# Patient Record
Sex: Female | Born: 1960
Health system: Southern US, Community
[De-identification: ages and names within clinical notes are randomized; demographics above are authoritative.]

## PROBLEM LIST (undated history)

## (undated) DIAGNOSIS — I509 Heart failure, unspecified: Secondary | ICD-10-CM

## (undated) DIAGNOSIS — N281 Cyst of kidney, acquired: Secondary | ICD-10-CM

## (undated) DIAGNOSIS — F319 Bipolar disorder, unspecified: Secondary | ICD-10-CM

## (undated) DIAGNOSIS — M549 Dorsalgia, unspecified: Secondary | ICD-10-CM

## (undated) DIAGNOSIS — G459 Transient cerebral ischemic attack, unspecified: Secondary | ICD-10-CM

## (undated) DIAGNOSIS — I252 Old myocardial infarction: Secondary | ICD-10-CM

## (undated) DIAGNOSIS — F4310 Post-traumatic stress disorder, unspecified: Secondary | ICD-10-CM

## (undated) DIAGNOSIS — I10 Essential (primary) hypertension: Secondary | ICD-10-CM

## (undated) DIAGNOSIS — F419 Anxiety disorder, unspecified: Secondary | ICD-10-CM

## (undated) DIAGNOSIS — C801 Malignant (primary) neoplasm, unspecified: Secondary | ICD-10-CM

## (undated) DIAGNOSIS — M5134 Other intervertebral disc degeneration, thoracic region: Secondary | ICD-10-CM

## (undated) DIAGNOSIS — M5124 Other intervertebral disc displacement, thoracic region: Secondary | ICD-10-CM

## (undated) DIAGNOSIS — D649 Anemia, unspecified: Secondary | ICD-10-CM

## (undated) DIAGNOSIS — I2510 Atherosclerotic heart disease of native coronary artery without angina pectoris: Secondary | ICD-10-CM

## (undated) DIAGNOSIS — F329 Major depressive disorder, single episode, unspecified: Secondary | ICD-10-CM

## (undated) DIAGNOSIS — E785 Hyperlipidemia, unspecified: Secondary | ICD-10-CM

## (undated) DIAGNOSIS — Z87448 Personal history of other diseases of urinary system: Secondary | ICD-10-CM

## (undated) DIAGNOSIS — F1910 Other psychoactive substance abuse, uncomplicated: Secondary | ICD-10-CM

## (undated) DIAGNOSIS — F32A Depression, unspecified: Secondary | ICD-10-CM

## (undated) DIAGNOSIS — D259 Leiomyoma of uterus, unspecified: Secondary | ICD-10-CM

## (undated) DIAGNOSIS — K589 Irritable bowel syndrome without diarrhea: Secondary | ICD-10-CM

## (undated) HISTORY — DX: Cyst of kidney, acquired: N28.1

## (undated) HISTORY — DX: Anxiety disorder, unspecified: F41.9

## (undated) HISTORY — PX: COLONOSCOPY: SHX174

## (undated) HISTORY — DX: Leiomyoma of uterus, unspecified: D25.9

## (undated) HISTORY — DX: Other psychoactive substance abuse, uncomplicated: F19.10

## (undated) HISTORY — PX: OPEN REDUCTION INTERNAL FIXATION (ORIF) TIBIA/FIBULA FRACTURE: SHX5992

## (undated) HISTORY — PX: VIDEO ASSISTED THORACOSCOPY: SHX5073

## (undated) HISTORY — DX: Hyperlipidemia, unspecified: E78.5

## (undated) HISTORY — DX: Major depressive disorder, single episode, unspecified: F32.9

## (undated) HISTORY — DX: Atherosclerotic heart disease of native coronary artery without angina pectoris: I25.10

## (undated) HISTORY — PX: CARDIAC CATHETERIZATION: SHX172

## (undated) HISTORY — DX: Old myocardial infarction: I25.2

## (undated) HISTORY — DX: Dorsalgia, unspecified: M54.9

## (undated) HISTORY — DX: Transient cerebral ischemic attack, unspecified: G45.9

## (undated) HISTORY — DX: Personal history of other diseases of urinary system: Z87.448

## (undated) HISTORY — PX: LOBECTOMY: SHX5089

## (undated) HISTORY — DX: Depression, unspecified: F32.A

## (undated) HISTORY — PX: INCISION AND DRAINAGE OF WOUND: SHX1803

## (undated) HISTORY — PX: DILATION AND CURETTAGE OF UTERUS: SHX78

## (undated) HISTORY — PX: ORIF TIBIA FRACTURE: SHX5416

## (undated) HISTORY — DX: Essential (primary) hypertension: I10

---

## 2002-04-14 ENCOUNTER — Emergency Department (HOSPITAL_COMMUNITY): Admission: EM | Admit: 2002-04-14 | Discharge: 2002-04-14 | Payer: Self-pay | Admitting: Emergency Medicine

## 2002-04-14 ENCOUNTER — Encounter: Payer: Self-pay | Admitting: Emergency Medicine

## 2002-06-30 ENCOUNTER — Emergency Department (HOSPITAL_COMMUNITY): Admission: EM | Admit: 2002-06-30 | Discharge: 2002-07-01 | Payer: Self-pay | Admitting: Emergency Medicine

## 2002-08-08 ENCOUNTER — Emergency Department (HOSPITAL_COMMUNITY): Admission: EM | Admit: 2002-08-08 | Discharge: 2002-08-08 | Payer: Self-pay | Admitting: Emergency Medicine

## 2002-11-20 ENCOUNTER — Emergency Department (HOSPITAL_COMMUNITY): Admission: EM | Admit: 2002-11-20 | Discharge: 2002-11-20 | Payer: Self-pay | Admitting: Emergency Medicine

## 2002-12-14 ENCOUNTER — Emergency Department (HOSPITAL_COMMUNITY): Admission: EM | Admit: 2002-12-14 | Discharge: 2002-12-14 | Payer: Self-pay | Admitting: Emergency Medicine

## 2003-02-08 ENCOUNTER — Emergency Department (HOSPITAL_COMMUNITY): Admission: EM | Admit: 2003-02-08 | Discharge: 2003-02-08 | Payer: Self-pay | Admitting: Emergency Medicine

## 2003-03-23 ENCOUNTER — Emergency Department (HOSPITAL_COMMUNITY): Admission: EM | Admit: 2003-03-23 | Discharge: 2003-03-23 | Payer: Self-pay | Admitting: Emergency Medicine

## 2003-04-13 ENCOUNTER — Emergency Department (HOSPITAL_COMMUNITY): Admission: EM | Admit: 2003-04-13 | Discharge: 2003-04-14 | Payer: Self-pay | Admitting: Emergency Medicine

## 2003-05-22 ENCOUNTER — Encounter: Admission: RE | Admit: 2003-05-22 | Discharge: 2003-05-22 | Payer: Self-pay | Admitting: Internal Medicine

## 2003-06-05 ENCOUNTER — Encounter: Admission: RE | Admit: 2003-06-05 | Discharge: 2003-06-05 | Payer: Self-pay | Admitting: Internal Medicine

## 2003-06-20 ENCOUNTER — Encounter: Admission: RE | Admit: 2003-06-20 | Discharge: 2003-06-20 | Payer: Self-pay | Admitting: Internal Medicine

## 2003-06-26 ENCOUNTER — Ambulatory Visit (HOSPITAL_COMMUNITY): Admission: RE | Admit: 2003-06-26 | Discharge: 2003-06-26 | Payer: Self-pay | Admitting: Internal Medicine

## 2003-06-26 ENCOUNTER — Encounter: Admission: RE | Admit: 2003-06-26 | Discharge: 2003-06-26 | Payer: Self-pay | Admitting: Internal Medicine

## 2003-07-12 ENCOUNTER — Inpatient Hospital Stay (HOSPITAL_COMMUNITY): Admission: EM | Admit: 2003-07-12 | Discharge: 2003-07-15 | Payer: Self-pay | Admitting: Emergency Medicine

## 2003-07-23 ENCOUNTER — Encounter: Admission: RE | Admit: 2003-07-23 | Discharge: 2003-07-23 | Payer: Self-pay | Admitting: Internal Medicine

## 2003-10-08 ENCOUNTER — Emergency Department (HOSPITAL_COMMUNITY): Admission: EM | Admit: 2003-10-08 | Discharge: 2003-10-08 | Payer: Self-pay | Admitting: Emergency Medicine

## 2003-12-28 ENCOUNTER — Emergency Department (HOSPITAL_COMMUNITY): Admission: EM | Admit: 2003-12-28 | Discharge: 2003-12-29 | Payer: Self-pay | Admitting: *Deleted

## 2004-03-26 ENCOUNTER — Emergency Department (HOSPITAL_COMMUNITY): Admission: EM | Admit: 2004-03-26 | Discharge: 2004-03-27 | Payer: Self-pay | Admitting: Emergency Medicine

## 2004-10-30 ENCOUNTER — Emergency Department (HOSPITAL_COMMUNITY): Admission: EM | Admit: 2004-10-30 | Discharge: 2004-10-30 | Payer: Self-pay | Admitting: Emergency Medicine

## 2005-01-19 ENCOUNTER — Emergency Department (HOSPITAL_COMMUNITY): Admission: EM | Admit: 2005-01-19 | Discharge: 2005-01-19 | Payer: Self-pay | Admitting: *Deleted

## 2005-03-07 ENCOUNTER — Emergency Department (HOSPITAL_COMMUNITY): Admission: EM | Admit: 2005-03-07 | Discharge: 2005-03-08 | Payer: Self-pay | Admitting: Emergency Medicine
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
## 2005-10-22 ENCOUNTER — Emergency Department (HOSPITAL_COMMUNITY): Admission: EM | Admit: 2005-10-22 | Discharge: 2005-10-22 | Payer: Self-pay | Admitting: Emergency Medicine

## 2005-11-11 HISTORY — PX: INCISION AND DRAINAGE OF WOUND: SHX1803
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
## 2005-12-07 ENCOUNTER — Emergency Department (HOSPITAL_COMMUNITY): Admission: EM | Admit: 2005-12-07 | Discharge: 2005-12-07 | Payer: Self-pay | Admitting: Emergency Medicine
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
## 2005-12-09 ENCOUNTER — Inpatient Hospital Stay (HOSPITAL_COMMUNITY): Admission: EM | Admit: 2005-12-09 | Discharge: 2005-12-11 | Payer: Self-pay | Admitting: Emergency Medicine

## 2005-12-17 ENCOUNTER — Ambulatory Visit: Payer: Self-pay | Admitting: Hospitalist

## 2005-12-31 ENCOUNTER — Ambulatory Visit: Payer: Self-pay | Admitting: Internal Medicine

## 2006-03-05 IMAGING — CT CT PELVIS W/O CM
1 series · 15 of 32 positions shown, 19 images · non-contrast
Comparison: 07/12/03.

CLINICAL DATA: Right lower quadrant pain.
 ABDOMEN AND PELVIC CT WITHOUT CONTRAST 10/08/03
TECHNIQUE: The study was done with kidney stone protocol consisting of thinly collimated helical images through the urinary tract without oral or IV contrast.

[Series 2: renal stone · axial · 0.57mm/px · z∈[-412,-112]mm · 15 of 68 slices shown, 19 images]
[im 5/68  soft-tissue]
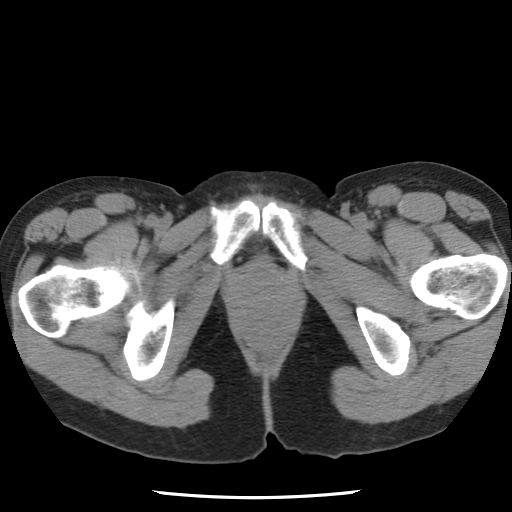
[im 5/68  bone]
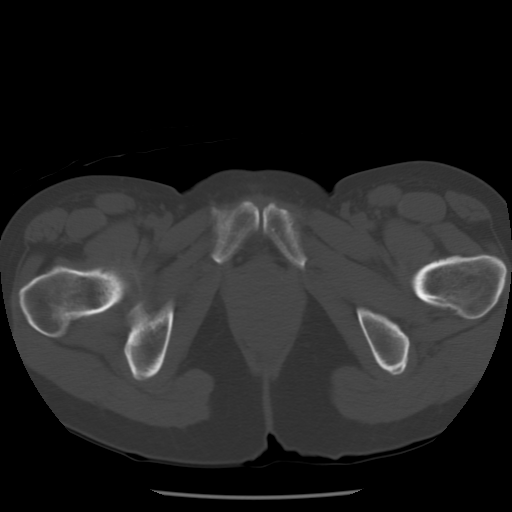
[im 9/68  soft-tissue]
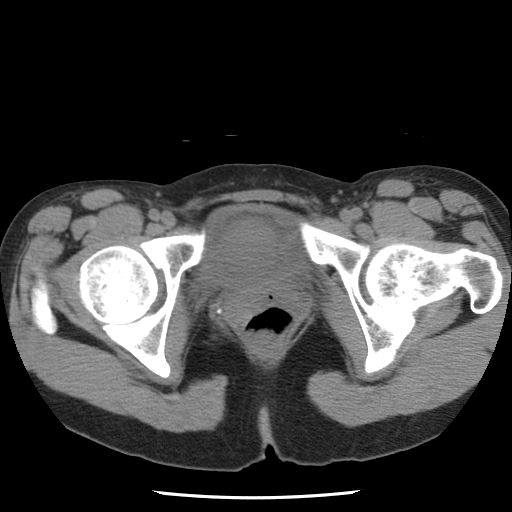
[im 13/68  soft-tissue]
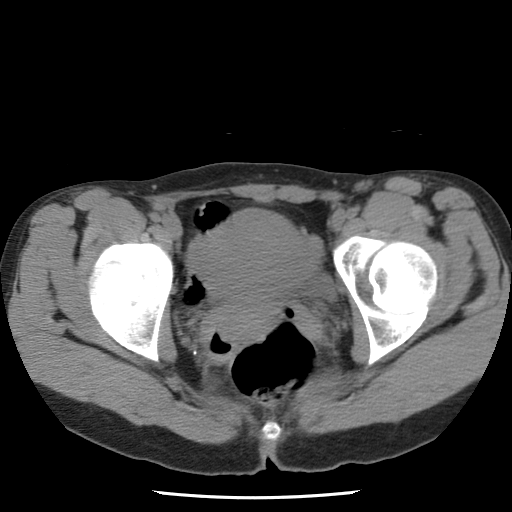
[im 20/68  soft-tissue]
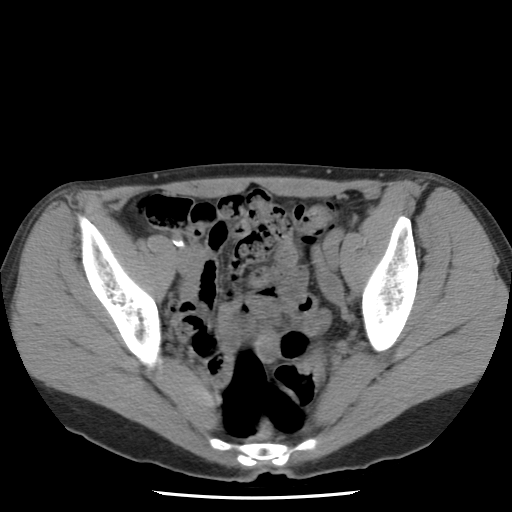
[im 24/68  soft-tissue]
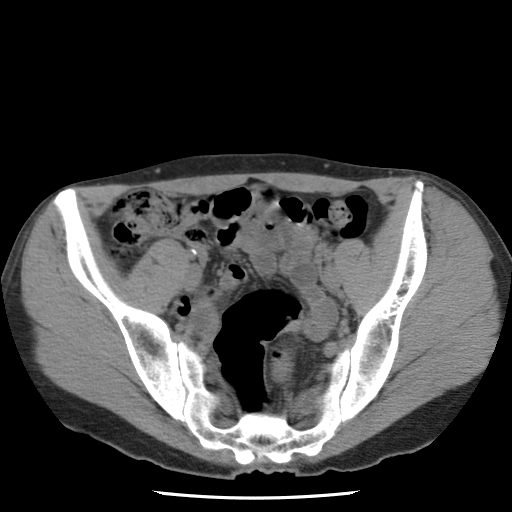
[im 29/68  soft-tissue]
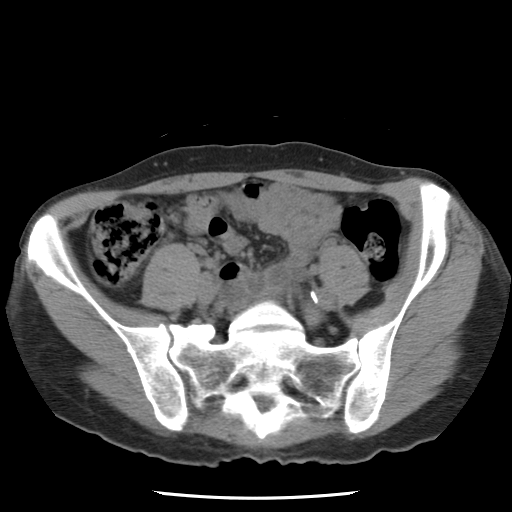
[im 35/68  soft-tissue]
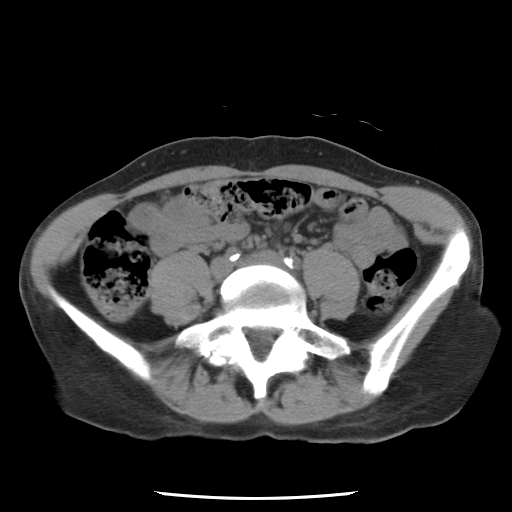
[im 39/68  soft-tissue]
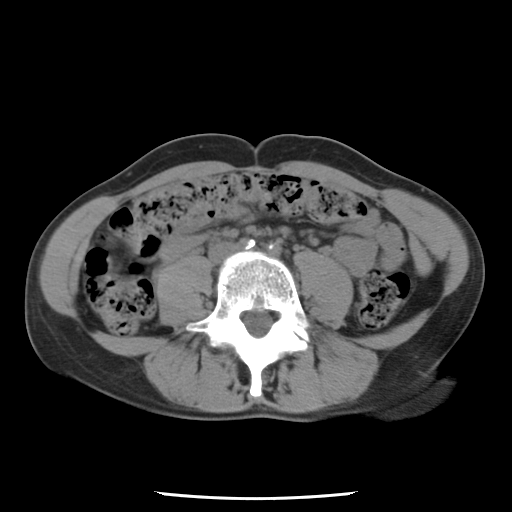
[im 44/68  soft-tissue]
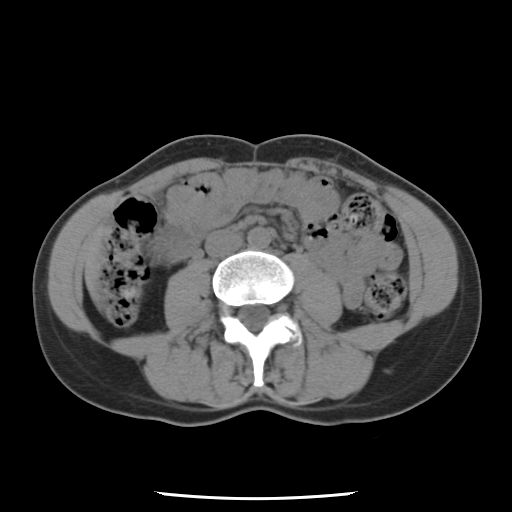
[im 44/68  bone]
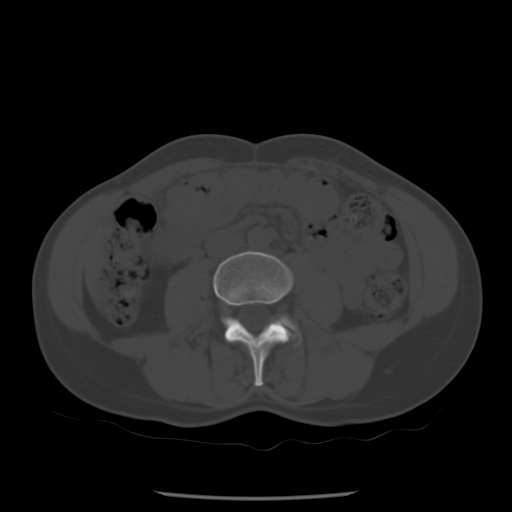
[im 48/68  soft-tissue]
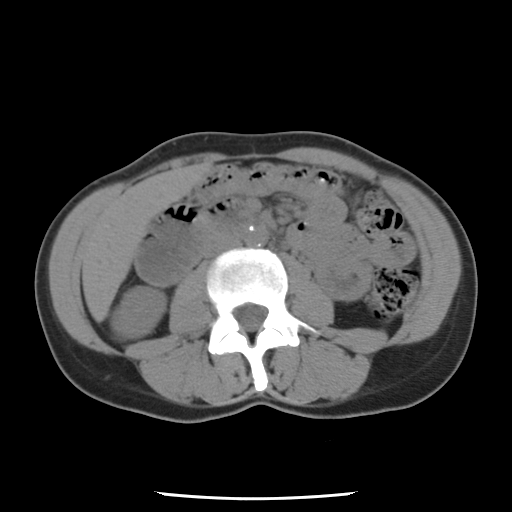
[im 55/68  soft-tissue]
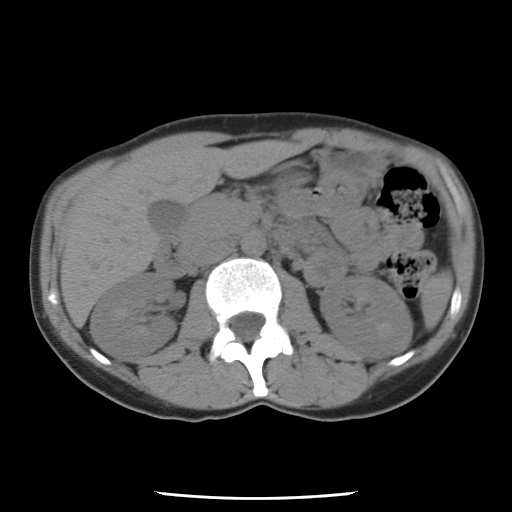
[im 59/68  soft-tissue]
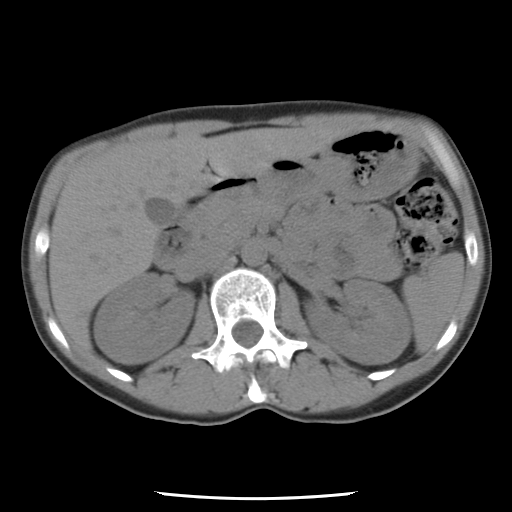
[im 59/68  lung]
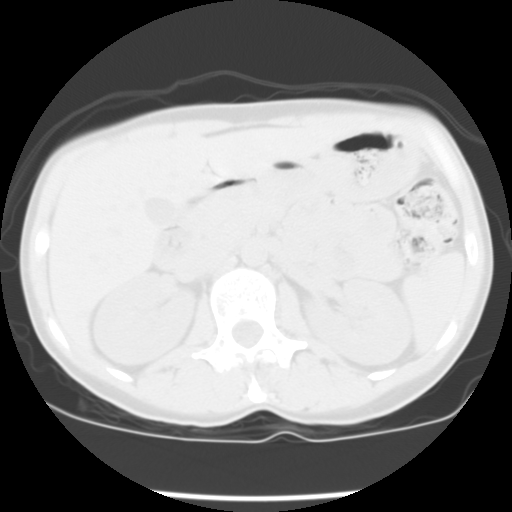
[im 61/68  lung]
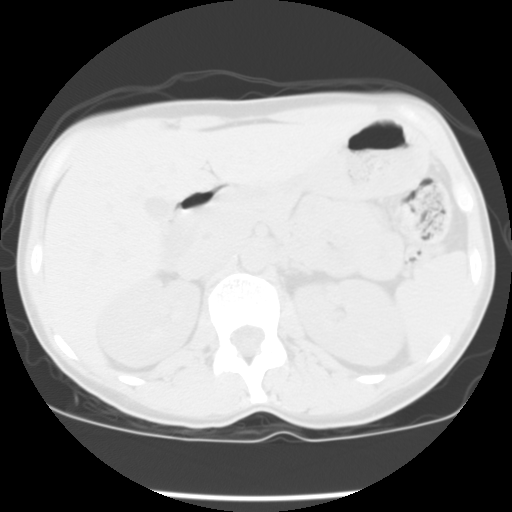
[im 63/68  soft-tissue]
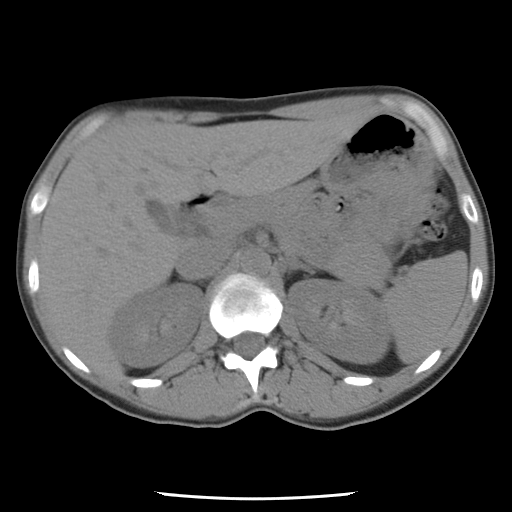
[im 63/68  lung]
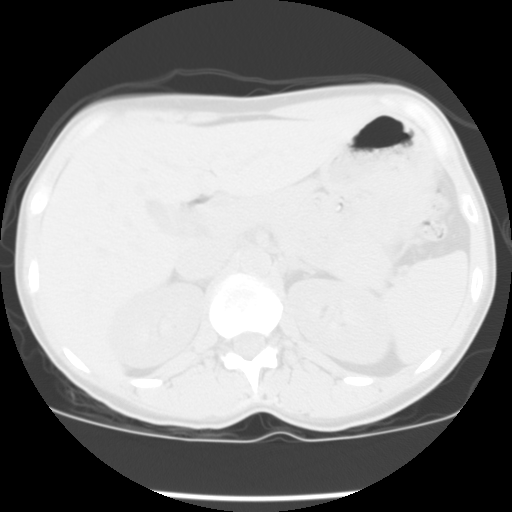
[im 65/68  lung]
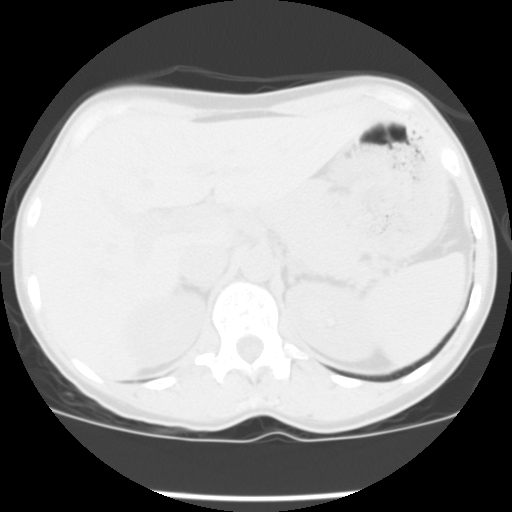

[15 of 32 positions shown; findings below may reference images not displayed]

ABDOMEN WITHOUT CONTRAST 
 No renal stones.  No hydronephrosis or hydroureter.  Inflammatory changes around the lower pole of the right kidney have resolved since the prior study.  No calcified gallstones.  Other abdominal viscera unremarkable, given the limitations of scanning without oral or IV contrast.  
 There is aortoiliac calcification. 
 IMPRESSION
 1.  No acute or specific findings.  Specifically, no evidence for renal calculi. 
 2.  Inflammatory changes of the right kidney have resolved since the prior study.
 PELVIS WITHOUT CONTRAST 
 No renal calculi or bladder stones.  No ureteral dilatation.  No bladder calculi.  No pelvic masses or fluid collections.  I do not visualize the patient?s appendix, but there are no inflammatory changes in the region of the appendix.  This can be a difficult diagnosis to make without oral or IV contrast, but I would have a low index of suspicion for appendicitis. 
 IMPRESSION
 No acute or specific findings.

## 2006-03-21 ENCOUNTER — Emergency Department (HOSPITAL_COMMUNITY): Admission: EM | Admit: 2006-03-21 | Discharge: 2006-03-21 | Payer: Self-pay | Admitting: Emergency Medicine

## 2006-04-13 DIAGNOSIS — M549 Dorsalgia, unspecified: Secondary | ICD-10-CM

## 2006-04-13 HISTORY — DX: Dorsalgia, unspecified: M54.9

## 2006-08-04 ENCOUNTER — Emergency Department (HOSPITAL_COMMUNITY): Admission: EM | Admit: 2006-08-04 | Discharge: 2006-08-04 | Payer: Self-pay | Admitting: Emergency Medicine

## 2006-09-13 ENCOUNTER — Ambulatory Visit: Payer: Self-pay | Admitting: Internal Medicine

## 2006-09-13 ENCOUNTER — Encounter (INDEPENDENT_AMBULATORY_CARE_PROVIDER_SITE_OTHER): Payer: Self-pay | Admitting: Dermatology

## 2006-09-13 DIAGNOSIS — F419 Anxiety disorder, unspecified: Secondary | ICD-10-CM | POA: Insufficient documentation

## 2006-09-13 DIAGNOSIS — F17200 Nicotine dependence, unspecified, uncomplicated: Secondary | ICD-10-CM

## 2006-09-13 DIAGNOSIS — L0390 Cellulitis, unspecified: Secondary | ICD-10-CM

## 2006-09-13 DIAGNOSIS — F329 Major depressive disorder, single episode, unspecified: Secondary | ICD-10-CM

## 2006-09-13 DIAGNOSIS — M5430 Sciatica, unspecified side: Secondary | ICD-10-CM

## 2006-09-13 DIAGNOSIS — F411 Generalized anxiety disorder: Secondary | ICD-10-CM | POA: Insufficient documentation

## 2006-09-13 DIAGNOSIS — Z87448 Personal history of other diseases of urinary system: Secondary | ICD-10-CM | POA: Insufficient documentation

## 2006-09-13 DIAGNOSIS — L0291 Cutaneous abscess, unspecified: Secondary | ICD-10-CM | POA: Insufficient documentation

## 2006-09-13 DIAGNOSIS — I10 Essential (primary) hypertension: Secondary | ICD-10-CM

## 2006-09-13 LAB — CONVERTED CEMR LAB
CO2: 23 meq/L (ref 19–32)
Chloride: 107 meq/L (ref 96–112)
Sodium: 138 meq/L (ref 135–145)

## 2006-09-20 ENCOUNTER — Telehealth (INDEPENDENT_AMBULATORY_CARE_PROVIDER_SITE_OTHER): Payer: Self-pay | Admitting: *Deleted

## 2006-10-26 ENCOUNTER — Telehealth: Payer: Self-pay | Admitting: *Deleted

## 2006-11-08 ENCOUNTER — Ambulatory Visit: Payer: Self-pay | Admitting: Internal Medicine

## 2006-11-08 ENCOUNTER — Encounter (INDEPENDENT_AMBULATORY_CARE_PROVIDER_SITE_OTHER): Payer: Self-pay | Admitting: Internal Medicine

## 2006-11-08 DIAGNOSIS — G8929 Other chronic pain: Secondary | ICD-10-CM

## 2006-11-08 DIAGNOSIS — M549 Dorsalgia, unspecified: Secondary | ICD-10-CM

## 2006-11-08 LAB — CONVERTED CEMR LAB
Basophils Absolute: 0 10*3/uL (ref 0.0–0.1)
CO2: 20 meq/L (ref 19–32)
Calcium: 9.4 mg/dL (ref 8.4–10.5)
Creatinine, Ser: 0.69 mg/dL (ref 0.40–1.20)
Eosinophils Relative: 3 % (ref 0–5)
FSH: 4.6 milliintl units/mL
HCT: 41.2 % (ref 36.0–46.0)
Hemoglobin: 13.7 g/dL (ref 12.0–15.0)
Lymphocytes Relative: 27 % (ref 12–46)
MCHC: 33.3 g/dL (ref 30.0–36.0)
Monocytes Absolute: 0.5 10*3/uL (ref 0.2–0.7)
RDW: 14.2 % — ABNORMAL HIGH (ref 11.5–14.0)

## 2006-11-10 ENCOUNTER — Ambulatory Visit (HOSPITAL_COMMUNITY): Admission: RE | Admit: 2006-11-10 | Discharge: 2006-11-10 | Payer: Self-pay | Admitting: Internal Medicine

## 2006-11-10 ENCOUNTER — Encounter (INDEPENDENT_AMBULATORY_CARE_PROVIDER_SITE_OTHER): Payer: Self-pay | Admitting: Internal Medicine

## 2006-11-13 ENCOUNTER — Emergency Department (HOSPITAL_COMMUNITY): Admission: EM | Admit: 2006-11-13 | Discharge: 2006-11-13 | Payer: Self-pay | Admitting: Emergency Medicine

## 2006-11-22 ENCOUNTER — Ambulatory Visit: Payer: Self-pay | Admitting: Infectious Disease

## 2006-11-22 ENCOUNTER — Encounter (INDEPENDENT_AMBULATORY_CARE_PROVIDER_SITE_OTHER): Payer: Self-pay | Admitting: Internal Medicine

## 2006-12-02 ENCOUNTER — Emergency Department (HOSPITAL_COMMUNITY): Admission: EM | Admit: 2006-12-02 | Discharge: 2006-12-02 | Payer: Self-pay | Admitting: Emergency Medicine

## 2006-12-07 ENCOUNTER — Telehealth: Payer: Self-pay | Admitting: *Deleted

## 2006-12-18 ENCOUNTER — Emergency Department (HOSPITAL_COMMUNITY): Admission: EM | Admit: 2006-12-18 | Discharge: 2006-12-18 | Payer: Self-pay | Admitting: Emergency Medicine

## 2006-12-20 ENCOUNTER — Encounter (INDEPENDENT_AMBULATORY_CARE_PROVIDER_SITE_OTHER): Payer: Self-pay | Admitting: Internal Medicine

## 2006-12-27 ENCOUNTER — Encounter (INDEPENDENT_AMBULATORY_CARE_PROVIDER_SITE_OTHER): Payer: Self-pay | Admitting: Internal Medicine

## 2006-12-27 ENCOUNTER — Ambulatory Visit: Payer: Self-pay | Admitting: Internal Medicine

## 2006-12-29 ENCOUNTER — Encounter: Payer: Self-pay | Admitting: Obstetrics and Gynecology

## 2006-12-29 ENCOUNTER — Ambulatory Visit: Payer: Self-pay | Admitting: Obstetrics and Gynecology

## 2007-01-03 ENCOUNTER — Telehealth: Payer: Self-pay | Admitting: *Deleted

## 2007-01-12 ENCOUNTER — Telehealth: Payer: Self-pay | Admitting: *Deleted

## 2007-01-17 ENCOUNTER — Ambulatory Visit (HOSPITAL_COMMUNITY): Admission: RE | Admit: 2007-01-17 | Discharge: 2007-01-17 | Payer: Self-pay | Admitting: Obstetrics and Gynecology

## 2007-02-01 ENCOUNTER — Telehealth: Payer: Self-pay | Admitting: *Deleted

## 2007-02-15 ENCOUNTER — Encounter (INDEPENDENT_AMBULATORY_CARE_PROVIDER_SITE_OTHER): Payer: Self-pay | Admitting: *Deleted

## 2007-02-15 ENCOUNTER — Ambulatory Visit: Payer: Self-pay | Admitting: Internal Medicine

## 2007-02-15 LAB — CONVERTED CEMR LAB: Beta hcg, urine, semiquantitative: NEGATIVE

## 2007-03-17 ENCOUNTER — Telehealth: Payer: Self-pay | Admitting: *Deleted

## 2007-03-22 ENCOUNTER — Telehealth (INDEPENDENT_AMBULATORY_CARE_PROVIDER_SITE_OTHER): Payer: Self-pay | Admitting: Internal Medicine

## 2007-03-23 ENCOUNTER — Ambulatory Visit: Payer: Self-pay | Admitting: Internal Medicine

## 2007-03-23 ENCOUNTER — Ambulatory Visit: Payer: Self-pay | Admitting: Obstetrics and Gynecology

## 2007-03-23 ENCOUNTER — Encounter (INDEPENDENT_AMBULATORY_CARE_PROVIDER_SITE_OTHER): Payer: Self-pay | Admitting: Internal Medicine

## 2007-03-23 LAB — CONVERTED CEMR LAB
CO2: 21 meq/L (ref 19–32)
Chloride: 103 meq/L (ref 96–112)
Creatinine, Ser: 0.79 mg/dL (ref 0.40–1.20)
Potassium: 4.2 meq/L (ref 3.5–5.3)
Sodium: 138 meq/L (ref 135–145)

## 2007-03-30 ENCOUNTER — Telehealth: Payer: Self-pay | Admitting: *Deleted

## 2007-04-22 ENCOUNTER — Telehealth (INDEPENDENT_AMBULATORY_CARE_PROVIDER_SITE_OTHER): Payer: Self-pay | Admitting: Internal Medicine

## 2007-06-01 ENCOUNTER — Ambulatory Visit: Payer: Self-pay | Admitting: Internal Medicine

## 2007-06-01 ENCOUNTER — Encounter (INDEPENDENT_AMBULATORY_CARE_PROVIDER_SITE_OTHER): Payer: Self-pay | Admitting: *Deleted
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
## 2007-06-19 ENCOUNTER — Emergency Department (HOSPITAL_COMMUNITY): Admission: EM | Admit: 2007-06-19 | Discharge: 2007-06-19 | Payer: Self-pay | Admitting: Emergency Medicine

## 2007-07-26 ENCOUNTER — Ambulatory Visit: Payer: Self-pay | Admitting: Infectious Disease

## 2007-07-26 ENCOUNTER — Inpatient Hospital Stay (HOSPITAL_COMMUNITY): Admission: AD | Admit: 2007-07-26 | Discharge: 2007-07-28 | Payer: Self-pay | Admitting: Infectious Diseases

## 2007-07-26 ENCOUNTER — Encounter: Admission: RE | Admit: 2007-07-26 | Discharge: 2007-07-26 | Payer: Self-pay | Admitting: Infectious Disease

## 2007-07-26 ENCOUNTER — Ambulatory Visit: Payer: Self-pay | Admitting: Infectious Diseases

## 2007-08-01 ENCOUNTER — Encounter: Admission: RE | Admit: 2007-08-01 | Discharge: 2007-08-01 | Payer: Self-pay | Admitting: Dentistry

## 2007-08-01 ENCOUNTER — Ambulatory Visit: Payer: Self-pay | Admitting: Dentistry

## 2007-08-10 ENCOUNTER — Telehealth: Payer: Self-pay | Admitting: *Deleted

## 2007-09-06 ENCOUNTER — Ambulatory Visit: Payer: Self-pay | Admitting: *Deleted

## 2007-09-06 ENCOUNTER — Encounter: Payer: Self-pay | Admitting: *Deleted

## 2007-09-06 ENCOUNTER — Encounter: Payer: Self-pay | Admitting: Licensed Clinical Social Worker

## 2007-09-29 ENCOUNTER — Ambulatory Visit: Payer: Self-pay | Admitting: Internal Medicine

## 2007-09-29 ENCOUNTER — Encounter (INDEPENDENT_AMBULATORY_CARE_PROVIDER_SITE_OTHER): Payer: Self-pay | Admitting: Internal Medicine

## 2007-10-05 ENCOUNTER — Encounter (INDEPENDENT_AMBULATORY_CARE_PROVIDER_SITE_OTHER): Payer: Self-pay | Admitting: Internal Medicine

## 2007-10-31 ENCOUNTER — Ambulatory Visit: Payer: Self-pay | Admitting: Infectious Diseases

## 2007-12-01 ENCOUNTER — Ambulatory Visit: Payer: Self-pay | Admitting: Internal Medicine

## 2007-12-01 ENCOUNTER — Encounter (INDEPENDENT_AMBULATORY_CARE_PROVIDER_SITE_OTHER): Payer: Self-pay | Admitting: Internal Medicine

## 2007-12-01 ENCOUNTER — Observation Stay (HOSPITAL_COMMUNITY): Admission: AD | Admit: 2007-12-01 | Discharge: 2007-12-02 | Payer: Self-pay | Admitting: Internal Medicine

## 2007-12-01 LAB — CONVERTED CEMR LAB
AST: 18 units/L (ref 0–37)
Alkaline Phosphatase: 75 units/L (ref 39–117)
BUN: 7 mg/dL (ref 6–23)
Basophils Relative: 1 % (ref 0–1)
Creatinine, Ser: 0.75 mg/dL (ref 0.40–1.20)
Eosinophils Absolute: 0.4 10*3/uL (ref 0.0–0.7)
Eosinophils Relative: 4 % (ref 0–5)
Glucose, Bld: 131 mg/dL — ABNORMAL HIGH (ref 70–99)
HCT: 39.7 % (ref 36.0–46.0)
Hemoglobin: 13.5 g/dL (ref 12.0–15.0)
MCHC: 34.1 g/dL (ref 30.0–36.0)
MCV: 103.1 fL — ABNORMAL HIGH (ref 78.0–100.0)
Monocytes Absolute: 0.8 10*3/uL (ref 0.1–1.0)
Monocytes Relative: 9 % (ref 3–12)
Nitrite: POSITIVE — AB
RBC: 3.85 M/uL — ABNORMAL LOW (ref 3.87–5.11)
Specific Gravity, Urine: 1.042 — ABNORMAL HIGH (ref 1.005–1.03)
pH: 5 (ref 5.0–8.0)

## 2007-12-06 ENCOUNTER — Telehealth: Payer: Self-pay | Admitting: *Deleted

## 2007-12-07 ENCOUNTER — Telehealth (INDEPENDENT_AMBULATORY_CARE_PROVIDER_SITE_OTHER): Payer: Self-pay | Admitting: Internal Medicine

## 2008-01-04 ENCOUNTER — Ambulatory Visit: Payer: Self-pay | Admitting: Internal Medicine

## 2008-01-04 ENCOUNTER — Ambulatory Visit (HOSPITAL_COMMUNITY): Admission: RE | Admit: 2008-01-04 | Discharge: 2008-01-04 | Payer: Self-pay | Admitting: Internal Medicine

## 2008-01-04 ENCOUNTER — Encounter (INDEPENDENT_AMBULATORY_CARE_PROVIDER_SITE_OTHER): Payer: Self-pay | Admitting: Internal Medicine

## 2008-01-04 DIAGNOSIS — F4310 Post-traumatic stress disorder, unspecified: Secondary | ICD-10-CM

## 2008-02-03 ENCOUNTER — Encounter: Payer: Self-pay | Admitting: Licensed Clinical Social Worker

## 2008-02-03 ENCOUNTER — Ambulatory Visit: Payer: Self-pay | Admitting: Internal Medicine

## 2008-02-20 ENCOUNTER — Emergency Department (HOSPITAL_COMMUNITY): Admission: EM | Admit: 2008-02-20 | Discharge: 2008-02-20 | Payer: Self-pay | Admitting: Emergency Medicine

## 2008-02-21 ENCOUNTER — Ambulatory Visit (HOSPITAL_COMMUNITY): Admission: RE | Admit: 2008-02-21 | Discharge: 2008-02-21 | Payer: Self-pay | Admitting: Obstetrics & Gynecology

## 2008-03-02 ENCOUNTER — Telehealth: Payer: Self-pay | Admitting: *Deleted

## 2008-03-04 ENCOUNTER — Emergency Department (HOSPITAL_COMMUNITY): Admission: EM | Admit: 2008-03-04 | Discharge: 2008-03-04 | Payer: Self-pay | Admitting: *Deleted

## 2008-03-14 ENCOUNTER — Encounter (INDEPENDENT_AMBULATORY_CARE_PROVIDER_SITE_OTHER): Payer: Self-pay | Admitting: Internal Medicine

## 2008-03-14 ENCOUNTER — Ambulatory Visit: Payer: Self-pay | Admitting: Internal Medicine

## 2008-03-15 ENCOUNTER — Ambulatory Visit: Payer: Self-pay | Admitting: Obstetrics and Gynecology

## 2008-03-15 ENCOUNTER — Ambulatory Visit: Payer: Self-pay | Admitting: Internal Medicine

## 2008-03-15 ENCOUNTER — Encounter: Payer: Self-pay | Admitting: Obstetrics and Gynecology

## 2008-03-15 ENCOUNTER — Encounter (INDEPENDENT_AMBULATORY_CARE_PROVIDER_SITE_OTHER): Payer: Self-pay | Admitting: Internal Medicine

## 2008-03-15 LAB — CONVERTED CEMR LAB
CO2: 16 meq/L — ABNORMAL LOW (ref 19–32)
Calcium: 8.9 mg/dL (ref 8.4–10.5)
Creatinine, Ser: 0.8 mg/dL (ref 0.40–1.20)
Glucose, Bld: 71 mg/dL (ref 70–99)
Sodium: 138 meq/L (ref 135–145)

## 2008-03-16 LAB — CONVERTED CEMR LAB
CO2: 24 meq/L (ref 19–32)
Calcium: 9.2 mg/dL (ref 8.4–10.5)
Creatinine, Ser: 0.66 mg/dL (ref 0.40–1.20)
Sodium: 138 meq/L (ref 135–145)
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
## 2008-03-21 LAB — CONVERTED CEMR LAB: Pap Smear: NEGATIVE

## 2008-03-23 ENCOUNTER — Inpatient Hospital Stay (HOSPITAL_COMMUNITY): Admission: AD | Admit: 2008-03-23 | Discharge: 2008-03-23 | Payer: Self-pay | Admitting: Obstetrics & Gynecology

## 2008-05-01 ENCOUNTER — Telehealth: Payer: Self-pay | Admitting: *Deleted

## 2008-05-30 ENCOUNTER — Encounter (INDEPENDENT_AMBULATORY_CARE_PROVIDER_SITE_OTHER): Payer: Self-pay | Admitting: Internal Medicine

## 2008-05-30 ENCOUNTER — Ambulatory Visit (HOSPITAL_COMMUNITY): Admission: RE | Admit: 2008-05-30 | Discharge: 2008-05-30 | Payer: Self-pay | Admitting: Internal Medicine

## 2008-05-30 ENCOUNTER — Ambulatory Visit: Payer: Self-pay | Admitting: Internal Medicine

## 2008-05-30 ENCOUNTER — Encounter: Payer: Self-pay | Admitting: Licensed Clinical Social Worker

## 2008-05-30 LAB — CONVERTED CEMR LAB
CO2: 20 meq/L (ref 19–32)
Chloride: 102 meq/L (ref 96–112)
Creatinine, Ser: 0.73 mg/dL (ref 0.40–1.20)
Potassium: 3.9 meq/L (ref 3.5–5.3)
Sodium: 140 meq/L (ref 135–145)

## 2008-05-31 ENCOUNTER — Ambulatory Visit: Payer: Self-pay | Admitting: Sports Medicine

## 2008-06-14 ENCOUNTER — Ambulatory Visit: Payer: Self-pay | Admitting: Sports Medicine

## 2008-07-02 ENCOUNTER — Ambulatory Visit: Payer: Self-pay | Admitting: Internal Medicine

## 2008-08-02 ENCOUNTER — Ambulatory Visit: Payer: Self-pay | Admitting: Internal Medicine

## 2008-08-02 ENCOUNTER — Encounter: Payer: Self-pay | Admitting: *Deleted

## 2008-08-02 ENCOUNTER — Telehealth (INDEPENDENT_AMBULATORY_CARE_PROVIDER_SITE_OTHER): Payer: Self-pay | Admitting: Internal Medicine

## 2008-08-02 ENCOUNTER — Encounter (INDEPENDENT_AMBULATORY_CARE_PROVIDER_SITE_OTHER): Payer: Self-pay | Admitting: Internal Medicine

## 2008-08-02 LAB — CONVERTED CEMR LAB
Amphetamine Screen, Ur: NEGATIVE
Benzodiazepines.: NEGATIVE
Cocaine Metabolites: NEGATIVE
Marijuana Metabolite: NEGATIVE
Opiates: POSITIVE — AB
Phencyclidine (PCP): NEGATIVE
Propoxyphene: NEGATIVE

## 2008-08-08 ENCOUNTER — Ambulatory Visit: Payer: Self-pay | Admitting: Sports Medicine

## 2008-08-13 ENCOUNTER — Encounter (INDEPENDENT_AMBULATORY_CARE_PROVIDER_SITE_OTHER): Payer: Self-pay | Admitting: Internal Medicine
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
## 2008-08-23 ENCOUNTER — Emergency Department (HOSPITAL_COMMUNITY): Admission: EM | Admit: 2008-08-23 | Discharge: 2008-08-23 | Payer: Self-pay | Admitting: Emergency Medicine

## 2008-09-11 ENCOUNTER — Telehealth: Payer: Self-pay | Admitting: *Deleted

## 2008-09-13 ENCOUNTER — Encounter (INDEPENDENT_AMBULATORY_CARE_PROVIDER_SITE_OTHER): Payer: Self-pay | Admitting: Internal Medicine

## 2008-09-13 ENCOUNTER — Ambulatory Visit: Payer: Self-pay | Admitting: Internal Medicine

## 2008-09-13 ENCOUNTER — Ambulatory Visit: Payer: Self-pay | Admitting: Sports Medicine

## 2008-09-13 ENCOUNTER — Encounter (INDEPENDENT_AMBULATORY_CARE_PROVIDER_SITE_OTHER): Payer: Self-pay | Admitting: *Deleted

## 2008-09-13 LAB — CONVERTED CEMR LAB
Bilirubin Urine: NEGATIVE
Blood in Urine, dipstick: NEGATIVE
Ketones, urine, test strip: NEGATIVE
Nitrite: NEGATIVE
Protein, U semiquant: NEGATIVE
Specific Gravity, Urine: 1.01
Urobilinogen, UA: 0.2

## 2008-09-14 LAB — CONVERTED CEMR LAB
BUN: 17 mg/dL (ref 6–23)
Chloride: 106 meq/L (ref 96–112)
GFR calc Af Amer: >60 mL/min (ref >60)
Glucose, Bld: 109 mg/dL — ABNORMAL HIGH (ref 70–99)
Potassium: 4.5 meq/L (ref 3.5–5.3)
Sodium: 136 meq/L (ref 135–145)

## 2008-09-17 ENCOUNTER — Encounter (INDEPENDENT_AMBULATORY_CARE_PROVIDER_SITE_OTHER): Payer: Self-pay | Admitting: Internal Medicine

## 2008-09-17 ENCOUNTER — Ambulatory Visit: Payer: Self-pay | Admitting: Internal Medicine

## 2008-09-19 LAB — CONVERTED CEMR LAB
BUN: 18 mg/dL (ref 6–23)
CO2: 20 meq/L (ref 19–32)
Chloride: 102 meq/L (ref 96–112)
GFR calc non Af Amer: >60 mL/min (ref >60)
Potassium: 3.9 meq/L (ref 3.5–5.3)

## 2008-10-16 ENCOUNTER — Telehealth: Payer: Self-pay | Admitting: *Deleted

## 2008-10-30 ENCOUNTER — Encounter: Payer: Self-pay | Admitting: Sports Medicine

## 2008-11-06 ENCOUNTER — Observation Stay (HOSPITAL_COMMUNITY): Admission: EM | Admit: 2008-11-06 | Discharge: 2008-11-07 | Payer: Self-pay | Admitting: Emergency Medicine

## 2008-11-06 ENCOUNTER — Ambulatory Visit: Payer: Self-pay | Admitting: Internal Medicine

## 2008-11-07 ENCOUNTER — Encounter (INDEPENDENT_AMBULATORY_CARE_PROVIDER_SITE_OTHER): Payer: Self-pay | Admitting: Internal Medicine

## 2008-11-07 ENCOUNTER — Telehealth (INDEPENDENT_AMBULATORY_CARE_PROVIDER_SITE_OTHER): Payer: Self-pay | Admitting: Internal Medicine

## 2008-11-16 ENCOUNTER — Telehealth (INDEPENDENT_AMBULATORY_CARE_PROVIDER_SITE_OTHER): Payer: Self-pay | Admitting: Internal Medicine

## 2008-12-13 ENCOUNTER — Telehealth (INDEPENDENT_AMBULATORY_CARE_PROVIDER_SITE_OTHER): Payer: Self-pay | Admitting: Internal Medicine

## 2008-12-26 ENCOUNTER — Telehealth (INDEPENDENT_AMBULATORY_CARE_PROVIDER_SITE_OTHER): Payer: Self-pay | Admitting: Internal Medicine

## 2008-12-28 ENCOUNTER — Emergency Department (HOSPITAL_COMMUNITY): Admission: EM | Admit: 2008-12-28 | Discharge: 2008-12-28 | Payer: Self-pay | Admitting: Emergency Medicine

## 2008-12-28 ENCOUNTER — Telehealth (INDEPENDENT_AMBULATORY_CARE_PROVIDER_SITE_OTHER): Payer: Self-pay | Admitting: Internal Medicine

## 2009-01-02 ENCOUNTER — Telehealth (INDEPENDENT_AMBULATORY_CARE_PROVIDER_SITE_OTHER): Payer: Self-pay | Admitting: Internal Medicine

## 2009-01-08 ENCOUNTER — Ambulatory Visit: Payer: Self-pay | Admitting: Internal Medicine

## 2009-01-09 ENCOUNTER — Encounter (INDEPENDENT_AMBULATORY_CARE_PROVIDER_SITE_OTHER): Payer: Self-pay | Admitting: Internal Medicine

## 2009-01-09 LAB — CONVERTED CEMR LAB
ALT: 10 units/L (ref 0–35)
AST: 20 units/L (ref 0–37)
Albumin: 4.7 g/dL (ref 3.5–5.2)
BUN: 16 mg/dL (ref 6–23)
CO2: 20 meq/L (ref 19–32)
Calcium: 9.4 mg/dL (ref 8.4–10.5)
Chloride: 104 meq/L (ref 96–112)
Potassium: 4.7 meq/L (ref 3.5–5.3)
Sed Rate: 11 mm/hr (ref 0–22)
TSH: 1.169 microintl units/mL (ref 0.350–4.5)

## 2009-01-21 ENCOUNTER — Ambulatory Visit: Payer: Self-pay | Admitting: Internal Medicine

## 2009-02-14 ENCOUNTER — Telehealth: Payer: Self-pay | Admitting: *Deleted

## 2009-02-25 ENCOUNTER — Ambulatory Visit: Payer: Self-pay | Admitting: Internal Medicine

## 2009-03-04 ENCOUNTER — Telehealth (INDEPENDENT_AMBULATORY_CARE_PROVIDER_SITE_OTHER): Payer: Self-pay | Admitting: Internal Medicine

## 2009-03-14 ENCOUNTER — Telehealth: Payer: Self-pay | Admitting: *Deleted

## 2009-03-15 ENCOUNTER — Telehealth: Payer: Self-pay | Admitting: *Deleted

## 2009-03-19 ENCOUNTER — Telehealth: Payer: Self-pay | Admitting: *Deleted

## 2009-03-25 ENCOUNTER — Emergency Department (HOSPITAL_COMMUNITY): Admission: EM | Admit: 2009-03-25 | Discharge: 2009-03-25 | Payer: Self-pay | Admitting: Emergency Medicine

## 2009-03-26 ENCOUNTER — Telehealth (INDEPENDENT_AMBULATORY_CARE_PROVIDER_SITE_OTHER): Payer: Self-pay | Admitting: Internal Medicine

## 2009-04-03 ENCOUNTER — Emergency Department (HOSPITAL_COMMUNITY): Admission: EM | Admit: 2009-04-03 | Discharge: 2009-04-03 | Payer: Self-pay | Admitting: Emergency Medicine

## 2009-04-07 IMAGING — MR MR LUMBAR SPINE WO/W CM
4 of 9 series · 19 of 48 positions shown · IV contrast (Yes   MAGNEVIST)
Comparison: none

CLINICAL DATA: 46 year-old female with chronic back pain and numbness in right leg for several months.  History of MRSA infection one year ago.
MRI LUMBAR SPINE WITHOUT AND WITH CONTRAST:
TECHNIQUE: Multiplanar and multiecho pulse sequences of the lumbar spine, to include the lower thoracic region and upper sacral regions, were obtained according to standard protocol before and after administration of intravenous contrast.
Contrast:  10mL Magnevist.

[Series 2: T1 · sagittal · 4.0mm · 0.55mm/px · 4 of 12 slices shown (1 of 2)]
[im 1/12]
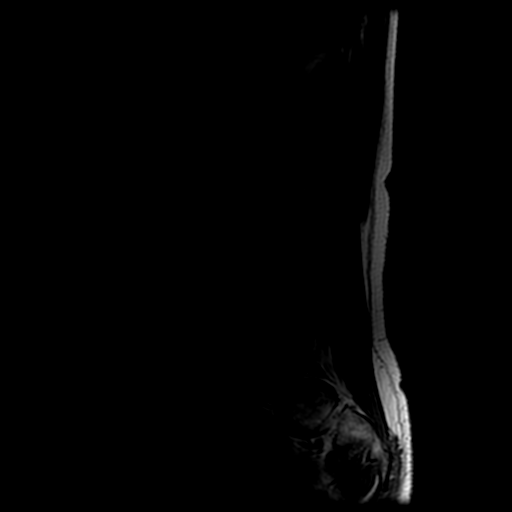
[im 4/12]
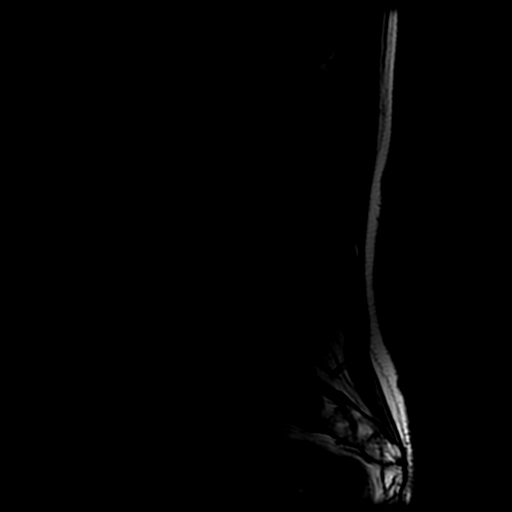
[im 8/12]
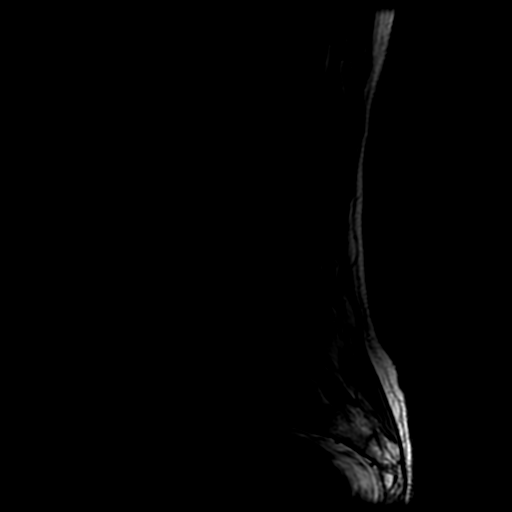
[im 12/12]
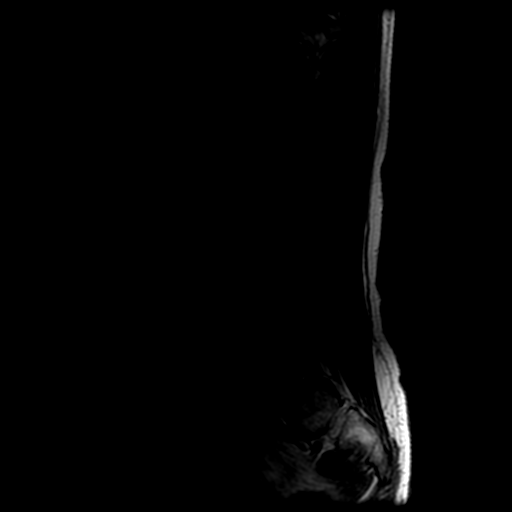

[Series 4: T2 · sagittal · 4.0mm · 0.55mm/px · 3 of 12 slices shown (1 of 2)]
[im 1/12]
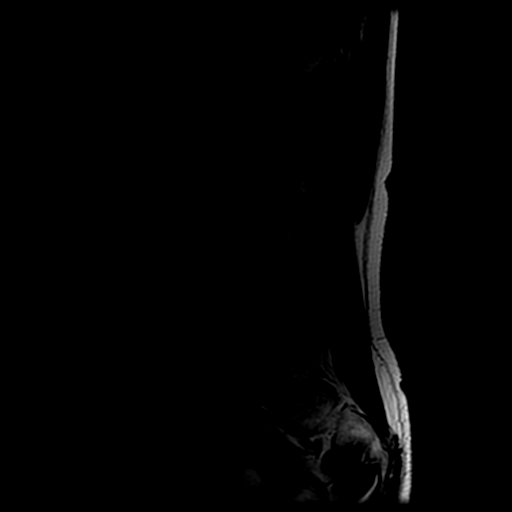
[im 6/12]
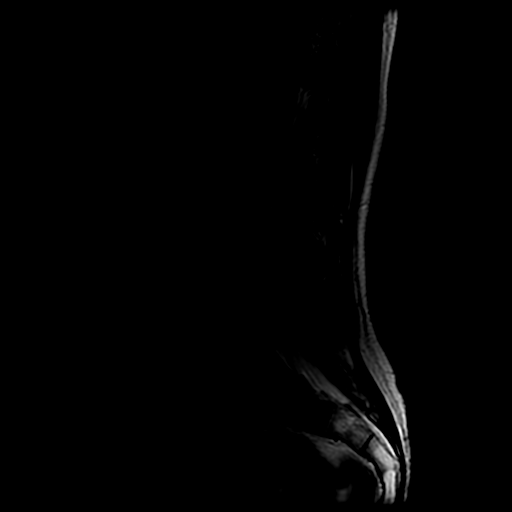
[im 12/12]
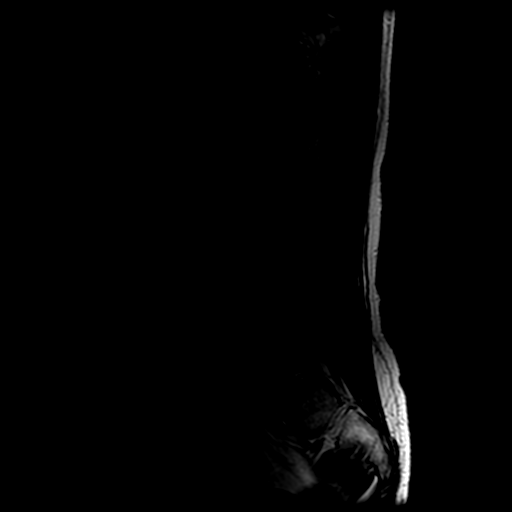

[Series 6: T2 · axial · 4.0mm · 0.35mm/px · z∈[-101,+151]mm · 7 of 26 slices shown (2 of 2)]
[im 1/26]
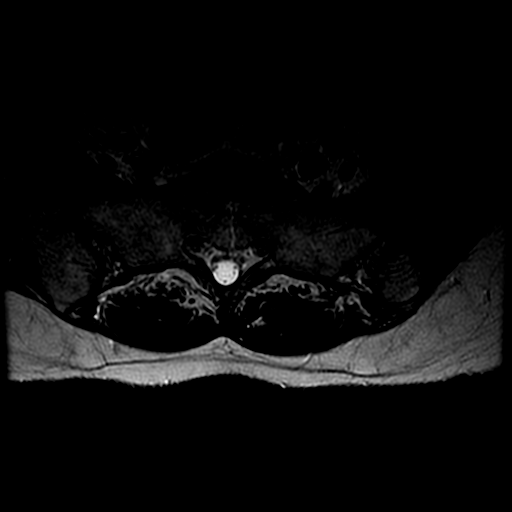
[im 5/26]
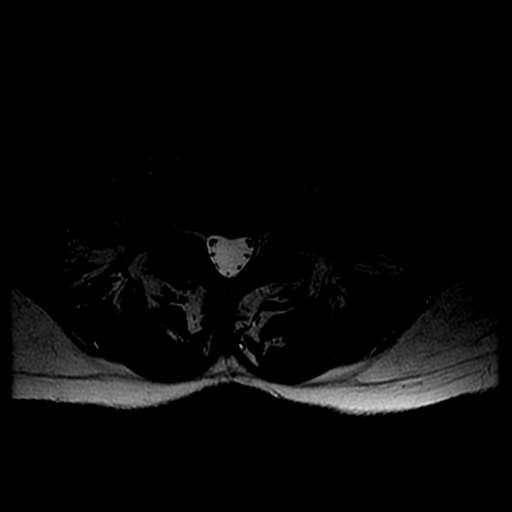
[im 9/26]
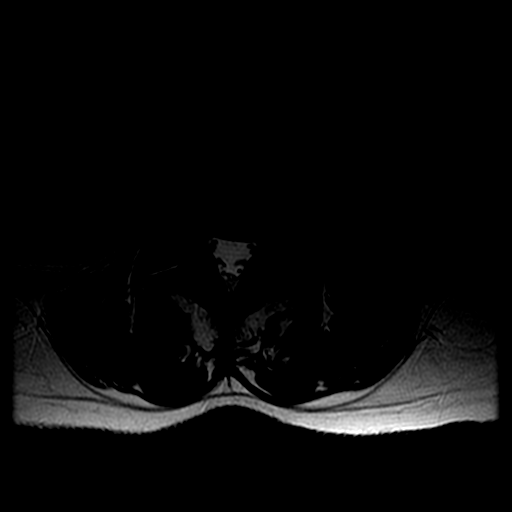
[im 13/26]
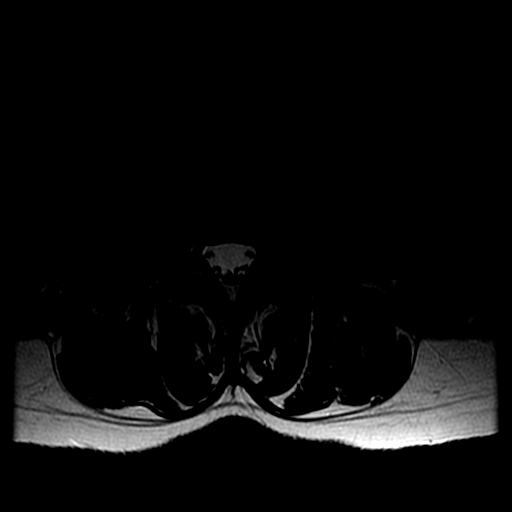
[im 17/26]
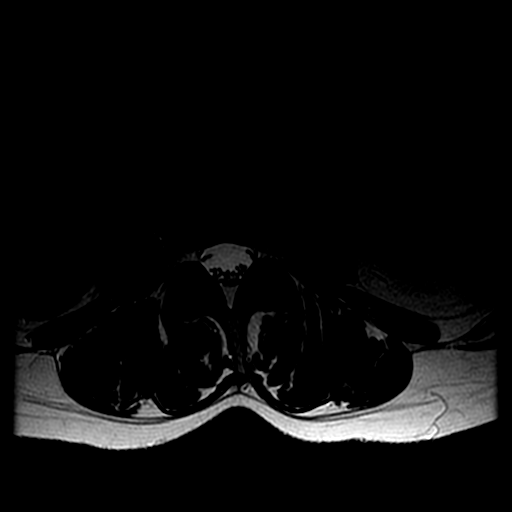
[im 21/26]
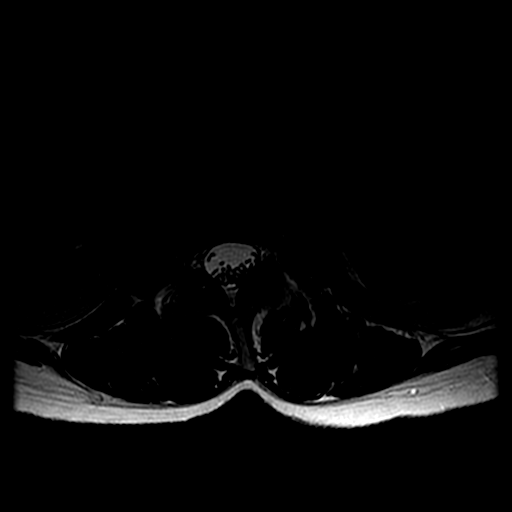
[im 26/26]
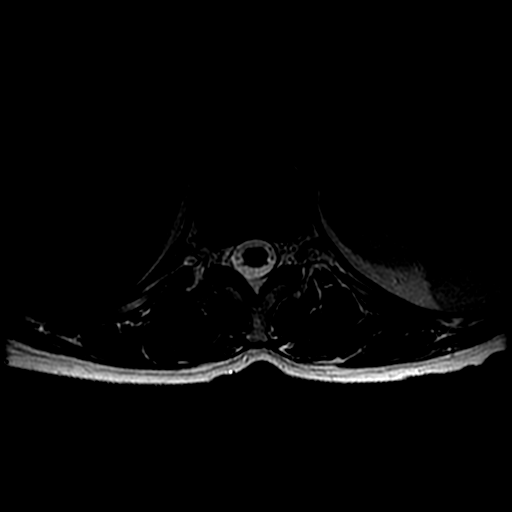

[Series 7: T1 · axial · 4.0mm · 0.35mm/px · z∈[-101,+90]mm · 5 of 26 slices shown (2 of 2)]
[im 1/26]
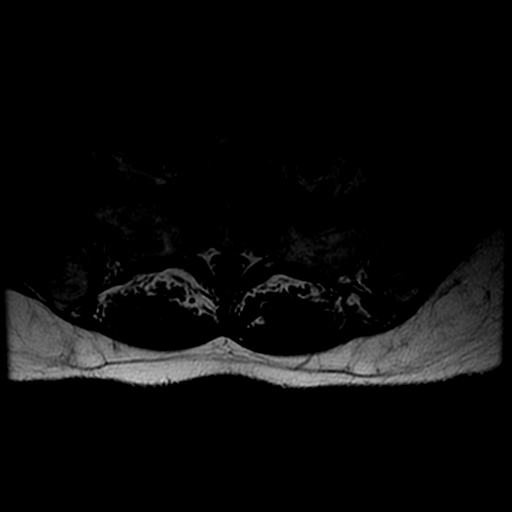
[im 5/26]
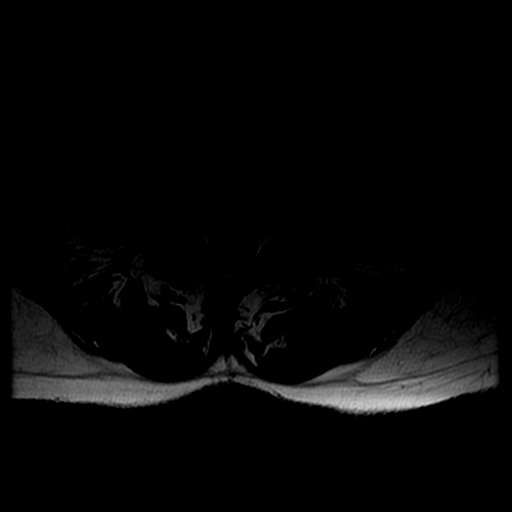
[im 9/26]
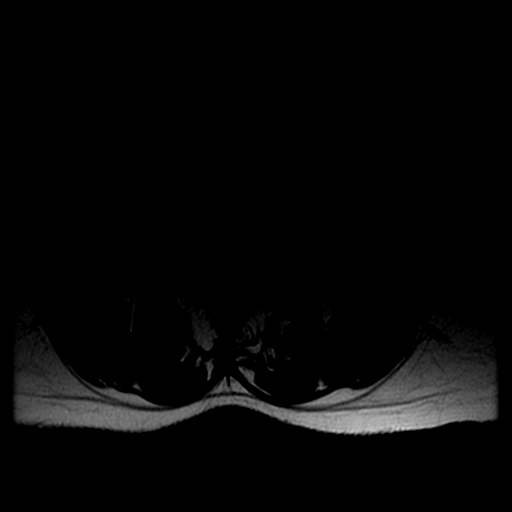
[im 13/26]
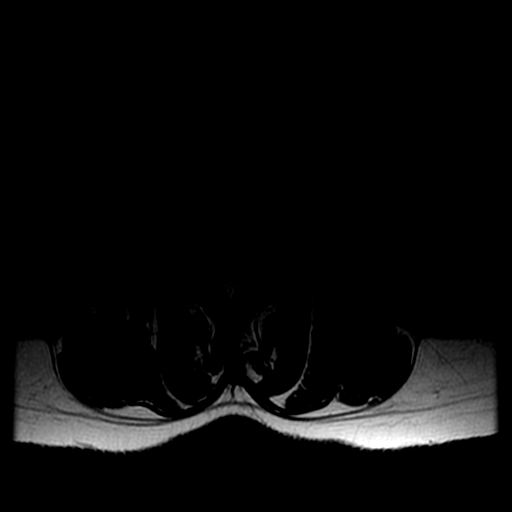
[im 21/26]
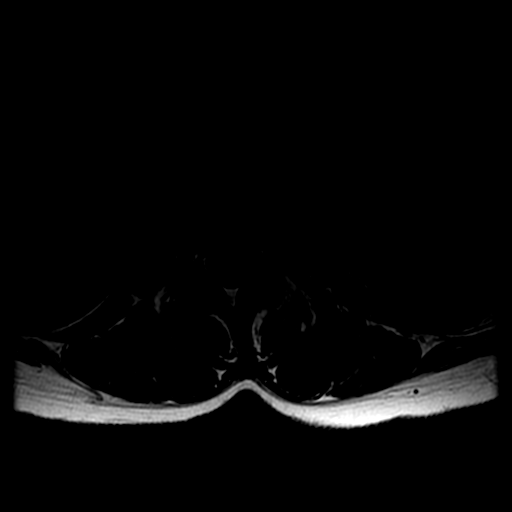

[19 of 48 positions shown; findings below may reference images not displayed]

FINDINGS: Small high T2 signal lesion in the left renal cortex laterally may represent a simple cyst but is suboptimally characterized.  Visualized paraspinal soft tissues are otherwise within normal limits.  There is normal height and alignment of lumbar vertebrae.  There is no marrow edema or suspicious marrow lesion seen.  Incidental T12 level benign hemangioma.  Normal lower thoracic spinal cord, conus medullaris located at the superior L1 level.  No abnormal intradural enhancement.  
The disc levels T11-T12 through L3-L4 are normal.  
L4-L5:  Small posterior annular tear associated with a small central protrusion without direct mass effect on the lateral recesses.  No stenosis.  There is moderate to severe right greater than left facet hypertrophy.  
L5-S1:  Normal disc, no stenosis.
IMPRESSION: 1.  Mild disc degeneration at L4 with posterior annular tear.  No significant disc protrusion.
2.  Moderate to severe L4-L5 facet and ligamentous hypertrophy.  
3.  No central spinal or neural foraminal stenosis.

## 2009-04-13 ENCOUNTER — Emergency Department (HOSPITAL_COMMUNITY): Admission: EM | Admit: 2009-04-13 | Discharge: 2009-04-13 | Payer: Self-pay | Admitting: Emergency Medicine

## 2009-04-13 HISTORY — PX: CARDIAC CATHETERIZATION: SHX172

## 2009-04-15 ENCOUNTER — Emergency Department (HOSPITAL_COMMUNITY): Admission: EM | Admit: 2009-04-15 | Discharge: 2009-04-15 | Payer: Self-pay | Admitting: Emergency Medicine

## 2009-04-18 ENCOUNTER — Ambulatory Visit: Payer: Self-pay | Admitting: Internal Medicine

## 2009-04-18 ENCOUNTER — Encounter: Payer: Self-pay | Admitting: Licensed Clinical Social Worker

## 2009-04-18 DIAGNOSIS — S2249XA Multiple fractures of ribs, unspecified side, initial encounter for closed fracture: Secondary | ICD-10-CM | POA: Insufficient documentation

## 2009-04-29 IMAGING — CR DG LUMBAR SPINE COMPLETE 4+V
5 series · 5 of 5 positions shown · non-contrast
Comparison: none

CLINICAL DATA: Fell down steps

Lumbar spine five-view:
Comparison MR 11/10/2006. Bilateral facet degenerative hypertrophy L4-L5. L5
appears transitional. Normal alignment. Negative for fracture, dislocation, or
other acute bone injury. Right pelvic phlebolith.

[t l-spine a.p.]
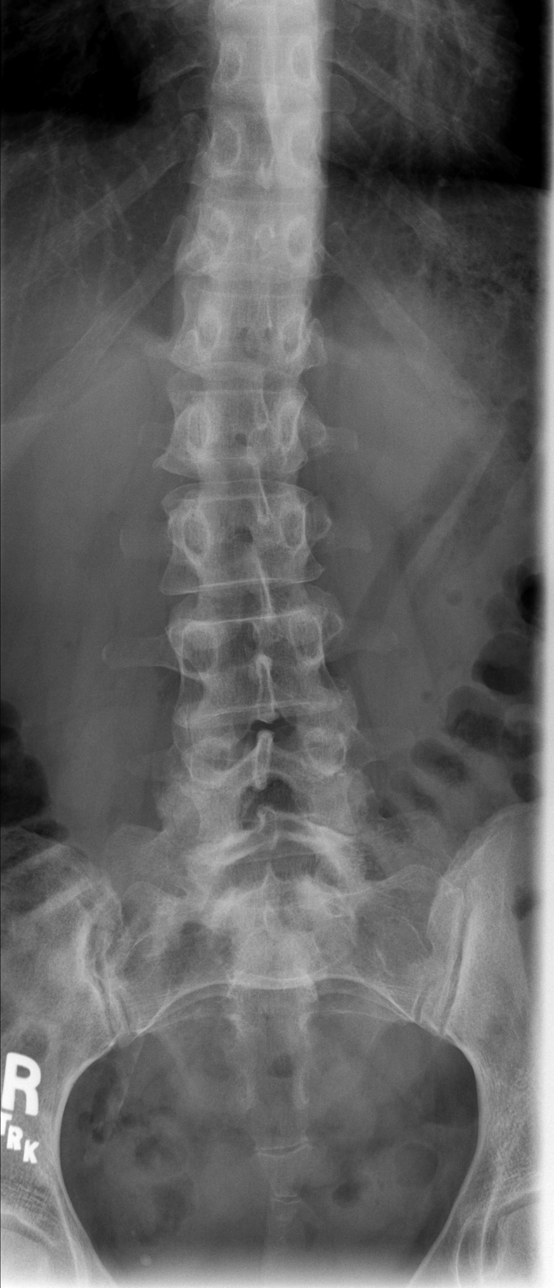

[t l-spine oblique exposure (1 of 2)]
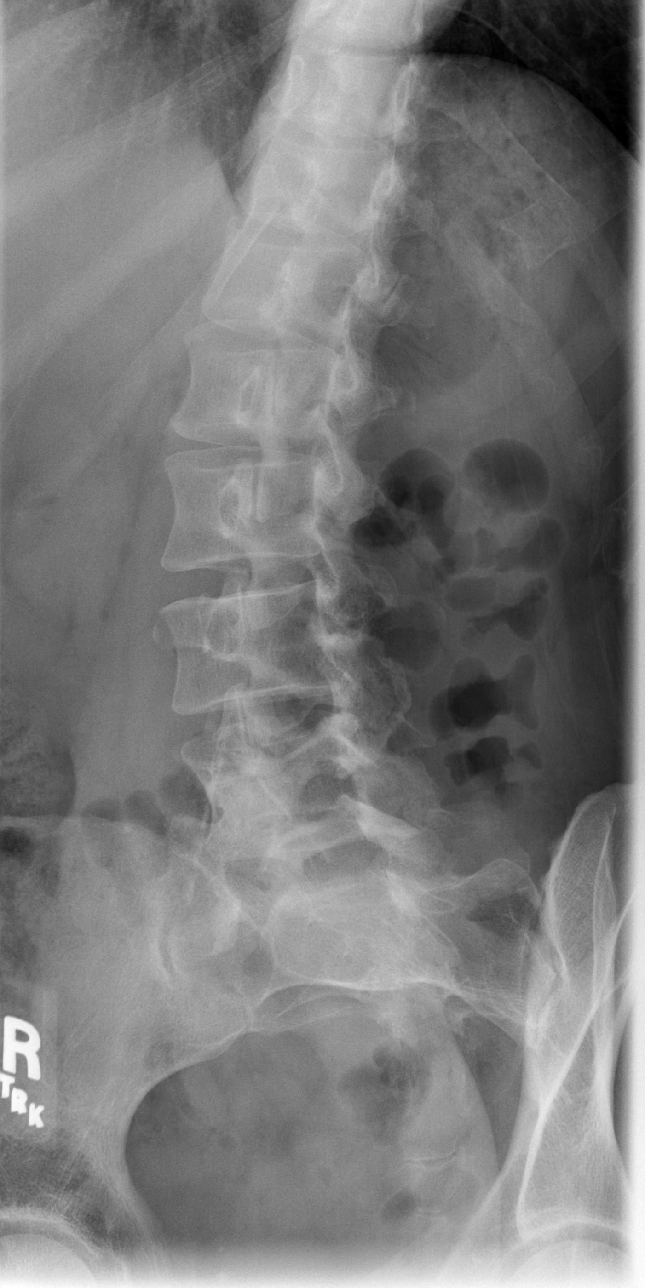

[t l-spine oblique exposure (2 of 2)]
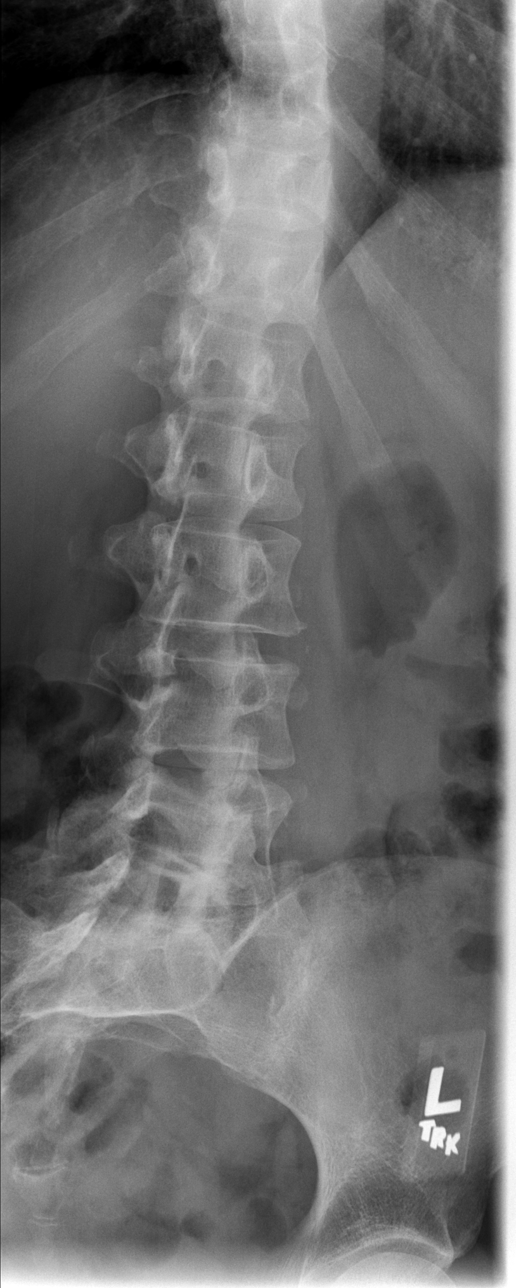

[t l-spine lat]
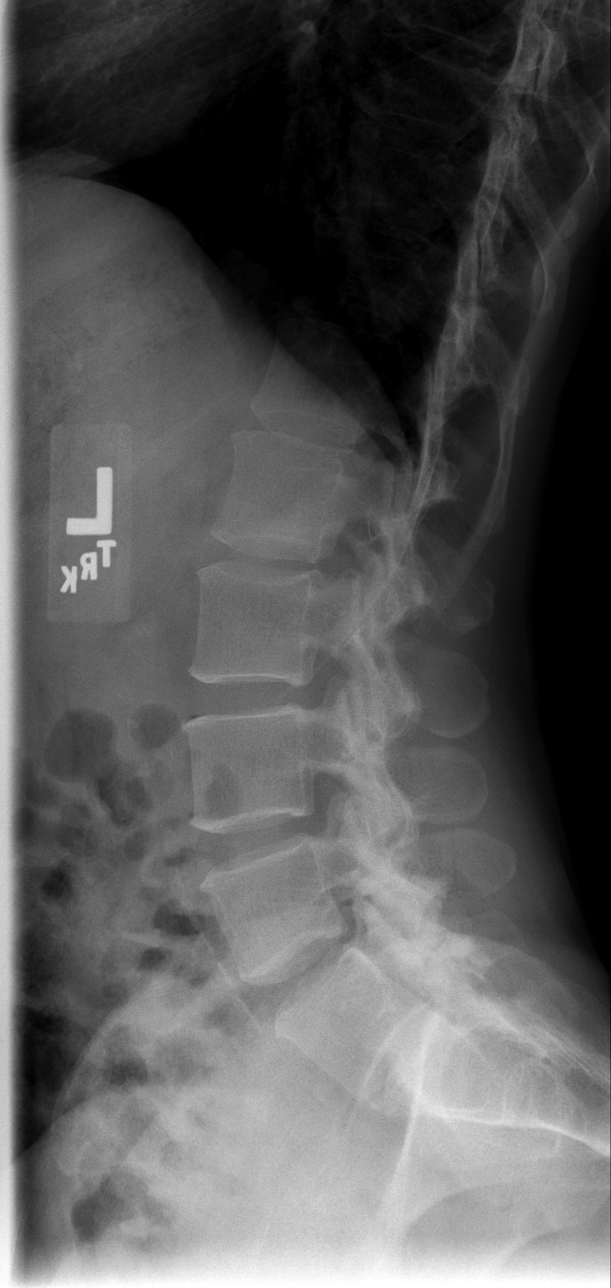

[t l-spine l5-s1 spot]
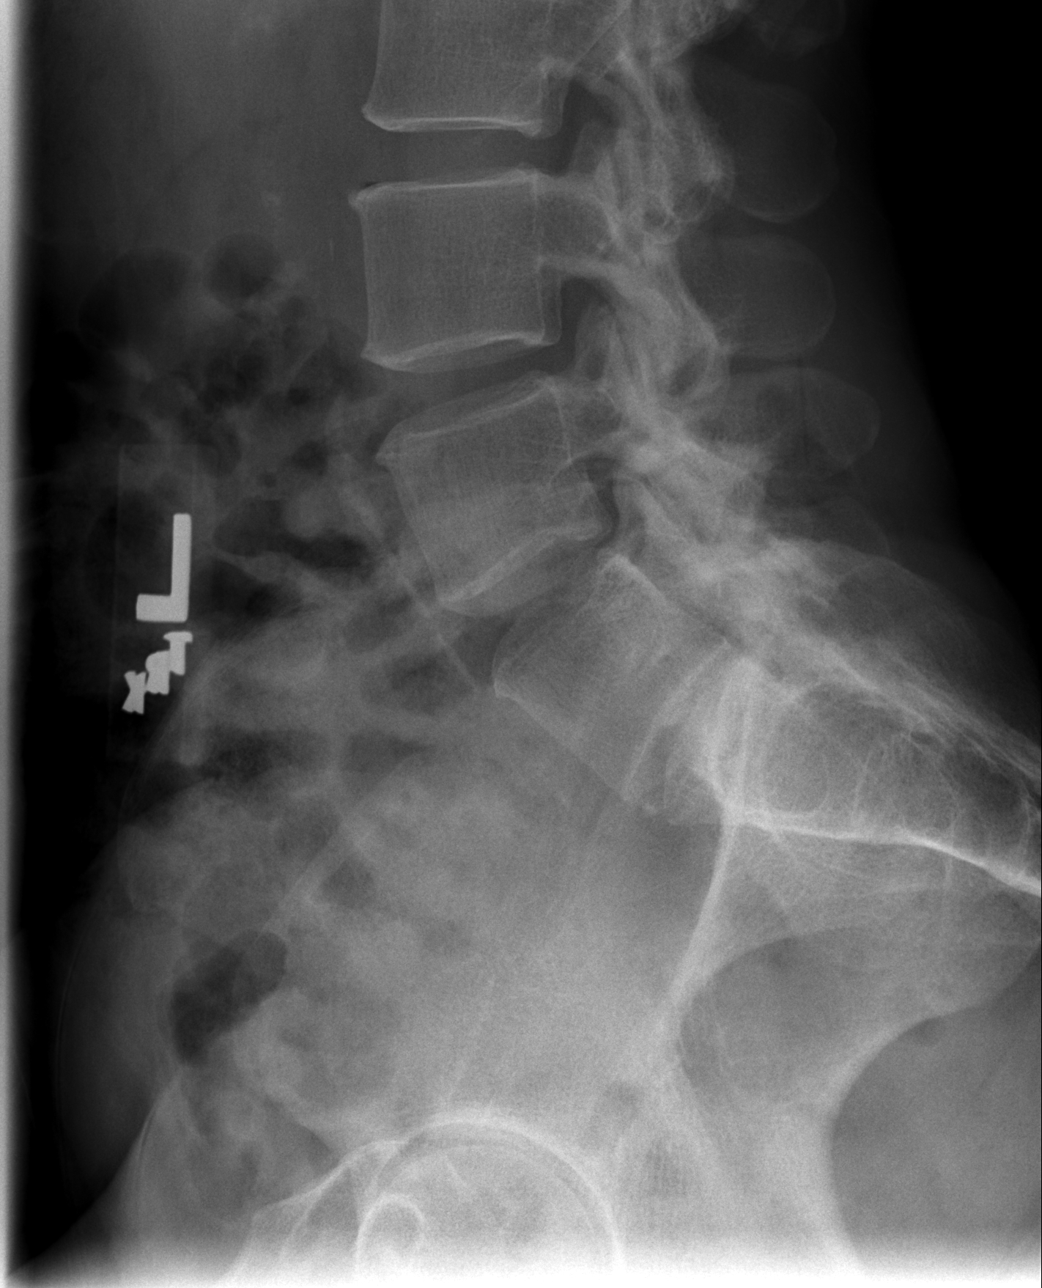

[5 of 5 positions shown; findings below may reference images not displayed]

IMPRESSION: 1. Negative for acute abnormality.

## 2009-05-03 ENCOUNTER — Emergency Department (HOSPITAL_COMMUNITY): Admission: EM | Admit: 2009-05-03 | Discharge: 2009-05-03 | Payer: Self-pay | Admitting: Emergency Medicine

## 2009-05-07 ENCOUNTER — Telehealth: Payer: Self-pay | Admitting: *Deleted
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
## 2009-05-20 ENCOUNTER — Emergency Department (HOSPITAL_COMMUNITY): Admission: EM | Admit: 2009-05-20 | Discharge: 2009-05-20 | Payer: Self-pay | Admitting: Emergency Medicine

## 2009-05-24 ENCOUNTER — Ambulatory Visit: Payer: Self-pay | Admitting: Internal Medicine

## 2009-05-24 LAB — CONVERTED CEMR LAB
BUN: 19 mg/dL (ref 6–23)
Calcium: 9.6 mg/dL (ref 8.4–10.5)
Glucose, Bld: 82 mg/dL (ref 70–99)

## 2009-06-06 ENCOUNTER — Emergency Department (HOSPITAL_COMMUNITY): Admission: EM | Admit: 2009-06-06 | Discharge: 2009-06-06 | Payer: Self-pay | Admitting: Emergency Medicine

## 2009-06-14 IMAGING — US US TRANSVAGINAL NON-OB
1 series · 14 of 25 positions shown · non-contrast
Comparison: none

CLINICAL DATA: Pelvic and vaginal pain with cycle for two years. 
 TRANSABDOMINAL AND TRANSVAGINAL PELVIC ULTRASOUND:
TECHNIQUE: Both transabdominal and transvaginal ultrasound examinations of the pelvis were performed including evaluation of the uterus, ovaries, adnexal regions, and pelvic cul-de-sac.

[Series 1: us transvaginal non-ob · 0.28mm/px · 14 of 50 slices shown]
[im 1/50]
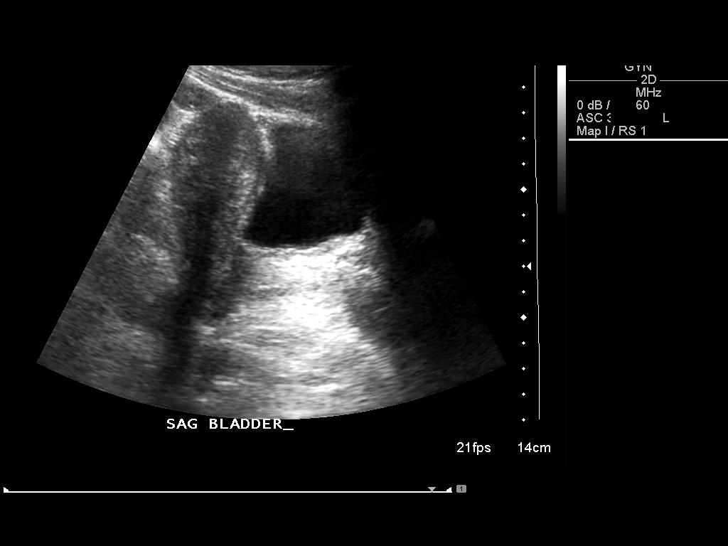
[im 5/50]
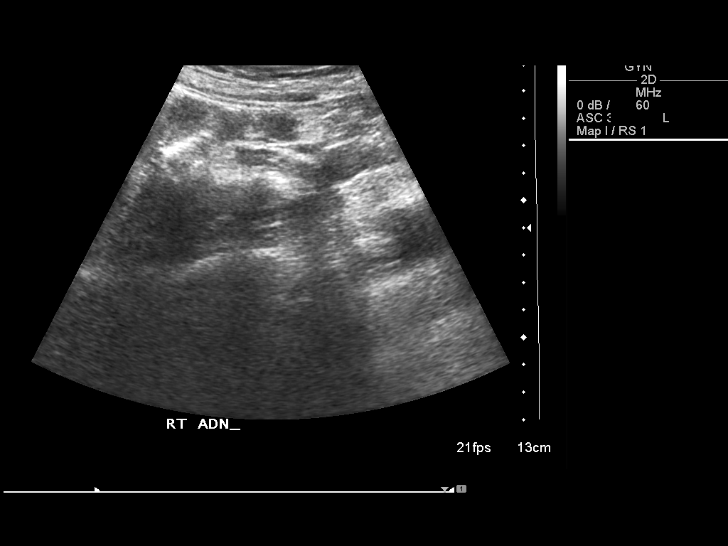
[im 9/50]
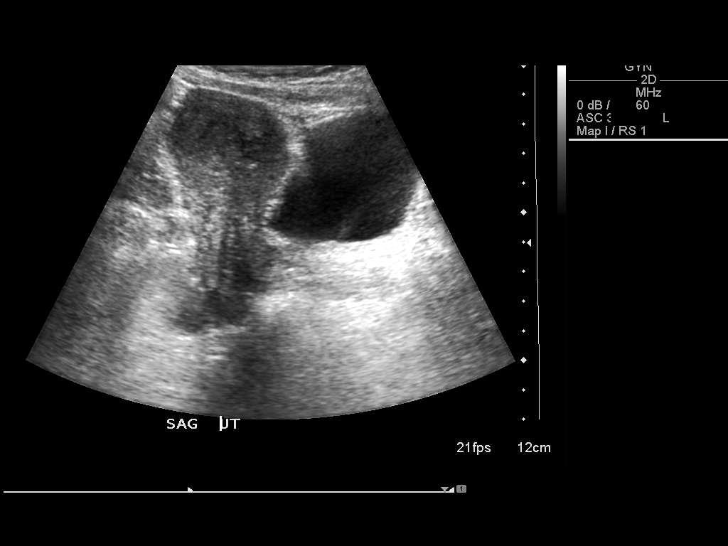
[im 13/50]
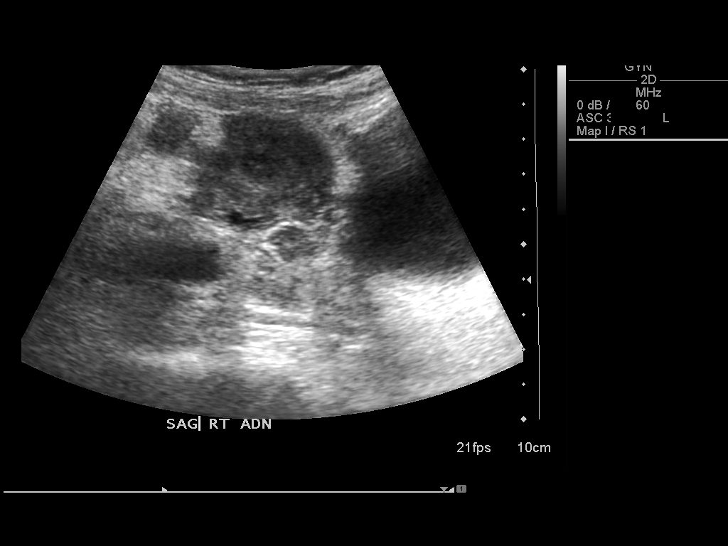
[im 17/50]
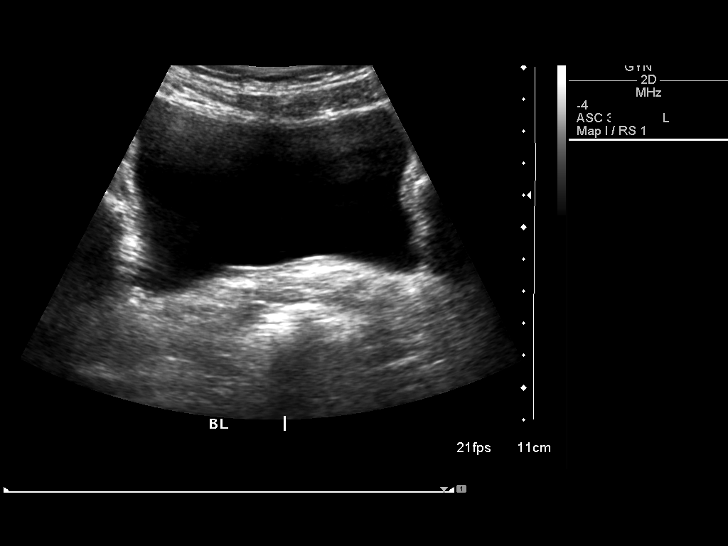
[im 19/50]
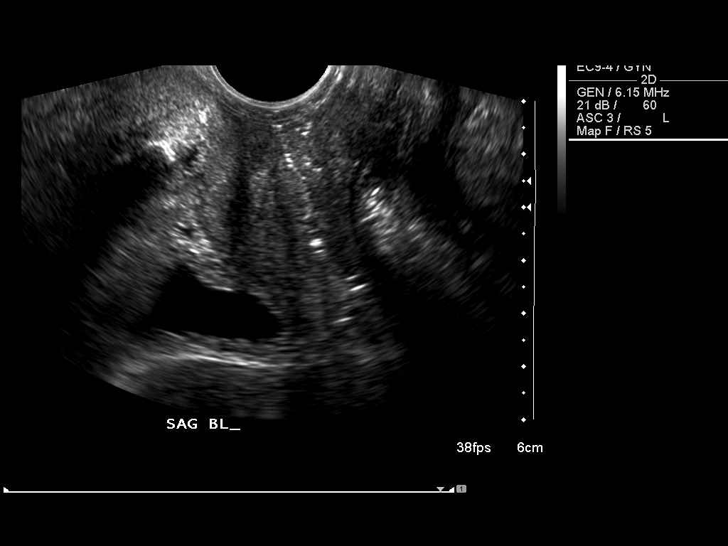
[im 23/50]
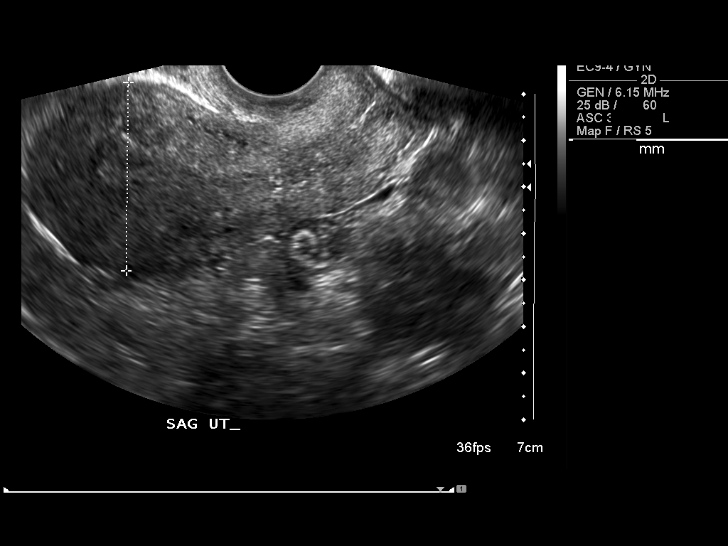
[im 27/50]
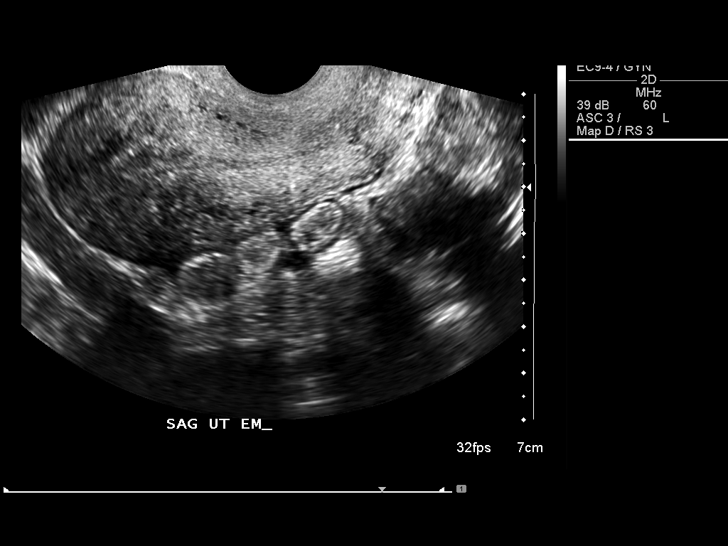
[im 31/50]
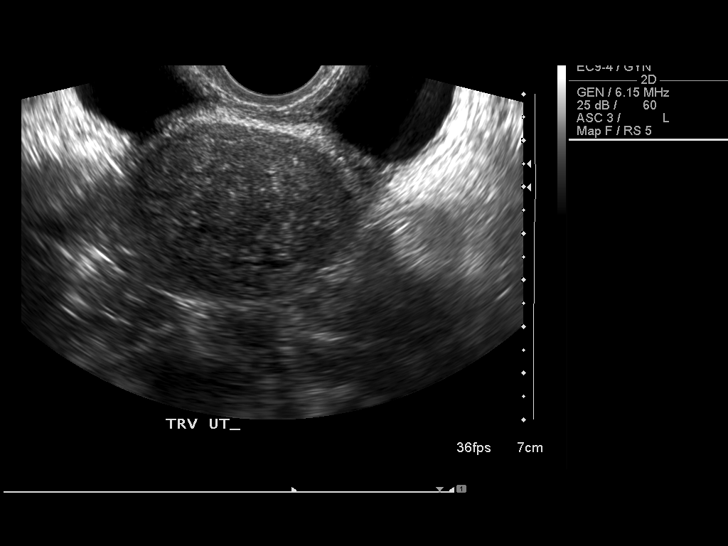
[im 33/50]
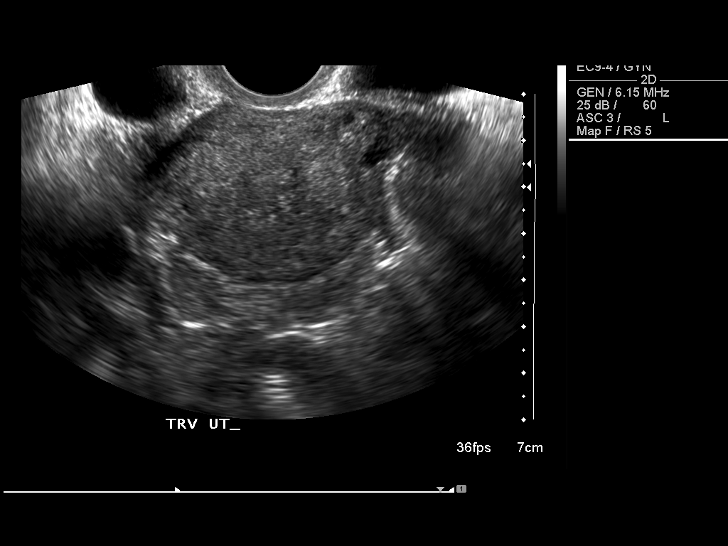
[im 37/50]
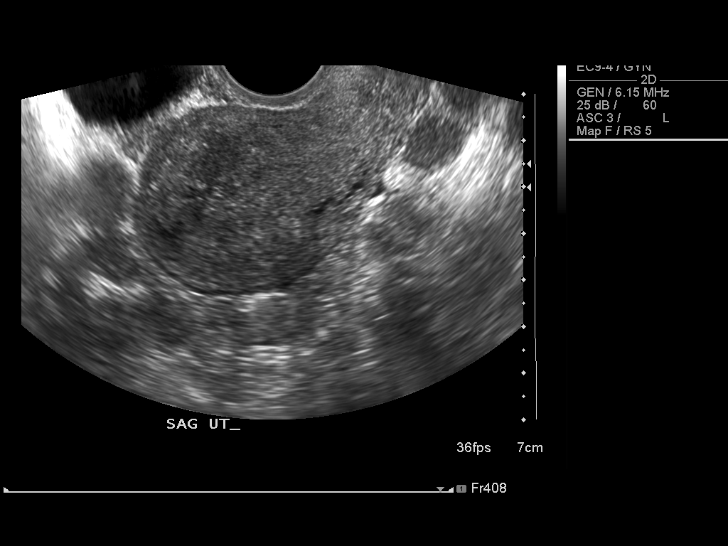
[im 41/50]
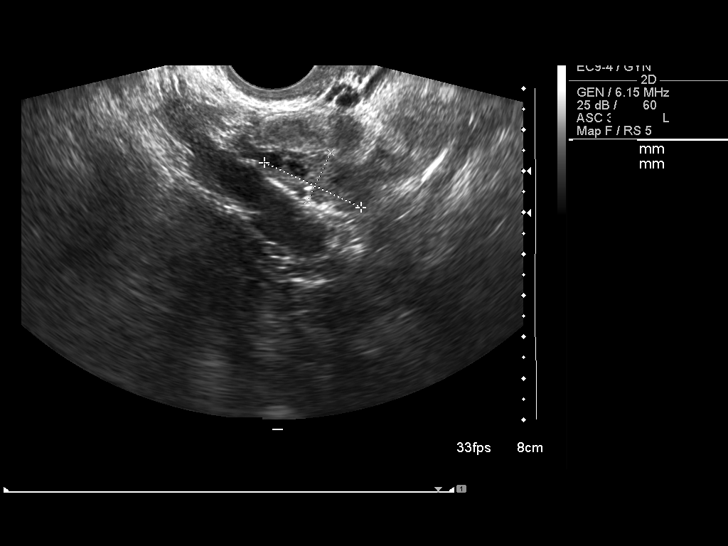
[im 45/50]
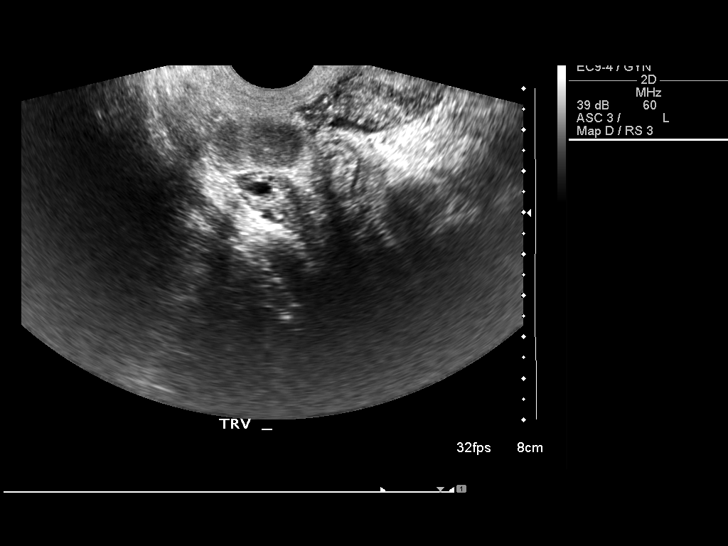
[im 50/50]
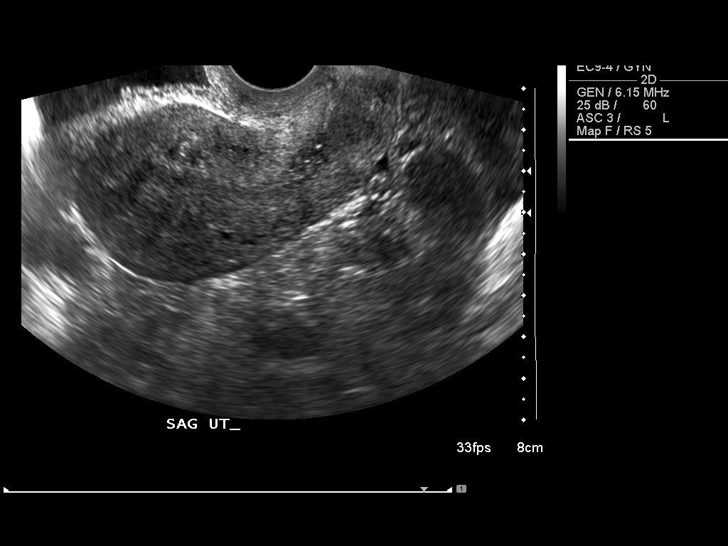

[14 of 25 positions shown; findings below may reference images not displayed]

FINDINGS: Multiple images of the uterus and adnexa were obtained using transabdominal and endovaginal approaches.  The uterus has a sagittal length of 8.3 cm and AP width of 4.0 cm and a transverse width of 5.2 cm.  A homogenous uterine myometrium is seen.  The endometrial stripe is echogenic with an AP width of 3.7 mm.  This would correlate with the post-secretory phase of the cycle.  The patient gave an LMP of 01/15/07.
 Both ovaries have a normal appearance with the right ovary measuring 2.6 x 1.3 x 1.1 cm and the left ovary measuring 2.1 x 1.1 x 1.7 cm.  No cul-de-sac or periovarian fluid is seen, and no separate adnexal masses are noted.
IMPRESSION: Normal post-secretory pelvic ultrasound.

## 2009-06-18 ENCOUNTER — Ambulatory Visit: Payer: Self-pay | Admitting: Internal Medicine

## 2009-06-24 ENCOUNTER — Encounter (INDEPENDENT_AMBULATORY_CARE_PROVIDER_SITE_OTHER): Payer: Self-pay | Admitting: Internal Medicine

## 2009-06-24 ENCOUNTER — Encounter: Payer: Self-pay | Admitting: Internal Medicine

## 2009-06-24 ENCOUNTER — Ambulatory Visit: Payer: Self-pay | Admitting: Infectious Diseases

## 2009-06-24 ENCOUNTER — Ambulatory Visit: Payer: Self-pay | Admitting: Internal Medicine

## 2009-06-24 ENCOUNTER — Ambulatory Visit: Payer: Self-pay | Admitting: Cardiology

## 2009-06-24 DIAGNOSIS — R599 Enlarged lymph nodes, unspecified: Secondary | ICD-10-CM | POA: Insufficient documentation

## 2009-06-24 LAB — CONVERTED CEMR LAB
HIV: NEGATIVE
Vitamin B-12: 513 pg/mL

## 2009-06-25 ENCOUNTER — Encounter: Payer: Self-pay | Admitting: Cardiology

## 2009-06-26 ENCOUNTER — Inpatient Hospital Stay (HOSPITAL_COMMUNITY): Admission: RE | Admit: 2009-06-26 | Discharge: 2009-06-27 | Payer: Self-pay | Admitting: Infectious Diseases

## 2009-06-26 ENCOUNTER — Encounter: Payer: Self-pay | Admitting: Internal Medicine

## 2009-07-12 ENCOUNTER — Telehealth: Payer: Self-pay | Admitting: *Deleted

## 2009-07-22 ENCOUNTER — Ambulatory Visit: Payer: Self-pay | Admitting: Internal Medicine

## 2009-07-22 DIAGNOSIS — F1910 Other psychoactive substance abuse, uncomplicated: Secondary | ICD-10-CM

## 2009-09-02 ENCOUNTER — Emergency Department (HOSPITAL_COMMUNITY): Admission: EM | Admit: 2009-09-02 | Discharge: 2009-09-03 | Payer: Self-pay | Admitting: Emergency Medicine

## 2009-09-04 ENCOUNTER — Telehealth (INDEPENDENT_AMBULATORY_CARE_PROVIDER_SITE_OTHER): Payer: Self-pay | Admitting: Internal Medicine

## 2009-09-04 ENCOUNTER — Encounter: Payer: Self-pay | Admitting: Licensed Clinical Social Worker

## 2009-09-20 ENCOUNTER — Encounter: Payer: Self-pay | Admitting: Internal Medicine

## 2009-09-20 ENCOUNTER — Ambulatory Visit: Payer: Self-pay | Admitting: Infectious Diseases

## 2009-09-20 ENCOUNTER — Inpatient Hospital Stay (HOSPITAL_COMMUNITY): Admission: EM | Admit: 2009-09-20 | Discharge: 2009-09-21 | Payer: Self-pay | Admitting: Emergency Medicine

## 2009-09-20 LAB — CONVERTED CEMR LAB: Hgb A1c MFr Bld: 5.5 %

## 2009-09-21 ENCOUNTER — Encounter: Payer: Self-pay | Admitting: Internal Medicine

## 2009-09-30 ENCOUNTER — Emergency Department (HOSPITAL_COMMUNITY): Admission: EM | Admit: 2009-09-30 | Discharge: 2009-10-01 | Payer: Self-pay | Admitting: Emergency Medicine

## 2009-10-30 ENCOUNTER — Telehealth: Payer: Self-pay | Admitting: Internal Medicine

## 2009-10-31 ENCOUNTER — Telehealth (INDEPENDENT_AMBULATORY_CARE_PROVIDER_SITE_OTHER): Payer: Self-pay | Admitting: *Deleted

## 2009-11-08 ENCOUNTER — Ambulatory Visit: Payer: Self-pay | Admitting: Internal Medicine

## 2009-12-12 ENCOUNTER — Telehealth (INDEPENDENT_AMBULATORY_CARE_PROVIDER_SITE_OTHER): Payer: Self-pay | Admitting: *Deleted

## 2009-12-12 ENCOUNTER — Emergency Department (HOSPITAL_COMMUNITY): Admission: EM | Admit: 2009-12-12 | Discharge: 2009-12-12 | Payer: Self-pay | Admitting: Emergency Medicine

## 2009-12-21 IMAGING — CT CT NECK W/ CM
2 series · 16 of 30 positions shown, 18 images · IV contrast (100 ML OMNI 300)
Comparison: 03/21/2006

CLINICAL DATA: Swelling of the left face and neck.  Possible dental
abscess.

CT NECK WITH CONTRAST
TECHNIQUE: Multidetector CT imaging of the neck was performed with
intravenous contrast.
Contrast: 80 ml [id number]

[Series 400: reformatted · coronal · 0.52mm/px · 11 of 98 slices shown, 12 images (1 of 2)]
[im 12/98  bone]
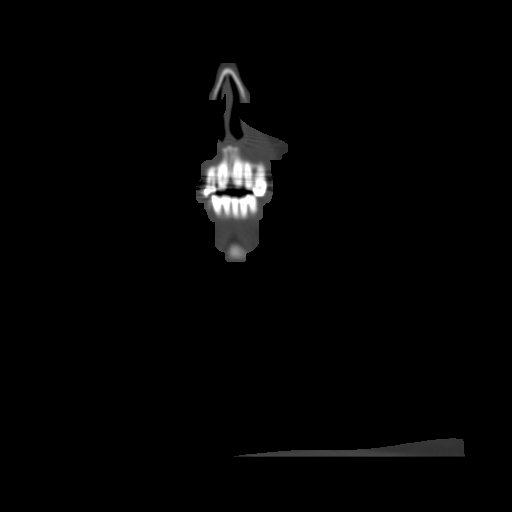
[im 14/98  bone]
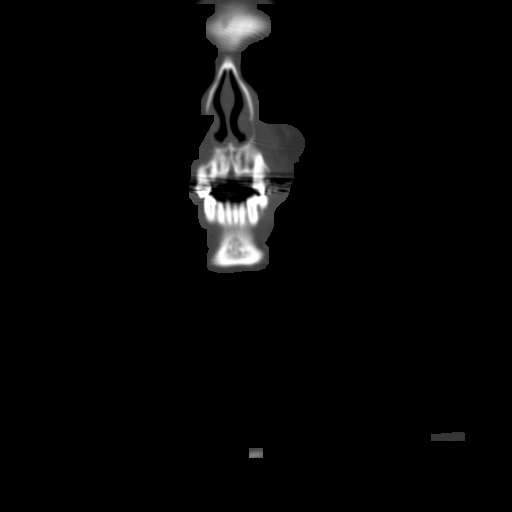
[im 27/98  bone]
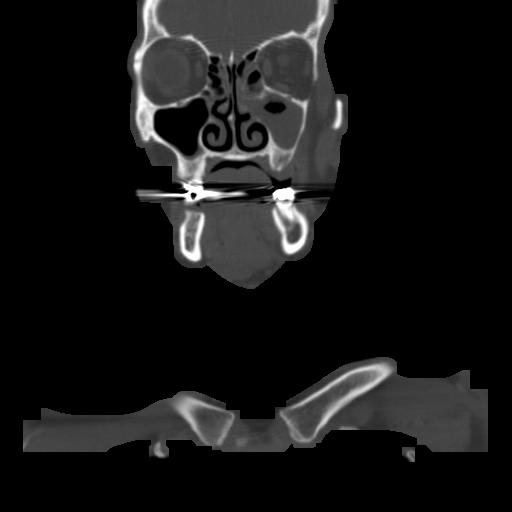
[im 28/98  bone]
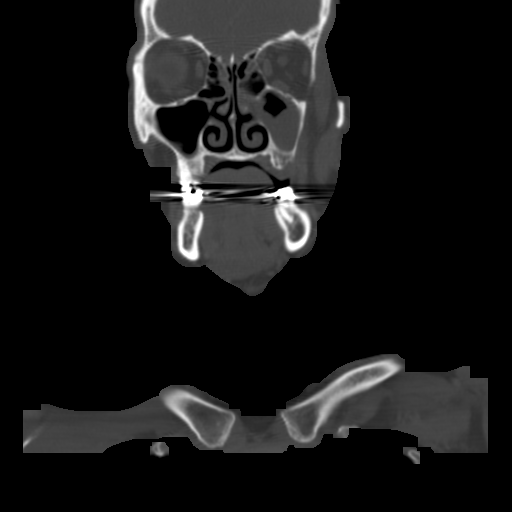
[im 42/98  soft-tissue]
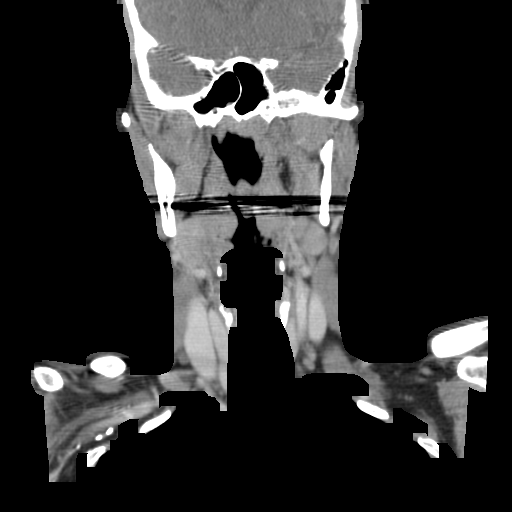
[im 42/98  bone]
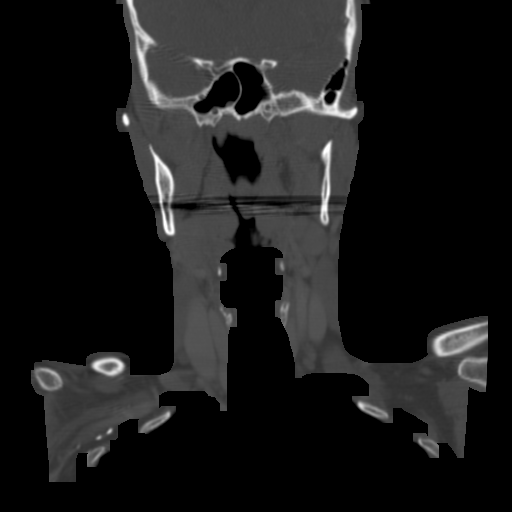
[im 56/98  bone]
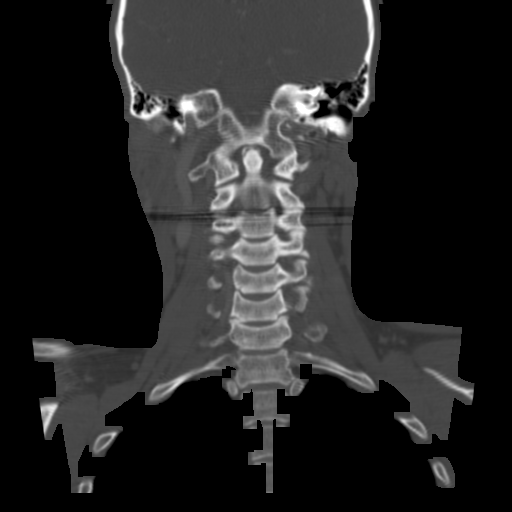
[im 57/98  bone]
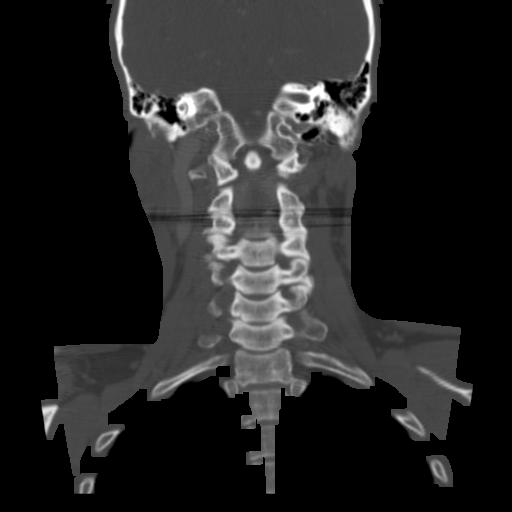
[im 70/98  bone]
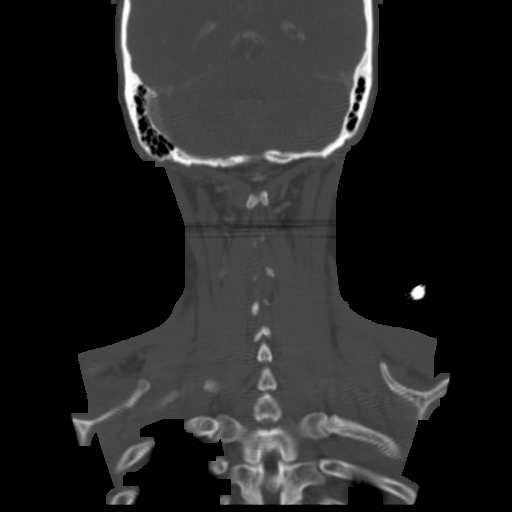
[im 72/98  bone]
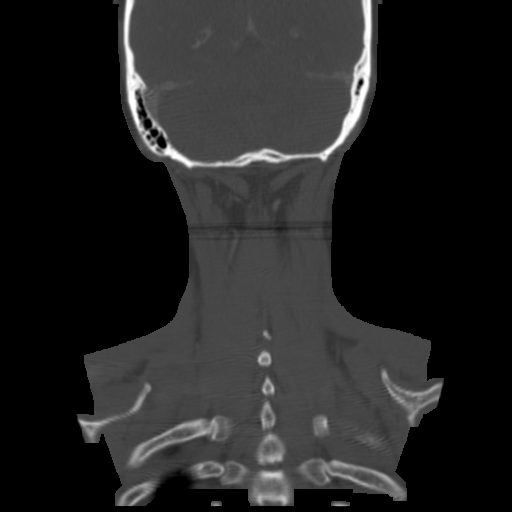
[im 84/98  bone]
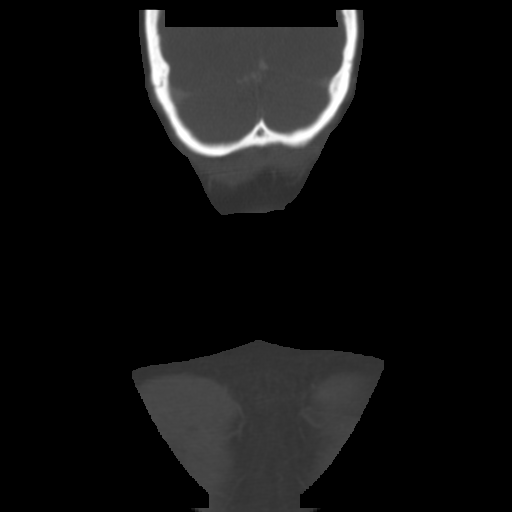
[im 87/98  bone]
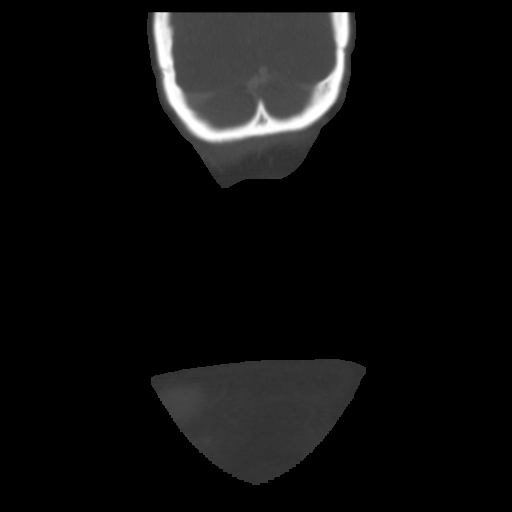

[Series 401: reformatted · sagittal · 0.52mm/px · 5 of 80 slices shown, 6 images (2 of 2)]
[im 32/80  bone]
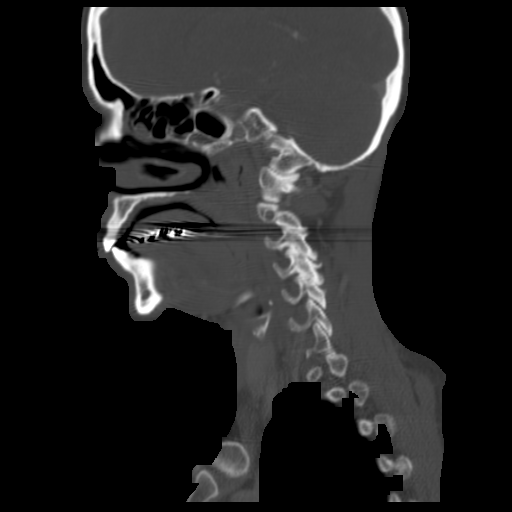
[im 33/80  bone]
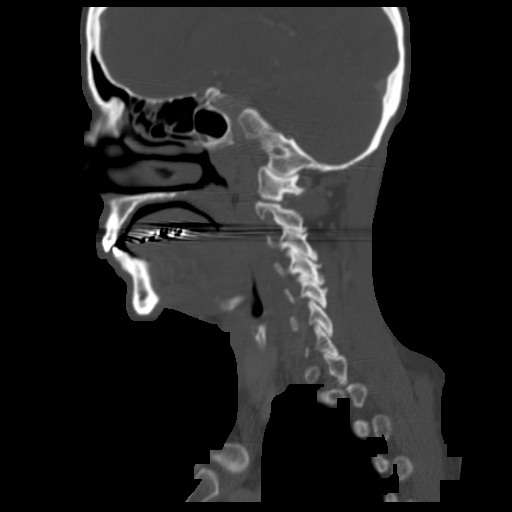
[im 40/80  soft-tissue]
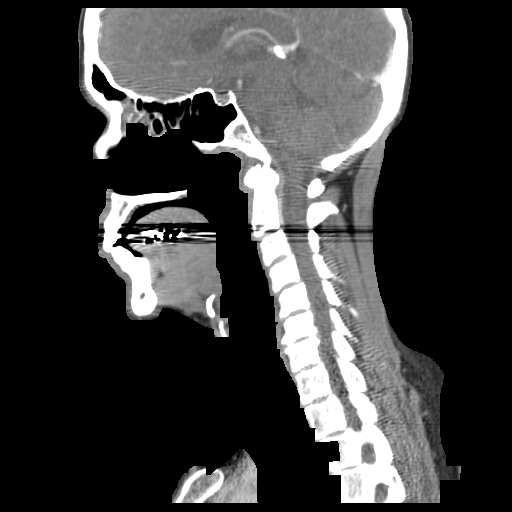
[im 40/80  bone]
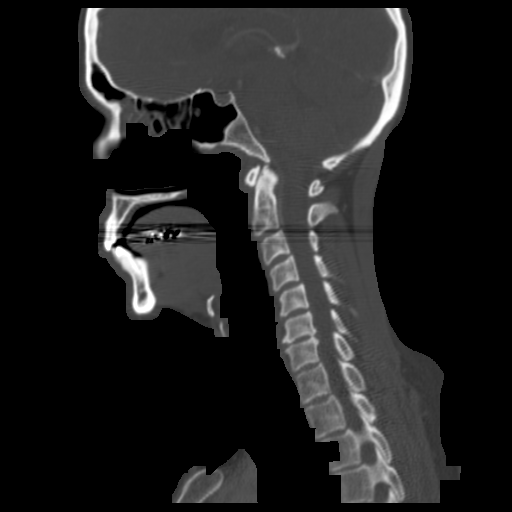
[im 47/80  bone]
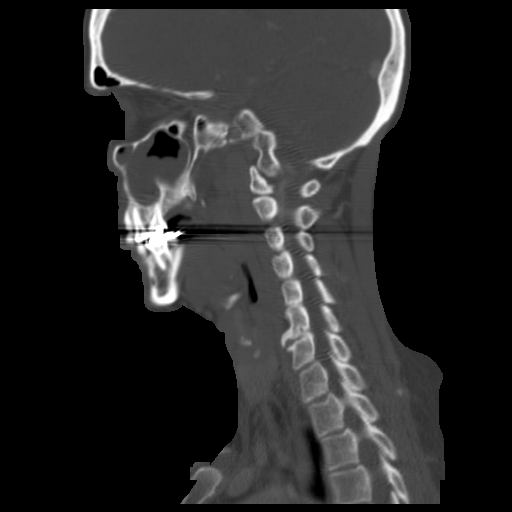
[im 48/80  bone]
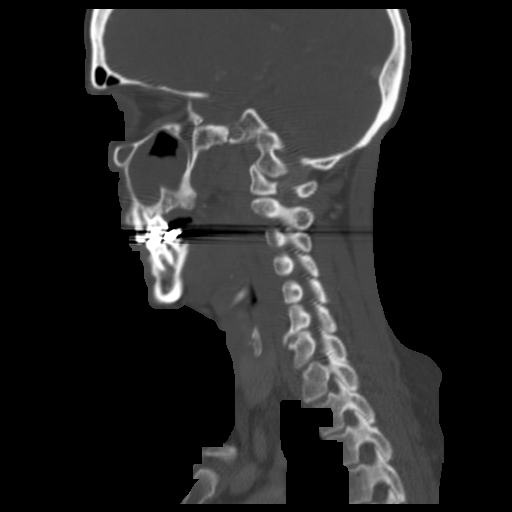

[16 of 30 positions shown; findings below may reference images not displayed]

FINDINGS: Visualized intracranial contents appear normal.  There
are early emphysematous changes at the lung apices.

Parotid, submandibular and thyroid glands appear normal.

There appears to be mild soft tissue density within the fat of the
left side of the face consistent with cellulitis.  I do not see any
abscess.  The patient has inflammatory disease affecting the left
maxillary sinus and the left ethmoid sinuses.  There is a small
amount of fluid dependent within the right maxillary sinus.  I do
not see any advanced dental or periodontal disease.  Panorex or
dental radiographs might be considered if concern persists.

No adenopathy or other masses in the neck.  Ordinary cervical
spondylosis incidentally noted.
IMPRESSION: Findings consistent with left facial cellulitis.  No facial abscess
is evident

Inflammatory change of the left maxillary sinus could predispose to
this.  There  is also some sinus disease affecting the ethmoid
region on the left and a small amount of fluid dependent within the
right maxillary sinus.

No advanced dental or periodontal disease is evident.  There may be
minor changes of decay and inflammation.  Panorex or dental
radiographs could offer better detail.

## 2010-01-01 ENCOUNTER — Ambulatory Visit: Payer: Self-pay | Admitting: Internal Medicine

## 2010-01-01 DIAGNOSIS — I219 Acute myocardial infarction, unspecified: Secondary | ICD-10-CM | POA: Insufficient documentation

## 2010-01-02 ENCOUNTER — Encounter: Payer: Self-pay | Admitting: Internal Medicine

## 2010-01-09 ENCOUNTER — Ambulatory Visit (HOSPITAL_COMMUNITY): Admission: RE | Admit: 2010-01-09 | Discharge: 2010-01-09 | Payer: Self-pay | Admitting: Internal Medicine

## 2010-01-11 DIAGNOSIS — N281 Cyst of kidney, acquired: Secondary | ICD-10-CM

## 2010-01-11 HISTORY — DX: Cyst of kidney, acquired: N28.1

## 2010-01-14 LAB — CONVERTED CEMR LAB
AST: 15 units/L (ref 0–37)
Albumin: 4.7 g/dL (ref 3.5–5.2)
Alkaline Phosphatase: 88 units/L (ref 39–117)
Amphetamine Screen, Ur: NEGATIVE
Basophils Relative: 0 % (ref 0–1)
Eosinophils Absolute: 0.2 10*3/uL (ref 0.0–0.7)
MCHC: 32.7 g/dL (ref 30.0–36.0)
MCV: 100.2 fL — ABNORMAL HIGH (ref >=78.0)
Marijuana Metabolite: NEGATIVE
Methadone: NEGATIVE
Neutrophils Relative %: 67 % (ref 43–77)
Opiates: NEGATIVE
Phencyclidine (PCP): NEGATIVE
Platelets: 450 10*3/uL — ABNORMAL HIGH (ref 150–400)
Potassium: 4.4 meq/L (ref 3.5–5.3)
Propoxyphene: NEGATIVE
RDW: 13.9 % (ref 11.5–15.5)
Sodium: 138 meq/L (ref 135–145)
Total Protein: 8.7 g/dL — ABNORMAL HIGH (ref 6.0–8.3)

## 2010-01-15 ENCOUNTER — Observation Stay (HOSPITAL_COMMUNITY): Admission: EM | Admit: 2010-01-15 | Discharge: 2010-01-16 | Payer: Self-pay | Admitting: Emergency Medicine

## 2010-01-15 ENCOUNTER — Telehealth: Payer: Self-pay | Admitting: *Deleted

## 2010-01-19 ENCOUNTER — Emergency Department (HOSPITAL_COMMUNITY): Admission: EM | Admit: 2010-01-19 | Discharge: 2010-01-19 | Payer: Self-pay | Admitting: Emergency Medicine

## 2010-01-24 ENCOUNTER — Ambulatory Visit: Payer: Self-pay | Admitting: Cardiology

## 2010-01-24 DIAGNOSIS — E785 Hyperlipidemia, unspecified: Secondary | ICD-10-CM | POA: Insufficient documentation

## 2010-01-27 ENCOUNTER — Emergency Department (HOSPITAL_COMMUNITY): Admission: EM | Admit: 2010-01-27 | Discharge: 2010-01-28 | Payer: Self-pay | Admitting: Emergency Medicine

## 2010-02-03 ENCOUNTER — Telehealth (INDEPENDENT_AMBULATORY_CARE_PROVIDER_SITE_OTHER): Payer: Self-pay | Admitting: *Deleted

## 2010-02-04 ENCOUNTER — Encounter: Payer: Self-pay | Admitting: Internal Medicine

## 2010-02-04 ENCOUNTER — Ambulatory Visit: Payer: Self-pay | Admitting: Internal Medicine

## 2010-02-04 ENCOUNTER — Observation Stay (HOSPITAL_COMMUNITY)
Admission: AD | Admit: 2010-02-04 | Discharge: 2010-02-07 | Payer: Self-pay | Source: Home / Self Care | Admitting: Internal Medicine

## 2010-02-04 ENCOUNTER — Ambulatory Visit: Payer: Self-pay | Admitting: Cardiovascular Disease

## 2010-02-05 ENCOUNTER — Encounter (INDEPENDENT_AMBULATORY_CARE_PROVIDER_SITE_OTHER): Payer: Self-pay | Admitting: Family Medicine

## 2010-02-05 ENCOUNTER — Encounter: Payer: Self-pay | Admitting: Internal Medicine

## 2010-02-05 LAB — CONVERTED CEMR LAB: TSH: 2.016 microintl units/mL

## 2010-02-07 ENCOUNTER — Encounter: Payer: Self-pay | Admitting: Internal Medicine

## 2010-02-07 ENCOUNTER — Ambulatory Visit: Payer: Self-pay | Admitting: Cardiology

## 2010-02-14 ENCOUNTER — Encounter: Payer: Self-pay | Admitting: Physician Assistant

## 2010-02-17 ENCOUNTER — Encounter: Payer: Self-pay | Admitting: Physician Assistant

## 2010-02-17 ENCOUNTER — Ambulatory Visit: Payer: Self-pay | Admitting: Internal Medicine

## 2010-03-20 ENCOUNTER — Emergency Department (HOSPITAL_COMMUNITY)
Admission: EM | Admit: 2010-03-20 | Discharge: 2010-03-21 | Payer: Self-pay | Source: Home / Self Care | Admitting: Emergency Medicine

## 2010-03-20 ENCOUNTER — Encounter: Payer: Self-pay | Admitting: Internal Medicine

## 2010-03-20 DIAGNOSIS — N281 Cyst of kidney, acquired: Secondary | ICD-10-CM

## 2010-03-27 ENCOUNTER — Ambulatory Visit: Payer: Self-pay | Admitting: Internal Medicine

## 2010-03-27 ENCOUNTER — Encounter: Payer: Self-pay | Admitting: Internal Medicine

## 2010-03-27 ENCOUNTER — Ambulatory Visit: Payer: Self-pay

## 2010-03-27 LAB — CONVERTED CEMR LAB
Barbiturate Quant, Ur: NEGATIVE
CO2: 23 meq/L (ref 19–32)
Calcium: 9.3 mg/dL (ref 8.4–10.5)
Cocaine Metabolites: NEGATIVE
Creatinine, Ser: 0.6 mg/dL (ref 0.40–1.20)
Creatinine,U: 99.4 mg/dL
Methadone: NEGATIVE
Propoxyphene: NEGATIVE

## 2010-04-03 ENCOUNTER — Ambulatory Visit (HOSPITAL_COMMUNITY)
Admission: RE | Admit: 2010-04-03 | Discharge: 2010-04-03 | Payer: Self-pay | Source: Home / Self Care | Attending: Internal Medicine | Admitting: Internal Medicine

## 2010-04-15 ENCOUNTER — Ambulatory Visit: Admit: 2010-04-15 | Payer: Self-pay | Admitting: Cardiology

## 2010-04-22 ENCOUNTER — Encounter: Payer: Self-pay | Admitting: Licensed Clinical Social Worker

## 2010-04-22 ENCOUNTER — Ambulatory Visit: Admission: RE | Admit: 2010-04-22 | Discharge: 2010-04-22 | Payer: Self-pay | Source: Home / Self Care

## 2010-04-22 DIAGNOSIS — T7491XA Unspecified adult maltreatment, confirmed, initial encounter: Secondary | ICD-10-CM | POA: Insufficient documentation

## 2010-04-22 DIAGNOSIS — M542 Cervicalgia: Secondary | ICD-10-CM | POA: Insufficient documentation

## 2010-04-23 ENCOUNTER — Ambulatory Visit
Admission: RE | Admit: 2010-04-23 | Discharge: 2010-04-23 | Payer: Self-pay | Source: Home / Self Care | Attending: Cardiology | Admitting: Cardiology

## 2010-04-23 ENCOUNTER — Telehealth: Payer: Self-pay | Admitting: *Deleted

## 2010-04-23 ENCOUNTER — Encounter: Payer: Self-pay | Admitting: Cardiology

## 2010-04-23 DIAGNOSIS — R079 Chest pain, unspecified: Secondary | ICD-10-CM | POA: Insufficient documentation

## 2010-04-24 ENCOUNTER — Ambulatory Visit: Admit: 2010-04-24 | Payer: Self-pay

## 2010-04-25 ENCOUNTER — Telehealth: Payer: Self-pay | Admitting: Cardiology

## 2010-04-28 ENCOUNTER — Telehealth: Payer: Self-pay | Admitting: Cardiology

## 2010-05-07 ENCOUNTER — Ambulatory Visit: Admit: 2010-05-07 | Payer: Self-pay | Admitting: Cardiology

## 2010-05-11 LAB — CONVERTED CEMR LAB
ALT: 20 units/L (ref 0–35)
Albumin: 3.9 g/dL (ref 3.5–5.2)
Alkaline Phosphatase: 89 units/L (ref 39–117)
Total CHOL/HDL Ratio: 5
Total Protein: 8.1 g/dL (ref 6.0–8.3)
Triglycerides: 168 mg/dL — ABNORMAL HIGH (ref 0.0–149.0)

## 2010-05-13 NOTE — Assessment & Plan Note (Signed)
Summary: eph/gd   Visit Type:  EPH Primary Provider:  Johnette Abraham DO  CC:  pt states she has had some fatigue since she has been out of the hospital...sob w/walking...chest tightness that radiates to the middle of her back but states she has not had any of this in the past 5 days....denies any edema.  History of Present Illness: Primary Cardiologist:  Dr. 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  In 3/11, patient presented with NSTEMI in the setting of cocaine use.  Left heart cath showed moderate nonobstructive disease (nothing more than 50%).  EF was also mildly reduced with an apical wall motion abnormality.   She was recently admitted to [facility] 10/25 to 10/28 with right flank pain and possible UTI vs. pyelonephritis.  She was noted to have an abnormal EKG during admission.  The attending service apparently spoke with Dr. Shirlee Latch and a stress test was recommended.  She had a [place] that demonstrated normal wall motion, no ischemia and an EF of 52%.  An echo was done 02/05/2010 that demonstrated  an EF of 55% to 60%, mild AI and mild MR.  In the hospital, she was noted to have diffuse T-wave inversions and QT prolongation.  In reviewing her electrocardiograms, she has had similar findings in the past.  There is essentially no change on her electrocardiogram today when compared to her last visit with Dr. Shirlee Latch.  She notes occasional chest pain.  This comes on at any time.  She points to her substernal chest.  She describes it as sharp.  She denies any exertional symptoms.  She denies any symptoms associated with eating certain meals or coughing or wheezing.  She denies significant exertional shortness of breath.  She denies orthopnea or PND.  She denies significant pedal edema.  She denies syncope or near-syncope or palpitations.     Current Medications (verified): 1)  Celexa 20 Mg Tabs  (Citalopram Hydrobromide) .... Take 1 Tablet By Mouth Once A Day 2)  Hydrochlorothiazide 25 Mg  Tabs (Hydrochlorothiazide) .... Take 1 Tablet By Mouth Once A Day 3)  Klonopin 0.5 Mg  Tabs (Clonazepam) .... Take 1 Tablet By Mouth Two Times A Day 4)  Mirena 20 Mcg/24hr Iud (Levonorgestrel) .... Per [facility] 5)  Pravastatin Sodium 80 Mg Tabs (Pravastatin Sodium) .... Take One Tablet By Mouth Daily At Bedtime 6)  Aspirin 325 Mg Tabs (Aspirin) .... Take 1 Tablet By Mouth Once A Day 7)  Lisinopril 20 Mg Tabs (Lisinopril) .... Take 1 Tablet By Mouth Once A Day 8)  Ultram 50 Mg Tabs (Tramadol Hcl) .... Take 2 Tablets By Mouth Four Times A Day, As Needed For Back Pain 9)  Multivitamins  Tabs (Multiple Vitamin) .... Take 1 Tablet By Mouth Once A Day 10)  Fish Oil 1000 Mg Caps (Omega-3 Fatty Acids) .... 6 Tabs By Mouth Daily 11)  Coq-10 100 Mg Caps (Coenzyme Q10) .... Take 1 Tablet Daily 12)  Flexeril 5 Mg Tabs (Cyclobenzaprine Hcl) .... Take 1 Tablet By Mouth Three Times A Day  Allergies (verified): No Known Drug Allergies  Past History:  Past Medical History: 1. CAD: NSTEMI in setting of cocaine use in 3/11.  LHC with 30% mLAD, 30% mCFX, 50% mOM2, 50% RV marginal, EF 50% with inferoapical hypokinesis.  Echo (3/11): mild LVH, EF 50% with inferoapical, apical septal, and true apex severe hypokinesis to akinesis, moderate MR, PA systolic pressure 37  mmHg.    a.  Myoview 01/2010:  no ischemia, EF 52%   b.  Echo 02/05/2010:  EF 55-60%; mild AI and mild MR 2. History of pyelonephritis 3. HTN 4. Anemia 5. Fibroids 6. Anxiety 7. Depression 8. Chronic back pian      MRI:  disc degeneration L4 and mod-severe facet dz L4-5      Neurosurg. recommends epidural steroids 9. Left index finger laceration Nov 09, with tendon involvment 10. TIA 11. Cocaine abuse 12. Smoker  Past Surgical History: Reviewed history from 06/24/2009 and no changes required. MRSA abscess I and D  Review of Systems        She complains about a lot of low back pain.  No dysuria.  Vital Signs:  Patient profile:   50 year old female Height:      63 inches Weight:      128.8 pounds BMI:     22.90 Pulse rate:   78 / minute Pulse rhythm:   irregular BP sitting:   130 / 80  (right arm) Cuff size:   regular  Vitals Entered By: Danielle Rankin, CMA (February 17, 2010 9:43 AM)  Physical Exam  General:  Well nourished, well developed, in no acute distress HEENT: normal Neck: no JVD Cardiac:  normal S1, S2; RRR; no murmur Lungs:  clear to auscultation bilaterally, no wheezing, rhonchi or rales Abd: soft, nontender, no hepatomegaly Ext: no  edema Vascular: no carotid  bruits Skin: warm and dry Neuro:  CNs 2-12 intact, no focal abnormalities noted    EKG  Procedure date:  02/17/2010  Findings:      Normal sinus rhythm with rate of:  78. normal axis TW inversions in 2, 3, aVF, V3-6 no change when compared to 01/24/2010 QTc 442 ms   Impression & Recommendations:  Problem # 1:  CHEST PAIN (ICD-786.50)  Discussed with her primary cardiologist.  She had a recent nonischemic Myoview.  Her last heart catheterization in March of 2011 demonstrated nonobstructive disease with nothing more than 50% stenosis.  She had a recent followup echocardiogram that demonstrated normal LV function.  At this point, we will add a calcium channel blocker to cover for the possibility of coronary vasospasm.    She will also follow up with her primary care physician.  She seems to think some of her symptoms stem from anxiety.  She denies any significant symptoms of acid reflux.  She does continue to smoke and does have symptoms of coughing and wheezing at times.  She certainly could have some chest wall pain related to COPD.  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  Problem # 5:  HYPERLIPIDEMIA-MIXED (ICD-272.4) She will need followup lipids and LFTs in January 2012.  Her updated medication list for this problem includes:    Pravastatin Sodium 80 Mg Tabs (Pravastatin sodium) .Marland Kitchen... Take one tablet by mouth daily at bedtime  Problem # 6:  TOBACCO ABUSE (ICD-305.1) We discussed cessation.  She is using an electronic cigarette.  Patient Instructions: 1)  Your physician recommends that you schedule a follow-up appointment in: February 2012 with Dr. Shirlee Latch 2)  Your physician recommends that you return for a FASTING lipid  Fasting Lipid and LFT in January 2012. 272.0 3)  Your physician has recommended you make the  following change in your medication: Amlodipine 5 mg.Take one tablet by mouth daily. Prescriptions: NORVASC 5 MG TABS (AMLODIPINE BESYLATE) take one tablet by mouth daily  #30 x 6   Entered by:   Ollen Gross, RN, BSN   Authorized by:   Tereso Newcomer PA-C   Signed by:   Ollen Gross, RN, BSN on 02/17/2010   Method used:   Print then Give to Patient   RxID:   [id number]  I have personally reviewed the prescriptions today for accuracy. Tereso Newcomer PA-C  February 17, 2010 10:29 AM
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
## 2010-05-13 NOTE — Progress Notes (Signed)
Summary: pt ED visit/ hla  Phone Note Call from Patient   Summary of Call: pt called from an ED phone requesting her med list be sent there, i spoke w/ ED nurse and tubed pt's chart summary Initial call taken by: Marin Roberts RN,  January 15, 2010 9:06 AM
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
## 2010-05-13 NOTE — Letter (Signed)
Summary: GYNECOLOGIC CYTOLOGY REPORT  GYNECOLOGIC CYTOLOGY REPORT   Imported By: Margie Billet 01/07/2010 13:57:08  _____________________________________________________________________  External Attachment:    Type:   Image     Comment:   External Document

## 2010-05-13 NOTE — Miscellaneous (Signed)
Hospital Discharge  Date of admission: 06/23/2009  Date of discharge: 06/26/2009  Brief reason for admission/active problems: Angina  Followup needed: Chest pain- patient is started on aspirin 325 and plavix per cardiology. Reccomendation is to continue for a month, She is shcedule for cardiology followup as outpatient in a month.    The medication and problem lists have been updated.  Please see the dictated discharge summary for details.   Note- patient was cocaine and cannabis positive. As a consequence of [facility] pain contract violation, she is not prescribed any pain medication from the clinic  Medications: Added new medication of SIMVASTATIN 20 MG TABS (SIMVASTATIN) once daily - Signed Changed medication from SIMVASTATIN 20 MG TABS (SIMVASTATIN) once daily to LIPITOR 20 MG TABS (ATORVASTATIN CALCIUM) once daily - Signed Added new medication of ASPIRIN 325 MG TABS (ASPIRIN) Take 1 tablet by mouth once a day - Signed Added new medication of MS CONTIN 15 MG XR12H-TAB (MORPHINE SULFATE) Take 1 tablet by mouth two times a day - Signed Removed medication of ATENOLOL 50 MG  TABS (ATENOLOL) Take 1 tablet by mouth once a day - Signed Added new medication of NORVASC 5 MG TABS (AMLODIPINE BESYLATE) Take 1 tablet by mouth once a day - Signed Added new medication of PLAVIX 75 MG TABS (CLOPIDOGREL BISULFATE) Take 1 tablet by mouth once a day - Signed Changed medication from GABAPENTIN 300 MG CAPS (GABAPENTIN) 1 by mouth at bedtime x 1 wk; then 1 by mouth two times a day x 1 wk; then 1 by mouth tid to GABAPENTIN 300 MG CAPS (GABAPENTIN) Take 1 tablet by mouth three times a day - Signed Changed medication from VICODIN 5-500 MG  TABS (HYDROCODONE-ACETAMINOPHEN) Take 1 tablet by mouth as required for back pain, not more than four times a day. to VICODIN 5-500 MG  TABS (HYDROCODONE-ACETAMINOPHEN) Take 1 tablet by mouth as required for back pain, not more than four times a day. - Signed Added new medication  of LISINOPRIL 5 MG TABS (LISINOPRIL) once daily - Signed Removed medication of MS CONTIN 15 MG XR12H-TAB (MORPHINE SULFATE) Take 1 tablet by mouth two times a day - Signed Changed medication from LIPITOR 20 MG TABS (ATORVASTATIN CALCIUM) once daily to PRAVASTATIN SODIUM 20 MG TABS (PRAVASTATIN SODIUM) Take 1 tablet by mouth once a day - Signed Rx of SIMVASTATIN 20 MG TABS (SIMVASTATIN) once daily;  #30 x 0;  Signed;  Entered by: Bethel Born MD;  Authorized by: Bethel Born MD;  Method used: [facility] [phone]*, [address], [place], [place], [place]  [address], Ph: [phone], Fax: [phone] Rx of LIPITOR 20 MG TABS (ATORVASTATIN CALCIUM) once daily;  #30 x 0;  Signed;  Entered by: Bethel Born MD;  Authorized by: Bethel Born MD;  Method used: [facility]ically [facility] [phone]*, [address], [place], [place], [place]  [address], Ph: [phone], Fax: [phone] Rx of ASPIRIN 325 MG TABS (ASPIRIN) Take 1 tablet by mouth once a day;  #30 x 0;  Signed;  Entered by: Bethel Born MD;  Authorized by: Bethel Born MD;  Method used: [facility]ically [facility] [phone]*, [address], [place], [place], [place]  [address], Ph: [phone], Fax: [phone] Rx of NORVASC 5 MG TABS (AMLODIPINE BESYLATE) Take 1 tablet by mouth once a day;  #30 x 0;  Signed;  Entered by: Bethel Born MD;  Authorized by: Bethel Born MD;  Method used:  [facility] [id number] [address], [place], [place], [place]  [address], Ph: [phone], Fax: [phone] Rx of PLAVIX 75 MG TABS (CLOPIDOGREL BISULFATE) Take 1 tablet by mouth once a day;  #30 x 0;  Signed;  Entered by: Bethel Born MD;  Authorized by: Bethel Born MD;  Method used: [facility] [phone]*, [address] [place], [place], [place]  [address], Ph: [phone], Fax: [phone] Rx of LISINOPRIL 5 MG TABS (LISINOPRIL) once daily;   #30 x 0;  Signed;  Entered by: Bethel Born MD;  Authorized by: Bethel Born MD;  Method used: [facility]ically [facility] [phone]*, [address], [place], [place], [place]  [address], Ph: [phone], Fax: [phone] Rx of PRAVASTATIN SODIUM 20 MG TABS (PRAVASTATIN SODIUM) Take 1 tablet by mouth once a day;  #30 x 0;  Signed;  Entered by: Bethel Born MD;  Authorized by: Bethel Born MD;  Method used: [facility] [phone]*, [address], [place], [place], [place]  [address], Ph: [phone], Fax: [phone] Directives: Added new directive of VIOLATED PAIN CONTRACT- UDS COCAINE AND CANNABIS POSITIVE - Signed Observations: Added new observation of SMOK ADVICE: yes (06/26/2009 11:25) Added new observation of INSTRUCTIONS: Your next appointment with Dr. Polly Cobia is on April 11 at 1:30 pm.  You have an appointment with cardiologist at [facility] center on April 18th at 10:15 am  Tobacco is very bad for your health and your loved ones! You Should stop smoking!. Stop Smoking Tips: Choose a Quit date. Cut down before the Quit date. decide what you will do as a substitute when you feel the urge to smoke(gum,toothpick,exercise). It is important that you exercise regularly at least 20 minutes 5 times a week. If you develop chest pain, have severe difficulty breathing, or feel very tired , stop exercising immediately and seek medical attention. (06/26/2009 11:25)    Prescriptions: PRAVASTATIN SODIUM 20 MG TABS (PRAVASTATIN SODIUM) Take 1 tablet by mouth once a day  #30 x 0   Entered and Authorized by:   Bethel Born MD   Signed by:   Bethel Born MD on 06/27/2009   Method used:   [facility] to        [facility] [phone]* (retail)       [address]       [place], [place]  [address]       Ph: [phone]       Fax: [phone]   RxID:   [id number] LISINOPRIL 5 MG TABS (LISINOPRIL) once daily  #30 x 0    Entered and Authorized by:   Bethel Born MD   Signed by:   Bethel Born MD on 06/26/2009   Method used:   [facility]ronically to        [facility] [phone]* (retail)       [address]       [place], [place]  [address]       Ph: [phone]       Fax: [phone]   RxID:   [id number] PLAVIX 75 MG TABS (CLOPIDOGREL BISULFATE) Take 1 tablet by mouth once a day  #30 x 0   Entered and Authorized by:   Bethel Born MD   Signed by:   Bethel Born MD on 06/26/2009  Method used:   Electronically to        [facility]  [address] [facility] [address] [id number] (retail)       [address]       [place], [place]  [address]       Ph: [phone]       Fax: [phone]   RxID:   [id number] NORVASC 5 MG TABS (AMLODIPINE BESYLATE) Take 1 tablet by mouth once a day  #30 x 0   Entered and Authorized by:   Bethel Born MD   Signed by:   Bethel Born MD on 06/26/2009   Method used:   Electronically to        [facility] [phone]* (retail)       [address]       [place], [place]  [address]       Ph: [phone]       Fax: [phone]   RxID:   [id number] ASPIRIN 325 MG TABS (ASPIRIN) Take 1 tablet by mouth once a day  #30 x 0   Entered and Authorized by:   Bethel Born MD   Signed by:   Bethel Born MD on 06/26/2009   Method used:   Electronically to        [facility]  [address] [phone]* (retail)       [address]       [place], [place]  [address]       Ph: [phone]       Fax: [phone]   RxID:   [id number] LIPITOR 20 MG TABS (ATORVASTATIN CALCIUM) once daily  #30 x 0   Entered and Authorized by:   Bethel Born MD   Signed by:   Bethel Born MD on 06/26/2009   Method used:   [facility]        [facility] [phone]* (retail)       [address]       [place], [place]  [address]       Ph: [phone]       Fax: [phone]   RxID:    [id number] SIMVASTATIN 20 MG TABS (SIMVASTATIN) once daily  #30 x 0   Entered and Authorized by:   Bethel Born MD   Signed by:   Bethel Born MD on 06/26/2009   Method used:   [facility]ron[facility] to        [facility] [phone]* (retail)       [address]       [place], [place]  [address]       Ph: [phone]       Fax: [phone]   RxID:   [id number]    Patient Instructions: 1)  Your next appointment with Dr. Polly Cobia is on April 11 at 1:30 pm.  You have an appointment with cardiologist at [facility] center on April 18th at 10:15 am  2)  Tobacco is very bad for your health and your loved ones! You Should stop smoking!. 3)  Stop Smoking Tips: Choose a Quit date. Cut down before the Quit date. decide what you will do as a substitute when you feel the urge to smoke(gum,toothpick,exercise). 4)  It is important that you exercise regularly  at least 20 minutes 5 times a week. If you develop chest pain, have severe difficulty breathing, or feel very tired , stop exercising immediately and seek medical attention.
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
## 2010-05-13 NOTE — Progress Notes (Signed)
Summary: Refill/gh  Phone Note Refill Request Message from:  Patient on October 30, 2009 11:24 AM  Refills Requested: Medication #1:  KLONOPIN 0.5 MG  TABS Take 1 tablet by mouth two times a day Pt said that the Vicodin does not work.  Said that she wa given Vicodin in the hospital and discharged on the Vicodin.   Method Requested: Electronic Initial call taken by: Angelina Ok RN,  October 30, 2009 11:28 AM  Follow-up for Phone Call        Per the discharge records she was not put on vicodin. The refill request is for Klonopin? She has a history of substance abuse so she will need to be seen for any medication changes. Follow-up by: Julaine Fusi  DO,  October 30, 2009 11:54 AM  Additional Follow-up for Phone Call Additional follow up Details #1::        Pt was informed that she would have to have an apppointment before getting refills on the Vicodin or the Klonopin.  Pt was informed that an appointment would be needed before she would be given any pain meds  due to some previous abuse of the medications.  Pt said that she accidently abused the medications due to her pain.  Pt said that she was given the Vicodin in the hospital along with the Morphine for her pneumonia and pain while in the hospital.  Was given the medication IV.  Pt said that she was also given prescriptions for the Vicodin at discharge.  Pt also stated that she has also gotten prescriptions for the Percocet since her discharge.   Pt was asked to bring in her empty bottle for the Percocet and Vicodin she has received since discharge.  Pt said that she did not have the bottles.  Pt was walked to the front area to schedul an appointment to be seen in the Clinics today for assessment of her pain needs.  Pt sid that she could not be seen today. Pt also later said that she did not want to be seen by hre new doctor-since she did not know her.  Pt said that she would make an appointment for next week.  Pt was agitaed and said that she needed to  get her pain medicine and that by the time she is seen she would be dead.  Pt also said tthat she was going to the ER and did not want an appointment in the Clinics. Angelina Ok RN  October 30, 2009 12:24 PM  Additional Follow-up by: Angelina Ok RN,  October 30, 2009 12:24 PM    Additional Follow-up for Phone Call Additional follow up Details #2::    I reviewed this occurence. Red flag behavior. No Vicodin or percocet is list on hospital discharge. Numerous discussions about inappropriate pain medication requests. Hx of PSA, early refills, lost scripts and most importantly no clear indication for opiates. Contract violations documented. No opiates should be given to this patient from our clinic. Follow-up by: Julaine Fusi  DO,  October 30, 2009 12:32 PM
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
## 2010-05-13 NOTE — Progress Notes (Signed)
Summary: phone/gg  Phone Note Call from Patient   Caller: Patient Summary of Call: Pt c/o 3 days of chest pressure and pain. She was up all night last night.  Denies nausea but has weakness, SOB, diaphoresis and numbness in  left hand. Hx of angina, pneumonia She is out of nitro tabs  Pt advised to go to ED for evaluation Patient/caller verbalizes understanding of these instructions.  Initial call taken by: Merrie Roof RN,  December 12, 2009 9:05 AM  Follow-up for Phone Call        I agree. Follow-up by: Zoila Shutter MD,  December 12, 2009 11:01 AM
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
## 2010-05-13 NOTE — Progress Notes (Signed)
Summary: refill/gg  Phone Note Refill Request  on Sep 04, 2009 5:07 PM  Refills Requested: Medication #1:  PRAVASTATIN SODIUM 20 MG TABS Take 1 tablet by mouth once a day   Last Refilled: 07/12/2009  Medication #2:  NORVASC 5 MG TABS Take 1 tablet by mouth once a day   Last Refilled: 07/12/2009  Medication #3:  PLAVIX 75 MG TABS Take 1 tablet by mouth once a day  Method Requested: Fax to Local Pharmacy Initial call taken by: Merrie Roof RN,  Sep 04, 2009 5:07 PM    Prescriptions: PLAVIX 75 MG TABS (CLOPIDOGREL BISULFATE) Take 1 tablet by mouth once a day  #30 x 0   Entered and Authorized by:   Zara Council MD   Signed by:   Zara Council MD on 09/05/2009   Method used:   Faxed to ...       [facility] Department (retail)       [address] [place], [place]  [address]       Ph: [phone]       Fax: [phone]   RxID:   [id number] NORVASC 5 MG TABS (AMLODIPINE BESYLATE) Take 1 tablet by mouth once a day  #30 x 11   Entered and Authorized by:   Zara Council MD   Signed by:   Zara Council MD on 09/05/2009   Method used:   Faxed to ...       [facility] Department (retail)       [address] [place], [place]  [address]       Ph: [phone]       Fax: [phone]   RxID:   [id number] PRAVASTATIN SODIUM 20 MG TABS (PRAVASTATIN SODIUM) Take 1 tablet by mouth once a day  #30 x 11   Entered and Authorized by:   Zara Council MD   Signed by:   Zara Council MD on 09/05/2009   Method used:   Faxed to ...       [facility] Department (retail)       [address] [place], [place]  [address]       Ph: [phone]       Fax: [phone]   RxID:   [id number]

## 2010-05-13 NOTE — Discharge Summary (Signed)
Summary: Hospital Discharge Update    Hospital Discharge Update:  Date of Admission: 09/20/2009 Date of Discharge: 09/21/2009  Brief Summary:  1) Chest pain- Most likely 2/2 cocaine. Cardiac enzymes negative X 3. Discharge on aspirin. Will not discharge on plavix due to patient nonadherence. Not a candidate for beta blocker.  2) Loss of Consciousness- Likely related to PSA and alcohol abuse. Patient alert and oriented X 3 on day of discharge. No head trauma. No further imaging required.   3) PNA- Wll discharge on Doxycyclilne for 7 days. Pt afebrile, with no elevation of WBC on discharge.   4) HTN- continue hctz and lisinopril.  5) Depression- continue celexa.  6) Lack of resources- Will have patient be seen at the clinic by Dorothe Pea for further discussion of possible available resources.     Other follow-up issues:  Adherence to medication. Monitor BP.  Medication list changes:  Changed medication from CELEXA 40 MG  TABS (CITALOPRAM HYDROBROMIDE) Take 1and a half  tablet by mouth once a day to CELEXA 20 MG TABS (CITALOPRAM HYDROBROMIDE) Take 1 tablet by mouth once a day - Signed Removed medication of PLAVIX 75 MG TABS (CLOPIDOGREL BISULFATE) Take 1 tablet by mouth once a day Added new medication of DOXYCYCLINE HYCLATE 100 MG CAPS (DOXYCYCLINE HYCLATE) Take 1 tablet by mouth once a day X 7 days - Signed Rx of CELEXA 20 MG TABS (CITALOPRAM HYDROBROMIDE) Take 1 tablet by mouth once a day;  #30 x 3;  Signed;  Entered by: Laren Everts MD;  Authorized by: Laren Everts MD;  Method used: [id number] [address] [place], [place], [place]  [address], Ph: [phone], Fax: [phone] Rx of DOXYCYCLINE HYCLATE 100 MG CAPS (DOXYCYCLINE HYCLATE) Take 1 tablet by mouth once a day X 7 days;  #7 x 0;  Signed;  Entered by: Laren Everts MD;  Authorized by: Laren Everts MD;  Method used: [id number]*, [address], [place], [place], [place]  [address], Ph: [phone], Fax: [phone]  The medication, problem, and allergy lists have been updated.  Please see the dictated discharge summary for details.  Discharge medications:  CELEXA 20 MG TABS (CITALOPRAM HYDROBROMIDE) Take 1 tablet by mouth once a day HYDROCHLOROTHIAZIDE 25 MG  TABS (HYDROCHLOROTHIAZIDE) Take 1 tablet by mouth once a day KLONOPIN 0.5 MG  TABS (CLONAZEPAM) Take 1 tablet by mouth two times a day MIRENA 20 MCG/24HR IUD (LEVONORGESTREL) Per [facility] GABAPENTIN 300 MG CAPS (GABAPENTIN) Take 1 tablet by mouth three times a day MOBIC 7.5 MG TABS (MELOXICAM) Please take one to two pill once daily, with food. CLARITIN 10 MG TABS (LORATADINE) Take 1 tablet by mouth once a day PRAVASTATIN SODIUM 20 MG TABS (PRAVASTATIN SODIUM) Take 1 tablet by mouth once a day ASPIRIN 325 MG TABS (ASPIRIN) Take 1 tablet by mouth once a day NORVASC 5 MG TABS (AMLODIPINE BESYLATE) Take 1 tablet by mouth once a day LISINOPRIL 5 MG TABS (LISINOPRIL) once daily ULTRAM 50 MG TABS (TRAMADOL HCL) Take 1 tablet by mouth four times a day, as needed for back pain DOXYCYCLINE HYCLATE 100 MG CAPS (DOXYCYCLINE HYCLATE) Take 1 tablet by mouth once a day X 7 days  Other patient instructions:  Please return to the clinc early next week to discuss available resources with [facility].  Take all medication as directed. Will schedule a hospital follow up at the Clinic in around 2 weeks.  When you go to the clinic early next week ask about the details of the date/time of the appointment.    Note: Hospital Discharge Medications & Other Instructions handout was printed, one copy for patient and a second copy to be placed in hospital chart.

## 2010-05-13 NOTE — Miscellaneous (Signed)
  Clinical Lists Changes  Observations: Added new observation of ECHOINTERP:  - Left ventricle: The cavity size was normal. Wall thickness was     normal. Systolic function was normal. The estimated ejection     fraction was in the range of 55% to 60%.   - Aortic valve: Mild regurgitation.   - Mitral valve: Mild regurgitation.   - Left atrium: The atrium was mildly dilated.   - Atrial septum: No defect or patent foramen ovale was identified. (02/05/2010 9:10)      Echocardiogram  Procedure date:  02/05/2010  Findings:       - Left ventricle: The cavity size was normal. Wall thickness was     normal. Systolic function was normal. The estimated ejection     fraction was in the range of 55% to 60%.   - Aortic valve: Mild regurgitation.   - Mitral valve: Mild regurgitation.   - Left atrium: The atrium was mildly dilated.   - Atrial septum: No defect or patent foramen ovale was identified.

## 2010-05-13 NOTE — Miscellaneous (Signed)
Summary: Consents: Medication Contract   Consents: Medication Contract   Imported By: Florinda Marker 01/11/2007 15:19:50  _____________________________________________________________________  External Attachment:    Type:   Image     Comment:   External Document
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
## 2010-05-13 NOTE — Progress Notes (Signed)
Summary: med refill/gp  Phone Note Refill Request Message from:  Patient on February 03, 2010 9:54 AM  Refills Requested: Medication #1:  ULTRAM 50 MG TABS Take 1 tablet by mouth four times a day Last appt. 9/21 w/labs;  next appt. 12/15.   Method Requested: Telephone to Pharmacy Initial call taken by: Chinita Pester RN,  February 03, 2010 9:54 AM  Follow-up for Phone Call        Reviewed last refills and office visit.  Will provide refill again, but needs to be reassessed at next visit to determine continued need for ultram. Follow-up by: Mariea Stable MD,  February 03, 2010 10:40 AM  Additional Follow-up for Phone Call Additional follow up Details #1::        Rx called to pharmacy - [facility] pharmacy. Additional Follow-up by: Chinita Pester RN,  February 03, 2010 12:00 PM    Prescriptions: ULTRAM 50 MG TABS (TRAMADOL HCL) Take 1 tablet by mouth four times a day, as needed for back pain  #120 x 3   Entered by:   Mariea Stable MD   Authorized by:   Marland Kitchen [facility] ATTENDING DESKTOP   Signed by:   Mariea Stable MD on 02/03/2010   Method used:   Telephoned to ...       [facility] Department (retail)       [address] [place], [place]  [address]       Ph: [phone]       Fax: [phone]   RxID:   [id number]

## 2010-05-13 NOTE — Discharge Summary (Signed)
Summary: Hospital Discharge Update    Hospital Discharge Update:  Date of Admission: 02/04/2010 Date of Discharge: 02/07/2010  Brief Summary:  Pt is a 50 yo female with PMHx of Nonobstructive CAD, PSA, HTN, chronic back pain who was a direct admit from the clinic for evaluation and treatment of Right flank pain and increased urinary frequency that had been ongoing despite treatment in the ED for MDR E.coli with Cipro, then Macrobid. Pt was admitted for further evalation and treatment, with the thought that Macrobid may not have completely resolved the infection as it does not penetrate the upper urinary tract. Repeat UA did show microscopic hematuria, and Dr. Maurice March was curbsided and recommended 2 options for therapy. #1 f/u outpt, #2 go ahead and treat with Ertapenem and monitor symptoms. We treated for 3 days with Ertapenem. Of note, repeat urine culture was negative. Also, pt found to have abn EKG with inverted T waves and QT prolongation. Her outpt cardiologist curbsided and he was aware of changes, and had planned outpt stress test. As pt was hospitalized, stress test performed in house, and was normal.  Pt's chronic back pain, rib pain, and flank pain were investigated with rib XRay, thoracic spine xray, cxr - all of which were normal.  Other labs needed at follow-up: consider UA - recheck for microscopic hematuria  Problem list changes:  Removed problem of PYELONEPHRITIS, ACUTE (ICD-590.10) - Signed  Medication list changes:  Added new medication of FLEXERIL 5 MG TABS (CYCLOBENZAPRINE HCL) Take 1 tablet by mouth three times a day - Signed Rx of FLEXERIL 5 MG TABS (CYCLOBENZAPRINE HCL) Take 1 tablet by mouth three times a day;  #90 x 1;  Signed;  Entered by: Johnette Abraham DO;  Authorized by: Johnette Abraham DO;  Method used: Print then Give to Patient Rx of FLEXERIL 5 MG TABS (CYCLOBENZAPRINE HCL) Take 1 tablet by mouth three times a day;  #90 x 1;  Signed;  Entered by: Johnette Abraham DO;  Authorized by: Johnette Abraham DO;  Method used: Reprint  Discharge medications:  CELEXA 20 MG TABS (CITALOPRAM HYDROBROMIDE) Take 1 tablet by mouth once a day HYDROCHLOROTHIAZIDE 25 MG  TABS (HYDROCHLOROTHIAZIDE) Take 1 tablet by mouth once a day KLONOPIN 0.5 MG  TABS (CLONAZEPAM) Take 1 tablet by mouth two times a day MIRENA 20 MCG/24HR IUD (LEVONORGESTREL) Per [facility] GABAPENTIN 300 MG CAPS (GABAPENTIN) Take 1 tablet by mouth three times a day PRAVASTATIN SODIUM 80 MG TABS (PRAVASTATIN SODIUM) Take one tablet by mouth daily at bedtime ASPIRIN 325 MG TABS (ASPIRIN) Take 1 tablet by mouth once a day LISINOPRIL 20 MG TABS (LISINOPRIL) Take 1 tablet by mouth once a day ULTRAM 50 MG TABS (TRAMADOL HCL) Take 2 tablets by mouth four times a day, as needed for back pain MULTIVITAMINS  TABS (MULTIPLE VITAMIN) Take 1 tablet by mouth once a day FISH OIL 1000 MG CAPS (OMEGA-3 FATTY ACIDS) 6 tabs by mouth daily COQ-10 100 MG CAPS (COENZYME Q10) take 1 tablet daily FLEXERIL 5 MG TABS (CYCLOBENZAPRINE HCL) Take 1 tablet by mouth three times a day  Other patient instructions:  1)  Please follow-up at the clinic on 03/27/10 at 3:00PM, at which time we will reevaluate your pain and other preventative health issues. 2)  Please follow-up with your cardiologist, Dr. Shirlee Latch on 02/17/10 at 09:30AM. 3) You have been started on Flexeril, If you develop rash, difficulty breathing, shortness of breath, please discontinue the medication and call the clinic at [phone]. Do  not take with alcohol, do not drive or operate heavy machinery if it causes sleepiness.  4) If you have worsening of your symptoms, develop chest pain, difficulty breathing, weakness, please call the clinic at [phone] or go to the ER.    Note: Hospital Discharge Medications & Other Instructions handout was printed, one copy for patient and a second copy to be placed in hospital  chart.  Prescriptions: FLEXERIL 5 MG TABS (CYCLOBENZAPRINE HCL) Take 1 tablet by mouth three times a day  #90 x 1   Entered and Authorized by:   Johnette Abraham DO   Signed by:   Johnette Abraham DO on 02/07/2010   Method used:   Reprint   RxID:   [id number] FLEXERIL 5 MG TABS (CYCLOBENZAPRINE HCL) Take 1 tablet by mouth three times a day  #90 x 1   Entered and Authorized by:   Johnette Abraham DO   Signed by:   Johnette Abraham DO on 02/07/2010   Method used:   Print then Give to Patient   RxID:   [id number]

## 2010-05-13 NOTE — Progress Notes (Signed)
Summary: Denied narcotics/behavior discussed  Phone Note Call from Patient   Caller: Patient Details for Reason: Wanted narcotics that are denied Summary of Call: Admin note Re: Behavior Ms. Tenpas was very upset and could be heard using unacceptable language toward the clinic staff and using derogatory comments about the clinic in front of other employees and patients.  It was loud enough and ugly enough that I left my office during a conversation to see if I could help.  I made contact outside the east elevators.  She cussed me, the triage nurse, the doctors etc  Related same position as previously noted in chart.  I was ineffective in calming the patient and she entered the elevator and cussed as the doors closed.    This behavior certainly crossed the line for an unhappt patient of reasonable disposition and I do not expect anyone to tolerate this further abuse.  Due to her medical hx I would suggest I warn her of her bahavior at her next visit and discuss the situation with her pcp and medical dir. as needed.  Failure to comply would result in dismissal from the practice.  Initial call taken by: Raynaldo Opitz,  October 31, 2009 3:36 PM

## 2010-05-13 NOTE — Miscellaneous (Signed)
Summary: Appointment No Show  Appointment status changed to no show by [employer] on 02/07/2010 10:37 AM.  No Show Comments ---------------- echo dx 410.90/[facility] sliding scale/sl  Appointment Information ----------------------- Appt Type:  CARDIOLOGY ANCILLARY VISIT      Date:  Friday, February 07, 2010      Time:  8:30 AM for 60 min   Urgency:  Routine   Made By:  Hoy Finlay Scheduler  To Visit:  [id number]    Reason:  echo dx 410.90/[facility] sliding scale/sl  Appt Comments ------------- -- 02/07/10 10:37: ([facility]) NO SHOW -- echo dx 410.90/[facility] sliding scale/sl -- 01/24/10 9:49: ([facility]) BOOKED -- Routine CARDIOLOGY ANCILLARY VISIT at 02/07/2010 8:30 AM for 60 min echo dx 410.90/[facility] sliding scale/sl -- 01/24/10 9:32: ([facility])

## 2010-05-13 NOTE — Progress Notes (Signed)
Summary: phone/gg  Phone Note Refill Request   Refills Requested: Medication #1:  VICODIN 5-500 MG  TABS Take 1 tablet by mouth as required for back pain   Last Refilled: 06/18/2009 Pt states she has been taking 2 pills 4 times a day.   Not due for another week.   She just got out of hospital on 3/17 Dx with  cocaine-induced coronary vasospasm   Method Requested: Telephone to Pharmacy Initial call taken by: Merrie Roof RN,  July 12, 2009 10:56 AM  Follow-up for Phone Call        Rx denied because patient was "cocaine positive" in the hospital. Please ask patient to keep up the follow up appointment. I will ask attending about our options.  Follow-up by: Zara Council MD,  July 12, 2009 12:48 PM  Additional Follow-up for Phone Call Additional follow up Details #1::        [facility] informed Merrie Roof RN  July 12, 2009 5:02 PM
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
## 2010-05-13 NOTE — Progress Notes (Signed)
Summary: refill/ hla  Phone Note Refill Request Message from:  Patient on May 07, 2009 12:00 PM  Refills Requested: Medication #1:  VICODIN 5-500 MG  TABS Take 1 tablet by mouth as required for back pain   Last Refilled: 04/18/2009 Initial call taken by: Marin Roberts RN,  May 07, 2009 12:02 PM  Follow-up for Phone Call        Rx called to pharmacy Follow-up by: Marin Roberts RN,  May 09, 2009 10:37 AM    New/Updated Medications: VICODIN 5-500 MG  TABS (HYDROCODONE-ACETAMINOPHEN) Take 1 tablet by mouth as required for back pain, not more than four times a day. Prescriptions: VICODIN 5-500 MG  TABS (HYDROCODONE-ACETAMINOPHEN) Take 1 tablet by mouth as required for back pain, not more than four times a day.  #60 x 0   Entered and Authorized by:   Zara Council MD   Signed by:   Zara Council MD on 05/08/2009   Method used:   Telephoned to ...       [facility] (retail)       [address] [place], [place]  [address]       Ph: [phone]       Fax: [phone]   RxID:   [id number]

## 2010-05-15 NOTE — Progress Notes (Signed)
Summary: Pt need samples of all medication  Phone Note Call from Patient Call back at Home Phone [phone] Call back at [phone]   Caller: Patient Summary of Call: Pt need samples of Norvasc,Nitro,and all other medication Initial call taken by: Judie Grieve,  April 25, 2010 8:32 AM     Appended Document: Pt need samples of all medication see if we can get her any of this.   Appended Document: Pt need samples of all medication LMTCB   Appended Document: Pt need samples of all medication pt notified we do not samples of any of her heart medication
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
## 2010-05-15 NOTE — Medication Information (Signed)
Summary: CONTROLLED MEDICATION   CONTROLLED MEDICATION   Imported By: Margie Billet 04/18/2010 13:59:27  _____________________________________________________________________  External Attachment:    Type:   Image     Comment:   External Document
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
## 2010-05-15 NOTE — Progress Notes (Signed)
Summary: rtn anne's call  Phone Note Call from Patient   Caller: Patient Summary of Call: pt calling anne back-pls call [phone] ok to leave message Initial call taken by: Glynda Jaeger,  April 28, 2010 2:26 PM  Follow-up for Phone Call        [facility] Katina Dung, RN, BSN  April 28, 2010 3:09 PM --I talked with pt--pt is aware that we do not have samples of any of her heart medications

## 2010-05-20 ENCOUNTER — Ambulatory Visit: Payer: Self-pay | Admitting: Cardiology

## 2010-05-20 ENCOUNTER — Other Ambulatory Visit: Payer: Self-pay | Admitting: *Deleted

## 2010-05-20 NOTE — Telephone Encounter (Signed)
Last refill 04/22/10  Call pt when ready

## 2010-05-22 ENCOUNTER — Ambulatory Visit: Payer: Self-pay | Admitting: Internal Medicine

## 2010-05-22 ENCOUNTER — Ambulatory Visit (INDEPENDENT_AMBULATORY_CARE_PROVIDER_SITE_OTHER): Payer: PRIVATE HEALTH INSURANCE | Admitting: Cardiology

## 2010-05-22 ENCOUNTER — Encounter: Payer: Self-pay | Admitting: Cardiology

## 2010-05-22 DIAGNOSIS — I2510 Atherosclerotic heart disease of native coronary artery without angina pectoris: Secondary | ICD-10-CM

## 2010-05-23 ENCOUNTER — Other Ambulatory Visit: Payer: Self-pay | Admitting: *Deleted

## 2010-05-23 ENCOUNTER — Other Ambulatory Visit: Payer: PRIVATE HEALTH INSURANCE

## 2010-05-23 MED ORDER — HYDROCODONE-ACETAMINOPHEN 5-500 MG PO TABS: 1.0000 | ORAL_TABLET | Freq: Two times a day (BID) | ORAL | Status: DC | PRN

## 2010-05-23 NOTE — Telephone Encounter (Signed)
Checking for vicodin request

## 2010-05-24 ENCOUNTER — Other Ambulatory Visit: Payer: Self-pay | Admitting: Internal Medicine

## 2010-05-26 ENCOUNTER — Telehealth: Payer: Self-pay | Admitting: *Deleted

## 2010-05-26 ENCOUNTER — Ambulatory Visit (HOSPITAL_COMMUNITY)
Admission: RE | Admit: 2010-05-26 | Discharge: 2010-05-26 | Disposition: A | Discharge status: Home / Self Care | Payer: PRIVATE HEALTH INSURANCE | Source: Ambulatory Visit | Attending: Internal Medicine | Admitting: Internal Medicine

## 2010-05-26 ENCOUNTER — Ambulatory Visit (INDEPENDENT_AMBULATORY_CARE_PROVIDER_SITE_OTHER): Payer: PRIVATE HEALTH INSURANCE | Admitting: Internal Medicine

## 2010-05-26 ENCOUNTER — Encounter: Payer: Self-pay | Admitting: Internal Medicine

## 2010-05-26 VITALS — BP 152/92 | HR 122 | Temp 98.6°F | Ht 63.0 in | Wt 121.1 lb

## 2010-05-26 DIAGNOSIS — R059 Cough, unspecified: Secondary | ICD-10-CM | POA: Insufficient documentation

## 2010-05-26 DIAGNOSIS — J438 Other emphysema: Secondary | ICD-10-CM | POA: Insufficient documentation

## 2010-05-26 DIAGNOSIS — I10 Essential (primary) hypertension: Secondary | ICD-10-CM

## 2010-05-26 DIAGNOSIS — R05 Cough: Secondary | ICD-10-CM

## 2010-05-26 DIAGNOSIS — R509 Fever, unspecified: Secondary | ICD-10-CM | POA: Insufficient documentation

## 2010-05-26 DIAGNOSIS — F329 Major depressive disorder, single episode, unspecified: Secondary | ICD-10-CM

## 2010-05-26 MED ORDER — CLONAZEPAM 0.5 MG PO TABS: 0.5000 mg | ORAL_TABLET | Freq: Two times a day (BID) | ORAL | Status: DC | PRN

## 2010-05-26 MED ORDER — HYDROCODONE-ACETAMINOPHEN 5-500 MG PO TABS: 1.0000 | ORAL_TABLET | Freq: Two times a day (BID) | ORAL | Status: AC | PRN

## 2010-05-26 MED ORDER — ONDANSETRON HCL 4 MG PO TABS: 4.0000 mg | ORAL_TABLET | Freq: Every day | ORAL | Status: DC | PRN

## 2010-05-26 MED ORDER — AZITHROMYCIN 250 MG PO TABS: ORAL_TABLET | ORAL | Status: DC

## 2010-05-26 MED ORDER — CITALOPRAM HYDROBROMIDE 20 MG PO TABS: 20.0000 mg | ORAL_TABLET | Freq: Every day | ORAL | Status: DC

## 2010-05-26 MED ORDER — AZITHROMYCIN 250 MG PO TABS: ORAL_TABLET | ORAL | Status: AC

## 2010-05-26 NOTE — Assessment & Plan Note (Signed)
Slightly above the goal and could be related to her current sickness, will make no changes to her regimen today and will continue to follow up. If she remains persistently HTN-ive will make necessary changes.

## 2010-05-26 NOTE — Telephone Encounter (Signed)
Pt call late this am, c/o that she had been exposed to pneumonia and needing her controlled substances refilled, clonazepam was called to [facility]. She wishes to be seen asap. Spoke w/ dr Phillips Odor, pt was ask to be at clinic by 1315 and stated she had to wait on a ride and couldn't. Called her back after speaking w/ dr Phillips Odor again and got vmail, pt called back and states she is on the [facility] at this time and is "terribly sick", dr Aldine Contes had a cancellation and pt is placed in that appt

## 2010-05-26 NOTE — Patient Instructions (Signed)
Please come back within next week if your symptoms do not get better or get worse. Please start taking antibiotics today and if you experience worsening shortness of breath go to ED immediately.

## 2010-05-26 NOTE — Assessment & Plan Note (Signed)
Well controlled on current medication regimen, will make no changes today.

## 2010-05-26 NOTE — Assessment & Plan Note (Signed)
Based on her symptoms and risk factor of sick contacts at the shelter house, we will go ahead and treat as presumptive community acquired PNA. Will treat with course of zithromax and I have also ordered CXR to rule out other potential pulmonary complications. Her lungs sound rather good with no wheezing or crackles. I have advised patient that if her symptoms do not improve or get worse she needs to come back here or go to ED. Will follow up on CXR.

## 2010-05-26 NOTE — Progress Notes (Signed)
  Subjective:    Patient ID: Brenda [name], female    DOB: Mar 12, 1961, 50 y.o.   MRN: [id number]  HPI  Pt is 50 yo female with PMH outlined below who presents to [facility] with main concern of productive cough of greenish to yellowish sputum 1 week in duration. It is not getting better, she has fevers and chills associated with this, nausea and coughing is so bothersome that occasionaly she vomits. She has had similar episodes in the past and thinks she might have PNA and she does reports sick contacts at the shelter place. She denies any specific abdominal or urinary concerns, no blood in urine or stool, no chest pain other than when coughing, no shortness of breath and no palpitations. Also denies any additional systemic symptoms.   Review of Systems  Constitutional: Positive for fever, chills, diaphoresis and fatigue. Negative for activity change and appetite change.  HENT: Negative.   Eyes: Negative.   Respiratory: Positive for cough. Negative for apnea, choking, chest tightness, shortness of breath, wheezing and stridor.   Cardiovascular: Negative.   Gastrointestinal: Negative.   Genitourinary: Negative.   Psychiatric/Behavioral: Negative.        Objective:   Physical Exam  Constitutional: She appears well-developed and well-nourished. No distress.  HENT:  Head: Normocephalic and atraumatic.  Right Ear: External ear normal.  Left Ear: External ear normal.  Nose: Nose normal.  Mouth/Throat: Oropharynx is clear and moist. No oropharyngeal exudate.  Eyes: Conjunctivae and EOM are normal. Pupils are equal, round, and reactive to light. Right eye exhibits no discharge. Left eye exhibits no discharge. No scleral icterus.  Neck: Normal range of motion. Neck supple. No JVD present. No tracheal deviation present. No thyromegaly present.  Cardiovascular: Normal rate, regular rhythm, normal heart sounds and intact distal pulses.  Exam reveals no gallop and no friction rub.   No murmur  heard. Pulmonary/Chest: Effort normal and breath sounds normal. No stridor. No respiratory distress. She has no wheezes. She has no rales. She exhibits no tenderness.  Abdominal: Soft. Bowel sounds are normal. She exhibits no distension and no mass. There is no tenderness. There is no rebound and no guarding.  Lymphadenopathy:    She has no cervical adenopathy.  Skin: Skin is warm and dry. No rash noted. She is not diaphoretic. No erythema. No pallor.  Psychiatric: She has a normal mood and affect. Her behavior is normal. Judgment and thought content normal.          Assessment & Plan:

## 2010-05-27 NOTE — Telephone Encounter (Signed)
Called to pharm 

## 2010-05-27 NOTE — Telephone Encounter (Signed)
Reviewed

## 2010-05-28 NOTE — Telephone Encounter (Signed)
Will submit refill, as was recently refilled by Dr. Aldine Contes on 05/26/10.  KALIA-REYNOLDS,M SHELLY

## 2010-05-28 NOTE — Telephone Encounter (Signed)
This refill was NOT called in as pt received Rx during office visit. MD aware.
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
## 2010-05-31 ENCOUNTER — Ambulatory Visit (HOSPITAL_COMMUNITY)
Admission: EM | Admit: 2010-05-31 | Discharge: 2010-05-31 | Disposition: A | Discharge status: Home / Self Care | Payer: Self-pay | Attending: Emergency Medicine | Admitting: Emergency Medicine

## 2010-05-31 DIAGNOSIS — R079 Chest pain, unspecified: Secondary | ICD-10-CM | POA: Insufficient documentation

## 2010-05-31 DIAGNOSIS — R030 Elevated blood-pressure reading, without diagnosis of hypertension: Secondary | ICD-10-CM | POA: Insufficient documentation

## 2010-06-01 IMAGING — CR DG CHEST 2V
2 series · 2 of 2 positions shown · non-contrast
Comparison: 10/22/2005

CLINICAL DATA: Chest pain

CHEST - 2 VIEW

[w chest pa]
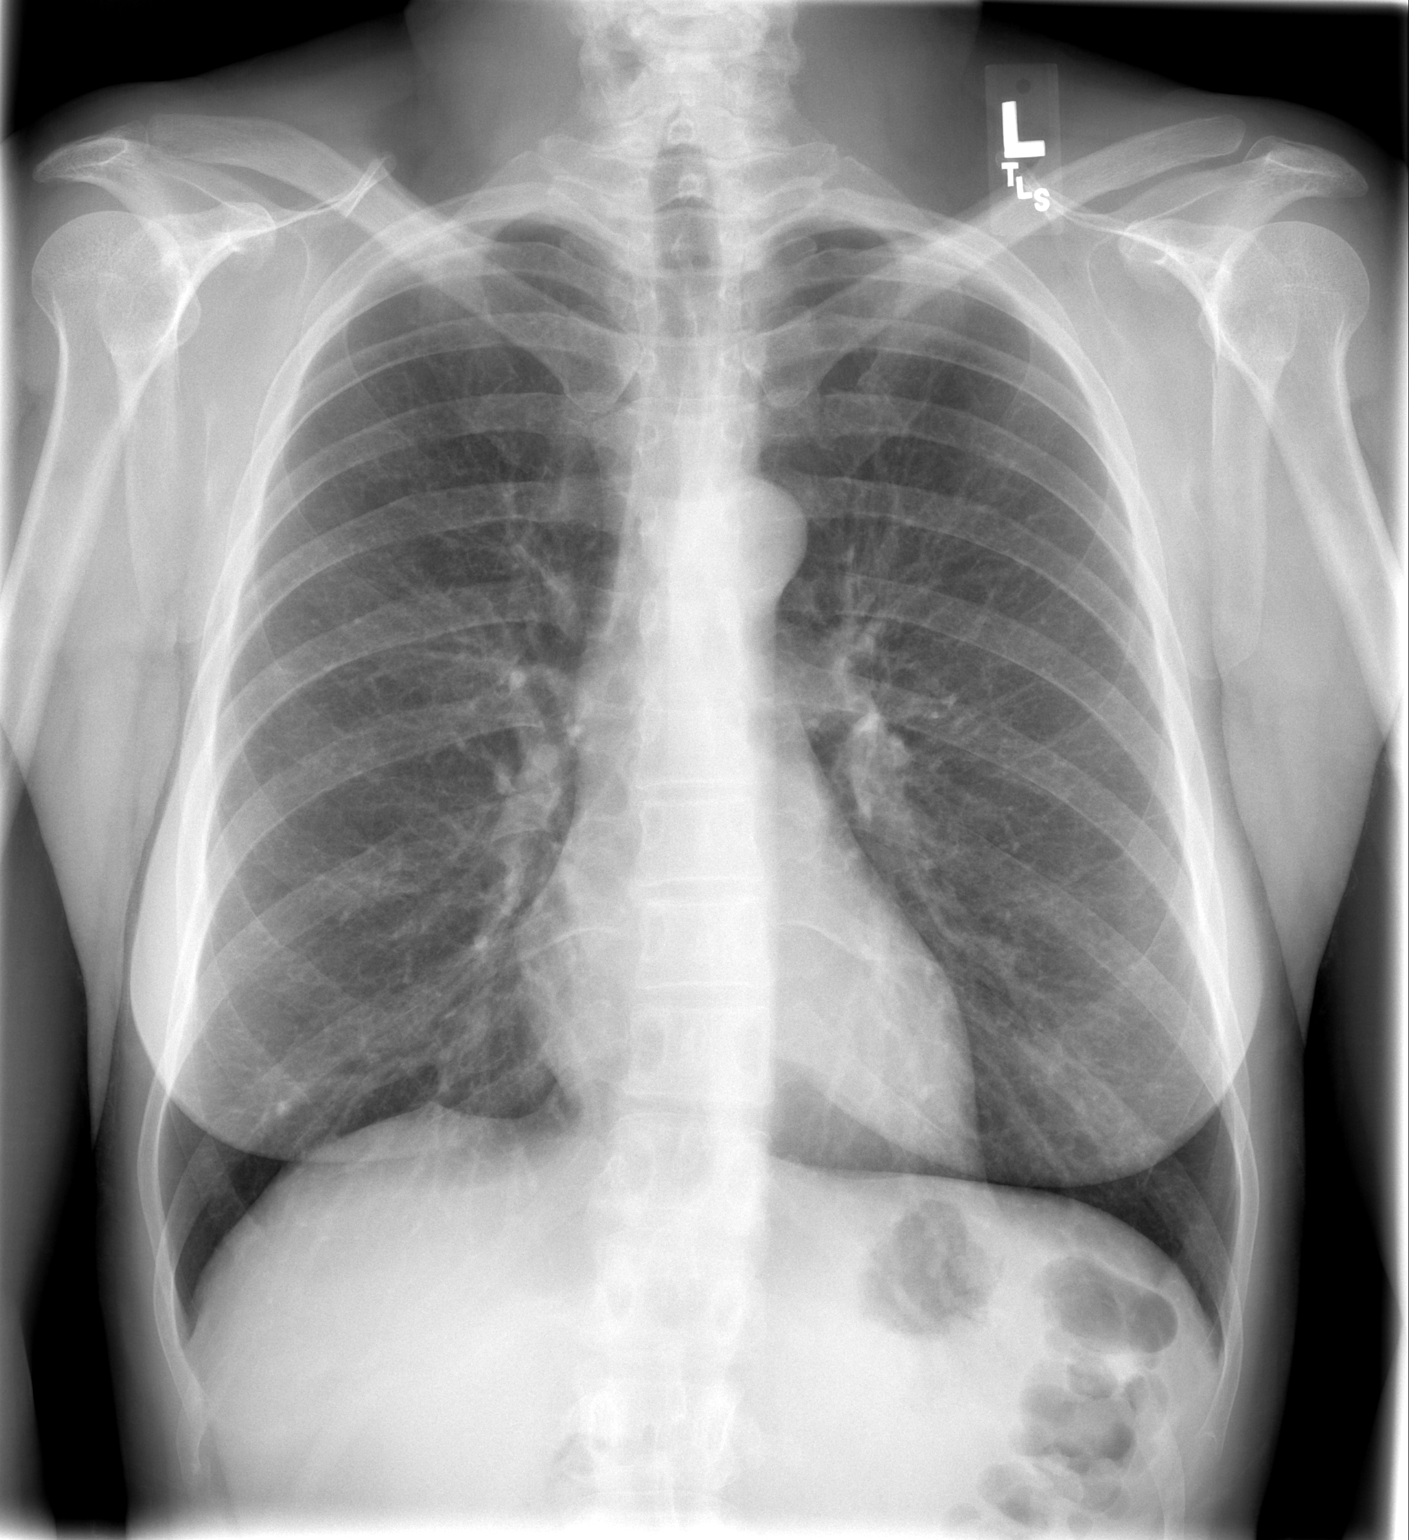

[w chest lat]
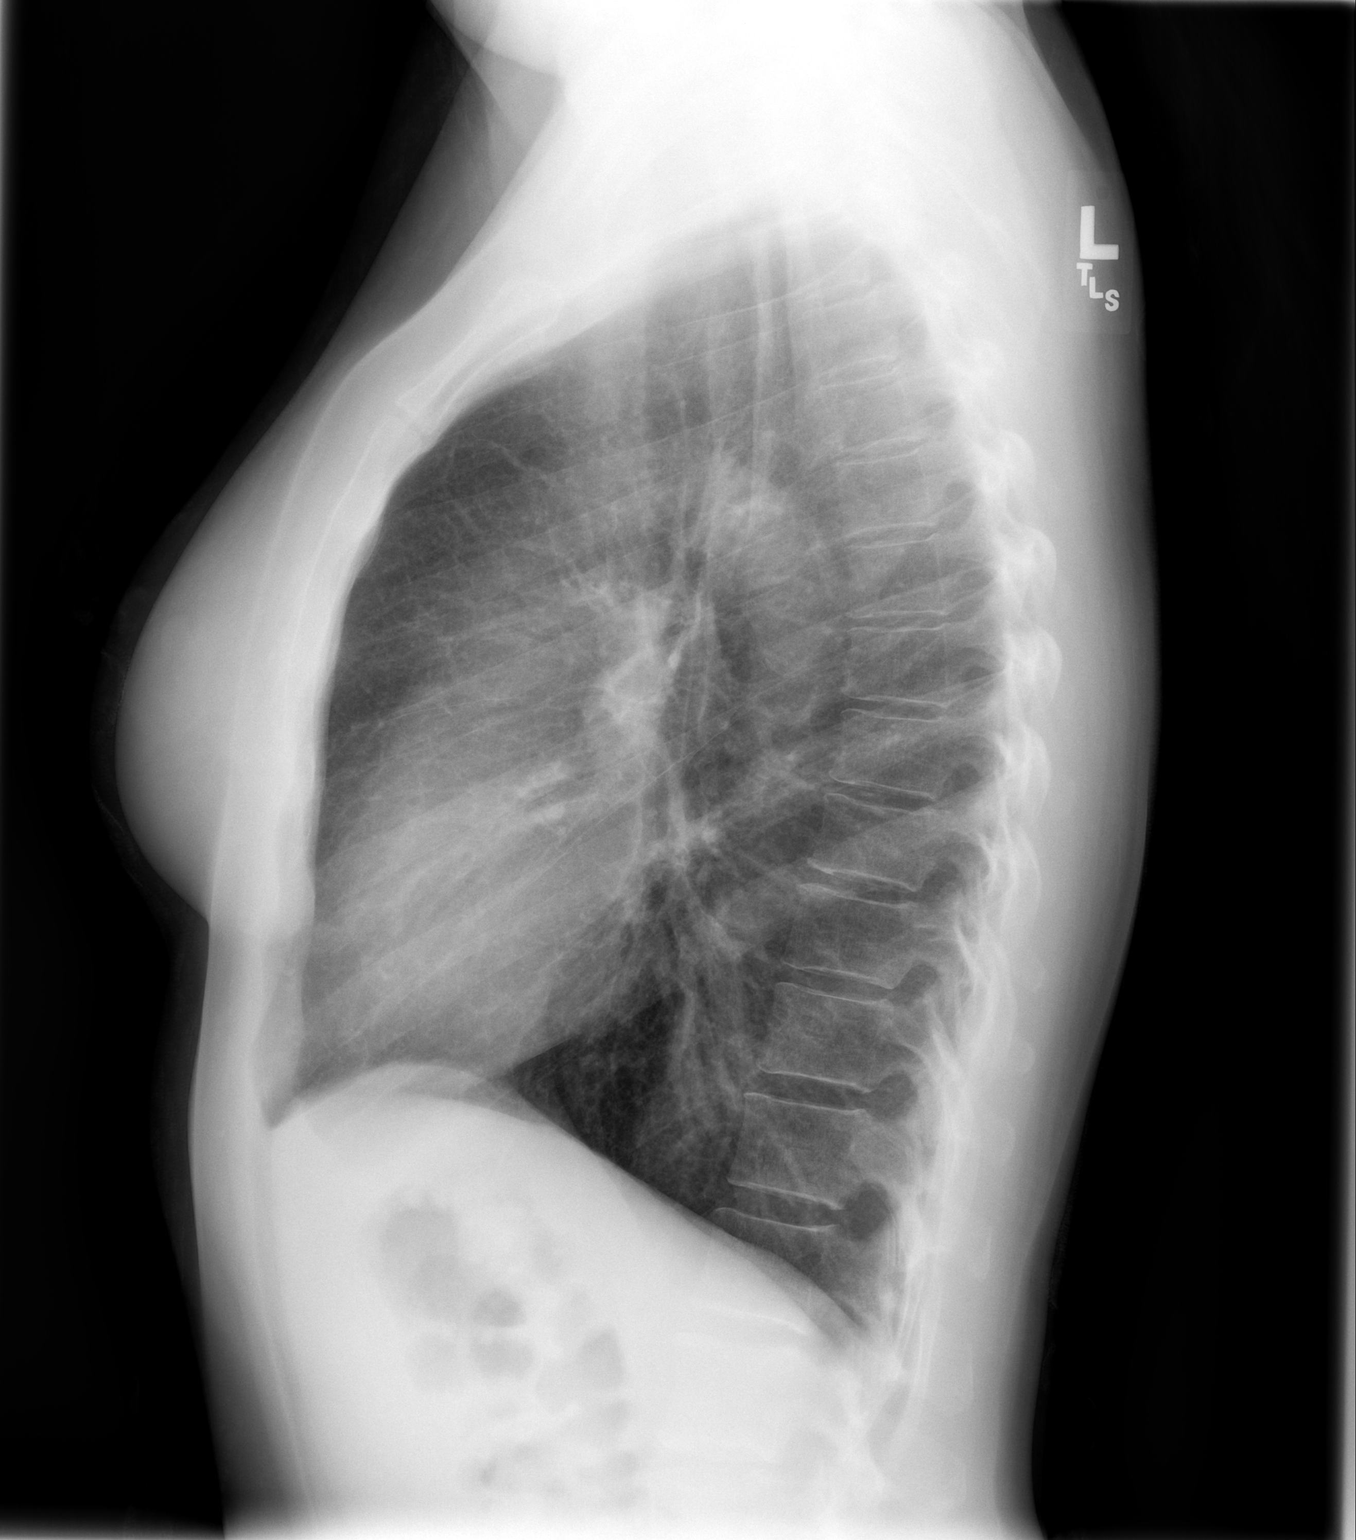

[2 of 2 positions shown; findings below may reference images not displayed]

FINDINGS: Heart size and pulmonary vascularity is normal.  The
lungs show mild hyperinflation.  Bronchial markings are prominent
suggestive of chronic bronchitis/COPD.  Small calcified granuloma
in the right lung base is unchanged.  There is no infiltrate or
mass lesion.  There is no effusion.
IMPRESSION: COPD.  No active cardiopulmonary disease.

## 2010-06-07 ENCOUNTER — Encounter: Payer: Self-pay | Admitting: Internal Medicine

## 2010-06-07 DIAGNOSIS — Z0000 Encounter for general adult medical examination without abnormal findings: Secondary | ICD-10-CM

## 2010-06-07 DIAGNOSIS — Z720 Tobacco use: Secondary | ICD-10-CM

## 2010-06-07 DIAGNOSIS — I2510 Atherosclerotic heart disease of native coronary artery without angina pectoris: Secondary | ICD-10-CM

## 2010-06-07 DIAGNOSIS — F419 Anxiety disorder, unspecified: Secondary | ICD-10-CM | POA: Insufficient documentation

## 2010-06-15 ENCOUNTER — Encounter: Payer: Self-pay | Admitting: Internal Medicine

## 2010-06-15 DIAGNOSIS — Z0000 Encounter for general adult medical examination without abnormal findings: Secondary | ICD-10-CM

## 2010-06-16 ENCOUNTER — Ambulatory Visit (INDEPENDENT_AMBULATORY_CARE_PROVIDER_SITE_OTHER): Payer: Self-pay | Admitting: Internal Medicine

## 2010-06-16 ENCOUNTER — Encounter: Payer: Self-pay | Admitting: Internal Medicine

## 2010-06-16 VITALS — BP 150/85 | HR 75 | Temp 98.7°F | Ht 63.0 in | Wt 125.8 lb

## 2010-06-16 DIAGNOSIS — Z0000 Encounter for general adult medical examination without abnormal findings: Secondary | ICD-10-CM

## 2010-06-16 DIAGNOSIS — I10 Essential (primary) hypertension: Secondary | ICD-10-CM

## 2010-06-16 DIAGNOSIS — Z720 Tobacco use: Secondary | ICD-10-CM

## 2010-06-16 DIAGNOSIS — F17200 Nicotine dependence, unspecified, uncomplicated: Secondary | ICD-10-CM

## 2010-06-16 DIAGNOSIS — J302 Other seasonal allergic rhinitis: Secondary | ICD-10-CM

## 2010-06-16 DIAGNOSIS — F329 Major depressive disorder, single episode, unspecified: Secondary | ICD-10-CM

## 2010-06-16 DIAGNOSIS — M549 Dorsalgia, unspecified: Secondary | ICD-10-CM

## 2010-06-16 DIAGNOSIS — J309 Allergic rhinitis, unspecified: Secondary | ICD-10-CM

## 2010-06-16 MED ORDER — CITALOPRAM HYDROBROMIDE 20 MG PO TABS: 20.0000 mg | ORAL_TABLET | Freq: Every day | ORAL | Status: DC

## 2010-06-16 MED ORDER — HYDROCODONE-ACETAMINOPHEN 5-500 MG PO TABS: 1.0000 | ORAL_TABLET | Freq: Three times a day (TID) | ORAL | Status: DC | PRN

## 2010-06-16 MED ORDER — LORATADINE 10 MG PO TABS: 10.0000 mg | ORAL_TABLET | Freq: Every day | ORAL | Status: DC
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
## 2010-06-16 NOTE — Assessment & Plan Note (Signed)
No evidence of infection. Symptoms are consistent with her seasonal allergies. - Start Loratadine - Rx given.

## 2010-06-16 NOTE — Assessment & Plan Note (Signed)
Patient wants to follow-up with Dr. Okey Dupre for PAP, knows she needs to follow up ASAP.

## 2010-06-16 NOTE — Assessment & Plan Note (Addendum)
Pain uncontrolled at this time on current regimen. It is feasible to escalate to TID dosing.  - Patient not to get early refills. - Patient to only get 1 month supply of medications. - Patient to follow-up with [facility] - will await recommendations. Letter was sent to [facility] to inform them that Ms. Mellott has a pain contract with [place], also, if adjustments need to be made to her medication regimen, we request notification. Additionally, if they want to resume pain medication management, we request to be notified. - Patient given Rx for Vicodin 5-500 TID, #90. - Patient understands that she can only have one prescriber of pain medications, and that we are requesting records from [facility].  - UDS today.

## 2010-06-16 NOTE — Patient Instructions (Addendum)
1) Please follow-up at the clinic in 1 month, at which time we will reevaluate your back pain, seasonal allergies, blood pressure. 2) You have been started on Vicodin three times daily. Do not drink alcohol, do not operate heavy machinery or drive while on this medication as it can cause drowsiness. 3) If you develop rash, throat closing, tongue swelling, please stop the medication and call the clinic at [phone]. 4) Please have [facility] and all specialists send a report to our clinic. 5) Please have your PAP performed, as you are currently overdue.  Please bring all of your medications(including your Vicodin) in a bag to your next visit.

## 2010-06-16 NOTE — Assessment & Plan Note (Addendum)
Patient recommended to quit, and states she will get an electronic cigarette soon, and wants to quit slowly, understanding the detrimental effects to her health. Does not want to try Chantix, has had multiple friends have adverse side effects.

## 2010-06-16 NOTE — Assessment & Plan Note (Addendum)
Well controlled. Continue current regimen. - Printed Rx of Celexa as patient prefers to get at [facility].

## 2010-06-16 NOTE — Assessment & Plan Note (Signed)
Will monitor, as has been better controlled within 01-05/2010 when seen at cardiology office, and repeat BP is lower. -  Continue current regimen.

## 2010-06-17 ENCOUNTER — Ambulatory Visit: Payer: Self-pay | Admitting: Internal Medicine

## 2010-06-17 LAB — DRUGS OF ABUSE SCREEN W/O ALC, ROUTINE URINE
Benzodiazepines.: POSITIVE — AB
Cocaine Metabolites: NEGATIVE
Creatinine,U: 98.7 mg/dL
Opiate Screen, Urine: POSITIVE — AB
Phencyclidine (PCP): NEGATIVE
Propoxyphene: NEGATIVE

## 2010-06-19 LAB — BENZODIAZEPINE, QUANTITATIVE, URINE
Alprazolam (GC/LC/MS), ur confirm: 212 NG/ML — ABNORMAL HIGH
Flurazepam Metabolite: NEGATIVE NG/ML
Lorazepam: NEGATIVE NG/ML
Nordiazepam GC/MS Conf: NEGATIVE NG/ML

## 2010-06-19 LAB — OPIATE, QUANTITATIVE, URINE
Codeine Urine: NEGATIVE NG/ML
Hydrocodone: 1308 NG/ML — ABNORMAL HIGH
Hydromorphone - Total: 1141 NG/ML — ABNORMAL HIGH
Morphine Urine: NEGATIVE NG/ML

## 2010-06-24 ENCOUNTER — Telehealth (INDEPENDENT_AMBULATORY_CARE_PROVIDER_SITE_OTHER): Payer: Self-pay | Admitting: *Deleted

## 2010-06-25 LAB — URINE CULTURE
Colony Count: NO GROWTH
Colony Count: 5000
Colony Count: >=100000
Culture  Setup Time: 201110260223
Culture  Setup Time: 201110050425
Culture: NO GROWTH

## 2010-06-25 LAB — BASIC METABOLIC PANEL
BUN: 13 mg/dL (ref 6–23)
Calcium: 9.1 mg/dL (ref 8.4–10.5)
Calcium: 9.6 mg/dL (ref 8.4–10.5)
Chloride: 108 mEq/L (ref 96–112)
GFR calc Af Amer: >60 mL/min (ref >60)
GFR calc Af Amer: >60 mL/min (ref >60)
GFR calc non Af Amer: >60 mL/min (ref >60)
GFR calc non Af Amer: >60 mL/min (ref >60)
GFR calc non Af Amer: >60 mL/min (ref >60)
Glucose, Bld: 100 mg/dL — ABNORMAL HIGH (ref 70–99)
Glucose, Bld: 133 mg/dL — ABNORMAL HIGH (ref 70–99)
Potassium: 3.8 mEq/L (ref 3.5–5.1)
Potassium: 3.9 mEq/L (ref 3.5–5.1)
Potassium: 4.2 mEq/L (ref 3.5–5.1)
Sodium: 136 mEq/L (ref 135–145)
Sodium: 139 mEq/L (ref 135–145)
Sodium: 139 mEq/L (ref 135–145)

## 2010-06-25 LAB — DIFFERENTIAL
Basophils Absolute: 0 10*3/uL (ref 0.0–0.1)
Basophils Absolute: 0 10*3/uL (ref 0.0–0.1)
Basophils Relative: 0 % (ref 0–1)
Basophils Relative: 0 % (ref 0–1)
Eosinophils Absolute: 0.2 10*3/uL (ref 0.0–0.7)
Eosinophils Absolute: 0.3 10*3/uL (ref 0.0–0.7)
Eosinophils Relative: 4 % (ref 0–5)
Eosinophils Relative: 5 % (ref 0–5)
Eosinophils Relative: 0 % (ref 0–5)
Lymphocytes Relative: 53 % — ABNORMAL HIGH (ref 12–46)
Lymphs Abs: 3.1 10*3/uL (ref 0.7–4.0)
Lymphs Abs: 4.1 10*3/uL — ABNORMAL HIGH (ref 0.7–4.0)
Monocytes Absolute: 0.6 10*3/uL (ref 0.1–1.0)
Monocytes Absolute: 1.6 10*3/uL — ABNORMAL HIGH (ref 0.1–1.0)
Monocytes Relative: 10 % (ref 3–12)
Neutro Abs: 2.1 10*3/uL (ref 1.7–7.7)
Neutro Abs: 12.1 10*3/uL — ABNORMAL HIGH (ref 1.7–7.7)
Neutrophils Relative %: 63 % (ref 43–77)

## 2010-06-25 LAB — MAGNESIUM: Magnesium: 2 mg/dL (ref 1.5–2.5)

## 2010-06-25 LAB — CBC
HCT: 34.3 % — ABNORMAL LOW (ref 36.0–46.0)
HCT: 36.7 % (ref 36.0–46.0)
Hemoglobin: 11.5 g/dL — ABNORMAL LOW (ref 12.0–15.0)
MCH: 32.3 pg (ref 26.0–34.0)
MCH: 33 pg (ref 26.0–34.0)
MCHC: 33.5 g/dL (ref 30.0–36.0)
MCHC: 33.6 g/dL (ref 30.0–36.0)
MCHC: 35.4 g/dL (ref 30.0–36.0)
MCV: 97.4 fL (ref 78.0–100.0)
MCV: 98 fL (ref 78.0–100.0)
MCV: 94.3 fL (ref 78.0–100.0)
Platelets: 270 10*3/uL (ref 150–400)
Platelets: 460 10*3/uL — ABNORMAL HIGH (ref 150–400)
Platelets: 217 10*3/uL (ref 150–400)
RBC: 3.78 MIL/uL — ABNORMAL LOW (ref 3.87–5.11)
RBC: 3.79 MIL/uL — ABNORMAL LOW (ref 3.87–5.11)
RDW: 14.2 % (ref 11.5–15.5)
RDW: 14.2 % (ref 11.5–15.5)
RDW: 12.7 % (ref 11.5–15.5)
WBC: 6.5 10*3/uL (ref 4.0–10.5)
WBC: 15.1 10*3/uL — ABNORMAL HIGH (ref 4.0–10.5)

## 2010-06-25 LAB — URINALYSIS, ROUTINE W REFLEX MICROSCOPIC
Bilirubin Urine: NEGATIVE
Bilirubin Urine: NEGATIVE
Glucose, UA: NEGATIVE mg/dL
Glucose, UA: NEGATIVE mg/dL
Hgb urine dipstick: NEGATIVE
Ketones, ur: NEGATIVE mg/dL
Ketones, ur: NEGATIVE mg/dL
Leukocytes, UA: NEGATIVE
Leukocytes, UA: NEGATIVE
Nitrite: NEGATIVE
Nitrite: POSITIVE — AB
Protein, ur: NEGATIVE mg/dL
Protein, ur: NEGATIVE mg/dL
Protein, ur: NEGATIVE mg/dL
Specific Gravity, Urine: 1.028 (ref 1.005–1.030)
Urobilinogen, UA: 0.2 mg/dL (ref 0.0–1.0)
Urobilinogen, UA: 1 mg/dL (ref 0.0–1.0)
Urobilinogen, UA: 1 mg/dL (ref 0.0–1.0)
pH: 6 (ref 5.0–8.0)

## 2010-06-25 LAB — POCT CARDIAC MARKERS
Myoglobin, poc: 29 ng/mL (ref 12–200)
Myoglobin, poc: 30.7 ng/mL (ref 12–200)

## 2010-06-25 LAB — COMPREHENSIVE METABOLIC PANEL
ALT: 12 U/L (ref 0–35)
AST: 19 U/L (ref 0–37)
Calcium: 9.2 mg/dL (ref 8.4–10.5)
Creatinine, Ser: 0.62 mg/dL (ref 0.4–1.2)
GFR calc Af Amer: >60 mL/min (ref >60)
Sodium: 138 mEq/L (ref 135–145)
Total Protein: 7.1 g/dL (ref 6.0–8.3)

## 2010-06-25 LAB — POCT I-STAT, CHEM 8
HCT: 42 % (ref 36.0–46.0)
Hemoglobin: 14.3 g/dL (ref 12.0–15.0)
Sodium: 135 mEq/L (ref 135–145)
TCO2: 26 mmol/L (ref 0–100)

## 2010-06-25 LAB — RAPID URINE DRUG SCREEN, HOSP PERFORMED
Amphetamines: NOT DETECTED
Amphetamines: NOT DETECTED
Barbiturates: NOT DETECTED
Benzodiazepines: POSITIVE — AB

## 2010-06-25 LAB — POCT PREGNANCY, URINE: Preg Test, Ur: NEGATIVE

## 2010-06-25 LAB — LIPASE, BLOOD: Lipase: 23 U/L (ref 11–59)

## 2010-06-25 LAB — URINE MICROSCOPIC-ADD ON

## 2010-06-25 LAB — CARDIAC PANEL(CRET KIN+CKTOT+MB+TROPI)
CK, MB: 1 ng/mL (ref 0.3–4.0)
CK, MB: 1.3 ng/mL (ref 0.3–4.0)
Relative Index: INVALID (ref 0.0–2.5)
Total CK: 53 U/L (ref 7–177)
Total CK: 62 U/L (ref 7–177)
Troponin I: <0.01 ng/mL (ref 0.00–0.06)

## 2010-06-25 LAB — HEPATIC FUNCTION PANEL
AST: 64 U/L — ABNORMAL HIGH (ref 0–37)
Bilirubin, Direct: 0.1 mg/dL (ref 0.0–0.3)
Indirect Bilirubin: 0.4 mg/dL (ref 0.3–0.9)

## 2010-06-26 LAB — POCT CARDIAC MARKERS
Myoglobin, poc: 38.4 ng/mL (ref 12–200)
Myoglobin, poc: 42.3 ng/mL (ref 12–200)
Troponin i, poc: <0.05 ng/mL (ref 0.00–0.09)

## 2010-06-29 LAB — CBC
HCT: 43.8 % (ref 36.0–46.0)
HCT: 40 % (ref 36.0–46.0)
Hemoglobin: 15.1 g/dL — ABNORMAL HIGH (ref 12.0–15.0)
Hemoglobin: 13.7 g/dL (ref 12.0–15.0)
MCHC: 34.5 g/dL (ref 30.0–36.0)
MCHC: 34.3 g/dL (ref 30.0–36.0)
MCV: 101.6 fL — ABNORMAL HIGH (ref 78.0–100.0)
MCV: 101.1 fL — ABNORMAL HIGH (ref 78.0–100.0)
Platelets: 343 10*3/uL (ref 150–400)
Platelets: 194 10*3/uL (ref 150–400)
RBC: 4.31 MIL/uL (ref 3.87–5.11)
RBC: 3.96 MIL/uL (ref 3.87–5.11)
RDW: 14.8 % (ref 11.5–15.5)
RDW: 13.3 % (ref 11.5–15.5)
WBC: 10.9 10*3/uL — ABNORMAL HIGH (ref 4.0–10.5)
WBC: 8.7 10*3/uL (ref 4.0–10.5)

## 2010-06-29 LAB — POCT CARDIAC MARKERS
CKMB, poc: <1 ng/mL — ABNORMAL LOW (ref 1.0–8.0)
Myoglobin, poc: 31.3 ng/mL (ref 12–200)
Myoglobin, poc: 45.3 ng/mL (ref 12–200)
Troponin i, poc: <0.05 ng/mL (ref 0.00–0.09)

## 2010-06-29 LAB — URINALYSIS, ROUTINE W REFLEX MICROSCOPIC
Glucose, UA: NEGATIVE mg/dL
Glucose, UA: NEGATIVE mg/dL
Ketones, ur: NEGATIVE mg/dL
Ketones, ur: NEGATIVE mg/dL
Leukocytes, UA: NEGATIVE
Leukocytes, UA: NEGATIVE
Nitrite: NEGATIVE
Protein, ur: NEGATIVE mg/dL
Protein, ur: NEGATIVE mg/dL
pH: 5 (ref 5.0–8.0)
pH: 5.5 (ref 5.0–8.0)

## 2010-06-29 LAB — RAPID URINE DRUG SCREEN, HOSP PERFORMED
Amphetamines: NOT DETECTED
Barbiturates: NOT DETECTED
Barbiturates: NOT DETECTED
Benzodiazepines: POSITIVE — AB
Tetrahydrocannabinol: NOT DETECTED

## 2010-06-29 LAB — URINE MICROSCOPIC-ADD ON

## 2010-06-29 LAB — LIPASE, BLOOD: Lipase: 165 U/L — ABNORMAL HIGH (ref 11–59)

## 2010-06-29 LAB — BASIC METABOLIC PANEL WITH GFR
BUN: 16 mg/dL (ref 6–23)
CO2: 23 meq/L (ref 19–32)
Calcium: 9.4 mg/dL (ref 8.4–10.5)
Chloride: 110 meq/L (ref 96–112)
Creatinine, Ser: 0.61 mg/dL (ref 0.4–1.2)
GFR calc non Af Amer: >60 mL/min
Glucose, Bld: 98 mg/dL (ref 70–99)
Potassium: 4.6 meq/L (ref 3.5–5.1)
Sodium: 138 meq/L (ref 135–145)

## 2010-06-29 LAB — COMPREHENSIVE METABOLIC PANEL WITH GFR
ALT: 13 U/L (ref 0–35)
AST: 32 U/L (ref 0–37)
Albumin: 4.6 g/dL (ref 3.5–5.2)
Alkaline Phosphatase: 77 U/L (ref 39–117)
BUN: 8 mg/dL (ref 6–23)
CO2: 28 meq/L (ref 19–32)
Calcium: 9 mg/dL (ref 8.4–10.5)
Chloride: 109 meq/L (ref 96–112)
Creatinine, Ser: 0.63 mg/dL (ref 0.4–1.2)
GFR calc non Af Amer: >60 mL/min
Glucose, Bld: 112 mg/dL — ABNORMAL HIGH (ref 70–99)
Potassium: 4.3 meq/L (ref 3.5–5.1)
Sodium: 145 meq/L (ref 135–145)
Total Bilirubin: 0.7 mg/dL (ref 0.3–1.2)
Total Protein: 8.8 g/dL — ABNORMAL HIGH (ref 6.0–8.3)

## 2010-06-29 LAB — URINE CULTURE: Colony Count: 2000

## 2010-06-29 LAB — D-DIMER, QUANTITATIVE: D-Dimer, Quant: <0.22 ug/mL-FEU (ref 0.00–0.48)

## 2010-06-29 LAB — DIFFERENTIAL
Basophils Relative: 0 % (ref 0–1)
Lymphocytes Relative: 39 % (ref 12–46)
Lymphs Abs: 3.4 10*3/uL (ref 0.7–4.0)
Monocytes Absolute: 0.6 10*3/uL (ref 0.1–1.0)
Monocytes Relative: 6 % (ref 3–12)
Neutro Abs: 4.4 10*3/uL (ref 1.7–7.7)

## 2010-06-29 LAB — LACTIC ACID, PLASMA: Lactic Acid, Venous: 2.2 mmol/L (ref 0.5–2.2)

## 2010-06-29 LAB — ETHANOL

## 2010-06-29 LAB — POCT PREGNANCY, URINE
Preg Test, Ur: NEGATIVE
Preg Test, Ur: NEGATIVE

## 2010-06-30 ENCOUNTER — Encounter: Payer: Self-pay | Admitting: *Deleted

## 2010-06-30 LAB — BASIC METABOLIC PANEL
Calcium: 9.8 mg/dL (ref 8.4–10.5)
GFR calc non Af Amer: >60 mL/min (ref >60)
Glucose, Bld: 77 mg/dL (ref 70–99)
Sodium: 142 mEq/L (ref 135–145)

## 2010-06-30 LAB — COMPREHENSIVE METABOLIC PANEL
ALT: 13 U/L (ref 0–35)
AST: 23 U/L (ref 0–37)
Albumin: 3.8 g/dL (ref 3.5–5.2)
Alkaline Phosphatase: 85 U/L (ref 39–117)
BUN: 7 mg/dL (ref 6–23)
CO2: 24 mEq/L (ref 19–32)
Calcium: 9.1 mg/dL (ref 8.4–10.5)
Calcium: 8.7 mg/dL (ref 8.4–10.5)
Chloride: 113 mEq/L — ABNORMAL HIGH (ref 96–112)
Creatinine, Ser: 0.67 mg/dL (ref 0.4–1.2)
GFR calc Af Amer: >60 mL/min (ref >60)
GFR calc non Af Amer: >60 mL/min (ref >60)
Glucose, Bld: 159 mg/dL — ABNORMAL HIGH (ref 70–99)
Potassium: 3.3 mEq/L — ABNORMAL LOW (ref 3.5–5.1)
Sodium: 137 mEq/L (ref 135–145)
Total Bilirubin: 0.3 mg/dL (ref 0.3–1.2)
Total Protein: 7.5 g/dL (ref 6.0–8.3)

## 2010-06-30 LAB — DIFFERENTIAL
Basophils Absolute: 0.1 10*3/uL (ref 0.0–0.1)
Lymphocytes Relative: 29 % (ref 12–46)
Monocytes Absolute: 0.3 10*3/uL (ref 0.1–1.0)
Neutro Abs: 6.3 10*3/uL (ref 1.7–7.7)

## 2010-06-30 LAB — LIPID PANEL
HDL: 52 mg/dL (ref >39)
LDL Cholesterol: 76 mg/dL (ref 0–99)
Total CHOL/HDL Ratio: 2.9 RATIO
VLDL: 24 mg/dL (ref 0–40)

## 2010-06-30 LAB — CARDIAC PANEL(CRET KIN+CKTOT+MB+TROPI)
CK, MB: 1.3 ng/mL (ref 0.3–4.0)
Relative Index: INVALID (ref 0.0–2.5)
Total CK: 43 U/L (ref 7–177)
Total CK: 55 U/L (ref 7–177)
Troponin I: <0.01 ng/mL (ref 0.00–0.06)

## 2010-06-30 LAB — CBC
HCT: 34.9 % — ABNORMAL LOW (ref 36.0–46.0)
Hemoglobin: 15.3 g/dL — ABNORMAL HIGH (ref 12.0–15.0)
MCHC: 34.7 g/dL (ref 30.0–36.0)
MCV: 102 fL — ABNORMAL HIGH (ref 78.0–100.0)
Platelets: 243 10*3/uL (ref 150–400)
Platelets: 273 10*3/uL (ref 150–400)
RBC: 3.42 MIL/uL — ABNORMAL LOW (ref 3.87–5.11)
RDW: 13.5 % (ref 11.5–15.5)
RDW: 13.7 % (ref 11.5–15.5)

## 2010-06-30 LAB — RAPID URINE DRUG SCREEN, HOSP PERFORMED
Amphetamines: NOT DETECTED
Opiates: POSITIVE — AB
Tetrahydrocannabinol: NOT DETECTED

## 2010-06-30 LAB — POCT CARDIAC MARKERS
CKMB, poc: 1.1 ng/mL (ref 1.0–8.0)
CKMB, poc: 1 ng/mL (ref 1.0–8.0)
Myoglobin, poc: 47.1 ng/mL (ref 12–200)
Myoglobin, poc: 36.9 ng/mL (ref 12–200)
Troponin i, poc: <0.05 ng/mL (ref 0.00–0.09)

## 2010-06-30 LAB — TSH: TSH: 1.746 u[IU]/mL (ref 0.350–4.500)

## 2010-06-30 LAB — LIPASE, BLOOD: Lipase: 32 U/L (ref 11–59)

## 2010-07-01 NOTE — Progress Notes (Signed)
  Phone Note Other Incoming   Request: Send information Summary of Call: Request for records received from [place]. Request forwarded to [employer].  09-2006.

## 2010-07-02 LAB — COMPREHENSIVE METABOLIC PANEL
ALT: 17 U/L (ref 0–35)
AST: 25 U/L (ref 0–37)
Alkaline Phosphatase: 67 U/L (ref 39–117)
CO2: 25 mEq/L (ref 19–32)
Chloride: 107 mEq/L (ref 96–112)
GFR calc Af Amer: >60 mL/min (ref >60)
GFR calc non Af Amer: >60 mL/min (ref >60)
Potassium: 3.9 mEq/L (ref 3.5–5.1)
Sodium: 141 mEq/L (ref 135–145)
Total Bilirubin: 0.5 mg/dL (ref 0.3–1.2)

## 2010-07-02 LAB — CBC
RBC: 3.72 MIL/uL — ABNORMAL LOW (ref 3.87–5.11)
WBC: 7.5 10*3/uL (ref 4.0–10.5)

## 2010-07-02 LAB — DIFFERENTIAL
Basophils Absolute: 0 10*3/uL (ref 0.0–0.1)
Eosinophils Absolute: 0.1 10*3/uL (ref 0.0–0.7)
Eosinophils Relative: 1 % (ref 0–5)
Lymphs Abs: 2.6 10*3/uL (ref 0.7–4.0)

## 2010-07-02 LAB — URINALYSIS, ROUTINE W REFLEX MICROSCOPIC
Bilirubin Urine: NEGATIVE
Ketones, ur: NEGATIVE mg/dL
Nitrite: NEGATIVE
pH: 6 (ref 5.0–8.0)

## 2010-07-02 LAB — LIPASE, BLOOD: Lipase: 57 U/L (ref 11–59)

## 2010-07-02 LAB — URINE MICROSCOPIC-ADD ON

## 2010-07-04 ENCOUNTER — Other Ambulatory Visit: Payer: Self-pay | Admitting: Internal Medicine

## 2010-07-06 LAB — CBC
HCT: 34.1 % — ABNORMAL LOW (ref 36.0–46.0)
Hemoglobin: 11.4 g/dL — ABNORMAL LOW (ref 12.0–15.0)
Hemoglobin: 11.5 g/dL — ABNORMAL LOW (ref 12.0–15.0)
MCHC: 33.8 g/dL (ref 30.0–36.0)
MCHC: 34.4 g/dL (ref 30.0–36.0)
MCHC: 34.6 g/dL (ref 30.0–36.0)
MCHC: 34.6 g/dL (ref 30.0–36.0)
MCV: 103.4 fL — ABNORMAL HIGH (ref 78.0–100.0)
MCV: 103.6 fL — ABNORMAL HIGH (ref 78.0–100.0)
MCV: 103.6 fL — ABNORMAL HIGH (ref 78.0–100.0)
MCV: 103.8 fL — ABNORMAL HIGH (ref 78.0–100.0)
Platelets: 183 10*3/uL (ref 150–400)
Platelets: 195 10*3/uL (ref 150–400)
RBC: 3.1 MIL/uL — ABNORMAL LOW (ref 3.87–5.11)
RBC: 3.19 MIL/uL — ABNORMAL LOW (ref 3.87–5.11)
RBC: 3.61 MIL/uL — ABNORMAL LOW (ref 3.87–5.11)
RDW: 14.5 % (ref 11.5–15.5)
RDW: 14.6 % (ref 11.5–15.5)
RDW: 14.8 % (ref 11.5–15.5)
WBC: 9.3 10*3/uL (ref 4.0–10.5)

## 2010-07-06 LAB — DIFFERENTIAL
Basophils Absolute: 0 10*3/uL (ref 0.0–0.1)
Basophils Relative: 0 % (ref 0–1)
Lymphocytes Relative: 26 % (ref 12–46)
Monocytes Absolute: 0.4 10*3/uL (ref 0.1–1.0)
Neutro Abs: 6.2 10*3/uL (ref 1.7–7.7)
Neutrophils Relative %: 67 % (ref 43–77)

## 2010-07-06 LAB — URINALYSIS, MICROSCOPIC ONLY
Hgb urine dipstick: NEGATIVE
Nitrite: NEGATIVE
Protein, ur: NEGATIVE mg/dL
Urobilinogen, UA: 0.2 mg/dL (ref 0.0–1.0)

## 2010-07-06 LAB — PROTIME-INR
INR: 0.97 (ref 0.00–1.49)
Prothrombin Time: 12.2 seconds (ref 11.6–15.2)
Prothrombin Time: 12.8 seconds (ref 11.6–15.2)

## 2010-07-06 LAB — LIPID PANEL
Cholesterol: 115 mg/dL (ref 0–200)
Triglycerides: 106 mg/dL (ref <150)
VLDL: 21 mg/dL (ref 0–40)

## 2010-07-06 LAB — CARDIAC PANEL(CRET KIN+CKTOT+MB+TROPI)
CK, MB: 3.2 ng/mL (ref 0.3–4.0)
Relative Index: INVALID (ref 0.0–2.5)
Relative Index: INVALID (ref 0.0–2.5)
Relative Index: INVALID (ref 0.0–2.5)
Total CK: 63 U/L (ref 7–177)
Troponin I: 0.18 ng/mL — ABNORMAL HIGH (ref 0.00–0.06)

## 2010-07-06 LAB — BASIC METABOLIC PANEL
BUN: 16 mg/dL (ref 6–23)
CO2: 23 mEq/L (ref 19–32)
CO2: 24 mEq/L (ref 19–32)
CO2: 27 mEq/L (ref 19–32)
Calcium: 8.5 mg/dL (ref 8.4–10.5)
Calcium: 8.9 mg/dL (ref 8.4–10.5)
Calcium: 9 mg/dL (ref 8.4–10.5)
Chloride: 107 mEq/L (ref 96–112)
Creatinine, Ser: 0.73 mg/dL (ref 0.4–1.2)
Creatinine, Ser: 0.74 mg/dL (ref 0.4–1.2)
Creatinine, Ser: 0.76 mg/dL (ref 0.4–1.2)
GFR calc Af Amer: >60 mL/min (ref >60)
GFR calc Af Amer: >60 mL/min (ref >60)
GFR calc Af Amer: >60 mL/min (ref >60)
GFR calc non Af Amer: >60 mL/min (ref >60)
Glucose, Bld: 89 mg/dL (ref 70–99)
Potassium: 3.6 mEq/L (ref 3.5–5.1)
Potassium: 3.9 mEq/L (ref 3.5–5.1)
Sodium: 136 mEq/L (ref 135–145)
Sodium: 138 mEq/L (ref 135–145)
Sodium: 142 mEq/L (ref 135–145)

## 2010-07-06 LAB — HEPARIN LEVEL (UNFRACTIONATED)
Heparin Unfractionated: 0.11 IU/mL — ABNORMAL LOW (ref 0.30–0.70)
Heparin Unfractionated: 0.27 IU/mL — ABNORMAL LOW (ref 0.30–0.70)
Heparin Unfractionated: 0.32 IU/mL (ref 0.30–0.70)

## 2010-07-06 LAB — HIV ANTIBODY (ROUTINE TESTING W REFLEX): HIV: NONREACTIVE

## 2010-07-06 LAB — D-DIMER, QUANTITATIVE: D-Dimer, Quant: <0.22 ug/mL-FEU (ref 0.00–0.48)

## 2010-07-06 LAB — COMPREHENSIVE METABOLIC PANEL
Alkaline Phosphatase: 74 U/L (ref 39–117)
CO2: 25 mEq/L (ref 19–32)
Creatinine, Ser: 0.62 mg/dL (ref 0.4–1.2)
GFR calc Af Amer: >60 mL/min (ref >60)
GFR calc non Af Amer: >60 mL/min (ref >60)
Sodium: 137 mEq/L (ref 135–145)
Total Bilirubin: 0.4 mg/dL (ref 0.3–1.2)

## 2010-07-06 LAB — CK TOTAL AND CKMB (NOT AT ARMC)
CK, MB: 1.7 ng/mL (ref 0.3–4.0)
Relative Index: INVALID (ref 0.0–2.5)
Total CK: 74 U/L (ref 7–177)

## 2010-07-06 LAB — FOLATE RBC: RBC Folate: 644 ng/mL — ABNORMAL HIGH (ref 180–600)

## 2010-07-06 LAB — TSH: TSH: 0.579 u[IU]/mL (ref 0.350–4.500)

## 2010-07-06 LAB — RAPID URINE DRUG SCREEN, HOSP PERFORMED
Amphetamines: NOT DETECTED
Barbiturates: NOT DETECTED
Benzodiazepines: POSITIVE — AB
Cocaine: POSITIVE — AB

## 2010-07-06 LAB — VITAMIN B12 BINDING CAPACITY, BLOOD: Vitamin B12 Bind Capacity: 1387 pg/mL — ABNORMAL HIGH (ref 650–1340)

## 2010-07-06 LAB — APTT: aPTT: 128 seconds — ABNORMAL HIGH (ref 24–37)

## 2010-07-06 LAB — TROPONIN I: Troponin I: 0.02 ng/mL (ref 0.00–0.06)

## 2010-07-06 LAB — PREGNANCY, URINE: Preg Test, Ur: NEGATIVE

## 2010-07-06 LAB — HEMOGLOBIN A1C
Hgb A1c MFr Bld: 5.8 % (ref 4.6–6.1)
Mean Plasma Glucose: 120 mg/dL

## 2010-07-06 LAB — VITAMIN B12: Vitamin B-12: 513 pg/mL (ref 211–911)

## 2010-07-07 NOTE — Telephone Encounter (Signed)
Called to pharm 

## 2010-07-07 NOTE — Telephone Encounter (Signed)
Med was removed by cards 2/12 but I think it was an error as there was no discussion in the note. Called pharmacy - last picked up 05/26/10 and Rx'd Dr Phillips Odor. Will refill.

## 2010-07-10 ENCOUNTER — Emergency Department (HOSPITAL_COMMUNITY)
Admission: EM | Admit: 2010-07-10 | Discharge: 2010-07-10 | Disposition: A | Discharge status: Home / Self Care | Payer: Self-pay | Attending: Emergency Medicine | Admitting: Emergency Medicine

## 2010-07-10 ENCOUNTER — Emergency Department (HOSPITAL_COMMUNITY): Payer: Self-pay

## 2010-07-10 DIAGNOSIS — S20219A Contusion of unspecified front wall of thorax, initial encounter: Secondary | ICD-10-CM | POA: Insufficient documentation

## 2010-07-10 DIAGNOSIS — Z79899 Other long term (current) drug therapy: Secondary | ICD-10-CM | POA: Insufficient documentation

## 2010-07-10 DIAGNOSIS — R1012 Left upper quadrant pain: Secondary | ICD-10-CM | POA: Insufficient documentation

## 2010-07-10 DIAGNOSIS — I252 Old myocardial infarction: Secondary | ICD-10-CM | POA: Insufficient documentation

## 2010-07-10 DIAGNOSIS — Y929 Unspecified place or not applicable: Secondary | ICD-10-CM | POA: Insufficient documentation

## 2010-07-10 DIAGNOSIS — IMO0002 Reserved for concepts with insufficient information to code with codable children: Secondary | ICD-10-CM | POA: Insufficient documentation

## 2010-07-10 DIAGNOSIS — S5010XA Contusion of unspecified forearm, initial encounter: Secondary | ICD-10-CM | POA: Insufficient documentation

## 2010-07-10 DIAGNOSIS — R079 Chest pain, unspecified: Secondary | ICD-10-CM | POA: Insufficient documentation

## 2010-07-10 DIAGNOSIS — Z7982 Long term (current) use of aspirin: Secondary | ICD-10-CM | POA: Insufficient documentation

## 2010-07-10 DIAGNOSIS — F341 Dysthymic disorder: Secondary | ICD-10-CM | POA: Insufficient documentation

## 2010-07-10 DIAGNOSIS — I10 Essential (primary) hypertension: Secondary | ICD-10-CM | POA: Insufficient documentation

## 2010-07-14 ENCOUNTER — Other Ambulatory Visit: Payer: Self-pay | Admitting: Internal Medicine

## 2010-07-15 ENCOUNTER — Telehealth: Payer: Self-pay | Admitting: *Deleted

## 2010-07-15 LAB — HEPATIC FUNCTION PANEL
Albumin: 3.8 g/dL (ref 3.5–5.2)
Total Protein: 7.3 g/dL (ref 6.0–8.3)

## 2010-07-15 LAB — POCT I-STAT, CHEM 8
BUN: 14 mg/dL (ref 6–23)
Chloride: 108 mEq/L (ref 96–112)
Creatinine, Ser: 0.7 mg/dL (ref 0.4–1.2)
Hemoglobin: 12.9 g/dL (ref 12.0–15.0)
Potassium: 3.8 mEq/L (ref 3.5–5.1)
Sodium: 140 mEq/L (ref 135–145)

## 2010-07-15 LAB — CBC
MCHC: 35.1 g/dL (ref 30.0–36.0)
MCV: 101 fL — ABNORMAL HIGH (ref 78.0–100.0)
Platelets: 208 10*3/uL (ref 150–400)

## 2010-07-15 LAB — DIFFERENTIAL
Basophils Absolute: 0.1 10*3/uL (ref 0.0–0.1)
Basophils Relative: 1 % (ref 0–1)
Eosinophils Absolute: 0.3 10*3/uL (ref 0.0–0.7)
Neutro Abs: 4.4 10*3/uL (ref 1.7–7.7)
Neutrophils Relative %: 54 % (ref 43–77)

## 2010-07-15 NOTE — Telephone Encounter (Signed)
Pt called wanted to know why her pain med was refused, informed her that she must be seen, she states she will wait until 4/12 when she has an appt w/ dr Dorothyann Gibbs.

## 2010-07-15 NOTE — Telephone Encounter (Signed)
Sounds good

## 2010-07-18 IMAGING — CT CT ABDOMEN W/O CM
2 of 4 series · 17 of 46 positions shown, 19 images · non-contrast
Comparison: 10/08/2003

CT ABDOMEN

CLINICAL DATA: Right-sided flank pain.  Renal colic.  Hematuria.

CT ABDOMEN AND PELVIS WITHOUT CONTRAST
TECHNIQUE: Multidetector CT imaging of the abdomen and pelvis was
performed following the standard protocol without intravenous
contrast.

[Series 2: renal stone 5.0 b31f st · axial · 0.69mm/px · z∈[-518,-108]mm · 14 of 90 slices shown, 16 images]
[im 4/90  soft-tissue]
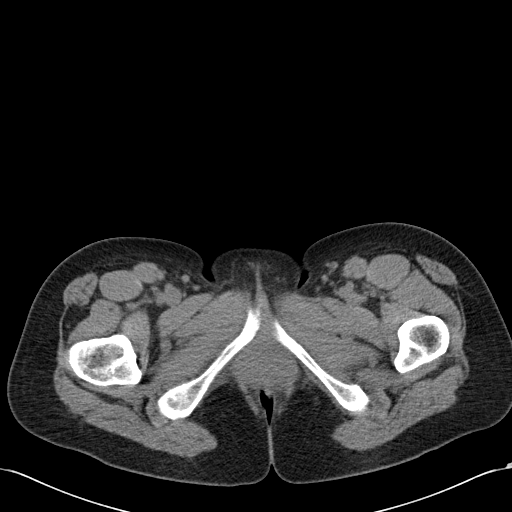
[im 4/90  bone]
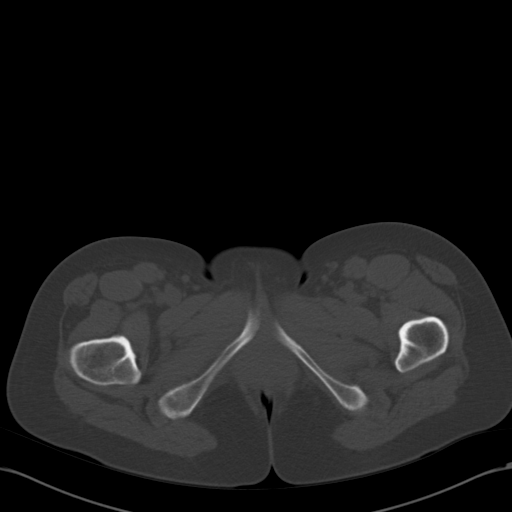
[im 12/90  soft-tissue]
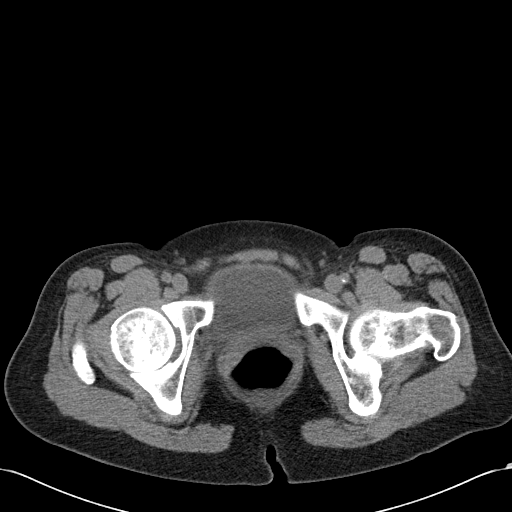
[im 16/90  soft-tissue]
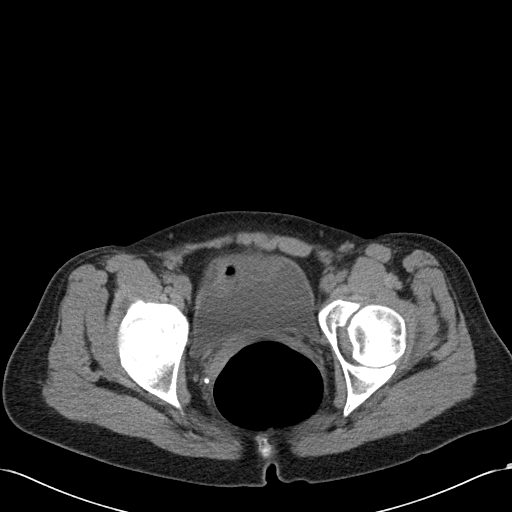
[im 24/90  soft-tissue]
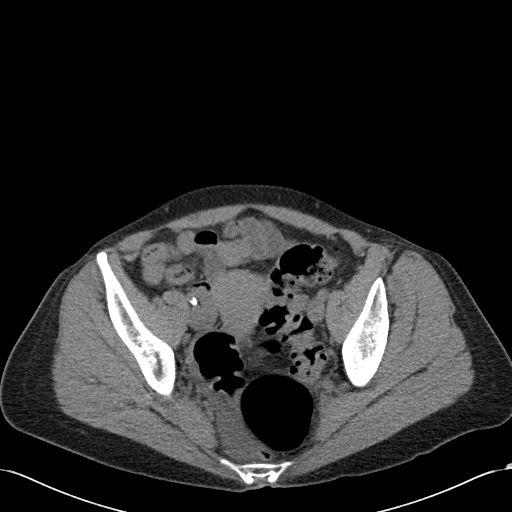
[im 31/90  soft-tissue]
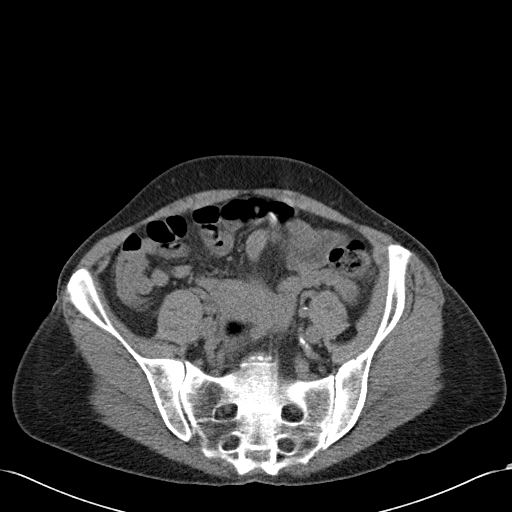
[im 35/90  soft-tissue]
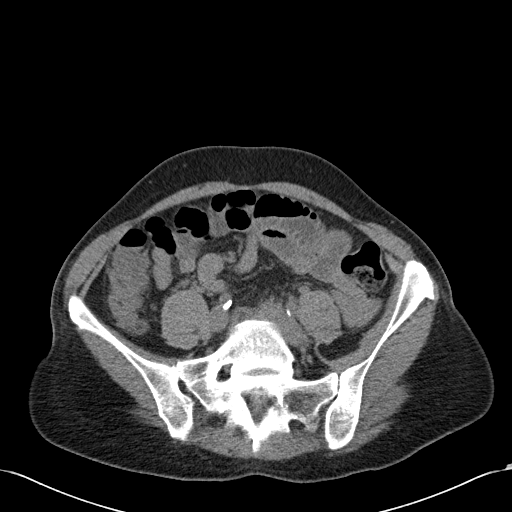
[im 43/90  soft-tissue]
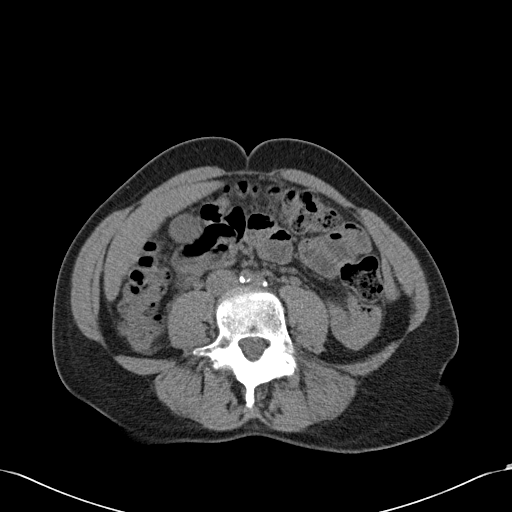
[im 47/90  soft-tissue]
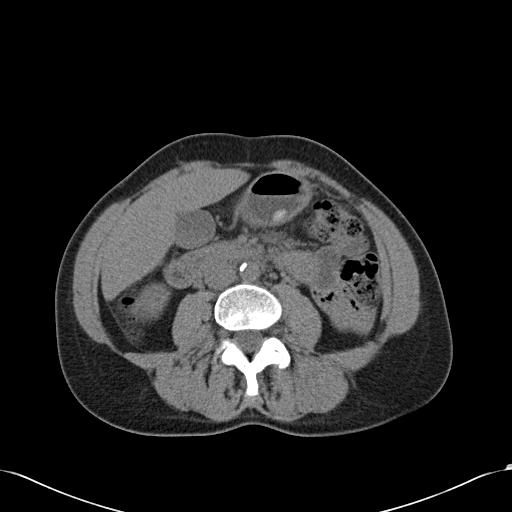
[im 55/90  soft-tissue]
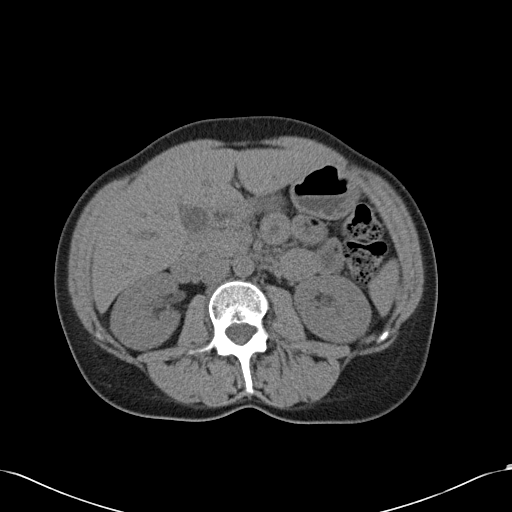
[im 55/90  bone]
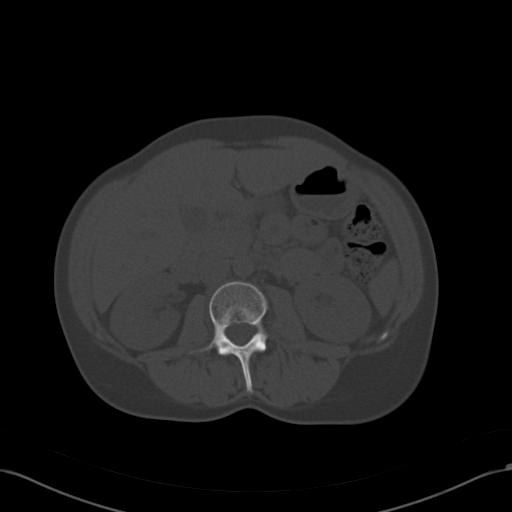
[im 59/90  soft-tissue]
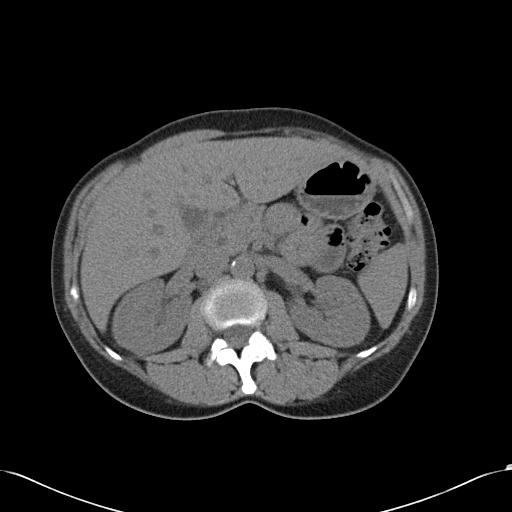
[im 66/90  soft-tissue]
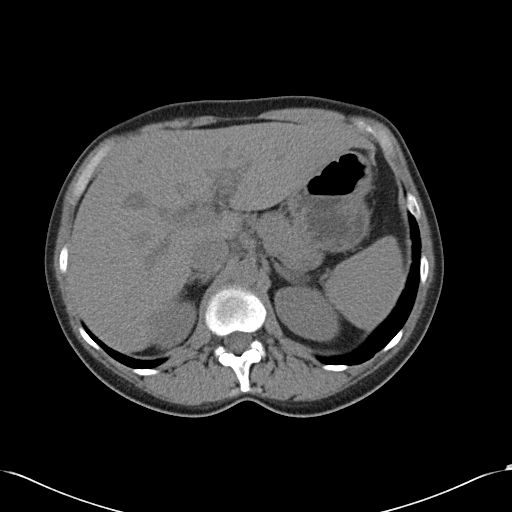
[im 74/90  soft-tissue]
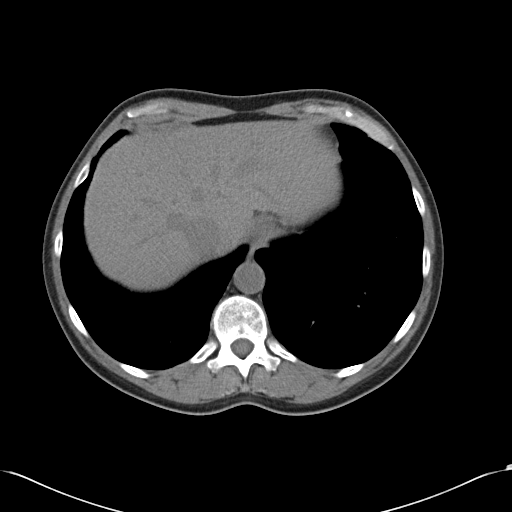
[im 78/90  soft-tissue]
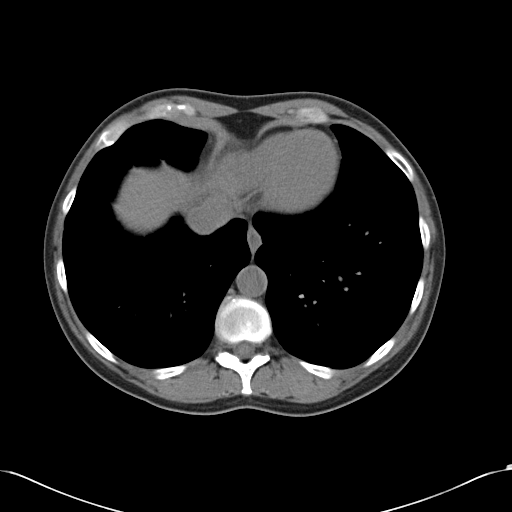
[im 86/90  soft-tissue]
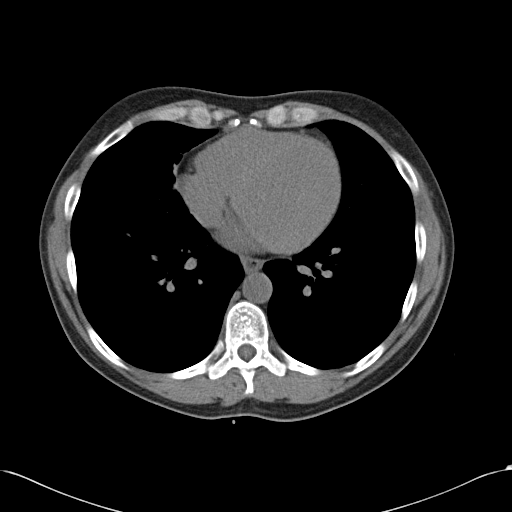

[Series 5: renal stone 3.0 spo cor st · coronal · 1.08mm/px · 3 of 60 slices shown]
[im 20/60  soft-tissue]
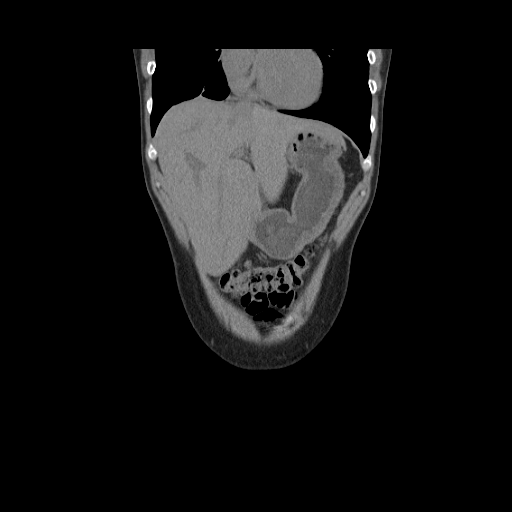
[im 27/60  soft-tissue]
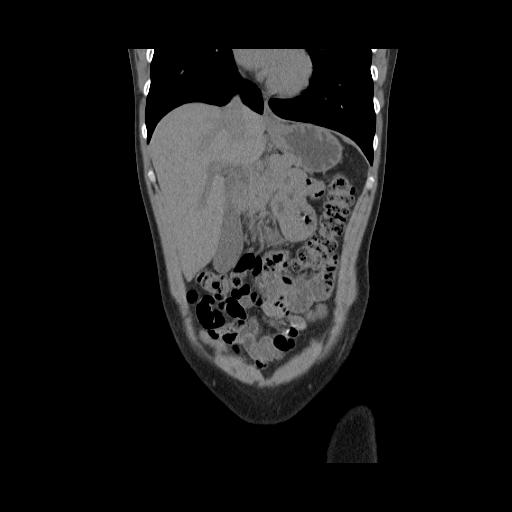
[im 33/60  soft-tissue]
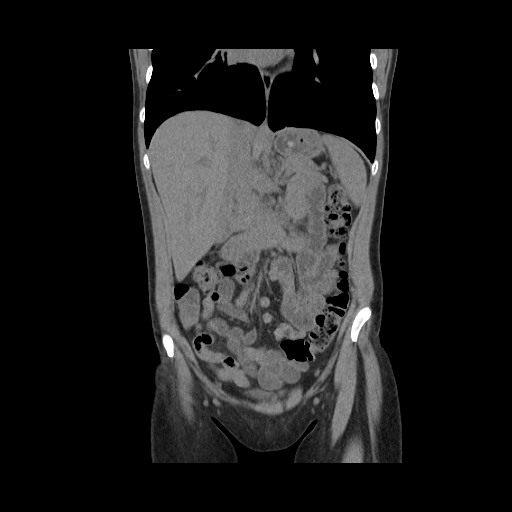

[17 of 46 positions shown; findings below may reference images not displayed]

FINDINGS: There is no evidence of renal calculi or hydronephrosis.
There is no evidence of perinephric fluid or inflammatory changes.
The other abdominal parenchymal organs are also unremarkable
appearance on this noncontrast study.  Gallbladder is unremarkable.

There is no evidence of soft tissue masses or lymphadenopathy.
There is no evidence of inflammatory process or abnormal fluid
collections.  There is no evidence of dilated bowel loops.
IMPRESSION: Negative.  No evidence of renal calculi, hydronephrosis, or other
acute findings.

CT PELVIS
FINDINGS: There is no evidence of ureteral calculi or dilatation.
An IUD is seen in the uterus.  There is no evidence of pelvic mass.
There is no evidence of inflammatory process or dilated bowel
loops.  A small amount of free fluid is seen in the pelvic cul-de-
sac, which is nonspecific and likely physiologic.  Adnexal regions
are unremarkable in appearance.  Normal appendix is visualized.
IMPRESSION: 1.  No evidence of ureteral calculi.
2.  Small amount of free fluid noted in the pelvic cul-de-sac,
which is likely physiologic.

## 2010-07-20 LAB — BASIC METABOLIC PANEL
CO2: 27 mEq/L (ref 19–32)
Chloride: 105 mEq/L (ref 96–112)
GFR calc Af Amer: >60 mL/min (ref >60)
GFR calc non Af Amer: >60 mL/min (ref >60)
GFR calc non Af Amer: >60 mL/min (ref >60)
Glucose, Bld: 98 mg/dL (ref 70–99)
Potassium: 3.6 mEq/L (ref 3.5–5.1)
Potassium: 3.8 mEq/L (ref 3.5–5.1)
Sodium: 137 mEq/L (ref 135–145)
Sodium: 137 mEq/L (ref 135–145)

## 2010-07-20 LAB — DIFFERENTIAL
Basophils Absolute: 0.1 10*3/uL (ref 0.0–0.1)
Eosinophils Relative: 6 % — ABNORMAL HIGH (ref 0–5)
Lymphocytes Relative: 37 % (ref 12–46)
Lymphs Abs: 3.2 10*3/uL (ref 0.7–4.0)
Monocytes Absolute: 0.5 10*3/uL (ref 0.1–1.0)

## 2010-07-20 LAB — LIPASE, BLOOD: Lipase: 28 U/L (ref 11–59)

## 2010-07-20 LAB — CARDIAC PANEL(CRET KIN+CKTOT+MB+TROPI)
CK, MB: 1.5 ng/mL (ref 0.3–4.0)
Relative Index: INVALID (ref 0.0–2.5)
Relative Index: INVALID (ref 0.0–2.5)
Total CK: 60 U/L (ref 7–177)
Troponin I: 0.01 ng/mL (ref 0.00–0.06)
Troponin I: 0.01 ng/mL (ref 0.00–0.06)

## 2010-07-20 LAB — RAPID URINE DRUG SCREEN, HOSP PERFORMED
Amphetamines: NOT DETECTED
Barbiturates: NOT DETECTED
Tetrahydrocannabinol: POSITIVE — AB

## 2010-07-20 LAB — HEPATIC FUNCTION PANEL
Albumin: 4.2 g/dL (ref 3.5–5.2)
Alkaline Phosphatase: 72 U/L (ref 39–117)
Total Protein: 8.1 g/dL (ref 6.0–8.3)

## 2010-07-20 LAB — LIPID PANEL
Cholesterol: 207 mg/dL — ABNORMAL HIGH (ref 0–200)
HDL: 54 mg/dL (ref >39)
Triglycerides: 56 mg/dL (ref <150)

## 2010-07-20 LAB — CBC
HCT: 40.3 % (ref 36.0–46.0)
Hemoglobin: 14.1 g/dL (ref 12.0–15.0)
MCHC: 34.5 g/dL (ref 30.0–36.0)
Platelets: 238 10*3/uL (ref 150–400)
RDW: 13.8 % (ref 11.5–15.5)
RDW: 13.9 % (ref 11.5–15.5)

## 2010-07-20 LAB — PHOSPHORUS: Phosphorus: 3.3 mg/dL (ref 2.3–4.6)

## 2010-07-20 LAB — HEMOGLOBIN A1C
Hgb A1c MFr Bld: 5.7 % (ref 4.6–6.1)
Mean Plasma Glucose: 117 mg/dL

## 2010-07-20 LAB — POCT CARDIAC MARKERS: Troponin i, poc: <0.05 ng/mL (ref 0.00–0.09)

## 2010-07-20 LAB — CK TOTAL AND CKMB (NOT AT ARMC): Relative Index: INVALID (ref 0.0–2.5)

## 2010-07-20 LAB — MAGNESIUM: Magnesium: 2.1 mg/dL (ref 1.5–2.5)

## 2010-07-22 LAB — PROTEIN AND GLUCOSE, CSF
Glucose, CSF: 61 mg/dL (ref 43–76)
Total  Protein, CSF: 41 mg/dL (ref 15–45)

## 2010-07-22 LAB — DIFFERENTIAL
Basophils Relative: 1 % (ref 0–1)
Eosinophils Absolute: 0.1 10*3/uL (ref 0.0–0.7)
Eosinophils Relative: 1 % (ref 0–5)
Monocytes Relative: 4 % (ref 3–12)
Neutrophils Relative %: 74 % (ref 43–77)

## 2010-07-22 LAB — CSF CULTURE W GRAM STAIN: Culture: NO GROWTH

## 2010-07-22 LAB — POCT I-STAT, CHEM 8
Creatinine, Ser: 0.8 mg/dL (ref 0.4–1.2)
Glucose, Bld: 95 mg/dL (ref 70–99)
HCT: 43 % (ref 36.0–46.0)
Hemoglobin: 14.6 g/dL (ref 12.0–15.0)
Potassium: 3.5 mEq/L (ref 3.5–5.1)
Sodium: 139 mEq/L (ref 135–145)
TCO2: 23 mmol/L (ref 0–100)

## 2010-07-22 LAB — CSF CELL COUNT WITH DIFFERENTIAL
RBC Count, CSF: 0 /mm3
Tube #: 4
WBC, CSF: 2 /mm3 (ref 0–5)

## 2010-07-22 LAB — CBC
HCT: 38.9 % (ref 36.0–46.0)
MCHC: 35.3 g/dL (ref 30.0–36.0)
MCV: 102.2 fL — ABNORMAL HIGH (ref 78.0–100.0)
Platelets: 252 10*3/uL (ref 150–400)

## 2010-07-22 LAB — GRAM STAIN

## 2010-07-24 ENCOUNTER — Encounter: Payer: Self-pay | Admitting: Internal Medicine

## 2010-07-24 ENCOUNTER — Other Ambulatory Visit: Payer: Self-pay | Admitting: Internal Medicine

## 2010-07-24 ENCOUNTER — Ambulatory Visit (INDEPENDENT_AMBULATORY_CARE_PROVIDER_SITE_OTHER): Payer: Self-pay | Admitting: Internal Medicine

## 2010-07-24 DIAGNOSIS — G894 Chronic pain syndrome: Secondary | ICD-10-CM

## 2010-07-24 DIAGNOSIS — M549 Dorsalgia, unspecified: Secondary | ICD-10-CM

## 2010-07-24 DIAGNOSIS — G8929 Other chronic pain: Secondary | ICD-10-CM

## 2010-07-24 MED ORDER — AMITRIPTYLINE HCL 50 MG PO TABS: 50.0000 mg | ORAL_TABLET | Freq: Every day | ORAL | Status: DC

## 2010-07-24 MED ORDER — HYDROCODONE-ACETAMINOPHEN 5-500 MG PO TABS: 1.0000 | ORAL_TABLET | Freq: Three times a day (TID) | ORAL | Status: DC | PRN
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
## 2010-07-24 NOTE — Patient Instructions (Signed)
1) Please follow-up at the clinic in 1 month, at which time we will reevaluate your pain, blood pressure. 2) You have been started on new medication(s), if you develop throat closing, tongue swelling, rash, please stop the medication and call the clinic at [phone] and go to the ER. 3) If you have worsening depression, or feelings of hurting yourself or others with the Amitriptyline, stop the medication immediately, and call clinic or go to ER. 4) Please bring all of your medications in a bag to your next visit. 5) If you are diabetic, please bring your meter to your next visit. 6) If symptoms worsen, or new symptoms arise, please call the clinic or go to the ER.

## 2010-07-25 LAB — DRUGS OF ABUSE SCREEN W/O ALC, ROUTINE URINE
Benzodiazepines.: POSITIVE — AB
Creatinine,U: 127.2 mg/dL
Marijuana Metabolite: NEGATIVE
Methadone: NEGATIVE
Phencyclidine (PCP): NEGATIVE
Propoxyphene: NEGATIVE

## 2010-07-26 NOTE — Assessment & Plan Note (Addendum)
Patient has history of chronic back pain, with frequent acute exacerbations just prior to seeing me each time (although has scheduled visits). She is not a surgical candidate per neurosurgery input. Pain described seems to be neuropathic, in addition to known disc disease.  We have tried to send her to the pain management clinic - but states she cannot afford it - was recently referred by the neurosurgeon and states it is costing her $580, which she cannot afford. We discussed regarding her abnormal UDS results, which she cannot explain. [place] [facility] indicates that she is only getting prescriptions from [place]. Last Vicodin use 2 days ago. However, there is some concerning behavior that the patient is exhibiting, specifically, she has been counseled in the past extensively to call me if she is ever prescribed narcotics from anyone aside from our clinic (including all ER visit) - yet she only tells me about the ED visit and prescribed Percocet when probed. Also, she has also been asked to bring all medication bottles to clinic (particularly narcotic meds) on multiple occasions, but again does not have them with her today. Furthermore, when asked about any other substances I may find in her urine - she states "well I took something the other day that my boyfriend gave me, but I don't know what it is, but it felt like a muscle relaxer."   - Will perform UDS today - Provide 1 month supply of Vicodin today - Patient counseled that if UDS abnormal, she will no longer receive controlled substances from our clinic. - Will closely follow-up with UDS results, and will discontinue if abnormal (indicative of additional substances in urine that the patient has not explicitly told me about - only confirms recent Vicodin use 2 days ago) - Start Elavil for neuropathic pain (previously recommended Gabapentin, which she cannot afford).

## 2010-07-28 ENCOUNTER — Encounter: Payer: Self-pay | Admitting: Internal Medicine

## 2010-07-28 NOTE — Progress Notes (Signed)
Addended by: Levy Sjogren on: 07/28/2010 01:44 PM   Modules accepted: Orders

## 2010-07-29 LAB — BENZODIAZEPINE, QUANTITATIVE, URINE
Lorazepam: NEGATIVE NG/ML
Oxazepam GC/MS Conf: NEGATIVE NG/ML
Temazapam: NEGATIVE NG/ML

## 2010-07-31 LAB — OPIATE, QUANTITATIVE, URINE
Hydrocodone: NEGATIVE NG/ML
Hydromorphone - Total: NEGATIVE NG/ML
Oxycodone - Total: 108 NG/ML — ABNORMAL HIGH

## 2010-08-15 ENCOUNTER — Other Ambulatory Visit: Payer: Self-pay | Admitting: *Deleted

## 2010-08-15 NOTE — Telephone Encounter (Signed)
Last refill 3/26   Per pharmacy

## 2010-08-18 MED ORDER — CLONAZEPAM 0.5 MG PO TABS: 0.5000 mg | ORAL_TABLET | Freq: Two times a day (BID) | ORAL | Status: DC | PRN

## 2010-08-19 ENCOUNTER — Ambulatory Visit: Payer: PRIVATE HEALTH INSURANCE | Admitting: Cardiology

## 2010-08-19 NOTE — Telephone Encounter (Signed)
Rx called in 

## 2010-08-25 ENCOUNTER — Ambulatory Visit (INDEPENDENT_AMBULATORY_CARE_PROVIDER_SITE_OTHER): Payer: Self-pay | Admitting: Internal Medicine

## 2010-08-25 ENCOUNTER — Encounter: Payer: Self-pay | Admitting: Internal Medicine

## 2010-08-25 DIAGNOSIS — F419 Anxiety disorder, unspecified: Secondary | ICD-10-CM

## 2010-08-25 DIAGNOSIS — T6391XA Toxic effect of contact with unspecified venomous animal, accidental (unintentional), initial encounter: Secondary | ICD-10-CM

## 2010-08-25 DIAGNOSIS — T63461A Toxic effect of venom of wasps, accidental (unintentional), initial encounter: Secondary | ICD-10-CM

## 2010-08-25 DIAGNOSIS — F329 Major depressive disorder, single episode, unspecified: Secondary | ICD-10-CM

## 2010-08-25 DIAGNOSIS — G8929 Other chronic pain: Secondary | ICD-10-CM

## 2010-08-25 DIAGNOSIS — M549 Dorsalgia, unspecified: Secondary | ICD-10-CM

## 2010-08-25 DIAGNOSIS — F411 Generalized anxiety disorder: Secondary | ICD-10-CM

## 2010-08-25 DIAGNOSIS — T63441A Toxic effect of venom of bees, accidental (unintentional), initial encounter: Secondary | ICD-10-CM

## 2010-08-25 LAB — POCT URINALYSIS DIPSTICK
Glucose, UA: NEGATIVE
Nitrite, UA: NEGATIVE
Protein, UA: NEGATIVE
Spec Grav, UA: 1.025
Urobilinogen, UA: 0.2

## 2010-08-25 MED ORDER — HYDROCODONE-ACETAMINOPHEN 5-500 MG PO TABS: 1.0000 | ORAL_TABLET | Freq: Three times a day (TID) | ORAL | Status: DC | PRN

## 2010-08-25 MED ORDER — EPINEPHRINE 0.3 MG/0.3ML IJ DEVI: 0.3000 mg | Freq: Once | INTRAMUSCULAR | Status: DC

## 2010-08-25 MED ORDER — CLONAZEPAM 0.5 MG PO TABS: 0.5000 mg | ORAL_TABLET | Freq: Two times a day (BID) | ORAL | Status: DC | PRN

## 2010-08-25 MED ORDER — GABAPENTIN 300 MG PO CAPS: 300.0000 mg | ORAL_CAPSULE | Freq: Three times a day (TID) | ORAL | Status: DC

## 2010-08-25 MED ORDER — EPINEPHRINE 0.3 MG/0.3ML IJ DEVI: 0.3000 mg | Freq: Once | INTRAMUSCULAR | Status: AC

## 2010-08-25 NOTE — Progress Notes (Signed)
  Subjective:    Patient ID: Brenda [name], female    DOB: [birth year], 50 y.o.   MRN: [id number]  HPI Ms. Donalee Citrin is a 50 year old woman with past history of chronic low back pain, depression, hypertension who comes in the clinic for regular monthly visit for medication refill. She has back pain for about 20 years now and she says it is gradually getting worse lately. The pain is present all the time about 3-4/10 and has intermittent radiation of pain from low back to the right posterior thigh. The pain gets worse or banding followed or backward, more on bending backward.   She is on Vicodin and needs prescription refills. Denies any bowel, bladder control loss, new sensation changes in her thighs or groin, weakness in her extremities. Denies any fever, chills, chest pain, shortness of breath, palpitations, headache. She was started on amitriptyline during the last visit and she feels that the Neurontin was working better than this.   Review of Systems    as per history of present illness. Objective:   Physical Exam    Constitutional: Vital signs reviewed.  Patient is a well-developed and well-nourished  in no acute distress and cooperative with exam. Alert and oriented x3.  Head: Normocephalic and atraumatic Mouth: no erythema or exudates, MMM Eyes: PERRL, EOMI, conjunctivae normal, No scleral icterus.  Neck: Supple, Trachea midline normal ROM, No JVD, mass, thyromegaly, or carotid bruit present.  Cardiovascular: RRR, S1 normal, S2 normal, no MRG, pulses symmetric and intact bilaterally Pulmonary/Chest: CTAB, no wheezes, rales, or rhonchi Abdominal: Soft. Non-tender, non-distended, bowel sounds are normal, no masses, organomegaly, or guarding present.  GU: no CVA tenderness Musculoskeletal: Decreased range of motion of lumbar region due to pain. Increased pain on bending forward and backward with radiation to right posterior thigh while bending backward.  Neurological: A&O x3, Strenght is  normal and symmetric bilaterally, cranial nerve II-XII are grossly intact, no focal motor deficit, sensory intact to light touch bilaterally.  Skin: Warm, dry and intact. No rash, cyanosis, or clubbing.       Assessment & Plan:

## 2010-08-25 NOTE — Patient Instructions (Signed)
Please make a followup appointment in 4-6 weeks. Please start taking Neurontin 300 mg,  3 times a day and stopped taking amitriptyline from today. Continue doing her stretching exercises and walking as much as you can which will help here back pain along with pain medications.

## 2010-08-25 NOTE — Assessment & Plan Note (Signed)
Having similar low back pain as in the past. Will refill Vicodin for one month. She asked for refill of 120 as it works better for her as she feels that she is missing the final dose but to her to talk about this with her primary care.

## 2010-08-25 NOTE — Assessment & Plan Note (Signed)
We'll refill the Klonopin and make no other changes for now.

## 2010-08-26 NOTE — Discharge Summary (Signed)
NAMEESHANI, MAESTRE NO.:  [id number]   MEDICAL RECORD NO.:  [id number]          PATIENT TYPE:  OBV   LOCATION:  3742                         FACILITY:  [facility]   PHYSICIAN:  Alvester Morin, M.D.  DATE OF BIRTH:  Sep 02, 1960   DATE OF ADMISSION:  11/06/2008  DATE OF DISCHARGE:  11/07/2008                               DISCHARGE SUMMARY   DISCHARGE DIAGNOSES:  1. Chest pain.  2. Chronic back pain.  3. Lower extremity cramping.  4. Hypertension.  5. Anxiety/depression.  6. Uterine fibroids.   DISCHARGE MEDICATIONS:  1. Celexa 40 mg tablets 1-1/2 tablets by mouth once a day.  2. Hydrochlorothiazide 25 mg 1 tablet by mouth once a day.  3. Klonopin 0.5 mg tablets 1 tablet by mouth 2 times a day.  4. Vicodin 5/500 mg tablets, take 1 tablet by mouth every 6 hours as      needed for back pain, 40 pills were dispensed as the patient does      not have any current refills per pharmacy and this was sufficient      amount to get her to her appointment with Dr. Polly Cobia.  5. Atenolol 50 mg tablets 1 tablet by mouth once a day.  6. Diclofenac potassium 50 mg tablets take 1 tablet by mouth 3 times a      day as needed for pain.  7. Gabapentin 300 mg 1 tablet by mouth for 1 week, then 1 tablet by      mouth 2 times a day for 1 week, and then 1 tablet by mouth 3 times      a day.  8. Amitriptyline 25 mg tablets 1 tablet by mouth at bedtime.   DISPOSITION AND FOLLOWUP:  The patient was discharged to home in stable  condition.  She is to follow up with Dr. Polly Cobia in the [facility] on November 16, 2008 at 10:30 a.m.  At this time, please  check the TSH level, as the patient's TSH in the hospital was mildly  elevated at 4.7.  The patient requested Vicodin prescriptions and she  did not have any pills left for her pharmacy, so we ordered enough pills  (40) to get her through until November 16, 2008 followup appointment with  Dr. Polly Cobia.  Given the patient's recent  history of chest pain, we may  want to consider an outpatient Myoview.   PROCEDURES:  No procedures were performed.   CONSULTATIONS:  No consultations.   ADMISSION HISTORY AND PHYSICAL:  Ms. Drewry is a 50 year old female  with a past medical history of hypertension, anxiety disorder,  depression, and chronic back pain who presents with left-sided 10/10  deep chest pain that began 9 days prior to admission and began while she  was doing chores at home.  She describes the pain was like being hit by  a sledgehammer that radiates to her proximal left arm and lasted for  about 5 minutes at 10/10 severity and lingers as an achy pain for an  additional 15-20 minutes.  She has on average 2 episodes  per day that  are associated with diaphoresis and only improved with lying down.  She  describes no aggravating factors.  She denies shortness of breath,  dyspnea on exertion, nausea, vomiting, fevers, headache, and focal  weakness.  She complains of increased fatigue for 9-10 days prior to  admission and a few episodes of diarrhea, however, the diarrhea is  apparently normal for her and is secondary to nerves.  She also  complains of painful leg cramps in her bilateral calves and feet for  about 14 days prior to admission that the patient attributes to possibly  low potassium.   PHYSICAL EXAMINATION:  VITAL SIGNS:  Temperature 97.7, blood pressure  132/75, pulse 70, respiratory rate 16, and oxygen saturation 99% on room  air.  GENERAL:  In general, the patient is well-appearing and in no apparent  distress.  EYES:  Constricted bilaterally secondary to Dilaudid received in the  emergency department, PERRLA.  ENT:  Mucous membranes are moist.  NECK:  Supple, no palpable nodes.  LUNGS:  CTAB, mild diffuse decrease in breath sounds bilaterally.  HEART:  Bradycardic, regular rhythm.  No rubs, murmurs, or gallops.  GI:  Soft, nontender, and nondistended.  Positive bowel sounds.  EXTREMITIES:  No  clubbing, no cyanosis, no edema, and 2+ pulses  bilaterally.  SKIN:  No rashes.  LYMPH:  No palpable lymphadenopathy.  MUSCULOSKELETAL:  5/5 strength bilaterally in upper and lower  extremities.  The chest pain is mildly reproducible with palpation at  the left pectoral muscle.  NEUROLOGIC:  No focal deficits.  PSYCH:  Appropriate.   ADMISSION LABORATORY DATA:  Basic metabolic panel:  Sodium 137,  potassium 3.8, chloride 105, bicarb 27, BUN 16, creatinine 0.86, and  glucose 98.  CBC:  White blood cells 8.6, hemoglobin 14.1, platelets 237, and MCV  102.4.  Hepatic function panel was normal.  Cardiac markers were negative.  Chest x-ray showed no acute pulmonary disease, mild bronchitic changes,  and hyperaeration.  Urine drug screen positive for benzodiazepines, opiates, and THC.  Lipase 28, magnesium 2.1, and phosphorus 3.3.  Lipid panel:  Cholesterol 207, triglycerides 56, HDL 54, and LDL 142.   HOSPITAL COURSE BY PROBLEM:  1. Chest pain.  The patient had negative cardiac markers x3 sets and a      reassuring EKG.  Given the fact that her pain was reproducible on      exam, her age, and the fact that the pain has been going on for 9-      10 days, the pain was thought to be of musculoskeletal origin.      Chest x-ray was negative for pneumonia or any other fluid.  The      patient was discharged on pain medications and a possible      outpatient Myoview may be deemed necessary by Dr. Polly Cobia on her      November 16, 2008 followup.  2. Mildly elevated TSH:  The patient's TSH was mildly elevated at 4.7.      This is to be followed up and rechecked on November 16, 2008 by Dr.      Polly Cobia and he can manage this as an outpatient if necessary.  3. Hypertension.  Her blood pressure remains stable throughout      hospitalization on her home medications.  She was discharged on the      same medications.  4. Anxiety/depression.  We continued her home Klonopin and Celexa and      the  patient did  well during hospitalization.  5. Tobacco abuse.  The patient received tobacco counseling during      hospitalization.  6. Lower extremity cramping:  The patient was asymptomatic for most of      hospitalization and potassium was within normal limits, so this was      thought not to be due to hypokalemia.  CK was normal and needs no      further workup at this time.   DISCHARGE VITAL SIGNS:  Temperature 98.4, blood pressure 105/66, pulse  59, respirations 18, and oxygen saturation 96% on room air.   DISCHARGE LABORATORY DATA:  Cardiac panel was negative.  Vitamin B12  639.  Basic metabolic panel:  Sodium 137, potassium 3.6, chloride 107,  bicarb 25, BUN 14, creatinine 0.64, glucose 90, and calcium 9.0.  Complete blood count:  White blood cells 7.2, hemoglobin 12.9, and MCV  103.      Silvestre Gunner, MD  Electronically Signed      Alvester Morin, M.D.  Electronically Signed    AR/MEDQ  D:  11/07/2008  T:  11/08/2008  Job:  [id number]   cc:   Zara Council, MD  Sibyl Parr Darrick Penna, M.D.
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
## 2010-08-26 NOTE — Discharge Summary (Signed)
Brenda [name], Brenda [name]                ACCOUNT NO.:  [id number]   MEDICAL RECORD NO.:  [id number]          PATIENT TYPE:  INP   LOCATION:  5128                         FACILITY:  [facility]   PHYSICIAN:  C. Ulyess Mort, M.D.DATE OF BIRTH:  [birth year]   DATE OF ADMISSION:  12/01/2007  DATE OF DISCHARGE:  12/02/2007                               DISCHARGE SUMMARY   DISCHARGE DIAGNOSES:  1. Pyelonephritis.  2. Hypertension.  3. Anxiety disorder with depression.  4. Tobacco abuse.  5. Chronic back pain with sciatica secondary to L4 disk herniation in      2008.  6. Trichomoniasis.   DISCHARGE MEDICATIONS:  1. Hydrochlorothiazide 25 mg 1 tablet by mouth daily.  2. Atenolol 50 mg 1 tablet by mouth daily.  3. Klonopin 0.5 mg 1 tablet for by mouth twice a day.  4. Celexa 40 mg 1 tablet by mouth daily.  5. Flexeril 10 mg 1 tablet by mouth at bedtime if needed for muscle      spasm.  6. Ciprofloxacin 500 mg 1 tablet by mouth twice a day for 10 days.  7. Percocet 5/325 mg 1-2 tablet by mouth every 6 hours as needed for      pain.  8. The patient was instructed to stop taking Vicodin since she was      prescribed Vicodin approximately 3 weeks prior to this admission.      Presently, she will be just try Percocet.  She received instruction      not to take both.   DISPOSITION:  The patient was discharged in stable condition with a  hospital followup on December 21, 2007, at the outpatient clinic of  [facility] by Dr. Elby Showers.  At that point, CBC and BMET  are going to be reviewed in order to evaluate electrolytes and renal  function.   PROCEDURES PERFORMED DURING THIS ADMISSION:  There was no procedures  performed.   There was no consultations.   HISTORY OF PRESENT ILLNESS:  Ms. Ostrom is a 50 year old woman with  history of hypertension and history of chronic back pain secondary to L4  disk herniation, who had also history in the past of pyelonephritis and  frequent  UTIs, who developed symptoms of chills, weakness, nausea, and  vomiting associated with lower abdominal pain, flank pain mostly on the  right side.  The patient denies dysuria, dark urine, or bad odor of  urine.  Pregnancy test negative.  The patient mentioned that  approximately 2 days prior to admission, she started developing these  symptoms.  She reports similar episode in the past when she was  diagnosed with pyelonephritis.  This time, the pain is stronger.  She  said that the pain is around 7/10 radiating to the groin area.  She also  reports increase in urine frequency and reports that her symptoms of  anxiety and depression having exacerbated.   PHYSICAL EXAMINATION:  GENERAL:  At the moment of admission, the patient  was in no acute distress.  EYES:  PERRLA.  ENT:  Clear.  NECK:  Supple.  No lymphadenopathy.  RESPIRATORY:  Clear to auscultation bilaterally.  CARDIOVASCULAR:  Regular rate and rhythm.  No murmurs, gallops, or rubs.  GI:  Right lower abdominal pain.  Tender to palpation.  Positive bowel  sounds.  Positive CVA specifically under right flank.  No masses.  No  organomegaly.  EXTREMITIES:  Without edema.  There are no rashes.  No lesions.  SKIN:  Completely intact.  MUSCULOSKELETAL:  The patient was able to move four limbs.  Muscle  strength is 5/5.  NEUROLOGIC:  Cranial nerves intact II through XII.  No sensory loss or  neurologic deficits.  The patient was a little bit anxious with an  appropriate affect overall.   LABORATORY DATA:  Sodium 136, potassium 3.4, chloride 103, bicarb 27,  BUN 7, creatinine 0.75, blood sugar 131.  White blood cells 8.7,  hemoglobin 13.5, platelets 280.  Urine:  Pregnancy negative.  MCV 103.1.  Urinalysis:  Cloudy appearance, specific gravity 1.042, small bilirubin,  trace ketones, moderate blood, positive nitrites, moderate to large  leukocytes, white blood cells 7-10, rbc 3-6, few bacteria, positive for  Trichomonas.  Urine  culture demonstrated more than 100,000 colonies of  E. coli which was sensitive to ciprofloxacin.  The patient had chlamydia  probe in urine, which was negative.  Blood culture which was also  negative during this admission.   HOSPITAL COURSE BY PROBLEM:  1. Pyuria/pyelonephritis.  The patient was admitted.  She was started      on Rocephin and received also Flagyl for her Trichomonas infection      and also received fluids and Zofran to control nausea and vomiting.      Over 24 hours in the hospital, the patient's condition stabilized.      She was afebrile.  She was no longer vomiting without nausea,      tolerating intake and with a culture demonstrating the urinary      infection with Escherichia coli sensitive to ciprofloxacin.  The      decision was to start the patient on ciprofloxacin for 10 days and      to follow at the outpatient clinic as an outpatient.  2. Trichomoniasis treated with Flagyl.  3. Hypokalemia which was mild, most likely secondary to the vomiting      that she was presenting prior to admission.  Potassium was      repleted.  Magnesium was checked at the moment of discharge, but      there was no electrolyte disturbances.  4. Anxiety.  The patient is to continue all her medications using home      regimen which is Klonopin 0.5 mg three times per day and for her      depression we continue using Celexa.  5. Hypertension.  We decided to continue using hydrochlorothiazide 25      mg by mouth daily.  6. For her chronic back pain plus muscle spasms, the patient was      prescribed Percocet which will also help to the pain in her abdomen      for the pyelonephritis and she was already using Flexeril.   The patient is going to be followed at an outpatient clinic on December 21, 2007, and at that moment, electrolytes and renal function are going  to continue to be checked.  The patient received during this admission  help by the hospital pharmacy specifically with  the ciprofloxacin  prescription secondary to lack of finances.  At the moment of  discharge, the  patient's vital signs, temperature 98.5, blood pressure  126/72, heart rate 81, respiratory rate 20, and oxygen saturation 96% on  room air.  White blood cells 8.5, hemoglobin 13.0, platelets 270.  We  have a sodium of 136, potassium 3.8, chloride 105, bicarb 24, BUN 10,  creatinine 0.7, blood sugar 108.      Rosanna Randy, MD  Electronically Signed      C. Ulyess Mort, M.D.  Electronically Signed    CEM/MEDQ  D:  12/26/2007  T:  12/27/2007  Job:  [id number]   cc:   Elby Showers, MD

## 2010-08-26 NOTE — Group Therapy Note (Signed)
NAMEMARICELLA, FILYAW NO.:  [id number]   MEDICAL RECORD NO.:  [id number]          PATIENT TYPE:  WOC   LOCATION:  [facility]                   FACILITY:  [facility]   PHYSICIAN:  Argentina Donovan, MD        DATE OF BIRTH:  [birth year]   DATE OF SERVICE:  03/23/2007                                  CLINIC NOTE   The patient is a 50 year old Caucasian female gravida 5, para 3-0-2-3  who in for insertion of IUD.  We discussed this in detail,  see previous  note and the Mirena IUD was inserted without incident.  The uterus  sounded to 7 cm. The string cut short at 2 cm. The patient also was  complaining of raging hormones has not had a period in 3 months, FSH,  LH, estradiol prolactin and thyroid TSH were drawn. I will have the  patient come back in several months.  Meanwhile she wanted something for  her nerves, I have ordered BuSpar 5 mg one b.i.d. with meals for a week,  then one t.i.d. with meals for a week then two b.i.d. with meals and  hope that will control, although I think that probably as was previously  suggested, [facility] will be a better choice.   IMPRESSION:  1. Satisfactory inserted IUD.  2. Anxiety syndrome.           ______________________________  Argentina Donovan, MD     PR/MEDQ  D:  03/23/2007  T:  03/24/2007  Job:  [id number]
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
## 2010-08-26 NOTE — Op Note (Signed)
Brenda [name], Brenda [name] NO.:  [id number]   MEDICAL RECORD NO.:  [id number]          PATIENT TYPE:  WOC   LOCATION:  WOC                          FACILITY:  [facility]   PHYSICIAN:  Charlynne Pander, D.D.S.DATE OF BIRTH:  [birth year]   DATE OF PROCEDURE:  08/01/2007  DATE OF DISCHARGE:                               OPERATIVE REPORT   PREOPERATIVE DIAGNOSES:  1. History of left facial cellulitis.  2. Chronic apical periodontitis.  3. Fractured distolingual root #14.   POSTOPERATIVE DIAGNOSES:  1. History of left facial cellulitis.  2. Chronic apical periodontitis.  3. Fractured distolingual root #14.   OPERATION:  1. Surgical extraction of tooth #14 with alveoloplasty.   SURGEON:  Charlynne Pander, D.D.S.   ASSISTANT:  Public house manager (Sales executive).   ANESTHESIA:  Local anesthesia only.   MEDICATION:  Local anesthesia with total utilization of three carpules  each containing 36 mg of Xylocaine with 0.018 mg of epinephrine.   SPECIMENS:  There was one tooth #14 that was discarded.   DRAINS:  None.   CULTURES:  None.   COMPLICATIONS:  None.   ESTIMATED BLOOD LOSS:  Less than 10 mls.   FLUIDS:  None.   INDICATIONS:  The patient was recently diagnosed with left facial  cellulitis as an inpatient.  The patient was seen to rule out dental  infection and to provide dental treatment as indicated.  The patient was  examined and treatment planned for surgical extraction of tooth #14 with  alveoloplasty as indicated.  This treatment plan was formulated to  decrease the risk and complications associated with dental infection  from affecting the patient's systemic health.   OPERATIVE FINDINGS:  The patient was examined in the dental medicine  clinic.  Tooth #14 was present with periapical pathology and a fractured  distal lingual root.  The patient also with a history of acute pulpitis  symptoms.  These necessitated removal of tooth #14 with  alveoplasty as  indicated.   DESCRIPTION OF PROCEDURE:  The patient presented to the dental medicine  clinic.  All risks, benefits and complications were explained to the  patient and informed consent was obtained.  The patient was then prepped  and draped in the usual manner for a dental medicine procedure.  A time-  out was performed and the patient was identified and procedures  verified.  At this point in time, local anesthesia was administered with  a total utilization of three carpules each containing 36 mg of Xylocaine  with 0.018 mg of epinephrine.   The maxillary left quadrant was first approached, a 15 blade incision  was made from the distal of the maxillary left tuberosity and extended  to the mesial of #14 and a surgical flap was then carefully reflected.  The fractured aspect of the distal lingual cusp and root was then  removed with a rongeurs without complications.  Appropriate amounts of  buccal and interseptal bone was removed at this time.  Tooth #14 was  then elevated with a series of straight elevators and was removed  without complications with  a 53L forceps.  The socket was curetted and  compressed appropriately.  Alveoplasty had been performed utilizing a  rongeurs and bone file.  The tissues were approximated and trimmed  appropriately.  The maxillary left tuberosity reduction was then  achieved utilizing a 15 blade in a series of incisions to remove excess  tissues.  The surgical site was then irrigated with copious amounts of  sterile saline.  The tissues were then again approximated and trimmed  appropriately.  The surgical site was then irrigated with copious  amounts sterile saline.  The surgical site was then closed from the  maxillary left tuberosity and extended to the mesial of #14 utilizing 3-  0 chromic gut suture in a continuous interrupted suture technique x1.  A  series of 4x4 gauze were placed in the mouth to aid hemostasis.  The  patient was  then given postop instructions written and verbal.  The  patient was then given a prescription for Percocet pain medication using  1-2 tablets every 6 hours as needed for pain.  No further antibiotic  therapy is needed at this time but the patient is to finish taking her  antibiotic therapy as prescribed by Dr. Maurice March.  The patient will be seen  in approximately 1 week for evaluation for suture removal.  The patient  is to contact dental medicine if acute problems arise.  The patient was  instructed to follow up with a general dentist of her choice for an  exam, dental x-rays and treatment planning for her dental needs.  The  patient was dismissed in stable condition to the care of her friend.  All counts were correct for the dental medicine procedure.      Charlynne Pander, D.D.S.  Electronically Signed     RFK/MEDQ  D:  08/01/2007  T:  08/01/2007  Job:  [id number]   cc:   Fransisco Hertz, M.D.  Fax: [phone]

## 2010-08-27 NOTE — Progress Notes (Signed)
Addended by: Theotis Barrio on: 08/27/2010 11:09 AM   Modules accepted: Orders

## 2010-08-29 NOTE — Discharge Summary (Signed)
NAMEMARRISA, Brenda [name]                          ACCOUNT NO.:  [id number]   MEDICAL RECORD NO.:  [id number]                   PATIENT TYPE:  INP   LOCATION:  5504                                 FACILITY:  [facility]   PHYSICIAN:  Ileana Roup, M.D.               DATE OF BIRTH:  [birth year]   DATE OF ADMISSION:  07/11/2003  DATE OF DISCHARGE:  07/15/2003                                 DISCHARGE SUMMARY   DISCHARGE DIAGNOSES:  1. Acute pyelonephritis.  2. Elevated blood pressure.  3. Hypokalemia.  4. Normocytic anemia.   DISCHARGE MEDICATIONS:  1. Cipro 500 mg p.o. b.i.d. x10 days.  2. Percocet 5/325 one to two q.6h p.r.n. for pain one weeks' worth.  3. Tylenol 650 mg p.o. q.8h p.r.n. for pain following Percocet.   HISTORY OF PRESENT ILLNESS:  The patient is a 50 year old woman with history  of hypertension and two-day history of malaise with flank pain who was  admitted from clinic for this.  She stated that she had subjective fever  starting one day prior to admission, positive nausea but no vomiting,  positive headache, and positive polyuria, but denied any dysuria.   LABORATORY DATA:  Pertinent lab work obtained during hospitalization and  upon discharge.  Initial white count was 14.2 and trended down to 7.4 upon  discharge.  Initial hemoglobin was 12.9, got as low as 10.7, but was 11.7  upon discharge.  MCV 96.7, platelet count 300.  Initial sodium was 128, upon  discharge was 136, potassium upon admission was 2.7 trended up to 4.0 upon  discharge.  Creatinine was normal at 0.6, BUN 5 to 7.  All liver enzymes  were normal.  Magnesium was normal at 2.1.  Initial UA showed 30 of protein,  15 ketones, positive nitrites, small leukocyte esterase, many bacteria  including Trichomonas.  Blood cultures x2 were negative.  Urine culture  showed insignificant growth.  Trich and clue cells were seen on wet prep.  GC and Chlamydia were negative.  RPR was nonreactive.   Also obtained  during hospitalization was abdominal CT scan which showed  retroperitoneal inflammation about the right kidney extending inferiorly  adjacent to the right colon, thought to represent pyelonephritis.  No  obstruction or hydronephrosis.  No renal calculi seen.  CT scan of pelvis  showed no acute finding.   HOSPITAL COURSE:  Problem 1.  Acute pyelonephritis.  The patient was  initially treated with Cipro upon admission to the hospital and was  continued on this initially IV and switched to p.o. prior to discharge.  She  is to complete a 14-day course for this.  The pain associated with  pyelonephritis was initially treated with IV pain medicine Dilaudid and  transitioned over to p.o. pain medicine which adequately controlled her pain  upon discharge.  After her initial temperature of 102.2 she did not have any  following fever recorded during  hospitalization.  Her white count trended  down until discharge.   Problem 2.  History of hypertension.  During hospitalization, blood  pressures were well controlled without any medicines.  She was held on any  medicine that she was given previously which was propranolol for headaches.  Upon discharge, her blood pressures were ranging in the 110's to 120's  systolic over 60's to 70's with a heart rate ranging from 60's to 80's.  No  further intervention was made for hypertension felt secondary to pain.   Problem 3.  Hypokalemia.  Initially had a low level of 2.7 felt to be  secondary to polyuria and some mild diarrhea.  This all resolved during  hospitalization only requiring repletion for two days.  Was sent out with  potassium of 4.0.   Problem 4.  Anemia.  Initially came in with a hemoglobin of 12.5 which  trended down to 10.7.  Did not have any signs of bleed and was initially  heme negative.  Upon discharge, her hemoglobin was up to 11.7.  This was  normocytic with MCV in the 94 to 96 range.  If this continues to be slightly  low, it may be  secondary to some mild iron deficiency, but this can be  worked up as an outpatient.   DISPOSITION:  The patient is to call the clinic or the emergency department  if she has any fever greater than 101.3 or if she has any vomiting, is  unable to keep in any fluids, or back pain becomes worse.  She is also to  call outpatient clinic and follow up with Dr. Margo Aye on Monday, April 11 at  2:30.  The patient is understanding of the above suggestion and will follow  up in one week with taking all of her Cipro.      Catalina Pizza, M.D.                           Ileana Roup, M.D.    ZH/MEDQ  D:  08/01/2003  T:  08/03/2003  Job:  [id number]

## 2010-08-29 NOTE — Op Note (Signed)
Brenda [name], Brenda [name]                ACCOUNT NO.:  [id number]   MEDICAL RECORD NO.:  [id number]          PATIENT TYPE:  INP   LOCATION:  6709                         FACILITY:  [facility]   PHYSICIAN:  Gabrielle Dare. Janee Morn, M.D.DATE OF BIRTH:  December 07, 1960   DATE OF PROCEDURE:  12/09/2005  DATE OF DISCHARGE:                                 OPERATIVE REPORT   PREOPERATIVE DIAGNOSIS:  Left upper buttock abscess.   POSTOPERATIVE DIAGNOSIS:  Left upper buttock abscess.   PROCEDURE:  Incision and drainage of abscess left upper buttock.   HISTORY OF PRESENT ILLNESS:  The patient is 50 year old female who was seen  in the emergency department at [facility] 2 days ago for abscess on her left  upper buttock.  This was I&D'd and packing was placed in the emergency  department.  She presented back there today for follow-up and was noted to  have some increased cellulitic changes and we were asked to see her.  Indeed  she has worsening of this abscess and we are proceeding with incision and  drainage at the bedside.   PROCEDURE IN DETAIL:  Informed consent was obtained, the patient received  some intravenous pain medication.  The area on her left upper quadrant was  prepped and draped in sterile fashion.  Some 2% lidocaine with epinephrine  was injected for local pain control.  Transverse incision about 2 cm in size  was made and the wound was explored.  Some small loculations were broken up  and some cloudy fluid was evacuated.  This was sent for culture.  Hemostasis  was ensured and the wound was packed with 1/4 inch iodoform gauze and a  sterile dressing was placed.  The patient tolerated procedure well.      Gabrielle Dare Janee Morn, M.D.  Electronically Signed     BET/MEDQ  D:  12/09/2005  T:  12/10/2005  Job:  [id number]

## 2010-08-29 NOTE — H&P (Signed)
NAMESAHAR, RYBACK NO.:  [id number]   MEDICAL RECORD NO.:  [id number]          PATIENT TYPE:  EMS   LOCATION:  MAJO                         FACILITY:  [facility]   PHYSICIAN:  Gabrielle Dare. Janee Morn, M.D.DATE OF BIRTH:  November 06, 1960   DATE OF ADMISSION:  12/09/2005  DATE OF DISCHARGE:                                HISTORY & PHYSICAL   CHIEF COMPLAINT:  Abscess left buttock   HISTORY OF PRESENT ILLNESS:  The patient is a 50 year old white female who  is seen emergency department 12/07/2005 for an abscess in her left upper  buttock.  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 She thought she may  have been bitten by some insect. The area got more red and had more pain the  following day.  She came emergency department for evaluation.  She had  incision and drainage done by the emergency department staff and she was  placed on doxycycline and Vicodin.  She returned today for packing change  and wound recheck was noted to have increased amount of surrounding  cellulitis.  We were asked to evaluate.   PAST MEDICAL HISTORY:  Pyelonephritis with resulting renal dysfunction and  CHF that resolved. The patient was hospitalized for that a couple years ago.  She denies any other current medical problems.   CURRENT MEDICATIONS:  She is taking some trazodone of unknown dose from a  friend to help her sleep.  She is also taking doxycycline and Vicodin as  prescribed by the emergency department physician.   FAMILY HISTORY:  She denies.   REVIEW OF SYSTEMS:  15 system review was done and was negative except for  localized pain in the area of her upper left buttock.   PHYSICAL EXAM:  Temperature 98.5, pulse 86, respirations 20, blood pressure  171/90.  GENERAL:  She is mildly anxious but otherwise in no acute distress.  HEENT: Pupils are equal.  Sclerae is clear.  Neck is supple.  LUNGS:  Are clear to  auscultation with good respiratory excursion.  HEART:  Is regular.  No murmurs heard.  ABDOMEN: Soft and nontender.  Bowel sounds are normal.  SKIN:  Exam reveals the lesion on the left upper buttock with a 2 cm  indurated, erythematous area with some fluctuance and approximately 5 mm  central opening with iodoform packing.  Packing was removed and there was  minimal discharge.  There is a large pale circle of surrounding erythema and  localized tenderness.  EXTREMITIES:  Are otherwise warm with good distal pulses.   LABORATORY STUDIES:  Are pending.   IMPRESSION:  Left buttock abscess with increasing cellulitic changes.   PLAN:  Will be to admit to the hospital.  We will further incision and  drainage of this area the emergency department.  We will subsequently check  cultures of the wound and admit her for wound care and intravenous  antibiotics.  Procedure and plan was discussed in detail with the patient.  Questions were answered.  Gabrielle Dare Janee Morn, M.D.  Electronically Signed     BET/MEDQ  D:  12/09/2005  T:  12/09/2005  Job:  [id number]

## 2010-09-23 ENCOUNTER — Ambulatory Visit (INDEPENDENT_AMBULATORY_CARE_PROVIDER_SITE_OTHER): Payer: Self-pay | Admitting: Internal Medicine

## 2010-09-23 ENCOUNTER — Encounter: Payer: Self-pay | Admitting: Internal Medicine

## 2010-09-23 DIAGNOSIS — I2510 Atherosclerotic heart disease of native coronary artery without angina pectoris: Secondary | ICD-10-CM

## 2010-09-23 DIAGNOSIS — J302 Other seasonal allergic rhinitis: Secondary | ICD-10-CM

## 2010-09-23 DIAGNOSIS — T7491XA Unspecified adult maltreatment, confirmed, initial encounter: Secondary | ICD-10-CM

## 2010-09-23 DIAGNOSIS — F329 Major depressive disorder, single episode, unspecified: Secondary | ICD-10-CM

## 2010-09-23 DIAGNOSIS — I10 Essential (primary) hypertension: Secondary | ICD-10-CM

## 2010-09-23 DIAGNOSIS — J309 Allergic rhinitis, unspecified: Secondary | ICD-10-CM

## 2010-09-23 DIAGNOSIS — M549 Dorsalgia, unspecified: Secondary | ICD-10-CM

## 2010-09-23 MED ORDER — HYDROCHLOROTHIAZIDE 25 MG PO TABS: 25.0000 mg | ORAL_TABLET | Freq: Every day | ORAL | Status: DC

## 2010-09-23 MED ORDER — LISINOPRIL 20 MG PO TABS: 20.0000 mg | ORAL_TABLET | Freq: Every day | ORAL | Status: DC

## 2010-09-23 MED ORDER — LORATADINE 10 MG PO TABS: 10.0000 mg | ORAL_TABLET | Freq: Every day | ORAL | Status: DC

## 2010-09-23 MED ORDER — NITROGLYCERIN 0.4 MG SL SUBL: 0.4000 mg | SUBLINGUAL_TABLET | SUBLINGUAL | Status: DC | PRN

## 2010-09-23 MED ORDER — CYCLOBENZAPRINE HCL 5 MG PO TABS: 5.0000 mg | ORAL_TABLET | Freq: Three times a day (TID) | ORAL | Status: DC | PRN

## 2010-09-23 MED ORDER — GABAPENTIN 300 MG PO CAPS: 300.0000 mg | ORAL_CAPSULE | Freq: Three times a day (TID) | ORAL | Status: DC

## 2010-09-23 MED ORDER — HYDROCODONE-ACETAMINOPHEN 5-500 MG PO TABS: 1.0000 | ORAL_TABLET | Freq: Three times a day (TID) | ORAL | Status: DC | PRN

## 2010-09-23 MED ORDER — PRAVASTATIN SODIUM 80 MG PO TABS: 80.0000 mg | ORAL_TABLET | Freq: Every day | ORAL | Status: DC

## 2010-09-23 MED ORDER — AMLODIPINE BESYLATE 2.5 MG PO TABS: 2.5000 mg | ORAL_TABLET | Freq: Every day | ORAL | Status: DC

## 2010-09-23 MED ORDER — CITALOPRAM HYDROBROMIDE 20 MG PO TABS: 20.0000 mg | ORAL_TABLET | Freq: Every day | ORAL | Status: DC

## 2010-09-23 MED ORDER — CLONAZEPAM 0.5 MG PO TABS: 0.5000 mg | ORAL_TABLET | Freq: Two times a day (BID) | ORAL | Status: DC | PRN

## 2010-09-23 NOTE — Assessment & Plan Note (Signed)
Has history of cocaine abuse, therefore reluctant to provide vicodin. But at this time, I am inclined to believe her and provide some pain relief. She should be screened on next visit.

## 2010-09-23 NOTE — Patient Instructions (Signed)
Return in one month.  

## 2010-09-23 NOTE — Assessment & Plan Note (Signed)
BP well elevated as she did not take her meds. Restart meds.

## 2010-09-23 NOTE — Assessment & Plan Note (Signed)
Gave tools and helpline. Patient feels safe for now and not suicidal.
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
## 2010-09-24 ENCOUNTER — Encounter: Payer: Self-pay | Admitting: Internal Medicine

## 2010-09-30 ENCOUNTER — Ambulatory Visit: Payer: Self-pay | Admitting: Cardiology

## 2010-09-30 ENCOUNTER — Emergency Department (HOSPITAL_COMMUNITY)
Admission: EM | Admit: 2010-09-30 | Discharge: 2010-10-01 | Disposition: A | Discharge status: Home / Self Care | Payer: Self-pay | Attending: Emergency Medicine | Admitting: Emergency Medicine

## 2010-09-30 DIAGNOSIS — I252 Old myocardial infarction: Secondary | ICD-10-CM | POA: Insufficient documentation

## 2010-09-30 DIAGNOSIS — Y92009 Unspecified place in unspecified non-institutional (private) residence as the place of occurrence of the external cause: Secondary | ICD-10-CM | POA: Insufficient documentation

## 2010-09-30 DIAGNOSIS — S40029A Contusion of unspecified upper arm, initial encounter: Secondary | ICD-10-CM | POA: Insufficient documentation

## 2010-09-30 DIAGNOSIS — IMO0002 Reserved for concepts with insufficient information to code with codable children: Secondary | ICD-10-CM | POA: Insufficient documentation

## 2010-09-30 DIAGNOSIS — E870 Hyperosmolality and hypernatremia: Secondary | ICD-10-CM | POA: Insufficient documentation

## 2010-09-30 DIAGNOSIS — M545 Low back pain, unspecified: Secondary | ICD-10-CM | POA: Insufficient documentation

## 2010-09-30 DIAGNOSIS — R4789 Other speech disturbances: Secondary | ICD-10-CM | POA: Insufficient documentation

## 2010-09-30 DIAGNOSIS — I10 Essential (primary) hypertension: Secondary | ICD-10-CM | POA: Insufficient documentation

## 2010-09-30 DIAGNOSIS — E876 Hypokalemia: Secondary | ICD-10-CM | POA: Insufficient documentation

## 2010-09-30 DIAGNOSIS — Z79899 Other long term (current) drug therapy: Secondary | ICD-10-CM | POA: Insufficient documentation

## 2010-09-30 DIAGNOSIS — Z7982 Long term (current) use of aspirin: Secondary | ICD-10-CM | POA: Insufficient documentation

## 2010-09-30 DIAGNOSIS — F341 Dysthymic disorder: Secondary | ICD-10-CM | POA: Insufficient documentation

## 2010-09-30 DIAGNOSIS — M542 Cervicalgia: Secondary | ICD-10-CM | POA: Insufficient documentation

## 2010-10-01 ENCOUNTER — Encounter: Payer: Self-pay | Admitting: Cardiology

## 2010-10-01 ENCOUNTER — Emergency Department (HOSPITAL_COMMUNITY): Payer: Self-pay

## 2010-10-01 LAB — RAPID URINE DRUG SCREEN, HOSP PERFORMED
Amphetamines: NOT DETECTED
Benzodiazepines: NOT DETECTED
Cocaine: NOT DETECTED
Opiates: NOT DETECTED
Tetrahydrocannabinol: NOT DETECTED

## 2010-10-01 LAB — URINE MICROSCOPIC-ADD ON

## 2010-10-01 LAB — URINALYSIS, ROUTINE W REFLEX MICROSCOPIC
Bilirubin Urine: NEGATIVE
Glucose, UA: NEGATIVE mg/dL
Protein, ur: NEGATIVE mg/dL
Urobilinogen, UA: 0.2 mg/dL (ref 0.0–1.0)

## 2010-10-01 LAB — POCT I-STAT, CHEM 8
BUN: 11 mg/dL (ref 6–23)
Chloride: 115 mEq/L — ABNORMAL HIGH (ref 96–112)
HCT: 41 % (ref 36.0–46.0)
Potassium: 3.4 mEq/L — ABNORMAL LOW (ref 3.5–5.1)
Sodium: 148 mEq/L — ABNORMAL HIGH (ref 135–145)

## 2010-10-07 ENCOUNTER — Encounter: Payer: Self-pay | Admitting: Cardiology

## 2010-10-20 ENCOUNTER — Other Ambulatory Visit: Payer: Self-pay | Admitting: Internal Medicine

## 2010-10-20 ENCOUNTER — Telehealth: Payer: Self-pay | Admitting: Internal Medicine

## 2010-10-20 NOTE — Telephone Encounter (Signed)
Message copied by Edsel Petrin on Mon Oct 20, 2010 10:38 AM ------      Message from: Priscella Mann      Created: Fri Jul 04, 2010 11:54 PM      Regarding: UDS results       Hi Beth and Dr. Rogelia Boga, I wanted to please get your input regarding UDS results that I got, actually for two patients:            1) Noel Gerold - Ms. Lepp is getting Vicodin 5-325mg  TID from our clinic. 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 She has a history of remote cocaine abuse and marijuana use, quit cocaine in 08/2009 (confirmed on UDS). So, as part of her pain contract I did a UDS for this patient. Results showed (+) hydrocodone, hydromorphone, oxymorphone. I am not completely sure how to interpret this - she is only supposed to be getting vicodin, therefore, i am not clear why she has oxymorphone positive results. I would appreciate any input on how to proceed from here - to call her in to discuss results and cancel pain contract or what?            I will send a separate message attached to my other patient's file.            Thank you so much.            Sincerely,      Burnett Harry
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
## 2010-10-20 NOTE — Telephone Encounter (Signed)
Message copied by Edsel Petrin on Mon Oct 20, 2010 10:39 AM ------      Message from: Priscella Mann      Created: Fri Jul 04, 2010 11:54 PM      Regarding: UDS results       Hi Beth and Dr. Rogelia Boga, I wanted to please get your input regarding UDS results that I got, actually for two patients:            1) Brenda [name] - Ms. Vondrak is getting Vicodin 5-325mg  TID from our clinic. 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 She has a history of remote cocaine abuse and marijuana use, quit cocaine in 08/2009 (confirmed on UDS). So, as part of her pain contract I did a UDS for this patient. Results showed (+) hydrocodone, hydromorphone, oxymorphone. I am not completely sure how to interpret this - she is only supposed to be getting vicodin, therefore, i am not clear why she has oxymorphone positive results. I would appreciate any input on how to proceed from here - to call her in to discuss results and cancel pain contract or what?            I will send a separate message attached to my other patient's file.            Thank you so much.            Sincerely,      Burnett Harry

## 2010-10-23 ENCOUNTER — Encounter: Payer: Self-pay | Admitting: Internal Medicine

## 2010-10-25 ENCOUNTER — Emergency Department (HOSPITAL_COMMUNITY): Payer: Self-pay

## 2010-10-25 ENCOUNTER — Emergency Department (HOSPITAL_COMMUNITY)
Admission: EM | Admit: 2010-10-25 | Discharge: 2010-10-25 | Disposition: A | Discharge status: Home / Self Care | Payer: Self-pay | Attending: Emergency Medicine | Admitting: Emergency Medicine

## 2010-10-25 DIAGNOSIS — M79609 Pain in unspecified limb: Secondary | ICD-10-CM | POA: Insufficient documentation

## 2010-10-25 DIAGNOSIS — M25476 Effusion, unspecified foot: Secondary | ICD-10-CM | POA: Insufficient documentation

## 2010-10-25 DIAGNOSIS — S93409A Sprain of unspecified ligament of unspecified ankle, initial encounter: Secondary | ICD-10-CM | POA: Insufficient documentation

## 2010-10-25 DIAGNOSIS — S9030XA Contusion of unspecified foot, initial encounter: Secondary | ICD-10-CM | POA: Insufficient documentation

## 2010-10-25 DIAGNOSIS — I252 Old myocardial infarction: Secondary | ICD-10-CM | POA: Insufficient documentation

## 2010-10-25 DIAGNOSIS — M25579 Pain in unspecified ankle and joints of unspecified foot: Secondary | ICD-10-CM | POA: Insufficient documentation

## 2010-10-25 DIAGNOSIS — M25473 Effusion, unspecified ankle: Secondary | ICD-10-CM | POA: Insufficient documentation

## 2010-10-25 DIAGNOSIS — G8929 Other chronic pain: Secondary | ICD-10-CM | POA: Insufficient documentation

## 2010-10-25 DIAGNOSIS — M7989 Other specified soft tissue disorders: Secondary | ICD-10-CM | POA: Insufficient documentation

## 2010-10-25 DIAGNOSIS — F341 Dysthymic disorder: Secondary | ICD-10-CM | POA: Insufficient documentation

## 2010-10-25 DIAGNOSIS — Z79899 Other long term (current) drug therapy: Secondary | ICD-10-CM | POA: Insufficient documentation

## 2010-10-25 DIAGNOSIS — I10 Essential (primary) hypertension: Secondary | ICD-10-CM | POA: Insufficient documentation

## 2010-10-25 DIAGNOSIS — M549 Dorsalgia, unspecified: Secondary | ICD-10-CM | POA: Insufficient documentation

## 2010-10-25 DIAGNOSIS — S99929A Unspecified injury of unspecified foot, initial encounter: Secondary | ICD-10-CM | POA: Insufficient documentation

## 2010-10-25 DIAGNOSIS — X500XXA Overexertion from strenuous movement or load, initial encounter: Secondary | ICD-10-CM | POA: Insufficient documentation

## 2010-10-25 DIAGNOSIS — S8990XA Unspecified injury of unspecified lower leg, initial encounter: Secondary | ICD-10-CM | POA: Insufficient documentation

## 2010-10-26 IMAGING — CR DG FOOT COMPLETE 3+V*L*
3 series · 3 of 3 positions shown · non-contrast
Comparison: 10/22/2005

CLINICAL DATA: The interior.  Pain and swelling of the great toe.

LEFT FOOT - COMPLETE 3+ VIEW

[t foot ap left]
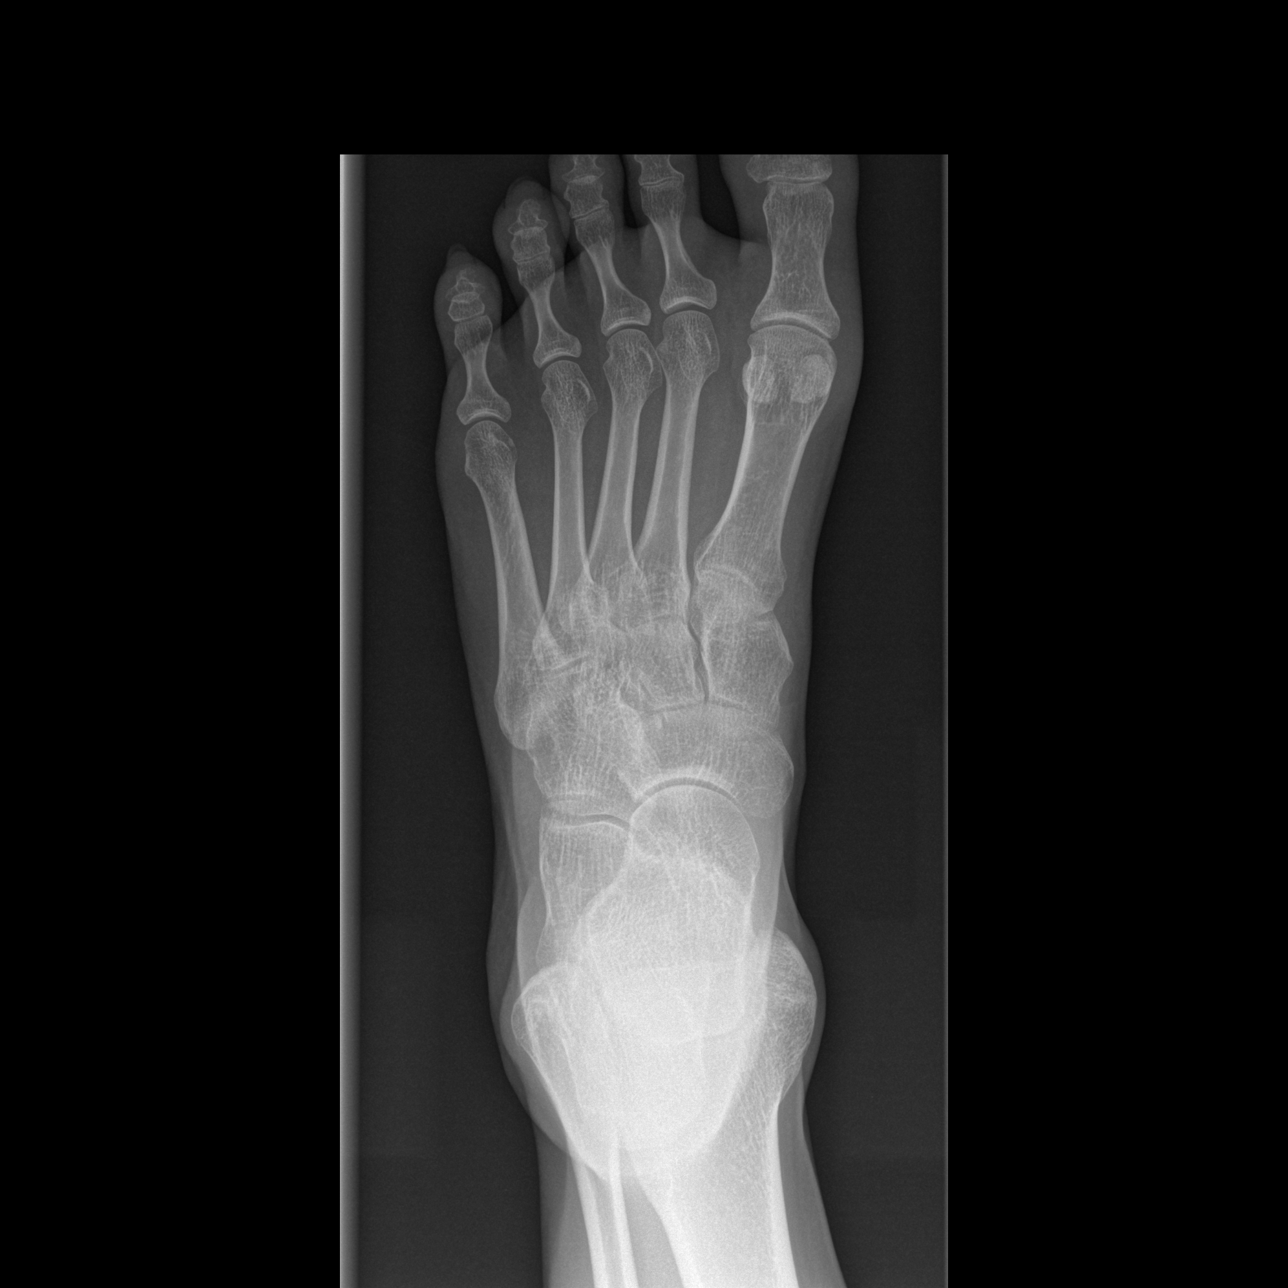

[t foot oblique left]
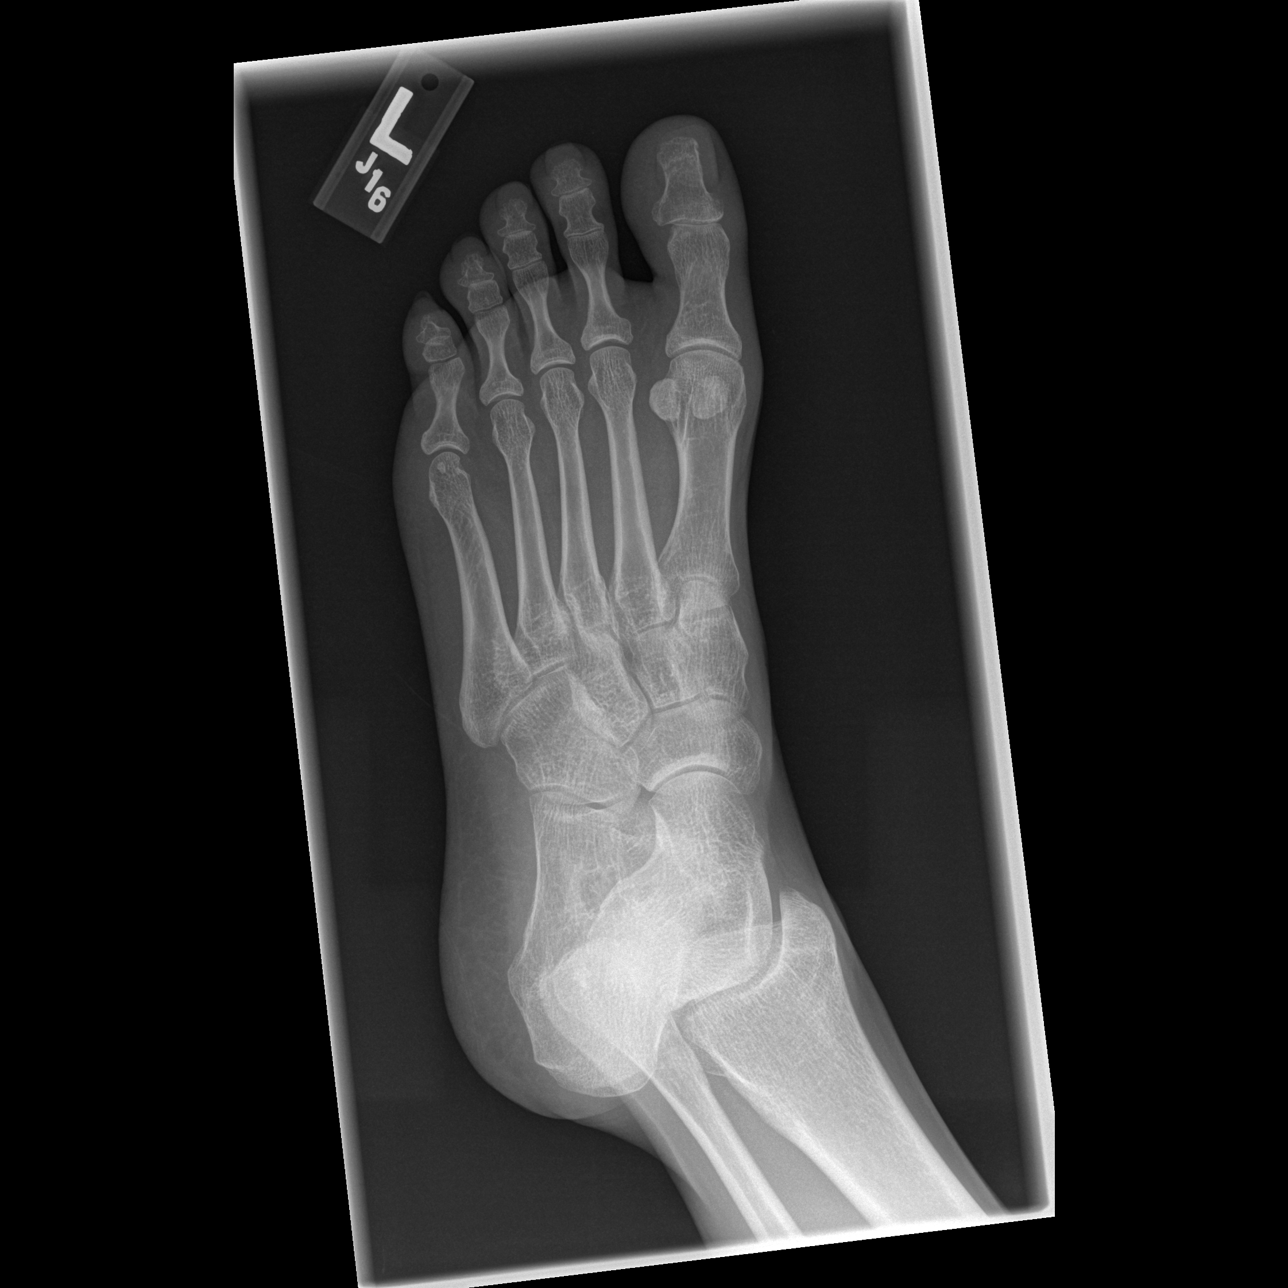

[t foot lat left]
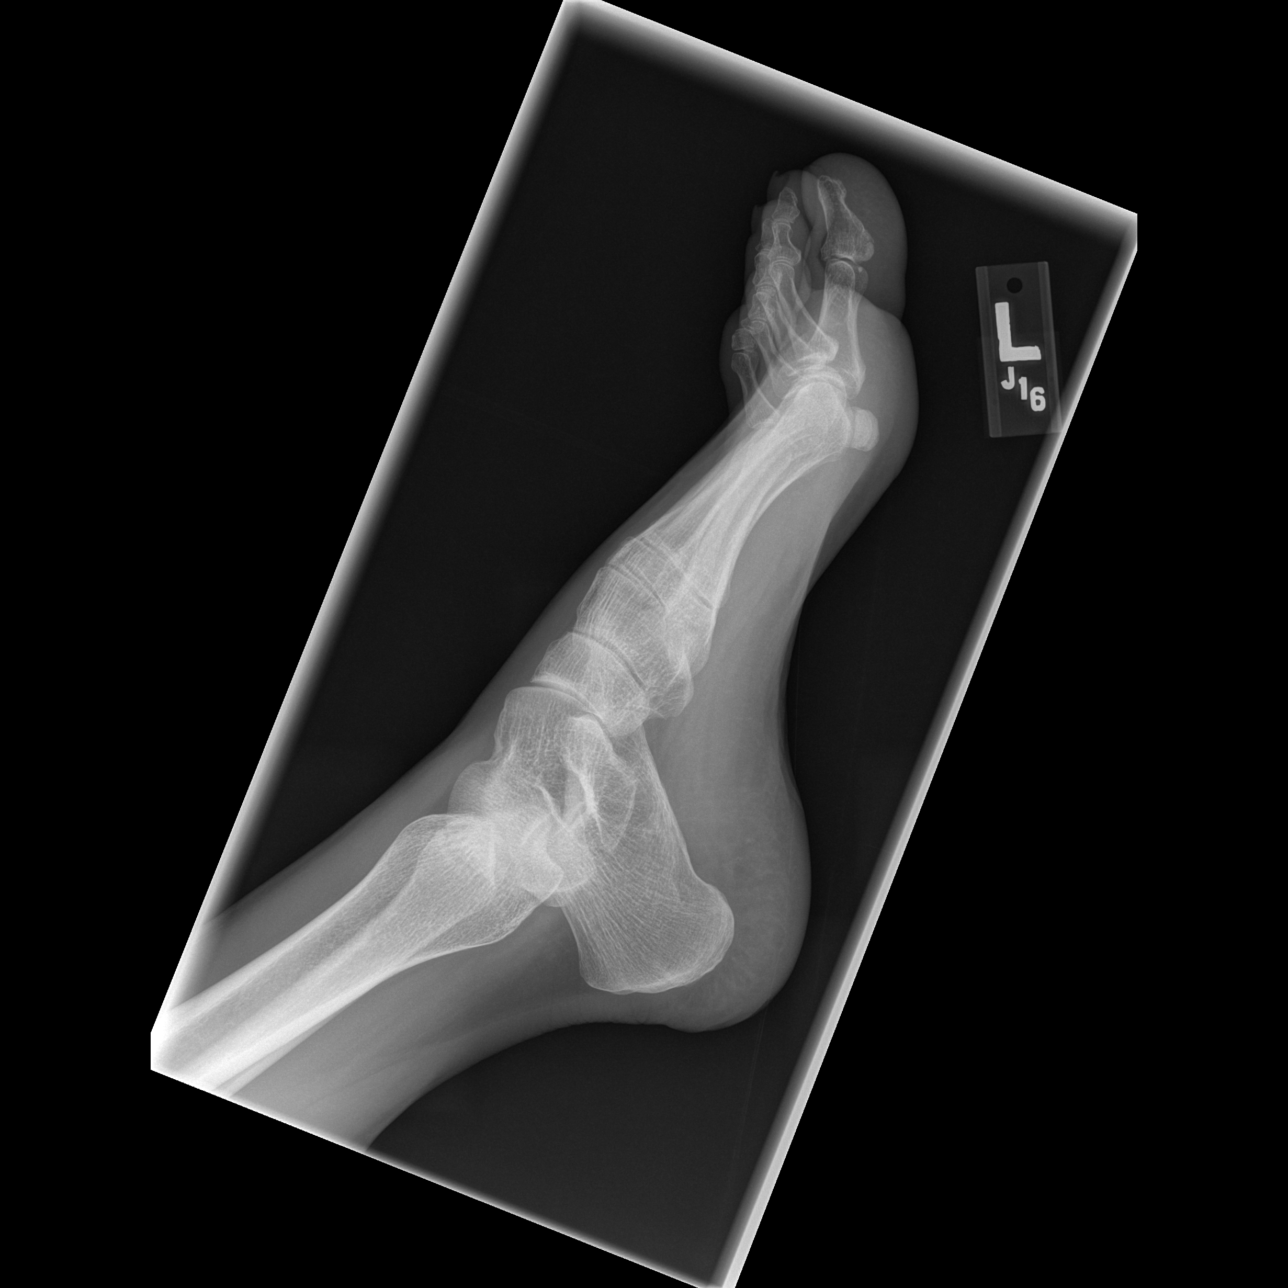

[3 of 3 positions shown; findings below may reference images not displayed]

FINDINGS: There is a questionable minimal cortical fracture of the
proximal plantar corner of the distal phalanx of the great toe.  I
do not think this is definite.  Otherwise, the bones of the great
toe and the remainder of the foot appear unremarkable.
IMPRESSION: Question minimal cortical fracture of the distal phalanx.  See
above discussion.  This is not definite.

## 2010-10-27 ENCOUNTER — Encounter: Payer: Self-pay | Admitting: Internal Medicine

## 2010-10-27 ENCOUNTER — Other Ambulatory Visit: Payer: Self-pay | Admitting: *Deleted

## 2010-10-27 DIAGNOSIS — M549 Dorsalgia, unspecified: Secondary | ICD-10-CM

## 2010-10-27 NOTE — Progress Notes (Signed)
I reviewed patient's case with Dr. Julaine Fusi, and we agreed that Brenda [name] has had multiple pain contract violations recently - Patient has had multiple infractions of her pain contract with abn UDS (06/2010 and 07/2010), multiple red flag behaviors, persistent return to ER for pain meds without informing [facility] (which she has been specifically counseled upon) last in 10/01/2010, and calling for early RX refills - which are all violations of her pain contract.  Therefore, this patient will no longer be prescribed narcotics from [facility]. I attempted to call Brenda [name] to inform her of this, however, only voicemail was reached. I have therefore asked for Brenda [name] to set her up for a clinic appt with me to address this issue.

## 2010-10-28 NOTE — Telephone Encounter (Signed)
I have already responded to this request. It is documented in pts chart that she is not to receive narcotics from our clinic as she has violated our pain contract. i have already refilled her klonopin. Please review the chart that details this. She should be scheduled for an appt with me asap.  Johnette Abraham, D.O.

## 2010-10-30 ENCOUNTER — Encounter: Payer: Self-pay | Admitting: Internal Medicine

## 2010-11-04 ENCOUNTER — Emergency Department (HOSPITAL_COMMUNITY): Payer: Self-pay

## 2010-11-04 ENCOUNTER — Emergency Department (HOSPITAL_COMMUNITY)
Admission: EM | Admit: 2010-11-04 | Discharge: 2010-11-04 | Disposition: A | Discharge status: Home / Self Care | Payer: Self-pay | Attending: Emergency Medicine | Admitting: Emergency Medicine

## 2010-11-04 DIAGNOSIS — Z5900 Homelessness unspecified: Secondary | ICD-10-CM | POA: Insufficient documentation

## 2010-11-04 DIAGNOSIS — Z8614 Personal history of Methicillin resistant Staphylococcus aureus infection: Secondary | ICD-10-CM | POA: Insufficient documentation

## 2010-11-04 DIAGNOSIS — IMO0002 Reserved for concepts with insufficient information to code with codable children: Secondary | ICD-10-CM | POA: Insufficient documentation

## 2010-11-04 DIAGNOSIS — I10 Essential (primary) hypertension: Secondary | ICD-10-CM | POA: Insufficient documentation

## 2010-11-04 DIAGNOSIS — M549 Dorsalgia, unspecified: Secondary | ICD-10-CM | POA: Insufficient documentation

## 2010-11-04 DIAGNOSIS — I252 Old myocardial infarction: Secondary | ICD-10-CM | POA: Insufficient documentation

## 2011-01-05 LAB — RAPID URINE DRUG SCREEN, HOSP PERFORMED
Amphetamines: NOT DETECTED
Barbiturates: NOT DETECTED
Benzodiazepines: POSITIVE — AB
Opiates: NOT DETECTED

## 2011-01-05 LAB — URINALYSIS, ROUTINE W REFLEX MICROSCOPIC
Bilirubin Urine: NEGATIVE
Nitrite: NEGATIVE
Protein, ur: NEGATIVE
Urobilinogen, UA: 1

## 2011-01-05 LAB — URINE MICROSCOPIC-ADD ON

## 2011-01-05 LAB — I-STAT 8, (EC8 V) (CONVERTED LAB)
Acid-Base Excess: 1
BUN: 22
Chloride: 106
Potassium: 3.6
pH, Ven: 7.467 — ABNORMAL HIGH

## 2011-01-05 LAB — POCT I-STAT CREATININE
Creatinine, Ser: 1
Operator id: 284141

## 2011-01-05 LAB — ETHANOL: Alcohol, Ethyl (B): 6

## 2011-01-06 LAB — BASIC METABOLIC PANEL
BUN: 17
CO2: 30
Chloride: 103
Glucose, Bld: 106 — ABNORMAL HIGH
Potassium: 3.7
Sodium: 139

## 2011-01-06 LAB — COMPREHENSIVE METABOLIC PANEL
ALT: 15
AST: 20
Albumin: 4
Calcium: 9.6
GFR calc Af Amer: >60
Sodium: 137
Total Protein: 7.5

## 2011-01-06 LAB — LIPID PANEL
Triglycerides: 52
VLDL: 10

## 2011-01-06 LAB — URINE MICROSCOPIC-ADD ON

## 2011-01-06 LAB — DIFFERENTIAL
Eosinophils Relative: 1
Lymphocytes Relative: 19
Lymphs Abs: 2.4
Monocytes Absolute: 0.9

## 2011-01-06 LAB — URINE CULTURE
Colony Count: NO GROWTH
Special Requests: NEGATIVE

## 2011-01-06 LAB — CBC
HCT: 35.3 — ABNORMAL LOW
Hemoglobin: 12.2
MCHC: 34.5
MCHC: 34.6
MCV: 100.8 — ABNORMAL HIGH
RDW: 14.8
RDW: 14.5

## 2011-01-06 LAB — URINALYSIS, ROUTINE W REFLEX MICROSCOPIC
Bilirubin Urine: NEGATIVE
Specific Gravity, Urine: >1.046 — ABNORMAL HIGH
Urobilinogen, UA: 1

## 2011-01-06 LAB — CULTURE, BLOOD (ROUTINE X 2)
Culture: NO GROWTH
Culture: NO GROWTH

## 2011-01-06 LAB — SEDIMENTATION RATE: Sed Rate: 32 — ABNORMAL HIGH

## 2011-01-06 LAB — PREGNANCY, URINE: Preg Test, Ur: NEGATIVE

## 2011-01-06 LAB — RAPID URINE DRUG SCREEN, HOSP PERFORMED
Barbiturates: NOT DETECTED
Opiates: POSITIVE — AB

## 2011-01-13 LAB — PREGNANCY, URINE: Preg Test, Ur: NEGATIVE

## 2011-01-13 LAB — CBC
HCT: 41
MCV: 103 — ABNORMAL HIGH
RBC: 3.98
WBC: 13.8 — ABNORMAL HIGH

## 2011-01-13 LAB — RAPID URINE DRUG SCREEN, HOSP PERFORMED
Amphetamines: NOT DETECTED
Barbiturates: NOT DETECTED
Benzodiazepines: POSITIVE — AB
Cocaine: POSITIVE — AB
Opiates: NOT DETECTED
Tetrahydrocannabinol: POSITIVE — AB

## 2011-01-13 LAB — URINALYSIS, ROUTINE W REFLEX MICROSCOPIC
Leukocytes, UA: NEGATIVE
Nitrite: NEGATIVE
Specific Gravity, Urine: 1.039 — ABNORMAL HIGH
Urobilinogen, UA: 1

## 2011-01-13 LAB — URINE CULTURE: Colony Count: 9000

## 2011-01-13 LAB — URINE MICROSCOPIC-ADD ON

## 2011-01-13 LAB — DIFFERENTIAL
Eosinophils Absolute: 0.4
Eosinophils Relative: 3
Lymphs Abs: 3.1
Monocytes Relative: 5

## 2011-01-13 LAB — POCT I-STAT, CHEM 8
Creatinine, Ser: 0.8
Glucose, Bld: 84
Hemoglobin: 15
TCO2: 25

## 2011-01-16 LAB — URINALYSIS, ROUTINE W REFLEX MICROSCOPIC
Glucose, UA: NEGATIVE mg/dL
Ketones, ur: 15 mg/dL — AB
Protein, ur: NEGATIVE mg/dL

## 2011-01-16 LAB — CBC
HCT: 38.9 % (ref 36.0–46.0)
MCHC: 34.4 g/dL (ref 30.0–36.0)
Platelets: 241 10*3/uL (ref 150–400)
RDW: 13.7 % (ref 11.5–15.5)

## 2011-01-16 LAB — WET PREP, GENITAL: Clue Cells Wet Prep HPF POC: NONE SEEN

## 2011-01-16 LAB — GC/CHLAMYDIA PROBE AMP, GENITAL
Chlamydia, DNA Probe: NEGATIVE
GC Probe Amp, Genital: NEGATIVE

## 2011-01-16 LAB — URINE MICROSCOPIC-ADD ON
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
## 2011-01-19 IMAGING — RF DG FLUORO GUIDE NDL PLC/BX
1 series · 1 of 1 positions shown · non-contrast
Comparison: CT head scan today.

CLINICAL DATA: Migraine since [REDACTED].

FLUORO GUIDED NEEDLE PLACEMENT for diagnostic LP:

[Series 1: run · 1 of 1 slices shown]
[im 1/1]
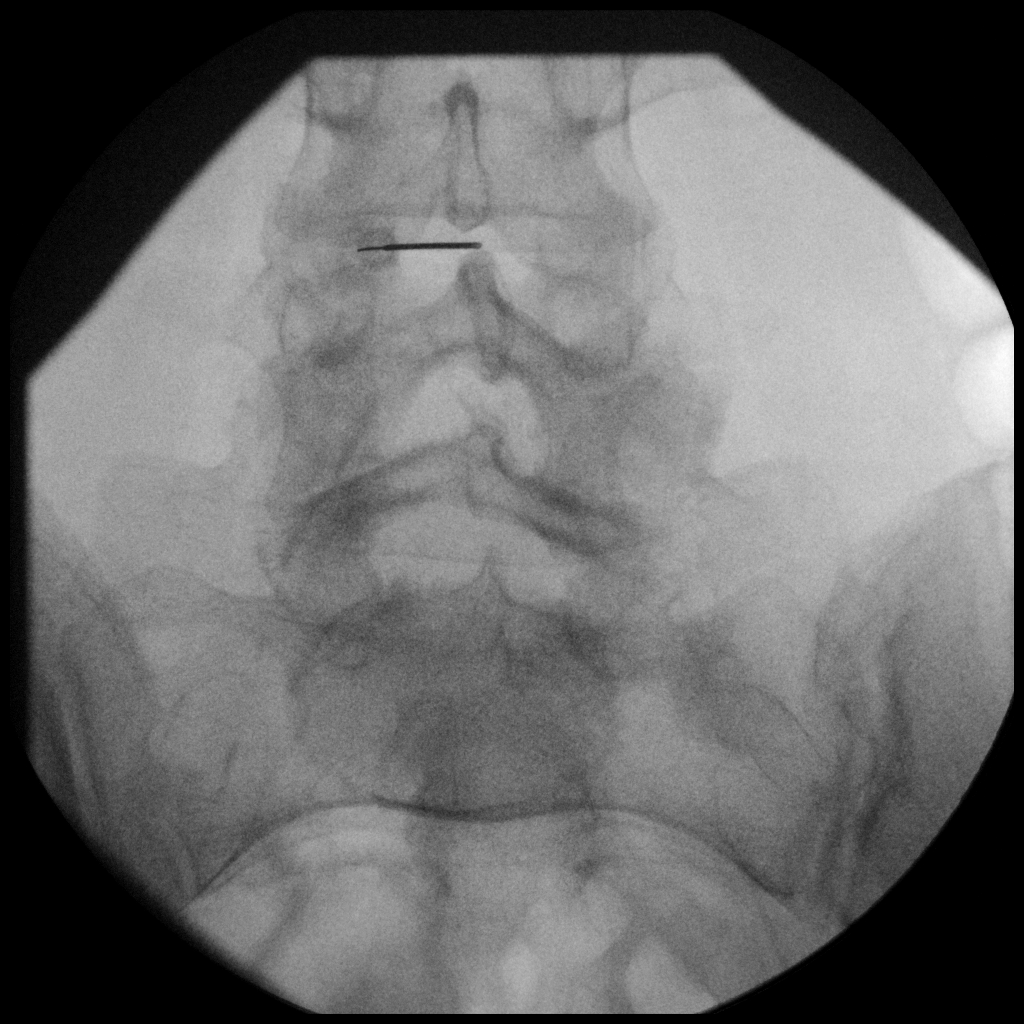

[1 of 1 positions shown; findings below may reference images not displayed]

FINDINGS: Informed consent was obtained.  Time-out procedure was
performed.  I sterilely prepped patients skin with Betadine x3 and
administered local anesthesia with 1% lidocaine.  The 20 gauge
spinal needle was inserted into the subarachnoid space at the L3-4
level.  Unfortunately, the CSF flow reaching the hub of the needle
was very sluggish initially and therefore I did not feel that I
could obtain an adequate opening pressure with the patient in the
neutral prone position.  I was able to obtain approximately 4 ml of
clear colorless CSF with the patient in the steep erect position.
The the patient tolerated the procedure well without immediate
complications.
IMPRESSION: Status post lumbar puncture at the L3-4 level under fluoroscopic
control

## 2011-01-26 LAB — CBC
MCV: 98.5
Platelets: 243
RDW: 14.4 — ABNORMAL HIGH
WBC: 7.8

## 2011-01-26 LAB — WET PREP, GENITAL: Yeast Wet Prep HPF POC: NONE SEEN

## 2011-01-26 LAB — URINALYSIS, ROUTINE W REFLEX MICROSCOPIC
Glucose, UA: NEGATIVE
Ketones, ur: NEGATIVE
Leukocytes, UA: NEGATIVE
Nitrite: NEGATIVE
Protein, ur: NEGATIVE
Urobilinogen, UA: 1

## 2011-01-26 LAB — COMPREHENSIVE METABOLIC PANEL
AST: 18
Albumin: 3.5
Calcium: 8.6
Chloride: 109
Creatinine, Ser: 0.64
GFR calc Af Amer: >60
Total Bilirubin: 0.4
Total Protein: 6.6

## 2011-01-26 LAB — DIFFERENTIAL
Basophils Absolute: 0.1
Basophils Relative: 1
Eosinophils Absolute: 0.4
Eosinophils Relative: 6 — ABNORMAL HIGH
Lymphocytes Relative: 38
Lymphs Abs: 3
Monocytes Absolute: 0.5
Monocytes Relative: 6
Neutro Abs: 3.9

## 2011-01-26 LAB — GC/CHLAMYDIA PROBE AMP, GENITAL: GC Probe Amp, Genital: NEGATIVE

## 2011-01-26 LAB — LIPASE, BLOOD: Lipase: 25

## 2011-02-13 ENCOUNTER — Emergency Department (HOSPITAL_COMMUNITY): Payer: Self-pay

## 2011-02-13 ENCOUNTER — Inpatient Hospital Stay (HOSPITAL_COMMUNITY)
Admission: EM | Admit: 2011-02-13 | Discharge: 2011-02-18 | DRG: 690 | Disposition: A | Discharge status: Home / Self Care | Payer: Self-pay | Attending: Internal Medicine | Admitting: Internal Medicine

## 2011-02-13 DIAGNOSIS — N390 Urinary tract infection, site not specified: Principal | ICD-10-CM | POA: Diagnosis present

## 2011-02-13 DIAGNOSIS — B1920 Unspecified viral hepatitis C without hepatic coma: Secondary | ICD-10-CM | POA: Diagnosis present

## 2011-02-13 DIAGNOSIS — Z720 Tobacco use: Secondary | ICD-10-CM

## 2011-02-13 DIAGNOSIS — F419 Anxiety disorder, unspecified: Secondary | ICD-10-CM | POA: Diagnosis present

## 2011-02-13 DIAGNOSIS — Z8614 Personal history of Methicillin resistant Staphylococcus aureus infection: Secondary | ICD-10-CM

## 2011-02-13 DIAGNOSIS — IMO0002 Reserved for concepts with insufficient information to code with codable children: Secondary | ICD-10-CM | POA: Diagnosis present

## 2011-02-13 DIAGNOSIS — N119 Chronic tubulo-interstitial nephritis, unspecified: Secondary | ICD-10-CM | POA: Diagnosis present

## 2011-02-13 DIAGNOSIS — F1210 Cannabis abuse, uncomplicated: Secondary | ICD-10-CM | POA: Diagnosis present

## 2011-02-13 DIAGNOSIS — E785 Hyperlipidemia, unspecified: Secondary | ICD-10-CM | POA: Diagnosis present

## 2011-02-13 DIAGNOSIS — Z7982 Long term (current) use of aspirin: Secondary | ICD-10-CM

## 2011-02-13 DIAGNOSIS — F1410 Cocaine abuse, uncomplicated: Secondary | ICD-10-CM | POA: Diagnosis present

## 2011-02-13 DIAGNOSIS — F17200 Nicotine dependence, unspecified, uncomplicated: Secondary | ICD-10-CM | POA: Diagnosis present

## 2011-02-13 DIAGNOSIS — M5380 Other specified dorsopathies, site unspecified: Secondary | ICD-10-CM | POA: Diagnosis present

## 2011-02-13 DIAGNOSIS — F1010 Alcohol abuse, uncomplicated: Secondary | ICD-10-CM | POA: Diagnosis present

## 2011-02-13 DIAGNOSIS — A498 Other bacterial infections of unspecified site: Secondary | ICD-10-CM | POA: Diagnosis present

## 2011-02-13 DIAGNOSIS — I10 Essential (primary) hypertension: Secondary | ICD-10-CM | POA: Diagnosis present

## 2011-02-13 DIAGNOSIS — I252 Old myocardial infarction: Secondary | ICD-10-CM

## 2011-02-13 DIAGNOSIS — Z79899 Other long term (current) drug therapy: Secondary | ICD-10-CM

## 2011-02-13 DIAGNOSIS — R0789 Other chest pain: Secondary | ICD-10-CM | POA: Diagnosis present

## 2011-02-13 DIAGNOSIS — I2510 Atherosclerotic heart disease of native coronary artery without angina pectoris: Secondary | ICD-10-CM | POA: Diagnosis present

## 2011-02-13 DIAGNOSIS — F329 Major depressive disorder, single episode, unspecified: Secondary | ICD-10-CM

## 2011-02-13 DIAGNOSIS — R0781 Pleurodynia: Secondary | ICD-10-CM | POA: Diagnosis present

## 2011-02-13 DIAGNOSIS — F411 Generalized anxiety disorder: Secondary | ICD-10-CM | POA: Diagnosis present

## 2011-02-13 DIAGNOSIS — M62830 Muscle spasm of back: Secondary | ICD-10-CM

## 2011-02-13 DIAGNOSIS — F3289 Other specified depressive episodes: Secondary | ICD-10-CM

## 2011-02-13 DIAGNOSIS — G8929 Other chronic pain: Secondary | ICD-10-CM | POA: Diagnosis present

## 2011-02-13 DIAGNOSIS — Z23 Encounter for immunization: Secondary | ICD-10-CM

## 2011-02-13 DIAGNOSIS — F1910 Other psychoactive substance abuse, uncomplicated: Secondary | ICD-10-CM | POA: Diagnosis present

## 2011-02-13 DIAGNOSIS — N281 Cyst of kidney, acquired: Secondary | ICD-10-CM | POA: Diagnosis present

## 2011-02-13 LAB — CBC
HCT: 35.4 % — ABNORMAL LOW (ref 36.0–46.0)
MCHC: 33.9 g/dL (ref 30.0–36.0)
MCV: 98.6 fL (ref 78.0–100.0)
RDW: 13.8 % (ref 11.5–15.5)

## 2011-02-13 LAB — URINALYSIS, ROUTINE W REFLEX MICROSCOPIC
Glucose, UA: NEGATIVE mg/dL
Specific Gravity, Urine: 1.011 (ref 1.005–1.030)
pH: 6 (ref 5.0–8.0)

## 2011-02-13 LAB — URINE MICROSCOPIC-ADD ON

## 2011-02-13 LAB — DIFFERENTIAL
Basophils Absolute: 0 10*3/uL (ref 0.0–0.1)
Eosinophils Relative: 6 % — ABNORMAL HIGH (ref 0–5)
Lymphocytes Relative: 33 % (ref 12–46)
Monocytes Absolute: 0.7 10*3/uL (ref 0.1–1.0)

## 2011-02-13 LAB — POCT I-STAT TROPONIN I: Troponin i, poc: 0.01 ng/mL (ref 0.00–0.08)

## 2011-02-14 ENCOUNTER — Encounter: Payer: Self-pay | Admitting: Internal Medicine

## 2011-02-14 ENCOUNTER — Inpatient Hospital Stay (HOSPITAL_COMMUNITY): Payer: Self-pay

## 2011-02-14 DIAGNOSIS — R079 Chest pain, unspecified: Secondary | ICD-10-CM

## 2011-02-14 LAB — LIPID PANEL
LDL Cholesterol: 95 mg/dL (ref 0–99)
Triglycerides: 85 mg/dL (ref <150)

## 2011-02-14 LAB — BASIC METABOLIC PANEL WITH GFR
BUN: 16 mg/dL (ref 6–23)
CO2: 21 meq/L (ref 19–32)
Calcium: 9.2 mg/dL (ref 8.4–10.5)
Chloride: 108 meq/L (ref 96–112)
Creatinine, Ser: 0.66 mg/dL (ref 0.50–1.10)
GFR calc Af Amer: >90 mL/min
GFR calc non Af Amer: >90 mL/min
Glucose, Bld: 89 mg/dL (ref 70–99)
Potassium: 3.7 meq/L (ref 3.5–5.1)
Sodium: 143 meq/L (ref 135–145)

## 2011-02-14 LAB — CK TOTAL AND CKMB (NOT AT ARMC)
CK, MB: 2.4 ng/mL (ref 0.3–4.0)
Relative Index: INVALID (ref 0.0–2.5)
Total CK: 90 U/L (ref 7–177)

## 2011-02-14 LAB — HEPATIC FUNCTION PANEL
AST: 16 U/L (ref 0–37)
Albumin: 3.4 g/dL — ABNORMAL LOW (ref 3.5–5.2)
Alkaline Phosphatase: 103 U/L (ref 39–117)
Total Bilirubin: 0.1 mg/dL — ABNORMAL LOW (ref 0.3–1.2)

## 2011-02-14 LAB — CARDIAC PANEL(CRET KIN+CKTOT+MB+TROPI)
CK, MB: 2.2 ng/mL (ref 0.3–4.0)
Relative Index: INVALID (ref 0.0–2.5)
Total CK: 67 U/L (ref 7–177)
Troponin I: <0.3 ng/mL (ref <0.30)
Troponin I: <0.3 ng/mL (ref <0.30)

## 2011-02-14 LAB — RAPID URINE DRUG SCREEN, HOSP PERFORMED
Amphetamines: NOT DETECTED
Barbiturates: NOT DETECTED
Benzodiazepines: POSITIVE — AB

## 2011-02-14 LAB — MRSA PCR SCREENING: MRSA by PCR: NEGATIVE

## 2011-02-14 LAB — LIPASE, BLOOD: Lipase: 43 U/L (ref 11–59)

## 2011-02-14 LAB — BASIC METABOLIC PANEL
CO2: 24 mEq/L (ref 19–32)
Chloride: 106 mEq/L (ref 96–112)
Creatinine, Ser: 0.61 mg/dL (ref 0.50–1.10)
GFR calc Af Amer: >90 mL/min (ref >90)
Potassium: 4 mEq/L (ref 3.5–5.1)

## 2011-02-14 LAB — CBC
HCT: 38.1 % (ref 36.0–46.0)
MCH: 33.4 pg (ref 26.0–34.0)
MCHC: 33.9 g/dL (ref 30.0–36.0)
RDW: 14 % (ref 11.5–15.5)

## 2011-02-14 MED ORDER — CYCLOBENZAPRINE HCL 10 MG PO TABS
5.0000 mg | ORAL_TABLET | Freq: Three times a day (TID) | ORAL | Status: DC | PRN
  Filled 2011-02-14 (×3): qty 1

## 2011-02-14 MED ORDER — ASPIRIN 81 MG PO CHEW
81.0000 mg | CHEWABLE_TABLET | Freq: Every day | ORAL | Status: DC
  Administered 2011-02-15 – 2011-02-18 (×4): 81 mg via ORAL
  Filled 2011-02-14: qty 1

## 2011-02-14 MED ORDER — ACETAMINOPHEN 325 MG PO TABS: 650.0000 mg | ORAL_TABLET | Freq: Four times a day (QID) | ORAL | Status: DC | PRN

## 2011-02-14 MED ORDER — SIMVASTATIN 40 MG PO TABS
40.0000 mg | ORAL_TABLET | Freq: Every day | ORAL | Status: DC
  Administered 2011-02-15 – 2011-02-17 (×4): 40 mg via ORAL
  Filled 2011-02-14 (×5): qty 1

## 2011-02-14 MED ORDER — NITROGLYCERIN 0.4 MG SL SUBL: 0.4000 mg | SUBLINGUAL_TABLET | SUBLINGUAL | Status: DC | PRN

## 2011-02-14 MED ORDER — MORPHINE SULFATE 2 MG/ML IJ SOLN: 1.0000 mg | INTRAMUSCULAR | Status: DC | PRN

## 2011-02-14 MED ORDER — LORAZEPAM 2 MG/ML IJ SOLN
1.0000 mg | Freq: Four times a day (QID) | INTRAMUSCULAR | Status: AC | PRN
  Administered 2011-02-16 (×2): 1 mg via INTRAVENOUS
  Filled 2011-02-14: qty 1

## 2011-02-14 MED ORDER — VITAMIN B-1 100 MG PO TABS
100.0000 mg | ORAL_TABLET | Freq: Every day | ORAL | Status: DC
  Administered 2011-02-15 – 2011-02-18 (×4): 100 mg via ORAL
  Filled 2011-02-14 (×5): qty 1

## 2011-02-14 MED ORDER — LISINOPRIL 20 MG PO TABS
20.0000 mg | ORAL_TABLET | Freq: Every day | ORAL | Status: DC
  Administered 2011-02-15 – 2011-02-18 (×4): 20 mg via ORAL
  Filled 2011-02-14 (×5): qty 1

## 2011-02-14 MED ORDER — THERA M PLUS PO TABS
1.0000 | ORAL_TABLET | Freq: Every day | ORAL | Status: DC
  Administered 2011-02-15 – 2011-02-18 (×4): 1 via ORAL
  Filled 2011-02-14 (×5): qty 1

## 2011-02-14 MED ORDER — GABAPENTIN 300 MG PO CAPS
300.0000 mg | ORAL_CAPSULE | Freq: Three times a day (TID) | ORAL | Status: DC
  Administered 2011-02-14 – 2011-02-18 (×12): 300 mg via ORAL
  Filled 2011-02-14 (×15): qty 1

## 2011-02-14 MED ORDER — FOLIC ACID 1 MG PO TABS
1.0000 mg | ORAL_TABLET | Freq: Every day | ORAL | Status: DC
  Administered 2011-02-15 – 2011-02-18 (×4): 1 mg via ORAL
  Filled 2011-02-14 (×5): qty 1

## 2011-02-14 MED ORDER — HEPARIN SODIUM (PORCINE) 5000 UNIT/ML IJ SOLN
5000.0000 [IU] | Freq: Three times a day (TID) | INTRAMUSCULAR | Status: DC
  Administered 2011-02-14 – 2011-02-18 (×10): 5000 [IU] via SUBCUTANEOUS
  Filled 2011-02-14 (×17): qty 1

## 2011-02-14 MED ORDER — DEXTROSE 5 % IV SOLN
1.0000 g | INTRAVENOUS | Status: DC
  Administered 2011-02-15 – 2011-02-16 (×2): 1 g via INTRAVENOUS
  Filled 2011-02-14 (×2): qty 10

## 2011-02-14 MED ORDER — LORAZEPAM 1 MG PO TABS
1.0000 mg | ORAL_TABLET | Freq: Four times a day (QID) | ORAL | Status: AC | PRN
  Administered 2011-02-15: 1 mg via ORAL
  Filled 2011-02-14: qty 1

## 2011-02-14 MED ORDER — SODIUM CHLORIDE 0.9 % IJ SOLN
3.0000 mL | Freq: Two times a day (BID) | INTRAMUSCULAR | Status: DC
  Administered 2011-02-14 – 2011-02-18 (×8): 3 mL via INTRAVENOUS

## 2011-02-14 MED ORDER — LORAZEPAM 1 MG PO TABS
0.0000 mg | ORAL_TABLET | Freq: Four times a day (QID) | ORAL | Status: AC
  Administered 2011-02-15 (×2): 1 mg via ORAL
  Administered 2011-02-15: 2 mg via ORAL

## 2011-02-14 MED ORDER — LORAZEPAM 1 MG PO TABS
0.0000 mg | ORAL_TABLET | Freq: Two times a day (BID) | ORAL | Status: AC
  Administered 2011-02-16 – 2011-02-17 (×2): 4 mg via ORAL
  Filled 2011-02-14: qty 4

## 2011-02-14 MED ORDER — KETOROLAC TROMETHAMINE 30 MG/ML IJ SOLN
30.0000 mg | Freq: Four times a day (QID) | INTRAMUSCULAR | Status: DC | PRN
  Administered 2011-02-15 – 2011-02-18 (×8): 30 mg via INTRAVENOUS
  Filled 2011-02-14 (×8): qty 1

## 2011-02-14 MED ORDER — HYDROCHLOROTHIAZIDE 25 MG PO TABS
25.0000 mg | ORAL_TABLET | Freq: Every day | ORAL | Status: DC
  Administered 2011-02-15 – 2011-02-18 (×4): 25 mg via ORAL
  Filled 2011-02-14 (×5): qty 1

## 2011-02-14 MED ORDER — TRAMADOL HCL 50 MG PO TABS
50.0000 mg | ORAL_TABLET | ORAL | Status: DC | PRN
Start: 2011-02-14 — End: 2011-02-18
  Administered 2011-02-15 – 2011-02-18 (×6): 50 mg via ORAL
  Filled 2011-02-14 (×3): qty 1
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
## 2011-02-15 DIAGNOSIS — F1910 Other psychoactive substance abuse, uncomplicated: Secondary | ICD-10-CM | POA: Diagnosis present

## 2011-02-15 DIAGNOSIS — R079 Chest pain, unspecified: Secondary | ICD-10-CM

## 2011-02-15 DIAGNOSIS — R0781 Pleurodynia: Secondary | ICD-10-CM | POA: Diagnosis present

## 2011-02-15 DIAGNOSIS — M62830 Muscle spasm of back: Secondary | ICD-10-CM | POA: Diagnosis present

## 2011-02-15 DIAGNOSIS — N119 Chronic tubulo-interstitial nephritis, unspecified: Secondary | ICD-10-CM | POA: Diagnosis present

## 2011-02-15 LAB — HEPATITIS PANEL, ACUTE
HCV Ab: REACTIVE — AB
Hep A IgM: NEGATIVE
Hep B C IgM: NEGATIVE
Hepatitis B Surface Ag: NEGATIVE

## 2011-02-15 MED ORDER — CYCLOBENZAPRINE HCL 5 MG PO TABS
5.0000 mg | ORAL_TABLET | Freq: Three times a day (TID) | ORAL | Status: DC
  Administered 2011-02-15 – 2011-02-18 (×11): 5 mg via ORAL
  Filled 2011-02-15: qty 0.5
  Filled 2011-02-15: qty 1
  Filled 2011-02-15: qty 0.5
  Filled 2011-02-15: qty 1
  Filled 2011-02-15: qty 0.5
  Filled 2011-02-15: qty 1
  Filled 2011-02-15: qty 0.5
  Filled 2011-02-15 (×2): qty 1
  Filled 2011-02-15 (×2): qty 0.5
  Filled 2011-02-15: qty 1

## 2011-02-15 MED ORDER — NICOTINE 21 MG/24HR TD PT24
21.0000 mg | MEDICATED_PATCH | Freq: Every day | TRANSDERMAL | Status: DC
  Administered 2011-02-15 – 2011-02-18 (×4): 21 mg via TRANSDERMAL
  Filled 2011-02-15 (×4): qty 1
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
## 2011-02-15 NOTE — Progress Notes (Signed)
02-15-2011 06:37,     Went to empty urine and notice the smell of cigarettes if b.r. Pt. Did adm. She had smoked in the b.r. and would like a nicotine patch. Marland Kitchen

## 2011-02-16 ENCOUNTER — Inpatient Hospital Stay (HOSPITAL_COMMUNITY): Payer: Self-pay

## 2011-02-16 DIAGNOSIS — R079 Chest pain, unspecified: Secondary | ICD-10-CM

## 2011-02-16 LAB — URINALYSIS, ROUTINE W REFLEX MICROSCOPIC
Bilirubin Urine: NEGATIVE
Ketones, ur: NEGATIVE mg/dL
Protein, ur: NEGATIVE mg/dL
Urobilinogen, UA: 0.2 mg/dL (ref 0.0–1.0)

## 2011-02-16 LAB — URINE CULTURE
Colony Count: 15000
Culture  Setup Time: 201211040111

## 2011-02-16 LAB — URINE MICROSCOPIC-ADD ON

## 2011-02-16 MED ORDER — MORPHINE SULFATE 2 MG/ML IJ SOLN
1.0000 mg | Freq: Four times a day (QID) | INTRAMUSCULAR | Status: DC | PRN
  Administered 2011-02-16 – 2011-02-18 (×6): 1 mg via INTRAVENOUS
  Filled 2011-02-16 (×7): qty 1

## 2011-02-16 MED ORDER — CITALOPRAM HYDROBROMIDE 20 MG PO TABS
20.0000 mg | ORAL_TABLET | Freq: Every day | ORAL | Status: DC
  Administered 2011-02-16 – 2011-02-18 (×3): 20 mg via ORAL
  Filled 2011-02-16 (×4): qty 1

## 2011-02-16 MED ORDER — SODIUM CHLORIDE 0.9 % IV SOLN
1.0000 g | INTRAVENOUS | Status: DC
  Administered 2011-02-16 – 2011-02-18 (×3): 1 g via INTRAVENOUS
  Filled 2011-02-16 (×3): qty 1

## 2011-02-16 NOTE — Progress Notes (Signed)
Internal Medicine Attending  Date: 02/16/2011  Patient name: Brenda [name] Medical record number: [id number] Date of birth: 16-Sep-1960 Age: 50 y.o. Gender: female  I saw and evaluated the patient and discussed her care with house staff. I reviewed the resident's note by Dr. Abner Greenspan and I agree with the resident's findings and plan as documented in his note.

## 2011-02-16 NOTE — Plan of Care (Signed)
Problem: Phase II Progression Outcomes Goal: Hemodynamically stable Vitals stable
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
## 2011-02-17 LAB — CBC
HCT: 40.5 % (ref 36.0–46.0)
Hemoglobin: 13.8 g/dL (ref 12.0–15.0)
MCV: 98.1 fL (ref 78.0–100.0)
Platelets: 233 10*3/uL (ref 150–400)
RBC: 4.13 MIL/uL (ref 3.87–5.11)
WBC: 9 10*3/uL (ref 4.0–10.5)

## 2011-02-17 MED ORDER — LORAZEPAM 0.5 MG PO TABS
0.5000 mg | ORAL_TABLET | Freq: Once | ORAL | Status: AC
  Administered 2011-02-17: 0.5 mg via ORAL
  Filled 2011-02-17: qty 1
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
## 2011-02-17 NOTE — Progress Notes (Signed)
Internal Medicine Attending  Date: 02/17/2011  Patient name: Brenda [name] Medical record number: [id number] Date of birth: [birth year] Age: 50 y.o. Gender: female  I saw and evaluated the patient. I reviewed the resident's note by Dr. Abner Greenspan and I agree with the resident's findings and plans as documented in his note.  Patient reports some improvement in her right flank pain, and she is less tender on abdominal exam on the right side.  Plan is to continue IV ertapenem for UTI; anticipate possible discharge home on oral nitrofurantoin after tomorrow's dose of ertapenem if she continues to improve.

## 2011-02-18 MED ORDER — NICOTINE 21 MG/24HR TD PT24: 1.0000 | MEDICATED_PATCH | Freq: Every day | TRANSDERMAL | Status: AC

## 2011-02-18 MED ORDER — NITROGLYCERIN 0.4 MG SL SUBL: 0.4000 mg | SUBLINGUAL_TABLET | SUBLINGUAL | Status: DC | PRN

## 2011-02-18 MED ORDER — CLONAZEPAM 0.5 MG PO TABS: 0.5000 mg | ORAL_TABLET | Freq: Two times a day (BID) | ORAL | Status: DC | PRN

## 2011-02-18 MED ORDER — HYDROCHLOROTHIAZIDE 25 MG PO TABS: 25.0000 mg | ORAL_TABLET | Freq: Every day | ORAL | Status: DC

## 2011-02-18 MED ORDER — FOLIC ACID 1 MG PO TABS: 1.0000 mg | ORAL_TABLET | Freq: Every day | ORAL | Status: DC

## 2011-02-18 MED ORDER — GABAPENTIN 300 MG PO CAPS: 300.0000 mg | ORAL_CAPSULE | Freq: Three times a day (TID) | ORAL | Status: DC

## 2011-02-18 MED ORDER — ASPIRIN 81 MG PO TBEC: 81.0000 mg | DELAYED_RELEASE_TABLET | Freq: Every day | ORAL | Status: DC

## 2011-02-18 MED ORDER — AMLODIPINE BESYLATE 2.5 MG PO TABS: 2.5000 mg | ORAL_TABLET | Freq: Every day | ORAL | Status: DC

## 2011-02-18 MED ORDER — INFLUENZA VIRUS VACC SPLIT PF IM SUSP
0.5000 mL | Freq: Once | INTRAMUSCULAR | Status: AC
  Administered 2011-02-18: 0.5 mL via INTRAMUSCULAR
  Filled 2011-02-18: qty 0.5

## 2011-02-18 MED ORDER — CITALOPRAM HYDROBROMIDE 20 MG PO TABS: 20.0000 mg | ORAL_TABLET | Freq: Every day | ORAL | Status: DC

## 2011-02-18 MED ORDER — LISINOPRIL 20 MG PO TABS: 20.0000 mg | ORAL_TABLET | Freq: Every day | ORAL | Status: DC

## 2011-02-18 MED ORDER — SIMVASTATIN 40 MG PO TABS: 40.0000 mg | ORAL_TABLET | Freq: Every day | ORAL | Status: DC

## 2011-02-18 MED ORDER — CYCLOBENZAPRINE HCL 5 MG PO TABS: 5.0000 mg | ORAL_TABLET | Freq: Two times a day (BID) | ORAL | Status: AC | PRN

## 2011-02-18 MED ORDER — NITROFURANTOIN MONOHYD MACRO 100 MG PO CAPS: 100.0000 mg | ORAL_CAPSULE | Freq: Two times a day (BID) | ORAL | Status: AC
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
## 2011-02-18 NOTE — Discharge Summary (Signed)
Brenda [name], Brenda [name] NO.:  [id number]  MEDICAL RECORD NO.:  [id number]  LOCATION:  2020                         FACILITY:  [facility]  PHYSICIAN:  Ileana Roup, M.D.  DATE OF BIRTH:  December 01, 1960  DATE OF ADMISSION:  02/13/2011 DATE OF DISCHARGE:  02/18/2011                              DISCHARGE SUMMARY   DISCHARGE DIAGNOSES: 1. Escherichia coli bacteriuria,confirmed Extended Spectrum Beta-Lactamase Producer. 2. Right flank pain, possibly due to urinary tract infection. 3. Positive hepatitis C antibody. 4. Hypertension. 5. Chest pain, history of non-ST elevation myocardial infarction. 6. History of substance abuse. 7. Depression.  DISCHARGE MEDICATIONS: 1. Amlodipine 2.5 mg p.o. daily. 2. Aspirin 81 mg p.o. daily. 3. Celexa 20 mg p.o. daily. 4. Clonazepam 0.5 mg p.o. p.r.n. anxiety. 5. Flexeril 5 mg p.o. b.i.d. p.r.n. back spasm. 6. Folic acid 1 mg p.o. daily. 7. Gabapentin 300 mg t.i.d. 8. Hydrochlorothiazide 25 mg p.o. daily. 9. Lisinopril 20 mg p.o. daily. 10.Nicotine patch, starting at 21 mg for 6 weeks. 11.Macrobid 100 mg b.i.d. for 7 days. 12.Nitroglycerin 0.4 mg sublingual p.r.n. chest pain. 13.Simvastatin 40 mg p.o. daily.  DISPOSITION AND FOLLOWUP:  [facility] with Dr. Baltazar Apo. At this point, the results of her hepatitis C RNA test should be reviewed, which should have returned.  We would not recommend narcotic pain medications for pain management,given the previously documented decision not to prescribe further narcotic medications in the outpatient clinic .  We did start her on b.i.d. clonazepam, which had been given to her in the past.  PROCEDURES PERFORMED:  CT scan of the abdomen, Renal U/S  CONSULTATIONS:  None.  BRIEF ADMITTING HISTORY AND PHYSICAL:  The patient is a 50 year old woman with a history of hypertension, hyperlipidemia, and N-STEMI as well as depression and substance abuse, who presented with chest pain and  right flank pain.  The patient had had similar symptoms in August 2011, with T-wave inversions in it but had a negative Myoview at that time.  Per the patient, the chest pain started 3 days ago, was in the middle of her chest, 7/10 in severity, constant, pressure like, exertional radiating to her left arm.  The patient took 3 nitroglycerin without relief.  It remained the same and was aggravated by stress from her roommate and alleviated by Xanax.  It was associated with diaphoresis and nonpleuritic chest pain.  The patient denies cough, shortness of breath,  palpitations.  The patient also complains of right flank pain for 3 days that was constant 9/10 in severity radiating to her right side front of abdominal area.  It was aggravated by movement and alleviated by resting on the bed.  It was associated with nausea and vomiting.  She also had increased urinary frequency but no burning, dysuria or hematuria.  She denies fever, chills, or any change in her bowel movements.  PHYSICAL EXAMINATION:  VITAL SIGNS:  Temperature 97.8, heart rate 91, BP 130/77, respiratory rate 15, O2 saturation 100% on room air. CARDIAC:  Normal S1, S2.  Regular rate and rhythm.  No murmurs, rubs, or gallops.  PULMONARY:  Clear to auscultation bilaterally. ABDOMEN:  Soft.  Mild tenderness over the  right middle quadrant.  No rebound.  Nondistended.  Positive for right CVA tenderness. NEURO:  A and O x3.  LABORATORY DATA:  Her point-of-care troponin was 0.1.  White blood cells 11.7, hemoglobin 12.0, hematocrit 35.4, platelets 236.  Urinalysis was significant for many bacteria but 0-2 white blood cells, and 0-2 red cells per high-powered field.  She was nitrite positive, leukocyte trace.  Chemistry sodium 143, potassium 3.7, chloride 108, CO2 21, BUN 16, creatinine 0.66, glucose 89.  HOSPITAL COURSE BY PROBLEM: 1. Right flank pain.  This patient presented with bacteria in her     urine and a history of  pyelonephritis.  The initial history of mild     elevation in white count, nausea, and vomiting was concerning     again for pyelonephritis.  The patient was initially started on     Ceftriaxone and received a urine culture.  The culture grew out E.     coli that was a confirmed [employer].       It was unclear whether patient's right flank pain was due to urinary tract infection, given the absence of other findings indicative of pyelonephritis.  However, the patient continued to complain of pain and repeatedly asked for narcotic pain medicines.  A CT scan of the abdomen and pelvis on November 5 showed no renal, ureteral, or bladder calculi; no hydronephrosis; and a 1.3 cm left renal cyst.  We decided to treat presumptively for symptomatic urinary tract infection,and switched to ertapenem for 3 days and then discharged patient with a course of nitrofurantoin to complete the course of therapy.  Her pain had improved by discharge. 2. Chest pain.  Given this patient's history of NSTEMI and reports     of chest pain on  admission, she was admitted for chest pain rule     out.  She did rule out with negative troponins and normal EKG and a     resolution of symptoms.  Her prior MI was associated with cocaine     use, and she did not have cocaine positivity on UDS this time     around. 3. History of drug abuse.  This patient repeatedly requested specific narcotics throughout the duration of her hospitalization.  She has had     multiple violations of her pain contract documented in the outpatient clinic.  We prescribed minimal narcotics to her during     her hospitalization.  The patient did have a urine drug screen on     admission that was positive for benzodiazepines and opiates.  Given the previously documented decision not to prescribe further narcotic medications in the outpatient clinic, we were unwilling to discharge patient home on narcotic pain medications.   4.  Hepatitis C.  The patient tested positive for hepatitis C antibody     with RNA test pending.  This RNA test should be followed up at her     next appointment. 5. Depression and anxiety.  This patient seems to have multiple     psychiatric problems including history of drug abuse, depression     for which she is on Celexa and anxiety for which she is on Klonopin     and she may benefit from further psychiatric counseling and/or     psychiatric referral. 6. Chronic back pain.  The patient was given Flexeril and Neurontin     for her chronic back pain.  Given the previously documented decision not to prescribe further narcotic medications in  the outpatient clinic, we were unwilling to discharge patient home on narcotic pain medications.  Referral to a pain clinic should be considered on follow up. 7. Hypertension.  She was continued on lisinopril and     hydrochlorothiazide without incident.  DISCHARGE LABORATORY DATA AND VITALS:  Vital signs at the time of discharge were blood pressure 135/83, pulse 89, temperature 97.8, respiratory rate 19, oxygen saturations 98% on room air.  There were no laboratory evaluations on the day of discharge.    ______________________________ Kathreen Cosier, MD   ______________________________ Ileana Roup, M.D.    BW/MEDQ  D:  02/18/2011  T:  02/18/2011  Job:  [id number]  cc:   Dr. Baltazar Apo

## 2011-02-19 LAB — HCV RNA QUANT RFLX ULTRA OR GENOTYP

## 2011-03-11 ENCOUNTER — Emergency Department (HOSPITAL_COMMUNITY)
Admission: EM | Admit: 2011-03-11 | Discharge: 2011-03-11 | Disposition: A | Discharge status: Home / Self Care | Payer: Self-pay

## 2011-03-12 ENCOUNTER — Encounter: Payer: Self-pay | Admitting: Internal Medicine

## 2011-03-12 ENCOUNTER — Ambulatory Visit (INDEPENDENT_AMBULATORY_CARE_PROVIDER_SITE_OTHER): Payer: Self-pay | Admitting: Internal Medicine

## 2011-03-12 DIAGNOSIS — M549 Dorsalgia, unspecified: Secondary | ICD-10-CM

## 2011-03-12 DIAGNOSIS — G8929 Other chronic pain: Secondary | ICD-10-CM

## 2011-03-12 DIAGNOSIS — F1910 Other psychoactive substance abuse, uncomplicated: Secondary | ICD-10-CM

## 2011-03-12 DIAGNOSIS — I10 Essential (primary) hypertension: Secondary | ICD-10-CM

## 2011-03-12 MED ORDER — CLONAZEPAM 0.5 MG PO TABS: 0.5000 mg | ORAL_TABLET | Freq: Two times a day (BID) | ORAL | Status: DC | PRN

## 2011-03-12 NOTE — Assessment & Plan Note (Signed)
Lab Results  Component Value Date   NA 139 02/14/2011   K 4.0 02/14/2011   CL 106 02/14/2011   CO2 24 02/14/2011   BUN 18 02/14/2011   CREATININE 0.61 02/14/2011    BP Readings from Last 3 Encounters:  03/12/11 150/89  02/18/11 129/72  09/23/10 177/94    Assessment: Hypertension control:  mildly elevated  Progress toward goals:  unchanged Barriers to meeting goals:  low back pain severe today  Plan: Hypertension treatment:  continue current medications

## 2011-03-12 NOTE — Patient Instructions (Signed)
Please take all medications as directed and follow up as needed.

## 2011-03-12 NOTE — Assessment & Plan Note (Signed)
Most notable for alcohol abuse. Patient states that she will be entering a rehabilitation program in January 2013. She admits to continuing her drinking habit until then. She was counseled to abstain or cut down on her alcohol consumption however she states that she does this to help her pain. She did not appear intoxicated nor does she appear to be withdrawing from alcohol. She was encouraged to continue with her vitamins and folic acid supplements.

## 2011-03-12 NOTE — Assessment & Plan Note (Signed)
Chronic back pain secondary to facet arthropathy. Patient has been evaluated by Dr. Darrick Penna as well as [facility] was determined the patient is not a surgical candidate. Patient has been on opiate therapy in the past for pain. Due to multiple dilations a pain contract she is no longer prescribe opioid medications. She now drinks a pint of alcohol to help relieve pain. Once again alternative pain medications were offered to patient including tramadol, NSAIDs as well as muscle relaxant agents. Patient states that she has tried all of these without significant relief. She is quite distressed and tearful on exam today.  I will refer her to pain management clinic. A referral will be made in pain management clinic will determine if she is appropriate candidate.

## 2011-03-12 NOTE — Progress Notes (Signed)
  Subjective:    Patient ID: Brenda [name], female    DOB: 1960-05-13, 50 y.o.   MRN: [id number]  HPI [name] 50 year old woman with past medical history significant for hypertension, atypical chest pain, hyperlipidemia and chronic pain syndrome with extensive use who presents today for hospital followup. Patient was admitted early November for presumed pyelonephritis. She was treated with antibiotics. Patient returns today and states that she has no urinary complaints or concerns. However she does continue to complain of chronic back pain in her lumbar spine as well as bouts of chest pain. She is extremely tearful while obtaining history and she believes that her urine was tempered with and that her subsequent positive drug screens were inaccurate. She claims that she drinks alcohol to help with her pain. She states that she drinks a pint of alcohol daily.   Review of Systems  All other systems reviewed and are negative.       Objective:   Physical Exam  Constitutional: She appears well-developed.  HENT:  Head: Normocephalic.  Neck: Normal range of motion.  Cardiovascular: Normal rate and regular rhythm.   Pulmonary/Chest: Effort normal and breath sounds normal.  Abdominal: Soft. Bowel sounds are normal.  Musculoskeletal: Normal range of motion.          Assessment & Plan:

## 2011-04-04 IMAGING — CR DG CHEST 2V
2 series · 2 of 2 positions shown · non-contrast
Comparison: 01/04/2008

CLINICAL DATA: Chest pain

CHEST - 2 VIEW

[w chest pa]
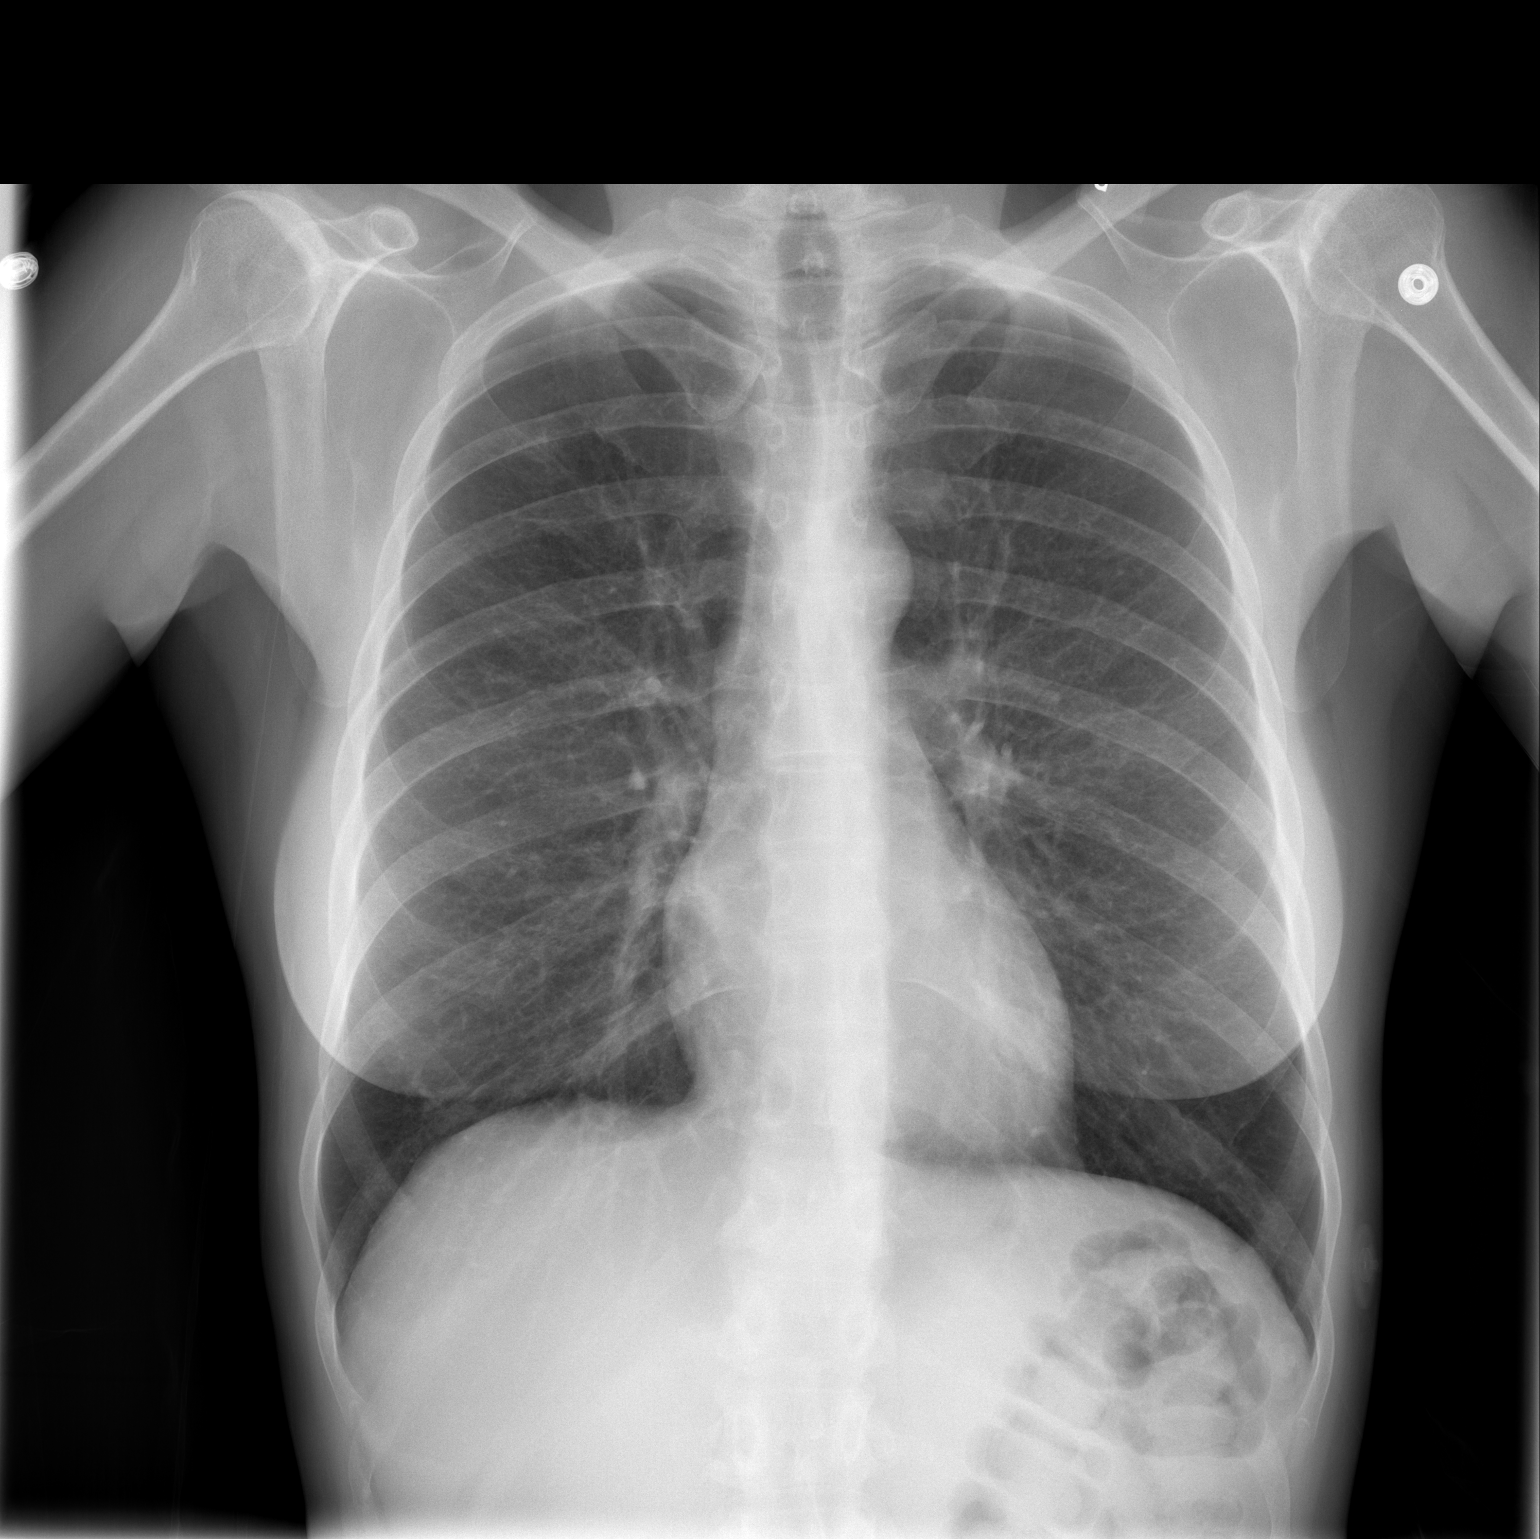

[w chest lat]
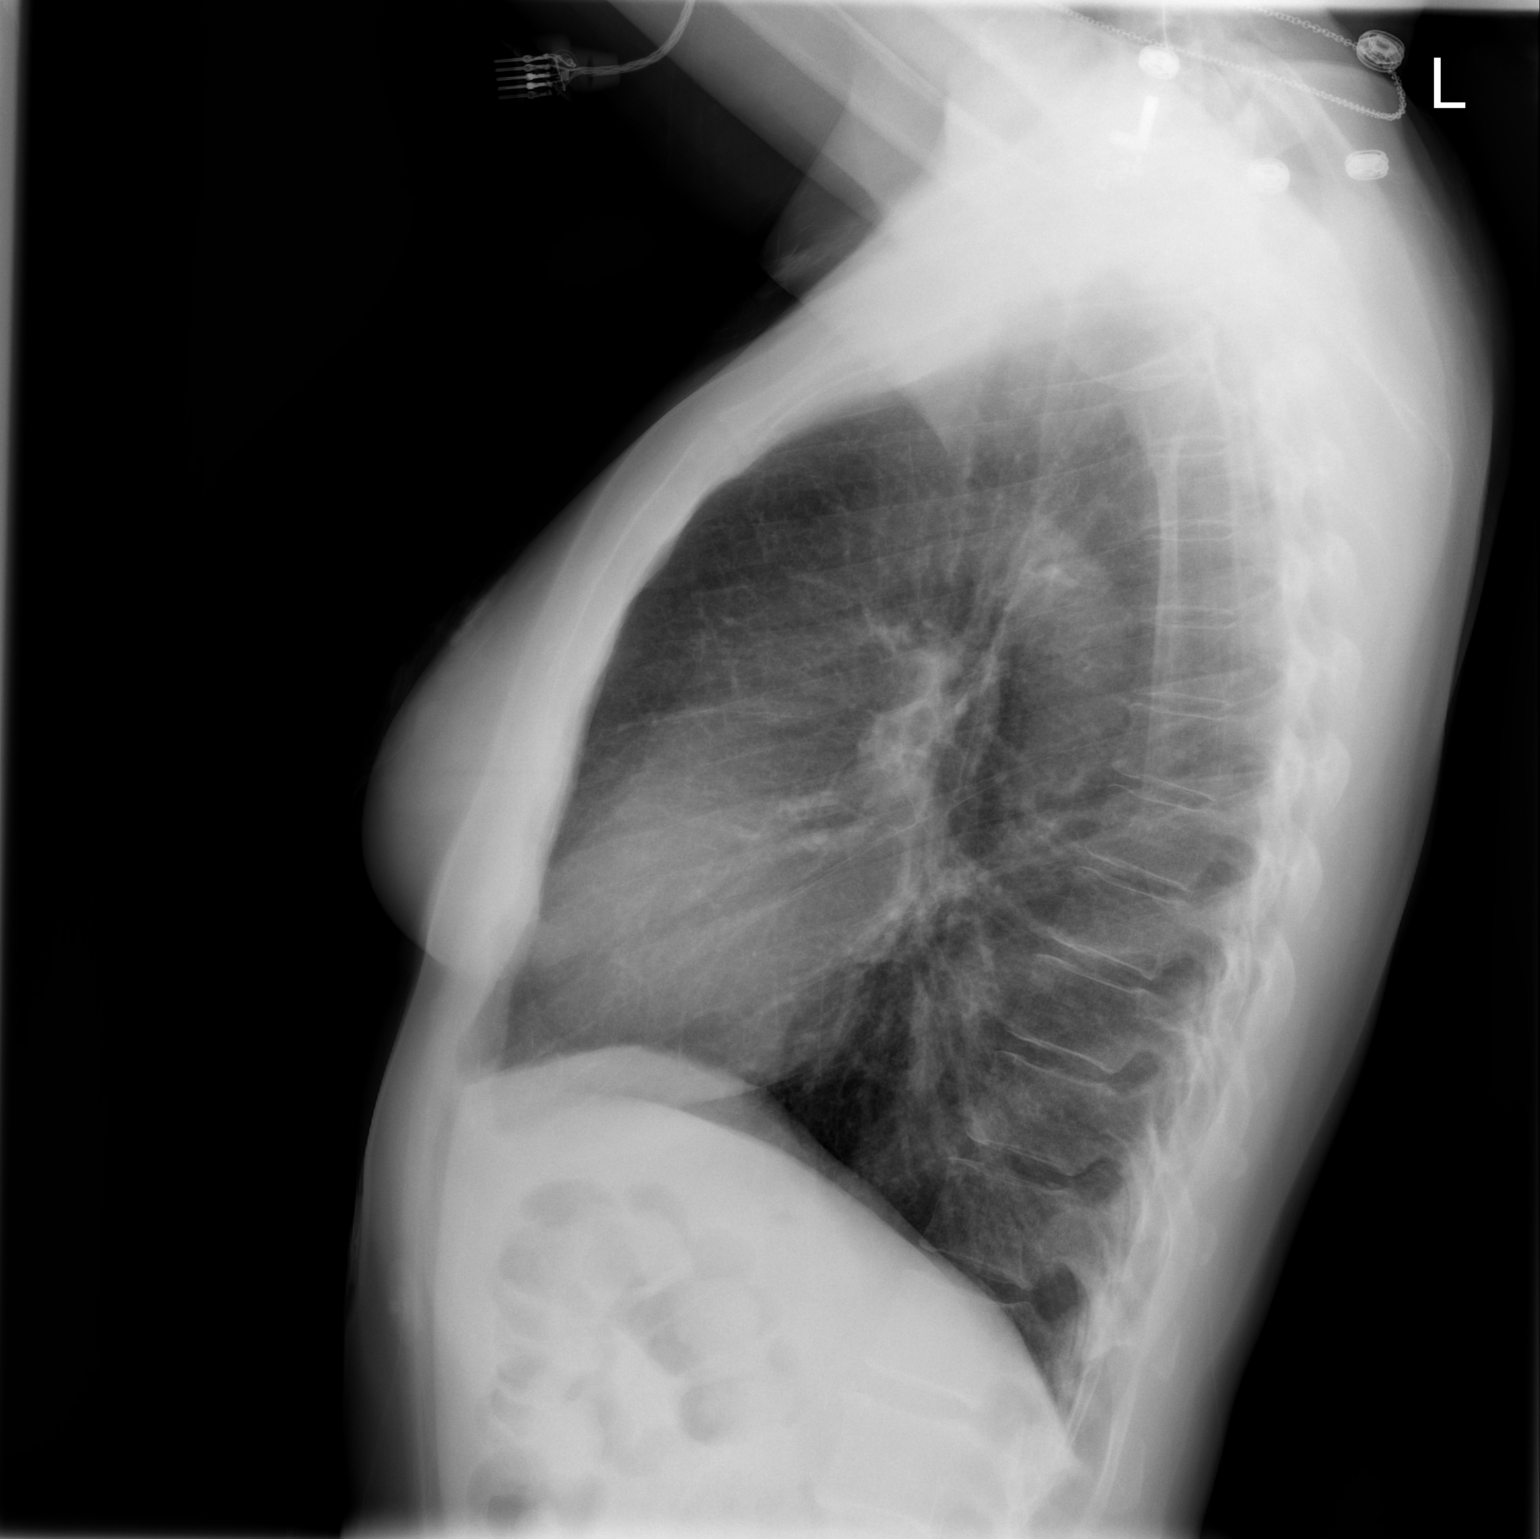

[2 of 2 positions shown; findings below may reference images not displayed]

FINDINGS: Lungs are clear.  Normal heart size.  No pneumothorax.
No pleural effusion.  Mild bronchitic changes and hyperaeration.
IMPRESSION: No active cardiopulmonary disease.  Chronic change.

## 2011-04-20 ENCOUNTER — Telehealth: Payer: Self-pay | Admitting: *Deleted

## 2011-04-20 NOTE — Telephone Encounter (Signed)
Refill request for Clonazepam 0.5 mg tablets # 60.  Take 1 tablet by mouth twice daily as needed for anxiety.  Angelina Ok, RN 04/20/2011 4:07 PM.

## 2011-04-20 NOTE — Telephone Encounter (Signed)
Patient needs an appointment to discuss this

## 2011-04-21 NOTE — Telephone Encounter (Signed)
Decline for medication was faxed to the pharmacy with a note to have pt call the Clinics for an appointment.  Angelina Ok, RN 04/19/2010   3:45PM.

## 2011-04-29 ENCOUNTER — Telehealth: Payer: Self-pay | Admitting: Internal Medicine

## 2011-04-29 NOTE — Telephone Encounter (Signed)
INTERNAL MEDICINE OUTPATIENT CLINIC After-Hours Telephone Call   Reason for call:    I received a call from Ms. KIARAH ECKSTEIN indicating she has been having more low back pain over the last few days, after helping an elderly man carry heavy things. She has had progressive lower back pain that radiates to her right lower extremity as per chronic pain and feels that she has weakness in this leg, but has not had resultant falls. She says her pain is so severe that she has been in tears all day. She does not want to go to the ER for her pain because she does not want to get the flu or other potential infections from hospital exposure. Denies fevers, chills, falls, recent trauma    Last clinic/ hospital encounter:   March 12, 2011 - with Dr. Baltazar Apo in the Clinic.    Summary:    Recommendations: Given severity of pain and weakness described, pt was recommended to go to ER for evaluation if symptoms are severe. However, she states she does not want to go to the ER. I instructed that I cannot prescribe narcotics after hours and over the phone. Therefore, she is recommended to call the clinic in the morning for a same-day appt if available. As always, pt is advised that if symptoms worsen or new symptoms arise, they should go to an urgent care facility or to to ER for further evaluation.   Additional Details for follow-up provider: She has had multiple prior infractions of her pain contract with me (her prior PCP - no longer), admitted to significant alcohol use during her last visit with Dr. Baltazar Apo, and was therefore referred to pain clinic. As the patient was in tears, I did not have detailed discussion about this with her. However, she is no longer prescribed narcotics from our clinic because of prior infractions, and she was previously given multiple chances to comply with clinic opiate policy. Clinical discretion is of course recommended for follow-up provider, but narcotic prescription should be  given with caution as per follow-up provider's decision.      Johnette Abraham, D.O.  04/29/2011, 7:36 PM

## 2011-04-30 ENCOUNTER — Encounter: Payer: Self-pay | Admitting: Internal Medicine

## 2011-04-30 ENCOUNTER — Ambulatory Visit (INDEPENDENT_AMBULATORY_CARE_PROVIDER_SITE_OTHER): Payer: Self-pay | Admitting: Internal Medicine

## 2011-04-30 DIAGNOSIS — F329 Major depressive disorder, single episode, unspecified: Secondary | ICD-10-CM

## 2011-04-30 DIAGNOSIS — M549 Dorsalgia, unspecified: Secondary | ICD-10-CM

## 2011-04-30 DIAGNOSIS — R1012 Left upper quadrant pain: Secondary | ICD-10-CM | POA: Insufficient documentation

## 2011-04-30 DIAGNOSIS — I10 Essential (primary) hypertension: Secondary | ICD-10-CM

## 2011-04-30 DIAGNOSIS — G8929 Other chronic pain: Secondary | ICD-10-CM

## 2011-04-30 LAB — LIPASE: Lipase: 39 U/L (ref 0–75)

## 2011-04-30 MED ORDER — NITROGLYCERIN 0.4 MG SL SUBL: 0.4000 mg | SUBLINGUAL_TABLET | SUBLINGUAL | Status: DC | PRN

## 2011-04-30 MED ORDER — TRAMADOL HCL 50 MG PO TABS: 50.0000 mg | ORAL_TABLET | Freq: Four times a day (QID) | ORAL | Status: DC | PRN

## 2011-04-30 MED ORDER — CITALOPRAM HYDROBROMIDE 20 MG PO TABS: 20.0000 mg | ORAL_TABLET | Freq: Every day | ORAL | Status: DC

## 2011-04-30 MED ORDER — LISINOPRIL 20 MG PO TABS: 20.0000 mg | ORAL_TABLET | Freq: Every day | ORAL | Status: DC

## 2011-04-30 MED ORDER — HYDROCHLOROTHIAZIDE 25 MG PO TABS: 25.0000 mg | ORAL_TABLET | Freq: Every day | ORAL | Status: DC

## 2011-04-30 MED ORDER — CYCLOBENZAPRINE HCL 10 MG PO TABS: 10.0000 mg | ORAL_TABLET | Freq: Three times a day (TID) | ORAL | Status: DC | PRN

## 2011-04-30 MED ORDER — AMLODIPINE BESYLATE 2.5 MG PO TABS: 2.5000 mg | ORAL_TABLET | Freq: Every day | ORAL | Status: DC

## 2011-04-30 MED ORDER — ASPIRIN 81 MG PO TBEC: 81.0000 mg | DELAYED_RELEASE_TABLET | Freq: Every day | ORAL | Status: DC

## 2011-04-30 MED ORDER — SIMVASTATIN 40 MG PO TABS: 40.0000 mg | ORAL_TABLET | Freq: Every day | ORAL | Status: DC

## 2011-04-30 MED ORDER — GABAPENTIN 300 MG PO CAPS: 300.0000 mg | ORAL_CAPSULE | Freq: Three times a day (TID) | ORAL | Status: DC

## 2011-04-30 MED ORDER — FOLIC ACID 1 MG PO TABS: 1.0000 mg | ORAL_TABLET | Freq: Every day | ORAL | Status: DC
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
## 2011-04-30 NOTE — Telephone Encounter (Signed)
appt made for pt today at 1315 dr raisch per chilonb.

## 2011-04-30 NOTE — Assessment & Plan Note (Addendum)
This appears to be an acute exacerbation of her chronic pain. I see no concerning signs, symptoms or findings on history or exam that would make me think this is anything other than her chronic pain. She has had extensive work up, which I reviewed. She has seen a neurosurgeon per Dr. Van Clines last note and deemed not to be a surgical candidate. I explained to her that we cannot give her narcotics and if she is emergent pain to go to the ED. She denies any seizure history or current alcohol abuse, so the worry about tramadol lowering seizure threshold is less, but I did counsel the patient regarding this risk, especially when combined with alcohol.  -- Referral to pain medicine clinic -- tramadol 50  mg PO Q6H PRN disp #20 -- referral to PT -- advised ice and heat packs  I spent over 40 minutes discussing the above findings and opinions with the patient.

## 2011-04-30 NOTE — Patient Instructions (Addendum)
Do not drive or operate machinery while you are taking tramadol.   If any of your lab results are abnormal, we will contact you by phone or send you a letter. If they are normal, we will not contact you, but will be happy to discuss them at your next clinic appointment.  Return to clinic in 1 week to review your lab results.  Please bring all your medications to your next clinic appointment.   Chronic Back Pain When back pain lasts longer than 3 months, it is called chronic back pain.This pain can be frustrating, but the cause of the pain is rarely dangerous.People with chronic back pain often go through certain periods that are more intense (flare-ups). CAUSES Chronic back pain can be caused by wear and tear (degeneration) on different structures in your back. These structures may include bones, ligaments, or discs. This degeneration may result in more pressure being placed on the nerves that travel to your legs and feet. This can lead to pain traveling from the low back down the back of the legs. When pain lasts longer than 3 months, it is not unusual for people to experience anxiety or depression. Anxiety and depression can also contribute to low back pain. TREATMENT  Establish a regular exercise plan. This is critical to improving your functional level.   Have a self-management plan for when you flare-up. Flare-ups rarely require a medical visit. Regular exercise will help reduce the intensity and frequency of your flare-ups.   Manage how you feel about your back pain and the rest of your life. Anxiety, depression, and feeling that you cannot alter your back pain have been shown to make back pain more intense and debilitating.   Medicines should never be your only treatment. They should be used along with other treatments to help you return to a more active lifestyle.   Procedures such as injections or surgery may be helpful but are rarely necessary. You may be able to get the same results  with physical therapy or chiropractic care.  HOME CARE INSTRUCTIONS  Avoid bending, heavy lifting, prolonged sitting, and activities which make the problem worse.   Continue normal activity as much as possible.   Take brief periods of rest throughout the day to reduce your pain during flare-ups.   Follow your back exercise rehabilitation program. This can help reduce symptoms and prevent more pain.   Only take over-the-counter or prescription medicines as directed by your caregiver. Muscle relaxants are sometimes prescribed. Narcotic pain medicine is discouraged for long-term pain, since addiction is a possible outcome.   If you smoke, quit.   Eat healthy foods and maintain a recommended body weight.  SEEK IMMEDIATE MEDICAL CARE IF:   You have weakness or numbness in one of your legs or feet.   You have trouble controlling your bladder or bowels.   You develop nausea, vomiting, abdominal pain, shortness of breath, or fainting.  Document Released: 05/07/2004 Document Revised: 12/10/2010 Document Reviewed: 08/18/2010 [facility] Patient Information 2012 [place], [place].   Abdominal Pain (Nonspecific) Your exam might not show the exact reason you have abdominal pain. Since there are many different causes of abdominal pain, another checkup and more tests may be needed. It is very important to follow up for lasting (persistent) or worsening symptoms. A possible cause of abdominal pain in any person who still has his or her appendix is acute appendicitis. Appendicitis is often hard to diagnose. Normal blood tests, urine tests, ultrasound, and CT scans do not  completely rule out early appendicitis or other causes of abdominal pain. Sometimes, only the changes that happen over time will allow appendicitis and other causes of abdominal pain to be determined. Other potential problems that may require surgery may also take time to become more apparent. Because of this, it is important that you follow  all of the instructions below. HOME CARE INSTRUCTIONS   Rest as much as possible.   Do not eat solid food until your pain is gone.   While adults or children have pain: A diet of water, weak decaffeinated tea, broth or bouillon, gelatin, oral rehydration solutions (ORS), frozen ice pops, or ice chips may be helpful.   When pain is gone in adults or children: Start a light diet (dry toast, crackers, applesauce, or white rice). Increase the diet slowly as long as it does not bother you. Eat no dairy products (including cheese and eggs) and no spicy, fatty, fried, or high-fiber foods.   Use no alcohol, caffeine, or cigarettes.   Take your regular medicines unless your caregiver told you not to.   Take any prescribed medicine as directed.   Only take over-the-counter or prescription medicines for pain, discomfort, or fever as directed by your caregiver. Do not give aspirin to children.  If your caregiver has given you a follow-up appointment, it is very important to keep that appointment. Not keeping the appointment could result in a permanent injury and/or lasting (chronic) pain and/or disability. If there is any problem keeping the appointment, you must call to reschedule.  SEEK IMMEDIATE MEDICAL CARE IF:   Your pain is not gone in 24 hours.   Your pain becomes worse, changes location, or feels different.   You or your child has an oral temperature above 102 F (38.9 C), not controlled by medicine.   Your baby is older than 3 months with a rectal temperature of 102 F (38.9 C) or higher.   Your baby is 25 months old or younger with a rectal temperature of 100.4 F (38 C) or higher.   You have shaking chills.   You keep throwing up (vomiting) or cannot drink liquids.   There is blood in your vomit or you see blood in your bowel movements.   Your bowel movements become dark or black.   You have frequent bowel movements.   Your bowel movements stop (become blocked) or you cannot  pass gas.   You have bloody, frequent, or painful urination.   You have yellow discoloration in the skin or whites of the eyes.   Your stomach becomes bloated or bigger.   You have dizziness or fainting.   You have chest or back pain.  MAKE SURE YOU:   Understand these instructions.   Will watch your condition.   Will get help right away if you are not doing well or get worse.  Document Released: 03/30/2005 Document Revised: 12/10/2010 Document Reviewed: 02/25/2009 [facility] Patient Information 2012 [place], [place].

## 2011-04-30 NOTE — Assessment & Plan Note (Addendum)
Unclear cause at this time. The most likely cause is musculoskeletal given that she describes it as worse with motion and cough. It also is tender over her ribs. She was recently discharged for pyelo, but has no dysuria or flank pain. She describes no nausea, vomiting, cramping or GI symptoms.  She maybe could have pancreatitis or referred pain from her back. -- UA and culture -- CMP -- lipase

## 2011-05-01 LAB — URINALYSIS
Glucose, UA: NEGATIVE mg/dL
Leukocytes, UA: NEGATIVE
Nitrite: NEGATIVE
Protein, ur: NEGATIVE mg/dL
pH: 6 (ref 5.0–8.0)

## 2011-05-01 LAB — COMPLETE METABOLIC PANEL WITH GFR
ALT: 10 U/L (ref 0–35)
AST: 17 U/L (ref 0–37)
Alkaline Phosphatase: 72 U/L (ref 39–117)
Creat: 0.61 mg/dL (ref 0.50–1.10)
Total Bilirubin: 0.3 mg/dL (ref 0.3–1.2)

## 2011-05-01 NOTE — Progress Notes (Signed)
Discussed case and reviewed records with Dr. Candy Sledge during this visit.  I agree with his plans.

## 2011-05-02 LAB — URINE CULTURE: Colony Count: NO GROWTH

## 2011-05-11 ENCOUNTER — Telehealth: Payer: Self-pay | Admitting: *Deleted

## 2011-05-11 ENCOUNTER — Emergency Department (HOSPITAL_COMMUNITY): Payer: Self-pay

## 2011-05-11 ENCOUNTER — Encounter (HOSPITAL_COMMUNITY): Payer: Self-pay

## 2011-05-11 ENCOUNTER — Emergency Department (HOSPITAL_COMMUNITY)
Admission: EM | Admit: 2011-05-11 | Discharge: 2011-05-11 | Disposition: A | Discharge status: Home / Self Care | Payer: Self-pay | Attending: Emergency Medicine | Admitting: Emergency Medicine

## 2011-05-11 DIAGNOSIS — I10 Essential (primary) hypertension: Secondary | ICD-10-CM | POA: Insufficient documentation

## 2011-05-11 DIAGNOSIS — I2510 Atherosclerotic heart disease of native coronary artery without angina pectoris: Secondary | ICD-10-CM | POA: Insufficient documentation

## 2011-05-11 DIAGNOSIS — Z8673 Personal history of transient ischemic attack (TIA), and cerebral infarction without residual deficits: Secondary | ICD-10-CM | POA: Insufficient documentation

## 2011-05-11 DIAGNOSIS — R1012 Left upper quadrant pain: Secondary | ICD-10-CM | POA: Insufficient documentation

## 2011-05-11 DIAGNOSIS — R1013 Epigastric pain: Secondary | ICD-10-CM | POA: Insufficient documentation

## 2011-05-11 DIAGNOSIS — E785 Hyperlipidemia, unspecified: Secondary | ICD-10-CM | POA: Insufficient documentation

## 2011-05-11 DIAGNOSIS — R112 Nausea with vomiting, unspecified: Secondary | ICD-10-CM | POA: Insufficient documentation

## 2011-05-11 LAB — URINALYSIS, ROUTINE W REFLEX MICROSCOPIC
Bilirubin Urine: NEGATIVE
Leukocytes, UA: NEGATIVE
Nitrite: NEGATIVE
Specific Gravity, Urine: 1.01 (ref 1.005–1.030)
Urobilinogen, UA: 0.2 mg/dL (ref 0.0–1.0)
pH: 5.5 (ref 5.0–8.0)

## 2011-05-11 LAB — COMPREHENSIVE METABOLIC PANEL
ALT: 6 U/L (ref 0–35)
Calcium: 9.4 mg/dL (ref 8.4–10.5)
Creatinine, Ser: 0.46 mg/dL — ABNORMAL LOW (ref 0.50–1.10)
GFR calc Af Amer: >90 mL/min (ref >90)
GFR calc non Af Amer: >90 mL/min (ref >90)
Glucose, Bld: 76 mg/dL (ref 70–99)
Sodium: 143 mEq/L (ref 135–145)
Total Protein: 7.6 g/dL (ref 6.0–8.3)

## 2011-05-11 LAB — LIPASE, BLOOD: Lipase: 52 U/L (ref 11–59)

## 2011-05-11 LAB — POCT PREGNANCY, URINE: Preg Test, Ur: NEGATIVE

## 2011-05-11 MED ORDER — HYDROMORPHONE HCL PF 1 MG/ML IJ SOLN
1.0000 mg | Freq: Once | INTRAMUSCULAR | Status: AC
  Administered 2011-05-11: 1 mg via INTRAVENOUS
  Filled 2011-05-11: qty 1

## 2011-05-11 MED ORDER — SODIUM CHLORIDE 0.9 % IV SOLN: INTRAVENOUS | Status: DC

## 2011-05-11 MED ORDER — ONDANSETRON HCL 4 MG/2ML IJ SOLN
4.0000 mg | Freq: Once | INTRAMUSCULAR | Status: AC
  Administered 2011-05-11: 4 mg via INTRAVENOUS
  Filled 2011-05-11: qty 2

## 2011-05-11 MED ORDER — ONDANSETRON HCL 4 MG PO TABS: 4.0000 mg | ORAL_TABLET | Freq: Four times a day (QID) | ORAL | Status: AC | PRN

## 2011-05-11 MED ORDER — LANSOPRAZOLE 30 MG PO CPDR: 30.0000 mg | DELAYED_RELEASE_CAPSULE | Freq: Every day | ORAL | Status: DC

## 2011-05-11 MED ORDER — IOHEXOL 300 MG/ML  SOLN
100.0000 mL | Freq: Once | INTRAMUSCULAR | Status: AC | PRN
  Administered 2011-05-11: 100 mL via INTRAVENOUS

## 2011-05-11 MED ORDER — OXYCODONE-ACETAMINOPHEN 5-325 MG PO TABS: 1.0000 | ORAL_TABLET | ORAL | Status: AC | PRN

## 2011-05-11 NOTE — ED Notes (Signed)
Lt. Side abdominal pain with n/v/ and black stool for 3 weeks.

## 2011-05-11 NOTE — ED Notes (Signed)
Pt completed CT contrast , CT aware.

## 2011-05-11 NOTE — Telephone Encounter (Signed)
Pt feels like she is dehydrated, and her Blood Pressure is elevated.  She feels terrible and will go to ED now for evaluation.

## 2011-05-11 NOTE — ED Notes (Signed)
Patient transported to CT 

## 2011-05-11 NOTE — Telephone Encounter (Signed)
Pt called stating she was seen in clinic on 1/17, she wants test results.  ( all normal) She states her condition is getting worse, loose Bm's, diarrhea, N/Vomiting, pain is spreading to left side of rib cage to back, left side feels bloated and is swelling.  Rates pain 8/10.  Denies fever. 5 BM's today She was told that clinic can only do so many test and if pain is increases she needs to go to ED. We don't have any appointments today in clinic but can see tomorrow.    Please advise  Pt # [id number]

## 2011-05-11 NOTE — ED Notes (Signed)
Report called to [facility] in CDU.  Pt ready to be transported.
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
## 2011-05-11 NOTE — Telephone Encounter (Signed)
Thank you for dealing with this. If she is having vomiting and diarrhea, she is at risk of dehydration and needs to go to the ER like you advised.   In terms of pain, she was Rx tramadol last time and if she has any left over, she can take it. Otherwise, tylenol, IBU. She may be seen tomorrow in the opening that we have but pt needs to understand that we are not able to prescribe opioids to her for her chronic pain syndrome, even during a flare like she was diagnosed with at the last visit.

## 2011-05-11 NOTE — ED Provider Notes (Signed)
Medical screening examination/treatment/procedure(s) were conducted as a shared visit with non-physician practitioner(s) and myself.  I personally evaluated the patient during the encounter  Doug Sou, MD 05/11/11 2337

## 2011-05-11 NOTE — ED Notes (Signed)
PA at bedside.

## 2011-05-11 NOTE — ED Notes (Signed)
MD at bedside. 

## 2011-05-11 NOTE — ED Provider Notes (Signed)
3:27 PM I assumed care of this patient in the CDU as a shared visit with Dr. Ethelda Chick. Pt with several weeks of abd pain, and has been seen for the same by PCP. Heme negative stool here. Awaiting labs and CT abd/pelvis.  7:46 PM Pt's CT and labs unremarkable. Urine sample was apparently discarded by lab; awaiting another sample for dispo. Pt has been requesting to eat.  9:02 PM UA unremarkable for infx. Pt has been able to tolerate crackers and soda here. She remains mildly tender in the epigastrium and LUQ without peritoneal signs. She does state that this seems to get worse about an hour or two after she eats sometimes and is described as a achy sensation "like a toothache." She has been seen by her PCP but has not been started on any meds. Will try her on trial of prevacid. Recommended PCP f/u and considering f/u with GI as well for this. Discussed return precautions. Pt verbalized understanding and agreed to plan.  Grant Fontana, [place] 05/11/11 2148

## 2011-06-08 NOTE — Progress Notes (Signed)
Addended by: Neomia Dear on: 06/08/2011 11:24 AM   Modules accepted: Orders
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
## 2011-10-06 ENCOUNTER — Encounter: Payer: Self-pay | Admitting: Physical Medicine & Rehabilitation

## 2011-10-06 ENCOUNTER — Encounter: Payer: Self-pay | Attending: Physical Medicine & Rehabilitation

## 2011-10-06 ENCOUNTER — Ambulatory Visit (HOSPITAL_BASED_OUTPATIENT_CLINIC_OR_DEPARTMENT_OTHER): Payer: Self-pay | Admitting: Physical Medicine & Rehabilitation

## 2011-10-06 VITALS — BP 213/82 | HR 67 | Resp 16 | Ht 63.0 in | Wt 124.2 lb

## 2011-10-06 DIAGNOSIS — M5410 Radiculopathy, site unspecified: Secondary | ICD-10-CM | POA: Insufficient documentation

## 2011-10-06 DIAGNOSIS — M47819 Spondylosis without myelopathy or radiculopathy, site unspecified: Secondary | ICD-10-CM | POA: Insufficient documentation

## 2011-10-06 DIAGNOSIS — R209 Unspecified disturbances of skin sensation: Secondary | ICD-10-CM | POA: Insufficient documentation

## 2011-10-06 DIAGNOSIS — M549 Dorsalgia, unspecified: Secondary | ICD-10-CM | POA: Insufficient documentation

## 2011-10-06 DIAGNOSIS — M5430 Sciatica, unspecified side: Secondary | ICD-10-CM

## 2011-10-06 DIAGNOSIS — M47817 Spondylosis without myelopathy or radiculopathy, lumbosacral region: Secondary | ICD-10-CM

## 2011-10-06 MED ORDER — METAXALONE 800 MG PO TABS: 800.0000 mg | ORAL_TABLET | Freq: Three times a day (TID) | ORAL | Status: AC | PRN

## 2011-10-06 MED ORDER — MELOXICAM 7.5 MG PO TABS: 15.0000 mg | ORAL_TABLET | Freq: Every day | ORAL | Status: DC
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
## 2011-10-06 NOTE — Patient Instructions (Signed)
We have scheduled an MRI which will be done at [name] and they will call you to give you a time and date. I've started an anti-inflammatory called Mobic once a day take with food I started a muscle relaxer called Skelaxin you'll take this up to 3 times per day I'll see you in about 2 weeks and we will discuss your MRI findings I will send you to physical therapy for a core strengthening exercise program

## 2011-10-07 ENCOUNTER — Telehealth: Payer: Self-pay | Admitting: *Deleted

## 2011-10-07 DIAGNOSIS — Z01818 Encounter for other preprocedural examination: Secondary | ICD-10-CM

## 2011-10-07 NOTE — Telephone Encounter (Signed)
Brenda [name] is scheduled for MRI Monday with contrast, so they need an order for BUN,Creat.  Order placed.

## 2011-10-07 NOTE — Telephone Encounter (Signed)
thanks

## 2011-10-08 ENCOUNTER — Telehealth: Payer: Self-pay | Admitting: Physical Medicine & Rehabilitation

## 2011-10-08 NOTE — Telephone Encounter (Signed)
Patient needs an order for stat lab before her MRI she is having on 10/12/11.  Patient will have lab drawn at the hospital.

## 2011-10-12 ENCOUNTER — Telehealth: Payer: Self-pay | Admitting: *Deleted

## 2011-10-12 ENCOUNTER — Ambulatory Visit (HOSPITAL_COMMUNITY)
Admission: RE | Admit: 2011-10-12 | Discharge: 2011-10-12 | Disposition: A | Discharge status: Home / Self Care | Payer: Self-pay | Source: Ambulatory Visit | Attending: Physical Medicine & Rehabilitation | Admitting: Physical Medicine & Rehabilitation

## 2011-10-12 DIAGNOSIS — M545 Low back pain, unspecified: Secondary | ICD-10-CM | POA: Insufficient documentation

## 2011-10-12 DIAGNOSIS — M5430 Sciatica, unspecified side: Secondary | ICD-10-CM | POA: Insufficient documentation

## 2011-10-12 DIAGNOSIS — M4120 Other idiopathic scoliosis, site unspecified: Secondary | ICD-10-CM | POA: Insufficient documentation

## 2011-10-12 LAB — CREATININE, SERUM
Creatinine, Ser: 0.52 mg/dL (ref 0.50–1.10)
GFR calc non Af Amer: >90 mL/min (ref >90)

## 2011-10-12 MED ORDER — GADOBENATE DIMEGLUMINE 529 MG/ML IV SOLN
12.0000 mL | Freq: Once | INTRAVENOUS | Status: AC
  Administered 2011-10-12: 12 mL via INTRAVENOUS

## 2011-10-12 NOTE — Telephone Encounter (Signed)
Message copied by Caryl Ada on Mon Oct 12, 2011  1:04 PM ------      Message from: Erick Colace      Created: Mon Oct 12, 2011  1:02 PM       Please schedule for R L$ trans LESI no kit, may add on 7/5 am schedule

## 2011-10-12 NOTE — Telephone Encounter (Signed)
LM on personal voicemail letting her know that Dr. Wynn Banker recommends an epidural injection. Advised her to call us back if she would like to move forward with this.

## 2011-10-13 ENCOUNTER — Telehealth: Payer: Self-pay | Admitting: Physical Medicine & Rehabilitation

## 2011-10-13 ENCOUNTER — Encounter: Payer: Self-pay | Admitting: Internal Medicine

## 2011-10-13 ENCOUNTER — Telehealth: Payer: Self-pay

## 2011-10-13 ENCOUNTER — Ambulatory Visit (INDEPENDENT_AMBULATORY_CARE_PROVIDER_SITE_OTHER): Payer: Self-pay | Admitting: Internal Medicine

## 2011-10-13 VITALS — BP 143/85 | HR 77 | Temp 97.3°F | Ht 63.0 in | Wt 123.9 lb

## 2011-10-13 DIAGNOSIS — I2510 Atherosclerotic heart disease of native coronary artery without angina pectoris: Secondary | ICD-10-CM

## 2011-10-13 DIAGNOSIS — F419 Anxiety disorder, unspecified: Secondary | ICD-10-CM

## 2011-10-13 DIAGNOSIS — K529 Noninfective gastroenteritis and colitis, unspecified: Secondary | ICD-10-CM

## 2011-10-13 DIAGNOSIS — M47817 Spondylosis without myelopathy or radiculopathy, lumbosacral region: Secondary | ICD-10-CM

## 2011-10-13 DIAGNOSIS — M549 Dorsalgia, unspecified: Secondary | ICD-10-CM

## 2011-10-13 DIAGNOSIS — I10 Essential (primary) hypertension: Secondary | ICD-10-CM

## 2011-10-13 DIAGNOSIS — F411 Generalized anxiety disorder: Secondary | ICD-10-CM

## 2011-10-13 DIAGNOSIS — R197 Diarrhea, unspecified: Secondary | ICD-10-CM

## 2011-10-13 DIAGNOSIS — R079 Chest pain, unspecified: Secondary | ICD-10-CM

## 2011-10-13 DIAGNOSIS — F329 Major depressive disorder, single episode, unspecified: Secondary | ICD-10-CM

## 2011-10-13 DIAGNOSIS — G8929 Other chronic pain: Secondary | ICD-10-CM

## 2011-10-13 MED ORDER — MELOXICAM 15 MG PO TABS: 15.0000 mg | ORAL_TABLET | Freq: Every day | ORAL | Status: DC

## 2011-10-13 MED ORDER — OMEPRAZOLE 40 MG PO CPDR: 40.0000 mg | DELAYED_RELEASE_CAPSULE | Freq: Every day | ORAL | Status: DC

## 2011-10-13 MED ORDER — CLONAZEPAM 0.5 MG PO TABS: 0.5000 mg | ORAL_TABLET | Freq: Three times a day (TID) | ORAL | Status: DC | PRN

## 2011-10-13 MED ORDER — FOLIC ACID 1 MG PO TABS: 1.0000 mg | ORAL_TABLET | Freq: Every day | ORAL | Status: DC

## 2011-10-13 MED ORDER — TRAMADOL HCL 50 MG PO TABS: 50.0000 mg | ORAL_TABLET | Freq: Four times a day (QID) | ORAL | Status: DC | PRN

## 2011-10-13 MED ORDER — GABAPENTIN 300 MG PO CAPS: 300.0000 mg | ORAL_CAPSULE | Freq: Three times a day (TID) | ORAL | Status: DC

## 2011-10-13 MED ORDER — LISINOPRIL 10 MG PO TABS: 10.0000 mg | ORAL_TABLET | Freq: Every day | ORAL | Status: DC

## 2011-10-13 MED ORDER — SIMVASTATIN 40 MG PO TABS: 40.0000 mg | ORAL_TABLET | Freq: Every evening | ORAL | Status: DC

## 2011-10-13 MED ORDER — CITALOPRAM HYDROBROMIDE 20 MG PO TABS: 20.0000 mg | ORAL_TABLET | Freq: Every day | ORAL | Status: DC

## 2011-10-13 MED ORDER — METHOCARBAMOL 500 MG PO TABS: 500.0000 mg | ORAL_TABLET | Freq: Three times a day (TID) | ORAL | Status: DC

## 2011-10-13 MED ORDER — HYDROCHLOROTHIAZIDE 25 MG PO TABS: 25.0000 mg | ORAL_TABLET | Freq: Every day | ORAL | Status: DC

## 2011-10-13 MED ORDER — NITROGLYCERIN 0.4 MG SL SUBL: 0.4000 mg | SUBLINGUAL_TABLET | SUBLINGUAL | Status: DC | PRN

## 2011-10-13 MED ORDER — AMLODIPINE BESYLATE 2.5 MG PO TABS: 2.5000 mg | ORAL_TABLET | Freq: Every day | ORAL | Status: DC

## 2011-10-13 MED ORDER — ASPIRIN EC 81 MG PO TBEC: 81.0000 mg | DELAYED_RELEASE_TABLET | Freq: Every day | ORAL | Status: DC

## 2011-10-13 NOTE — Patient Instructions (Signed)
Please continue to take your medications especially for your high blood pressure and cholesterol.

## 2011-10-13 NOTE — Telephone Encounter (Signed)
Rx have been called in for the pt, she is aware.

## 2011-10-13 NOTE — Progress Notes (Signed)
I saw patient and discussed her care with resident Dr. Garald Braver.  I agree with the assessment and plans as outlined in her note.

## 2011-10-13 NOTE — Assessment & Plan Note (Addendum)
She is still experiencing anxiety.  --Will continue Klonopin 0.5mg  TID and Celexa. Printed prescriptions given

## 2011-10-13 NOTE — Progress Notes (Signed)
  Subjective:    Patient ID: Brenda [name], female    DOB: 07/31/1960, 51 y.o.   MRN: [id number]  HPI Ms. Blaydes is a 51 yr old woman with PMH significant for HTN, Hyperlipidemia, and chronic back pain, who present today for medication changes and refills, as well as evaluation of chronic diarrhea. She has her prescriptions filled at different pharmacies and would like all her prescriptions printed out. She prefers, however, to have all her pain medications managed by Dr[facility]--she has an appointment with them on Monday.    Her diarrhea started over one year ago. She describes the diarrhea as recurrent, with soft to watery stools, ocurring daily, immediately after eating, with no particular foods identified as triggers. She has been more flatulent with a diffuse abdominal pain and occasional stomach pain and nausea.     Review of Systems  Constitutional: Negative for fever, chills, activity change, appetite change and unexpected weight change.  Cardiovascular: Negative for chest pain.  Gastrointestinal: Positive for abdominal distention.       No blood in the stool. No hemorrhoids or rectal pain  Musculoskeletal: Positive for back pain.       Lower back        Objective:   Physical Exam  Constitutional: She appears well-developed and well-nourished. No distress.  HENT:  Head: Normocephalic and atraumatic.  Eyes: Conjunctivae are normal.  Cardiovascular: Normal rate and normal heart sounds.   Pulmonary/Chest: Effort normal and breath sounds normal. No respiratory distress. She has no wheezes. She has no rales. She exhibits no tenderness.  Abdominal: Soft. She exhibits distension. She exhibits no mass. There is tenderness. There is no rebound and no guarding.       Abdomen mildly distended with hyperactive bowel sounds and diffusely tender to deep palpation.   Musculoskeletal: She exhibits tenderness.       Bilateral paraspinal tenderness over lumbar and sacral  spine. Two 2cm well-healed scars on left next to lumbar and sacral spines consistent with sites of previously drained abscesses. Right straight leg raise test positive   Skin: She is not diaphoretic.          Assessment & Plan:

## 2011-10-13 NOTE — Telephone Encounter (Signed)
Lm for pt to call office regarding results and to set up a LESI appt.

## 2011-10-13 NOTE — Telephone Encounter (Signed)
LM for pt to call back.

## 2011-10-13 NOTE — Telephone Encounter (Signed)
Rx was to be called in, but does not know what it was or where it was called in to.  Please call.

## 2011-10-13 NOTE — Assessment & Plan Note (Addendum)
BP of 143/87 today.  --Refilled amlodipine, HCTZ, lisinopril. Printed prescription given

## 2011-10-13 NOTE — Assessment & Plan Note (Signed)
She has had chronic back pain for a long time. Currently being followed by Dr. Doroteo Bradford. She had a spine MRI yesterday, results still pending. She requests that her pain medications be managed by the pain clinic.

## 2011-10-13 NOTE — Assessment & Plan Note (Signed)
Pt not taking Zocor recently secondary to lack of prescription.  --Printed Zocor prescription given

## 2011-10-13 NOTE — Assessment & Plan Note (Addendum)
--  Referred her to a GI specialist.  --Ordered fecal fat qualitative analysis, O+P, lactoferrin, leukocytes, C. Diff toxin A+B, and stool culture

## 2011-10-13 NOTE — Telephone Encounter (Signed)
Message copied by Judd Gaudier on Tue Oct 13, 2011 11:18 AM ------      Message from: Erick Colace      Created: Mon Oct 12, 2011  1:02 PM       Please schedule for R L$ trans LESI no kit, may add on 7/5 am schedule

## 2011-10-13 NOTE — Assessment & Plan Note (Addendum)
She reports experiencing chest pain two weeks ago which she thought to be related to her CAD or severe back pain. Will consider work-up if it continues/reoccurs.  --Printed prescription for Nitrostat SL tablet given

## 2011-10-13 NOTE — Assessment & Plan Note (Addendum)
Pt with long term depression care, already on celexa.  --Will continue Celexa, printed prescription given

## 2011-10-14 NOTE — Addendum Note (Signed)
Addended by: Sara Chu D on: 10/14/2011 08:41 AM   Modules accepted: Orders

## 2011-10-19 ENCOUNTER — Encounter: Payer: Self-pay | Attending: Physical Medicine & Rehabilitation

## 2011-10-19 ENCOUNTER — Other Ambulatory Visit: Payer: Self-pay

## 2011-10-19 ENCOUNTER — Encounter: Payer: Self-pay | Admitting: Physical Medicine & Rehabilitation

## 2011-10-19 ENCOUNTER — Ambulatory Visit (HOSPITAL_BASED_OUTPATIENT_CLINIC_OR_DEPARTMENT_OTHER): Payer: Self-pay | Admitting: Physical Medicine & Rehabilitation

## 2011-10-19 VITALS — BP 137/75 | HR 89 | Resp 14 | Ht 63.0 in | Wt 125.0 lb

## 2011-10-19 DIAGNOSIS — M5430 Sciatica, unspecified side: Secondary | ICD-10-CM | POA: Insufficient documentation

## 2011-10-19 DIAGNOSIS — R209 Unspecified disturbances of skin sensation: Secondary | ICD-10-CM | POA: Insufficient documentation

## 2011-10-19 DIAGNOSIS — M533 Sacrococcygeal disorders, not elsewhere classified: Secondary | ICD-10-CM

## 2011-10-19 DIAGNOSIS — M47817 Spondylosis without myelopathy or radiculopathy, lumbosacral region: Secondary | ICD-10-CM

## 2011-10-19 DIAGNOSIS — M549 Dorsalgia, unspecified: Secondary | ICD-10-CM | POA: Insufficient documentation

## 2011-10-19 MED ORDER — METHOCARBAMOL 750 MG PO TABS: 750.0000 mg | ORAL_TABLET | Freq: Three times a day (TID) | ORAL | Status: AC

## 2011-10-19 MED ORDER — METHOCARBAMOL 750 MG PO TABS: 750.0000 mg | ORAL_TABLET | Freq: Three times a day (TID) | ORAL | Status: DC
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
## 2011-10-19 NOTE — Patient Instructions (Addendum)
Epidural Steroid Injection An epidural steroid injection is given to relieve pain in the neck, back, or legs. This procedure involves injecting a steroid and numbing medicine (local anesthetic) into the epidural space. The epidural space is the space between the outer covering of the spinal cord and the vertebra. The epidural steroid injection helps in reducing the pain that is caused by the irritation or swelling of the nerve root. However, it does not cure the underlying problem. The injection may be given for the following conditions:  Changes in the soft, gel-like cushion between two vertebrae (disk) due to wear and tear.   A reduction in the space within the spinal canal.   Slipped or herniated disk.   Low back (lumbar) sprain.   Sciatica. This is shooting pain that radiates down the buttocks and the back of the leg due to compression of the nerve.   Traumatic compression fracture of the vertebra.   Pain that develops after a surgery of the spine.   Pain that arises after an attack of viral infection affecting the nerves (shingles).  LET YOUR CAREGIVER KNOW ABOUT:   Allergies to food or medicine.   Medicines taken, including vitamins, herbs, eyedrops, over-the-counter medicines, and creams.   Use of steroids (by mouth or creams).   Previous problems with anesthetics or numbing medicines.   History of bleeding problems or blood clots.   Previous surgery.   Other health problems, including diabetes and kidney problems.   Possibility of pregnancy, if this applies.     RISKS AND COMPLICATIONS The complications due to the needle insertion are:  Headache.   Bleeding.   Infection.   Allergic reaction to the medicines.   Damage to the nerves.  The complications due to the steroid are:  Weight gain.   Hot flashes.   Mood swings.   Lack of sleep.   Increase in blood sugar levels, especially if you are diabetic.   Retention of water.  The response to this  procedure depends on the underlying cause of the pain and its duration. Patients who have long-term (chronic) pain are less likely to benefit from epidural steroids than are patients whose pain comes on strong and suddenly. BEFORE THE PROCEDURE   The caregiver may ask about your symptoms, do a detailed exam, and advise some tests. These tests may include imaging studies. Your caregiver may review the results of your tests and discuss the procedure with you.   Ask your caregiver about changing or stopping your regular medicines. You may be advised to stop taking blood-thinning medicines a few days before the procedure.   You may also be given medicines to reduce your anxiety.  PROCEDURE  You will remain awake during the whole procedure. Although, you may receive medicine to make you sleepy. You will be asked to lie on your stomach. The site of the injection is cleansed. Then, the injection site is numbed using a small amount of medicine that numbs the area (local anesthetic). A hollow needle is directed through your skin into the epidural space with the help of an X-ray. The X-ray helps to ensure that the steroid is delivered closest to the affected nerve. You may have some minimal discomfort at this time. Once the needle is in the right position, the local anesthetic and the steroid are injected into the epidural space. The needle is then removed. The skin is cleaned and a bandage is applied. The entire procedure takes only a few minutes, although repeated injections may   be required (up to 3 to 4 injections over several weeks).  AFTER THE PROCEDURE   You may be monitored for a short time before you go home.   You may feel weakness or numbness in your arm or leg, which disappears within 1 to 2 hours.   Someone must drive you home or accompany you home if you are taking a taxi.   You may be allowed to eat, drink, and take your regular medicine.   Your pain may improve or worsen right after the  procedure.   You may feel the beneficial effect of the steroid a few days later.   You may have soreness at the site of the injection.   If you have only partial relief of the pain, the injection may be repeated once or even twice within 4 to 8 weeks of the initial injection.  Document Released: 07/07/2007 Document Revised: 03/19/2011 Document Reviewed: 08/09/2008 [employer] Patient Information 2012 [employer]. 

## 2011-10-23 ENCOUNTER — Encounter (HOSPITAL_COMMUNITY): Payer: Self-pay

## 2011-10-23 ENCOUNTER — Emergency Department (HOSPITAL_COMMUNITY)
Admission: EM | Admit: 2011-10-23 | Discharge: 2011-10-23 | Disposition: A | Discharge status: Home / Self Care | Payer: Self-pay | Attending: Emergency Medicine | Admitting: Emergency Medicine

## 2011-10-23 DIAGNOSIS — Z8673 Personal history of transient ischemic attack (TIA), and cerebral infarction without residual deficits: Secondary | ICD-10-CM | POA: Insufficient documentation

## 2011-10-23 DIAGNOSIS — I10 Essential (primary) hypertension: Secondary | ICD-10-CM | POA: Insufficient documentation

## 2011-10-23 DIAGNOSIS — Z951 Presence of aortocoronary bypass graft: Secondary | ICD-10-CM | POA: Insufficient documentation

## 2011-10-23 DIAGNOSIS — I2510 Atherosclerotic heart disease of native coronary artery without angina pectoris: Secondary | ICD-10-CM | POA: Insufficient documentation

## 2011-10-23 DIAGNOSIS — E785 Hyperlipidemia, unspecified: Secondary | ICD-10-CM | POA: Insufficient documentation

## 2011-10-23 DIAGNOSIS — K0889 Other specified disorders of teeth and supporting structures: Secondary | ICD-10-CM

## 2011-10-23 DIAGNOSIS — K089 Disorder of teeth and supporting structures, unspecified: Secondary | ICD-10-CM | POA: Insufficient documentation

## 2011-10-23 DIAGNOSIS — F17200 Nicotine dependence, unspecified, uncomplicated: Secondary | ICD-10-CM | POA: Insufficient documentation

## 2011-10-23 HISTORY — DX: Irritable bowel syndrome, unspecified: K58.9

## 2011-10-23 MED ORDER — OXYCODONE-ACETAMINOPHEN 5-325 MG PO TABS
1.0000 | ORAL_TABLET | Freq: Once | ORAL | Status: AC
  Administered 2011-10-23: 1 via ORAL
  Filled 2011-10-23: qty 1

## 2011-10-23 MED ORDER — OXYCODONE-ACETAMINOPHEN 5-325 MG PO TABS: 1.0000 | ORAL_TABLET | ORAL | Status: AC | PRN

## 2011-10-23 MED ORDER — PENICILLIN V POTASSIUM 250 MG PO TABS: 250.0000 mg | ORAL_TABLET | Freq: Four times a day (QID) | ORAL | Status: AC

## 2011-10-23 NOTE — ED Provider Notes (Signed)
Seen by me patient with dental pain or 3 days pain worse with eating. On exam patient alert nontoxic generally poor dentition no trismus no malocclusion  Doug Sou, MD 10/23/11 1108

## 2011-10-23 NOTE — ED Notes (Signed)
Pt states she has been having dental pain for about two days. Pt states pain level is 8, pt states she took  2 tramadol between 6a.m and 9a.m and one 81 mg aspirin at 9 am w/o relieve
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
## 2011-10-23 NOTE — ED Notes (Signed)
Pt is being D/C with her boyfriend

## 2011-10-24 NOTE — ED Provider Notes (Signed)
Medical screening examination/treatment/procedure(s) were conducted as a shared visit with non-physician practitioner(s) and myself.  I personally evaluated the patient during the encounter  Doug Sou, MD 10/24/11 [phone]

## 2011-11-10 ENCOUNTER — Encounter: Payer: Self-pay | Admitting: Physical Medicine & Rehabilitation

## 2011-11-10 ENCOUNTER — Ambulatory Visit (HOSPITAL_BASED_OUTPATIENT_CLINIC_OR_DEPARTMENT_OTHER): Payer: Self-pay | Admitting: Physical Medicine & Rehabilitation

## 2011-11-10 VITALS — BP 158/96 | HR 96 | Resp 16 | Ht 63.0 in | Wt 125.0 lb

## 2011-11-10 DIAGNOSIS — M5430 Sciatica, unspecified side: Secondary | ICD-10-CM

## 2011-11-10 NOTE — Patient Instructions (Signed)
Epidural Steroid Injection Care After  Refer to this sheet in the next few weeks. These instructions provide you with information on caring for yourself after your procedure. Your caregiver may also give you more specific instructions. Your treatment has been planned according to current medical practices, but problems sometimes occur. Call your caregiver if you have any problems or questions after your procedure. HOME CARE INSTRUCTIONS   Avoid the use of heat on the injection site for a day.   Do not have a tub bath or soak in water for the rest of the day.   Remove the bandage on the next day.   Resume your normal activities on the next day.   Use ice packs or mild pain relievers to reduce the soreness around the injection site.   Follow up with your caregiver 7 to 10 days after the procedure.  SEEK MEDICAL CARE IF:   You develop a fever of more than 100.5 F (38.1 C).   You continue to have pain and soreness over the injection site even after taking medicines.   You develop significant nausea or vomiting.  SEEK IMMEDIATE MEDICAL CARE IF:   You have severe back pain, which is not relieved by medicines.   You develop severe headache, stiff neck, or sensitivity to light.   You develop any new numbness or weakness of your legs.   You lose control over your bladder or bowel movements.   You develop a fever of more than 102 F (38.9 C).   You develop difficulty breathing.  Document Released: 07/15/2010 Document Revised: 03/19/2011 Document Reviewed: 07/15/2010 [employer] Patient Information 2012 [employer]. 

## 2011-11-10 NOTE — Progress Notes (Signed)
Lumbar transforaminal Right L4 epidural steroid injection under fluoroscopic guidance  Indication: Lumbosacral radiculitis is not relieved by medication management or other conservative care and interfering with self-care and mobility.   Informed consent was obtained after describing risk and benefits of the procedure with the patient, this includes bleeding, bruising, infection, paralysis and medication side effects.  The patient wishes to proceed and has given written consent.  Patient was placed in prone position.  The lumbar area was marked and prepped with Betadine.  It was entered with a 25-gauge 1-1/2 inch needle and one mL of 1% lidocaine was injected into the skin and subcutaneous tissue.  Then a 22-gauge 3.5  spinal needle was inserted into the R L4-5 intervertebral foramen under AP, lateral, and oblique view.  Then a solution containing one mL of 10 mg per mL dexamethasone and 2 mL of 1% lidocaine was injected.  The patient tolerated procedure well.  Post procedure instructions were given.  Please see post procedure form.

## 2011-11-10 NOTE — Progress Notes (Signed)
  PROCEDURE RECORD [facility]   Name: Brenda [name] DOB:1960-06-03 MRN: [id number]  Date:11/10/2011  Physician: Claudette Laws, MD    Nurse/CMA: Redgie Grayer  Allergies: No Known Allergies  Consent Signed: yes  Is patient diabetic? no   Pregnant: no LMP: Patient's last menstrual period was 10/12/2005. (age 50-55)  Anticoagulants: no Anti-inflammatory: no Antibiotics: yes (PCN)  Procedure: Trans LESI  Position: Prone Start Time: 2:08pm  End Time: 2:15pm  Fluoro Time: 21 seconds  RN/CMA Carroll,CMA Carroll,CMA    Time 1:43pm 2:16pm    BP 158/96 166/79    Pulse 96 83    Respirations 16 16    O2 Sat 100% 95%    S/S 6 6    Pain Level 7-8/10 7/10     D/C home with Ellen Henri, patient A & O X 3, D/C instructions reviewed, and sits independently.

## 2011-11-11 ENCOUNTER — Encounter: Payer: Self-pay | Admitting: Internal Medicine

## 2011-11-11 ENCOUNTER — Ambulatory Visit (INDEPENDENT_AMBULATORY_CARE_PROVIDER_SITE_OTHER): Payer: Self-pay | Admitting: Internal Medicine

## 2011-11-11 VITALS — BP 160/76 | HR 80 | Ht 63.0 in | Wt 125.4 lb

## 2011-11-11 DIAGNOSIS — R197 Diarrhea, unspecified: Secondary | ICD-10-CM

## 2011-11-11 DIAGNOSIS — K529 Noninfective gastroenteritis and colitis, unspecified: Secondary | ICD-10-CM

## 2011-11-11 DIAGNOSIS — R159 Full incontinence of feces: Secondary | ICD-10-CM

## 2011-11-11 MED ORDER — MOVIPREP 100 G PO SOLR: ORAL | Status: DC

## 2011-11-11 NOTE — Progress Notes (Signed)
  Subjective:    Patient ID: Brenda [name], female    DOB: Aug 04, 1960, 51 y.o.   MRN: [id number]  HPI This middle-aged white woman presents complaining of chronic diarrhea and abdominal pain. She says it has been a problem for years but much worse in the past several months to year.Stools are stringy, tan  and float and are often post-prandial but she is now experiencing fecal soiling at night in bed, at least a few times. Significant intense lower abdominal cramping accompanies defecation. She is fearful of sleep due to the incontinence and also leaving the house. She also has stress urinary incontinence. She has had 3 children and 2 episiotomies.   Medications, allergies, past medical history, past surgical history, family history and social history are reviewed and updated in the EMR.  Review of Systems + anxiety, chronic low back pain - seen in pain clinic and s/p epidural injection yesterday, muscle pains, night sweats, urinary frequency All other ros negative or as per HPI    Objective:   Physical Exam General:  Well-developed, well-nourished and in no acute distress Eyes:  anicteric. ENT:   Mouth and posterior pharynx free of mucosal lesions, missing teeth and remaining in poor repair Neck:   supple w/o thyromegaly or mass.  Lungs: Clear to auscultation bilaterally. Heart:  S1S2, no rubs, murmurs, gallops. Abdomen:  soft, non-tender, no hepatosplenomegaly, hernia, or mass and BS+.  Rectal: Female staff present - absent anal wink, small tags in anoderm, no mass, resting tone normal, reduced squeeze, simulated defecation with appropriate descent but      abdominal wall contraction reduced Lymph:  no cervical or supraclavicular adenopathy. Extremities:   no edema Skin   no rash. Neuro:  A&O x 3.  Psych:  appropriate mood and  Affect.   Data Reviewed: Lab Results  Component Value Date   WBC 9.0 02/17/2011   HGB 13.8 02/17/2011   HCT 40.5 02/17/2011   MCV 98.1 02/17/2011   PLT 233  02/17/2011   Lab Results  Component Value Date   TSH 2.018 02/05/2010     Chemistry      Component Value Date/Time   NA 143 05/11/2011 1535   K 3.3* 05/11/2011 1535   CL 109 05/11/2011 1535   CO2 20 05/11/2011 1535   BUN 15 10/12/2011 0930   CREATININE 0.52 10/12/2011 0930   CREATININE 0.61 04/30/2011 1445      Component Value Date/Time   CALCIUM 9.4 05/11/2011 1535   ALKPHOS 78 05/11/2011 1535   AST 15 05/11/2011 1535   ALT 6 05/11/2011 1535   BILITOT 0.2* 05/11/2011 1535          Assessment & Plan:   1. Chronic diarrhea  Ambulatory referral to Gastroenterology  2. Fecal incontinence     1. colonoscopy to evaluate for cause. Suspect IBS but chronicity and severity of symptoms and age indicate need for diagnostic colonoscopy and terminal ileum inspection. Random colon biopsies anticipated. The risks, benefits, and alternatives to endoscopy with possible biopsy and possible dilation were discussed with the patient and they consent to proceed.  2. Some of her problems with incontinence may be related to the weak sphincter tone that I suspect is from prior deliveries and episiotomies as opposed to lumbosacral spine problems though that could contribute.  I appreciate the opportunity to care for this patient.  NW:[id number], Winona Legato, MD

## 2011-11-11 NOTE — Patient Instructions (Addendum)
You have been scheduled for a colonoscopy with propofol. Please follow written instructions given to you at your visit today.  Please use sample Moviprep kit given to you today. If you use inhalers (even only as needed), please bring them with you on the day of your procedure.   Per Dr. Leone Payor you may use Imodium as needed.  Thank you for choosing me and [facility].  Iva Boop, M.D., [facility]

## 2011-11-19 ENCOUNTER — Encounter: Payer: Self-pay | Admitting: Internal Medicine

## 2011-11-19 ENCOUNTER — Ambulatory Visit (AMBULATORY_SURGERY_CENTER): Payer: Self-pay | Admitting: Internal Medicine

## 2011-11-19 VITALS — BP 183/87 | HR 62 | Temp 97.2°F | Resp 16 | Ht 63.0 in | Wt 125.0 lb

## 2011-11-19 DIAGNOSIS — R197 Diarrhea, unspecified: Secondary | ICD-10-CM

## 2011-11-19 DIAGNOSIS — D126 Benign neoplasm of colon, unspecified: Secondary | ICD-10-CM

## 2011-11-19 MED ORDER — SODIUM CHLORIDE 0.9 % IV SOLN: 500.0000 mL | INTRAVENOUS | Status: DC

## 2011-11-19 NOTE — Op Note (Signed)
[facility] [address] [place], [place]  [address]  COLONOSCOPY PROCEDURE REPORT  PATIENT:  Brenda [name], Brenda [name]  MR#:  [id number] BIRTHDATE:  [birth year], 51 yrs. old  GENDER:  female ENDOSCOPIST:  Iva Boop, MD, [facility] REF. BY:          Sara Chu, MD PROCEDURE DATE:  11/19/2011 PROCEDURE:  Colonoscopy with biopsy ASA CLASS:  Class III INDICATIONS:  unexplained diarrhea MEDICATIONS:   These medications were titrated to patient response per physician's verbal order, MAC sedation, administered by CRNA, propofol (Diprivan) 300 mg IV  DESCRIPTION OF PROCEDURE:   After the risks benefits and alternatives of the procedure were thoroughly explained, informed consent was obtained.  Digital rectal exam was performed and revealed no abnormalities.   The LB CF-H180AL [id number] endoscope was introduced through the anus and advanced to the terminal ileum which was intubated for a short distance, without limitations. The quality of the prep was excellent, using MoviPrep.  The instrument was then slowly withdrawn as the colon was fully examined. <<PROCEDUREIMAGES>> FINDINGS:  A diminutive polyp was found in the sigmoid colon. The polyp was removed using cold biopsy forceps.  This was otherwise a normal examination of the colon and terminal ileum. Random colon biopsies were obtained and sent to pathology.   Retrofle xed views in the rectum revealed no abnormalities.    The time to cecum = 2:10 minutes. The scope was then withdrawn in 11:45 minutes from the cecum and the procedure completed. COMPLICATIONS:  None ENDOSCOPIC IMPRESSION: 1) Diminutive polyp in the sigmoid colon - removed 2) Otherwise normal examination of the colon and terminal ileum - random colon biopsies taken RECOMMENDATIONS: 1) Await biopsy results REPEAT EXAM:  In for Colonoscopy, pending biopsy results.  Iva Boop, MD, [facility]  CC:  The Patient and Sara Chu, MD  n. Rosalie Doctor:   Iva Boop at 11/19/2011 03:47 PM  Noel Gerold, [phone]

## 2011-11-19 NOTE — Patient Instructions (Addendum)
There was one tiny polyp removed. It was not causing problems. The lining of the colon looks ok - I took biopsies of the colon to see if there is inflammation causing the diarrhea.  I will let you know the results and plans.  Thank you for choosing me and [facility].  Iva Boop, MD, FACG   YOU HAD AN ENDOSCOPIC PROCEDURE TODAY AT THE [facility]: Refer to the procedure report that was given to you for any specific questions about what was found during the examination.  If the procedure report does not answer your questions, please call your gastroenterologist to clarify.  If you requested that your care partner not be given the details of your procedure findings, then the procedure report has been included in a sealed envelope for you to review at your convenience later.  YOU SHOULD EXPECT: Some feelings of bloating in the abdomen. Passage of more gas than usual.  Walking can help get rid of the air that was put into your GI tract during the procedure and reduce the bloating. If you had a lower endoscopy (such as a colonoscopy or flexible sigmoidoscopy) you may notice spotting of blood in your stool or on the toilet paper. If you underwent a bowel prep for your procedure, then you may not have a normal bowel movement for a few days.  DIET: Your first meal following the procedure should be a light meal and then it is ok to progress to your normal diet.  A half-sandwich or bowl of soup is an example of a good first meal.  Heavy or fried foods are harder to digest and may make you feel nauseous or bloated.  Likewise meals heavy in dairy and vegetables can cause extra gas to form and this can also increase the bloating.  Drink plenty of fluids but you should avoid alcoholic beverages for 24 hours.  ACTIVITY: Your care partner should take you home directly after the procedure.  You should plan to take it easy, moving slowly for the rest of the day.  You can resume normal  activity the day after the procedure however you should NOT DRIVE or use heavy machinery for 24 hours (because of the sedation medicines used during the test).    SYMPTOMS TO REPORT IMMEDIATELY: A gastroenterologist can be reached at any hour.  During normal business hours, 8:30 AM to 5:00 PM Monday through Friday, call [phone].  After hours and on weekends, please call the GI answering service at [phone] who will take a message and have the physician on call contact you.   Following lower endoscopy (colonoscopy or flexible sigmoidoscopy):  Excessive amounts of blood in the stool  Significant tenderness or worsening of abdominal pains  Swelling of the abdomen that is new, acute  Fever of 100F or higher  Following upper endoscopy (EGD)  Vomiting of blood or coffee ground material  New chest pain or pain under the shoulder blades  Painful or persistently difficult swallowing  New shortness of breath  Fever of 100F or higher  Black, tarry-looking stools  FOLLOW UP: If any biopsies were taken you will be contacted by phone or by letter within the next 1-3 weeks.  Call your gastroenterologist if you have not heard about the biopsies in 3 weeks.  Our staff will call the home number listed on your records the next business day following your procedure to check on you and address any questions or concerns that you may have  at that time regarding the information given to you following your procedure. This is a courtesy call and so if there is no answer at the home number and we have not heard from you through the emergency physician on call, we will assume that you have returned to your regular daily activities without incident.  SIGNATURES/CONFIDENTIALITY: You and/or your care partner have signed paperwork which will be entered into your electronic medical record.  These signatures attest to the fact that that the information above on your After Visit Summary has been reviewed and is  understood.  Full responsibility of the confidentiality of this discharge information lies with you and/or your care-partner.   Polyp information given
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
## 2011-11-20 ENCOUNTER — Telehealth: Payer: Self-pay | Admitting: *Deleted

## 2011-11-20 IMAGING — CR DG CHEST 2V
2 series · 2 of 2 positions shown · non-contrast
Comparison: 03/25/2009

CLINICAL DATA: Chest pain

CHEST - 2 VIEW

[w chest pa]
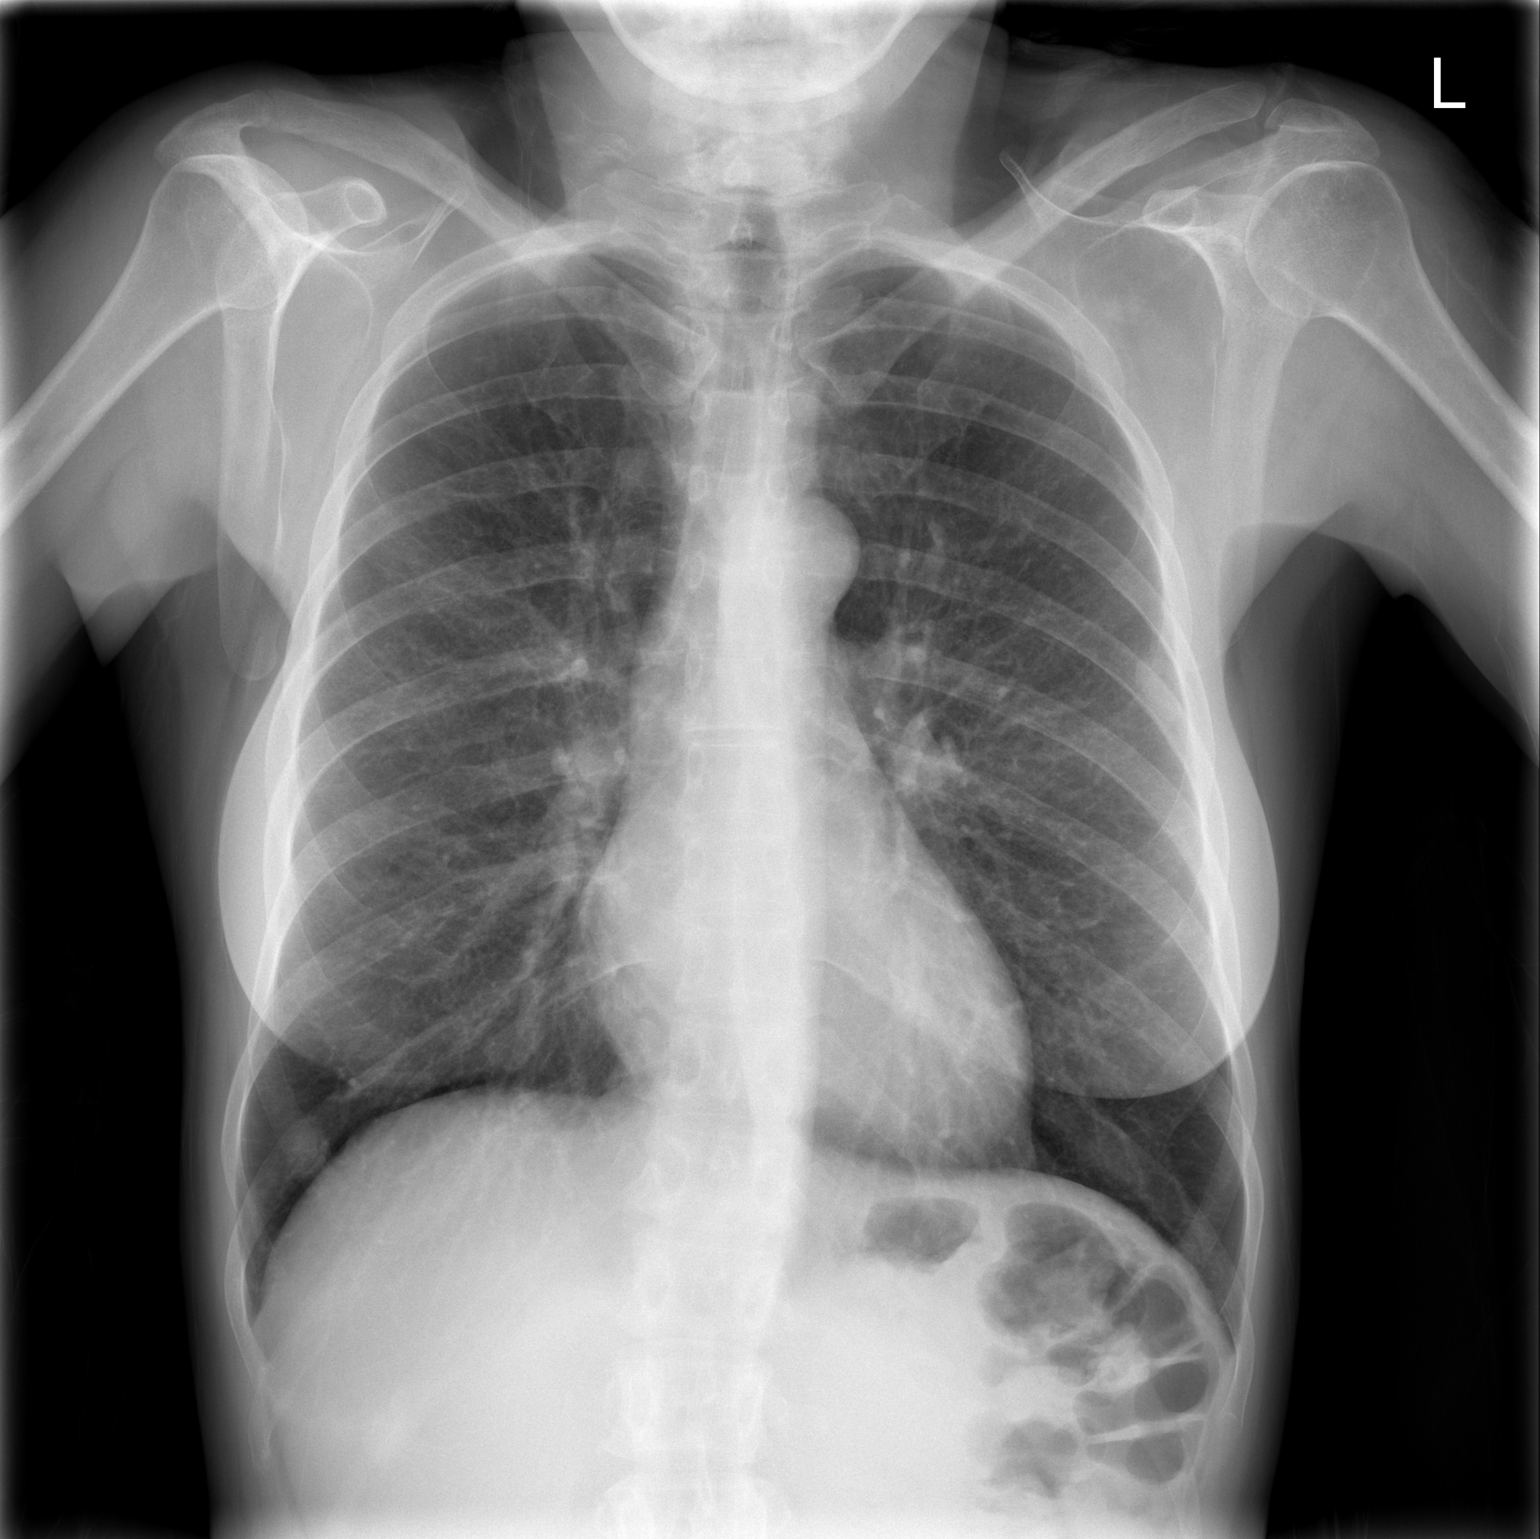

[w chest lat]
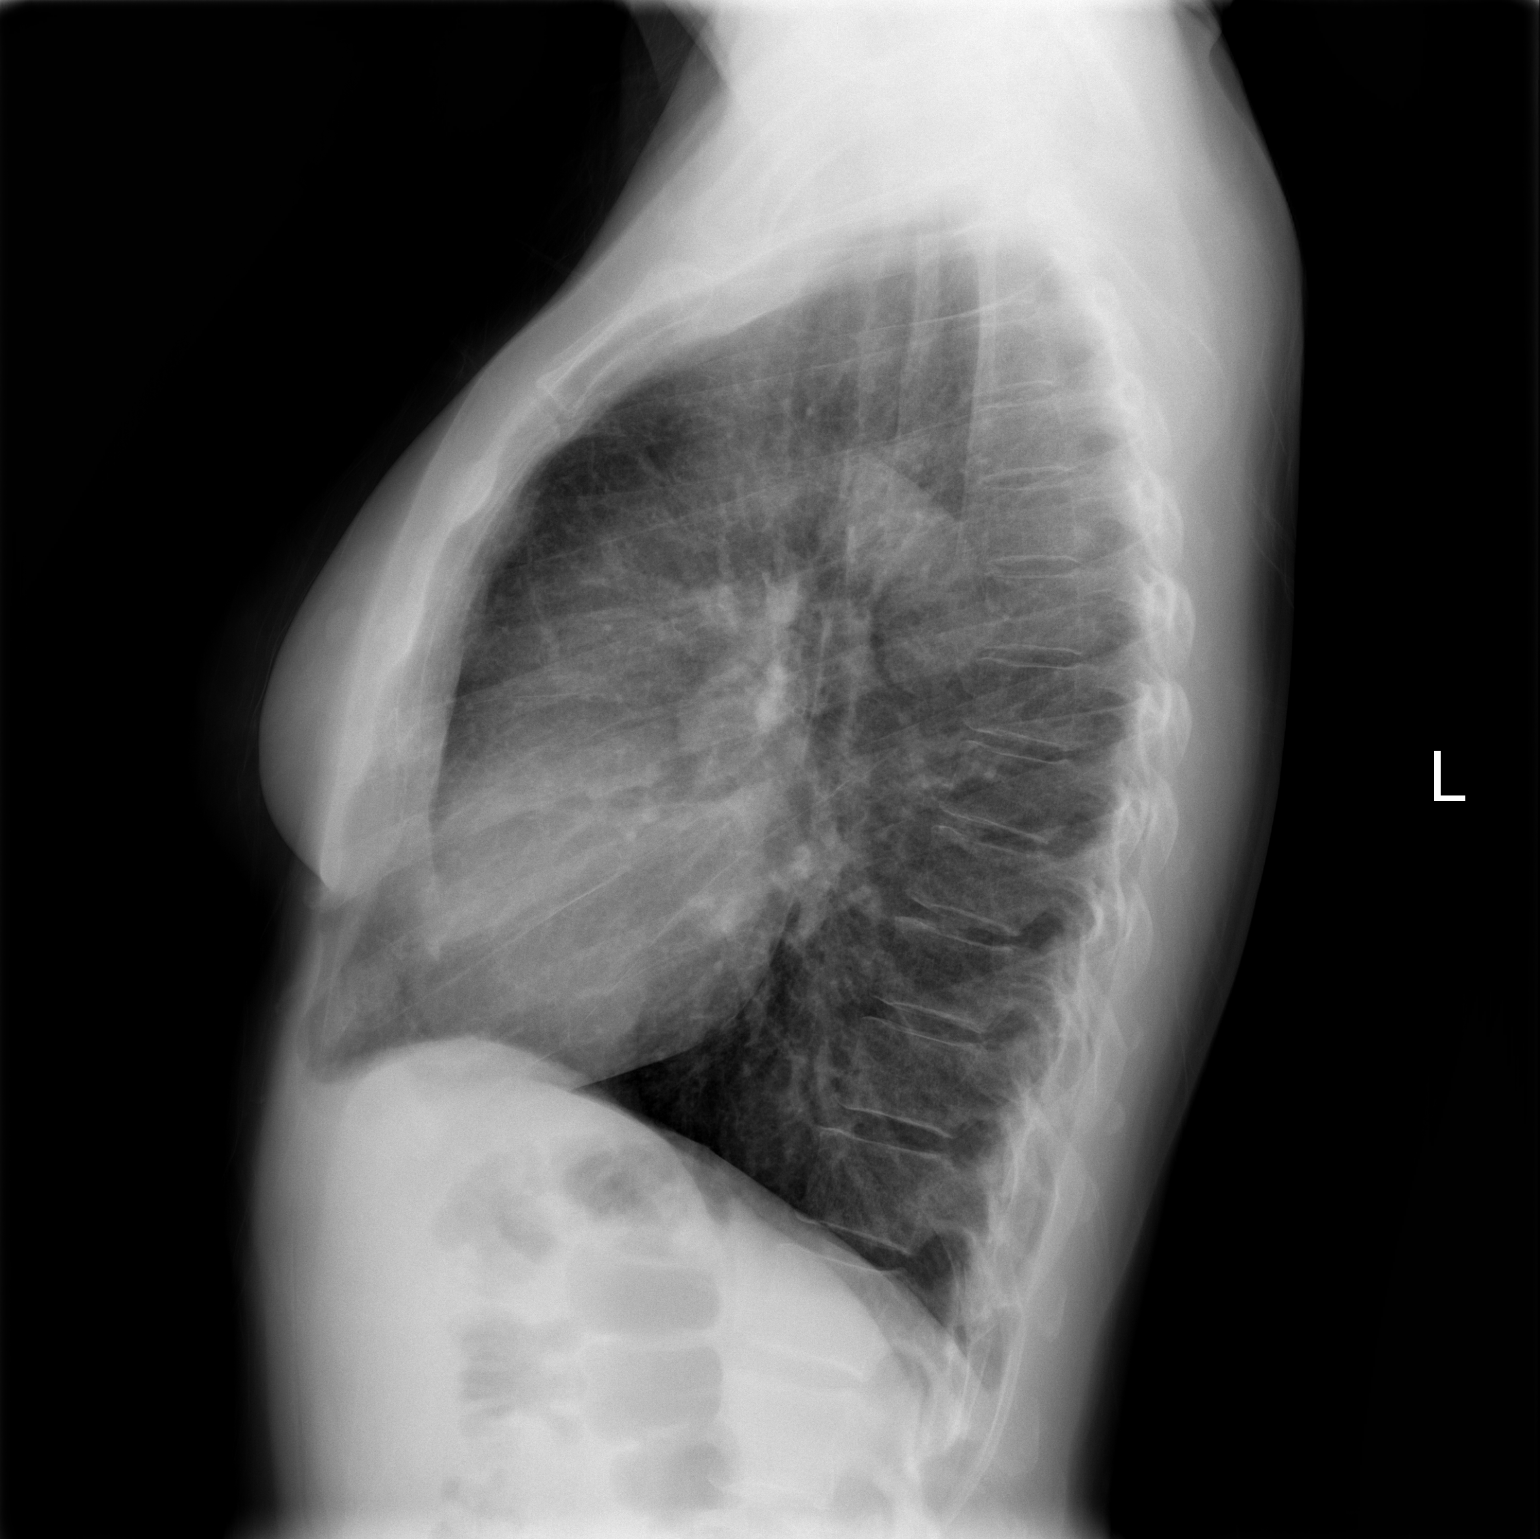

[2 of 2 positions shown; findings below may reference images not displayed]

FINDINGS: Heart and mediastinal contours normal.  Lungs mildly
hyperaerated but clear.  There are healing right anterior rib
fractures.
IMPRESSION: No active disease.

## 2011-11-20 NOTE — Telephone Encounter (Signed)
  Follow up Call-  Call back number 11/19/2011  Post procedure Call Back phone  # [phone]   CELL [phone]  Permission to leave phone message Yes     Let message,no answer on both #'s.

## 2011-11-25 NOTE — Progress Notes (Signed)
Quick Note:  OFFICE  Call her and let her know no colitis on biopsies and the suspected polyp was not a polyp  1) dicyclomine 20 mg before meals and at bedtime #120 with 1 refill 2) stool for fecal elastase to see if diarrhea coming from a pancreatic insufficiency 3) follow-up me 4-8 weeks to see how she is, review, and see if we need to try different therapy 4) next routine colonoscopy 2023  [facility]  1) recall colonoscopy 11/2021 2) no letter ______

## 2011-11-27 ENCOUNTER — Other Ambulatory Visit: Payer: Self-pay

## 2011-11-27 DIAGNOSIS — R197 Diarrhea, unspecified: Secondary | ICD-10-CM

## 2011-11-27 MED ORDER — DICYCLOMINE HCL 20 MG PO TABS: ORAL_TABLET | ORAL | Status: DC

## 2011-11-27 NOTE — Telephone Encounter (Signed)
error 

## 2011-12-17 ENCOUNTER — Encounter: Payer: Self-pay | Attending: Physical Medicine & Rehabilitation

## 2011-12-17 ENCOUNTER — Ambulatory Visit (HOSPITAL_BASED_OUTPATIENT_CLINIC_OR_DEPARTMENT_OTHER): Payer: Self-pay | Admitting: Physical Medicine & Rehabilitation

## 2011-12-17 ENCOUNTER — Encounter: Payer: Self-pay | Admitting: Physical Medicine & Rehabilitation

## 2011-12-17 VITALS — BP 157/70 | HR 87 | Resp 16 | Ht 63.0 in | Wt 128.0 lb

## 2011-12-17 DIAGNOSIS — M5430 Sciatica, unspecified side: Secondary | ICD-10-CM

## 2011-12-17 DIAGNOSIS — M549 Dorsalgia, unspecified: Secondary | ICD-10-CM | POA: Insufficient documentation

## 2011-12-17 DIAGNOSIS — R209 Unspecified disturbances of skin sensation: Secondary | ICD-10-CM | POA: Insufficient documentation

## 2011-12-17 MED ORDER — TRAMADOL HCL 50 MG PO TABS: 50.0000 mg | ORAL_TABLET | Freq: Four times a day (QID) | ORAL | Status: DC | PRN

## 2011-12-17 NOTE — Patient Instructions (Signed)
Epidural Steroid Injection Care After  Refer to this sheet in the next few weeks. These instructions provide you with information on caring for yourself after your procedure. Your caregiver may also give you more specific instructions. Your treatment has been planned according to current medical practices, but problems sometimes occur. Call your caregiver if you have any problems or questions after your procedure. HOME CARE INSTRUCTIONS   Avoid the use of heat on the injection site for a day.   Do not have a tub bath or soak in water for the rest of the day.   Remove the bandage on the next day.   Resume your normal activities on the next day.   Use ice packs or mild pain relievers to reduce the soreness around the injection site.   Follow up with your caregiver 7 to 10 days after the procedure.  SEEK MEDICAL CARE IF:   You develop a fever of more than 100.5 F (38.1 C).   You continue to have pain and soreness over the injection site even after taking medicines.   You develop significant nausea or vomiting.  SEEK IMMEDIATE MEDICAL CARE IF:   You have severe back pain, which is not relieved by medicines.   You develop severe headache, stiff neck, or sensitivity to light.   You develop any new numbness or weakness of your legs.   You lose control over your bladder or bowel movements.   You develop a fever of more than 102 F (38.9 C).   You develop difficulty breathing.  Document Released: 07/15/2010 Document Revised: 03/19/2011 Document Reviewed: 07/15/2010 [employer] Patient Information 2012 [employer]. 

## 2011-12-17 NOTE — Progress Notes (Signed)
  Subjective:    Patient ID: Brenda [name], female    DOB: Aug 19, 1960, 51 y.o.   MRN: [id number]  HPI    Review of Systems     Objective:   Physical Exam        Assessment & Plan:  Lumbar R L4-5 tanslaminar epidural steroid injection under fluoroscopic guidance  Indication: Lumbosacral radiculitis is not relieved by medication management or other conservative care and interfering with self-care and mobility.  Informed consent was obtained after describing risk and benefits of the procedure with the patient, this includes bleeding, bruising, infection, paralysis and medication side effects.  The patient wishes to proceed and has given written consent.  Patient was placed in a prone position.  The lumbar area was marked and prepped with Betadine.  It was entered with a 25-gauge 1-1/2 inch needle and one mL of 1% lidocaine was injected into the skin and subcutaneous tissue.  Then a 17-gauge spinal needle was inserted under fluoroscopic guidance into the R L4-5 interlaminar space under AP and Lateral imaging.  Once needle tip of approximated the posterior elements, a loss of resistance technique was utilized with lateral imaging.  A positive loss of resistance was obtained and then confirmed by injecting 2 mL's of Omnipaque 180.  Then a solution containing 2 mL's of 40 mg per mL depomedrol and 2 mL's of 1% lidocaine was injected.  The patient tolerated procedure well.  Post procedure instructions were given.  Please se post procedure form.

## 2011-12-17 NOTE — Progress Notes (Signed)
  PROCEDURE RECORD [facility]   Name: Brenda [name] DOB:1960-10-18 MRN: [id number]  Date:12/17/2011  Physician: Claudette Laws, MD    Nurse/CMA: Redgie Grayer  Allergies: No Known Allergies  Consent Signed: yes  Is patient diabetic? no    Pregnant: no LMP: No LMP recorded. Patient is not currently having periods (Reason: IUD). (age 49-55)  Anticoagulants: no Anti-inflammatory: no Antibiotics: no  Procedure: Transforaminal ESI  Position: Prone Start Time: 11:09am  End Time: 11:13am  Fluoro Time: 17 seconds  RN/CMA Carroll,CMA Carroll,CMA    Time 10:50am 11:17am    BP 157/70 152/67    Pulse 87 74    Respirations 16 16    O2 Sat 97% 94%    S/S 6 6    Pain Level 5/10 5/10     D/C home with [name] (boyfriend), patient A & O X 3, D/C instructions reviewed, and sits independently.

## 2011-12-19 ENCOUNTER — Emergency Department (HOSPITAL_COMMUNITY)
Admission: EM | Admit: 2011-12-19 | Discharge: 2011-12-19 | Disposition: A | Discharge status: Home / Self Care | Payer: Self-pay | Attending: Emergency Medicine | Admitting: Emergency Medicine

## 2011-12-19 ENCOUNTER — Encounter (HOSPITAL_COMMUNITY): Payer: Self-pay | Admitting: Adult Health

## 2011-12-19 DIAGNOSIS — F17200 Nicotine dependence, unspecified, uncomplicated: Secondary | ICD-10-CM | POA: Insufficient documentation

## 2011-12-19 DIAGNOSIS — K589 Irritable bowel syndrome without diarrhea: Secondary | ICD-10-CM | POA: Insufficient documentation

## 2011-12-19 DIAGNOSIS — M5430 Sciatica, unspecified side: Secondary | ICD-10-CM | POA: Insufficient documentation

## 2011-12-19 DIAGNOSIS — E785 Hyperlipidemia, unspecified: Secondary | ICD-10-CM | POA: Insufficient documentation

## 2011-12-19 DIAGNOSIS — F329 Major depressive disorder, single episode, unspecified: Secondary | ICD-10-CM | POA: Insufficient documentation

## 2011-12-19 DIAGNOSIS — Z79899 Other long term (current) drug therapy: Secondary | ICD-10-CM | POA: Insufficient documentation

## 2011-12-19 DIAGNOSIS — I10 Essential (primary) hypertension: Secondary | ICD-10-CM | POA: Insufficient documentation

## 2011-12-19 DIAGNOSIS — G8929 Other chronic pain: Secondary | ICD-10-CM | POA: Insufficient documentation

## 2011-12-19 DIAGNOSIS — F411 Generalized anxiety disorder: Secondary | ICD-10-CM | POA: Insufficient documentation

## 2011-12-19 DIAGNOSIS — F3289 Other specified depressive episodes: Secondary | ICD-10-CM | POA: Insufficient documentation

## 2011-12-19 MED ORDER — OXYCODONE-ACETAMINOPHEN 5-325 MG PO TABS: 1.0000 | ORAL_TABLET | ORAL | Status: AC | PRN

## 2011-12-19 MED ORDER — HYDROMORPHONE HCL PF 1 MG/ML IJ SOLN
1.0000 mg | Freq: Once | INTRAMUSCULAR | Status: AC
  Administered 2011-12-19: 1 mg via INTRAMUSCULAR
  Filled 2011-12-19: qty 1

## 2011-12-19 MED ORDER — KETOROLAC TROMETHAMINE 30 MG/ML IJ SOLN
30.0000 mg | Freq: Once | INTRAMUSCULAR | Status: AC
  Administered 2011-12-19: 30 mg via INTRAMUSCULAR
  Filled 2011-12-19: qty 1

## 2011-12-19 NOTE — ED Notes (Signed)
Gail, FNP at bedside. 

## 2011-12-19 NOTE — ED Notes (Signed)
C/o lumbar back pain that radiates down the buttock and into the right leg, HX of sciatica. Had a epidural yesterday for the sciatica and it got worse.  Ambulates well.
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
## 2011-12-20 ENCOUNTER — Telehealth: Payer: Self-pay | Admitting: Physical Medicine & Rehabilitation

## 2011-12-20 MED ORDER — AMITRIPTYLINE HCL 25 MG PO TABS: 25.0000 mg | ORAL_TABLET | Freq: Every day | ORAL | Status: DC

## 2011-12-20 MED ORDER — METHYLPREDNISOLONE 4 MG PO KIT: PACK | ORAL | Status: AC

## 2011-12-20 NOTE — Telephone Encounter (Signed)
Pt called with increased "sciatica" on right.  Had to go to ED yesterday. I wrote her rx's for medrol dose pack and elavil. Asked her to contact our office tomorrow.

## 2011-12-20 NOTE — ED Provider Notes (Signed)
Medical screening examination/treatment/procedure(s) were performed by non-physician practitioner and as supervising physician I was immediately available for consultation/collaboration.   Mckyla Deckman, MD 12/20/11 0001 

## 2012-01-04 ENCOUNTER — Encounter: Payer: Self-pay | Admitting: Internal Medicine

## 2012-01-04 ENCOUNTER — Ambulatory Visit (INDEPENDENT_AMBULATORY_CARE_PROVIDER_SITE_OTHER): Payer: Self-pay | Admitting: Internal Medicine

## 2012-01-04 VITALS — BP 124/78 | HR 73 | Temp 97.8°F | Ht 63.0 in | Wt 128.8 lb

## 2012-01-04 DIAGNOSIS — F419 Anxiety disorder, unspecified: Secondary | ICD-10-CM

## 2012-01-04 DIAGNOSIS — F411 Generalized anxiety disorder: Secondary | ICD-10-CM

## 2012-01-04 DIAGNOSIS — R197 Diarrhea, unspecified: Secondary | ICD-10-CM

## 2012-01-04 DIAGNOSIS — Z23 Encounter for immunization: Secondary | ICD-10-CM

## 2012-01-04 DIAGNOSIS — F329 Major depressive disorder, single episode, unspecified: Secondary | ICD-10-CM

## 2012-01-04 DIAGNOSIS — Z299 Encounter for prophylactic measures, unspecified: Secondary | ICD-10-CM

## 2012-01-04 DIAGNOSIS — F3289 Other specified depressive episodes: Secondary | ICD-10-CM

## 2012-01-04 DIAGNOSIS — R079 Chest pain, unspecified: Secondary | ICD-10-CM

## 2012-01-04 DIAGNOSIS — K529 Noninfective gastroenteritis and colitis, unspecified: Secondary | ICD-10-CM

## 2012-01-04 DIAGNOSIS — M549 Dorsalgia, unspecified: Secondary | ICD-10-CM

## 2012-01-04 DIAGNOSIS — I10 Essential (primary) hypertension: Secondary | ICD-10-CM

## 2012-01-04 DIAGNOSIS — M47817 Spondylosis without myelopathy or radiculopathy, lumbosacral region: Secondary | ICD-10-CM

## 2012-01-04 MED ORDER — GABAPENTIN 300 MG PO CAPS: 300.0000 mg | ORAL_CAPSULE | Freq: Three times a day (TID) | ORAL | Status: DC

## 2012-01-04 MED ORDER — AMLODIPINE BESYLATE 2.5 MG PO TABS: 2.5000 mg | ORAL_TABLET | Freq: Every day | ORAL | Status: DC

## 2012-01-04 MED ORDER — CLONAZEPAM 0.5 MG PO TABS: 0.5000 mg | ORAL_TABLET | Freq: Three times a day (TID) | ORAL | Status: DC | PRN

## 2012-01-04 MED ORDER — CITALOPRAM HYDROBROMIDE 20 MG PO TABS: 20.0000 mg | ORAL_TABLET | Freq: Every day | ORAL | Status: DC

## 2012-01-04 MED ORDER — CITALOPRAM HYDROBROMIDE 40 MG PO TABS: 40.0000 mg | ORAL_TABLET | Freq: Every day | ORAL | Status: DC

## 2012-01-04 NOTE — Patient Instructions (Addendum)
-  Follow up in 8 weeks, in November

## 2012-01-05 NOTE — Assessment & Plan Note (Signed)
Pain management at Pain clinic

## 2012-01-05 NOTE — Assessment & Plan Note (Signed)
Pt followed by Dr. Wynn Banker at the Pain clinic. She is receiving monthly epidural injections currently but would like to take pain medications eventually. Will contact Dr. Wynn Banker for discussion on pain medication management so pt receives pain meds from Pain clinic only if this tx is considered beneficial for this patient.

## 2012-01-05 NOTE — Assessment & Plan Note (Signed)
Pt to follow up in 2 months but understands that will start weaning off Klonopin during next visit. She takes Klonopin for anxiety, every day, she has had panic attacks in the past but not recenly.   Referred to CSW  Klonopin 0.5mg  3 times daily PRN for anxiety #90

## 2012-01-05 NOTE — Assessment & Plan Note (Signed)
No complaints today.
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
## 2012-01-05 NOTE — Assessment & Plan Note (Signed)
BP well controlled today.
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
## 2012-01-06 NOTE — Progress Notes (Signed)
INTERNAL MEDICINE TEACHING ATTENDING ADDENDUM Lonzo Cloud , MD: I personally saw and evaluated Ms.Dorough in this clinic visit in conjunction with the resident, Dr. Garald Braver. I have discussed the patient's plan of care with Dr. Garald Braver during this visit. I have confirmed the physical exam findings and have read and agree with the clinic note including the plan.

## 2012-01-07 ENCOUNTER — Telehealth: Payer: Self-pay | Admitting: *Deleted

## 2012-01-07 ENCOUNTER — Telehealth: Payer: Self-pay | Admitting: Licensed Clinical Social Worker

## 2012-01-07 NOTE — Telephone Encounter (Signed)
I spoke to patient's PCP and she wanted to let us know that she had an appointment with the patient and was told that Dr. Wynn Banker wanted to give her pain medication and that there was a form she needed to fill out. I advised Dr. Garald Braver that she is being treated with non-narcotic medications and the patient is aware of this.  Dr. Garald Braver states that she is giving her Clonazepam on a trail basis.
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
## 2012-01-14 ENCOUNTER — Encounter: Payer: Self-pay | Admitting: Physical Medicine & Rehabilitation

## 2012-01-14 ENCOUNTER — Other Ambulatory Visit: Payer: Self-pay | Admitting: *Deleted

## 2012-01-14 ENCOUNTER — Ambulatory Visit (HOSPITAL_BASED_OUTPATIENT_CLINIC_OR_DEPARTMENT_OTHER): Payer: Self-pay | Admitting: Physical Medicine & Rehabilitation

## 2012-01-14 ENCOUNTER — Encounter: Payer: Self-pay | Attending: Physical Medicine & Rehabilitation

## 2012-01-14 VITALS — BP 137/93 | HR 85 | Resp 14 | Ht 63.0 in | Wt 130.0 lb

## 2012-01-14 DIAGNOSIS — M47817 Spondylosis without myelopathy or radiculopathy, lumbosacral region: Secondary | ICD-10-CM

## 2012-01-14 DIAGNOSIS — R209 Unspecified disturbances of skin sensation: Secondary | ICD-10-CM | POA: Insufficient documentation

## 2012-01-14 DIAGNOSIS — M5430 Sciatica, unspecified side: Secondary | ICD-10-CM | POA: Insufficient documentation

## 2012-01-14 DIAGNOSIS — M549 Dorsalgia, unspecified: Secondary | ICD-10-CM | POA: Insufficient documentation

## 2012-01-14 MED ORDER — METHOCARBAMOL 500 MG PO TABS
500.0000 mg | ORAL_TABLET | Freq: Three times a day (TID) | ORAL | Status: DC
Start: 2012-01-14 — End: 2012-07-23

## 2012-01-14 MED ORDER — MELOXICAM 15 MG PO TABS: 15.0000 mg | ORAL_TABLET | Freq: Every day | ORAL | Status: DC

## 2012-01-14 NOTE — Telephone Encounter (Signed)
Meloxicam and Methocarbamol was called to [facility] Department since they do not have e-scripts.

## 2012-01-14 NOTE — Patient Instructions (Addendum)
We injected the medial branches at L3-4-5 on the right side today Next visit will be right L4 transforaminal epidural injection for the pain shooting down the leg We discussed nonnarcotic treatment plan We discussed possibility of surgical referral

## 2012-01-14 NOTE — Progress Notes (Signed)
  PROCEDURE RECORD [facility]   Name: Brenda [name] DOB:1960-12-09 MRN: [id number]  Date:01/14/2012  Physician: Claudette Laws, MD    Nurse/CMA: Levens/Shumaker  Allergies: No Known Allergies  Consent Signed: yes  Is patient diabetic? no    Pregnant: no LMP: No LMP recorded. Patient is not currently having periods (Reason: IUD). (age 50-55)  Anticoagulants: no Anti-inflammatory: no Antibiotics: no  Procedure: Right Medial Branch Block Position: Prone Start Time: 10:57 End Time: 11:02 Fluoro Time: 16 seconds  RN/CMA Levens,CMA Shumaker RN    Time 1037am 11:07    BP 137/93 140/78    Pulse 85 91    Respirations 14 14    O2 Sat 99 100    S/S 6 6    Pain Level 5/10 2/10     D/C home with [name], patient A & O X 3, D/C instructions reviewed, and sits independently.

## 2012-01-14 NOTE — Progress Notes (Signed)
  Subjective:    Patient ID: Brenda [name], female    DOB: [birth year], 51 y.o.   MRN: [id number]  HPI    Review of Systems     Objective:   Physical Exam        Assessment & Plan:  Right lumbar L3, L4 medial branch blocks and L5 dorsal ramus injection under fluoroscopic guidance  Indication: Right Lumbar pain which is not relieved by medication management or other conservative care and interfering with self-care and mobility.  Informed consent was obtained after describing risks and benefits of the procedure with the patient, this includes bleeding, bruising, infection, paralysis and medication side effects. The patient wishes to proceed and has given written consent. The patient was placed in a prone position. The lumbar area was marked and prepped with Betadine. One ML of 1% lidocaine was injected into each of 3 areas into the skin and subcutaneous tissue. Then a 22-gauge 3.5 spinal needle was inserted targeting the junction of the Right S1 superior articular process and sacral ala junction. Needle was advanced under fluoroscopic guidance. Bone contact was made. Omnipaque 180 was injected x0.5 mL demonstrating no intravascular uptake. Then a solution containing one ML of 4 mg per mL dexamethasone and 3 mL of 2% MPF lidocaine was injected x0.5 mL. Then the Right L5 superior articular process in transverse process junction was targeted. Bone contact was made. Omnipaque 180 was injected x0.5 mL demonstrating no intravascular uptake. Then a solution containing one ML of 4 mg per mL dexamethasone and 3 mL of 2% MPF lidocaine was injected x0.5 mL. Then the Right L4 superior articular process in transverse process junction was targeted. Bone contact was made. Omnipaque 180 was injected x0.5 mL demonstrating no intravascular uptake. Then a solution containing one ML of 4 mg per mL dexamethasone and 3 mL of 2% MPF lidocaine was injected x0.5 mL Patient tolerated procedure well. Post procedure  instructions were given. Please refer to post procedure form.

## 2012-01-15 NOTE — Addendum Note (Signed)
Addended by: Bufford Spikes on: 01/15/2012 11:31 AM   Modules accepted: Orders

## 2012-01-30 IMAGING — CR DG CHEST 1V PORT
2 series · 2 of 2 positions shown · non-contrast
Comparison: Chest radiograph performed 06/24/2009

CLINICAL DATA: Chest pain.

PORTABLE CHEST - 1 VIEW

[AP (1 of 2)]
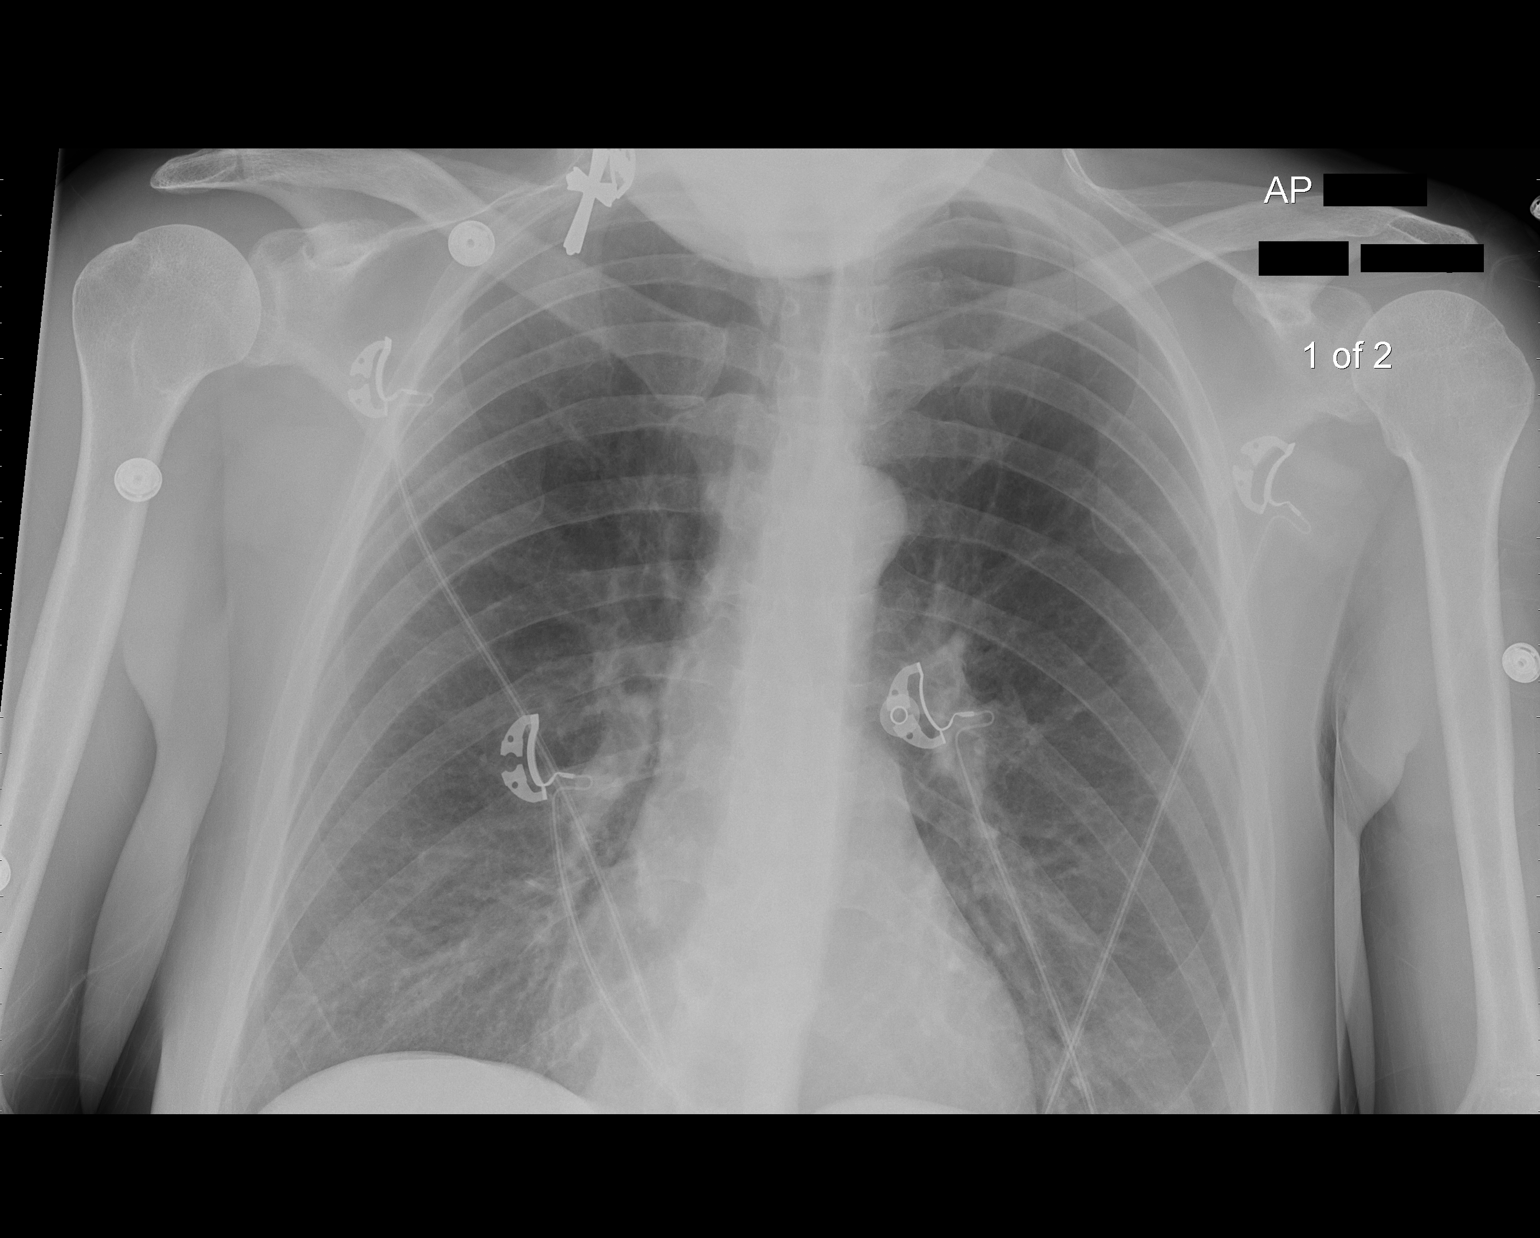

[AP (2 of 2)]
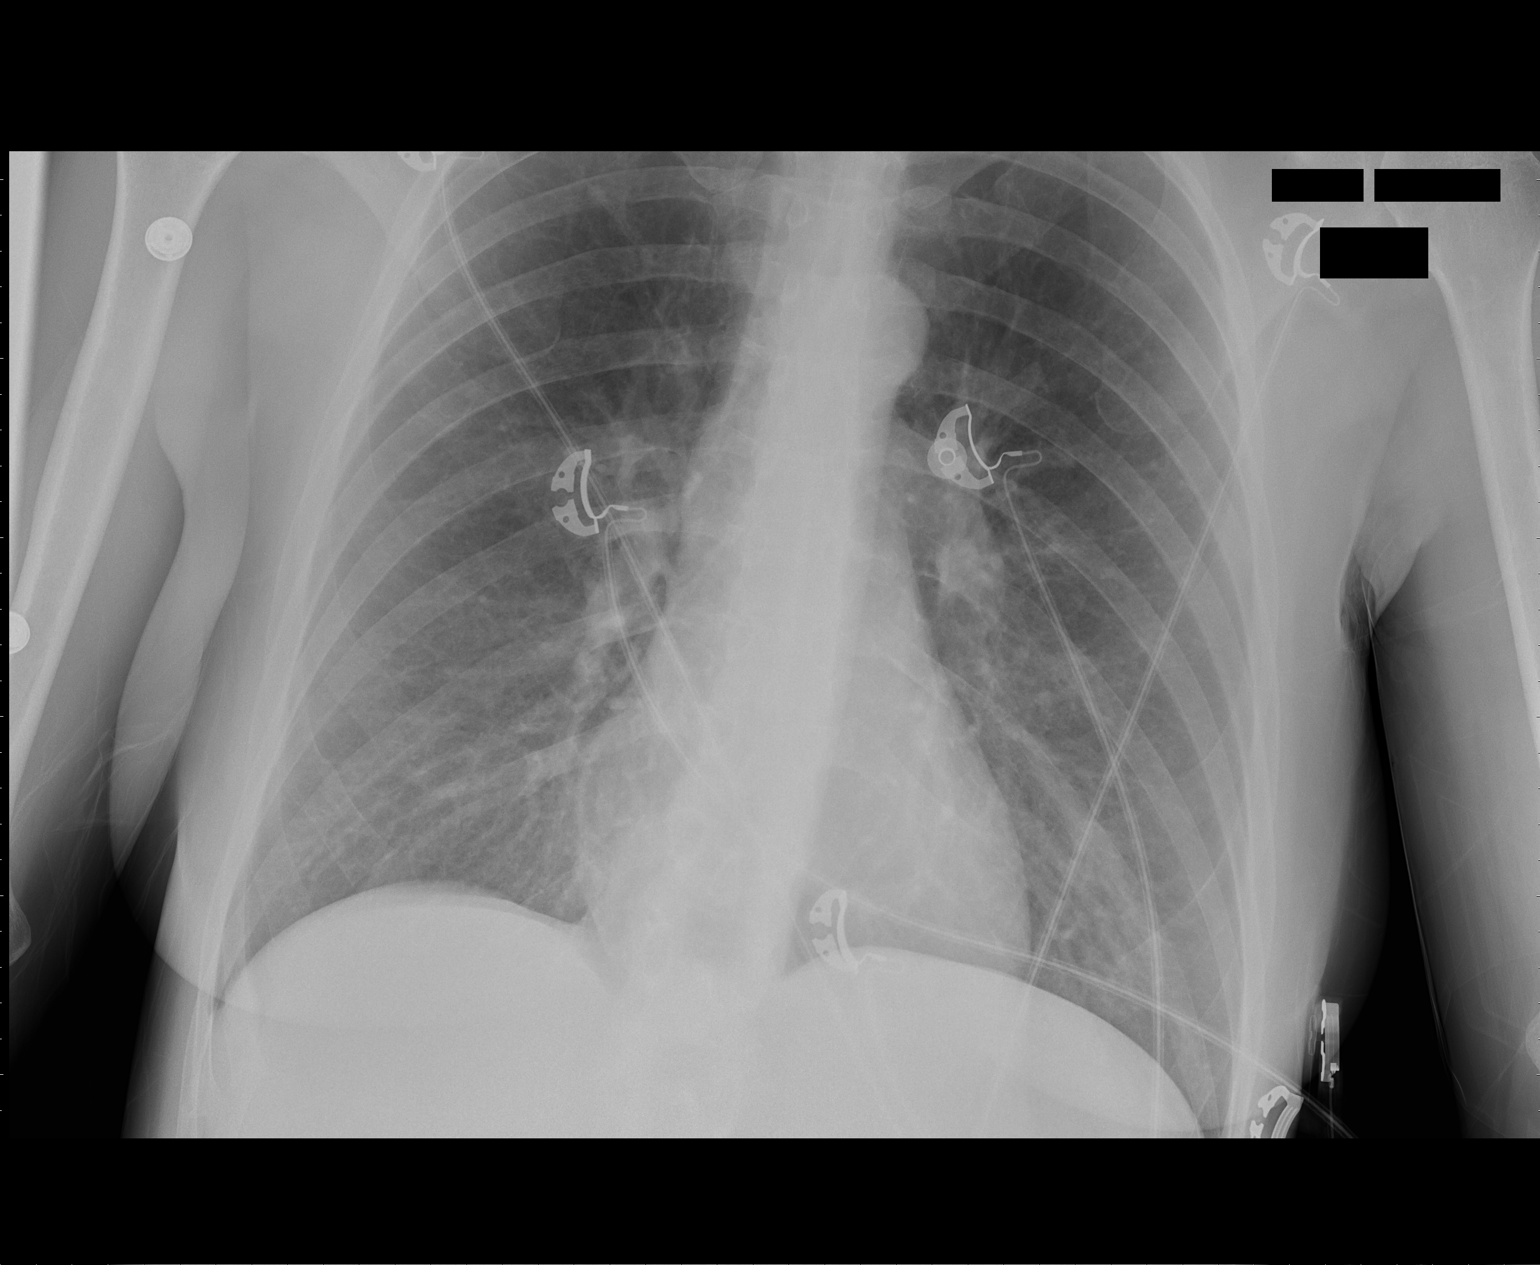

[2 of 2 positions shown; findings below may reference images not displayed]

FINDINGS: The lungs are well-aerated and clear.  Interstitial
markings are borderline prominent.  No vascular congestion is seen.
There is no evidence of focal opacification, pleural effusion or
pneumothorax.

The cardiomediastinal silhouette is within normal limits.  No acute
osseous abnormalities are seen.
IMPRESSION: No acute cardiopulmonary process identified.

## 2012-02-03 ENCOUNTER — Telehealth: Payer: Self-pay | Admitting: Physical Medicine & Rehabilitation

## 2012-02-03 NOTE — Telephone Encounter (Signed)
CLARIFICATION ON METHOCARBAMOL.

## 2012-02-03 NOTE — Telephone Encounter (Signed)
Clarified robaxin dose with [facility].

## 2012-02-11 ENCOUNTER — Encounter: Payer: Self-pay | Admitting: Physical Medicine & Rehabilitation

## 2012-02-11 ENCOUNTER — Ambulatory Visit (HOSPITAL_BASED_OUTPATIENT_CLINIC_OR_DEPARTMENT_OTHER): Payer: Self-pay | Admitting: Physical Medicine & Rehabilitation

## 2012-02-11 VITALS — BP 133/63 | HR 77 | Resp 14 | Ht 63.0 in | Wt 129.0 lb

## 2012-02-11 DIAGNOSIS — M5430 Sciatica, unspecified side: Secondary | ICD-10-CM

## 2012-02-11 MED ORDER — AMITRIPTYLINE HCL 25 MG PO TABS: 25.0000 mg | ORAL_TABLET | Freq: Every day | ORAL | Status: DC

## 2012-02-11 NOTE — Progress Notes (Signed)
Lumbar transforaminal Right L5epidural steroid injection under fluoroscopic guidance  Indication: Lumbosacral radiculitis is not relieved by medication management or other conservative care and interfering with self-care and mobility.   Informed consent was obtained after describing risk and benefits of the procedure with the patient, this includes bleeding, bruising, infection, paralysis and medication side effects.  The patient wishes to proceed and has given written consent.  Patient was placed in prone position.  The lumbar area was marked and prepped with Betadine.  It was entered with a 25-gauge 1-1/2 inch needle and one mL of 1% lidocaine was injected into the skin and subcutaneous tissue.  Then a 22-gauge 3.5  spinal needle was inserted into the R L5-S1 intervertebral foramen under AP, lateral, and oblique view.  Then a solution containing one mL of 10 mg per mL dexamethasone and 2 mL of 1% lidocaine was injected.  The patient tolerated procedure well.  Post procedure instructions were given.  Please see post procedure form.

## 2012-02-11 NOTE — Progress Notes (Signed)
  PROCEDURE RECORD [facility]   Name: DEDE DOBESH DOB:Nov 13, 1960 MRN: [id number]  Date:02/11/2012  Physician: Claudette Laws, MD    Nurse/CMA: Kelli Churn, CMA  Allergies: No Known Allergies  Consent Signed: yes  Is patient diabetic? no    Pregnant: no LMP: No LMP recorded. Patient is not currently having periods (Reason: IUD). (age 50-55)  Anticoagulants: no Anti-inflammatory: no Antibiotics: no  Procedure: Transformainal Injection  Position: Prone Start Time: 1030  End Time:  1036 Fluoro Time: 14  RN/CMA Levens,CMA Levens, CMA    Time 1010am 1038am    BP 133/63 125/68    Pulse 77 66    Respirations 14 14    O2 Sat 98 100    S/S 6 6    Pain Level 5/10 3/10     D/C home with Janene Harvey, patient A & O X 3, D/C instructions reviewed, and sits independently.

## 2012-02-11 NOTE — Patient Instructions (Signed)
We did the right L5 nerve root today We must wait 3 months until another spine injection If your pain worsens over the next month or 2 call me and I will refer you to a surgeon

## 2012-02-16 IMAGING — CR DG CHEST 2V
2 series · 2 of 2 positions shown · non-contrast
Comparison: 09/03/2009.

CLINICAL DATA: Chest pain.  History of myocardial infarction and
coronary artery disease.

CHEST - 2 VIEW

[w chest pa]
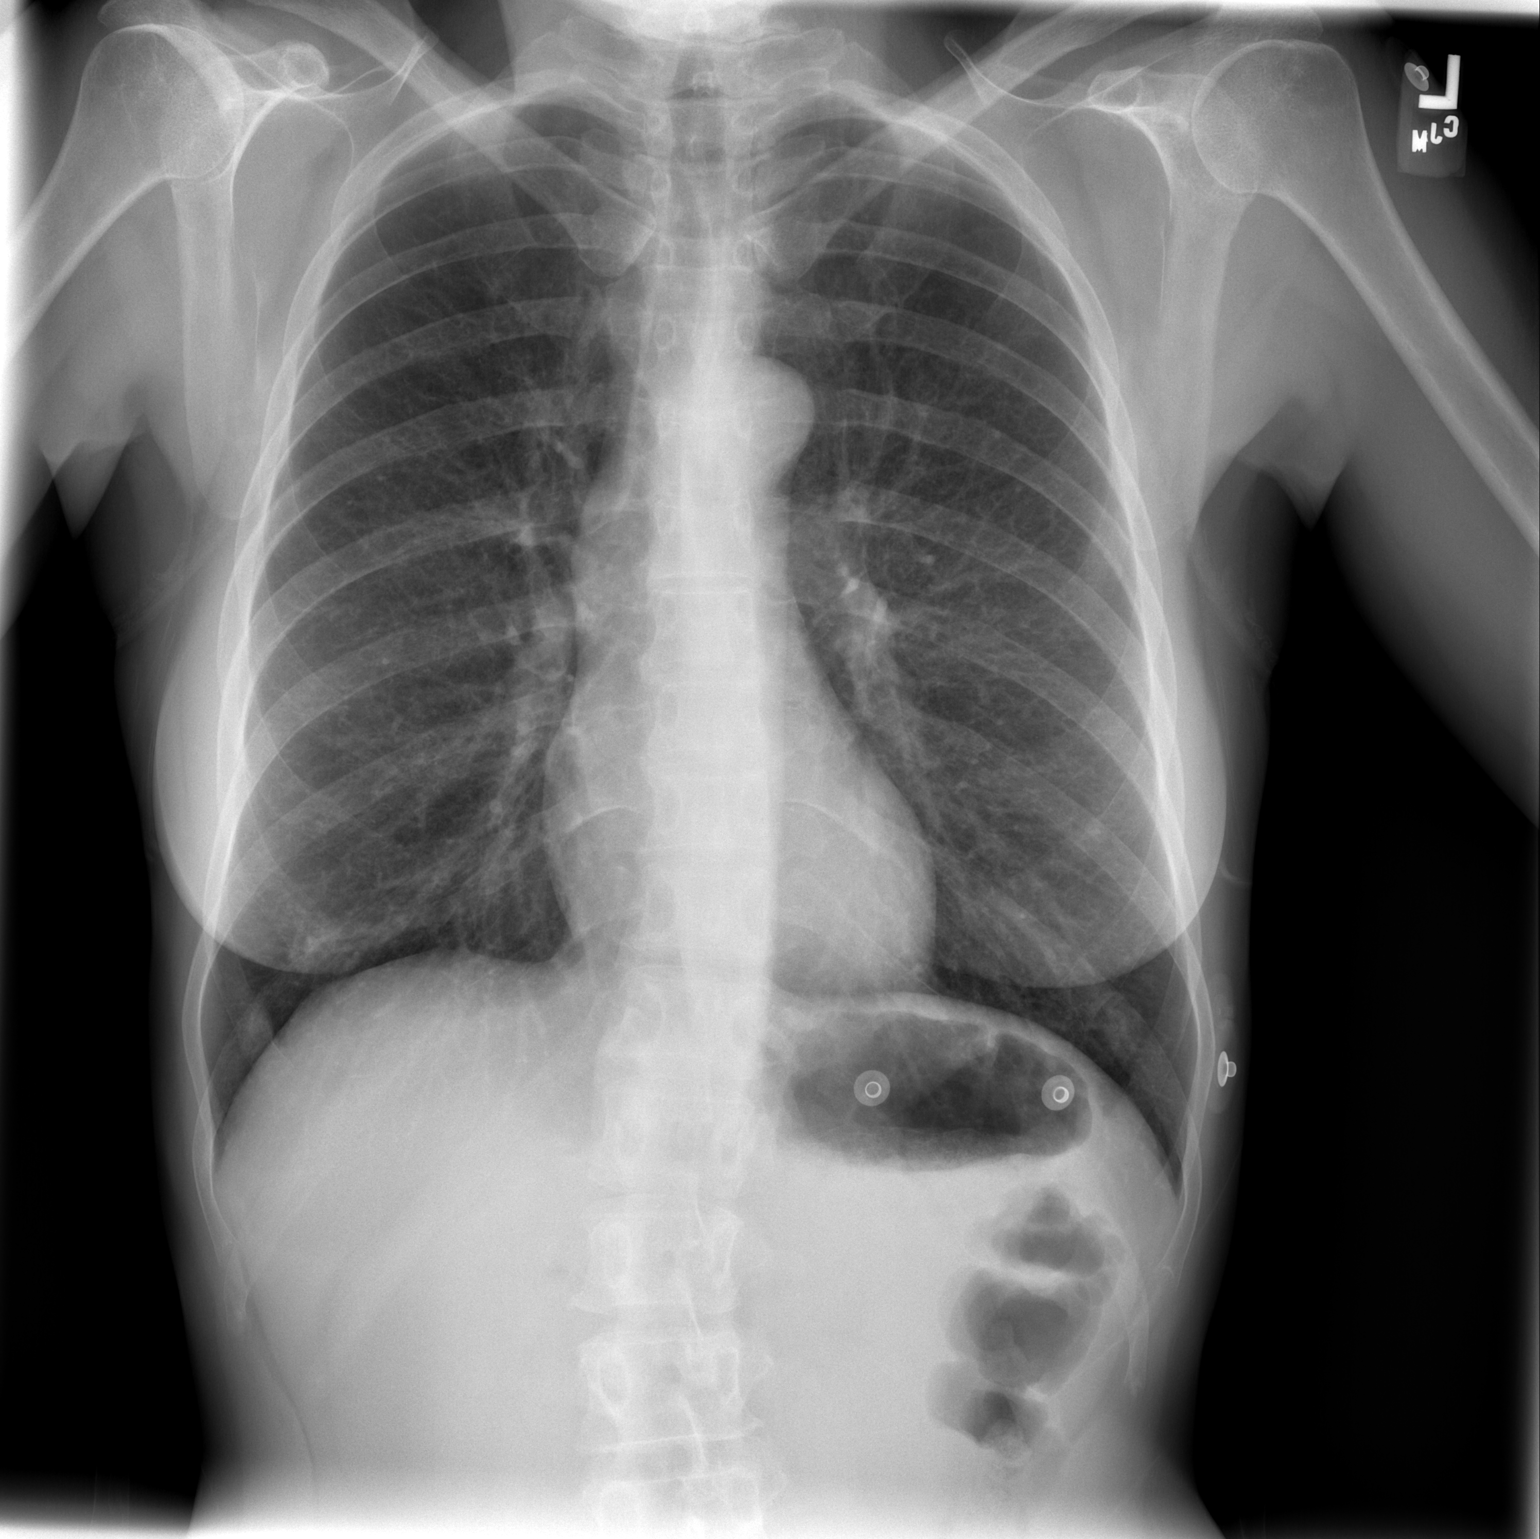

[w chest lat]
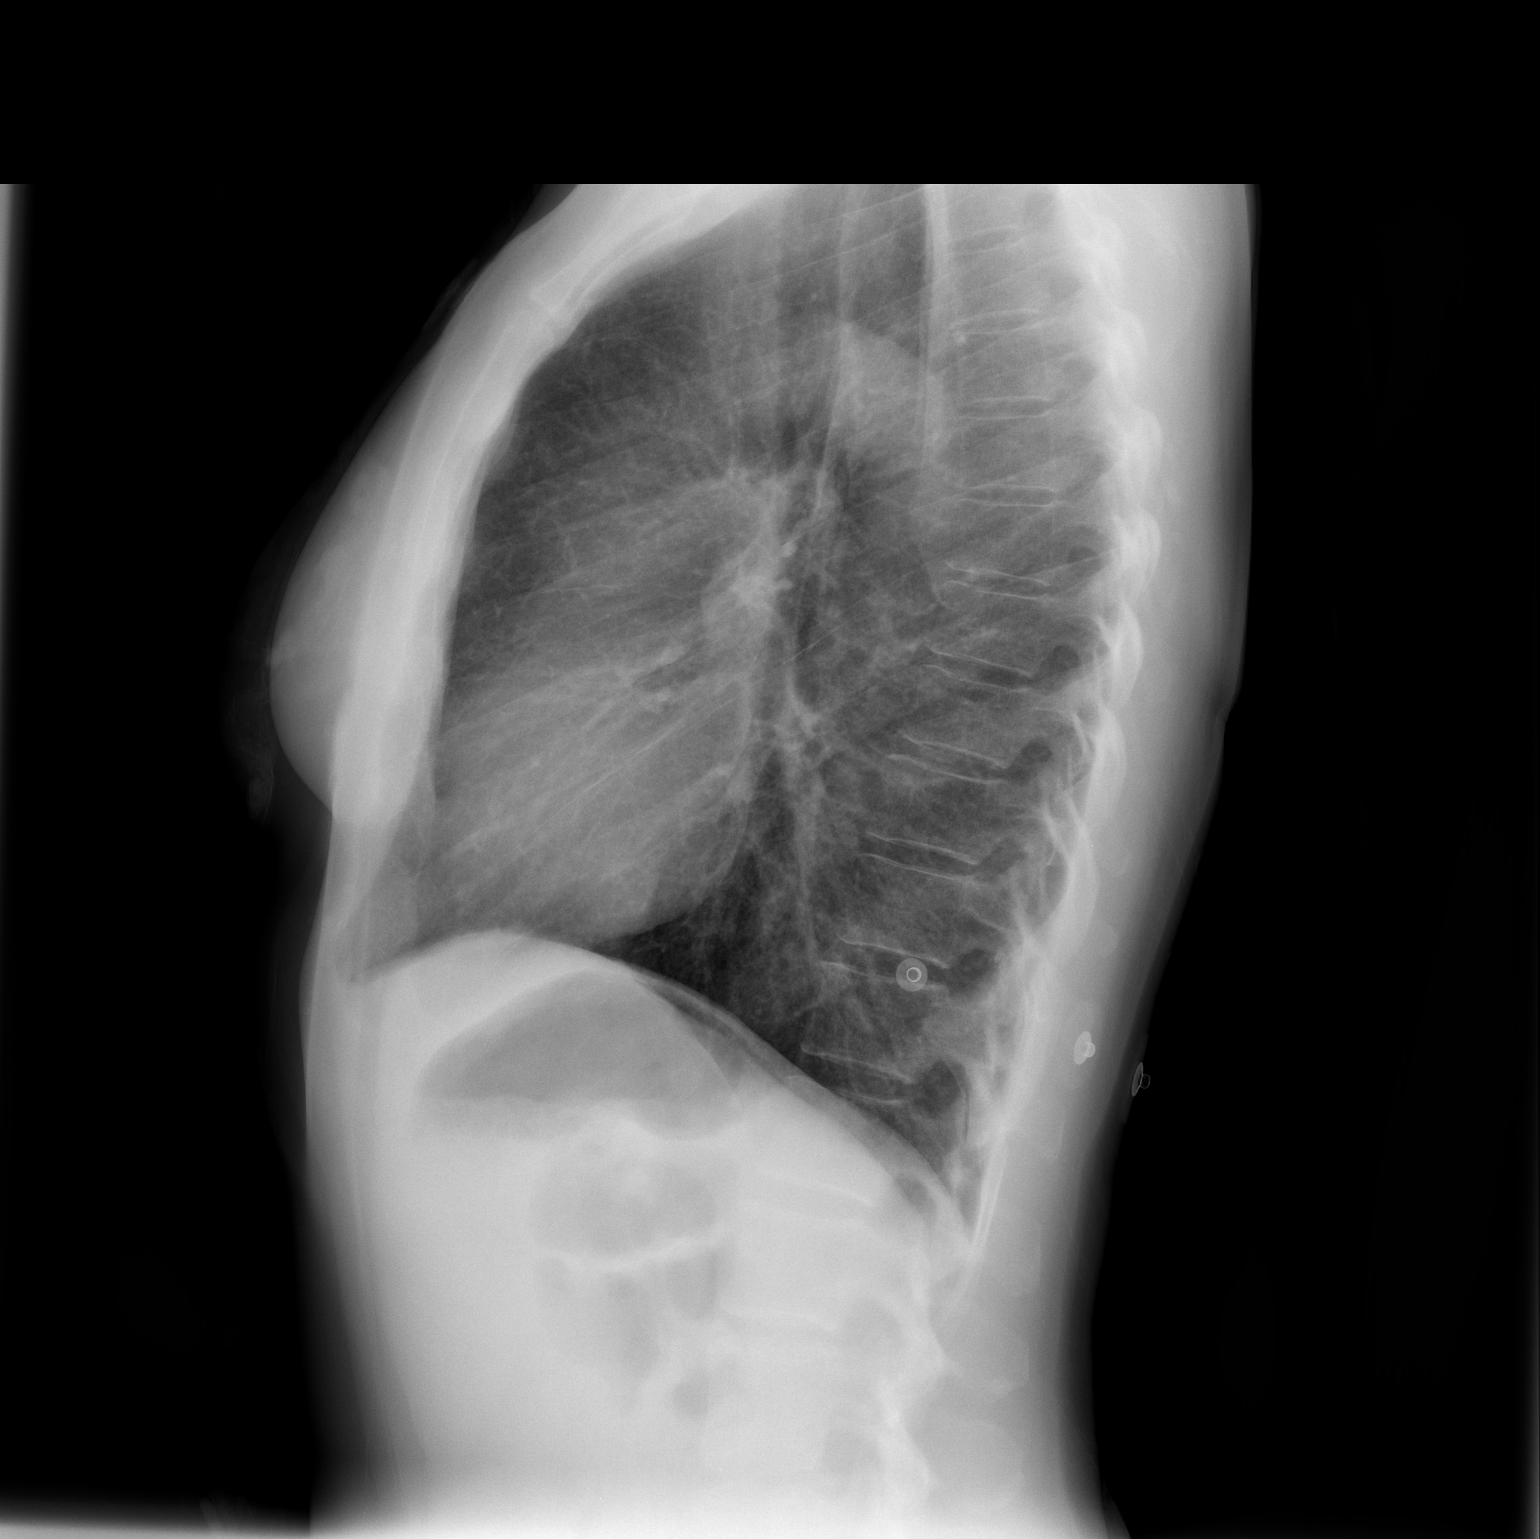

[2 of 2 positions shown; findings below may reference images not displayed]

FINDINGS: Small focus of patchy airspace disease is present over
the right hemidiaphragm.  This is in the right lower lobe on the
lateral view, most consistent with early pneumonia.
Cardiopericardial silhouette within normal limits for size.  Mild
tortuosity of the thoracic aorta. Follow-up to ensure radiographic
clearing recommended.  Clearing is usually observed at 4-6 weeks.
IMPRESSION: Small area of patchy airspace disease over the right
hemidiaphragm/right lower lobe consistent with early right lower
lobe pneumonia.

## 2012-02-17 IMAGING — CR DG CHEST 2V
2 series · 2 of 2 positions shown · non-contrast
Comparison: 09/20/2009 and 09/03/2009

CLINICAL DATA: Chest pain and chest congestion.  Pneumonia.

CHEST - 2 VIEW

[w chest pa]
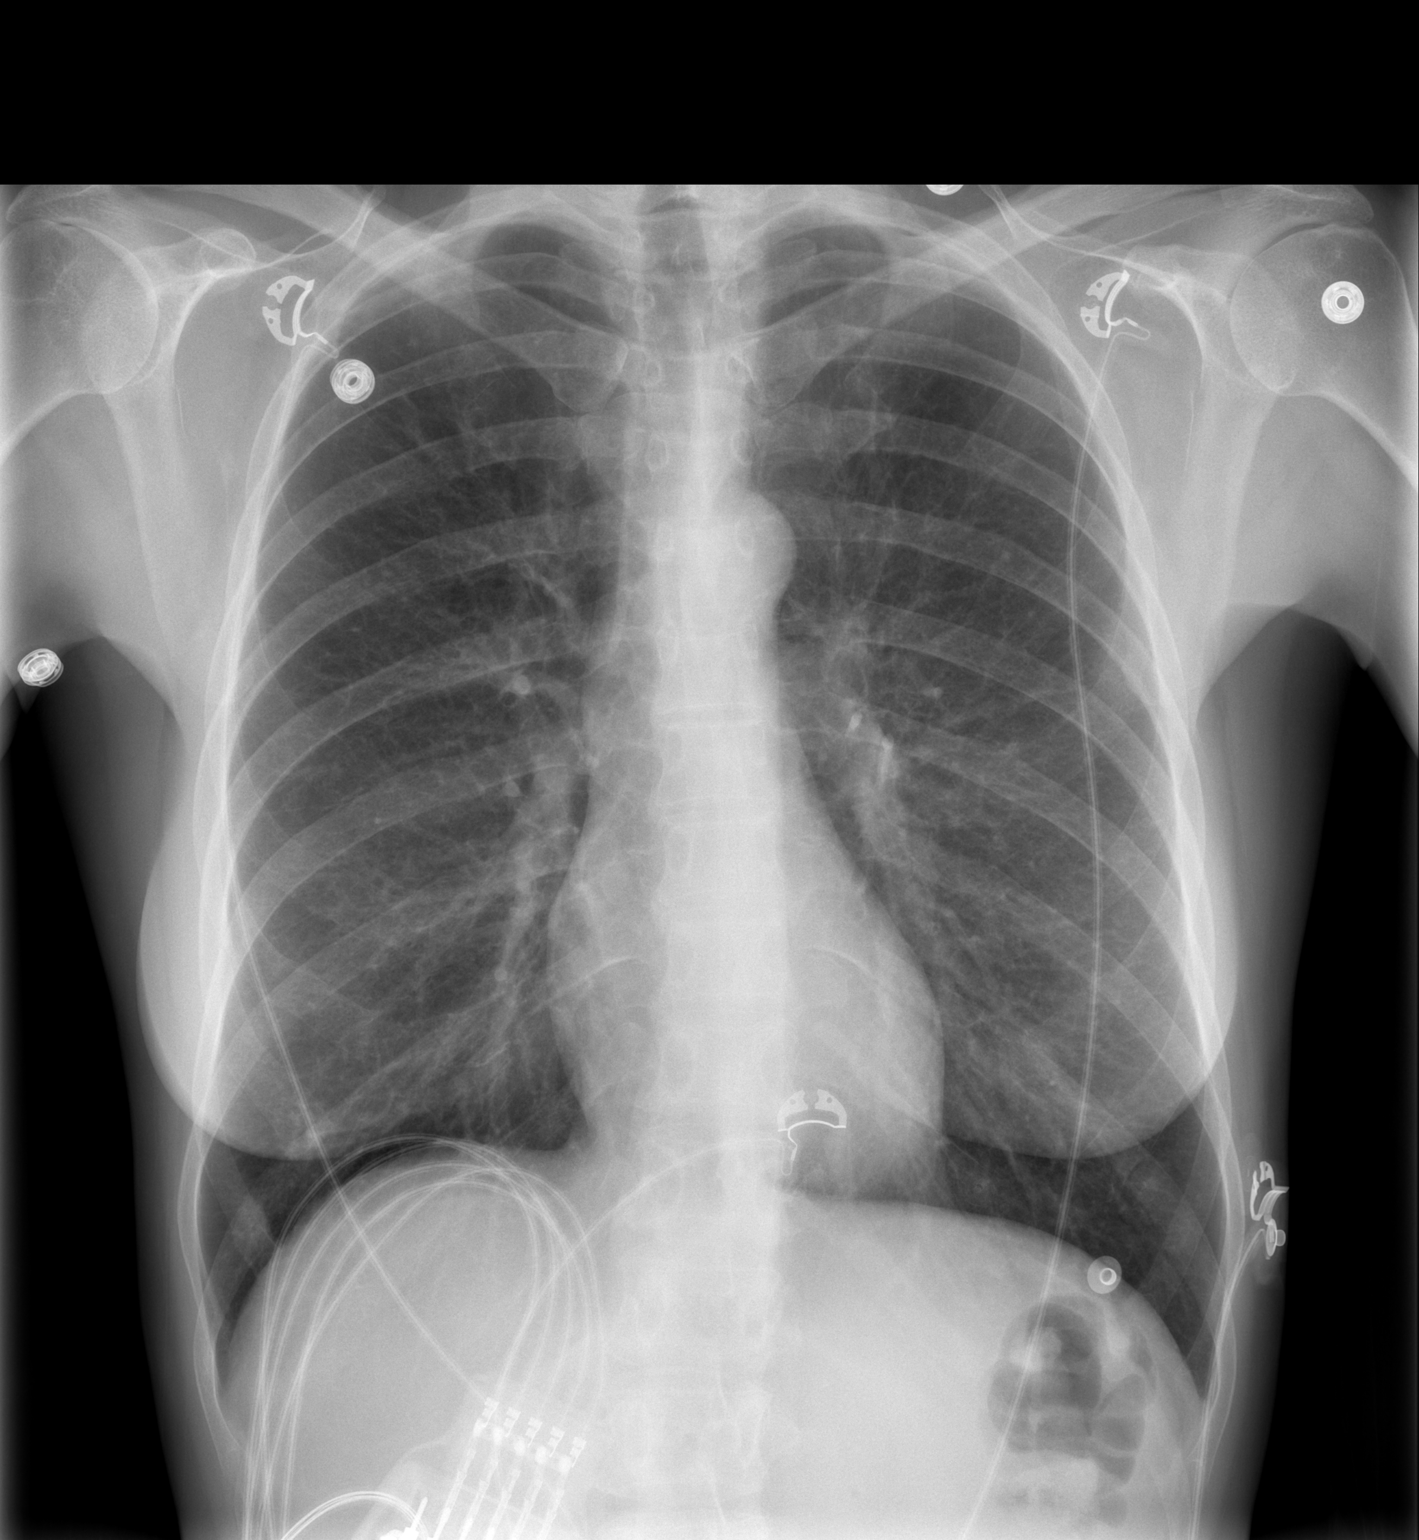

[w chest lat]
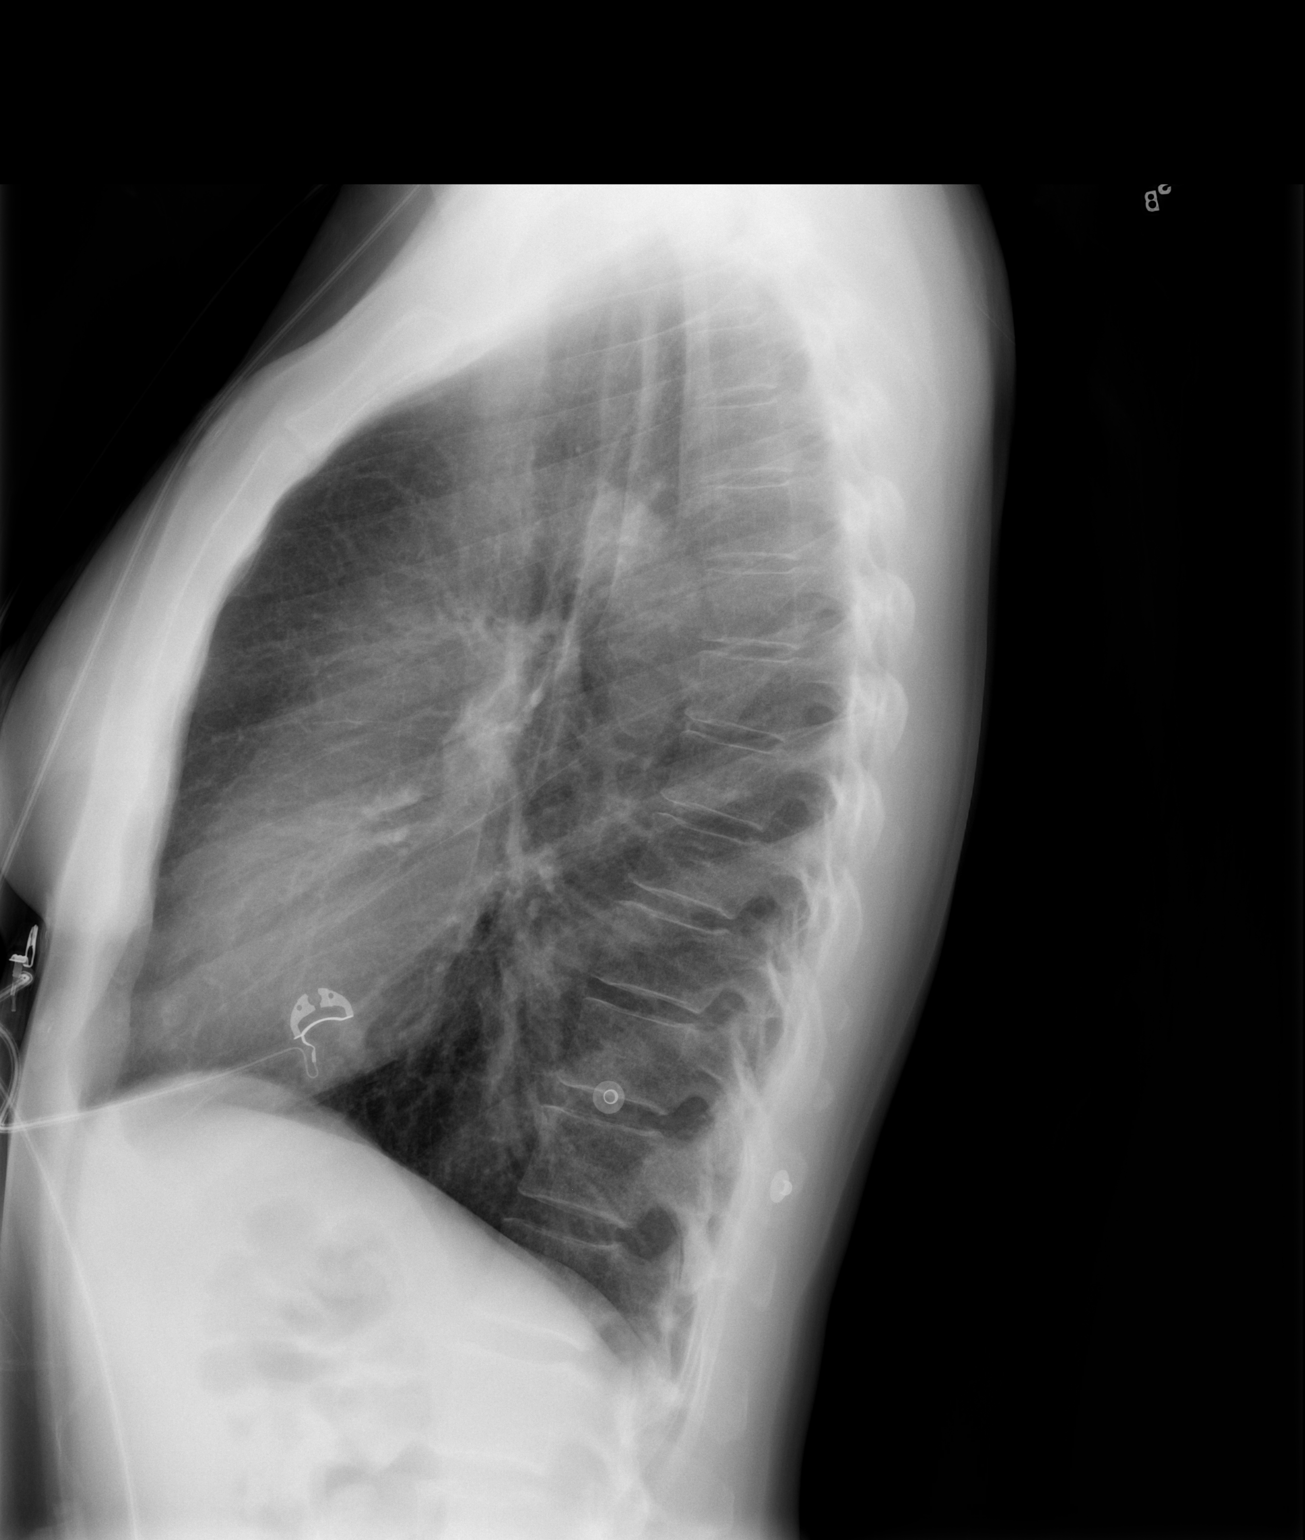

[2 of 2 positions shown; findings below may reference images not displayed]

FINDINGS: The heart size and vascularity are normal.  The vague
area of density seen at the right lung base on the prior study is
demonstrated be a callus formation from a prior rib fracture.
There are other old adjacent  right anterior lower rib fractures
with callus formation.

There are no discrete infiltrates or effusions.  No acute bony
abnormality.
IMPRESSION: No acute disease.  The density at the right lung base noted on the
prior study is demonstrated to be caused by callus formation from
prior right lower anterior rib fractures.

## 2012-03-19 ENCOUNTER — Emergency Department (HOSPITAL_COMMUNITY)
Admission: EM | Admit: 2012-03-19 | Discharge: 2012-03-20 | Disposition: A | Discharge status: Home / Self Care | Payer: Self-pay | Attending: Emergency Medicine | Admitting: Emergency Medicine

## 2012-03-19 ENCOUNTER — Emergency Department (HOSPITAL_COMMUNITY): Payer: Self-pay

## 2012-03-19 ENCOUNTER — Encounter (HOSPITAL_COMMUNITY): Payer: Self-pay | Admitting: Emergency Medicine

## 2012-03-19 DIAGNOSIS — F1410 Cocaine abuse, uncomplicated: Secondary | ICD-10-CM | POA: Insufficient documentation

## 2012-03-19 DIAGNOSIS — M545 Low back pain, unspecified: Secondary | ICD-10-CM | POA: Insufficient documentation

## 2012-03-19 DIAGNOSIS — E785 Hyperlipidemia, unspecified: Secondary | ICD-10-CM | POA: Insufficient documentation

## 2012-03-19 DIAGNOSIS — I252 Old myocardial infarction: Secondary | ICD-10-CM | POA: Insufficient documentation

## 2012-03-19 DIAGNOSIS — Z8719 Personal history of other diseases of the digestive system: Secondary | ICD-10-CM | POA: Insufficient documentation

## 2012-03-19 DIAGNOSIS — Z8673 Personal history of transient ischemic attack (TIA), and cerebral infarction without residual deficits: Secondary | ICD-10-CM | POA: Insufficient documentation

## 2012-03-19 DIAGNOSIS — Z79899 Other long term (current) drug therapy: Secondary | ICD-10-CM | POA: Insufficient documentation

## 2012-03-19 DIAGNOSIS — I10 Essential (primary) hypertension: Secondary | ICD-10-CM | POA: Insufficient documentation

## 2012-03-19 DIAGNOSIS — I2510 Atherosclerotic heart disease of native coronary artery without angina pectoris: Secondary | ICD-10-CM | POA: Insufficient documentation

## 2012-03-19 DIAGNOSIS — F1210 Cannabis abuse, uncomplicated: Secondary | ICD-10-CM | POA: Insufficient documentation

## 2012-03-19 DIAGNOSIS — F329 Major depressive disorder, single episode, unspecified: Secondary | ICD-10-CM | POA: Insufficient documentation

## 2012-03-19 DIAGNOSIS — Z8679 Personal history of other diseases of the circulatory system: Secondary | ICD-10-CM | POA: Insufficient documentation

## 2012-03-19 DIAGNOSIS — F411 Generalized anxiety disorder: Secondary | ICD-10-CM | POA: Insufficient documentation

## 2012-03-19 DIAGNOSIS — T148XXA Other injury of unspecified body region, initial encounter: Secondary | ICD-10-CM

## 2012-03-19 DIAGNOSIS — G8929 Other chronic pain: Secondary | ICD-10-CM | POA: Insufficient documentation

## 2012-03-19 DIAGNOSIS — F3289 Other specified depressive episodes: Secondary | ICD-10-CM | POA: Insufficient documentation

## 2012-03-19 DIAGNOSIS — Z87448 Personal history of other diseases of urinary system: Secondary | ICD-10-CM | POA: Insufficient documentation

## 2012-03-19 DIAGNOSIS — F17200 Nicotine dependence, unspecified, uncomplicated: Secondary | ICD-10-CM | POA: Insufficient documentation

## 2012-03-19 DIAGNOSIS — Z7982 Long term (current) use of aspirin: Secondary | ICD-10-CM | POA: Insufficient documentation

## 2012-03-19 DIAGNOSIS — S20229A Contusion of unspecified back wall of thorax, initial encounter: Secondary | ICD-10-CM | POA: Insufficient documentation

## 2012-03-19 DIAGNOSIS — Z8781 Personal history of (healed) traumatic fracture: Secondary | ICD-10-CM | POA: Insufficient documentation

## 2012-03-19 MED ORDER — HYDROMORPHONE HCL PF 1 MG/ML IJ SOLN: 1.0000 mg | Freq: Once | INTRAMUSCULAR | Status: DC

## 2012-03-19 MED ORDER — IBUPROFEN 600 MG PO TABS: 600.0000 mg | ORAL_TABLET | Freq: Four times a day (QID) | ORAL | Status: DC | PRN

## 2012-03-19 MED ORDER — SODIUM CHLORIDE 0.9 % IV BOLUS (SEPSIS): 1000.0000 mL | Freq: Once | INTRAVENOUS | Status: DC

## 2012-03-19 MED ORDER — OXYCODONE-ACETAMINOPHEN 5-325 MG PO TABS
2.0000 | ORAL_TABLET | Freq: Once | ORAL | Status: AC
  Administered 2012-03-19: 2 via ORAL
  Filled 2012-03-19 (×2): qty 2

## 2012-03-19 MED ORDER — FENTANYL CITRATE 0.05 MG/ML IJ SOLN
50.0000 ug | Freq: Once | INTRAMUSCULAR | Status: AC
  Administered 2012-03-19: 50 ug via INTRAVENOUS
  Filled 2012-03-19: qty 2

## 2012-03-19 MED ORDER — KETOROLAC TROMETHAMINE 30 MG/ML IJ SOLN: 30.0000 mg | Freq: Once | INTRAMUSCULAR | Status: DC

## 2012-03-19 MED ORDER — SODIUM CHLORIDE 0.9 % IV SOLN
Freq: Once | INTRAVENOUS | Status: AC
Start: 2012-03-19 — End: 2012-03-19
  Administered 2012-03-19: 100 mL/h via INTRAVENOUS
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
## 2012-03-19 NOTE — ED Notes (Signed)
Pt log rolled with assistance of EMS personnel, Cspine maintained.
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
## 2012-03-19 NOTE — ED Notes (Signed)
Bed:WA15<BR> Expected date:<BR> Expected time:<BR> Means of arrival:<BR> Comments:<BR> EMS

## 2012-03-29 ENCOUNTER — Telehealth: Payer: Self-pay

## 2012-03-29 ENCOUNTER — Other Ambulatory Visit: Payer: Self-pay | Admitting: *Deleted

## 2012-03-29 MED ORDER — OMEPRAZOLE 40 MG PO CPDR: 40.0000 mg | DELAYED_RELEASE_CAPSULE | Freq: Every day | ORAL | Status: DC

## 2012-03-29 NOTE — Telephone Encounter (Signed)
LM for patient to call back to discuss refill request. 

## 2012-03-29 NOTE — Telephone Encounter (Signed)
LM for patient to call back to discuss refill request.

## 2012-03-29 NOTE — Telephone Encounter (Signed)
Message copied by [place], Maryn Freelove E on Tue Mar 29, 2012  3:20 PM ------      Message from: Iva Boop      Created: Tue Mar 29, 2012  3:13 PM       If she thinks this is working well and she is ok then can refill for 1 year            I had intended for REV but she did not schedule nor did she do stool tests - if still having problems needs to complete testing, schedule REV and refill x 2 only                  ----- Message -----         From: Mazzie Brodrick E [place], CMA         Sent: 03/29/2012   3:00 PM           To: Iva Boop, MD            Had colonoscopy done 11/19/11, you rx'ed dicyclomine #120 and 1 RF.  She is requesting refill, is that ok Sir?

## 2012-03-30 NOTE — Telephone Encounter (Signed)
Rx faxed in.

## 2012-03-31 ENCOUNTER — Other Ambulatory Visit: Payer: Self-pay | Admitting: *Deleted

## 2012-03-31 MED ORDER — AMITRIPTYLINE HCL 25 MG PO TABS: 25.0000 mg | ORAL_TABLET | Freq: Every day | ORAL | Status: DC

## 2012-04-01 NOTE — Telephone Encounter (Signed)
LM on patients home # to call me back to discuss refill.  Left our hours on the message.

## 2012-04-11 NOTE — Telephone Encounter (Signed)
LM on home# to call me back to discuss refill request for the Dicyclomine.

## 2012-04-25 NOTE — Telephone Encounter (Signed)
Mailed patient a letter since unable to reach her by phone to discuss her refill of her dicyclomine.

## 2012-04-26 NOTE — Addendum Note (Signed)
Addended by: Bufford Spikes on: 04/26/2012 09:28 AM   Modules accepted: Orders

## 2012-05-05 ENCOUNTER — Encounter: Payer: No Typology Code available for payment source | Attending: Physical Medicine & Rehabilitation

## 2012-05-05 ENCOUNTER — Encounter: Payer: Self-pay | Admitting: Physical Medicine & Rehabilitation

## 2012-05-05 ENCOUNTER — Ambulatory Visit (HOSPITAL_BASED_OUTPATIENT_CLINIC_OR_DEPARTMENT_OTHER): Payer: No Typology Code available for payment source | Admitting: Physical Medicine & Rehabilitation

## 2012-05-05 VITALS — BP 173/94 | HR 88 | Resp 14 | Ht 63.0 in | Wt 126.2 lb

## 2012-05-05 DIAGNOSIS — IMO0002 Reserved for concepts with insufficient information to code with codable children: Secondary | ICD-10-CM | POA: Insufficient documentation

## 2012-05-05 DIAGNOSIS — M5430 Sciatica, unspecified side: Secondary | ICD-10-CM

## 2012-05-05 NOTE — Patient Instructions (Addendum)
We did R L4 nerve block Next one in 3 months

## 2012-05-05 NOTE — Progress Notes (Signed)
Lumbar transforaminal Right L4 epidural steroid injection under fluoroscopic guidance  Indication: Lumbosacral radiculitis is not relieved by medication management or other conservative care and interfering with self-care and mobility.   Informed consent was obtained after describing risk and benefits of the procedure with the patient, this includes bleeding, bruising, infection, paralysis and medication side effects.  The patient wishes to proceed and has given written consent.  Patient was placed in prone position.  The lumbar area was marked and prepped with Betadine.  It was entered with a 25-gauge 1-1/2 inch needle and one mL of 1% lidocaine was injected into the skin and subcutaneous tissue.  Then a 22-gauge 3.5  spinal needle was inserted into the RL4- L5 intervertebral foramen under AP, lateral, and oblique view.  Then a solution containing one mL of 10 mg per mL dexamethasone and 2 mL of 1% lidocaine was injected.  The patient tolerated procedure well.  Post procedure instructions were given.  Please see post procedure form.

## 2012-05-05 NOTE — Progress Notes (Signed)
  PROCEDURE RECORD [facility]   Name: [name] DOB:[birth year] MRN: [id number]  Date:05/05/2012  Physician: Claudette Laws, MD    Nurse/CMA: Shumaker RN  Allergies: No Known Allergies  Consent Signed: yes  Is patient diabetic? no  CBG today?   Pregnant: no LMP: No LMP recorded. Patient is not currently having periods (Reason: IUD). (age 52-55)  Anticoagulants: no Anti-inflammatory: no Antibiotics: no  Procedure: right limbar 4 transforaminal epidural steroid injection Position: Prone Start Time: 10:16 End Time: 10:23 Fluoro Time: 21seconds  RN/CMA Haematologist RN    Time 9:33 10:26    BP 173/94 184/71    Pulse 88 85    Respirations 14 14    O2 Sat 98 100    S/S 6 6    Pain Level 5/10 2/10     D/C home with [name], patient A & O X 3, D/C instructions reviewed, and sits independently.

## 2012-05-09 ENCOUNTER — Other Ambulatory Visit: Payer: Self-pay | Admitting: Internal Medicine

## 2012-05-09 IMAGING — CR DG CHEST 2V
2 series · 2 of 2 positions shown · non-contrast
Comparison: 09/30/2009

CLINICAL DATA: Chest pain radiating to left arm and back.
Shortness of breath.

CHEST - 2 VIEW

[w chest pa]
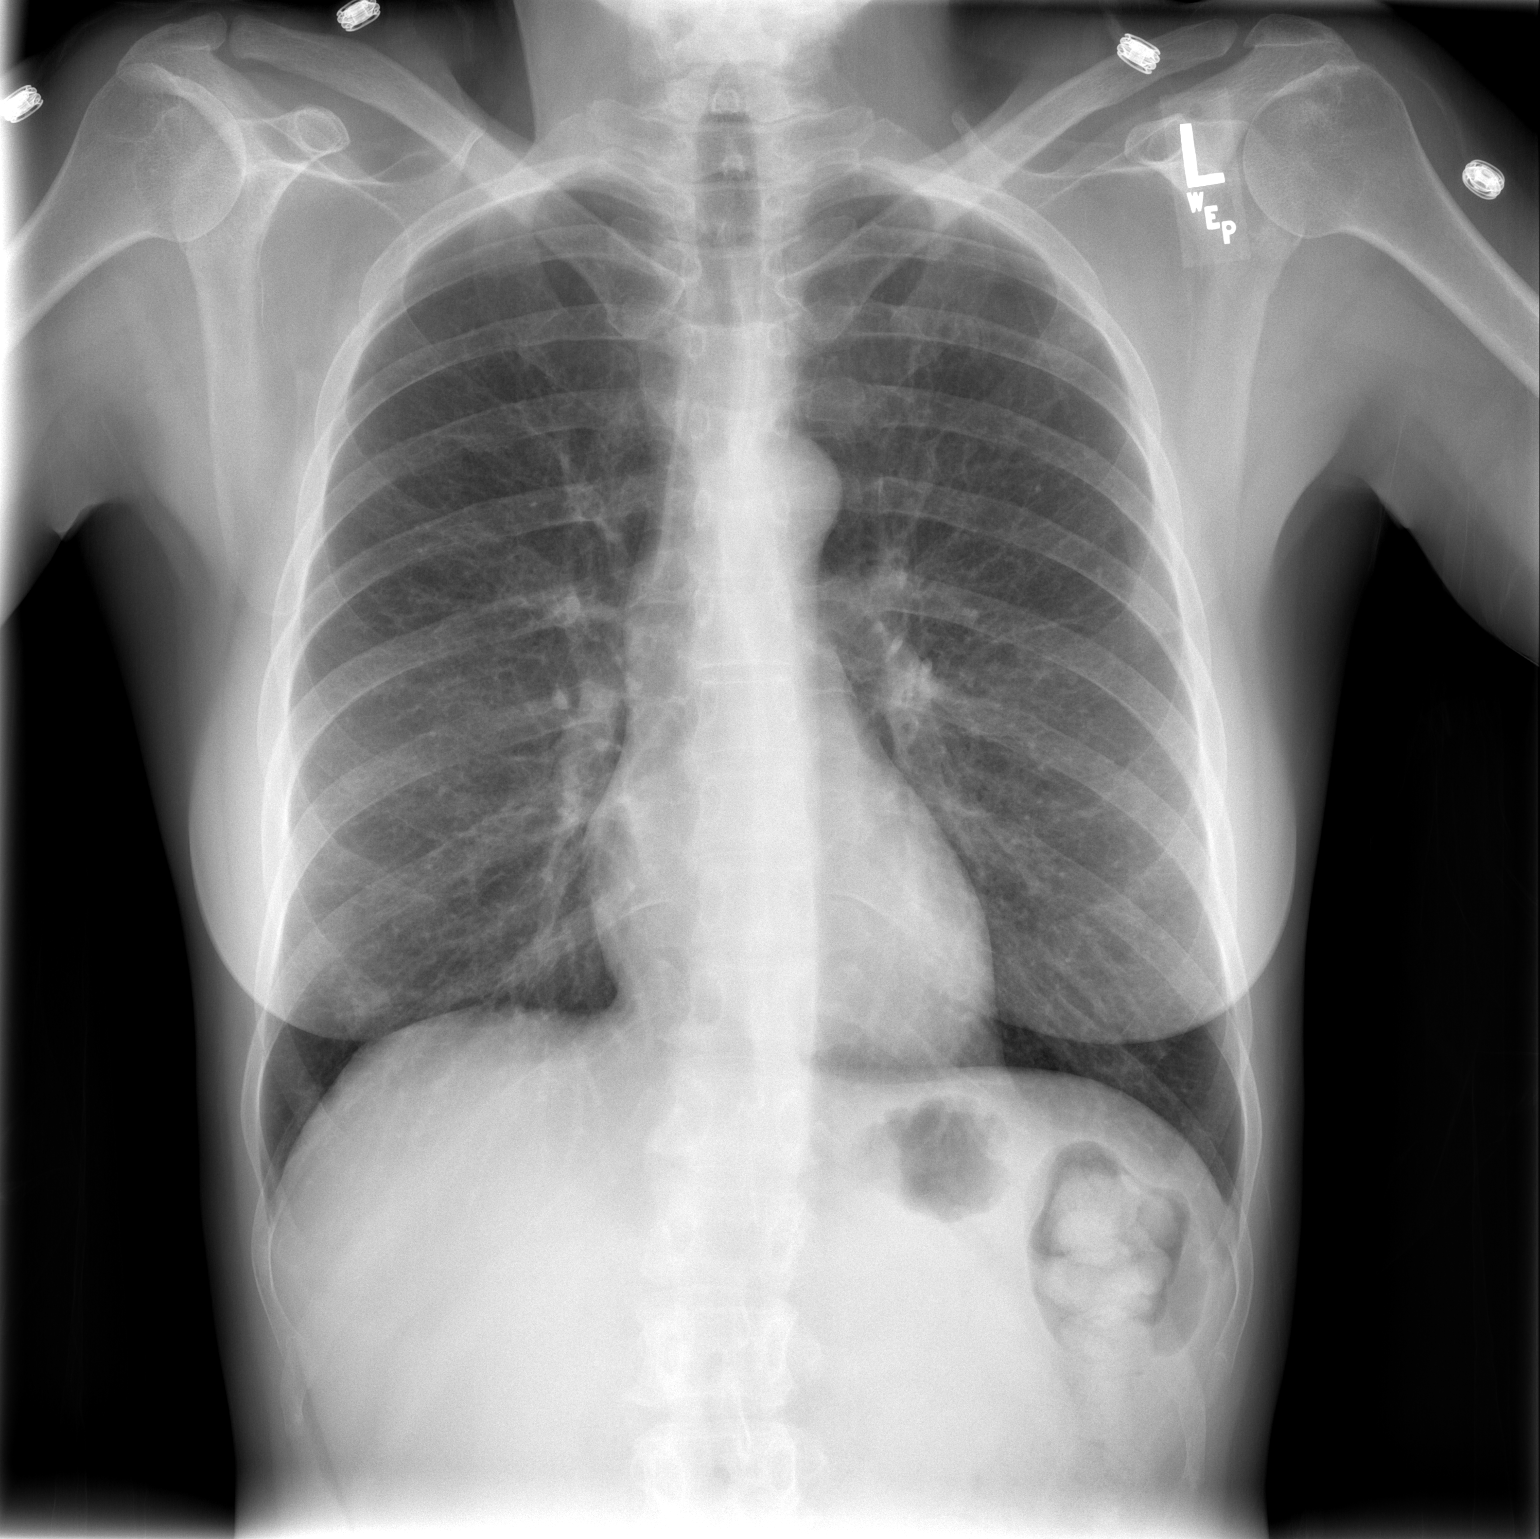

[w chest lat]
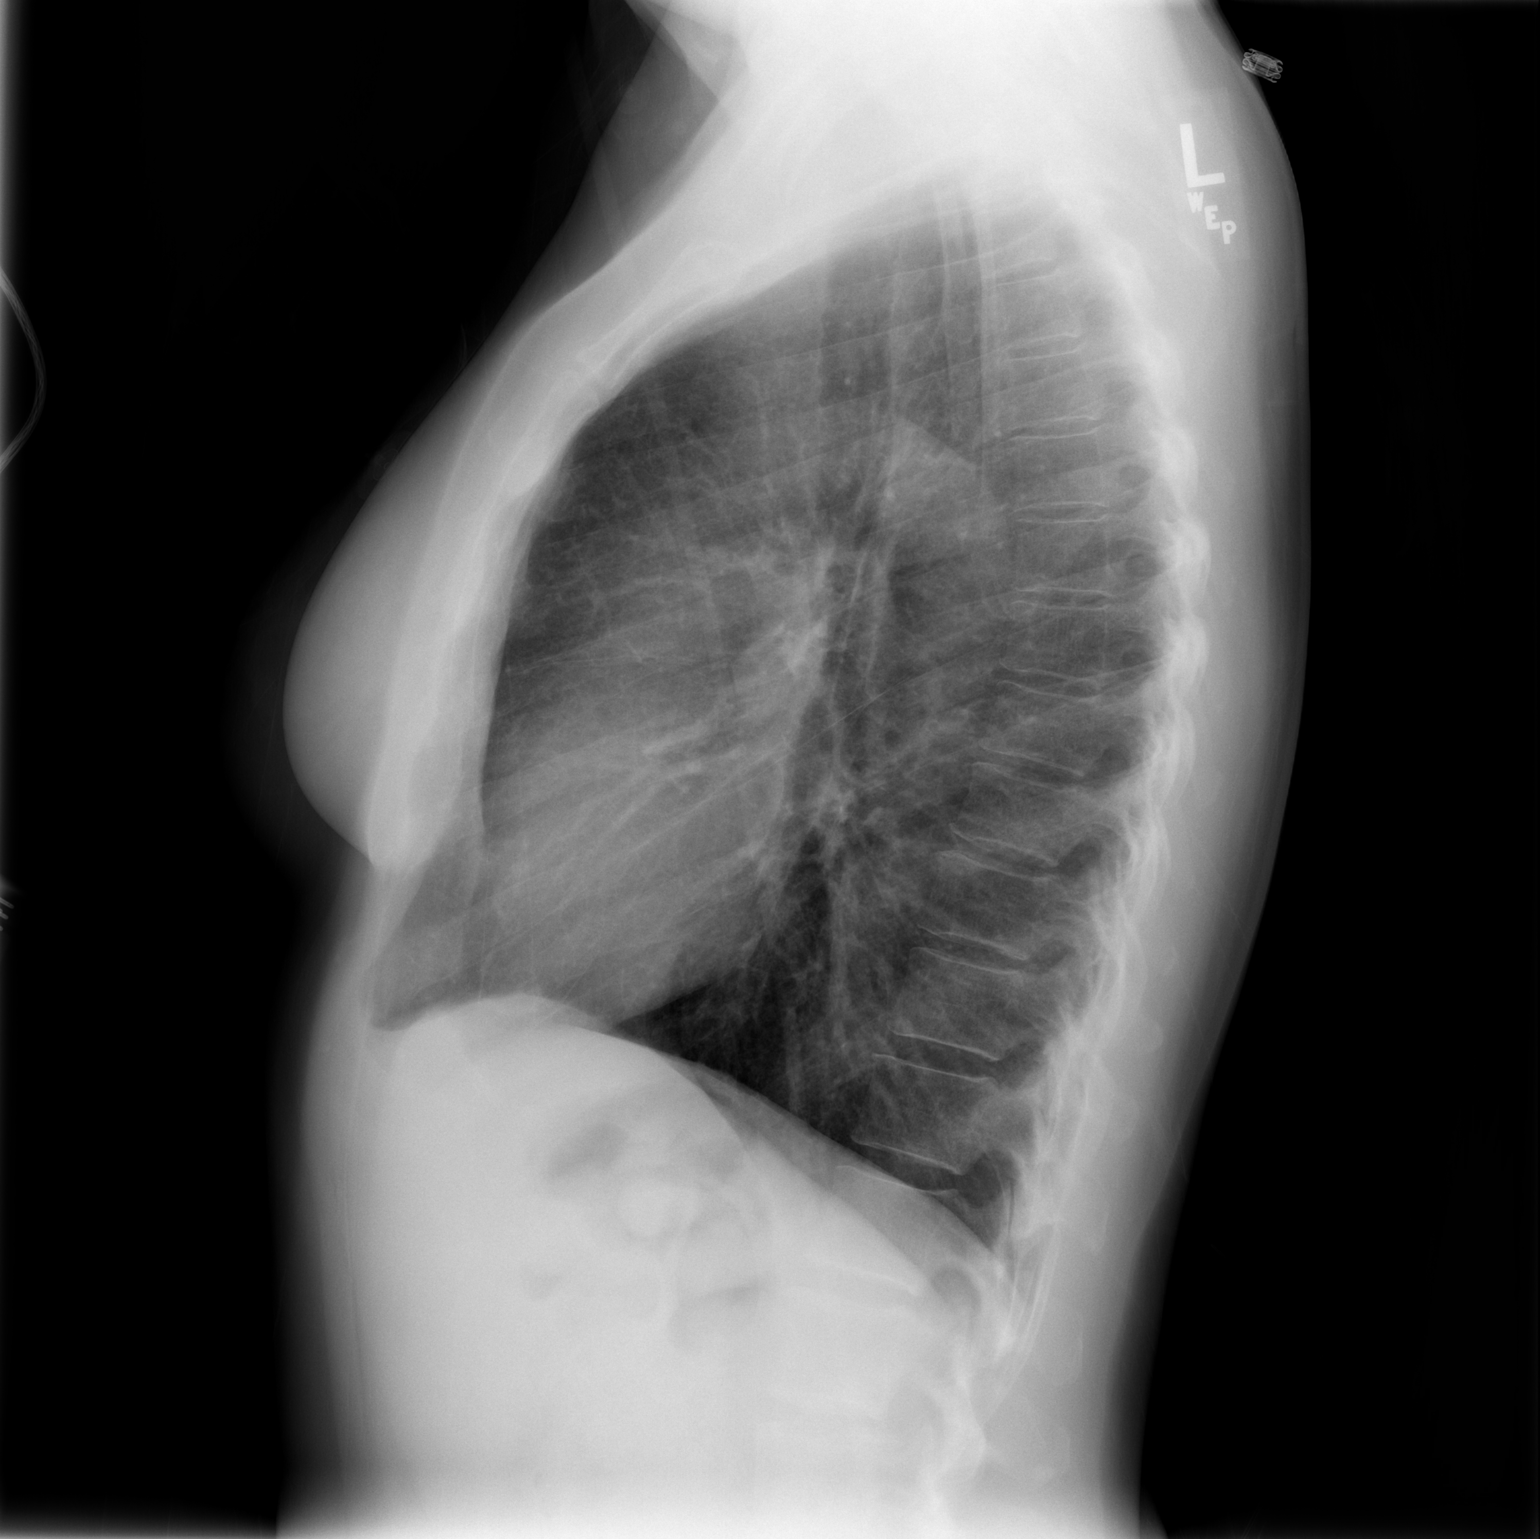

[2 of 2 positions shown; findings below may reference images not displayed]

FINDINGS: Heart size and mediastinal contours are normal.  Both
lungs are clear.  No evidence of pleural effusion.  Mild pulmonary
hyperinflation, suspicious for COPD.
IMPRESSION: Probable COPD.  No active disease.

## 2012-05-10 ENCOUNTER — Ambulatory Visit: Payer: No Typology Code available for payment source

## 2012-05-19 ENCOUNTER — Telehealth: Payer: Self-pay | Admitting: *Deleted

## 2012-05-19 ENCOUNTER — Ambulatory Visit (INDEPENDENT_AMBULATORY_CARE_PROVIDER_SITE_OTHER): Payer: No Typology Code available for payment source | Admitting: Internal Medicine

## 2012-05-19 ENCOUNTER — Encounter: Payer: Self-pay | Admitting: Internal Medicine

## 2012-05-19 VITALS — BP 130/88 | HR 89 | Temp 97.6°F | Ht 63.0 in | Wt 124.9 lb

## 2012-05-19 DIAGNOSIS — J40 Bronchitis, not specified as acute or chronic: Secondary | ICD-10-CM

## 2012-05-19 MED ORDER — AZITHROMYCIN 250 MG PO TABS: ORAL_TABLET | ORAL | Status: DC

## 2012-05-19 MED ORDER — GUAIFENESIN-DM 100-10 MG/5ML PO SYRP: 5.0000 mL | ORAL_SOLUTION | Freq: Three times a day (TID) | ORAL | Status: DC | PRN
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
## 2012-05-19 NOTE — Assessment & Plan Note (Signed)
She reports having productive cough, congestion, chest pain  along with some subjective fevers and dyspnea on exertion x 5 days. On exam her lungs were clear to auscultation bilaterally. I think she most likely has bronchitis. -Z-Pak -Robitussin for cough -Drink plenty of fluids -Warm saline gargles and steam inhalation -Call the clinic if symptoms fail to improve in a week.

## 2012-05-19 NOTE — Telephone Encounter (Signed)
Pt called to let us know she thinks she has pneumonia and will be going to ED for evaluation and xray.  I called pt to offer appointment today in clinic with PCP but no answer.

## 2012-05-19 NOTE — Patient Instructions (Addendum)
General Instructions: Please schedule a follow up appointment with your PCP in1-2 months. Please bring your medication bottles with your next appointment. Please take your medicines as prescribed. Please do steam inhalation and warm saline gargles.     Treatment Goals:  Goals (1 Years of Data) as of 05/19/2012          As of Today 05/05/12 03/19/12 02/11/12 01/14/12     Blood Pressure    . Blood Pressure < 140/90  130/88 173/94 161/88 133/63 137/93      Progress Toward Treatment Goals:  Treatment Goal 05/19/2012  Blood pressure at goal  Stop smoking smoking the same amount    Self Care Goals & Plans:  Self Care Goal 05/19/2012  Manage my medications take my medicines as prescribed; bring my medications to every visit; refill my medications on time  Eat healthy foods eat more vegetables; eat foods that are low in salt  Be physically active find an activity I enjoy; find workout friends  Stop smoking call [facility] (1-800-QUIT-NOW)       Care Management & Community Referrals:  Referral 05/19/2012  Referrals made for care management support none needed

## 2012-05-26 ENCOUNTER — Emergency Department (HOSPITAL_COMMUNITY): Payer: No Typology Code available for payment source

## 2012-05-26 ENCOUNTER — Emergency Department (HOSPITAL_COMMUNITY)
Admission: EM | Admit: 2012-05-26 | Discharge: 2012-05-26 | Disposition: A | Discharge status: Home / Self Care | Payer: No Typology Code available for payment source | Attending: Emergency Medicine | Admitting: Emergency Medicine

## 2012-05-26 ENCOUNTER — Telehealth (HOSPITAL_COMMUNITY): Payer: Self-pay | Admitting: *Deleted

## 2012-05-26 ENCOUNTER — Encounter (HOSPITAL_COMMUNITY): Payer: Self-pay | Admitting: *Deleted

## 2012-05-26 DIAGNOSIS — W19XXXA Unspecified fall, initial encounter: Secondary | ICD-10-CM | POA: Insufficient documentation

## 2012-05-26 DIAGNOSIS — I10 Essential (primary) hypertension: Secondary | ICD-10-CM | POA: Insufficient documentation

## 2012-05-26 DIAGNOSIS — I252 Old myocardial infarction: Secondary | ICD-10-CM | POA: Insufficient documentation

## 2012-05-26 DIAGNOSIS — Z8719 Personal history of other diseases of the digestive system: Secondary | ICD-10-CM | POA: Insufficient documentation

## 2012-05-26 DIAGNOSIS — I2510 Atherosclerotic heart disease of native coronary artery without angina pectoris: Secondary | ICD-10-CM | POA: Insufficient documentation

## 2012-05-26 DIAGNOSIS — F411 Generalized anxiety disorder: Secondary | ICD-10-CM | POA: Insufficient documentation

## 2012-05-26 DIAGNOSIS — Z8781 Personal history of (healed) traumatic fracture: Secondary | ICD-10-CM | POA: Insufficient documentation

## 2012-05-26 DIAGNOSIS — Z8742 Personal history of other diseases of the female genital tract: Secondary | ICD-10-CM | POA: Insufficient documentation

## 2012-05-26 DIAGNOSIS — IMO0002 Reserved for concepts with insufficient information to code with codable children: Secondary | ICD-10-CM | POA: Insufficient documentation

## 2012-05-26 DIAGNOSIS — Y929 Unspecified place or not applicable: Secondary | ICD-10-CM | POA: Insufficient documentation

## 2012-05-26 DIAGNOSIS — Y939 Activity, unspecified: Secondary | ICD-10-CM | POA: Insufficient documentation

## 2012-05-26 DIAGNOSIS — Z87448 Personal history of other diseases of urinary system: Secondary | ICD-10-CM | POA: Insufficient documentation

## 2012-05-26 DIAGNOSIS — Z7982 Long term (current) use of aspirin: Secondary | ICD-10-CM | POA: Insufficient documentation

## 2012-05-26 DIAGNOSIS — M545 Low back pain: Secondary | ICD-10-CM

## 2012-05-26 DIAGNOSIS — E785 Hyperlipidemia, unspecified: Secondary | ICD-10-CM | POA: Insufficient documentation

## 2012-05-26 DIAGNOSIS — F17200 Nicotine dependence, unspecified, uncomplicated: Secondary | ICD-10-CM | POA: Insufficient documentation

## 2012-05-26 DIAGNOSIS — R079 Chest pain, unspecified: Secondary | ICD-10-CM | POA: Insufficient documentation

## 2012-05-26 DIAGNOSIS — M79609 Pain in unspecified limb: Secondary | ICD-10-CM | POA: Insufficient documentation

## 2012-05-26 DIAGNOSIS — F329 Major depressive disorder, single episode, unspecified: Secondary | ICD-10-CM | POA: Insufficient documentation

## 2012-05-26 DIAGNOSIS — F3289 Other specified depressive episodes: Secondary | ICD-10-CM | POA: Insufficient documentation

## 2012-05-26 DIAGNOSIS — Z79899 Other long term (current) drug therapy: Secondary | ICD-10-CM | POA: Insufficient documentation

## 2012-05-26 DIAGNOSIS — Z8673 Personal history of transient ischemic attack (TIA), and cerebral infarction without residual deficits: Secondary | ICD-10-CM | POA: Insufficient documentation

## 2012-05-26 LAB — CBC WITH DIFFERENTIAL/PLATELET
Basophils Absolute: 0 10*3/uL (ref 0.0–0.1)
Basophils Relative: 0 % (ref 0–1)
Eosinophils Relative: 3 % (ref 0–5)
HCT: 37.8 % (ref 36.0–46.0)
Lymphocytes Relative: 49 % — ABNORMAL HIGH (ref 12–46)
MCH: 33.1 pg (ref 26.0–34.0)
MCHC: 34.4 g/dL (ref 30.0–36.0)
MCV: 96.2 fL (ref 78.0–100.0)
Monocytes Absolute: 0.4 10*3/uL (ref 0.1–1.0)
RDW: 13.8 % (ref 11.5–15.5)

## 2012-05-26 LAB — BASIC METABOLIC PANEL
CO2: 25 mEq/L (ref 19–32)
Calcium: 9 mg/dL (ref 8.4–10.5)
Creatinine, Ser: 0.54 mg/dL (ref 0.50–1.10)

## 2012-05-26 LAB — TROPONIN I: Troponin I: <0.3 ng/mL (ref <0.30)

## 2012-05-26 MED ORDER — HYDROMORPHONE HCL PF 1 MG/ML IJ SOLN: 1.0000 mg | Freq: Once | INTRAMUSCULAR | Status: DC

## 2012-05-26 MED ORDER — HYDROMORPHONE HCL PF 2 MG/ML IJ SOLN
2.0000 mg | Freq: Once | INTRAMUSCULAR | Status: AC
  Administered 2012-05-26: 2 mg via INTRAVENOUS
  Filled 2012-05-26: qty 1

## 2012-05-26 MED ORDER — KETOROLAC TROMETHAMINE 30 MG/ML IJ SOLN
30.0000 mg | Freq: Once | INTRAMUSCULAR | Status: AC
  Administered 2012-05-26: 30 mg via INTRAVENOUS
  Filled 2012-05-26: qty 1

## 2012-05-26 MED ORDER — HYDROCODONE-ACETAMINOPHEN 5-325 MG PO TABS
1.0000 | ORAL_TABLET | Freq: Once | ORAL | Status: AC
  Administered 2012-05-26: 1 via ORAL
  Filled 2012-05-26: qty 1

## 2012-05-26 MED ORDER — KETOROLAC TROMETHAMINE 30 MG/ML IJ SOLN: 30.0000 mg | Freq: Once | INTRAMUSCULAR | Status: DC

## 2012-05-26 MED ORDER — HYDROMORPHONE HCL PF 1 MG/ML IJ SOLN
1.0000 mg | Freq: Once | INTRAMUSCULAR | Status: AC
  Administered 2012-05-26: 1 mg via INTRAVENOUS
  Filled 2012-05-26: qty 1

## 2012-05-26 MED ORDER — HYDROCODONE-ACETAMINOPHEN 5-325 MG PO TABS: 2.0000 | ORAL_TABLET | ORAL | Status: DC | PRN

## 2012-05-26 MED ORDER — DIAZEPAM 5 MG/ML IJ SOLN
5.0000 mg | Freq: Once | INTRAMUSCULAR | Status: AC
  Administered 2012-05-26: 5 mg via INTRAVENOUS
  Filled 2012-05-26: qty 2

## 2012-05-26 NOTE — ED Notes (Signed)
Pt transported to xray 

## 2012-05-26 NOTE — ED Provider Notes (Signed)
I have supervised the resident on the management of this patient and agree with the note above. I personally interviewed and examined the patient and my addendum is below.   Brenda [name] is a 52 y.o. female hx of chronic back pain s/p fall. Worsening back pain after slipping. Also had some chest pain that resolved after nitro. Minimal lumbar tenderness and lower cervical tenderness. Will get xrays. Will get EKG and trop x 2.   7:52 PM Xrays showed no fracture. C collar removed. Trop neg x 2, EKG unremarkable. Felt better after valium, toradol, and dilaudid. Will d/c home on a short course of percocet.   Results for orders placed during the hospital encounter of 05/26/12  TROPONIN I      Result Value Range   Troponin I <0.30  <0.30 ng/mL  CBC WITH DIFFERENTIAL      Result Value Range   WBC 7.1  4.0 - 10.5 K/uL   RBC 3.93  3.87 - 5.11 MIL/uL   Hemoglobin 13.0  12.0 - 15.0 g/dL   HCT 16.1  09.6 - 04.5 %   MCV 96.2  78.0 - 100.0 fL   MCH 33.1  26.0 - 34.0 pg   MCHC 34.4  30.0 - 36.0 g/dL   RDW 40.9  81.1 - 91.4 %   Platelets 231  150 - 400 K/uL   Neutrophils Relative 43  43 - 77 %   Neutro Abs 3.0  1.7 - 7.7 K/uL   Lymphocytes Relative 49 (*) 12 - 46 %   Lymphs Abs 3.5  0.7 - 4.0 K/uL   Monocytes Relative 5  3 - 12 %   Monocytes Absolute 0.4  0.1 - 1.0 K/uL   Eosinophils Relative 3  0 - 5 %   Eosinophils Absolute 0.2  0.0 - 0.7 K/uL   Basophils Relative 0  0 - 1 %   Basophils Absolute 0.0  0.0 - 0.1 K/uL  BASIC METABOLIC PANEL      Result Value Range   Sodium 140  135 - 145 mEq/L   Potassium 4.0  3.5 - 5.1 mEq/L   Chloride 105  96 - 112 mEq/L   CO2 25  19 - 32 mEq/L   Glucose, Bld 90  70 - 99 mg/dL   BUN 11  6 - 23 mg/dL   Creatinine, Ser 7.82  0.50 - 1.10 mg/dL   Calcium 9.0  8.4 - 95.6 mg/dL   GFR calc non Af Amer >90  >90 mL/min   GFR calc Af Amer >90  >90 mL/min  TROPONIN I      Result Value Range   Troponin I <0.30  <0.30 ng/mL   Dg Chest 1 View  05/26/2012   *RADIOLOGY REPORT*  Clinical Data: Post fall, now with back pain  CHEST - 1 VIEW  Comparison: 02/14/2011; 07/10/2010; 05/17/2011  Findings: Unchanged cardiac silhouette and mediastinal contours. The lungs appear hyperinflated with flattening of bilateral hemidiaphragms and mild diffuse thickening of the pulmonary station.  No new focal airspace opacity.  No definite pleural effusion or pneumothorax.  Unchanged bones.  IMPRESSION: Mild lung hyperexpansion and bronchitic change without acute cardiopulmonary disease.   Original Report Authenticated By: Tacey Ruiz, MD    Dg Cervical Spine 2-3 Views  05/26/2012  *RADIOLOGY REPORT*  Clinical Data: Post fall, now with lower cervical spine pain  CERVICAL SPINE - 2-3 VIEW  Comparison: Cervical spine CT - 03/19/2012  Findings:  C1 to the superior endplate of  T1 is visualized on the lateral radiograph.  There is mild straightening of the expected cervical lordosis.  No definite anterolisthesis or retrolisthesis.  Evaluation of the atlantodental articulation is suboptimally evaluated due to overlying osseous structures.  Cervical vertebral body heights appear preserved.  Prevertebral soft tissues are normal.  There is grossly unchanged mild to moderate multilevel cervical spine in DDD, worse at C6 - C7 and to a lesser extent, C5 - C6 with disc space height loss, end plate irregularity and sclerosis.  Regional soft tissues are normal.  Limited visualization of the lung apices is normal.  IMPRESSION: 1.  No definite acute findings. 2.  Grossly unchanged mild to moderate multilevel cervical spine DDD, worse at C6 - C7.   Original Report Authenticated By: Tacey Ruiz, MD    Dg Lumbar Spine 2-3 Views  05/26/2012  *RADIOLOGY REPORT*  Clinical Data: Post fall, now with low back pain  LUMBAR SPINE - 2-3 VIEW  Comparison: The lumbar spine radiographs - 03/19/2012; lumbar spine MRI - 10/22/2011; CT abdomen pelvis - 05/11/2011  Findings:  There are five non-rib bearing lumbar  type vertebral bodies with lumbarization of the S1 vertebral body.  For the purposes of this dictation, lumbar vertebral labels will be labeled L1-L5 (note, this labeling is different than the labeling provided on lumbar spine MRI performed 10/12/2011).  There is mild scoliotic curvature of the thoracolumbar spine, convex to the right, possibly positional.  No anterolisthesis or retrolisthesis.  Lumbar vertebral body heights are preserved.  Intervertebral disc spaces are preserved.  Limited visualization of the bilateral SI joints and hips is normal.  An intrauterine device overlies the pelvis.  A phlebolith overlies the right hemi pelvis.  The regional soft tissues and bowel gas patterns are normal.  IMPRESSION: 1.  No acute findings.  2. Transitional anatomy as detailed above.   Original Report Authenticated By: Tacey Ruiz, MD    Dg Sacrum/coccyx  05/26/2012  *RADIOLOGY REPORT*  Clinical Data: Post fall  SACRUM AND COCCYX - 2+ VIEW  Comparison: Lumbar spine radiographs - earlier same day  Findings:  No definite displaced sacral or coccygeal fracture.  The pubic symphysis and bilateral SI joint spaces appear normal.  An intrauterine device and phlebolith overlie the pelvis.  Regional bowel gas pattern and soft tissues are normal.  IMPRESSION: No definite displaced sacral or coccygeal fracture.   Original Report Authenticated By: Tacey Ruiz, MD       Richardean Canal, MD 05/26/12 [phone]

## 2012-05-26 NOTE — ED Notes (Addendum)
Patient states she fell at some point this morning and now is having lower back pain radiating into tailbone and states she may have some slight neck pain.   Patient with history of back pain and chronic pain.  Per EMS patient took tramadol tabs x 2 pta and also some PO medication her brother gave her for pain but unsure of name.  Patient states her back was also hurting so bad she decided to take nitro x 3 tabs prior to EMS arriving on scene with no relief.

## 2012-05-26 NOTE — ED Notes (Signed)
Up walking around the room.  Stated she needs her pain med before she goes home.
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
## 2012-06-12 ENCOUNTER — Encounter (HOSPITAL_COMMUNITY): Payer: Self-pay | Admitting: Emergency Medicine

## 2012-06-12 ENCOUNTER — Emergency Department (HOSPITAL_COMMUNITY)
Admission: EM | Admit: 2012-06-12 | Discharge: 2012-06-13 | Disposition: A | Discharge status: Home / Self Care | Payer: No Typology Code available for payment source | Attending: Emergency Medicine | Admitting: Emergency Medicine

## 2012-06-12 DIAGNOSIS — E785 Hyperlipidemia, unspecified: Secondary | ICD-10-CM | POA: Insufficient documentation

## 2012-06-12 DIAGNOSIS — Z87448 Personal history of other diseases of urinary system: Secondary | ICD-10-CM | POA: Insufficient documentation

## 2012-06-12 DIAGNOSIS — M545 Low back pain, unspecified: Secondary | ICD-10-CM | POA: Insufficient documentation

## 2012-06-12 DIAGNOSIS — Z8614 Personal history of Methicillin resistant Staphylococcus aureus infection: Secondary | ICD-10-CM | POA: Insufficient documentation

## 2012-06-12 DIAGNOSIS — I252 Old myocardial infarction: Secondary | ICD-10-CM | POA: Insufficient documentation

## 2012-06-12 DIAGNOSIS — Z87440 Personal history of urinary (tract) infections: Secondary | ICD-10-CM | POA: Insufficient documentation

## 2012-06-12 DIAGNOSIS — Z3202 Encounter for pregnancy test, result negative: Secondary | ICD-10-CM | POA: Insufficient documentation

## 2012-06-12 DIAGNOSIS — Z7982 Long term (current) use of aspirin: Secondary | ICD-10-CM | POA: Insufficient documentation

## 2012-06-12 DIAGNOSIS — I10 Essential (primary) hypertension: Secondary | ICD-10-CM | POA: Insufficient documentation

## 2012-06-12 DIAGNOSIS — I209 Angina pectoris, unspecified: Secondary | ICD-10-CM

## 2012-06-12 DIAGNOSIS — Z8673 Personal history of transient ischemic attack (TIA), and cerebral infarction without residual deficits: Secondary | ICD-10-CM | POA: Insufficient documentation

## 2012-06-12 DIAGNOSIS — R45851 Suicidal ideations: Secondary | ICD-10-CM | POA: Insufficient documentation

## 2012-06-12 DIAGNOSIS — Z8781 Personal history of (healed) traumatic fracture: Secondary | ICD-10-CM | POA: Insufficient documentation

## 2012-06-12 DIAGNOSIS — Z8742 Personal history of other diseases of the female genital tract: Secondary | ICD-10-CM | POA: Insufficient documentation

## 2012-06-12 DIAGNOSIS — G7109 Other specified muscular dystrophies: Secondary | ICD-10-CM | POA: Insufficient documentation

## 2012-06-12 DIAGNOSIS — F1010 Alcohol abuse, uncomplicated: Secondary | ICD-10-CM | POA: Insufficient documentation

## 2012-06-12 DIAGNOSIS — F411 Generalized anxiety disorder: Secondary | ICD-10-CM | POA: Insufficient documentation

## 2012-06-12 DIAGNOSIS — IMO0002 Reserved for concepts with insufficient information to code with codable children: Secondary | ICD-10-CM | POA: Insufficient documentation

## 2012-06-12 DIAGNOSIS — G8929 Other chronic pain: Secondary | ICD-10-CM | POA: Insufficient documentation

## 2012-06-12 DIAGNOSIS — Z8719 Personal history of other diseases of the digestive system: Secondary | ICD-10-CM | POA: Insufficient documentation

## 2012-06-12 DIAGNOSIS — F329 Major depressive disorder, single episode, unspecified: Secondary | ICD-10-CM | POA: Insufficient documentation

## 2012-06-12 DIAGNOSIS — Z8739 Personal history of other diseases of the musculoskeletal system and connective tissue: Secondary | ICD-10-CM | POA: Insufficient documentation

## 2012-06-12 DIAGNOSIS — R079 Chest pain, unspecified: Secondary | ICD-10-CM

## 2012-06-12 DIAGNOSIS — Z79899 Other long term (current) drug therapy: Secondary | ICD-10-CM | POA: Insufficient documentation

## 2012-06-12 DIAGNOSIS — F17200 Nicotine dependence, unspecified, uncomplicated: Secondary | ICD-10-CM | POA: Insufficient documentation

## 2012-06-12 DIAGNOSIS — F3289 Other specified depressive episodes: Secondary | ICD-10-CM | POA: Insufficient documentation

## 2012-06-12 LAB — BASIC METABOLIC PANEL
Calcium: 9.2 mg/dL (ref 8.4–10.5)
Chloride: 109 mEq/L (ref 96–112)
Creatinine, Ser: 0.55 mg/dL (ref 0.50–1.10)
GFR calc Af Amer: >90 mL/min (ref >90)
GFR calc non Af Amer: >90 mL/min (ref >90)

## 2012-06-12 LAB — CBC WITH DIFFERENTIAL/PLATELET
Basophils Absolute: 0 10*3/uL (ref 0.0–0.1)
Basophils Relative: 1 % (ref 0–1)
Eosinophils Absolute: 0.2 10*3/uL (ref 0.0–0.7)
Eosinophils Relative: 2 % (ref 0–5)
HCT: 41.7 % (ref 36.0–46.0)
MCHC: 35.3 g/dL (ref 30.0–36.0)
Monocytes Absolute: 0.4 10*3/uL (ref 0.1–1.0)
Neutro Abs: 4.1 10*3/uL (ref 1.7–7.7)
RDW: 14 % (ref 11.5–15.5)

## 2012-06-12 LAB — ETHANOL: Alcohol, Ethyl (B): 253 mg/dL — ABNORMAL HIGH (ref 0–11)

## 2012-06-12 LAB — CBC
HCT: 44.5 % (ref 36.0–46.0)
Hemoglobin: 15.2 g/dL — ABNORMAL HIGH (ref 12.0–15.0)
RBC: 4.66 MIL/uL (ref 3.87–5.11)
WBC: 8.3 10*3/uL (ref 4.0–10.5)

## 2012-06-12 IMAGING — CT CT ABD-PELV W/O CM
2 of 5 series · 17 of 46 positions shown, 19 images · non-contrast
Comparison: 04/13/2009

CLINICAL DATA: Right flank pain.

CT ABDOMEN AND PELVIS WITHOUT CONTRAST
TECHNIQUE: Multidetector CT imaging of the abdomen and pelvis was
performed following the standard protocol without intravenous
contrast.

[Series 2: stone study · axial · 0.61mm/px · z∈[-442,-62]mm · 14 of 88 slices shown, 16 images]
[im 6/88  soft-tissue]
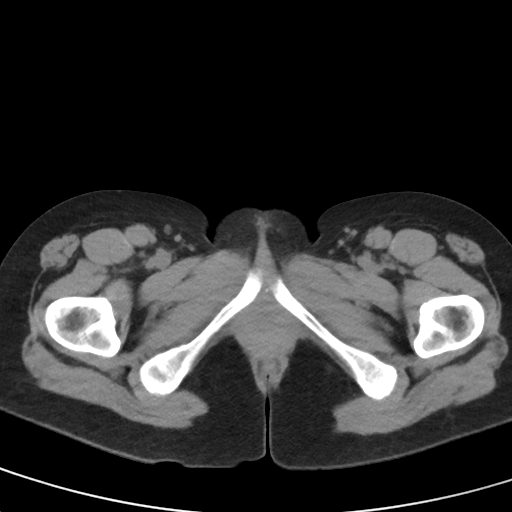
[im 6/88  bone]
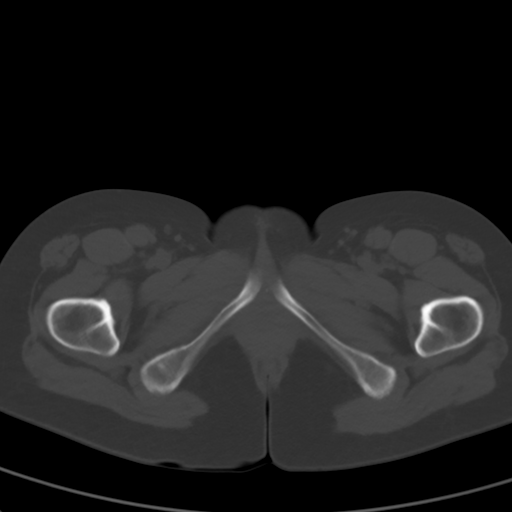
[im 11/88  soft-tissue]
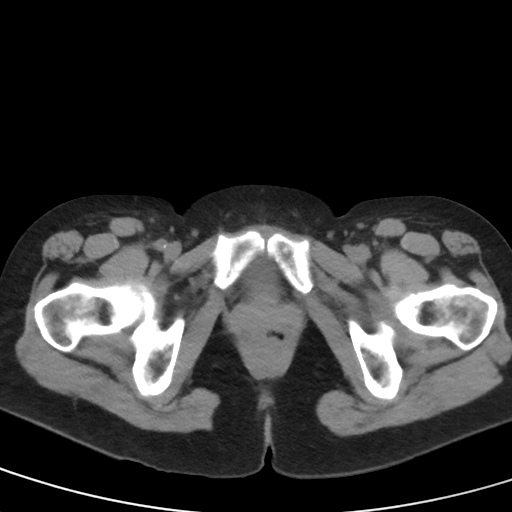
[im 17/88  soft-tissue]
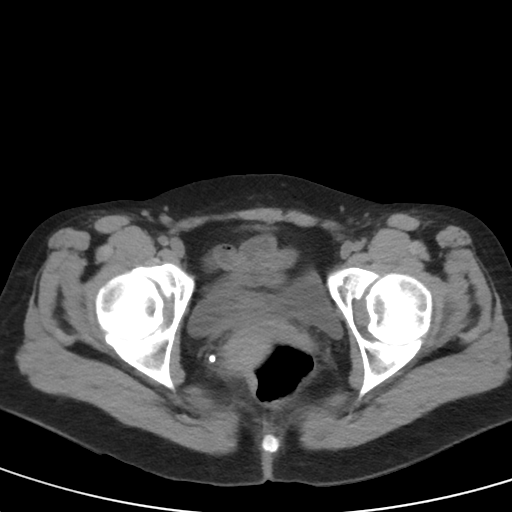
[im 22/88  soft-tissue]
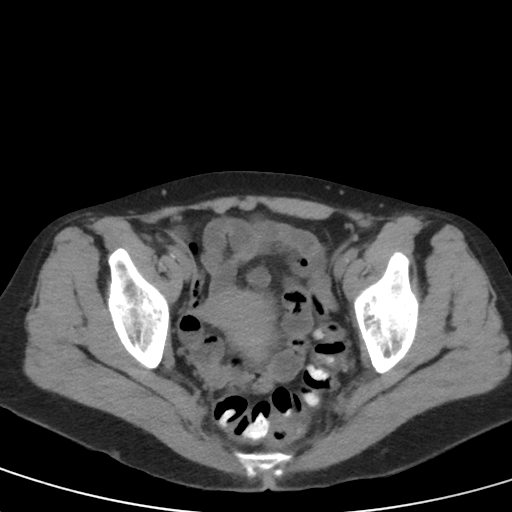
[im 28/88  soft-tissue]
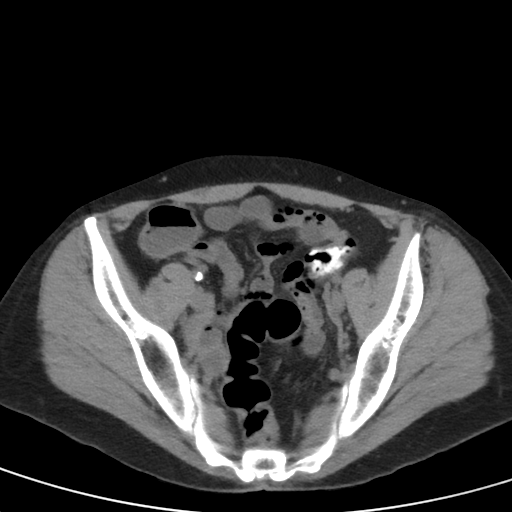
[im 33/88  soft-tissue]
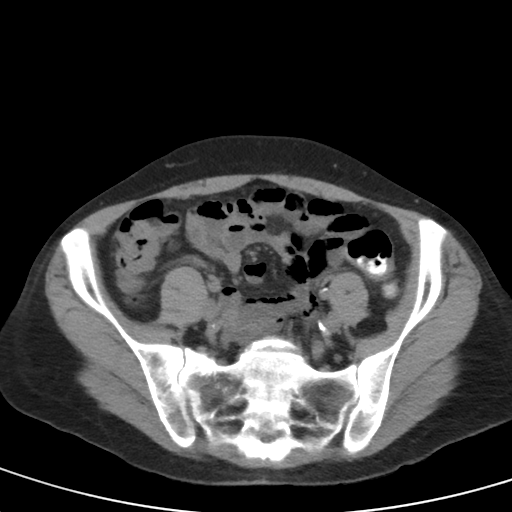
[im 39/88  soft-tissue]
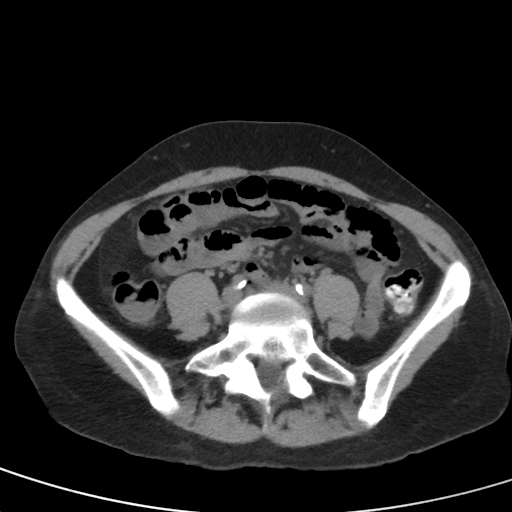
[im 49/88  soft-tissue]
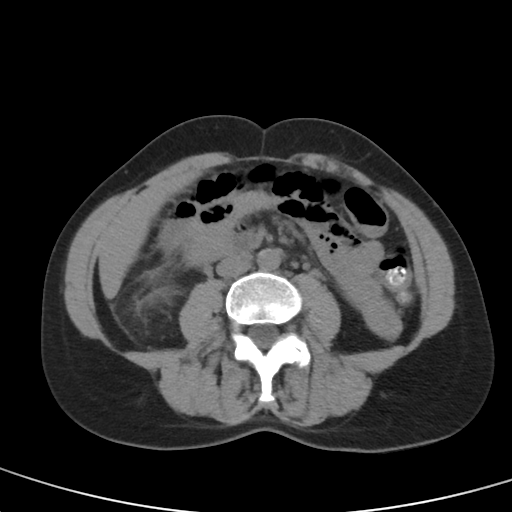
[im 55/88  soft-tissue]
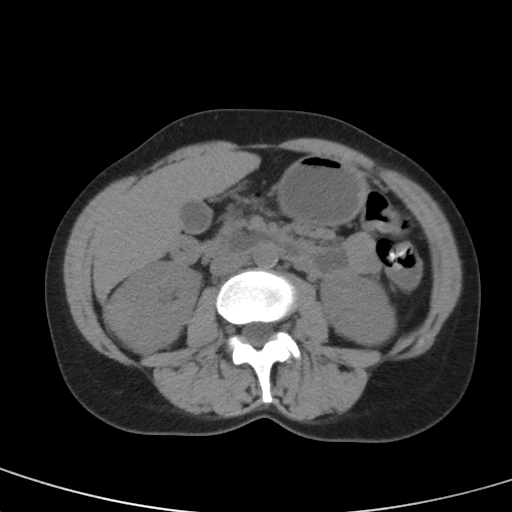
[im 55/88  bone]
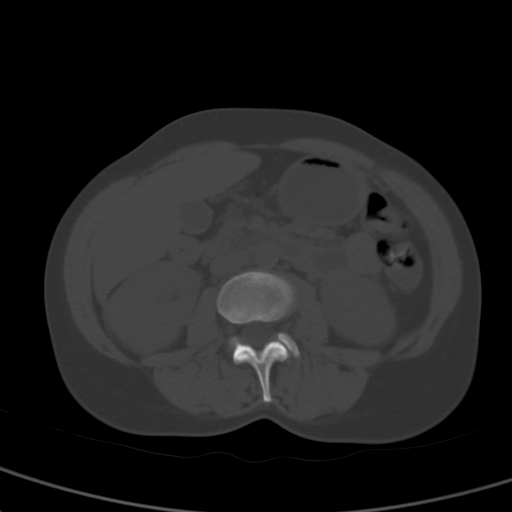
[im 60/88  soft-tissue]
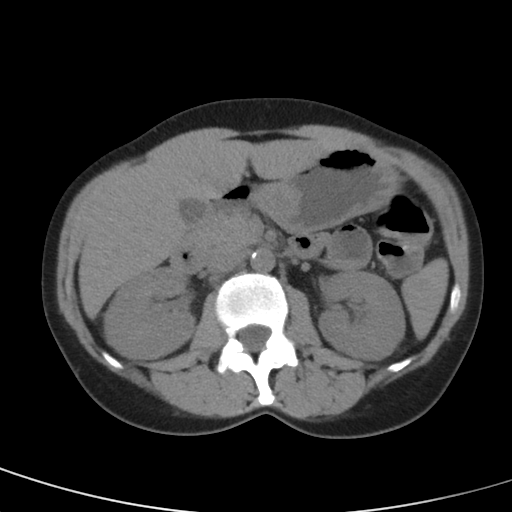
[im 66/88  soft-tissue]
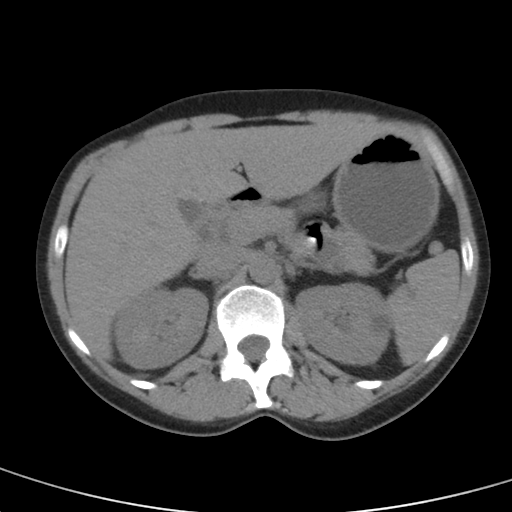
[im 71/88  soft-tissue]
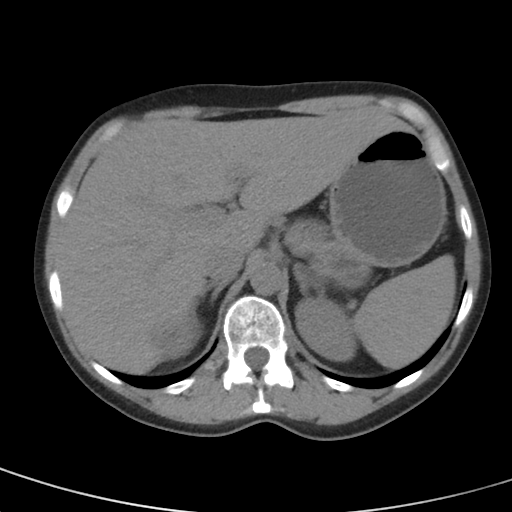
[im 77/88  soft-tissue]
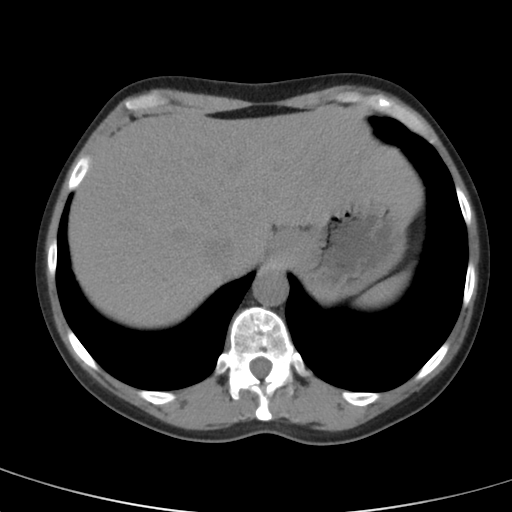
[im 82/88  soft-tissue]
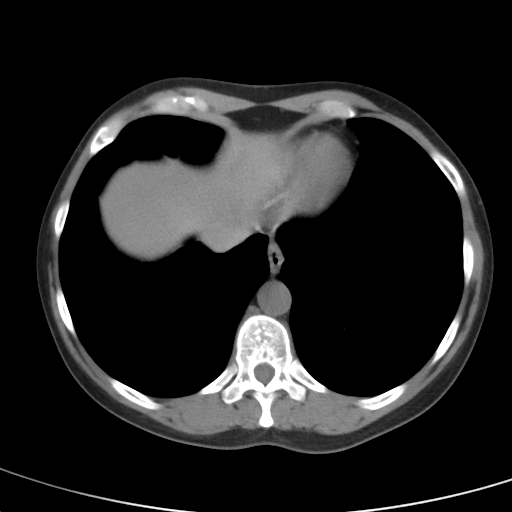

[mpr, coronals, coronal · coronal · 0.85mm/px · 3 of 68 slices shown]
[im 23/68  soft-tissue]
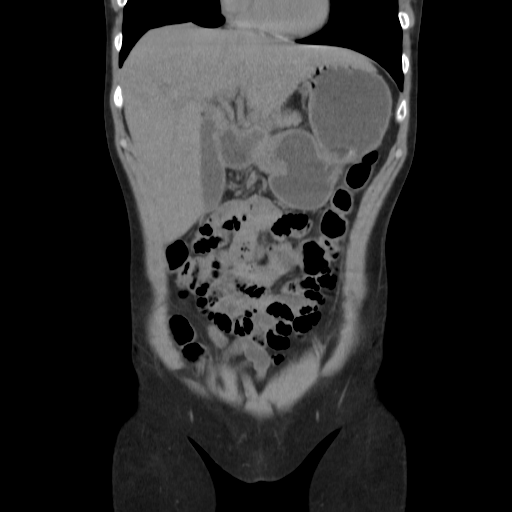
[im 30/68  soft-tissue]
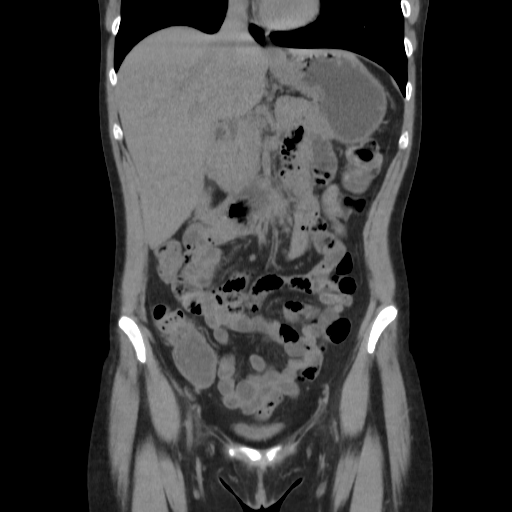
[im 38/68  soft-tissue]
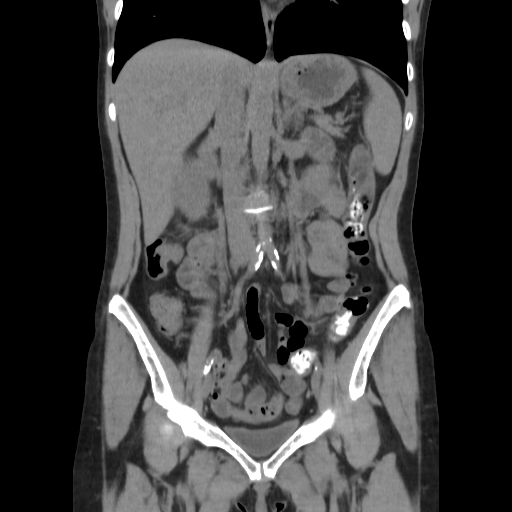

[17 of 46 positions shown; findings below may reference images not displayed]

FINDINGS: The lung bases are clear.  The unenhanced appearance of
the liver, gallbladder, spleen, adrenal glands and pancreas are
within normal limits.  No renal stones are seen.  There is an
incompletely characterized 13 mm lesion seen in the left interpolar
region.  No stones are seen in the kidneys.  The ureters are normal
in course and caliber without stones.  No stones are seen in the
urinary bladder.  There is abnormal stranding seen adjacent to the
right kidney with trace fluid along the perinephric fascia.  There
is no small bowel obstruction.  Normal appendix is seen in the
right lower quadrant.  There is trace fluid seen adjacent to the
ascending colon and colon at the hepatic flexure.  An IUD is seen
in the uterus.  A functional follicles seen in the right ovary.
Atherosclerotic calcifications are seen in the aorta major branch
vessels.  No aggressive lytic or blastic bone lesions are seen.
Note is made of partial sacralization of the right L5 vertebral
body.
IMPRESSION: 1.  Mild enlargement and isthmus are strain involving the right
kidney concerning for underlying infection/inflammatory/vascular
compromise.  No stones are seen on today's exam.  Further
evaluation with either renal ultrasound or CT scan with contrast
may be of use.
2.  Incompletely evaluated left renal lesion.  The lesion is
unchanged compared with CT of April 2009.

## 2012-06-12 IMAGING — CR DG ABDOMEN ACUTE W/ 1V CHEST
3 series · 3 of 3 positions shown · non-contrast
Comparison: Chest x-ray 12/12/2009 and CT abdomen pelvis 04/13/2009

CLINICAL DATA: Right abdominal pain with nausea, vomiting and
fever.

ACUTE ABDOMEN SERIES (ABDOMEN 2 VIEW & CHEST 1 VIEW)

[w chest pa]
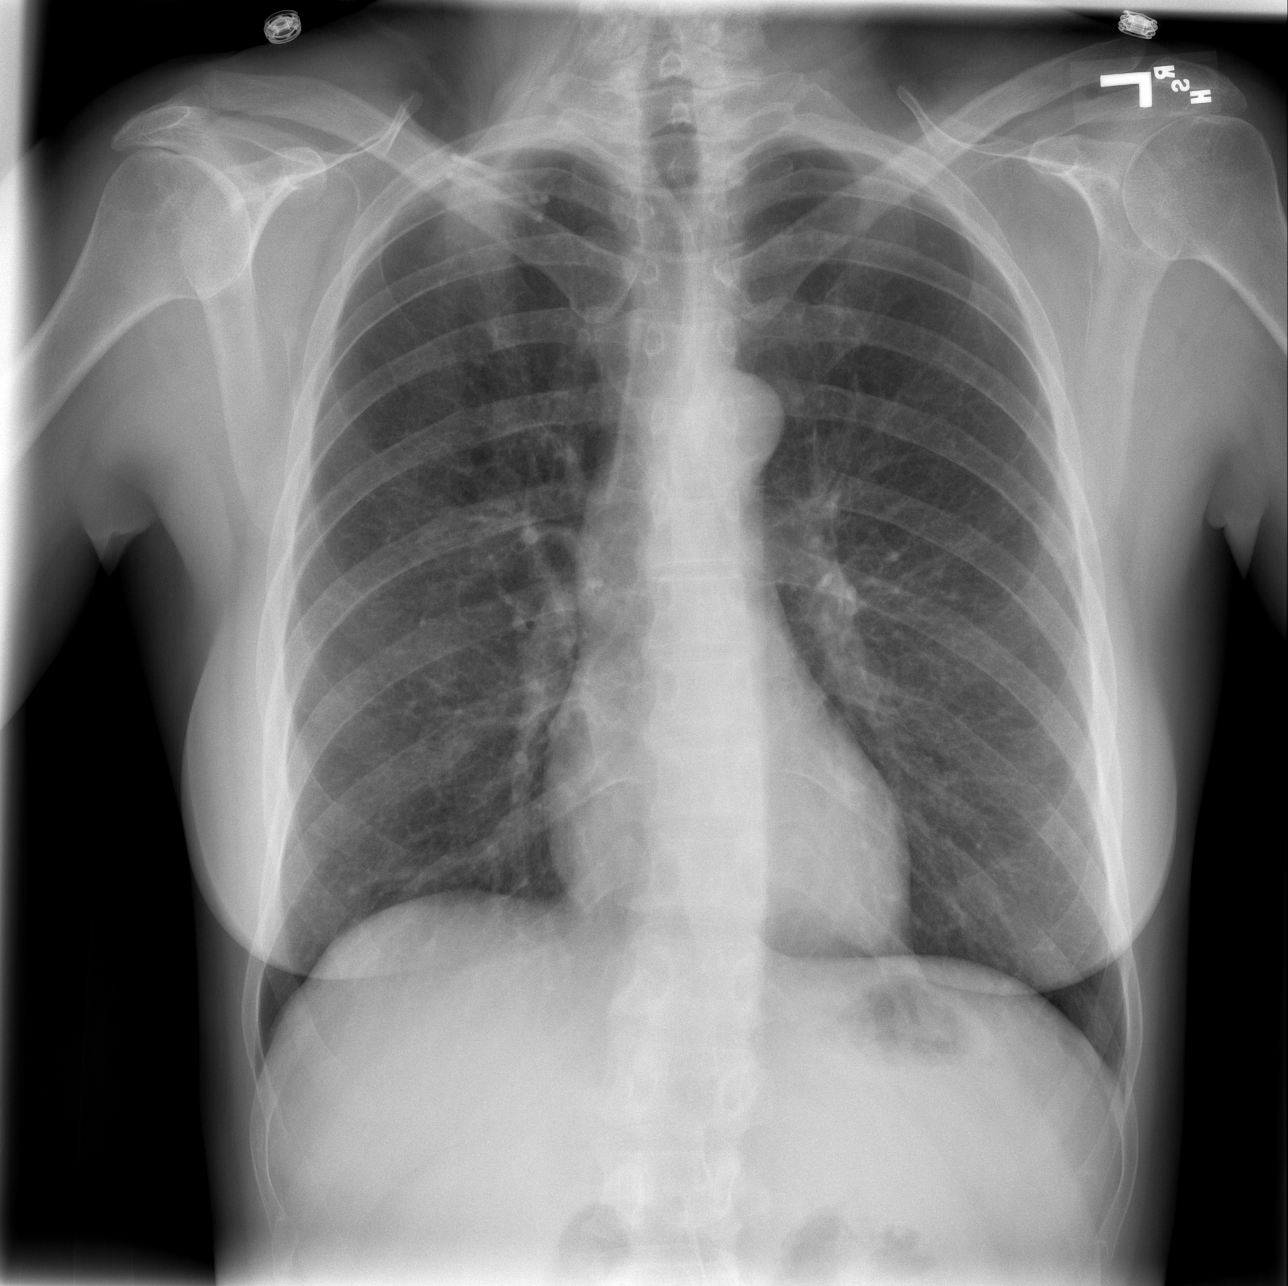

[w abdomen upright]
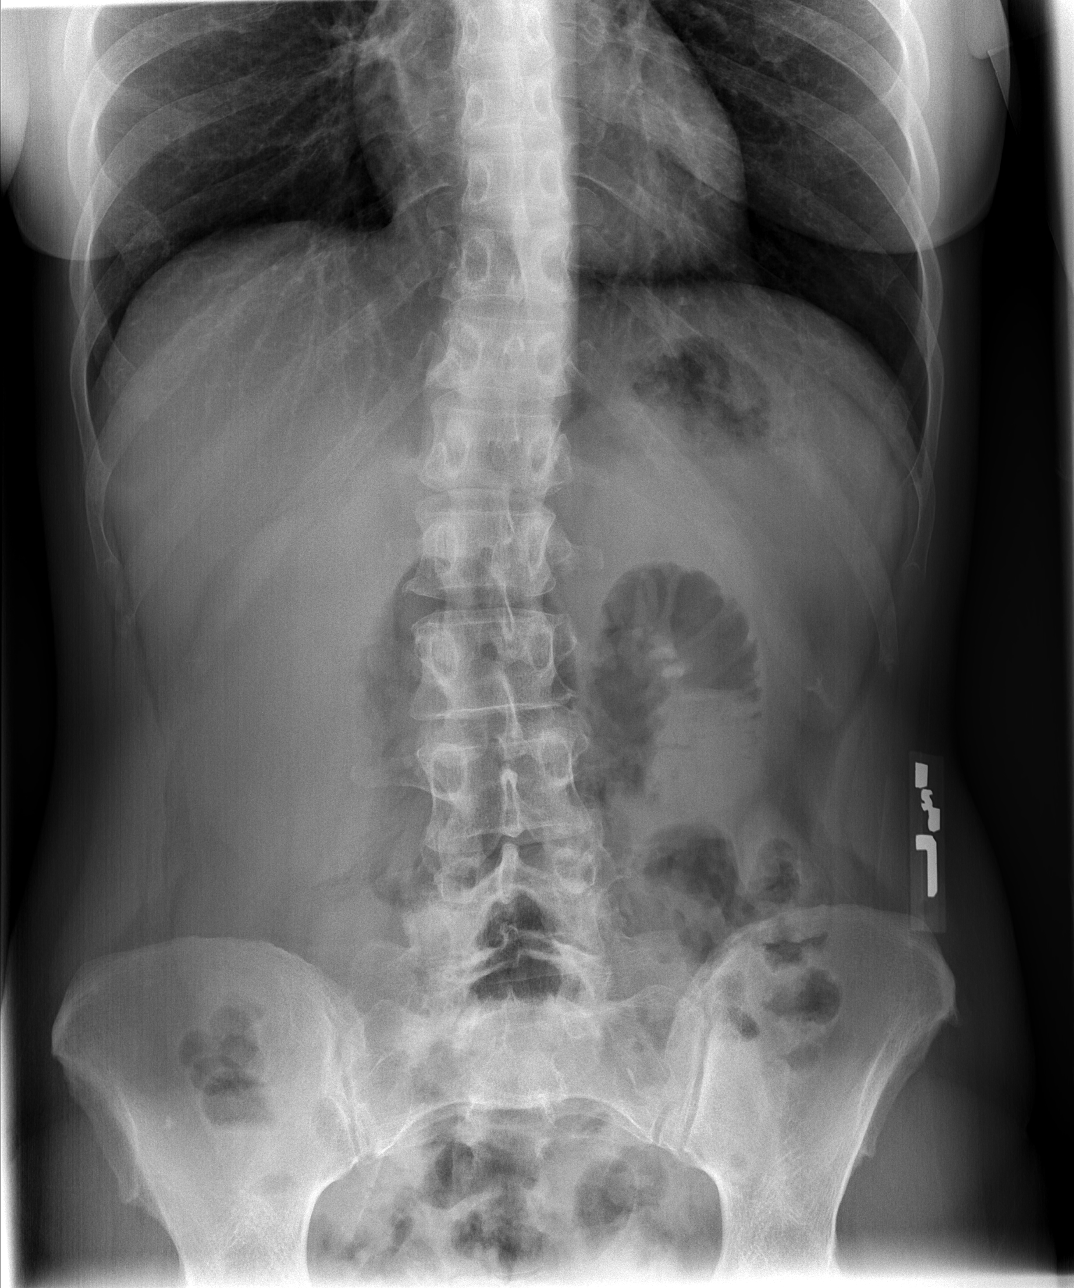

[t abdomen supine]
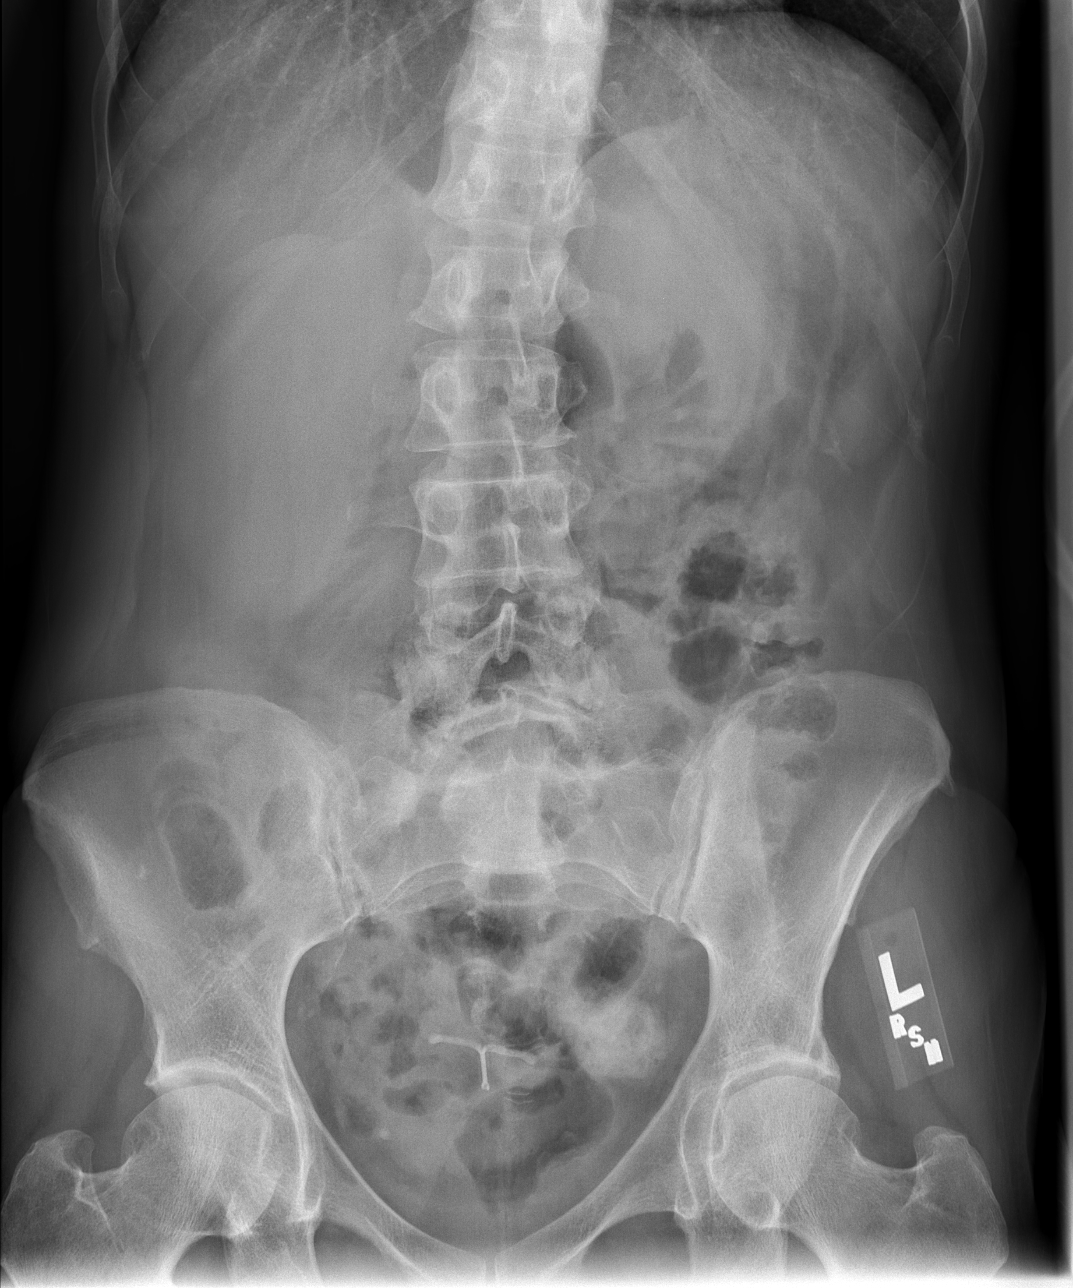

[3 of 3 positions shown; findings below may reference images not displayed]

FINDINGS: Frontal view of the chest shows midline trachea and
normal heart size.  Lungs are clear.  No pleural fluid. Two views
of the abdomen show mildly dilated loops of small bowel in the
central abdomen.  Gas is seen in the colon.  Intrauterine
contraceptive device is noted.
IMPRESSION: Bowel gas pattern is nonspecific.  No evidence of overt
obstruction.

## 2012-06-12 MED ORDER — MIDAZOLAM HCL 2 MG/2ML IJ SOLN
INTRAMUSCULAR | Status: AC
  Administered 2012-06-12: 5 mg via INTRAVENOUS
  Filled 2012-06-12: qty 6

## 2012-06-12 MED ORDER — HALOPERIDOL LACTATE 5 MG/ML IJ SOLN: 5.0000 mg | Freq: Once | INTRAMUSCULAR | Status: AC

## 2012-06-12 MED ORDER — HALOPERIDOL LACTATE 5 MG/ML IJ SOLN
INTRAMUSCULAR | Status: AC
  Administered 2012-06-12: 5 mg via INTRAVENOUS
  Filled 2012-06-12: qty 1

## 2012-06-12 NOTE — ED Notes (Signed)
Pt in room yelling and cursing. Coming out of room without clothes. Patient taken back to room and given versed per Dr Radford Pax

## 2012-06-12 NOTE — ED Notes (Signed)
Pt given warm blanket. She is sleeping.
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
## 2012-06-13 ENCOUNTER — Encounter (HOSPITAL_COMMUNITY): Payer: Self-pay | Admitting: Nurse Practitioner

## 2012-06-13 DIAGNOSIS — R079 Chest pain, unspecified: Secondary | ICD-10-CM

## 2012-06-13 LAB — COMPREHENSIVE METABOLIC PANEL
Albumin: 4 g/dL (ref 3.5–5.2)
Alkaline Phosphatase: 91 U/L (ref 39–117)
BUN: 16 mg/dL (ref 6–23)
CO2: 28 mEq/L (ref 19–32)
Chloride: 107 mEq/L (ref 96–112)
GFR calc Af Amer: >90 mL/min (ref >90)
GFR calc non Af Amer: >90 mL/min (ref >90)
Glucose, Bld: 102 mg/dL — ABNORMAL HIGH (ref 70–99)
Potassium: 4.2 mEq/L (ref 3.5–5.1)
Total Bilirubin: 0.2 mg/dL — ABNORMAL LOW (ref 0.3–1.2)

## 2012-06-13 LAB — POCT I-STAT TROPONIN I: Troponin i, poc: 0.01 ng/mL (ref 0.00–0.08)

## 2012-06-13 LAB — ETHANOL: Alcohol, Ethyl (B): 171 mg/dL — ABNORMAL HIGH (ref 0–11)

## 2012-06-13 LAB — POCT PREGNANCY, URINE: Preg Test, Ur: NEGATIVE

## 2012-06-13 LAB — RAPID URINE DRUG SCREEN, HOSP PERFORMED
Barbiturates: NOT DETECTED
Benzodiazepines: POSITIVE — AB

## 2012-06-13 LAB — ACETAMINOPHEN LEVEL: Acetaminophen (Tylenol), Serum: <15 ug/mL (ref 10–30)

## 2012-06-13 MED ORDER — HYDROCODONE-ACETAMINOPHEN 5-325 MG PO TABS: 1.0000 | ORAL_TABLET | Freq: Four times a day (QID) | ORAL | Status: DC | PRN

## 2012-06-13 MED ORDER — METHOCARBAMOL 500 MG PO TABS: 500.0000 mg | ORAL_TABLET | Freq: Two times a day (BID) | ORAL | Status: DC

## 2012-06-13 MED ORDER — LISINOPRIL 10 MG PO TABS
10.0000 mg | ORAL_TABLET | Freq: Every day | ORAL | Status: DC
  Administered 2012-06-13: 10 mg via ORAL
  Filled 2012-06-13: qty 1

## 2012-06-13 MED ORDER — ACETAMINOPHEN 325 MG PO TABS: 650.0000 mg | ORAL_TABLET | ORAL | Status: DC | PRN

## 2012-06-13 MED ORDER — AMITRIPTYLINE HCL 25 MG PO TABS: 25.0000 mg | ORAL_TABLET | Freq: Every day | ORAL | Status: DC

## 2012-06-13 MED ORDER — ALUM & MAG HYDROXIDE-SIMETH 200-200-20 MG/5ML PO SUSP: 30.0000 mL | ORAL | Status: DC | PRN

## 2012-06-13 MED ORDER — GABAPENTIN 300 MG PO CAPS
300.0000 mg | ORAL_CAPSULE | Freq: Three times a day (TID) | ORAL | Status: DC
  Administered 2012-06-13 (×2): 300 mg via ORAL
  Filled 2012-06-13 (×2): qty 1

## 2012-06-13 MED ORDER — HYDROCODONE-ACETAMINOPHEN 5-325 MG PO TABS: 1.0000 | ORAL_TABLET | Freq: Once | ORAL | Status: DC

## 2012-06-13 MED ORDER — NICOTINE 21 MG/24HR TD PT24
21.0000 mg | MEDICATED_PATCH | Freq: Every day | TRANSDERMAL | Status: DC
  Administered 2012-06-13: 21 mg via TRANSDERMAL
  Filled 2012-06-13: qty 1

## 2012-06-13 MED ORDER — AMLODIPINE BESYLATE 2.5 MG PO TABS
2.5000 mg | ORAL_TABLET | Freq: Every day | ORAL | Status: DC
  Administered 2012-06-13: 2.5 mg via ORAL
  Filled 2012-06-13: qty 1

## 2012-06-13 MED ORDER — LORAZEPAM 1 MG PO TABS
1.0000 mg | ORAL_TABLET | Freq: Three times a day (TID) | ORAL | Status: DC | PRN
  Administered 2012-06-13: 1 mg via ORAL
  Filled 2012-06-13: qty 1

## 2012-06-13 MED ORDER — CITALOPRAM HYDROBROMIDE 10 MG PO TABS
40.0000 mg | ORAL_TABLET | Freq: Every day | ORAL | Status: DC
  Administered 2012-06-13: 40 mg via ORAL
  Filled 2012-06-13: qty 4

## 2012-06-13 MED ORDER — ONDANSETRON HCL 8 MG PO TABS: 4.0000 mg | ORAL_TABLET | Freq: Three times a day (TID) | ORAL | Status: DC | PRN

## 2012-06-13 MED ORDER — SIMVASTATIN 40 MG PO TABS
40.0000 mg | ORAL_TABLET | Freq: Every evening | ORAL | Status: DC
  Administered 2012-06-13: 40 mg via ORAL
  Filled 2012-06-13: qty 1

## 2012-06-13 MED ORDER — ASPIRIN EC 81 MG PO TBEC
81.0000 mg | DELAYED_RELEASE_TABLET | Freq: Every day | ORAL | Status: DC
  Administered 2012-06-13: 81 mg via ORAL
  Filled 2012-06-13: qty 1

## 2012-06-13 MED ORDER — IBUPROFEN 600 MG PO TABS: 600.0000 mg | ORAL_TABLET | Freq: Four times a day (QID) | ORAL | Status: DC | PRN

## 2012-06-13 MED ORDER — TRAMADOL HCL 50 MG PO TABS
50.0000 mg | ORAL_TABLET | Freq: Four times a day (QID) | ORAL | Status: DC | PRN
  Administered 2012-06-13: 50 mg via ORAL
  Filled 2012-06-13: qty 1

## 2012-06-13 MED ORDER — HYDROCODONE-ACETAMINOPHEN 5-325 MG PO TABS
2.0000 | ORAL_TABLET | Freq: Once | ORAL | Status: AC
  Administered 2012-06-13: 2 via ORAL
  Filled 2012-06-13: qty 2
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
## 2012-06-13 NOTE — ED Notes (Signed)
Pt resting comfortably. On continuous pulse ox and BP. VSS

## 2012-06-13 NOTE — ED Notes (Signed)
Breakfast ordered 
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
## 2012-06-13 NOTE — ED Notes (Signed)
Pt placed in paper scrubs, belongings bagged and placed with RN. Pt given haldol. She is resting and cooperative now. Given drink and warm blanket.
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
## 2012-06-13 NOTE — ED Notes (Signed)
Pt given sprite and water. She is calm and cooperative at this time.

## 2012-06-13 NOTE — ED Provider Notes (Signed)
I saw and evaluated the patient, reviewed the resident's note and I agree with the findings and plan.   .Face to face Exam:  General:  Awake HEENT:  Atraumatic Resp:  Normal effort Abd:  Nondistended Neuro:No focal weakness Lymph: No adenopathy  Nelia Shi, MD 06/13/12 2334

## 2012-06-13 NOTE — ED Notes (Signed)
Walked pt. To the front.  Pt. Got into the Cab.
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
## 2012-06-13 NOTE — ED Notes (Signed)
DR. Rhunette Croft at the bedside speaking with pt.

## 2012-06-13 NOTE — ED Notes (Signed)
Pt. Continues to be discomfort with her back pain.  Pt. Also attempted to call friends for assistance, which no one was able to help.  Pt. Very anxious.  Medicated with Ativan per orders

## 2012-06-13 NOTE — ED Notes (Signed)
Pt. oob to the bathroom , Tramadol ineffective. Will report to Dr. Luetta Nutting

## 2012-06-13 NOTE — ED Notes (Signed)
Spoke with PJ Minnich , she will speak with our Child psychotherapist and have her come to speak with pt.

## 2012-06-13 NOTE — ED Notes (Signed)
Pt tearful. Stating she has no where to go, crying that she does not want to go to [facility].

## 2012-06-23 ENCOUNTER — Encounter (HOSPITAL_COMMUNITY): Payer: No Typology Code available for payment source

## 2012-06-23 ENCOUNTER — Encounter (HOSPITAL_COMMUNITY): Payer: Self-pay | Admitting: Radiology

## 2012-06-23 NOTE — Progress Notes (Signed)
Patient ID: Brenda [name], female   DOB: 12-Nov-1960, 52 y.o.   MRN: [id number]  Patient was scheduled for a Lexiscan Myoview today and No Showed. She was seen in hospital on 06/12/12 for chest pain. This was ordered by B.Crenshaw, but f/u with Dr. Shirlee Latch, her Cardiologist in the past. A message was left on her Answering Machine today to call our office to reschedule this appt.  [name], RT-N

## 2012-06-24 IMAGING — CR DG CHEST 1V PORT
1 series · 1 of 1 positions shown · non-contrast
Comparison: Chest radiograph performed 12/12/2009

CLINICAL DATA: Mid chest pain; pain between shoulders.  History of
myocardial infarction.

PORTABLE CHEST - 1 VIEW

[AP]
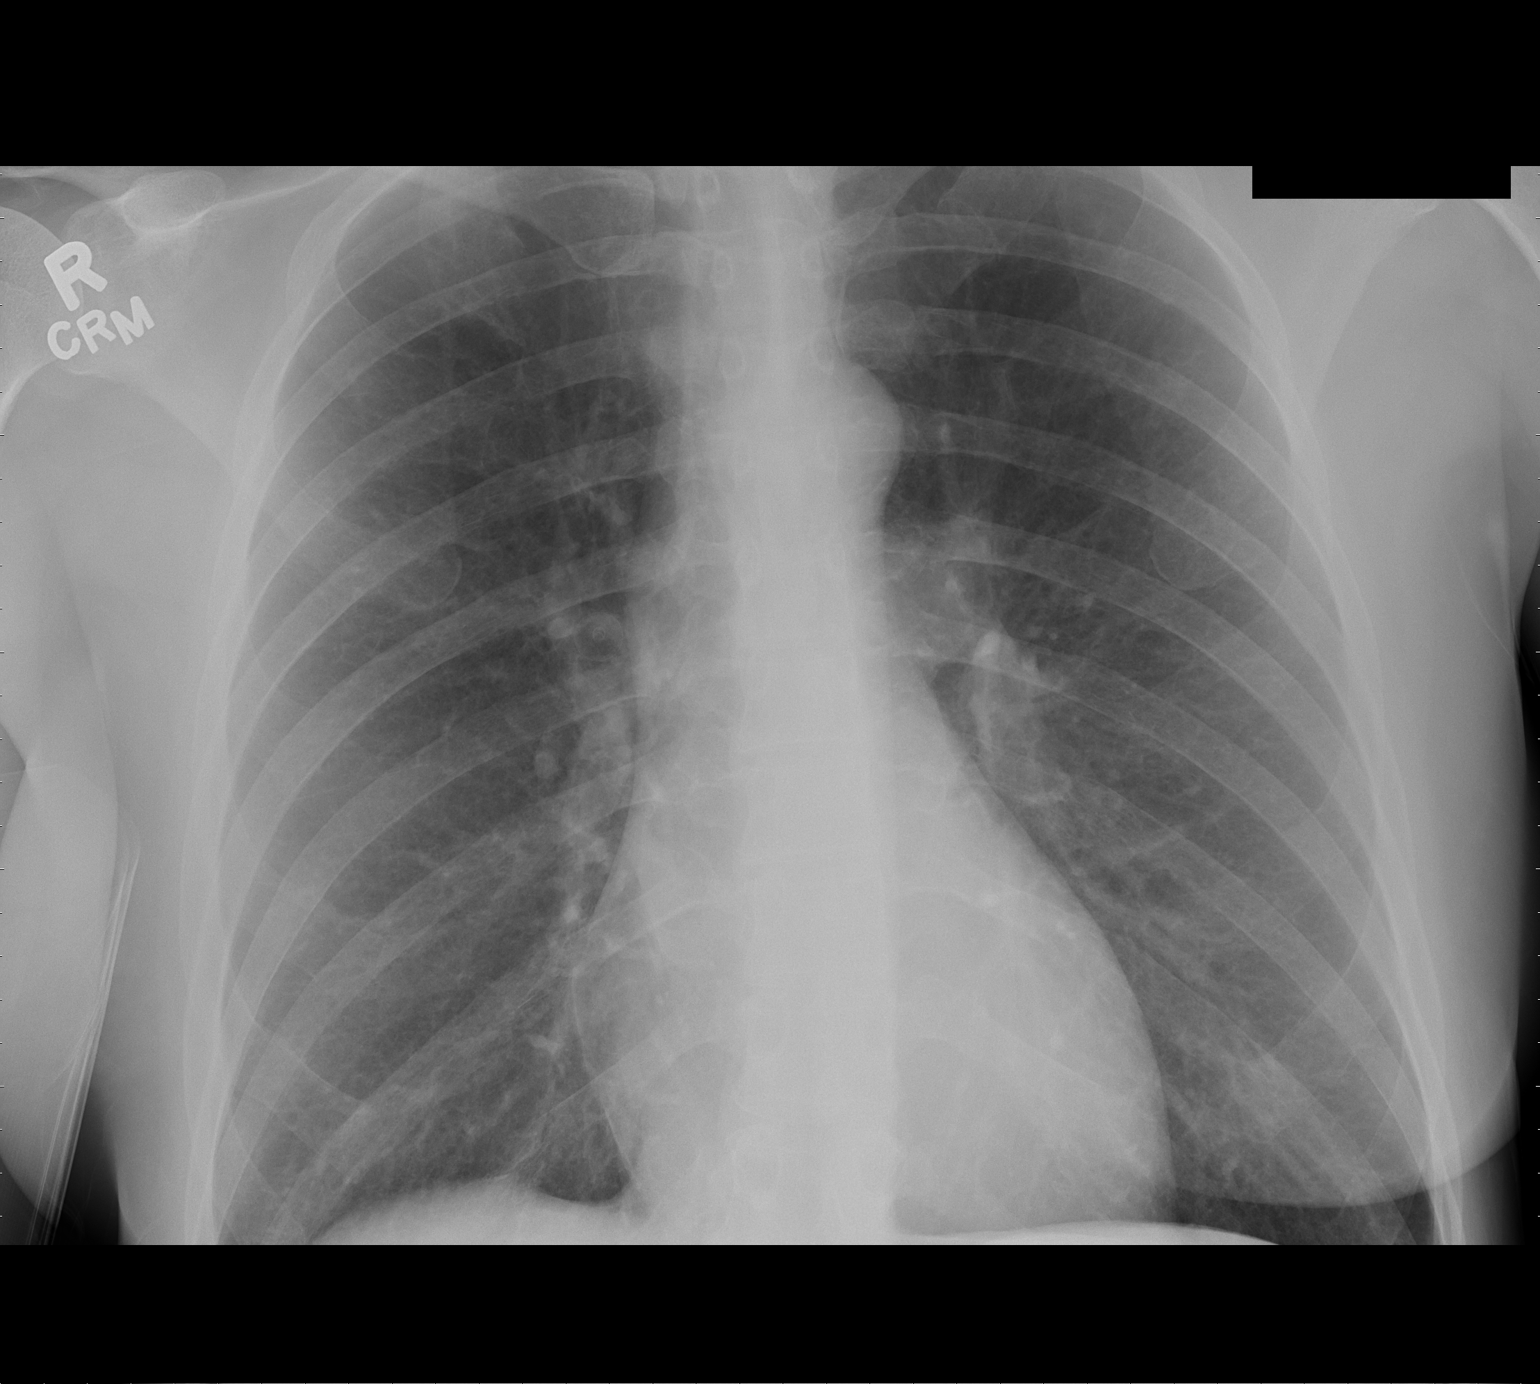

[1 of 1 positions shown; findings below may reference images not displayed]

FINDINGS: The lungs are well-aerated and clear.  There is no
evidence of focal opacification, pleural effusion or pneumothorax.

The cardiomediastinal silhouette is within normal limits.  No acute
osseous abnormalities are seen.
IMPRESSION: No acute cardiopulmonary process seen.

## 2012-06-27 ENCOUNTER — Encounter: Payer: No Typology Code available for payment source | Admitting: Physician Assistant

## 2012-07-03 IMAGING — CR DG CHEST 2V
2 series · 2 of 2 positions shown · non-contrast
Comparison: Right rib series 02/05/2010 and chest radiograph
01/27/2010.

CLINICAL DATA: Right flank pain

CHEST - 2 VIEW

[w chest pa]
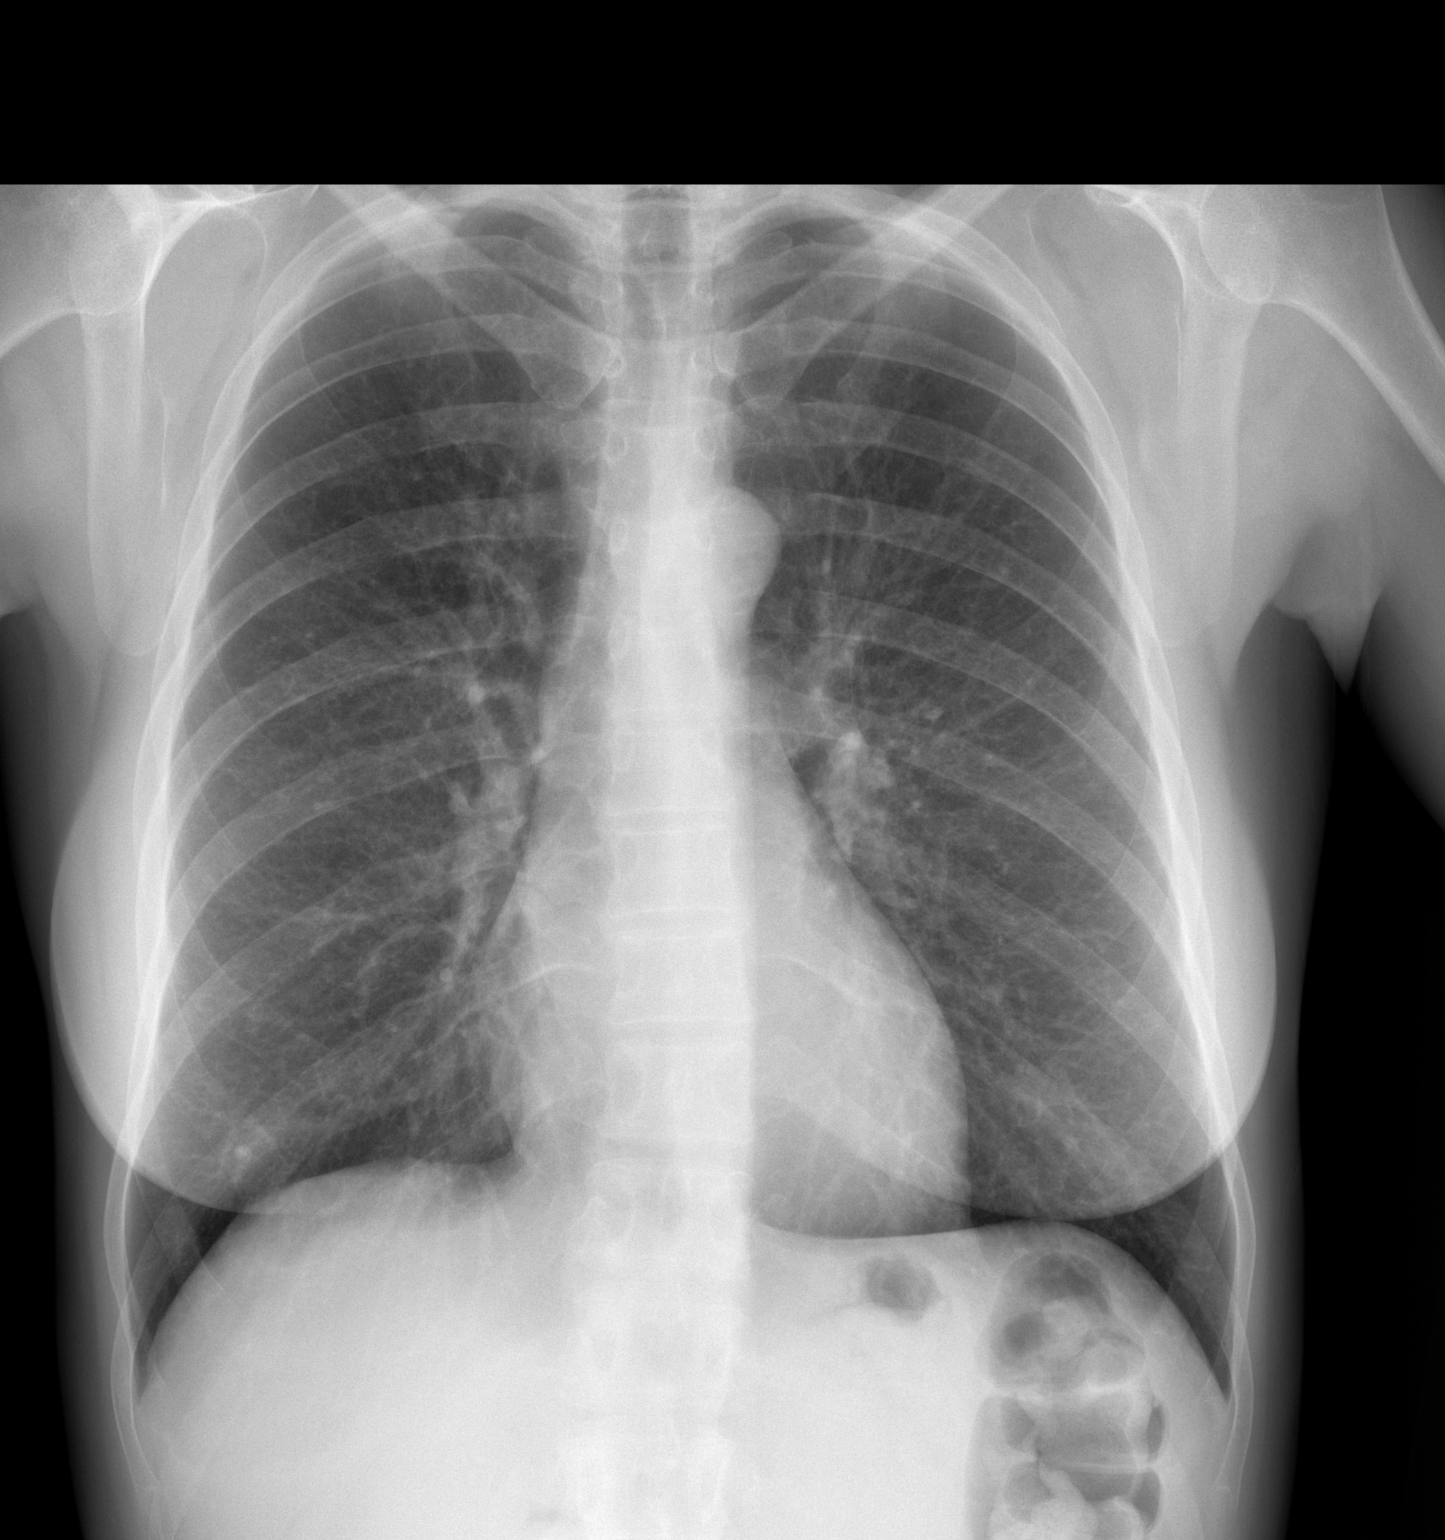

[w chest lat]
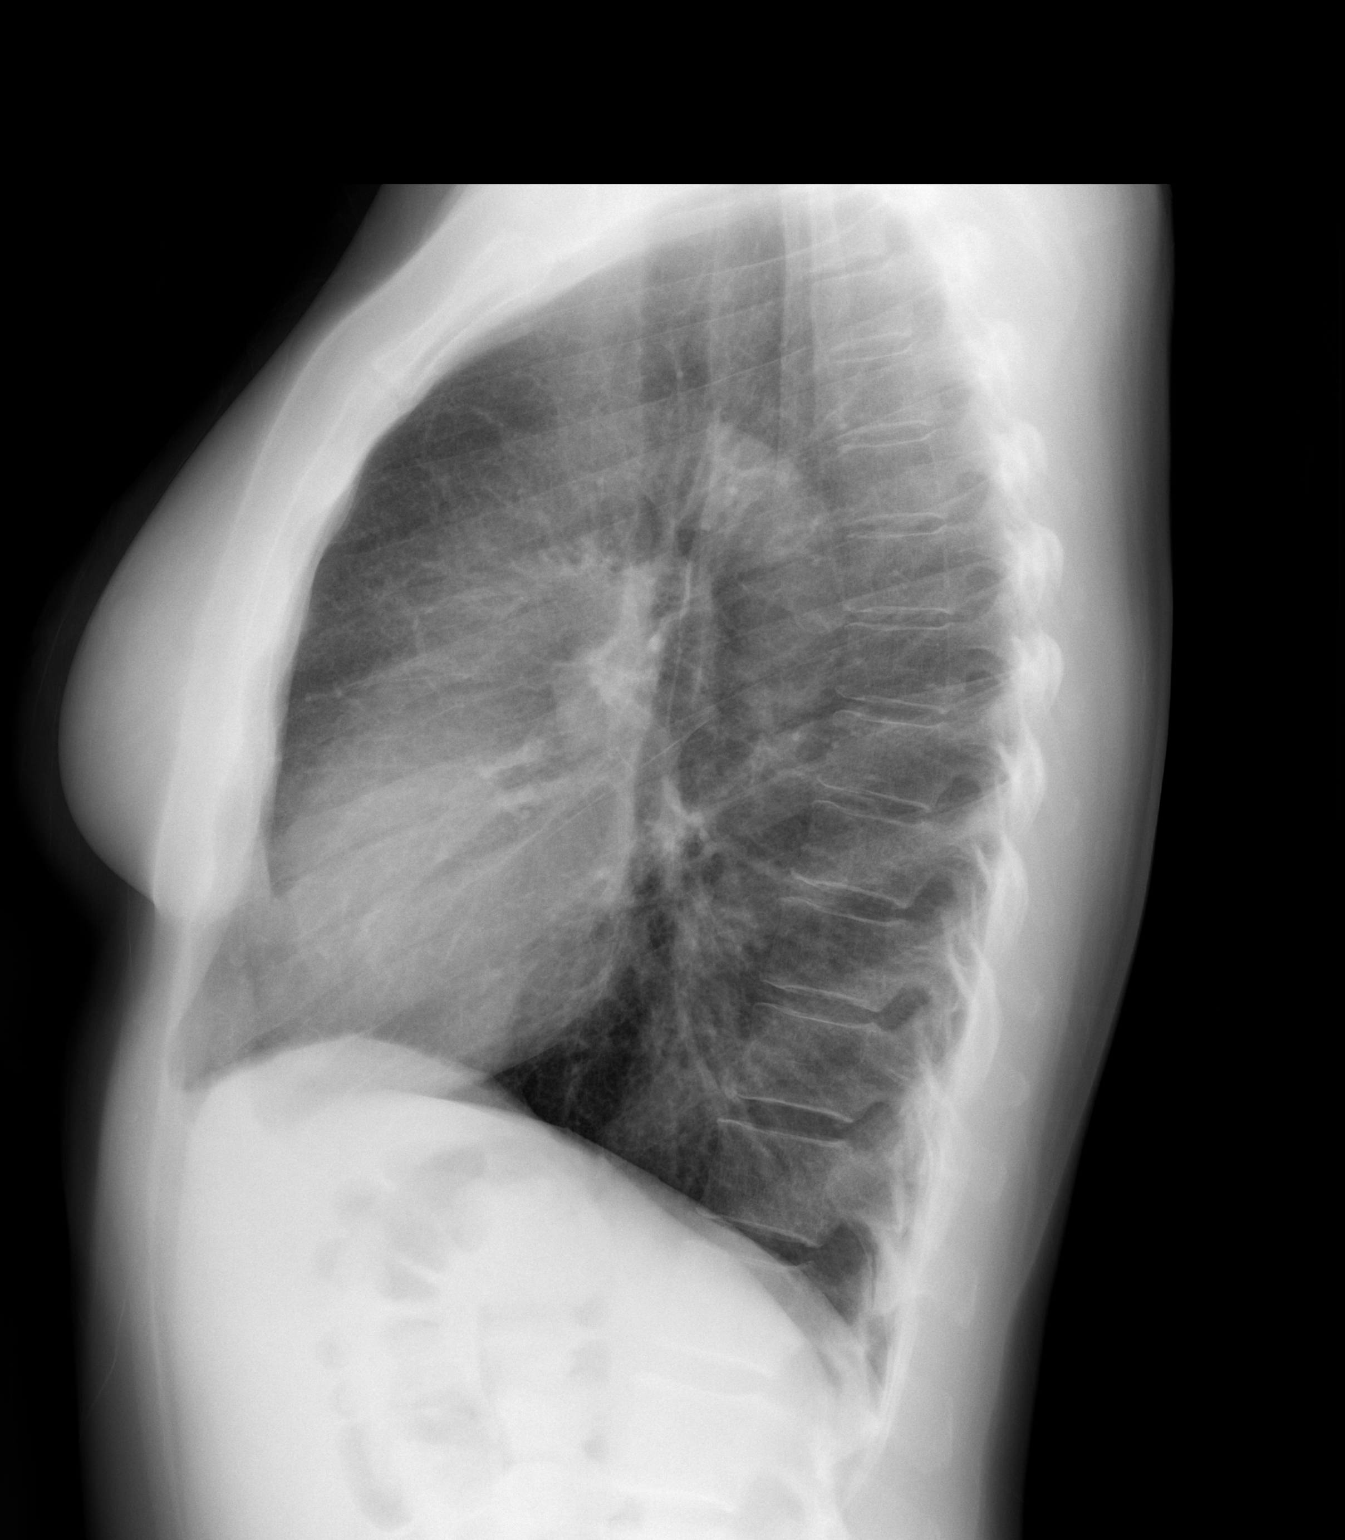

[2 of 2 positions shown; findings below may reference images not displayed]

FINDINGS: The heart and mediastinal contours are normal.  The
trachea is midline.  Pulmonary vascularity is normal and the lungs
are clear.  No airspace disease, effusion, or pneumothorax.  The
bony thorax is unremarkable.
IMPRESSION: No acute cardiopulmonary disease.

## 2012-07-03 IMAGING — US US ABDOMEN COMPLETE
1 series · 14 of 25 positions shown · non-contrast
Comparison: CT abdomen and pelvis 01/15/2010.

CLINICAL DATA: Flank pain.  Pyelonephritis.

COMPLETE ABDOMINAL ULTRASOUND

[Series 1: us abdomen complete · 0.26mm/px · 14 of 68 slices shown]
[im 1/68]
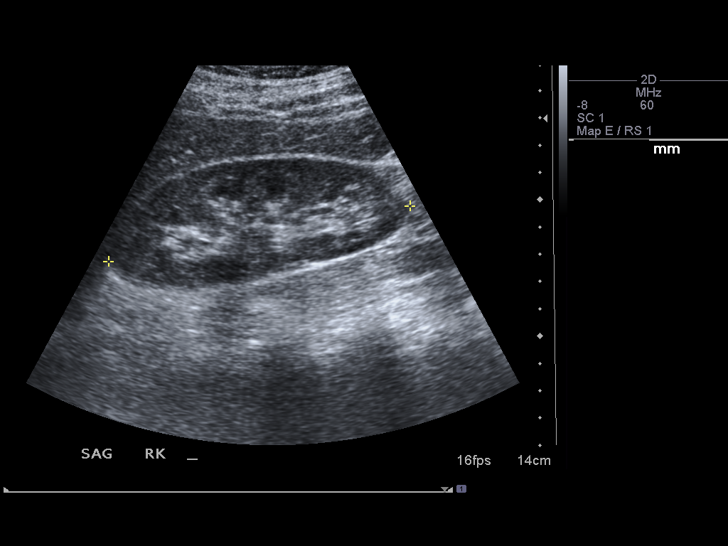
[im 6/68]
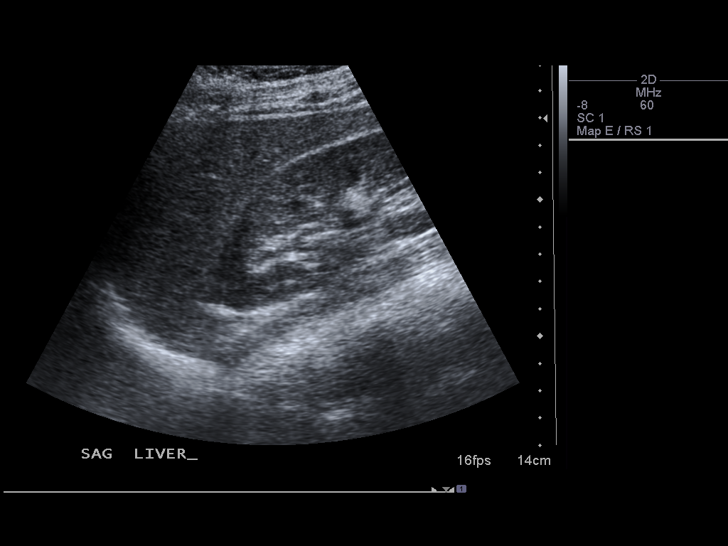
[im 12/68]
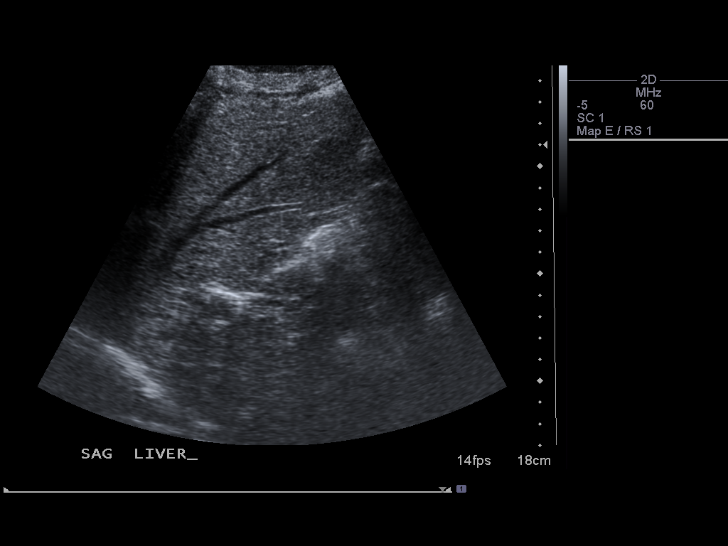
[im 17/68]
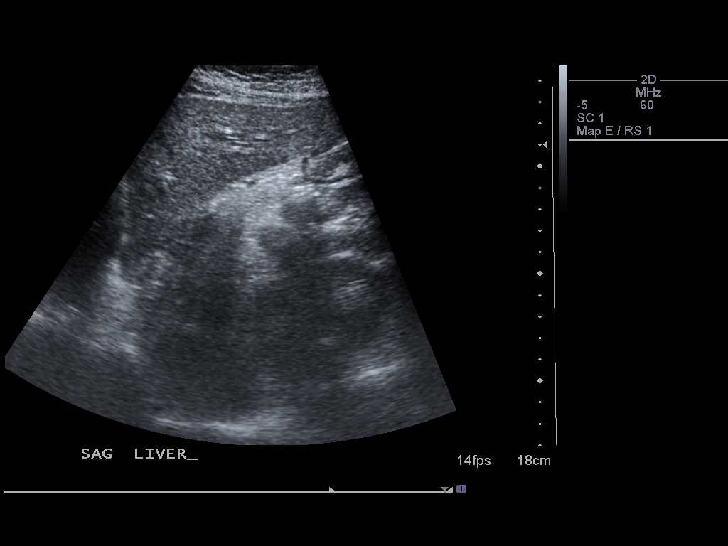
[im 23/68]
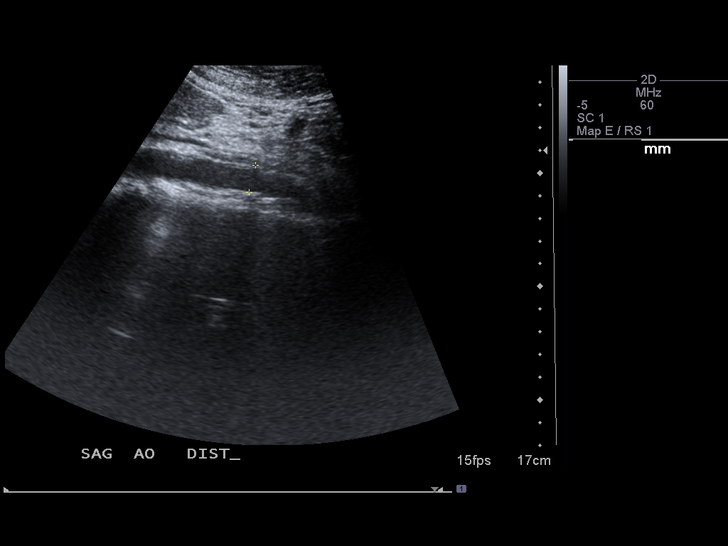
[im 26/68]
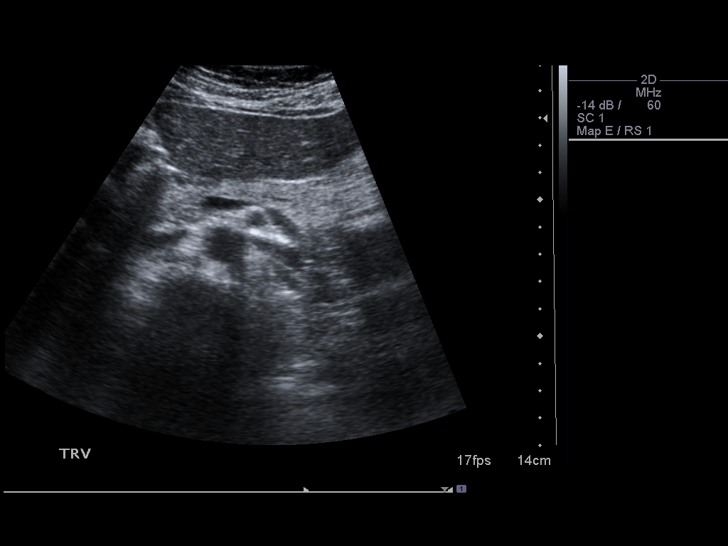
[im 31/68]
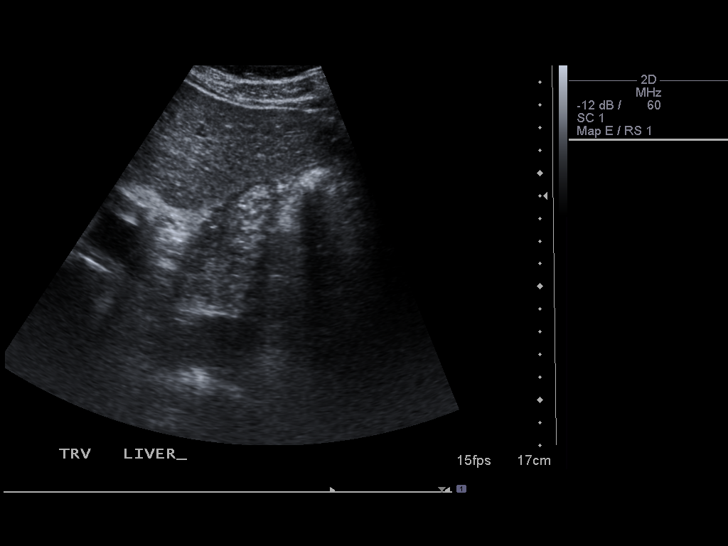
[im 37/68]
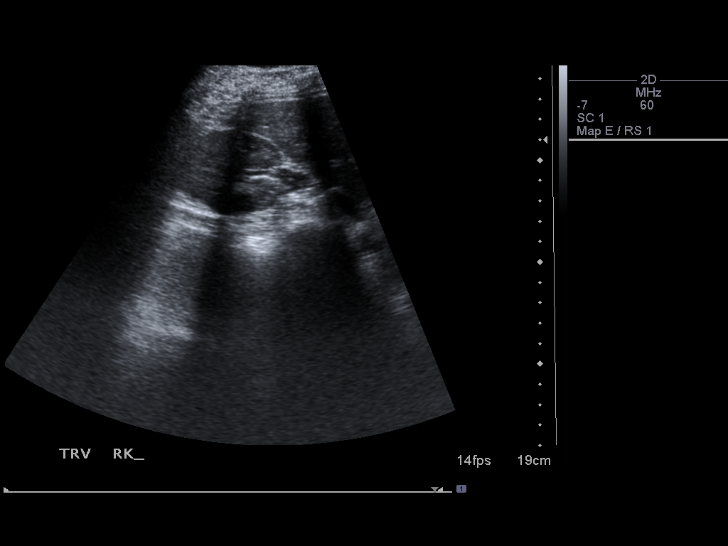
[im 42/68]
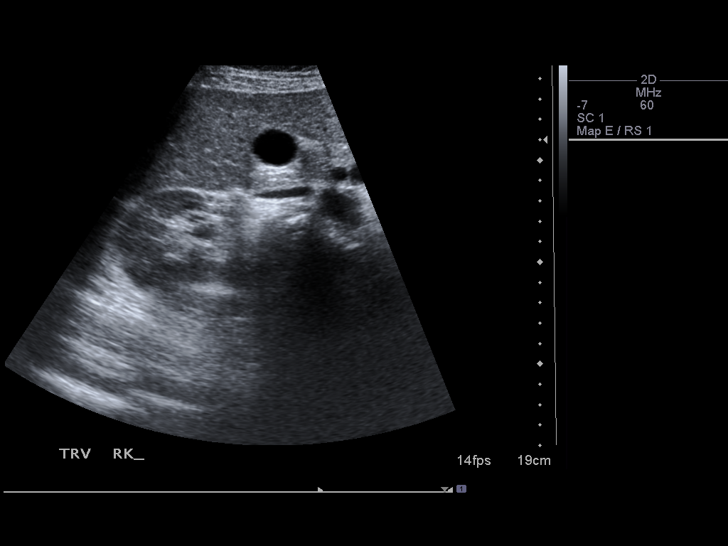
[im 45/68]
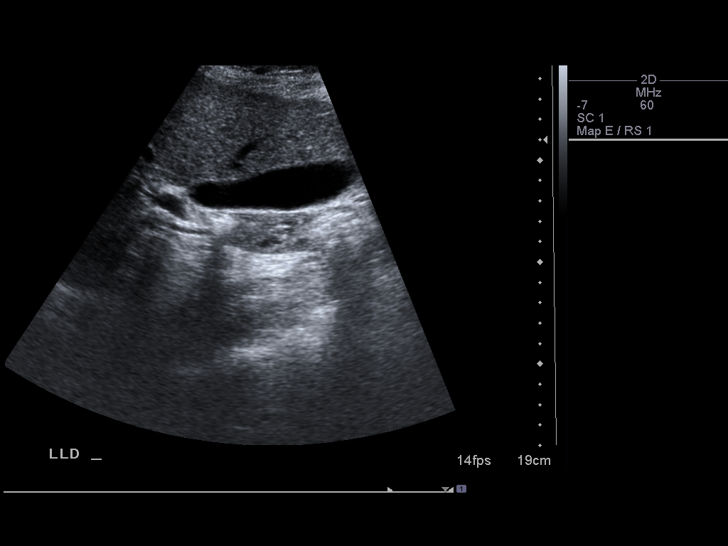
[im 51/68]
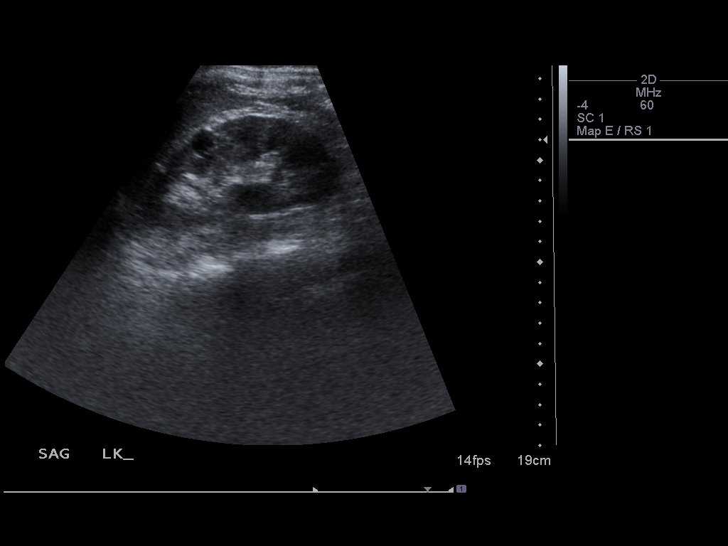
[im 56/68]
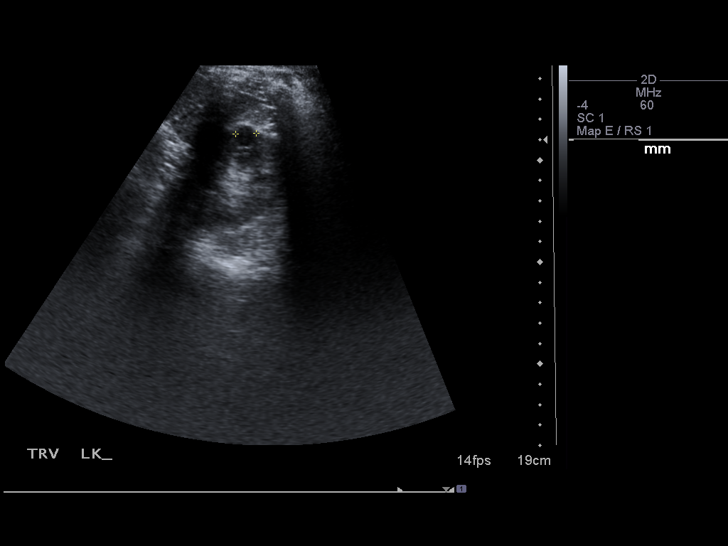
[im 62/68]
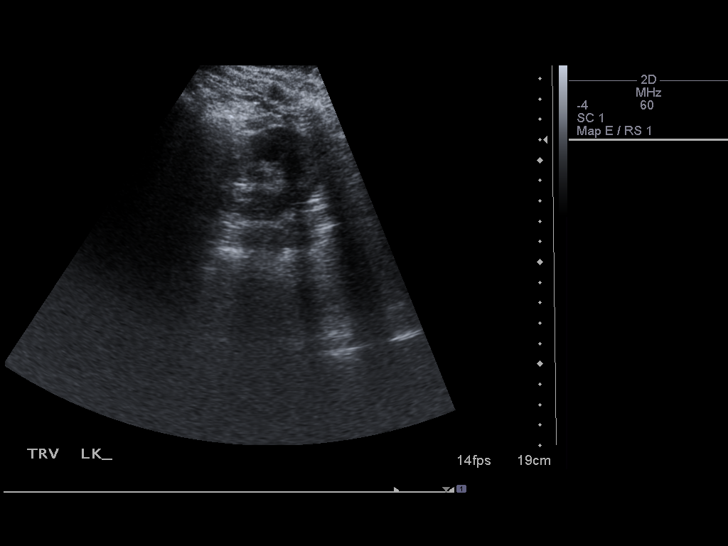
[im 68/68]
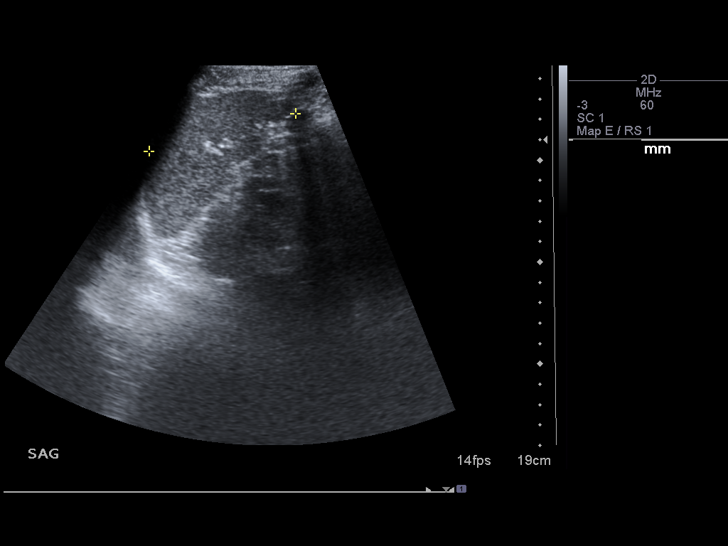

[14 of 25 positions shown; findings below may reference images not displayed]

FINDINGS: Gallbladder:  No gallstones, gallbladder wall thickening, or
pericholecystic fluid. Sonographer reports negative Murphy's sign.

Common bile duct:  Measures 0.7 cm and appears normal.

Liver:  No focal lesion identified.  Within normal limits in
parenchymal echogenicity.

IVC:  Appears normal.

Pancreas:  No focal abnormality seen.

Spleen:  Measures 7.4 cm and appears normal.

Right Kidney:  Measures 10.5 cm and appears normal.

Left Kidney:  Measures 10.8 cm.  Simple 1.3 cm cyst in the upper
pole is noted.

Abdominal aorta:  No aneurysm identified.
IMPRESSION: No acute finding.  1.3 cm left renal cyst noted.

## 2012-07-03 IMAGING — CR DG RIBS 2V*R*
5 series · 5 of 5 positions shown · non-contrast
Comparison: 01/27/2010

CLINICAL DATA: Right rib and chest pain.

RIGHT RIBS - 2 VIEW

[w ribs ap/pa upper right]
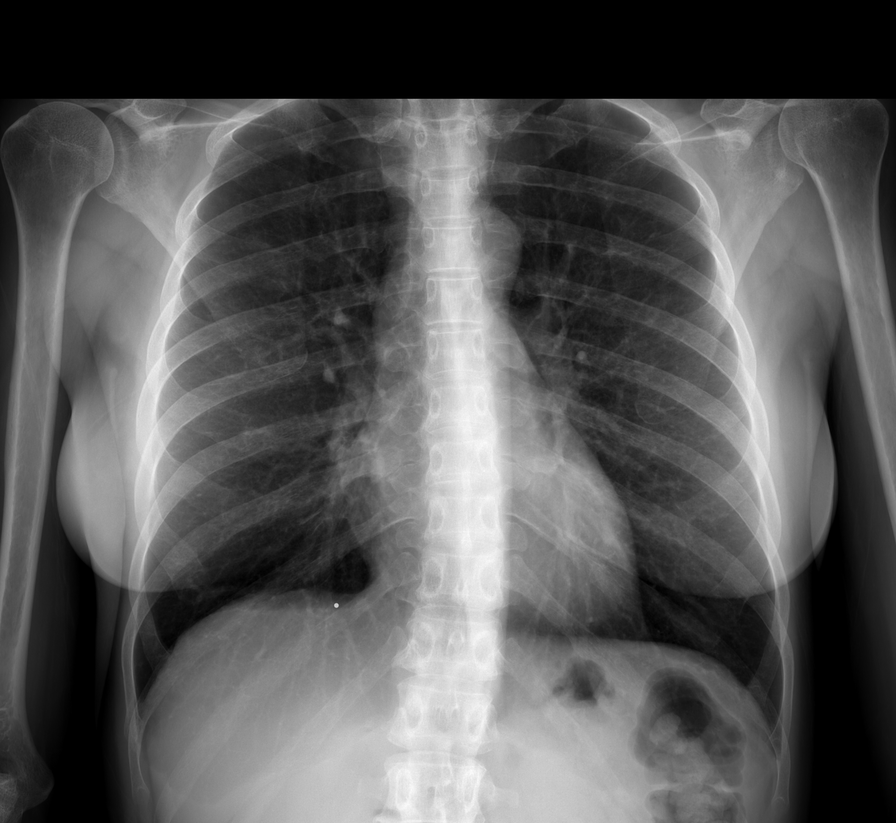

[w ribs ap/pa lower right (1 of 2)]
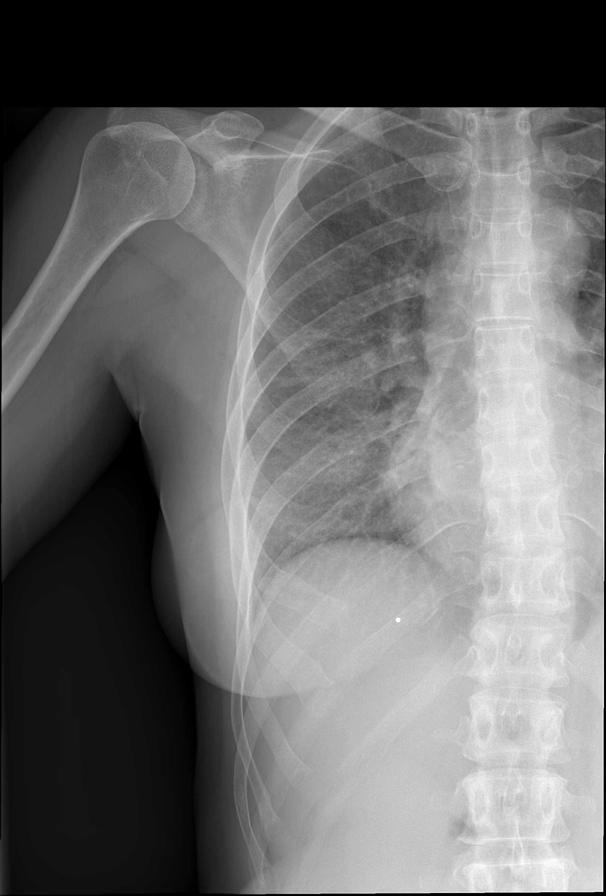

[w ribs ap/pa lower right (2 of 2)]
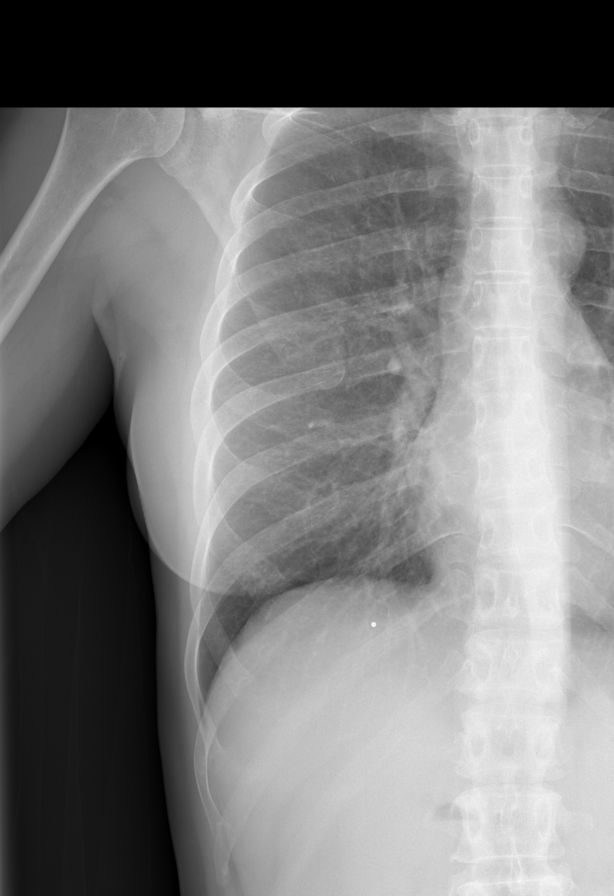

[w ribs oblique right (1 of 2)]
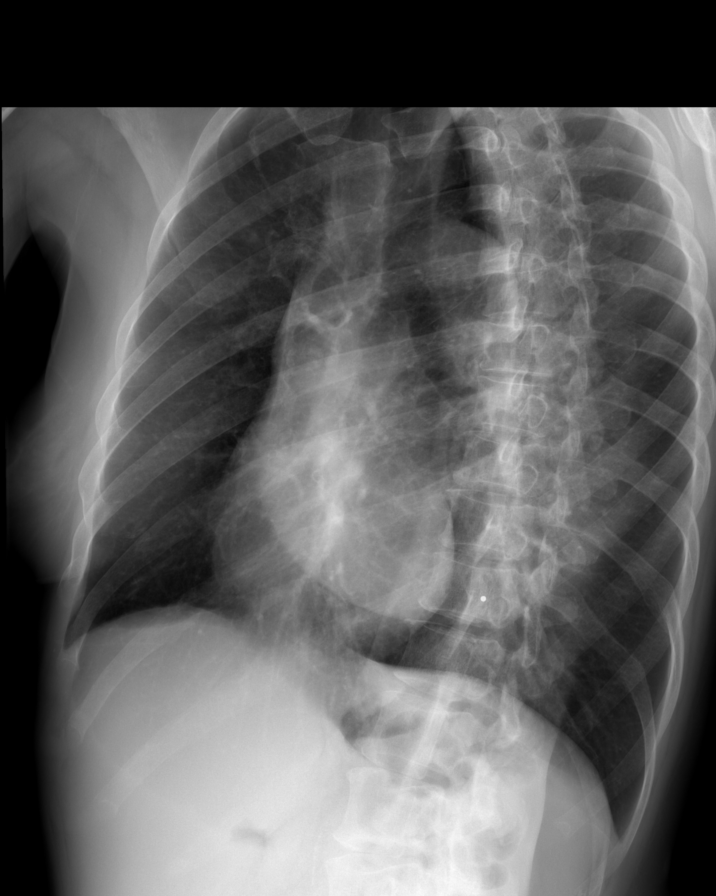

[w ribs oblique right (2 of 2)]
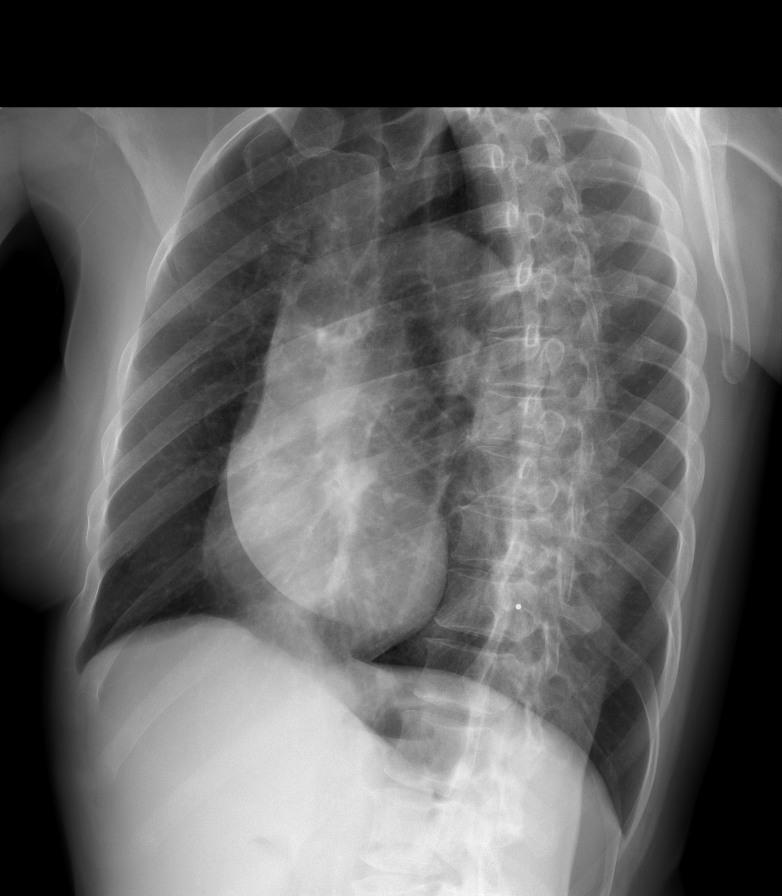

[5 of 5 positions shown; findings below may reference images not displayed]

FINDINGS: Trachea is midline.  Heart size normal.  Lungs are clear.
No pleural fluid.  No definite acute rib fracture.
IMPRESSION: No acute findings.

## 2012-07-06 ENCOUNTER — Encounter (HOSPITAL_COMMUNITY): Payer: Self-pay | Admitting: *Deleted

## 2012-07-06 ENCOUNTER — Emergency Department (HOSPITAL_COMMUNITY)
Admission: EM | Admit: 2012-07-06 | Discharge: 2012-07-06 | Disposition: A | Discharge status: Home / Self Care | Payer: No Typology Code available for payment source | Attending: Emergency Medicine | Admitting: Emergency Medicine

## 2012-07-06 ENCOUNTER — Emergency Department (HOSPITAL_COMMUNITY): Payer: No Typology Code available for payment source

## 2012-07-06 DIAGNOSIS — Z7982 Long term (current) use of aspirin: Secondary | ICD-10-CM | POA: Insufficient documentation

## 2012-07-06 DIAGNOSIS — Z8719 Personal history of other diseases of the digestive system: Secondary | ICD-10-CM | POA: Insufficient documentation

## 2012-07-06 DIAGNOSIS — I2510 Atherosclerotic heart disease of native coronary artery without angina pectoris: Secondary | ICD-10-CM | POA: Insufficient documentation

## 2012-07-06 DIAGNOSIS — F17200 Nicotine dependence, unspecified, uncomplicated: Secondary | ICD-10-CM | POA: Insufficient documentation

## 2012-07-06 DIAGNOSIS — Z8739 Personal history of other diseases of the musculoskeletal system and connective tissue: Secondary | ICD-10-CM | POA: Insufficient documentation

## 2012-07-06 DIAGNOSIS — IMO0002 Reserved for concepts with insufficient information to code with codable children: Secondary | ICD-10-CM | POA: Insufficient documentation

## 2012-07-06 DIAGNOSIS — Z87448 Personal history of other diseases of urinary system: Secondary | ICD-10-CM | POA: Insufficient documentation

## 2012-07-06 DIAGNOSIS — I10 Essential (primary) hypertension: Secondary | ICD-10-CM | POA: Insufficient documentation

## 2012-07-06 DIAGNOSIS — E785 Hyperlipidemia, unspecified: Secondary | ICD-10-CM | POA: Insufficient documentation

## 2012-07-06 DIAGNOSIS — I252 Old myocardial infarction: Secondary | ICD-10-CM | POA: Insufficient documentation

## 2012-07-06 DIAGNOSIS — Z87828 Personal history of other (healed) physical injury and trauma: Secondary | ICD-10-CM | POA: Insufficient documentation

## 2012-07-06 DIAGNOSIS — F3289 Other specified depressive episodes: Secondary | ICD-10-CM | POA: Insufficient documentation

## 2012-07-06 DIAGNOSIS — M549 Dorsalgia, unspecified: Secondary | ICD-10-CM

## 2012-07-06 DIAGNOSIS — F329 Major depressive disorder, single episode, unspecified: Secondary | ICD-10-CM | POA: Insufficient documentation

## 2012-07-06 DIAGNOSIS — S60311A Abrasion of right thumb, initial encounter: Secondary | ICD-10-CM

## 2012-07-06 DIAGNOSIS — Z8542 Personal history of malignant neoplasm of other parts of uterus: Secondary | ICD-10-CM | POA: Insufficient documentation

## 2012-07-06 DIAGNOSIS — T148XXA Other injury of unspecified body region, initial encounter: Secondary | ICD-10-CM | POA: Insufficient documentation

## 2012-07-06 DIAGNOSIS — F411 Generalized anxiety disorder: Secondary | ICD-10-CM | POA: Insufficient documentation

## 2012-07-06 DIAGNOSIS — Z79899 Other long term (current) drug therapy: Secondary | ICD-10-CM | POA: Insufficient documentation

## 2012-07-06 DIAGNOSIS — Z8673 Personal history of transient ischemic attack (TIA), and cerebral infarction without residual deficits: Secondary | ICD-10-CM | POA: Insufficient documentation

## 2012-07-06 MED ORDER — SODIUM CHLORIDE 0.9 % IV SOLN
INTRAVENOUS | Status: DC
  Administered 2012-07-06: 01:00:00 via INTRAVENOUS

## 2012-07-06 MED ORDER — KETOROLAC TROMETHAMINE 30 MG/ML IJ SOLN
30.0000 mg | Freq: Once | INTRAMUSCULAR | Status: AC
  Administered 2012-07-06: 30 mg via INTRAVENOUS
  Filled 2012-07-06: qty 1

## 2012-07-06 MED ORDER — PROMETHAZINE HCL 25 MG/ML IJ SOLN
25.0000 mg | Freq: Once | INTRAMUSCULAR | Status: AC
  Administered 2012-07-06: 25 mg via INTRAVENOUS
  Filled 2012-07-06: qty 1
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
## 2012-07-06 NOTE — ED Notes (Signed)
EMS vitals: 162/98, 96 HR, 100% RA, 20R.
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
## 2012-07-06 NOTE — ED Notes (Signed)
Pt was upset and walked out of the room leaving AMA.  She refused to sign any AMA papers.  Her IV was taken out of the left [facility]  She was escorted by Tax adviser off the hall.
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
## 2012-07-06 NOTE — ED Notes (Signed)
I advised the patient that I agreed that she has a right to express her concerns about her pain management, but I have received several complaints from pod c that her screaming and cursing is waking them up.

## 2012-07-06 NOTE — ED Notes (Signed)
Pt yelled that she did not want to take the medications the MD prescribed her and wanted something stronger or else she was going to leave.  I explained to her that we are giving her these medications to help her and the MD wants to see the results of the scans before he prescribes anything else.  She was upset but finally complied.

## 2012-07-06 NOTE — ED Notes (Signed)
Pt ambulated out of bed trying to get to purse because she was unhappy with the pain medications we were giving her and she was going to take her own medications.  She is yelling, screaming and will not calm down.  Security is present.

## 2012-07-17 ENCOUNTER — Encounter (HOSPITAL_COMMUNITY): Payer: Self-pay | Admitting: Physical Medicine and Rehabilitation

## 2012-07-17 ENCOUNTER — Emergency Department (HOSPITAL_COMMUNITY)
Admission: EM | Admit: 2012-07-17 | Discharge: 2012-07-18 | Disposition: A | Discharge status: Other Acute Inpatient Hospital | Payer: No Typology Code available for payment source | Attending: Emergency Medicine | Admitting: Emergency Medicine

## 2012-07-17 DIAGNOSIS — Z8742 Personal history of other diseases of the female genital tract: Secondary | ICD-10-CM | POA: Insufficient documentation

## 2012-07-17 DIAGNOSIS — IMO0002 Reserved for concepts with insufficient information to code with codable children: Secondary | ICD-10-CM | POA: Insufficient documentation

## 2012-07-17 DIAGNOSIS — T7491XD Unspecified adult maltreatment, confirmed, subsequent encounter: Secondary | ICD-10-CM

## 2012-07-17 DIAGNOSIS — K529 Noninfective gastroenteritis and colitis, unspecified: Secondary | ICD-10-CM

## 2012-07-17 DIAGNOSIS — Z8739 Personal history of other diseases of the musculoskeletal system and connective tissue: Secondary | ICD-10-CM | POA: Insufficient documentation

## 2012-07-17 DIAGNOSIS — F1910 Other psychoactive substance abuse, uncomplicated: Secondary | ICD-10-CM | POA: Insufficient documentation

## 2012-07-17 DIAGNOSIS — Z3202 Encounter for pregnancy test, result negative: Secondary | ICD-10-CM | POA: Insufficient documentation

## 2012-07-17 DIAGNOSIS — F17200 Nicotine dependence, unspecified, uncomplicated: Secondary | ICD-10-CM | POA: Insufficient documentation

## 2012-07-17 DIAGNOSIS — Z79899 Other long term (current) drug therapy: Secondary | ICD-10-CM | POA: Insufficient documentation

## 2012-07-17 DIAGNOSIS — F419 Anxiety disorder, unspecified: Secondary | ICD-10-CM

## 2012-07-17 DIAGNOSIS — Z7982 Long term (current) use of aspirin: Secondary | ICD-10-CM | POA: Insufficient documentation

## 2012-07-17 DIAGNOSIS — F411 Generalized anxiety disorder: Secondary | ICD-10-CM | POA: Insufficient documentation

## 2012-07-17 DIAGNOSIS — E785 Hyperlipidemia, unspecified: Secondary | ICD-10-CM | POA: Insufficient documentation

## 2012-07-17 DIAGNOSIS — Z8673 Personal history of transient ischemic attack (TIA), and cerebral infarction without residual deficits: Secondary | ICD-10-CM | POA: Insufficient documentation

## 2012-07-17 DIAGNOSIS — F329 Major depressive disorder, single episode, unspecified: Secondary | ICD-10-CM | POA: Insufficient documentation

## 2012-07-17 DIAGNOSIS — F39 Unspecified mood [affective] disorder: Secondary | ICD-10-CM | POA: Insufficient documentation

## 2012-07-17 DIAGNOSIS — Z87448 Personal history of other diseases of urinary system: Secondary | ICD-10-CM | POA: Insufficient documentation

## 2012-07-17 DIAGNOSIS — F3289 Other specified depressive episodes: Secondary | ICD-10-CM | POA: Insufficient documentation

## 2012-07-17 DIAGNOSIS — M549 Dorsalgia, unspecified: Secondary | ICD-10-CM | POA: Insufficient documentation

## 2012-07-17 DIAGNOSIS — Z8719 Personal history of other diseases of the digestive system: Secondary | ICD-10-CM | POA: Insufficient documentation

## 2012-07-17 DIAGNOSIS — I252 Old myocardial infarction: Secondary | ICD-10-CM | POA: Insufficient documentation

## 2012-07-17 DIAGNOSIS — G8929 Other chronic pain: Secondary | ICD-10-CM | POA: Insufficient documentation

## 2012-07-17 DIAGNOSIS — Z87828 Personal history of other (healed) physical injury and trauma: Secondary | ICD-10-CM | POA: Insufficient documentation

## 2012-07-17 DIAGNOSIS — I10 Essential (primary) hypertension: Secondary | ICD-10-CM | POA: Insufficient documentation

## 2012-07-17 DIAGNOSIS — R197 Diarrhea, unspecified: Secondary | ICD-10-CM | POA: Insufficient documentation

## 2012-07-17 DIAGNOSIS — I2510 Atherosclerotic heart disease of native coronary artery without angina pectoris: Secondary | ICD-10-CM | POA: Insufficient documentation

## 2012-07-17 LAB — RAPID URINE DRUG SCREEN, HOSP PERFORMED
Barbiturates: NOT DETECTED
Benzodiazepines: POSITIVE — AB
Cocaine: NOT DETECTED
Opiates: NOT DETECTED
Tetrahydrocannabinol: POSITIVE — AB

## 2012-07-17 LAB — URINALYSIS, ROUTINE W REFLEX MICROSCOPIC
Bilirubin Urine: NEGATIVE
Ketones, ur: NEGATIVE mg/dL
Protein, ur: NEGATIVE mg/dL
Urobilinogen, UA: 0.2 mg/dL (ref 0.0–1.0)

## 2012-07-17 LAB — CBC WITH DIFFERENTIAL/PLATELET
Basophils Absolute: 0.1 10*3/uL (ref 0.0–0.1)
Basophils Relative: 1 % (ref 0–1)
Hemoglobin: 13.2 g/dL (ref 12.0–15.0)
MCHC: 35.7 g/dL (ref 30.0–36.0)
Neutro Abs: 5.4 10*3/uL (ref 1.7–7.7)
Neutrophils Relative %: 57 % (ref 43–77)
Platelets: 242 10*3/uL (ref 150–400)
RDW: 13.6 % (ref 11.5–15.5)

## 2012-07-17 LAB — COMPREHENSIVE METABOLIC PANEL
ALT: 6 U/L (ref 0–35)
Albumin: 3.9 g/dL (ref 3.5–5.2)
Alkaline Phosphatase: 84 U/L (ref 39–117)
Calcium: 9.1 mg/dL (ref 8.4–10.5)
Potassium: 3.8 mEq/L (ref 3.5–5.1)
Sodium: 139 mEq/L (ref 135–145)
Total Protein: 7.6 g/dL (ref 6.0–8.3)

## 2012-07-17 LAB — URINE MICROSCOPIC-ADD ON

## 2012-07-17 LAB — ETHANOL: Alcohol, Ethyl (B): <11 mg/dL (ref 0–11)

## 2012-07-17 MED ORDER — LISINOPRIL 10 MG PO TABS
10.0000 mg | ORAL_TABLET | Freq: Every day | ORAL | Status: DC
  Administered 2012-07-17 – 2012-07-18 (×2): 10 mg via ORAL
  Filled 2012-07-17 (×2): qty 1

## 2012-07-17 MED ORDER — AMLODIPINE BESYLATE 2.5 MG PO TABS
2.5000 mg | ORAL_TABLET | Freq: Every day | ORAL | Status: DC
  Administered 2012-07-17 – 2012-07-18 (×2): 2.5 mg via ORAL
  Filled 2012-07-17 (×2): qty 1

## 2012-07-17 MED ORDER — ATORVASTATIN CALCIUM 20 MG PO TABS
20.0000 mg | ORAL_TABLET | Freq: Every day | ORAL | Status: DC
  Administered 2012-07-18: 20 mg via ORAL
  Filled 2012-07-17 (×2): qty 1

## 2012-07-17 MED ORDER — HYDROCHLOROTHIAZIDE 25 MG PO TABS
25.0000 mg | ORAL_TABLET | Freq: Every day | ORAL | Status: DC
  Administered 2012-07-17 – 2012-07-18 (×2): 25 mg via ORAL
  Filled 2012-07-17 (×2): qty 1

## 2012-07-17 MED ORDER — MELOXICAM 15 MG PO TABS
15.0000 mg | ORAL_TABLET | Freq: Every day | ORAL | Status: DC
  Administered 2012-07-17 – 2012-07-18 (×2): 15 mg via ORAL
  Filled 2012-07-17 (×2): qty 1

## 2012-07-17 MED ORDER — AMITRIPTYLINE HCL 25 MG PO TABS
25.0000 mg | ORAL_TABLET | Freq: Every day | ORAL | Status: DC
  Administered 2012-07-17: 25 mg via ORAL
  Filled 2012-07-17: qty 1

## 2012-07-17 MED ORDER — ONDANSETRON HCL 8 MG PO TABS
4.0000 mg | ORAL_TABLET | Freq: Three times a day (TID) | ORAL | Status: DC | PRN
  Administered 2012-07-17: 4 mg via ORAL
  Filled 2012-07-17: qty 1

## 2012-07-17 MED ORDER — NITROGLYCERIN 0.4 MG SL SUBL: 0.4000 mg | SUBLINGUAL_TABLET | SUBLINGUAL | Status: DC | PRN

## 2012-07-17 MED ORDER — METHOCARBAMOL 500 MG PO TABS: 500.0000 mg | ORAL_TABLET | Freq: Three times a day (TID) | ORAL | Status: DC | PRN

## 2012-07-17 MED ORDER — ZOLPIDEM TARTRATE 5 MG PO TABS
5.0000 mg | ORAL_TABLET | Freq: Every evening | ORAL | Status: DC | PRN
  Administered 2012-07-17: 5 mg via ORAL
  Filled 2012-07-17: qty 1

## 2012-07-17 MED ORDER — DICYCLOMINE HCL 20 MG PO TABS
20.0000 mg | ORAL_TABLET | Freq: Three times a day (TID) | ORAL | Status: DC
  Administered 2012-07-17 – 2012-07-18 (×3): 20 mg via ORAL
  Filled 2012-07-17 (×3): qty 1

## 2012-07-17 MED ORDER — SIMVASTATIN 40 MG PO TABS: 40.0000 mg | ORAL_TABLET | Freq: Every evening | ORAL | Status: DC

## 2012-07-17 MED ORDER — SIMVASTATIN 40 MG PO TABS
40.0000 mg | ORAL_TABLET | Freq: Every evening | ORAL | Status: DC
  Filled 2012-07-17: qty 1

## 2012-07-17 MED ORDER — ASPIRIN EC 81 MG PO TBEC
81.0000 mg | DELAYED_RELEASE_TABLET | Freq: Every day | ORAL | Status: DC
  Administered 2012-07-17 – 2012-07-18 (×2): 81 mg via ORAL
  Filled 2012-07-17 (×2): qty 1

## 2012-07-17 MED ORDER — ALUM & MAG HYDROXIDE-SIMETH 200-200-20 MG/5ML PO SUSP: 30.0000 mL | ORAL | Status: DC | PRN

## 2012-07-17 MED ORDER — GABAPENTIN 300 MG PO CAPS
300.0000 mg | ORAL_CAPSULE | Freq: Three times a day (TID) | ORAL | Status: DC
  Administered 2012-07-17 – 2012-07-18 (×3): 300 mg via ORAL
  Filled 2012-07-17 (×3): qty 1

## 2012-07-17 MED ORDER — NICOTINE 21 MG/24HR TD PT24
21.0000 mg | MEDICATED_PATCH | Freq: Every day | TRANSDERMAL | Status: DC
  Administered 2012-07-17 – 2012-07-18 (×2): 21 mg via TRANSDERMAL
  Filled 2012-07-17 (×2): qty 1

## 2012-07-17 MED ORDER — KETOROLAC TROMETHAMINE 60 MG/2ML IM SOLN
60.0000 mg | Freq: Once | INTRAMUSCULAR | Status: AC
  Administered 2012-07-17: 60 mg via INTRAMUSCULAR
  Filled 2012-07-17: qty 2

## 2012-07-17 MED ORDER — IBUPROFEN 400 MG PO TABS: 600.0000 mg | ORAL_TABLET | Freq: Three times a day (TID) | ORAL | Status: DC | PRN

## 2012-07-17 MED ORDER — CITALOPRAM HYDROBROMIDE 10 MG PO TABS
40.0000 mg | ORAL_TABLET | Freq: Every day | ORAL | Status: DC
  Administered 2012-07-17 – 2012-07-18 (×2): 40 mg via ORAL
  Filled 2012-07-17 (×2): qty 4

## 2012-07-17 MED ORDER — LORAZEPAM 1 MG PO TABS
1.0000 mg | ORAL_TABLET | Freq: Three times a day (TID) | ORAL | Status: DC | PRN
  Administered 2012-07-17 – 2012-07-18 (×2): 1 mg via ORAL
  Filled 2012-07-17: qty 1
  Filled 2012-07-17: qty 2
  Filled 2012-07-17: qty 1

## 2012-07-17 MED ORDER — PANTOPRAZOLE SODIUM 40 MG PO TBEC
40.0000 mg | DELAYED_RELEASE_TABLET | Freq: Every day | ORAL | Status: DC
  Administered 2012-07-17 – 2012-07-18 (×2): 40 mg via ORAL
  Filled 2012-07-17 (×2): qty 1
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
## 2012-07-17 NOTE — BHH Counselor (Signed)
Writer spoke with Tamela Oddi at [place] and was told that the pt had not been run yet. Denice Bors, AADC 07/17/2012 10:46 PM
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
## 2012-07-17 NOTE — BHH Counselor (Signed)
Wayland Denis, ACT counselor at [facility], submitted Pt for admission to [facility]. Shalita Forrest, [facility] confirmed there is currently not an appropriate bed available. Shuvon Rankin, NP reviewed clinical information and accepted Pt to the service of Dr. Cyndia Diver when an appropriate bed is available.  Harlin Rain Patsy Baltimore, LPC, [facility] Assessment Counselor

## 2012-07-18 ENCOUNTER — Inpatient Hospital Stay (HOSPITAL_COMMUNITY)
Admission: AD | Admit: 2012-07-18 | Discharge: 2012-07-25 | DRG: 885 | Disposition: A | Discharge status: Home / Self Care | Payer: No Typology Code available for payment source | Source: Ambulatory Visit | Attending: Psychiatry | Admitting: Psychiatry

## 2012-07-18 ENCOUNTER — Encounter (HOSPITAL_COMMUNITY): Payer: Self-pay

## 2012-07-18 DIAGNOSIS — I10 Essential (primary) hypertension: Secondary | ICD-10-CM

## 2012-07-18 DIAGNOSIS — Z5900 Homelessness unspecified: Secondary | ICD-10-CM

## 2012-07-18 DIAGNOSIS — Z0000 Encounter for general adult medical examination without abnormal findings: Secondary | ICD-10-CM

## 2012-07-18 DIAGNOSIS — R079 Chest pain, unspecified: Secondary | ICD-10-CM

## 2012-07-18 DIAGNOSIS — F419 Anxiety disorder, unspecified: Secondary | ICD-10-CM

## 2012-07-18 DIAGNOSIS — F29 Unspecified psychosis not due to a substance or known physiological condition: Secondary | ICD-10-CM

## 2012-07-18 DIAGNOSIS — Z720 Tobacco use: Secondary | ICD-10-CM

## 2012-07-18 DIAGNOSIS — F4310 Post-traumatic stress disorder, unspecified: Secondary | ICD-10-CM

## 2012-07-18 DIAGNOSIS — F329 Major depressive disorder, single episode, unspecified: Secondary | ICD-10-CM

## 2012-07-18 DIAGNOSIS — F603 Borderline personality disorder: Secondary | ICD-10-CM | POA: Diagnosis present

## 2012-07-18 DIAGNOSIS — I2510 Atherosclerotic heart disease of native coronary artery without angina pectoris: Secondary | ICD-10-CM

## 2012-07-18 DIAGNOSIS — G8929 Other chronic pain: Secondary | ICD-10-CM

## 2012-07-18 DIAGNOSIS — F3289 Other specified depressive episodes: Secondary | ICD-10-CM

## 2012-07-18 DIAGNOSIS — F319 Bipolar disorder, unspecified: Secondary | ICD-10-CM

## 2012-07-18 DIAGNOSIS — K529 Noninfective gastroenteritis and colitis, unspecified: Secondary | ICD-10-CM

## 2012-07-18 DIAGNOSIS — F1910 Other psychoactive substance abuse, uncomplicated: Secondary | ICD-10-CM

## 2012-07-18 DIAGNOSIS — M549 Dorsalgia, unspecified: Secondary | ICD-10-CM

## 2012-07-18 DIAGNOSIS — R45851 Suicidal ideations: Secondary | ICD-10-CM

## 2012-07-18 DIAGNOSIS — M47817 Spondylosis without myelopathy or radiculopathy, lumbosacral region: Secondary | ICD-10-CM

## 2012-07-18 DIAGNOSIS — J40 Bronchitis, not specified as acute or chronic: Secondary | ICD-10-CM

## 2012-07-18 DIAGNOSIS — F3110 Bipolar disorder, current episode manic without psychotic features, unspecified: Principal | ICD-10-CM

## 2012-07-18 DIAGNOSIS — N119 Chronic tubulo-interstitial nephritis, unspecified: Secondary | ICD-10-CM

## 2012-07-18 DIAGNOSIS — Z79899 Other long term (current) drug therapy: Secondary | ICD-10-CM

## 2012-07-18 DIAGNOSIS — E785 Hyperlipidemia, unspecified: Secondary | ICD-10-CM

## 2012-07-18 MED ORDER — SIMVASTATIN 20 MG PO TABS: 20.0000 mg | ORAL_TABLET | Freq: Every day | ORAL | Status: DC

## 2012-07-18 MED ORDER — ACETAMINOPHEN 325 MG PO TABS: 650.0000 mg | ORAL_TABLET | Freq: Four times a day (QID) | ORAL | Status: DC | PRN

## 2012-07-18 MED ORDER — NITROGLYCERIN 0.4 MG SL SUBL
0.4000 mg | SUBLINGUAL_TABLET | SUBLINGUAL | Status: DC | PRN
  Administered 2012-07-24: 0.4 mg via SUBLINGUAL
  Filled 2012-07-18: qty 25

## 2012-07-18 MED ORDER — CLONAZEPAM 0.5 MG PO TABS
0.5000 mg | ORAL_TABLET | Freq: Two times a day (BID) | ORAL | Status: DC | PRN
  Administered 2012-07-18 – 2012-07-25 (×14): 0.5 mg via ORAL
  Filled 2012-07-18 (×15): qty 1

## 2012-07-18 MED ORDER — HYDROCODONE-ACETAMINOPHEN 5-325 MG PO TABS
1.0000 | ORAL_TABLET | Freq: Four times a day (QID) | ORAL | Status: DC | PRN
  Administered 2012-07-18 – 2012-07-19 (×2): 1 via ORAL
  Filled 2012-07-18 (×2): qty 1

## 2012-07-18 MED ORDER — GABAPENTIN 300 MG PO CAPS
300.0000 mg | ORAL_CAPSULE | Freq: Three times a day (TID) | ORAL | Status: DC
  Administered 2012-07-18 – 2012-07-20 (×6): 300 mg via ORAL
  Filled 2012-07-18 (×12): qty 1

## 2012-07-18 MED ORDER — AMLODIPINE BESYLATE 5 MG PO TABS
5.0000 mg | ORAL_TABLET | Freq: Every day | ORAL | Status: DC
  Administered 2012-07-19: 5 mg via ORAL
  Filled 2012-07-18 (×3): qty 1

## 2012-07-18 MED ORDER — CLONAZEPAM 0.5 MG PO TABS: 0.5000 mg | ORAL_TABLET | Freq: Two times a day (BID) | ORAL | Status: DC

## 2012-07-18 MED ORDER — ALUM & MAG HYDROXIDE-SIMETH 200-200-20 MG/5ML PO SUSP: 30.0000 mL | ORAL | Status: DC | PRN

## 2012-07-18 MED ORDER — CITALOPRAM HYDROBROMIDE 40 MG PO TABS
40.0000 mg | ORAL_TABLET | Freq: Every day | ORAL | Status: DC
  Administered 2012-07-19: 40 mg via ORAL
  Filled 2012-07-18 (×3): qty 1

## 2012-07-18 MED ORDER — MELOXICAM 15 MG PO TABS
15.0000 mg | ORAL_TABLET | Freq: Every day | ORAL | Status: DC
  Administered 2012-07-19 – 2012-07-25 (×7): 15 mg via ORAL
  Filled 2012-07-18 (×9): qty 1
  Filled 2012-07-18: qty 2

## 2012-07-18 MED ORDER — MAGNESIUM HYDROXIDE 400 MG/5ML PO SUSP: 30.0000 mL | Freq: Every day | ORAL | Status: DC | PRN

## 2012-07-18 MED ORDER — ASPIRIN 81 MG PO CHEW
81.0000 mg | CHEWABLE_TABLET | Freq: Every day | ORAL | Status: DC
  Administered 2012-07-19 – 2012-07-25 (×7): 81 mg via ORAL
  Filled 2012-07-18 (×10): qty 1

## 2012-07-18 MED ORDER — HYDROCHLOROTHIAZIDE 25 MG PO TABS
25.0000 mg | ORAL_TABLET | Freq: Every day | ORAL | Status: DC
  Administered 2012-07-19 – 2012-07-25 (×7): 25 mg via ORAL
  Filled 2012-07-18 (×10): qty 1

## 2012-07-18 MED ORDER — AMITRIPTYLINE HCL 25 MG PO TABS
25.0000 mg | ORAL_TABLET | Freq: Every day | ORAL | Status: DC
  Administered 2012-07-18 – 2012-07-20 (×3): 25 mg via ORAL
  Filled 2012-07-18 (×6): qty 1

## 2012-07-18 MED ORDER — HYDROCODONE-ACETAMINOPHEN 5-325 MG PO TABS
1.0000 | ORAL_TABLET | Freq: Four times a day (QID) | ORAL | Status: DC | PRN
  Administered 2012-07-18: 1 via ORAL
  Filled 2012-07-18: qty 1

## 2012-07-18 MED ORDER — PANTOPRAZOLE SODIUM 40 MG PO TBEC
80.0000 mg | DELAYED_RELEASE_TABLET | Freq: Every day | ORAL | Status: DC
  Administered 2012-07-19 – 2012-07-25 (×7): 80 mg via ORAL
  Filled 2012-07-18 (×10): qty 2

## 2012-07-18 MED ORDER — SIMVASTATIN 40 MG PO TABS
40.0000 mg | ORAL_TABLET | Freq: Every day | ORAL | Status: DC
  Filled 2012-07-18: qty 1

## 2012-07-18 MED ORDER — LISINOPRIL 2.5 MG PO TABS
2.5000 mg | ORAL_TABLET | Freq: Every day | ORAL | Status: DC
  Administered 2012-07-19: 2.5 mg via ORAL
  Filled 2012-07-18 (×3): qty 1

## 2012-07-18 NOTE — Progress Notes (Signed)
Patient ID: Brenda [name], female   DOB: [birth year], 52 y.o.   MRN: [id number] D: Pt. Is lying in bed reports pain decreased with recent meds. Pt. Reports pain comes from arthritis and sciatic nerve pain.  "It's been a full day, coming from [facility] to here and adjusting, adjusting just wanting to relax."  Pt. Reports" depressed and overwhelmed" "I been depressed all my life, since I was 52 years old." "I just dealt with things, go around things, try to block it out, but this last 4-5 months have been erased." "If there was a perfect suicide it would be to go to sleep and never wake up again" "I don't believe in suicide, it's against my religion and I wouldn't try to do nothing to hurt myself because I'd be the one who ended up living and in a wheel chair." "I pray that the Shaune Pollack would just take me." A: Writer introduced self to client and provided emotional support by listening and encouraging rest. Pt,. Will be monitored q47min for safety. Pt. Encouraged to attend group. R: Pt. Is safe on the unit. Pt. Attended and participated in group.  Pt. Contracts for safety.

## 2012-07-18 NOTE — ED Provider Notes (Signed)
Patient has been accepted at [facility] by Dr. Miguel Dibble.   Brenda Booze, MD 07/18/12 [phone]

## 2012-07-18 NOTE — Tx Team (Signed)
Initial Interdisciplinary Treatment Plan  PATIENT STRENGTHS: (choose at least two) Ability for insight General fund of knowledge Motivation for treatment/growth  PATIENT STRESSORS: Financial difficulties Health problems Traumatic event   PROBLEM LIST: Problem List/Patient Goals Date to be addressed Date deferred Reason deferred Estimated date of resolution  Suicidal ideation      Paranoia      Racing thoughts                                           DISCHARGE CRITERIA:  Ability to meet basic life and health needs Adequate post-discharge living arrangements Improved stabilization in mood, thinking, and/or behavior Medical problems require only outpatient monitoring Motivation to continue treatment in a less acute level of care Need for constant or close observation no longer present Reduction of life-threatening or endangering symptoms to within safe limits Safe-care adequate arrangements made Verbal commitment to aftercare and medication compliance  PRELIMINARY DISCHARGE PLAN: Placement in alternative living arrangements  PATIENT/FAMIILY INVOLVEMENT: This treatment plan has been presented to and reviewed with the patient, Brenda [name].  The patient and family have been given the opportunity to ask questions and make suggestions.  Brenda [name] 07/18/2012, 2:00 PM

## 2012-07-18 NOTE — Progress Notes (Signed)
The focus of this group is to help patients review their daily goal of treatment and discuss progress on daily workbooks. Pt attended the evening group session and responded to all discussion questions from the Writer. Pt stated that she felt as though she "was able to take a little bit of control over things today that ordinarily controlled her, which made me feel good." Pt stated she was happy to be here receiving treatment. Pt smiled throughout group, offered constructive comments to her peers and appeared engaged.
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
## 2012-07-18 NOTE — ED Provider Notes (Signed)
Medical screening examination/treatment/procedure(s) were performed by non-physician practitioner and as supervising physician I was immediately available for consultation/collaboration.    Vida Roller, MD 07/18/12 [phone]

## 2012-07-19 ENCOUNTER — Encounter (HOSPITAL_COMMUNITY): Payer: Self-pay | Admitting: Psychiatry

## 2012-07-19 DIAGNOSIS — F411 Generalized anxiety disorder: Secondary | ICD-10-CM

## 2012-07-19 DIAGNOSIS — F315 Bipolar disorder, current episode depressed, severe, with psychotic features: Secondary | ICD-10-CM

## 2012-07-19 DIAGNOSIS — F29 Unspecified psychosis not due to a substance or known physiological condition: Secondary | ICD-10-CM

## 2012-07-19 DIAGNOSIS — F319 Bipolar disorder, unspecified: Secondary | ICD-10-CM

## 2012-07-19 DIAGNOSIS — F4310 Post-traumatic stress disorder, unspecified: Secondary | ICD-10-CM

## 2012-07-19 MED ORDER — CITALOPRAM HYDROBROMIDE 20 MG PO TABS
20.0000 mg | ORAL_TABLET | Freq: Every day | ORAL | Status: DC
  Administered 2012-07-20 – 2012-07-21 (×2): 20 mg via ORAL
  Filled 2012-07-19 (×4): qty 1

## 2012-07-19 MED ORDER — TRAMADOL HCL 50 MG PO TABS
50.0000 mg | ORAL_TABLET | Freq: Four times a day (QID) | ORAL | Status: DC | PRN
  Administered 2012-07-19 – 2012-07-25 (×15): 50 mg via ORAL
  Filled 2012-07-19 (×14): qty 1

## 2012-07-19 MED ORDER — TRAMADOL HCL 50 MG PO TABS: 50.0000 mg | ORAL_TABLET | Freq: Four times a day (QID) | ORAL | Status: DC

## 2012-07-19 MED ORDER — LISINOPRIL 5 MG PO TABS
5.0000 mg | ORAL_TABLET | Freq: Every day | ORAL | Status: DC
  Administered 2012-07-20 – 2012-07-25 (×6): 5 mg via ORAL
  Filled 2012-07-19 (×7): qty 1

## 2012-07-19 MED ORDER — ATORVASTATIN CALCIUM 20 MG PO TABS
20.0000 mg | ORAL_TABLET | Freq: Every day | ORAL | Status: DC
  Administered 2012-07-19 – 2012-07-23 (×5): 20 mg via ORAL
  Filled 2012-07-19 (×7): qty 1

## 2012-07-19 MED ORDER — DIVALPROEX SODIUM 500 MG PO DR TAB
500.0000 mg | DELAYED_RELEASE_TABLET | Freq: Two times a day (BID) | ORAL | Status: DC
  Administered 2012-07-19 – 2012-07-25 (×12): 500 mg via ORAL
  Filled 2012-07-19: qty 1
  Filled 2012-07-19: qty 28
  Filled 2012-07-19 (×5): qty 1
  Filled 2012-07-19: qty 28
  Filled 2012-07-19 (×5): qty 1
  Filled 2012-07-19: qty 28
  Filled 2012-07-19: qty 1
  Filled 2012-07-19: qty 28

## 2012-07-19 MED ORDER — AMLODIPINE BESYLATE 10 MG PO TABS
10.0000 mg | ORAL_TABLET | Freq: Every day | ORAL | Status: DC
  Administered 2012-07-20 – 2012-07-25 (×6): 10 mg via ORAL
  Filled 2012-07-19 (×3): qty 1
  Filled 2012-07-19: qty 2
  Filled 2012-07-19 (×4): qty 1

## 2012-07-19 MED ORDER — SERTRALINE HCL 50 MG PO TABS
50.0000 mg | ORAL_TABLET | Freq: Every day | ORAL | Status: DC
  Administered 2012-07-20 – 2012-07-25 (×6): 50 mg via ORAL
  Filled 2012-07-19 (×5): qty 1
  Filled 2012-07-19 (×2): qty 14
  Filled 2012-07-19: qty 1
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
## 2012-07-19 NOTE — B[facility] Group Notes (Signed)
[facility] LCSW Aftercare Discharge Planning Group Note   07/19/2012 9:46 AM  Participation Quality:  engaged  Mood/Affect:  Appropriate  Depression Rating:    Anxiety Rating:    Thoughts of Suicide:  Yes Will you contract for safety?   Yes  Current AVH:  No  Plan for Discharge/Comments:  Set boundaries for pt-no referral to rehab-she will [facility] into [facility], I will make referral to [facility]  Transportation Means: public transportation  Supports: Formal only  [place], [place] Distance B
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
## 2012-07-19 NOTE — BHH Suicide Risk Assessment (Signed)
Suicide Risk Assessment  Admission Assessment     Nursing information obtained from:    Demographic factors:    Current Mental Status:    Loss Factors:    Historical Factors:    Risk Reduction Factors:     CLINICAL FACTORS:   Severe Anxiety and/or Agitation Bipolar Disorder:   Depressive phase Depression:   Anhedonia Delusional Hopelessness Impulsivity Insomnia Alcohol/Substance Abuse/Dependencies Currently Psychotic  COGNITIVE FEATURES THAT CONTRIBUTE TO RISK:  Closed-mindedness Polarized thinking    SUICIDE RISK:   Moderate:  Frequent suicidal ideation with limited intensity, and duration, some specificity in terms of plans, no associated intent, good self-control, limited dysphoria/symptomatology, some risk factors present, and identifiable protective factors, including available and accessible social support.  PLAN OF CARE:1. Admit for crisis management and stabilization. 2. Medication management to reduce current symptoms to base line and improve the patient's overall level of functioning 3. Treat health problems as indicated. 4. Develop treatment plan to decrease risk of relapse upon discharge and the need for readmission. 5. Psycho-social education regarding relapse prevention and self care. 6. Health care follow up as needed for medical problems. 7. Restart home medications where appropriate.   I certify that inpatient services furnished can reasonably be expected to improve the patient's condition.  Ken Bonn,MD 07/19/2012, 11:01 AM

## 2012-07-19 NOTE — Progress Notes (Signed)
The focus of this group is to help patients review their daily goal of treatment and discuss progress on daily workbooks. Pt attended the evening group and responded to all discussion prompts from the Writer. Pt stated that today was good because "I was finally given a diagnosis by my doctor and some paperwork to read on it." Pt stated this was a huge relief. Pt shared that she was still figuring out what her short and long term goals were but that receiving her diagnosis was a "big step toward figuring them out." Pt smiled throughout group and appeared engaged.

## 2012-07-20 DIAGNOSIS — F4310 Post-traumatic stress disorder, unspecified: Secondary | ICD-10-CM

## 2012-07-20 DIAGNOSIS — F319 Bipolar disorder, unspecified: Secondary | ICD-10-CM

## 2012-07-20 MED ORDER — KETOROLAC TROMETHAMINE 30 MG/ML IJ SOLN
30.0000 mg | Freq: Once | INTRAMUSCULAR | Status: AC
  Administered 2012-07-20: 30 mg via INTRAMUSCULAR
  Filled 2012-07-20 (×2): qty 1

## 2012-07-20 MED ORDER — GABAPENTIN 400 MG PO CAPS
400.0000 mg | ORAL_CAPSULE | Freq: Three times a day (TID) | ORAL | Status: DC
  Administered 2012-07-20 – 2012-07-25 (×15): 400 mg via ORAL
  Filled 2012-07-20 (×7): qty 1
  Filled 2012-07-20: qty 42
  Filled 2012-07-20 (×2): qty 1
  Filled 2012-07-20 (×3): qty 42
  Filled 2012-07-20 (×4): qty 1
  Filled 2012-07-20 (×2): qty 42
  Filled 2012-07-20: qty 1
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
## 2012-07-20 NOTE — Progress Notes (Signed)
D: Patient in bed on approach.  Patient states she has been tired today.  Patient isolative and has been in bed tonight.  Patient states she had been in pain all day. Patient denies SI/HI and denies AVH.   A: Staff to monitor Q 15 mins for safety.  Encouragement and support offered.  Scheduled medication administered per orders.  Tramadol administered prn for back pain.    R: Patient remains safe on the unit.  Patient did not attend group tonight.  Patient calm, cooperative and taking administered medications.  Patient has not come out of her room tonight.
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
## 2012-07-20 NOTE — [facility] Group Notes (Signed)
[facility] LCSW Group Therapy  02/26/2012 12:00 PM  Type of Therapy:  Group Therapy  Participation Level:  Minimal  Participation Quality:  Drowsy  Affect:  Appropriate  Cognitive:  Appropriate  Insight:  Improving  Engagement in Group:  Limited  Engagement in Therapy:  Limited  Modes of Intervention:  Discussion, Education, Socialization and Support  Summary of Progress/Problems: MHA: Patient was drowsy but attempted to keep eyes open during MHA speaker's presentation and discussion about his personal experience with mental illness and review of services offered by [facility]. She left group shortly after speaker began to play guitar and did not return.  Smart, Heather N 02/26/2012, 12:00 PM

## 2012-07-21 MED ORDER — MIRTAZAPINE 15 MG PO TBDP
15.0000 mg | ORAL_TABLET | Freq: Every day | ORAL | Status: DC
  Administered 2012-07-21 – 2012-07-22 (×2): 15 mg via ORAL
  Filled 2012-07-21 (×3): qty 1
  Filled 2012-07-21: qty 14

## 2012-07-21 MED ORDER — KETOROLAC TROMETHAMINE 30 MG/ML IJ SOLN
30.0000 mg | Freq: Once | INTRAMUSCULAR | Status: AC
  Administered 2012-07-21: 30 mg via INTRAMUSCULAR
  Filled 2012-07-21 (×2): qty 1

## 2012-07-21 MED ORDER — NICOTINE 21 MG/24HR TD PT24
21.0000 mg | MEDICATED_PATCH | Freq: Every day | TRANSDERMAL | Status: DC
  Administered 2012-07-21 – 2012-07-25 (×5): 21 mg via TRANSDERMAL
  Filled 2012-07-21 (×8): qty 1

## 2012-07-21 NOTE — Progress Notes (Signed)
Recreation Therapy Notes  Date: 04.10.2014 Time: 3:00pm Location: [facility] [place]      Group Topic/Focus: Teamwork, Communication, Problem Solving  Participation Level: Active  Participation Quality: Appropriate  Affect: Euthymic  Cognitive: Appropriate   Additional Comments: Activity: Drop the Ball; Explaination: Patient were broken up into 2 teams. Patients were given 10 drinking straws, a length of masking tape and asked to create a landing pad that would catch a ping pong ball from appropriately 5 feet in the air. Patients were given 20 minutes to work together to create the landing pad.   Patient stated she let her teammates take the lead on building their landing pad. Patient team unsuccessful at building a landing pad to catch a ping pong ball. Patient listening to wrap up discussion about using communications skills and problem solving skills to creat a support system post d/c.   Brenda [name], LRT/CTRS   Jearl Klinefelter 07/21/2012 4:44 PM

## 2012-07-21 NOTE — Progress Notes (Addendum)
D:  Patient's self inventory sheet, patient sleeps well, has good appetite, low energy level, improving attention span.  Rated depression #5, hopelessness #6.  Denied withdrawals.  Has experienced pain, goal #3, worst pain #7.  After discharge, plans to "get therapy, see doctors, all doctors, follow plan, will, keep journal.  I still working with rod on living arrangements.:"  No discharge plans.  Needs financial assistance for medications.  Needs several days worth of new meds until she get a letter about insurance.  Denied SI and HI.   Denied A/V hallucinations. A"  Medications given per MD orders.  Support and encouragement given throughout day. R:  Following treatment plan.  Denied SI and HI.  Denied A/V hallucinations.  Contracts for safety. Patient continually asking for medications.   Already asking about her injection for pain if she needs it later in this day.

## 2012-07-21 NOTE — Progress Notes (Signed)
Patient did not attend the evening karaoke group. Pt was notified that group was beginning and remained in bed.   

## 2012-07-21 NOTE — Progress Notes (Signed)
D: Patient in bed reading on approach.  Patient states she has been reading up on all of her medications.  Patient states she cannot voice what she has learned because she has learned so much.  Patient is preoccupied with her medications.  Patient asked for klonopin and states she knows she cannot have it yet but feels like she soul be able to have it three times a day.    Patient states there is always a lot of talking in the groups.  Patient denies SI/HI but states she does hear voices.  Patient states the voices are not saying anything specific but patient states it always sounds like there is someone else in her room talking.   A: Staff to monitor Q 15 mins for safety.  Encouragement and support offered.  Scheduled medications administered per orders.  Klonopin administered prn or anxiety. R: Patient remains safe on the unit.  Patient did not attend karaoke tonight.  Patient hs been isolating and staying in her room.  Patient taking administered medications.

## 2012-07-21 NOTE — Clinical Social Work Note (Signed)
[facility] Group Notes:  (Counselor/Nursing/MHT/Case Management/Adjunct)  02/25/2012 2:20 PM  Type of Therapy:  Group Therapy  Participation Level:  Active  Participation Quality:  Monopolizing, Redirectable  Affect:  Anxious  Cognitive:  Oriented  Insight:  Improving  Engagement in Group:  Engaged  Engagement in Therapy:  Improving  Modes of Intervention:  Discussion, Exploration and Socialization  Summary of Progress/Problems: .balance: The topic for group was balance in life.  Pt participated in the discussion about when their life was in balance and out of balance and how this feels.  Pt discussed ways to get back in balance and short term goals they can work on to get where they want to be. Brenda [name] gave multiple examples of imbalance in her dramatic way.  She was also able to identify both running away and asking for help as gaining balance.  She ultimately wants to give back to those less fortunate than her.   Brenda [name] B 02/25/2012, 2:20 PM

## 2012-07-21 NOTE — Progress Notes (Signed)
[facility] MD Progress Note  07/21/2012 3:10 PM Brenda [name]  MRN:  [id number] Subjective:  Brenda [name] endorses that the Toradol shot did help her. She would like to have another one. She is still dealing with all the worries and concerns, her anxiety and mood instability. She is not sleeping well at night. Still worried as far as where to go from here. Diagnosis:  Bipolar Disorder, PTSD  ADL's:  Intact  Sleep: Poor  Appetite:  Fair  Suicidal Ideation:  Plan:  denies Intent:  denies Means:  denies Homicidal Ideation:  Plan:  denies Intent:  denies Means:  denies AEB (as evidenced by):  Psychiatric Specialty Exam: Review of Systems  Constitutional: Negative.   HENT: Negative.   Eyes: Negative.   Respiratory: Negative.   Cardiovascular: Negative.   Gastrointestinal: Negative.   Genitourinary: Negative.   Musculoskeletal: Positive for back pain.  Skin: Negative.   Neurological: Negative.   Endo/Heme/Allergies: Negative.   Psychiatric/Behavioral: Positive for substance abuse. The patient is nervous/anxious.     Blood pressure 116/76, pulse 79, temperature 98.3 F (36.8 C), temperature source Oral, resp. rate 18, height 5\' 3"  (1.6 m), weight 59.421 kg (131 lb), SpO2 98.00%.Body mass index is 23.21 kg/(m^2).  General Appearance: Fairly Groomed  Patent attorney::  Fair  Speech:  Clear and Coherent and rapid  Volume:  fluctuates  Mood:  Anxious and worried, concerned  Affect:  anxious  Thought Process:  Coherent and Goal Directed  Orientation:  Full (Time, Place, and Person)  Thought Content:  Rumination and worries, concerns  Suicidal Thoughts:  No  Homicidal Thoughts:  No  Memory:  Immediate;   Fair Recent;   Fair Remote;   Fair  Judgement:  Fair  Insight:  superficial  Psychomotor Activity:  Restlessness  Concentration:  Fair  Recall:  Fair  Akathisia:  No  Handed:  Right  AIMS (if indicated):     Assets:  Desire for Improvement  Sleep:  Number of Hours: 6.75   Current  Medications: Current Facility-Administered Medications  Medication Dose Route Frequency Provider Last Rate Last Dose  . acetaminophen (TYLENOL) tablet 650 mg  650 mg Oral Q6H PRN Verne Spurr, PA-C      . alum & mag hydroxide-simeth (MAALOX/MYLANTA) 200-200-20 MG/5ML suspension 30 mL  30 mL Oral Q4H PRN Verne Spurr, PA-C      . amLODipine (NORVASC) tablet 10 mg  10 mg Oral Daily Mojeed Akintayo   10 mg at 07/21/12 0816  . aspirin chewable tablet 81 mg  81 mg Oral Daily Mojeed Akintayo   81 mg at 07/21/12 0816  . atorvastatin (LIPITOR) tablet 20 mg  20 mg Oral q1800 Lauren Bajbus, RPH   20 mg at 07/20/12 1717  . clonazePAM (KLONOPIN) tablet 0.5 mg  0.5 mg Oral BID PRN Verne Spurr, PA-C   0.5 mg at 07/21/12 4098  . divalproex (DEPAKOTE) DR tablet 500 mg  500 mg Oral BID PC Mojeed Akintayo   500 mg at 07/21/12 0817  . gabapentin (NEURONTIN) capsule 400 mg  400 mg Oral TID Rachael Fee, MD   400 mg at 07/21/12 1200  . hydrochlorothiazide (HYDRODIURIL) tablet 25 mg  25 mg Oral Daily Verne Spurr, PA-C   25 mg at 07/21/12 0818  . lisinopril (PRINIVIL,ZESTRIL) tablet 5 mg  5 mg Oral Daily Mojeed Akintayo   5 mg at 07/21/12 0818  . magnesium hydroxide (MILK OF MAGNESIA) suspension 30 mL  30 mL Oral Daily PRN Verne Spurr, PA-C      .  meloxicam (MOBIC) tablet 15 mg  15 mg Oral Daily Verne Spurr, PA-C   15 mg at 07/21/12 0818  . mirtazapine (REMERON SOL-TAB) disintegrating tablet 15 mg  15 mg Oral QHS Rachael Fee, MD      . nicotine (NICODERM CQ - dosed in mg/24 hours) patch 21 mg  21 mg Transdermal Daily Mojeed Akintayo   21 mg at 07/21/12 0929  . nitroGLYCERIN (NITROSTAT) SL tablet 0.4 mg  0.4 mg Sublingual Q5 min PRN Verne Spurr, PA-C      . pantoprazole (PROTONIX) EC tablet 80 mg  80 mg Oral Daily Verne Spurr, PA-C   80 mg at 07/21/12 0819  . sertraline (ZOLOFT) tablet 50 mg  50 mg Oral Daily Mojeed Akintayo   50 mg at 07/21/12 0820  . traMADol (ULTRAM) tablet 50 mg  50 mg Oral Q6H PRN  Verne Spurr, PA-C   50 mg at 07/21/12 4098    Lab Results: No results found for this or any previous visit (from the past 48 hour(s)).  Physical Findings: AIMS: Facial and Oral Movements Muscles of Facial Expression: None, normal Lips and Perioral Area: None, normal Jaw: None, normal Tongue: None, normal,Extremity Movements Upper (arms, wrists, hands, fingers): None, normal Lower (legs, knees, ankles, toes): None, normal, Trunk Movements Neck, shoulders, hips: None, normal, Overall Severity Severity of abnormal movements (highest score from questions above): None, normal Incapacitation due to abnormal movements: None, normal Patient's awareness of abnormal movements (rate only patient's report): No Awareness, Dental Status Current problems with teeth and/or dentures?: No Does patient usually wear dentures?: No  CIWA:  CIWA-Ar Total: 1 COWS:  COWS Total Score: 1  Treatment Plan Summary: Daily contact with patient to assess and evaluate symptoms and progress in treatment Medication management  Plan: Supportive approach/coping skills           Improve pain management           Remeron 15 mg for sleep  Medical Decision Making Problem Points:  Review of psycho-social stressors (1) Data Points:  Review of medication regiment & side effects (2) Review of new medications or change in dosage (2)  I certify that inpatient services furnished can reasonably be expected to improve the patient's condition.   [name] A 07/21/2012, 3:10 PM

## 2012-07-22 MED ORDER — LOPERAMIDE HCL 2 MG PO CAPS: 2.0000 mg | ORAL_CAPSULE | ORAL | Status: DC | PRN

## 2012-07-22 MED ORDER — CLONIDINE HCL 0.1 MG PO TABS
0.1000 mg | ORAL_TABLET | Freq: Every day | ORAL | Status: DC
  Administered 2012-07-22: 0.1 mg via ORAL
  Filled 2012-07-22: qty 1

## 2012-07-22 MED ORDER — ONDANSETRON 4 MG PO TBDP
4.0000 mg | ORAL_TABLET | Freq: Four times a day (QID) | ORAL | Status: DC | PRN
  Administered 2012-07-22 – 2012-07-24 (×5): 4 mg via ORAL
  Filled 2012-07-22: qty 1

## 2012-07-22 MED ORDER — HYDROXYZINE HCL 25 MG PO TABS
25.0000 mg | ORAL_TABLET | Freq: Four times a day (QID) | ORAL | Status: DC | PRN
  Administered 2012-07-22 – 2012-07-25 (×7): 25 mg via ORAL

## 2012-07-22 MED ORDER — CLONIDINE HCL 0.1 MG PO TABS
0.1000 mg | ORAL_TABLET | Freq: Four times a day (QID) | ORAL | Status: AC
  Administered 2012-07-22 – 2012-07-23 (×4): 0.1 mg via ORAL
  Filled 2012-07-22 (×6): qty 1

## 2012-07-22 MED ORDER — METHOCARBAMOL 500 MG PO TABS
500.0000 mg | ORAL_TABLET | Freq: Three times a day (TID) | ORAL | Status: DC | PRN
  Administered 2012-07-23 – 2012-07-25 (×5): 500 mg via ORAL
  Filled 2012-07-22 (×5): qty 1

## 2012-07-22 MED ORDER — CLONIDINE HCL 0.1 MG PO TABS
0.1000 mg | ORAL_TABLET | ORAL | Status: DC
  Administered 2012-07-24 – 2012-07-25 (×3): 0.1 mg via ORAL
  Filled 2012-07-22 (×5): qty 1

## 2012-07-22 MED ORDER — NAPROXEN 500 MG PO TABS
500.0000 mg | ORAL_TABLET | Freq: Two times a day (BID) | ORAL | Status: DC | PRN
  Administered 2012-07-23 – 2012-07-24 (×2): 500 mg via ORAL
  Filled 2012-07-22 (×2): qty 1

## 2012-07-22 MED ORDER — DICYCLOMINE HCL 20 MG PO TABS
20.0000 mg | ORAL_TABLET | Freq: Four times a day (QID) | ORAL | Status: DC | PRN
  Administered 2012-07-22 – 2012-07-24 (×3): 20 mg via ORAL
  Filled 2012-07-22 (×3): qty 1

## 2012-07-22 NOTE — Tx Team (Signed)
  Interdisciplinary Treatment Plan Update   Date Reviewed:  07/22/2012  Time Reviewed:  1:36 PM  Progress in Treatment:   Attending groups: Yes Participating in groups: Yes Taking medication as prescribed: Yes  Tolerating medication: Yes Family/Significant other contact made: No  Patient understands diagnosis: Yes  As evidenced by asking for help with depression and anxiety secondary to trauma  Discussing patient identified problems/goals with staff: Yes See initial plan Medical problems stabilized or resolved: Yes Denies suicidal/homicidal ideation:Yes  In tx team Patient has not harmed self or others: Yes  For review of initial/current patient goals, please see plan of care.  Estimated Length of Stay: 2-3 days  Reason for Continuation of Hospitalization: Medication stabilization Anxiety  New Problems/Goals identified:  N/A  Discharge Plan or Barriers:   D/C to shelter, follow up outpt  Additional Comments: Brenda [name] continues to complain of symptoms related to unstable mood, and wants on-going adjustments to her meds.  We have contacted the shelter 3 days running to secure a bed.  None available.   Attendees:  Signature: Geoffery Lyons, MD 07/22/2012 1:36 PM   Signature: Richelle Ito, LCSW 07/22/2012 1:36 PM  Signature: Robbie Louis, RN 07/22/2012 1:36 PM  Signature:  07/22/2012 1:36 PM  Signature: Liborio Nixon, RN 07/22/2012 1:36 PM  Signature:  07/22/2012 1:36 PM  Signature:   07/22/2012 1:36 PM  Signature:    Signature:    Signature:    Signature:    Signature:    Signature:      Scribe for Treatment Team:   Richelle Ito, LCSW  07/22/2012 1:36 PM

## 2012-07-22 NOTE — Progress Notes (Addendum)
Chaplain encountered pt in hallway and introduced spiritual care as resource.  Pt requested bible for herself and another pt.  Chaplain delivered bibles to nursing station.   Belva Crome MDiv

## 2012-07-22 NOTE — [facility] Group Notes (Cosign Needed)
[facility] LCSW Group Therapy  Relapse Prevention 1:15-2:15 07/22/2012    Type of Therapy: Group Therapy   Participation Level: Did Not Attend  Brenda [name] 07/22/2012   

## 2012-07-22 NOTE — [facility] Suicide Risk Assessment (Cosign Needed)
[facility] INPATIENT:  Family/Significant Other Suicide Prevention Education  Suicide Prevention Education:  Patient Refusal for Family/Significant Other Suicide Prevention Education: The patient Brenda [name] has refused to provide written consent for family/significant other to be provided Family/Significant Other Suicide Prevention Education during admission and/or prior to discharge.  Physician notified.  Patient provided SPE handout and education about suicide risks factors, warning signs, and services for crisis assistance. Patient encouraged to ask questions and given her psychiatrist and therapist information to write on back of handout for reference. SPE performed 07/22/12 at 3:30PM  [name], [name] 07/22/2012, 3:25 PM

## 2012-07-22 NOTE — Progress Notes (Addendum)
Pt reported that after lying in bed for 45 minutes she was able to sleep the rest of the night.  She rated her depression 6 hopelessness a 5 and her anxiety a 7 on her self-inventory.  She denies any symptoms of withdrawal. She denies any S/H ideation or A/V hallucinations thus far today.  She keeps talking about her klonopin and how much medications she takes at home.She stated,"I take twice the pills that you give me here" she went on to talk about how much hydrocodone she has been taking.  She then yelled out,"wait until that doctor makes you give me that shot right at 2". Pull pt to side and in private we discussed the fact that now she was on the hall where people were battling both depression anxiety an such but also that are trying to come off the very medications she keeps yelling out about. She did finally voice understanding but this nurse not quite sure if she really understood or not.    Pt started to c/o withdrawal from her "hydrocodone" discussed with NP and she placed pt on clondine protocol this afternoon. Pt was given prn medications (see mar, pain, and daily cares) Pt very happy thus far for her medication change.

## 2012-07-22 NOTE — [facility] Group Notes (Signed)
[facility] LCSW Aftercare Discharge Planning Group Note   07/22/2012 10:46 AM  Participation Quality:    Mood/Affect:  Appropriate  Depression Rating:  Not reported  Anxiety Rating:  Not reported  Thoughts of Suicide:  No Will you contract for safety?   NA  Current AVH:  No  Plan for Discharge/Comments:  Pt plans to go to women's shelter after discharge. Currently, no beds available. She will follow-up at [facility] for med management and has appt at [facility] for therapy on 4/22.   Transportation Means: bus  Supports: none  Brenda [name] reported that she is experiencing a "mixed episode" today. She stated that her mood is "all over the place and is confused." Brenda [name] appeared to be in pleasant mood throughout group. She was given information regarding the [facility] as requested, including phone number and address to research further services.  Smart, [name]

## 2012-07-22 NOTE — Progress Notes (Signed)
Adult Psychoeducational Group Note  Date:  07/22/2012 Time:  10:57 AM  Group Topic/Focus:  Relapse Prevention Planning:   The focus of this group is to define relapse and discuss the need for planning to combat relapse.  Participation Level:  Did Not Attend  Brenda [name] 07/22/2012, 10:57 AM

## 2012-07-22 NOTE — Progress Notes (Signed)
Patient ID: EMA HEBNER, female   DOB: [birth year], 52 y.o.   MRN: [id number] [facility] MD Progress Note  07/22/2012 1:42 PM Brenda [name]  MRN:  [id number]  Subjective:  Brenda [name] endorses that she is in too much pain to her legs and back areas. Requested a shot of Toradol. States that she is not here for substance abuse issues and or detoxification treatment. Rather, she is here for help, to get diagnosis of her mental illness so that she can get the help that she needs. She says that she is having mixed episodes, hyperactivity, mood swings and crying spells. She threatened that if she cannot get narcotic pain medication reinstated, she will need to be discharged. She adds that if she walks out of here today, she will find a doctor that will prescribe her narcotic pills. She say that she wants out of this place if her pain pills cannot be reinstated".  Diagnosis:  Bipolar Disorder, PTSD  ADL's:  Intact  Sleep: Poor  Appetite:  Fair  Suicidal Ideation:  Plan:  denies Intent:  denies Means:  denies Homicidal Ideation:  Plan:  denies Intent:  denies Means:  denies AEB (as evidenced by):  Psychiatric Specialty Exam: Review of Systems  Constitutional: Negative.   HENT: Negative.   Eyes: Negative.   Respiratory: Negative.   Cardiovascular: Negative.   Gastrointestinal: Negative.   Genitourinary: Negative.   Musculoskeletal: Positive for back pain.  Skin: Negative.   Neurological: Negative.   Endo/Heme/Allergies: Negative.   Psychiatric/Behavioral: Positive for substance abuse. The patient is nervous/anxious.     Blood pressure 125/86, pulse 97, temperature 97 F (36.1 C), temperature source Oral, resp. rate 20, height 5\' 3"  (1.6 m), weight 59.421 kg (131 lb), SpO2 98.00%.Body mass index is 23.21 kg/(m^2).  General Appearance: Fairly Groomed  Patent attorney::  Fair  Speech:  Clear and Coherent and rapid  Volume:  fluctuates  Mood:  Anxious and worried, concerned  Affect:  anxious   Thought Process:  Coherent and Goal Directed  Orientation:  Full (Time, Place, and Person)  Thought Content:  Rumination and worries, concerns  Suicidal Thoughts:  No  Homicidal Thoughts:  No  Memory:  Immediate;   Fair Recent;   Fair Remote;   Fair  Judgement:  Fair  Insight:  superficial  Psychomotor Activity:  Restlessness  Concentration:  Fair  Recall:  Fair  Akathisia:  No  Handed:  Right  AIMS (if indicated):     Assets:  Desire for Improvement  Sleep:  Number of Hours: 6   Current Medications: Current Facility-Administered Medications  Medication Dose Route Frequency Provider Last Rate Last Dose  . acetaminophen (TYLENOL) tablet 650 mg  650 mg Oral Q6H PRN Verne Spurr, PA-C      . alum & mag hydroxide-simeth (MAALOX/MYLANTA) 200-200-20 MG/5ML suspension 30 mL  30 mL Oral Q4H PRN Verne Spurr, PA-C      . amLODipine (NORVASC) tablet 10 mg  10 mg Oral Daily Mojeed Akintayo   10 mg at 07/22/12 0758  . aspirin chewable tablet 81 mg  81 mg Oral Daily Mojeed Akintayo   81 mg at 07/22/12 0759  . atorvastatin (LIPITOR) tablet 20 mg  20 mg Oral q1800 Lauren Bajbus, RPH   20 mg at 07/21/12 1707  . clonazePAM (KLONOPIN) tablet 0.5 mg  0.5 mg Oral BID PRN Verne Spurr, PA-C   0.5 mg at 07/22/12 0928  . divalproex (DEPAKOTE) DR tablet 500 mg  500 mg Oral BID PC  Mojeed Akintayo   500 mg at 07/22/12 0800  . gabapentin (NEURONTIN) capsule 400 mg  400 mg Oral TID Rachael Fee, MD   400 mg at 07/22/12 1156  . hydrochlorothiazide (HYDRODIURIL) tablet 25 mg  25 mg Oral Daily Verne Spurr, PA-C   25 mg at 07/22/12 0759  . lisinopril (PRINIVIL,ZESTRIL) tablet 5 mg  5 mg Oral Daily Mojeed Akintayo   5 mg at 07/22/12 0759  . magnesium hydroxide (MILK OF MAGNESIA) suspension 30 mL  30 mL Oral Daily PRN Verne Spurr, PA-C      . meloxicam (MOBIC) tablet 15 mg  15 mg Oral Daily Verne Spurr, PA-C   15 mg at 07/22/12 0758  . mirtazapine (REMERON SOL-TAB) disintegrating tablet 15 mg  15 mg Oral  QHS Rachael Fee, MD   15 mg at 07/21/12 2134  . nicotine (NICODERM CQ - dosed in mg/24 hours) patch 21 mg  21 mg Transdermal Daily Mojeed Akintayo   21 mg at 07/22/12 0757  . nitroGLYCERIN (NITROSTAT) SL tablet 0.4 mg  0.4 mg Sublingual Q5 min PRN Verne Spurr, PA-C      . pantoprazole (PROTONIX) EC tablet 80 mg  80 mg Oral Daily Verne Spurr, PA-C   80 mg at 07/22/12 0758  . sertraline (ZOLOFT) tablet 50 mg  50 mg Oral Daily Mojeed Akintayo   50 mg at 07/22/12 0758  . traMADol (ULTRAM) tablet 50 mg  50 mg Oral Q6H PRN Verne Spurr, PA-C   50 mg at 07/22/12 0805    Lab Results: No results found for this or any previous visit (from the past 48 hour(s)).  Physical Findings: AIMS: Facial and Oral Movements Muscles of Facial Expression: None, normal Lips and Perioral Area: None, normal Jaw: None, normal Tongue: None, normal,Extremity Movements Upper (arms, wrists, hands, fingers): None, normal Lower (legs, knees, ankles, toes): None, normal, Trunk Movements Neck, shoulders, hips: None, normal, Overall Severity Severity of abnormal movements (highest score from questions above): None, normal Incapacitation due to abnormal movements: None, normal Patient's awareness of abnormal movements (rate only patient's report): No Awareness, Dental Status Current problems with teeth and/or dentures?: No Does patient usually wear dentures?: No  CIWA:  CIWA-Ar Total: 1 COWS:  COWS Total Score: 1  Treatment Plan Summary: Daily contact with patient to assess and evaluate symptoms and progress in treatment Medication management  Plan: Supportive approach/coping skills/relapse prevention. Encouraged out of room, participation in group sessions and application of coping skills when distressed. Will continue to monitor response to/adverse effects of medications in use to assure effectiveness. Continue to monitor mood, behavior and interaction with staff and other patients. Continue current plan of  care.   Medical Decision Making Problem Points:  Review of psycho-social stressors (1) Data Points:  Review of medication regiment & side effects (2) Review of new medications or change in dosage (2)  I certify that inpatient services furnished can reasonably be expected to improve the patient's condition.   Armandina Stammer I 07/22/2012, 1:42 PM

## 2012-07-23 DIAGNOSIS — F1910 Other psychoactive substance abuse, uncomplicated: Secondary | ICD-10-CM

## 2012-07-23 MED ORDER — MIRTAZAPINE 15 MG PO TBDP: 15.0000 mg | ORAL_TABLET | Freq: Every day | ORAL | Status: DC

## 2012-07-23 MED ORDER — OMEPRAZOLE 40 MG PO CPDR: 40.0000 mg | DELAYED_RELEASE_CAPSULE | Freq: Every day | ORAL | Status: DC

## 2012-07-23 MED ORDER — SERTRALINE HCL 50 MG PO TABS: 50.0000 mg | ORAL_TABLET | Freq: Every day | ORAL | Status: DC

## 2012-07-23 MED ORDER — GABAPENTIN 400 MG PO CAPS: 400.0000 mg | ORAL_CAPSULE | Freq: Three times a day (TID) | ORAL | Status: DC

## 2012-07-23 MED ORDER — AMLODIPINE BESYLATE 10 MG PO TABS: 10.0000 mg | ORAL_TABLET | Freq: Every day | ORAL | Status: DC

## 2012-07-23 MED ORDER — SIMVASTATIN 40 MG PO TABS: 40.0000 mg | ORAL_TABLET | Freq: Every evening | ORAL | Status: DC

## 2012-07-23 MED ORDER — HYDROCHLOROTHIAZIDE 25 MG PO TABS: 25.0000 mg | ORAL_TABLET | Freq: Every day | ORAL | Status: DC

## 2012-07-23 MED ORDER — ASPIRIN EC 81 MG PO TBEC: 81.0000 mg | DELAYED_RELEASE_TABLET | Freq: Every day | ORAL | Status: DC

## 2012-07-23 MED ORDER — DIVALPROEX SODIUM 500 MG PO DR TAB: 500.0000 mg | DELAYED_RELEASE_TABLET | Freq: Two times a day (BID) | ORAL | Status: DC

## 2012-07-23 MED ORDER — LISINOPRIL 5 MG PO TABS: 5.0000 mg | ORAL_TABLET | Freq: Every day | ORAL | Status: DC

## 2012-07-23 MED ORDER — DICYCLOMINE HCL 20 MG PO TABS: 20.0000 mg | ORAL_TABLET | Freq: Three times a day (TID) | ORAL | Status: DC

## 2012-07-23 MED ORDER — CLONAZEPAM 0.5 MG PO TABS: 0.5000 mg | ORAL_TABLET | Freq: Two times a day (BID) | ORAL | Status: DC | PRN

## 2012-07-23 MED ORDER — LIDOCAINE 5 % EX PTCH
1.0000 | MEDICATED_PATCH | CUTANEOUS | Status: DC
  Administered 2012-07-23 – 2012-07-24 (×2): 1 via TRANSDERMAL
  Filled 2012-07-23 (×4): qty 1

## 2012-07-23 MED ORDER — MIRTAZAPINE 30 MG PO TBDP
30.0000 mg | ORAL_TABLET | Freq: Every day | ORAL | Status: DC
  Administered 2012-07-23 – 2012-07-24 (×2): 30 mg via ORAL
  Filled 2012-07-23 (×4): qty 1

## 2012-07-23 MED ORDER — MELOXICAM 15 MG PO TABS: 15.0000 mg | ORAL_TABLET | Freq: Every day | ORAL | Status: DC

## 2012-07-23 NOTE — Progress Notes (Signed)
Adult Psychoeducational Group Note  Date:  07/23/2012 Time:  0900  Group Topic/Focus:  Self inventory sheet  Participation Level:  Did Not Attend            Additional Comments:  Doing groups on [address] hall  Roselee Culver 07/23/2012, 12:10 PM

## 2012-07-23 NOTE — Progress Notes (Addendum)
D patient states she slept poorly even w/medications last nite, her appetite is improving and she goes to the Dr for meals, her energy level is mixed sometimes high and other times low depending on the situation, ability to pay attention is poor, her depression is 7/10 and hopeless is 5/10 today, she continues w/WD s/s of diarrhea, agitation, chills and cramping, she denies Si or Hi and no AVH, she has not attended group today on the 500 hall b/c she has felt bad, aching and nauseated. A q31min safety checks continue and support offered her 1:1, she has been encouraged to go to her groups on the [address] hall, meds given as ordered by MD, prns given for pain and nausea. R patient remains safe on the unit

## 2012-07-23 NOTE — Progress Notes (Signed)
Adult Psychoeducational Group Note  Date:  07/23/2012 Time:  6:44 PM  Group Topic/Focus:  Therapeutic Activity  Participation Level:  Did Not Attend  Dalia Heading 07/23/2012, 6:44 PM

## 2012-07-23 NOTE — [facility] Group Notes (Addendum)
[facility] LCSW Group Therapy  07/23/2012 10:00-11:00AM  Summary of Progress/Problems:  In group, the patients discussed ways in which they have sabotaged their own recovery.  Motivational Interviewing was utilized to ask group members what "benefits" they are looking for when they use substances, and what they want to change.  There was an excellent discussion about taking personal responsibility for all one's care of all aspects of the dual diagnosis they face, and for having knowledge of illnesses, symptoms, medications, and not just relying on doctors to prescribe "solutions."  There was also an emphasis on changing for self, not for others.  The patient expressed that when she relapses it is often because of pain, and that triggers her.  She has Bipolar Disorder, and we discussed saying that she "has" Bipolar Disorder as opposed to "I'm bipolar."  She also conveyed to the group that asking for help is a strength.  Type of Therapy:  Group Therapy  Participation Level:  Active  Participation Quality:  Appropriate, Attentive, Sharing and Supportive  Affect:  Appropriate and Blunted  Cognitive:  Alert, Appropriate and Oriented  Insight:  Engaged  Engagement in Therapy:  Engaged  Modes of Intervention:  Discussion, Exploration and Support  [name] 07/23/2012, 11:09 AM

## 2012-07-23 NOTE — Progress Notes (Signed)
Psychoeducational Group Note  Date:  07/23/2012 Time:  0945 am  Group Topic/Focus:  Identifying Needs:   The focus of this group is to help patients identify their personal needs that have been historically problematic and identify healthy behaviors to address their needs.  Participation Level:  Did Not Attend Brenda [name] 07/23/2012,2:04 PM

## 2012-07-23 NOTE — Progress Notes (Signed)
Patient ID: Brenda [name], female   DOB: September 25, 1960, 52 y.o.   MRN: [id number] D)  Has been out and about on the hall and in the milieu this evening, attended group, had a snack, came to med window afterward for hs meds.  Was somewhat intrusive and requested everything she knew she could have at that time.  Stated it would take everything to help her sleep tonight.  Multiple c/o's of anxiety, nausea, and pain.  Initially said she knew she could have a pain shot, but explained to her it hadn't been ordered.  Wanted to argue, but agreed to take what she had ordered and discuss it with her MD.  Micah Flesher to bed and has been sleeping. A)  Will continue to monitor for safety, continue POC R)  Safety maintained.

## 2012-07-23 NOTE — Progress Notes (Signed)
[facility] MD Progress Note  07/23/2012 4:47 PM Brenda [name]  MRN:  [id number] Subjective:  Brenda [name] states she is still having a hard time. She is having difficulties with sleep, still feels she has a lot of anxiety and feels that she needs to have at least two more shots of Toradol. She still does not know where she is going to go. She is not "going home." She is willing to go to a shelter.  Diagnosis:  Bipolar disorder, PTSD, Substance Abuse  ADL's:  Intact  Sleep: Poor  Appetite:  Poor  Suicidal Ideation:  Plan:  denies Intent:  denies Means:  denies Homicidal Ideation:  Plan:  denies Intent:  denies Means:  denies AEB (as evidenced by):  Psychiatric Specialty Exam: Review of Systems  Constitutional: Negative.   HENT: Negative.   Eyes: Negative.   Respiratory: Negative.   Cardiovascular: Negative.   Gastrointestinal: Negative.   Genitourinary: Negative.   Musculoskeletal: Positive for back pain.  Skin: Negative.   Neurological: Negative.   Endo/Heme/Allergies: Negative.   Psychiatric/Behavioral: Positive for depression and substance abuse. The patient is nervous/anxious and has insomnia.     Blood pressure 117/75, pulse 68, temperature 97.9 F (36.6 C), temperature source Oral, resp. rate 12, height 5\' 3"  (1.6 m), weight 59.421 kg (131 lb), SpO2 98.00%.Body mass index is 23.21 kg/(m^2).  General Appearance: Fairly Groomed  Patent attorney::  Fair  Speech:  Clear and Coherent and rapid  Volume:  Increased  Mood:  Anxious and worried, "in pain"  Affect:  Restricted  Thought Process:  Coherent and Goal Directed  Orientation:  Full (Time, Place, and Person)  Thought Content:  somatically focused, worries, concerns  Suicidal Thoughts:  No  Homicidal Thoughts:  No  Memory:  Immediate;   Fair Recent;   Fair Remote;   Fair  Judgement:  Fair  Insight:  Present  Psychomotor Activity:  Restlessness  Concentration:  Fair  Recall:  Fair  Akathisia:  No  Handed:  Right  AIMS (if  indicated):     Assets:  Desire for Improvement  Sleep:  Number of Hours: 6   Current Medications: Current Facility-Administered Medications  Medication Dose Route Frequency Provider Last Rate Last Dose  . acetaminophen (TYLENOL) tablet 650 mg  650 mg Oral Q6H PRN Verne Spurr, PA-C      . alum & mag hydroxide-simeth (MAALOX/MYLANTA) 200-200-20 MG/5ML suspension 30 mL  30 mL Oral Q4H PRN Verne Spurr, PA-C      . amLODipine (NORVASC) tablet 10 mg  10 mg Oral Daily Mojeed Akintayo   10 mg at 07/23/12 0812  . aspirin chewable tablet 81 mg  81 mg Oral Daily Mojeed Akintayo   81 mg at 07/23/12 0812  . atorvastatin (LIPITOR) tablet 20 mg  20 mg Oral q1800 Lauren Bajbus, RPH   20 mg at 07/22/12 1820  . clonazePAM (KLONOPIN) tablet 0.5 mg  0.5 mg Oral BID PRN Verne Spurr, PA-C   0.5 mg at 07/23/12 0656  . cloNIDine (CATAPRES) tablet 0.1 mg  0.1 mg Oral QID Sanjuana Kava, NP   0.1 mg at 07/23/12 1144   Followed by  . [START ON 07/24/2012] cloNIDine (CATAPRES) tablet 0.1 mg  0.1 mg Oral BH-qamhs Sanjuana Kava, NP       Followed by  . [START ON 07/26/2012] cloNIDine (CATAPRES) tablet 0.1 mg  0.1 mg Oral QAC breakfast Sanjuana Kava, NP   0.1 mg at 07/22/12 1448  . dicyclomine (BENTYL) tablet 20  mg  20 mg Oral Q6H PRN Sanjuana Kava, NP   20 mg at 07/22/12 2144  . divalproex (DEPAKOTE) DR tablet 500 mg  500 mg Oral BID PC Mojeed Akintayo   500 mg at 07/23/12 0812  . gabapentin (NEURONTIN) capsule 400 mg  400 mg Oral TID Rachael Fee, MD   400 mg at 07/23/12 1143  . hydrochlorothiazide (HYDRODIURIL) tablet 25 mg  25 mg Oral Daily Verne Spurr, PA-C   25 mg at 07/23/12 0813  . hydrOXYzine (ATARAX/VISTARIL) tablet 25 mg  25 mg Oral Q6H PRN Sanjuana Kava, NP   25 mg at 07/23/12 0657  . lidocaine (LIDODERM) 5 % 1 patch  1 patch Transdermal Q24H Rachael Fee, MD      . lisinopril (PRINIVIL,ZESTRIL) tablet 5 mg  5 mg Oral Daily Mojeed Akintayo   5 mg at 07/23/12 0813  . loperamide (IMODIUM) capsule 2-4 mg   2-4 mg Oral PRN Sanjuana Kava, NP      . magnesium hydroxide (MILK OF MAGNESIA) suspension 30 mL  30 mL Oral Daily PRN Verne Spurr, PA-C      . meloxicam (MOBIC) tablet 15 mg  15 mg Oral Daily Verne Spurr, PA-C   15 mg at 07/23/12 0813  . methocarbamol (ROBAXIN) tablet 500 mg  500 mg Oral Q8H PRN Sanjuana Kava, NP   500 mg at 07/23/12 0656  . mirtazapine (REMERON SOL-TAB) disintegrating tablet 30 mg  30 mg Oral QHS Rachael Fee, MD      . naproxen (NAPROSYN) tablet 500 mg  500 mg Oral BID PRN Sanjuana Kava, NP      . nicotine (NICODERM CQ - dosed in mg/24 hours) patch 21 mg  21 mg Transdermal Daily Mojeed Akintayo   21 mg at 07/23/12 0813  . nitroGLYCERIN (NITROSTAT) SL tablet 0.4 mg  0.4 mg Sublingual Q5 min PRN Verne Spurr, PA-C      . ondansetron (ZOFRAN-ODT) disintegrating tablet 4 mg  4 mg Oral Q6H PRN Sanjuana Kava, NP   4 mg at 07/23/12 1556  . pantoprazole (PROTONIX) EC tablet 80 mg  80 mg Oral Daily Verne Spurr, PA-C   80 mg at 07/23/12 0813  . sertraline (ZOLOFT) tablet 50 mg  50 mg Oral Daily Mojeed Akintayo   50 mg at 07/23/12 0813  . traMADol (ULTRAM) tablet 50 mg  50 mg Oral Q6H PRN Verne Spurr, PA-C   50 mg at 07/23/12 1556    Lab Results:  Results for orders placed during the hospital encounter of 07/18/12 (from the past 48 hour(s))  VALPROIC ACID LEVEL     Status: None   Collection Time    07/23/12  6:50 AM      Result Value Range   Valproic Acid Lvl 87.6  50.0 - 100.0 ug/mL    Physical Findings: AIMS: Facial and Oral Movements Muscles of Facial Expression: None, normal Lips and Perioral Area: None, normal Jaw: None, normal Tongue: None, normal,Extremity Movements Upper (arms, wrists, hands, fingers): None, normal Lower (legs, knees, ankles, toes): None, normal, Trunk Movements Neck, shoulders, hips: None, normal, Overall Severity Severity of abnormal movements (highest score from questions above): None, normal Incapacitation due to abnormal movements:  None, normal Patient's awareness of abnormal movements (rate only patient's report): No Awareness, Dental Status Current problems with teeth and/or dentures?: No Does patient usually wear dentures?: No  CIWA:  CIWA-Ar Total: 5 COWS:  COWS Total Score: 6  Treatment Plan Summary:  Daily contact with patient to assess and evaluate symptoms and progress in treatment Medication management  Plan: Supportive approach/coping skills/relapse prevention           Increase the Remeron to 30 mg HS           Lidoderm patch to back  Medical Decision Making Problem Points:  Review of psycho-social stressors (1) Data Points:  Review of medication regiment & side effects (2) Review of new medications or change in dosage (2)  I certify that inpatient services furnished can reasonably be expected to improve the patient's condition.   Vaughan Garfinkle A 07/23/2012, 4:47 PM

## 2012-07-23 NOTE — Clinical Social Work Note (Signed)
Clinical Social Work Note  [facility] and was told there are no female beds available today.  Ambrose Mantle, LCSW 07/23/2012, 9:21 AM

## 2012-07-24 NOTE — Progress Notes (Signed)
Adult Psychoeducational Group Note  Date:  07/24/2012 Time:  4:07 AM  Group Topic/Focus:  Wrap-Up Group:   The focus of this group is to help patients review their daily goal of treatment and discuss progress on daily workbooks.  Participation Level:  Active  Participation Quality:  Appropriate  Affect:  Appropriate  Cognitive:  Appropriate  Insight: Appropriate  Engagement in Group:  Engaged  Modes of Intervention:  Support  Additional Comments:  Patient attended and participated in group tonight. She reports that her day went well. She was concern about getting SSI. She advised that he was going to retain a Lawyer to handle her case upon discharge. Patient reports that she is homeless and is actively trying to find he a place to live, however, without income her only options right now is the shelters which is full to capacity.   Lita Mains [facility] 07/24/2012, 4:07 AM

## 2012-07-24 NOTE — Progress Notes (Signed)
Adult Psychoeducational Group Note  Date:  07/24/2012 Time:  6:51 PM  Group Topic/Focus:  Spirituality:   The focus of this group is to discuss how one's spirituality can aide in recovery.  Participation Level:  Active  Participation Quality:  Appropriate, Attentive, Sharing and Supportive  Affect:  Appropriate  Cognitive:  Alert and Oriented  Insight: Appropriate and Good  Engagement in Group:  Engaged and Supportive  Modes of Intervention:  Discussion, Education and Socialization  Additional Comments:  Patient appropriate and sharing during group. Patient insightful, states "this is the new beginning because I was distraught, confused and hopeless and I did not realize I had a disease." Patient goes on to say " now I realize I have a major disease of my mind and the mind is so powerful." Patient offered support and encouragement.   Noah Charon 07/24/2012, 6:51 PM

## 2012-07-24 NOTE — Progress Notes (Signed)
Psychoeducational Group Note  Date:  07/24/2012 Time:  0945 am  Group Topic/Focus:  Making Healthy Choices:   The focus of this group is to help patients identify negative/unhealthy choices they were using prior to admission and identify positive/healthier coping strategies to replace them upon discharge.  Participation Level:  Did Not Attend   Aqil Goetting J 07/24/2012, 10:29 AM 
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
## 2012-07-24 NOTE — Progress Notes (Signed)
Patient ID: Brenda [name], female   DOB: [birth year], 52 y.o.   MRN: [id number] D)   Came to the med window at the beginning of the shift asking what meds she could have, even though she had just been medicated at half hour before the end of the previous shift with klonopin, ultram and bentyl, and before supper with robaxin, naproxen and lido patch was applied.  Had just been laughing and talking with other patients, but is very somber when she comes to the window.  Upset that she isn't getting more pain meds and said she may have to change doctors.  Tried to explain to her that by detoxing, she will be less dependent and more sensitive to the medication, but she doesn't seem to think she's getting enough medication, and asks for everything she can have several times a day.  Did attend group this evening, had snack, has had hs meds and is sitting with female peer talking. A)  Will continue to monitor for safety, continue POC, teaching R)  Safety maintained.

## 2012-07-24 NOTE — Progress Notes (Signed)
[facility] MD Progress Note  07/24/2012 1:48 PM Brenda [name]  MRN:  [id number] Subjective:  Brenda [name] states that she needs couple of more days here. She is wanting to go to the [facility] [employer], if not she is willing to go back to the [employer]. She states that she is going to get active once she is out there. Plans to [facility] the [facility] often. Would like to have a letter so she can be allowed to stay at the Shelter during the day as she feels she is not healthy enough to be out all day. She is going to pursue medical follow up with the free clinic. (There is no spontaneous mention of pain today) Still with anxiety. Asks if the Depakote will help with the anxiety once it has enough time on her system Diagnosis:  Bipolar Disorder/PTSD  ADL's:  Intact  Sleep: Fair  Appetite:  Fair  Suicidal Ideation:  Plan:  denies Intent:  denies Means:  denies Homicidal Ideation:  Plan:  denies Intent:  denies Means:  denies AEB (as evidenced by):  Psychiatric Specialty Exam: Review of Systems  Constitutional: Negative.   HENT: Negative.   Eyes: Negative.   Respiratory: Negative.   Cardiovascular: Negative.   Gastrointestinal: Negative.   Genitourinary: Negative.   Musculoskeletal: Positive for back pain.  Skin: Negative.   Neurological: Negative.   Endo/Heme/Allergies: Negative.   Psychiatric/Behavioral: The patient is nervous/anxious and has insomnia.     Blood pressure 119/63, pulse 73, temperature 97.1 F (36.2 C), temperature source Oral, resp. rate 18, height 5\' 3"  (1.6 m), weight 59.421 kg (131 lb), SpO2 98.00%.Body mass index is 23.21 kg/(m^2).  General Appearance: Fairly Groomed  Patent attorney::  Fair  Speech:  Clear and Coherent  Volume:  vaires  Mood:  Anxious and worried  Affect:  anxious, worried  Thought Process:  Coherent and Goal Directed  Orientation:  Full (Time, Place, and Person)  Thought Content:  worries, concerns about having a stable  place to live  Suicidal Thoughts:  No   Homicidal Thoughts:  No  Memory:  Immediate;   Fair Recent;   Fair Remote;   Fair  Judgement:  Fair  Insight:  Shallow  Psychomotor Activity:  Restlessness  Concentration:  Fair  Recall:  Fair  Akathisia:  No  Handed:  Right  AIMS (if indicated):     Assets:  Desire for Improvement  Sleep:  Number of Hours: 6.25   Current Medications: Current Facility-Administered Medications  Medication Dose Route Frequency Provider Last Rate Last Dose  . acetaminophen (TYLENOL) tablet 650 mg  650 mg Oral Q6H PRN Verne Spurr, PA-C      . alum & mag hydroxide-simeth (MAALOX/MYLANTA) 200-200-20 MG/5ML suspension 30 mL  30 mL Oral Q4H PRN Verne Spurr, PA-C      . amLODipine (NORVASC) tablet 10 mg  10 mg Oral Daily Mojeed Akintayo   10 mg at 07/24/12 0748  . aspirin chewable tablet 81 mg  81 mg Oral Daily Mojeed Akintayo   81 mg at 07/24/12 0748  . atorvastatin (LIPITOR) tablet 20 mg  20 mg Oral q1800 Lauren Bajbus, RPH   20 mg at 07/23/12 1712  . clonazePAM (KLONOPIN) tablet 0.5 mg  0.5 mg Oral BID PRN Verne Spurr, PA-C   0.5 mg at 07/24/12 0640  . cloNIDine (CATAPRES) tablet 0.1 mg  0.1 mg Oral BH-qamhs Sanjuana Kava, NP   0.1 mg at 07/24/12 0749   Followed by  . [START ON  07/26/2012] cloNIDine (CATAPRES) tablet 0.1 mg  0.1 mg Oral QAC breakfast Sanjuana Kava, NP   0.1 mg at 07/22/12 1448  . dicyclomine (BENTYL) tablet 20 mg  20 mg Oral Q6H PRN Sanjuana Kava, NP   20 mg at 07/22/12 2144  . divalproex (DEPAKOTE) DR tablet 500 mg  500 mg Oral BID PC Mojeed Akintayo   500 mg at 07/24/12 0749  . gabapentin (NEURONTIN) capsule 400 mg  400 mg Oral TID Rachael Fee, MD   400 mg at 07/24/12 1254  . hydrochlorothiazide (HYDRODIURIL) tablet 25 mg  25 mg Oral Daily Verne Spurr, PA-C   25 mg at 07/24/12 0748  . hydrOXYzine (ATARAX/VISTARIL) tablet 25 mg  25 mg Oral Q6H PRN Sanjuana Kava, NP   25 mg at 07/24/12 0929  . lidocaine (LIDODERM) 5 % 1 patch  1 patch Transdermal Q24H Rachael Fee, MD   1 patch  at 07/23/12 1712  . lisinopril (PRINIVIL,ZESTRIL) tablet 5 mg  5 mg Oral Daily Mojeed Akintayo   5 mg at 07/24/12 0748  . loperamide (IMODIUM) capsule 2-4 mg  2-4 mg Oral PRN Sanjuana Kava, NP      . magnesium hydroxide (MILK OF MAGNESIA) suspension 30 mL  30 mL Oral Daily PRN Verne Spurr, PA-C      . meloxicam (MOBIC) tablet 15 mg  15 mg Oral Daily Verne Spurr, PA-C   15 mg at 07/24/12 0748  . methocarbamol (ROBAXIN) tablet 500 mg  500 mg Oral Q8H PRN Sanjuana Kava, NP   500 mg at 07/24/12 0640  . mirtazapine (REMERON SOL-TAB) disintegrating tablet 30 mg  30 mg Oral QHS Rachael Fee, MD   30 mg at 07/23/12 2146  . naproxen (NAPROSYN) tablet 500 mg  500 mg Oral BID PRN Sanjuana Kava, NP   500 mg at 07/23/12 2001  . nicotine (NICODERM CQ - dosed in mg/24 hours) patch 21 mg  21 mg Transdermal Daily Mojeed Akintayo   21 mg at 07/24/12 0643  . nitroGLYCERIN (NITROSTAT) SL tablet 0.4 mg  0.4 mg Sublingual Q5 min PRN Verne Spurr, PA-C   0.4 mg at 07/24/12 0937  . ondansetron (ZOFRAN-ODT) disintegrating tablet 4 mg  4 mg Oral Q6H PRN Sanjuana Kava, NP   4 mg at 07/23/12 1556  . pantoprazole (PROTONIX) EC tablet 80 mg  80 mg Oral Daily Verne Spurr, PA-C   80 mg at 07/24/12 0747  . sertraline (ZOLOFT) tablet 50 mg  50 mg Oral Daily Mojeed Akintayo   50 mg at 07/24/12 0748  . traMADol (ULTRAM) tablet 50 mg  50 mg Oral Q6H PRN Verne Spurr, PA-C   50 mg at 07/24/12 1254    Lab Results:  Results for orders placed during the hospital encounter of 07/18/12 (from the past 48 hour(s))  VALPROIC ACID LEVEL     Status: None   Collection Time    07/23/12  6:50 AM      Result Value Range   Valproic Acid Lvl 87.6  50.0 - 100.0 ug/mL    Physical Findings: AIMS: Facial and Oral Movements Muscles of Facial Expression: None, normal Lips and Perioral Area: None, normal Jaw: None, normal Tongue: None, normal,Extremity Movements Upper (arms, wrists, hands, fingers): None, normal Lower (legs, knees,  ankles, toes): None, normal, Trunk Movements Neck, shoulders, hips: None, normal, Overall Severity Severity of abnormal movements (highest score from questions above): None, normal Incapacitation due to abnormal movements: None, normal Patient's  awareness of abnormal movements (rate only patient's report): No Awareness, Dental Status Current problems with teeth and/or dentures?: No Does patient usually wear dentures?: No  CIWA:  CIWA-Ar Total: 3 COWS:  COWS Total Score: 6  Treatment Plan Summary: Daily contact with patient to assess and evaluate symptoms and progress in treatment Medication management  Plan: Supportive approach/coping skills            Optimize treatment with Depakote/Zoloft/Remeron/Neurontin            Facilitate placement  Medical Decision Making Problem Points:  Review of psycho-social stressors (1) Data Points:  Review of medication regiment & side effects (2)  I certify that inpatient services furnished can reasonably be expected to improve the patient's condition.   Joanna Borawski A 07/24/2012, 1:48 PM

## 2012-07-24 NOTE — Progress Notes (Signed)
D patient slept well last nite w/meds, appetite is improving, energy level is normal, ability to pay attention is improving, depressed 6/10 and hopeless 5/10, no WD s/s noted, denies Si or HI and no AVH, taking meds as ordered by MD, taking prn meds as needed for pain, angina and nausea and good results. Going to DR for meals, interacting w/peers on the unit and other units, very talkative and intrusive most of the time. A q18min safety checks continue and support offered, POC continues, encouraged to attend group on her issues on [address] R patient remains safe on the unit

## 2012-07-24 NOTE — Progress Notes (Signed)
Adult Psychoeducational Group Note  Date:  07/24/2012 Time:  0900  Group Topic/Focus:  Self inventory sheet  Participation Level:  Did Not Attend   Roselee Culver 07/24/2012, 11:04 AM

## 2012-07-24 NOTE — [facility] Group Notes (Signed)
[facility] Group Notes: (Clinical Social Work)   07/24/2012      Type of Therapy:  Group Therapy   Participation Level:  Did Not Attend    Ambrose Mantle, LCSW 07/24/2012, 12:25 PM

## 2012-07-25 DIAGNOSIS — F3110 Bipolar disorder, current episode manic without psychotic features, unspecified: Principal | ICD-10-CM

## 2012-07-25 DIAGNOSIS — F29 Unspecified psychosis not due to a substance or known physiological condition: Secondary | ICD-10-CM

## 2012-07-25 MED ORDER — MIRTAZAPINE 30 MG PO TBDP: 30.0000 mg | ORAL_TABLET | Freq: Every day | ORAL | Status: DC
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
## 2012-07-25 NOTE — Tx Team (Signed)
  Interdisciplinary Treatment Plan Update   Date Reviewed:  07/25/2012  Time Reviewed:  11:29 AM  Progress in Treatment:   Attending groups: Yes Participating in groups: Yes Taking medication as prescribed: Yes  Tolerating medication: Yes Family/Significant other contact made: Yes  Patient understands diagnosis: Yes  As evidenced by asking for help with depression and anxiety secondary to trauma  Discussing patient identified problems/goals with staff: Yes See initial plan Medical problems stabilized or resolved: Yes Denies suicidal/homicidal ideation:Yes  In tx team Patient has not harmed self or others: Yes  For review of initial/current patient goals, please see plan of care.  Estimated Length of Stay: D/C today  Reason for Continuation of Hospitalization:   New Problems/Goals identified:  N/A  Discharge Plan or Barriers:   D/C to friend, follow up outpt     Attendees:  Signature: Thedore Mins, MD  07/25/2012 11:29 AM   Signature: Richelle Ito, LCSW 07/25/2012 11:29 AM  Signature: Robbie Louis, RN 07/25/2012 11:29 AM  Signature:  07/25/2012 11:29 AM  Signature:  07/25/2012 11:29 AM  Signature:  07/25/2012 11:29 AM  Signature:   07/25/2012 11:29 AM  Signature:    Signature:    Signature:    Signature:    Signature:    Signature:      Scribe for Treatment Team:   Richelle Ito, LCSW  07/25/2012 11:29 AM
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
## 2012-07-25 NOTE — Progress Notes (Signed)
[facility] Adult Case Management Discharge Plan :  Will you be returning to the same living situation after discharge: No. At discharge, do you have transportation home?:Yes,  bus pass Do you have the ability to pay for your medications:Yes,  mental health  Release of information consent forms completed and in the chart;  Patient's signature needed at discharge.  Patient to Follow up at: Follow-up Information   Follow up with [facility]. (Walk in between 8AM-9AM Monday thru Friday for hospital follow-up.)    Contact information:   [address] [place], [place] [address] phone: [phone] fax: [phone]       Follow up with [facility] On 08/02/2012. (Appt. at 1:00PM with therapist Lorelee Market.)    Contact information:   [facility] [address] [place], [place] [address] Phone: [phone] Fax: [phone]      Patient denies SI/HI:   Yes,  yes    Safety Planning and Suicide Prevention discussed:  Yes,  yes  Ida Rogue 07/25/2012, 11:26 AM

## 2012-07-25 NOTE — Progress Notes (Signed)
Adult Psychoeducational Group Note  Date:  07/25/2012 Time:  4:37 AM  Group Topic/Focus:  Wrap-Up Group:   The focus of this group is to help patients review their daily goal of treatment and discuss progress on daily workbooks.  Participation Level:  Active  Participation Quality:  Appropriate  Affect:  Appropriate  Cognitive:  Appropriate  Insight: Improving  Engagement in Group:  Engaged  Modes of Intervention:  Support  Additional Comments:  Patient attended and participated in group tonight. She reports having a good day. She attended her groups, and did some coloring. Patient advised that she had an angina attack today which she received nitro for. She reports that her SW was assisting her with housing. She wants to get involve with the [employer] to secure permanent housing. She advised that under no circumstances will she be returning home.  Lita Mains [facility] 07/25/2012, 4:37 AM

## 2012-07-25 NOTE — Progress Notes (Signed)
Adult Psychoeducational Group Note  Date:  07/25/2012 Time:  10:50 AM  Group Topic/Focus:  Dimensions of Wellness:   The focus of this group is to introduce the topic of wellness and discuss the role each dimension of wellness plays in total health.  Participation Level:  Did Not Attend   Additional Comments:  Pt did not attend group because she had concerns about discharge.  Audery Wassenaar T 07/25/2012, 10:50 AM

## 2012-07-26 MED ORDER — NITROGLYCERIN 0.4 MG SL SUBL: 0.4000 mg | SUBLINGUAL_TABLET | SUBLINGUAL | Status: DC | PRN

## 2012-07-27 NOTE — Progress Notes (Signed)
Patient Discharge Instructions:  After Visit Summary (AVS):   Faxed to:  07/27/12 Discharge Summary Note:   Faxed to:  07/27/12 Psychiatric Admission Assessment Note:   Faxed to:  07/27/12 Suicide Risk Assessment - Discharge Assessment:   Faxed to:  07/27/12 Faxed/Sent to the Next Level Care provider:  07/27/12 Faxed to [facility] @ [phone] Faxed to [facility] @ [phone]  Jerelene Redden, 07/27/2012, 4:08 PM

## 2012-07-27 NOTE — Discharge Summary (Signed)
Seen and agreed. Ayame Rena, MD 

## 2012-07-29 ENCOUNTER — Encounter: Payer: Self-pay | Admitting: Physician Assistant

## 2012-08-04 ENCOUNTER — Ambulatory Visit: Payer: No Typology Code available for payment source | Admitting: Physical Medicine & Rehabilitation

## 2012-08-07 ENCOUNTER — Encounter (HOSPITAL_COMMUNITY): Payer: Self-pay | Admitting: Emergency Medicine

## 2012-08-07 ENCOUNTER — Emergency Department (HOSPITAL_COMMUNITY)
Admission: EM | Admit: 2012-08-07 | Discharge: 2012-08-07 | Disposition: A | Discharge status: Home / Self Care | Payer: Self-pay | Attending: Emergency Medicine | Admitting: Emergency Medicine

## 2012-08-07 ENCOUNTER — Emergency Department (HOSPITAL_COMMUNITY): Payer: Self-pay

## 2012-08-07 DIAGNOSIS — Z8742 Personal history of other diseases of the female genital tract: Secondary | ICD-10-CM | POA: Insufficient documentation

## 2012-08-07 DIAGNOSIS — I252 Old myocardial infarction: Secondary | ICD-10-CM | POA: Insufficient documentation

## 2012-08-07 DIAGNOSIS — F411 Generalized anxiety disorder: Secondary | ICD-10-CM | POA: Insufficient documentation

## 2012-08-07 DIAGNOSIS — I2510 Atherosclerotic heart disease of native coronary artery without angina pectoris: Secondary | ICD-10-CM | POA: Insufficient documentation

## 2012-08-07 DIAGNOSIS — Z8781 Personal history of (healed) traumatic fracture: Secondary | ICD-10-CM | POA: Insufficient documentation

## 2012-08-07 DIAGNOSIS — F17200 Nicotine dependence, unspecified, uncomplicated: Secondary | ICD-10-CM | POA: Insufficient documentation

## 2012-08-07 DIAGNOSIS — R609 Edema, unspecified: Secondary | ICD-10-CM | POA: Insufficient documentation

## 2012-08-07 DIAGNOSIS — G8929 Other chronic pain: Secondary | ICD-10-CM | POA: Insufficient documentation

## 2012-08-07 DIAGNOSIS — Z7982 Long term (current) use of aspirin: Secondary | ICD-10-CM | POA: Insufficient documentation

## 2012-08-07 DIAGNOSIS — F329 Major depressive disorder, single episode, unspecified: Secondary | ICD-10-CM | POA: Insufficient documentation

## 2012-08-07 DIAGNOSIS — I509 Heart failure, unspecified: Secondary | ICD-10-CM | POA: Insufficient documentation

## 2012-08-07 DIAGNOSIS — Z8719 Personal history of other diseases of the digestive system: Secondary | ICD-10-CM | POA: Insufficient documentation

## 2012-08-07 DIAGNOSIS — E785 Hyperlipidemia, unspecified: Secondary | ICD-10-CM | POA: Insufficient documentation

## 2012-08-07 DIAGNOSIS — Z87448 Personal history of other diseases of urinary system: Secondary | ICD-10-CM | POA: Insufficient documentation

## 2012-08-07 DIAGNOSIS — I10 Essential (primary) hypertension: Secondary | ICD-10-CM | POA: Insufficient documentation

## 2012-08-07 DIAGNOSIS — F3289 Other specified depressive episodes: Secondary | ICD-10-CM | POA: Insufficient documentation

## 2012-08-07 DIAGNOSIS — Z8673 Personal history of transient ischemic attack (TIA), and cerebral infarction without residual deficits: Secondary | ICD-10-CM | POA: Insufficient documentation

## 2012-08-07 DIAGNOSIS — Z79899 Other long term (current) drug therapy: Secondary | ICD-10-CM | POA: Insufficient documentation

## 2012-08-07 LAB — CBC
HCT: 33.4 % — ABNORMAL LOW (ref 36.0–46.0)
Hemoglobin: 11.6 g/dL — ABNORMAL LOW (ref 12.0–15.0)
MCH: 32.8 pg (ref 26.0–34.0)
MCHC: 34.7 g/dL (ref 30.0–36.0)

## 2012-08-07 LAB — POCT I-STAT, CHEM 8
BUN: 8 mg/dL (ref 6–23)
Calcium, Ion: 1.16 mmol/L (ref 1.12–1.23)
Creatinine, Ser: 0.7 mg/dL (ref 0.50–1.10)
Sodium: 142 mEq/L (ref 135–145)
TCO2: 27 mmol/L (ref 0–100)

## 2012-08-07 MED ORDER — POTASSIUM CHLORIDE ER 10 MEQ PO TBCR: 10.0000 meq | EXTENDED_RELEASE_TABLET | Freq: Two times a day (BID) | ORAL | Status: DC

## 2012-08-07 MED ORDER — FUROSEMIDE 20 MG PO TABS: 20.0000 mg | ORAL_TABLET | Freq: Every day | ORAL | Status: DC

## 2012-08-07 NOTE — ED Notes (Signed)
Pt. Stated, I have put on 10lbs. Since 14th of April.  I'm having a lot of swelling in both feet and ankles,  Its sore for me to walk because of the tightness.  I've had CHF before. I doubled up on my HCTZ this week because of the swelling

## 2012-08-07 NOTE — ED Notes (Signed)
Pt presents to department for evaluation of bilateral lower leg and bilateral hand swelling. Ongoing for several days. Denies chest pain. Respirations unlabored. Lung sounds clear and equal bilaterally. Edema noted to bilateral lower extremities. Pt is alert and oriented x4. No signs of acute distress noted at the time.
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
## 2012-08-15 ENCOUNTER — Encounter (HOSPITAL_COMMUNITY): Payer: Self-pay | Admitting: *Deleted

## 2012-08-15 ENCOUNTER — Emergency Department (HOSPITAL_COMMUNITY)
Admission: EM | Admit: 2012-08-15 | Discharge: 2012-08-15 | Disposition: A | Discharge status: Home / Self Care | Payer: Self-pay | Attending: Emergency Medicine | Admitting: Emergency Medicine

## 2012-08-15 DIAGNOSIS — M545 Low back pain, unspecified: Secondary | ICD-10-CM | POA: Insufficient documentation

## 2012-08-15 DIAGNOSIS — F3289 Other specified depressive episodes: Secondary | ICD-10-CM | POA: Insufficient documentation

## 2012-08-15 DIAGNOSIS — I2510 Atherosclerotic heart disease of native coronary artery without angina pectoris: Secondary | ICD-10-CM | POA: Insufficient documentation

## 2012-08-15 DIAGNOSIS — I10 Essential (primary) hypertension: Secondary | ICD-10-CM | POA: Insufficient documentation

## 2012-08-15 DIAGNOSIS — F411 Generalized anxiety disorder: Secondary | ICD-10-CM | POA: Insufficient documentation

## 2012-08-15 DIAGNOSIS — E785 Hyperlipidemia, unspecified: Secondary | ICD-10-CM | POA: Insufficient documentation

## 2012-08-15 DIAGNOSIS — R52 Pain, unspecified: Secondary | ICD-10-CM | POA: Insufficient documentation

## 2012-08-15 DIAGNOSIS — Z8673 Personal history of transient ischemic attack (TIA), and cerebral infarction without residual deficits: Secondary | ICD-10-CM | POA: Insufficient documentation

## 2012-08-15 DIAGNOSIS — F329 Major depressive disorder, single episode, unspecified: Secondary | ICD-10-CM | POA: Insufficient documentation

## 2012-08-15 DIAGNOSIS — Z87448 Personal history of other diseases of urinary system: Secondary | ICD-10-CM | POA: Insufficient documentation

## 2012-08-15 DIAGNOSIS — F17200 Nicotine dependence, unspecified, uncomplicated: Secondary | ICD-10-CM | POA: Insufficient documentation

## 2012-08-15 DIAGNOSIS — Z951 Presence of aortocoronary bypass graft: Secondary | ICD-10-CM | POA: Insufficient documentation

## 2012-08-15 DIAGNOSIS — Z79899 Other long term (current) drug therapy: Secondary | ICD-10-CM | POA: Insufficient documentation

## 2012-08-15 MED ORDER — OXYCODONE-ACETAMINOPHEN 5-325 MG PO TABS
1.0000 | ORAL_TABLET | Freq: Once | ORAL | Status: AC
  Administered 2012-08-15: 1 via ORAL
  Filled 2012-08-15: qty 1

## 2012-08-15 MED ORDER — DIAZEPAM 5 MG PO TABS: 5.0000 mg | ORAL_TABLET | Freq: Two times a day (BID) | ORAL | Status: DC

## 2012-08-15 MED ORDER — HYDROCODONE-ACETAMINOPHEN 5-325 MG PO TABS: 1.0000 | ORAL_TABLET | Freq: Four times a day (QID) | ORAL | Status: DC | PRN
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
## 2012-08-15 NOTE — ED Notes (Signed)
Hx. Of bulging disc in lumbar region. Chronic pain. Homeless. Pt. Been walking around more, inc. Sciatic nerve pain down rt. Leg. Did take vicodin with no relief. No trauma, no falls.

## 2012-08-29 IMAGING — MR MR LUMBAR SPINE WO/W CM
4 of 7 series · 19 of 48 positions shown · IV contrast (multihance)
Comparison: MRI 11/10/2006.  CT 01/15/2010.

CLINICAL DATA: Low back pain radiating to the right hip and leg.

MRI LUMBAR SPINE WITHOUT AND WITH CONTRAST
TECHNIQUE: Multiplanar and multiecho pulse sequences of the lumbar
spine were obtained without and with intravenous contrast.
Contrast: 12 ml Multihance

[Series 3: T2 · sagittal · 4.0mm · 0.55mm/px · 3 of 12 slices shown (1 of 2)]
[im 1/12]
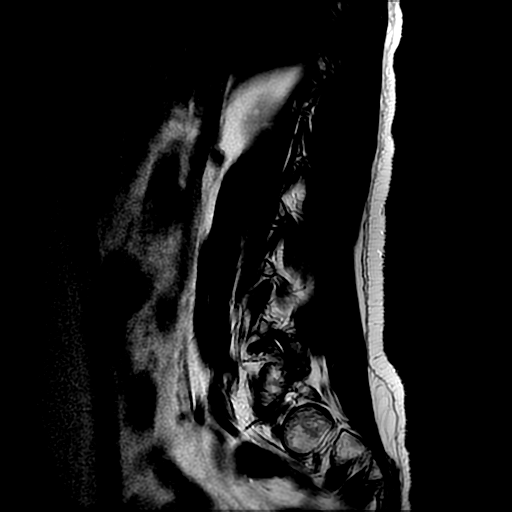
[im 6/12]
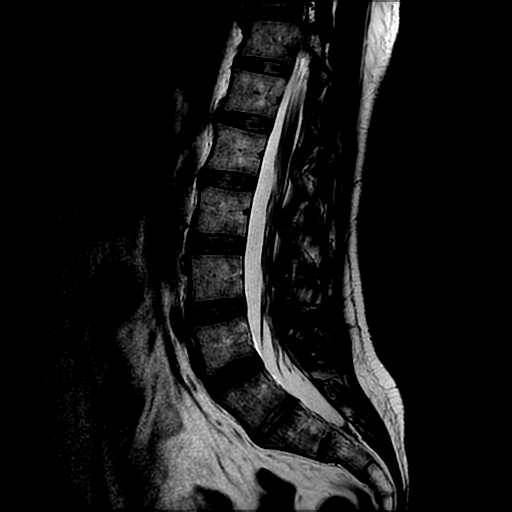
[im 12/12]
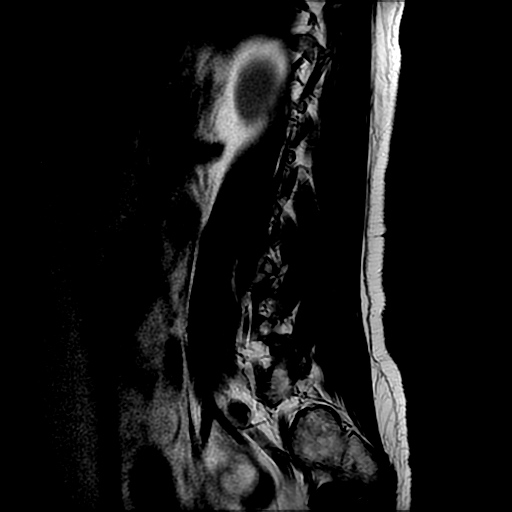

[Series 4: T1 · sagittal · 4.0mm · 0.55mm/px · 3 of 12 slices shown (1 of 2)]
[im 1/12]
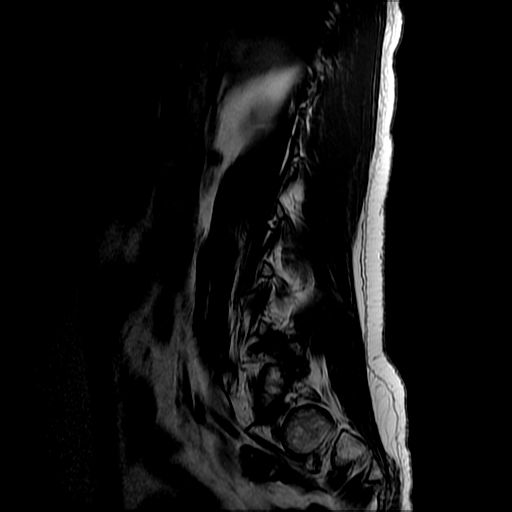
[im 6/12]
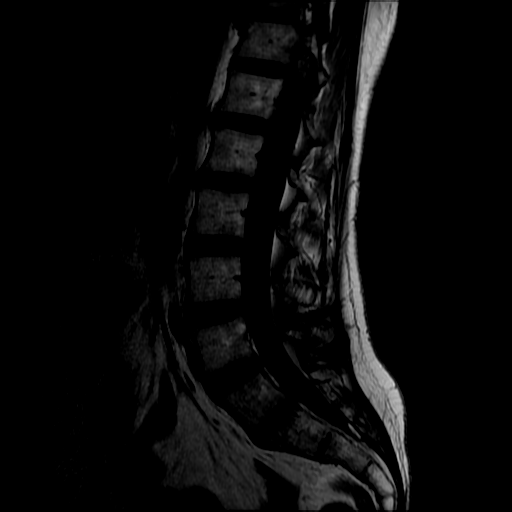
[im 12/12]
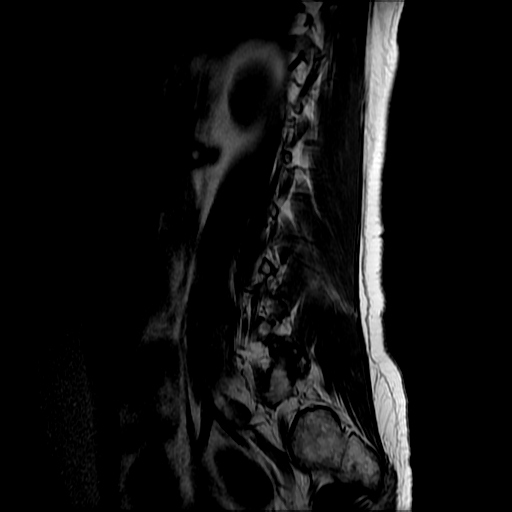

[Series 6: T2 · axial · 4.0mm · 0.41mm/px · z∈[-121,+109]mm · 9 of 42 slices shown (2 of 2)]
[im 1/42]
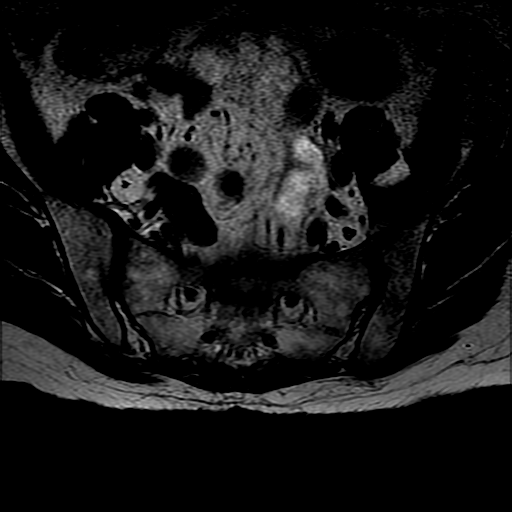
[im 7/42]
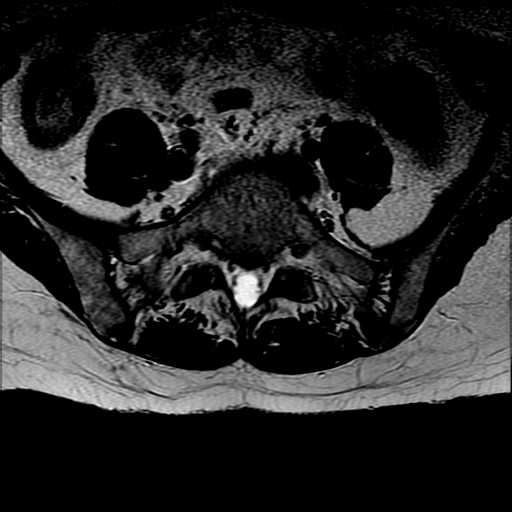
[im 14/42]
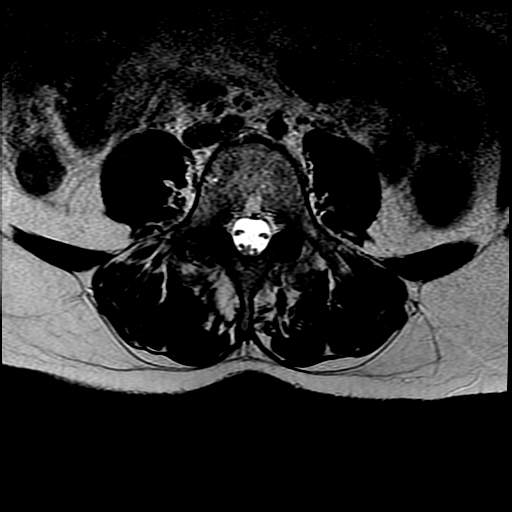
[im 18/42]
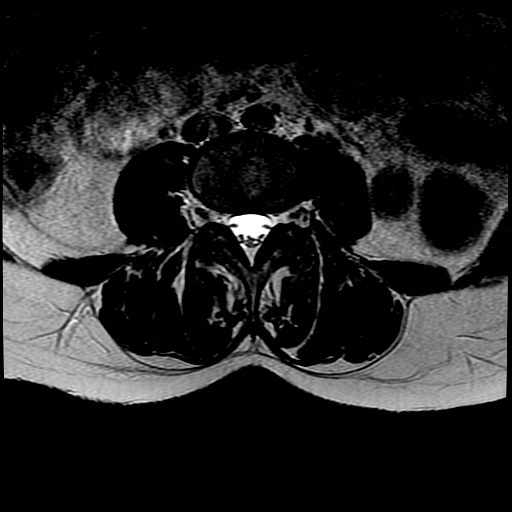
[im 21/42]
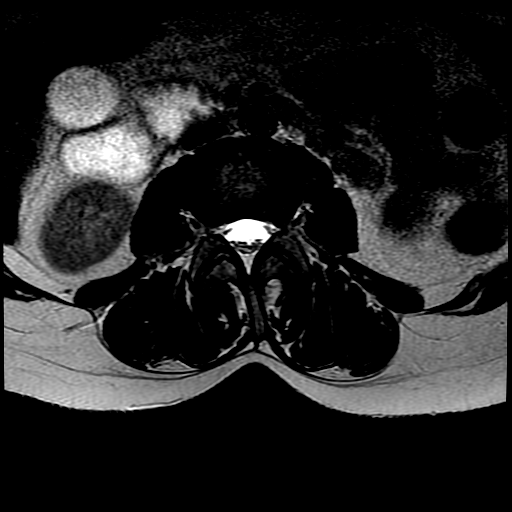
[im 24/42]
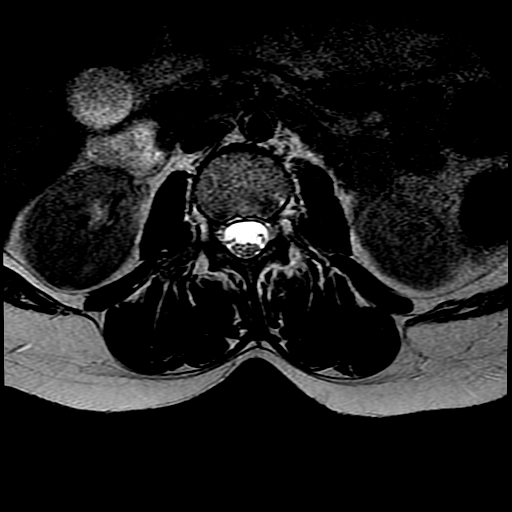
[im 28/42]
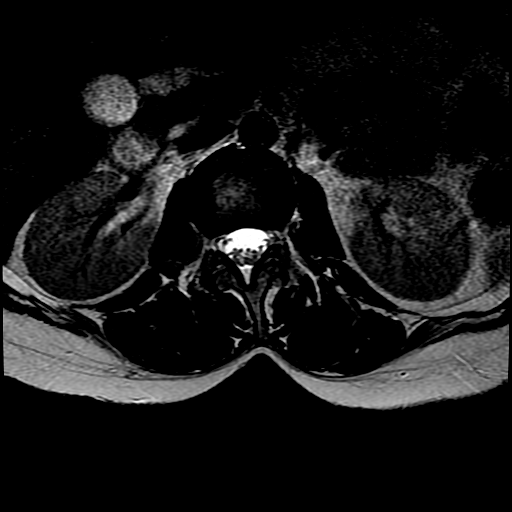
[im 35/42]
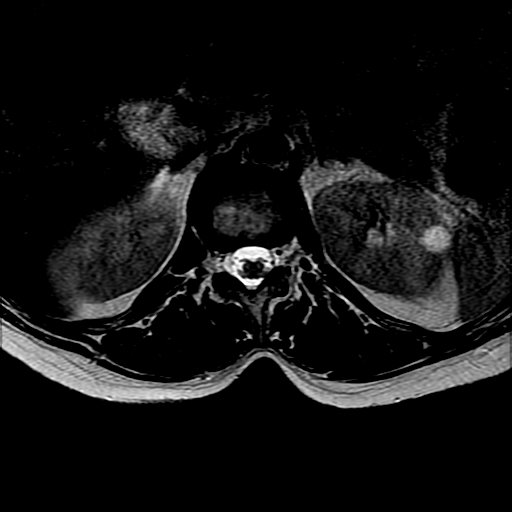
[im 42/42]
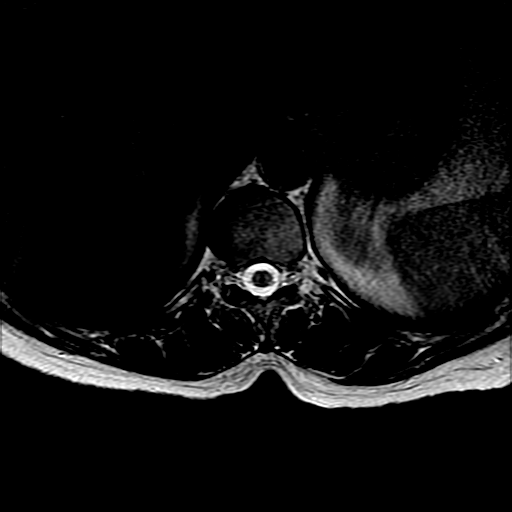

[Series 7: T1 · axial · 4.0mm · 0.41mm/px · z∈[-116,+79]mm · 4 of 28 slices shown (2 of 2)]
[im 1/28]
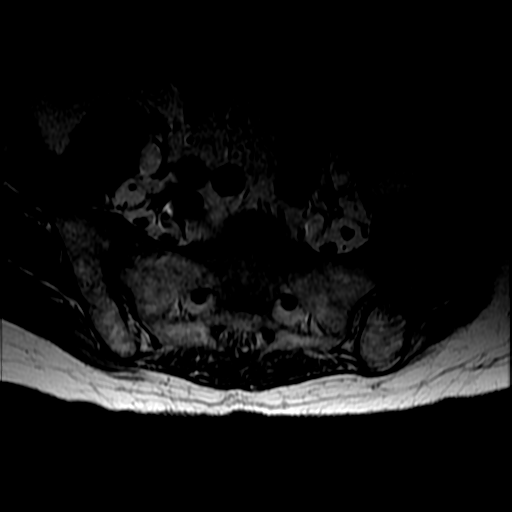
[im 4/28]
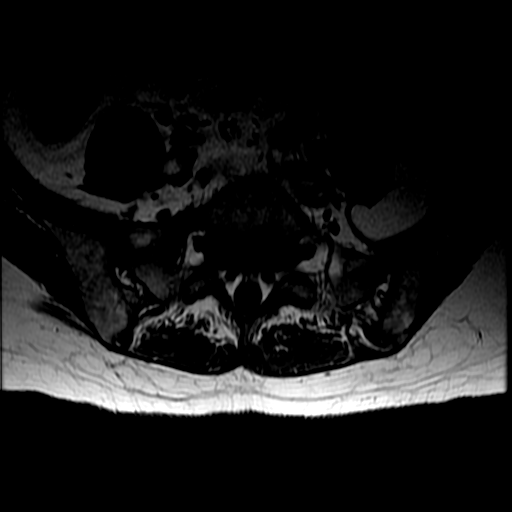
[im 16/28]
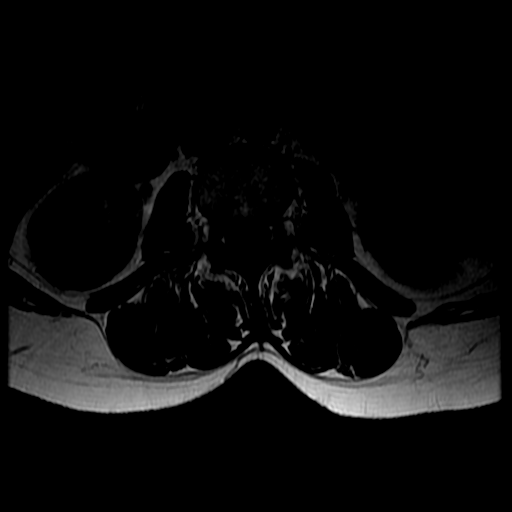
[im 24/28]
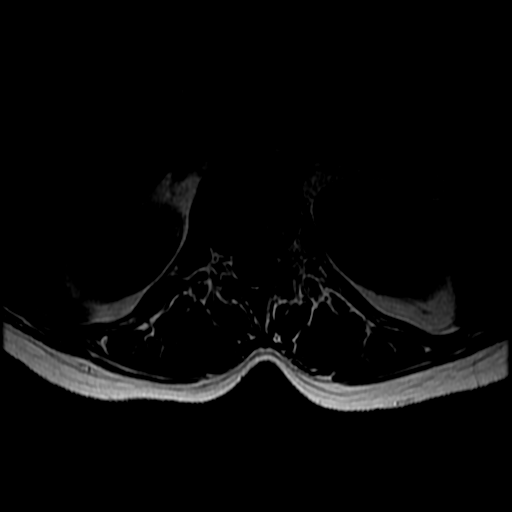

[19 of 48 positions shown; findings below may reference images not displayed]

FINDINGS: There is no abnormality at L2-3 or above.  The discs are
unremarkable.  The canal and foramina are widely patent.  The
distal cord and conus are normal.

At L3-4, the disc is normal.  There is hypertrophic degeneration of
the facets without slippage or stenosis.  There is  more edema and
enhancement on the left than the right at this level.  Findings
could contribute to back pain/facet syndrome.

At L4-5, the disc shows desiccation with annular tearing and
annular bulging.  This indents the thecal sac slightly but does not
appear to cause compressive stenosis.  There is bilateral facet
degeneration with edematous change but no slippage.  Certainly,
these findings could relate to low back pain/facet syndrome.
Edematous changes more pronounced on the right.

At L5-S1, the disc is normal.  No facet arthropathy.  The canal and
foramina are widely patent.
IMPRESSION: L3-4:  Facet degeneration bilaterally that could contribute to back
pain.  Findings are slightly more pronounced on the left.

L4-5:  Facet degeneration bilaterally that could contribute to back
pain.  Findings slightly more pronounced on the right.  Annular
tearing and annular bulging at this level that could contribute to
back pain.  No apparent neural compressive stenosis.

The facet arthropathy has worsened since the previous MRI of 4332.
The disc degeneration at L4-5 appears quite similar.

## 2012-09-12 ENCOUNTER — Encounter (HOSPITAL_COMMUNITY): Payer: Self-pay | Admitting: *Deleted

## 2012-09-12 ENCOUNTER — Emergency Department (HOSPITAL_COMMUNITY)
Admission: EM | Admit: 2012-09-12 | Discharge: 2012-09-12 | Disposition: A | Discharge status: Home / Self Care | Payer: Self-pay | Attending: Emergency Medicine | Admitting: Emergency Medicine

## 2012-09-12 DIAGNOSIS — Z3202 Encounter for pregnancy test, result negative: Secondary | ICD-10-CM | POA: Insufficient documentation

## 2012-09-12 DIAGNOSIS — I252 Old myocardial infarction: Secondary | ICD-10-CM | POA: Insufficient documentation

## 2012-09-12 DIAGNOSIS — Z87448 Personal history of other diseases of urinary system: Secondary | ICD-10-CM | POA: Insufficient documentation

## 2012-09-12 DIAGNOSIS — I2510 Atherosclerotic heart disease of native coronary artery without angina pectoris: Secondary | ICD-10-CM | POA: Insufficient documentation

## 2012-09-12 DIAGNOSIS — E785 Hyperlipidemia, unspecified: Secondary | ICD-10-CM | POA: Insufficient documentation

## 2012-09-12 DIAGNOSIS — F17200 Nicotine dependence, unspecified, uncomplicated: Secondary | ICD-10-CM | POA: Insufficient documentation

## 2012-09-12 DIAGNOSIS — R1110 Vomiting, unspecified: Secondary | ICD-10-CM

## 2012-09-12 DIAGNOSIS — I10 Essential (primary) hypertension: Secondary | ICD-10-CM | POA: Insufficient documentation

## 2012-09-12 DIAGNOSIS — R5381 Other malaise: Secondary | ICD-10-CM | POA: Insufficient documentation

## 2012-09-12 DIAGNOSIS — F3289 Other specified depressive episodes: Secondary | ICD-10-CM | POA: Insufficient documentation

## 2012-09-12 DIAGNOSIS — R112 Nausea with vomiting, unspecified: Secondary | ICD-10-CM | POA: Insufficient documentation

## 2012-09-12 DIAGNOSIS — Z9889 Other specified postprocedural states: Secondary | ICD-10-CM | POA: Insufficient documentation

## 2012-09-12 DIAGNOSIS — G8929 Other chronic pain: Secondary | ICD-10-CM | POA: Insufficient documentation

## 2012-09-12 DIAGNOSIS — Z79899 Other long term (current) drug therapy: Secondary | ICD-10-CM | POA: Insufficient documentation

## 2012-09-12 DIAGNOSIS — N390 Urinary tract infection, site not specified: Secondary | ICD-10-CM | POA: Insufficient documentation

## 2012-09-12 DIAGNOSIS — Z8719 Personal history of other diseases of the digestive system: Secondary | ICD-10-CM | POA: Insufficient documentation

## 2012-09-12 DIAGNOSIS — F411 Generalized anxiety disorder: Secondary | ICD-10-CM | POA: Insufficient documentation

## 2012-09-12 DIAGNOSIS — Z8742 Personal history of other diseases of the female genital tract: Secondary | ICD-10-CM | POA: Insufficient documentation

## 2012-09-12 DIAGNOSIS — Z8673 Personal history of transient ischemic attack (TIA), and cerebral infarction without residual deficits: Secondary | ICD-10-CM | POA: Insufficient documentation

## 2012-09-12 DIAGNOSIS — F329 Major depressive disorder, single episode, unspecified: Secondary | ICD-10-CM | POA: Insufficient documentation

## 2012-09-12 DIAGNOSIS — Z7982 Long term (current) use of aspirin: Secondary | ICD-10-CM | POA: Insufficient documentation

## 2012-09-12 DIAGNOSIS — R197 Diarrhea, unspecified: Secondary | ICD-10-CM | POA: Insufficient documentation

## 2012-09-12 LAB — URINALYSIS, ROUTINE W REFLEX MICROSCOPIC
Bilirubin Urine: NEGATIVE
Glucose, UA: NEGATIVE mg/dL
Ketones, ur: 15 mg/dL — AB
Leukocytes, UA: NEGATIVE
Protein, ur: NEGATIVE mg/dL
pH: 6 (ref 5.0–8.0)

## 2012-09-12 LAB — CBC WITH DIFFERENTIAL/PLATELET
Basophils Absolute: 0 10*3/uL (ref 0.0–0.1)
Eosinophils Absolute: 0.1 10*3/uL (ref 0.0–0.7)
Eosinophils Relative: 2 % (ref 0–5)
HCT: 40.4 % (ref 36.0–46.0)
Lymphocytes Relative: 34 % (ref 12–46)
Lymphs Abs: 3.1 10*3/uL (ref 0.7–4.0)
MCH: 34 pg (ref 26.0–34.0)
MCV: 95.3 fL (ref 78.0–100.0)
Monocytes Absolute: 0.7 10*3/uL (ref 0.1–1.0)
Platelets: 249 10*3/uL (ref 150–400)
RDW: 14.1 % (ref 11.5–15.5)

## 2012-09-12 LAB — COMPREHENSIVE METABOLIC PANEL
ALT: 9 U/L (ref 0–35)
AST: 20 U/L (ref 0–37)
Albumin: 4.5 g/dL (ref 3.5–5.2)
Alkaline Phosphatase: 86 U/L (ref 39–117)
Chloride: 106 mEq/L (ref 96–112)
Potassium: 3.3 mEq/L — ABNORMAL LOW (ref 3.5–5.1)
Sodium: 141 mEq/L (ref 135–145)
Total Bilirubin: 0.3 mg/dL (ref 0.3–1.2)
Total Protein: 8.6 g/dL — ABNORMAL HIGH (ref 6.0–8.3)

## 2012-09-12 LAB — URINE MICROSCOPIC-ADD ON

## 2012-09-12 MED ORDER — KETOROLAC TROMETHAMINE 15 MG/ML IJ SOLN: 15.0000 mg | Freq: Once | INTRAMUSCULAR | Status: DC

## 2012-09-12 MED ORDER — DEXTROSE 5 % IV SOLN
1.0000 g | Freq: Once | INTRAVENOUS | Status: AC
  Administered 2012-09-12: 1 g via INTRAVENOUS
  Filled 2012-09-12: qty 10

## 2012-09-12 MED ORDER — CEFPODOXIME PROXETIL 200 MG PO TABS: 200.0000 mg | ORAL_TABLET | Freq: Two times a day (BID) | ORAL | Status: DC

## 2012-09-12 MED ORDER — ONDANSETRON HCL 4 MG/2ML IJ SOLN
4.0000 mg | Freq: Once | INTRAMUSCULAR | Status: AC
  Administered 2012-09-12: 4 mg via INTRAVENOUS
  Filled 2012-09-12: qty 2

## 2012-09-12 MED ORDER — OXYCODONE-ACETAMINOPHEN 5-325 MG PO TABS: 2.0000 | ORAL_TABLET | ORAL | Status: DC | PRN

## 2012-09-12 MED ORDER — MORPHINE SULFATE 4 MG/ML IJ SOLN
4.0000 mg | Freq: Once | INTRAMUSCULAR | Status: AC
  Administered 2012-09-12: 4 mg via INTRAVENOUS
  Filled 2012-09-12: qty 1

## 2012-09-12 MED ORDER — KETOROLAC TROMETHAMINE 30 MG/ML IJ SOLN
INTRAMUSCULAR | Status: AC
  Administered 2012-09-12: 30 mg
  Filled 2012-09-12: qty 1

## 2012-09-12 MED ORDER — SODIUM CHLORIDE 0.9 % IV BOLUS (SEPSIS)
1000.0000 mL | Freq: Once | INTRAVENOUS | Status: AC
  Administered 2012-09-12: 1000 mL via INTRAVENOUS

## 2012-09-12 MED ORDER — ONDANSETRON HCL 4 MG PO TABS: 4.0000 mg | ORAL_TABLET | Freq: Four times a day (QID) | ORAL | Status: DC

## 2012-09-12 NOTE — ED Notes (Signed)
Pt is here with lower abdominal pain since Friday with associated symptoms of vomiting and diarrhea

## 2012-09-12 NOTE — ED Notes (Signed)
Patient stated she started with right lower back pain 2 days ago.  First she thought it was her E coli flaring up again but the urine shows different.
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
## 2012-09-14 NOTE — ED Provider Notes (Signed)
I saw and evaluated the patient, reviewed the resident's note and I agree with the findings and plan.   Rocephin in the emergency department.  Suspect early pyelonephritis.  Urine culture sent.  She understands return to ER for new or worsening symptoms  Lyanne Co, MD 09/14/12 [phone]

## 2012-10-01 ENCOUNTER — Encounter (HOSPITAL_COMMUNITY): Payer: Self-pay | Admitting: *Deleted

## 2012-10-01 ENCOUNTER — Emergency Department (HOSPITAL_COMMUNITY)
Admission: EM | Admit: 2012-10-01 | Discharge: 2012-10-01 | Disposition: A | Discharge status: Home / Self Care | Payer: Self-pay | Attending: Emergency Medicine | Admitting: Emergency Medicine

## 2012-10-01 DIAGNOSIS — Z8719 Personal history of other diseases of the digestive system: Secondary | ICD-10-CM | POA: Insufficient documentation

## 2012-10-01 DIAGNOSIS — R5381 Other malaise: Secondary | ICD-10-CM | POA: Insufficient documentation

## 2012-10-01 DIAGNOSIS — I2510 Atherosclerotic heart disease of native coronary artery without angina pectoris: Secondary | ICD-10-CM | POA: Insufficient documentation

## 2012-10-01 DIAGNOSIS — Z7982 Long term (current) use of aspirin: Secondary | ICD-10-CM | POA: Insufficient documentation

## 2012-10-01 DIAGNOSIS — E785 Hyperlipidemia, unspecified: Secondary | ICD-10-CM | POA: Insufficient documentation

## 2012-10-01 DIAGNOSIS — F17200 Nicotine dependence, unspecified, uncomplicated: Secondary | ICD-10-CM | POA: Insufficient documentation

## 2012-10-01 DIAGNOSIS — Z79899 Other long term (current) drug therapy: Secondary | ICD-10-CM | POA: Insufficient documentation

## 2012-10-01 DIAGNOSIS — Z8742 Personal history of other diseases of the female genital tract: Secondary | ICD-10-CM | POA: Insufficient documentation

## 2012-10-01 DIAGNOSIS — G8929 Other chronic pain: Secondary | ICD-10-CM | POA: Insufficient documentation

## 2012-10-01 DIAGNOSIS — Z792 Long term (current) use of antibiotics: Secondary | ICD-10-CM | POA: Insufficient documentation

## 2012-10-01 DIAGNOSIS — Z87448 Personal history of other diseases of urinary system: Secondary | ICD-10-CM | POA: Insufficient documentation

## 2012-10-01 DIAGNOSIS — R5383 Other fatigue: Secondary | ICD-10-CM | POA: Insufficient documentation

## 2012-10-01 DIAGNOSIS — I252 Old myocardial infarction: Secondary | ICD-10-CM | POA: Insufficient documentation

## 2012-10-01 DIAGNOSIS — F411 Generalized anxiety disorder: Secondary | ICD-10-CM | POA: Insufficient documentation

## 2012-10-01 DIAGNOSIS — N12 Tubulo-interstitial nephritis, not specified as acute or chronic: Secondary | ICD-10-CM | POA: Insufficient documentation

## 2012-10-01 DIAGNOSIS — F3289 Other specified depressive episodes: Secondary | ICD-10-CM | POA: Insufficient documentation

## 2012-10-01 DIAGNOSIS — F329 Major depressive disorder, single episode, unspecified: Secondary | ICD-10-CM | POA: Insufficient documentation

## 2012-10-01 DIAGNOSIS — I10 Essential (primary) hypertension: Secondary | ICD-10-CM | POA: Insufficient documentation

## 2012-10-01 DIAGNOSIS — Z3202 Encounter for pregnancy test, result negative: Secondary | ICD-10-CM | POA: Insufficient documentation

## 2012-10-01 DIAGNOSIS — M549 Dorsalgia, unspecified: Secondary | ICD-10-CM | POA: Insufficient documentation

## 2012-10-01 DIAGNOSIS — Z8673 Personal history of transient ischemic attack (TIA), and cerebral infarction without residual deficits: Secondary | ICD-10-CM | POA: Insufficient documentation

## 2012-10-01 LAB — URINALYSIS, ROUTINE W REFLEX MICROSCOPIC
Nitrite: POSITIVE — AB
Specific Gravity, Urine: 1.021 (ref 1.005–1.030)
Urobilinogen, UA: 0.2 mg/dL (ref 0.0–1.0)
pH: 6.5 (ref 5.0–8.0)

## 2012-10-01 LAB — CBC WITH DIFFERENTIAL/PLATELET
Eosinophils Absolute: 0.3 10*3/uL (ref 0.0–0.7)
Hemoglobin: 13.5 g/dL (ref 12.0–15.0)
Lymphocytes Relative: 29 % (ref 12–46)
Lymphs Abs: 2.1 10*3/uL (ref 0.7–4.0)
Monocytes Relative: 5 % (ref 3–12)
Neutro Abs: 4.7 10*3/uL (ref 1.7–7.7)
Neutrophils Relative %: 62 % (ref 43–77)
Platelets: 223 10*3/uL (ref 150–400)
RBC: 4.06 MIL/uL (ref 3.87–5.11)
WBC: 7.5 10*3/uL (ref 4.0–10.5)

## 2012-10-01 LAB — URINE MICROSCOPIC-ADD ON

## 2012-10-01 LAB — COMPREHENSIVE METABOLIC PANEL
ALT: 7 U/L (ref 0–35)
Alkaline Phosphatase: 74 U/L (ref 39–117)
CO2: 22 mEq/L (ref 19–32)
Chloride: 109 mEq/L (ref 96–112)
GFR calc Af Amer: >90 mL/min (ref >90)
Glucose, Bld: 94 mg/dL (ref 70–99)
Potassium: 4.3 mEq/L (ref 3.5–5.1)
Sodium: 142 mEq/L (ref 135–145)
Total Bilirubin: 0.2 mg/dL — ABNORMAL LOW (ref 0.3–1.2)
Total Protein: 7.2 g/dL (ref 6.0–8.3)

## 2012-10-01 MED ORDER — MORPHINE SULFATE 4 MG/ML IJ SOLN
4.0000 mg | Freq: Once | INTRAMUSCULAR | Status: AC
  Administered 2012-10-01: 4 mg via INTRAVENOUS
  Filled 2012-10-01: qty 1

## 2012-10-01 MED ORDER — SODIUM CHLORIDE 0.9 % IV BOLUS (SEPSIS)
1000.0000 mL | Freq: Once | INTRAVENOUS | Status: AC
  Administered 2012-10-01: 1000 mL via INTRAVENOUS

## 2012-10-01 MED ORDER — OXYCODONE-ACETAMINOPHEN 5-325 MG PO TABS: 2.0000 | ORAL_TABLET | Freq: Four times a day (QID) | ORAL | Status: DC | PRN

## 2012-10-01 MED ORDER — ONDANSETRON HCL 4 MG/2ML IJ SOLN
4.0000 mg | Freq: Once | INTRAMUSCULAR | Status: AC
  Administered 2012-10-01: 4 mg via INTRAVENOUS
  Filled 2012-10-01: qty 2

## 2012-10-01 MED ORDER — CEPHALEXIN 500 MG PO CAPS: 500.0000 mg | ORAL_CAPSULE | Freq: Four times a day (QID) | ORAL | Status: DC

## 2012-10-01 MED ORDER — ONDANSETRON HCL 4 MG PO TABS: 4.0000 mg | ORAL_TABLET | Freq: Four times a day (QID) | ORAL | Status: DC

## 2012-10-01 MED ORDER — DEXTROSE 5 % IV SOLN
1.0000 g | Freq: Once | INTRAVENOUS | Status: AC
  Administered 2012-10-01: 1 g via INTRAVENOUS
  Filled 2012-10-01: qty 10

## 2012-10-01 NOTE — ED Notes (Signed)
Pt given sprite, no N/V at this time. Will re-evaluate in 15 minutes.

## 2012-10-01 NOTE — ED Notes (Addendum)
Pt ambulated to restroom, with slow, steady gait. No acute distress noted.
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
## 2012-10-01 NOTE — ED Notes (Signed)
Pt able to tolerate sprite with no nausea or vomiting.

## 2012-10-01 NOTE — ED Notes (Signed)
Pt reports having kidney infection that hasnt gotten any better. Having bilateral flank pain and generalized fatigue, having nausea, denies any vomiting or difficulty urinating.

## 2012-10-01 NOTE — ED Provider Notes (Signed)
Medical screening examination/treatment/procedure(s) were performed by non-physician practitioner and as supervising physician I was immediately available for consultation/collaboration.   Richardean Canal, MD 10/01/12 [phone]

## 2012-10-03 LAB — URINE CULTURE: Colony Count: >=100000

## 2012-10-09 ENCOUNTER — Emergency Department (HOSPITAL_COMMUNITY): Payer: Self-pay

## 2012-10-09 ENCOUNTER — Encounter (HOSPITAL_COMMUNITY): Payer: Self-pay | Admitting: *Deleted

## 2012-10-09 ENCOUNTER — Emergency Department (HOSPITAL_COMMUNITY)
Admission: EM | Admit: 2012-10-09 | Discharge: 2012-10-09 | Disposition: A | Discharge status: Home / Self Care | Payer: Self-pay | Attending: Emergency Medicine | Admitting: Emergency Medicine

## 2012-10-09 DIAGNOSIS — N390 Urinary tract infection, site not specified: Secondary | ICD-10-CM | POA: Insufficient documentation

## 2012-10-09 DIAGNOSIS — Z951 Presence of aortocoronary bypass graft: Secondary | ICD-10-CM | POA: Insufficient documentation

## 2012-10-09 DIAGNOSIS — E785 Hyperlipidemia, unspecified: Secondary | ICD-10-CM | POA: Insufficient documentation

## 2012-10-09 DIAGNOSIS — Z8719 Personal history of other diseases of the digestive system: Secondary | ICD-10-CM | POA: Insufficient documentation

## 2012-10-09 DIAGNOSIS — I10 Essential (primary) hypertension: Secondary | ICD-10-CM | POA: Insufficient documentation

## 2012-10-09 DIAGNOSIS — F3289 Other specified depressive episodes: Secondary | ICD-10-CM | POA: Insufficient documentation

## 2012-10-09 DIAGNOSIS — Z87448 Personal history of other diseases of urinary system: Secondary | ICD-10-CM | POA: Insufficient documentation

## 2012-10-09 DIAGNOSIS — Z8679 Personal history of other diseases of the circulatory system: Secondary | ICD-10-CM | POA: Insufficient documentation

## 2012-10-09 DIAGNOSIS — F329 Major depressive disorder, single episode, unspecified: Secondary | ICD-10-CM | POA: Insufficient documentation

## 2012-10-09 DIAGNOSIS — I2510 Atherosclerotic heart disease of native coronary artery without angina pectoris: Secondary | ICD-10-CM | POA: Insufficient documentation

## 2012-10-09 DIAGNOSIS — M549 Dorsalgia, unspecified: Secondary | ICD-10-CM | POA: Insufficient documentation

## 2012-10-09 DIAGNOSIS — G8929 Other chronic pain: Secondary | ICD-10-CM | POA: Insufficient documentation

## 2012-10-09 DIAGNOSIS — Z8742 Personal history of other diseases of the female genital tract: Secondary | ICD-10-CM | POA: Insufficient documentation

## 2012-10-09 DIAGNOSIS — F411 Generalized anxiety disorder: Secondary | ICD-10-CM | POA: Insufficient documentation

## 2012-10-09 DIAGNOSIS — R110 Nausea: Secondary | ICD-10-CM | POA: Insufficient documentation

## 2012-10-09 DIAGNOSIS — Z7982 Long term (current) use of aspirin: Secondary | ICD-10-CM | POA: Insufficient documentation

## 2012-10-09 DIAGNOSIS — Z79899 Other long term (current) drug therapy: Secondary | ICD-10-CM | POA: Insufficient documentation

## 2012-10-09 DIAGNOSIS — R109 Unspecified abdominal pain: Secondary | ICD-10-CM | POA: Insufficient documentation

## 2012-10-09 DIAGNOSIS — I252 Old myocardial infarction: Secondary | ICD-10-CM | POA: Insufficient documentation

## 2012-10-09 DIAGNOSIS — IMO0002 Reserved for concepts with insufficient information to code with codable children: Secondary | ICD-10-CM | POA: Insufficient documentation

## 2012-10-09 DIAGNOSIS — F17200 Nicotine dependence, unspecified, uncomplicated: Secondary | ICD-10-CM | POA: Insufficient documentation

## 2012-10-09 DIAGNOSIS — Z8673 Personal history of transient ischemic attack (TIA), and cerebral infarction without residual deficits: Secondary | ICD-10-CM | POA: Insufficient documentation

## 2012-10-09 LAB — POCT I-STAT, CHEM 8
BUN: 13 mg/dL (ref 6–23)
Calcium, Ion: 1.18 mmol/L (ref 1.12–1.23)
Creatinine, Ser: 0.6 mg/dL (ref 0.50–1.10)
Glucose, Bld: 103 mg/dL — ABNORMAL HIGH (ref 70–99)
TCO2: 24 mmol/L (ref 0–100)

## 2012-10-09 LAB — URINALYSIS, ROUTINE W REFLEX MICROSCOPIC
Glucose, UA: NEGATIVE mg/dL
Ketones, ur: NEGATIVE mg/dL
Protein, ur: NEGATIVE mg/dL
Urobilinogen, UA: 0.2 mg/dL (ref 0.0–1.0)

## 2012-10-09 LAB — CBC
HCT: 39.4 % (ref 36.0–46.0)
Hemoglobin: 13.8 g/dL (ref 12.0–15.0)
MCH: 33.6 pg (ref 26.0–34.0)
MCV: 95.9 fL (ref 78.0–100.0)
RBC: 4.11 MIL/uL (ref 3.87–5.11)

## 2012-10-09 MED ORDER — ONDANSETRON HCL 4 MG PO TABS: 4.0000 mg | ORAL_TABLET | Freq: Four times a day (QID) | ORAL | Status: DC

## 2012-10-09 MED ORDER — OXYCODONE-ACETAMINOPHEN 5-325 MG PO TABS
2.0000 | ORAL_TABLET | Freq: Once | ORAL | Status: AC
  Administered 2012-10-09: 2 via ORAL
  Filled 2012-10-09: qty 2

## 2012-10-09 MED ORDER — ONDANSETRON 4 MG PO TBDP
8.0000 mg | ORAL_TABLET | Freq: Once | ORAL | Status: AC
  Administered 2012-10-09: 8 mg via ORAL
  Filled 2012-10-09: qty 2

## 2012-10-09 MED ORDER — OXYCODONE-ACETAMINOPHEN 5-325 MG PO TABS: 1.0000 | ORAL_TABLET | Freq: Four times a day (QID) | ORAL | Status: DC | PRN

## 2012-10-09 NOTE — ED Notes (Signed)
Pt c/o increased pain, requesting meds. PA informed.

## 2012-10-09 NOTE — ED Provider Notes (Signed)
Medical screening examination/treatment/procedure(s) were performed by non-physician practitioner and as supervising physician I was immediately available for consultation/collaboration.  Jarrel Knoke, MD 10/09/12 2004 
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
## 2012-10-09 NOTE — ED Notes (Addendum)
Pt c/o bilateral flank pain that radiates towards lower abd, sts the right side is a slightly worse than left. sts she was dx with a kidney infection last week and has been taking her Keflex but still hasn't gotten any relief. Pt in nad, skin warm and dry, resp e/u.

## 2012-10-09 NOTE — ED Notes (Signed)
Called pt to room x 2 no response

## 2012-10-09 NOTE — ED Notes (Signed)
Pt reports being here last week and diagnosed with kidney infection. Has been taking keflex for a week with no relief, still having right flank pain.

## 2012-10-15 ENCOUNTER — Telehealth (HOSPITAL_COMMUNITY): Payer: Self-pay | Admitting: *Deleted

## 2012-10-20 ENCOUNTER — Ambulatory Visit: Payer: No Typology Code available for payment source | Attending: Family Medicine | Admitting: Family Medicine

## 2012-10-20 ENCOUNTER — Encounter: Payer: Self-pay | Admitting: Family Medicine

## 2012-10-20 VITALS — BP 178/85 | HR 76 | Temp 98.4°F | Resp 18 | Ht 63.0 in | Wt 124.0 lb

## 2012-10-20 DIAGNOSIS — G894 Chronic pain syndrome: Secondary | ICD-10-CM

## 2012-10-20 DIAGNOSIS — M549 Dorsalgia, unspecified: Secondary | ICD-10-CM | POA: Insufficient documentation

## 2012-10-20 DIAGNOSIS — F1990 Other psychoactive substance use, unspecified, uncomplicated: Secondary | ICD-10-CM | POA: Insufficient documentation

## 2012-10-20 DIAGNOSIS — N390 Urinary tract infection, site not specified: Secondary | ICD-10-CM | POA: Insufficient documentation

## 2012-10-20 LAB — POCT URINALYSIS DIPSTICK
Ketones, UA: NEGATIVE
Protein, UA: NEGATIVE
Spec Grav, UA: 1.025
pH, UA: 5.5

## 2012-10-20 MED ORDER — OMEPRAZOLE 40 MG PO CPDR: 40.0000 mg | DELAYED_RELEASE_CAPSULE | Freq: Every day | ORAL | Status: DC

## 2012-10-20 MED ORDER — HYDROCODONE-ACETAMINOPHEN 5-325 MG PO TABS: 1.0000 | ORAL_TABLET | Freq: Three times a day (TID) | ORAL | Status: DC | PRN

## 2012-10-20 MED ORDER — GABAPENTIN 400 MG PO CAPS: 400.0000 mg | ORAL_CAPSULE | Freq: Three times a day (TID) | ORAL | Status: DC

## 2012-10-20 MED ORDER — AMLODIPINE BESYLATE 10 MG PO TABS: 10.0000 mg | ORAL_TABLET | Freq: Every day | ORAL | Status: DC

## 2012-10-20 MED ORDER — LISINOPRIL 5 MG PO TABS: 5.0000 mg | ORAL_TABLET | Freq: Every day | ORAL | Status: DC

## 2012-10-20 MED ORDER — SIMVASTATIN 20 MG PO TABS: 20.0000 mg | ORAL_TABLET | Freq: Every evening | ORAL | Status: DC

## 2012-10-20 NOTE — Patient Instructions (Addendum)
Back Pain, Adult  Back pain is very common. The pain often gets better over time. The cause of back pain is usually not dangerous. Most people can learn to manage their back pain on their own.   HOME CARE    Stay active. Start with short walks on flat ground if you can. Try to walk farther each day.   Do not sit, drive, or stand in one place for more than 30 minutes. Do not stay in bed.   Do not avoid exercise or work. Activity can help your back heal faster.   Be careful when you bend or lift an object. Bend at your knees, keep the object close to you, and do not twist.   Sleep on a firm mattress. Lie on your side, and bend your knees. If you lie on your back, put a pillow under your knees.   Only take medicines as told by your doctor.   Put ice on the injured area.   Put ice in a plastic bag.   Place a towel between your skin and the bag.   Leave the ice on for 15-20 minutes, 3-4 times a day for the first 2 to 3 days. After that, you can switch between ice and heat packs.   Ask your doctor about back exercises or massage.   Avoid feeling anxious or stressed. Find good ways to deal with stress, such as exercise.  GET HELP RIGHT AWAY IF:    Your pain does not go away with rest or medicine.   Your pain does not go away in 1 week.   You have new problems.   You do not feel well.   The pain spreads into your legs.   You cannot control when you poop (bowel movement) or pee (urinate).   Your arms or legs feel weak or lose feeling (numbness).   You feel sick to your stomach (nauseous) or throw up (vomit).   You have belly (abdominal) pain.   You feel like you may pass out (faint).  MAKE SURE YOU:    Understand these instructions.   Will watch your condition.   Will get help right away if you are not doing well or get worse.  Document Released: 09/16/2007 Document Revised: 06/22/2011 Document Reviewed: 08/18/2010  [employer] Patient Information 2014 [employer]
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
## 2012-10-20 NOTE — Progress Notes (Signed)
Patient presents for hospital follow up for kidney infection and pain. Was at [facility] 2 weeks ago.

## 2012-10-21 IMAGING — CR DG CHEST 2V
2 series · 2 of 2 positions shown · non-contrast
Comparison: Chest 02/05/2010.

CLINICAL DATA: Cough.  Fever.

CHEST - 2 VIEW

[w chest pa]
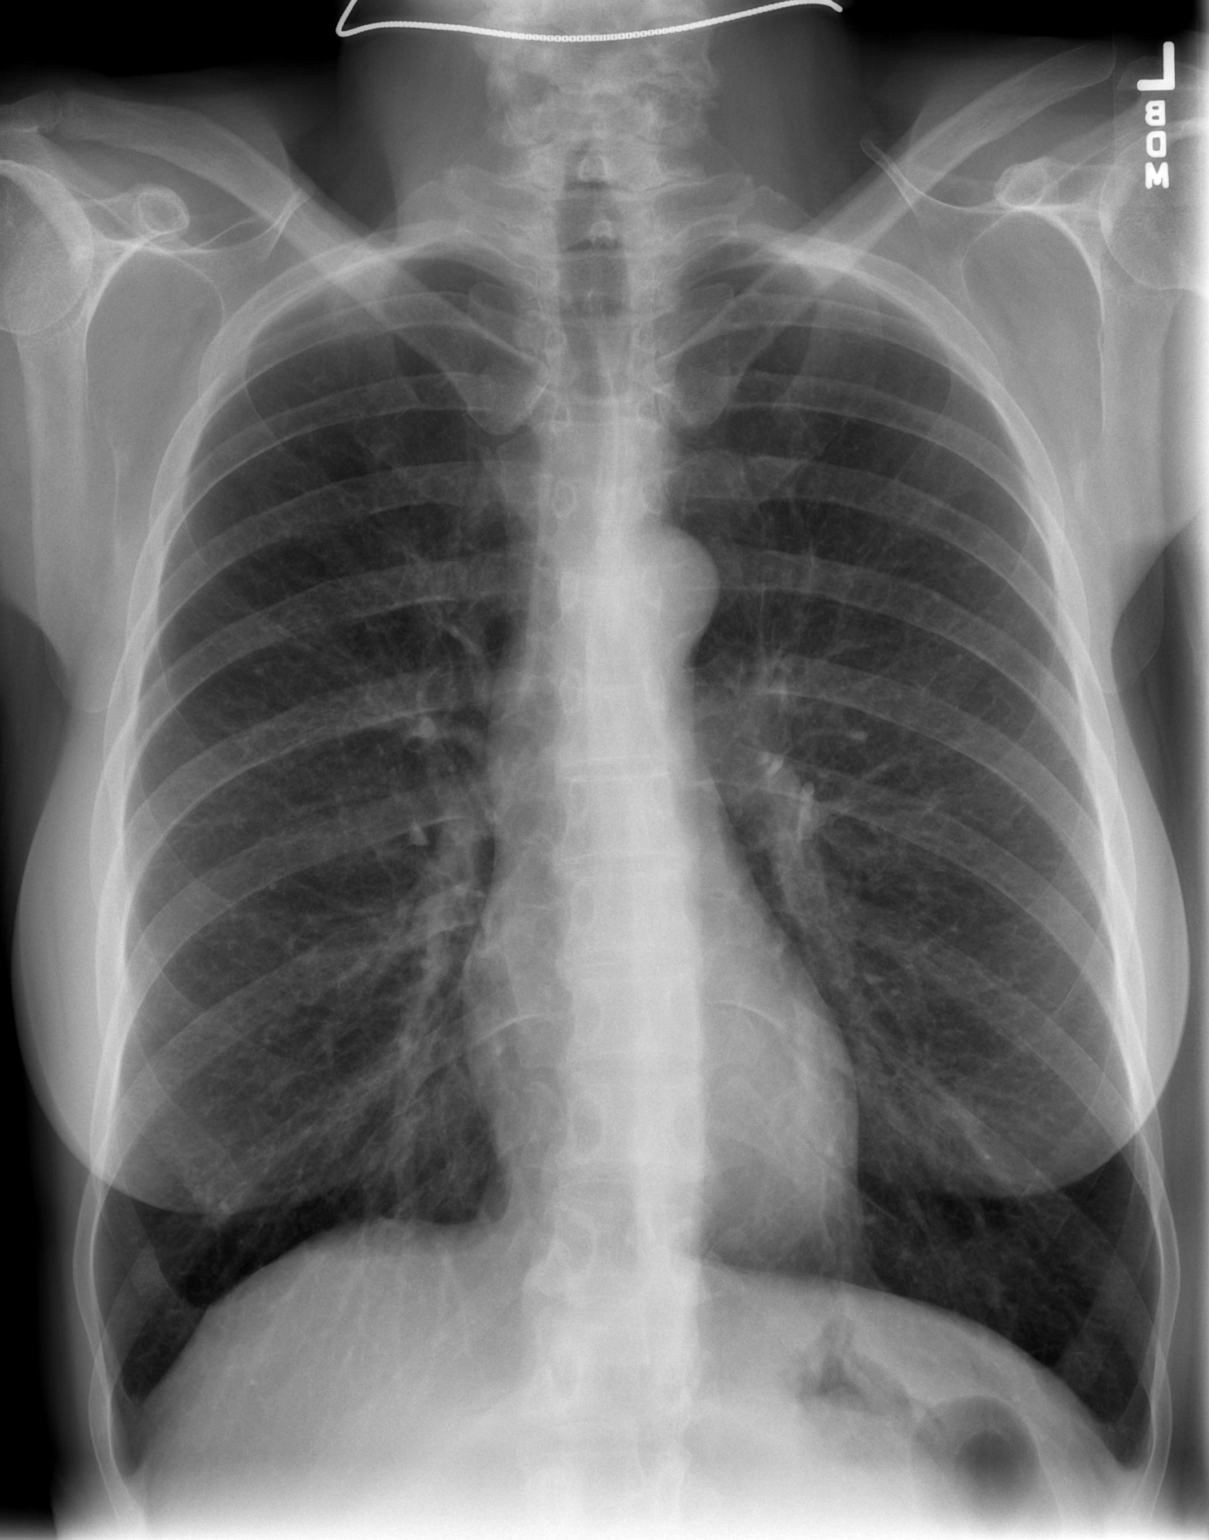

[w chest lat]
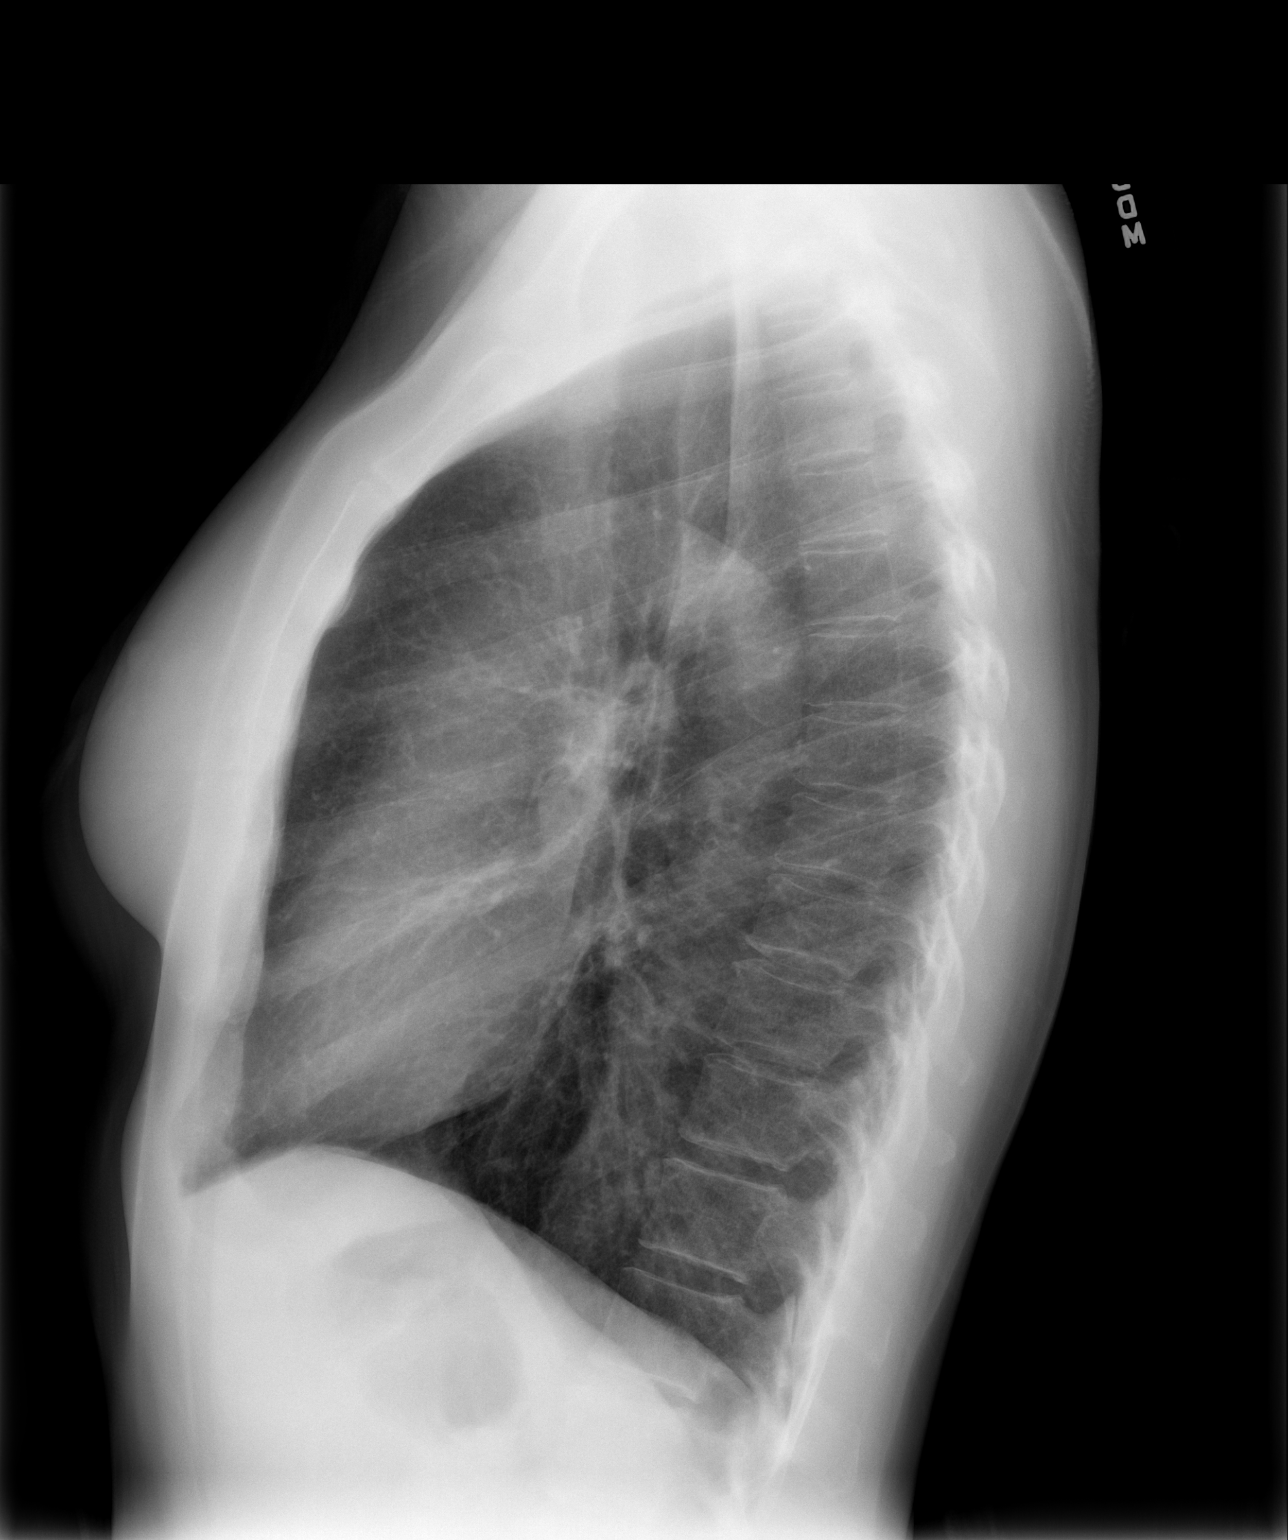

[2 of 2 positions shown; findings below may reference images not displayed]

FINDINGS: Lungs are emphysematous but clear.  No pneumothorax or
pleural effusion.  Heart size normal.  No focal bony abnormality.
IMPRESSION: Emphysema without acute disease.

## 2012-10-25 ENCOUNTER — Emergency Department (HOSPITAL_COMMUNITY)
Admission: EM | Admit: 2012-10-25 | Discharge: 2012-10-25 | Disposition: A | Discharge status: Home / Self Care | Payer: Self-pay | Attending: Emergency Medicine | Admitting: Emergency Medicine

## 2012-10-25 ENCOUNTER — Ambulatory Visit: Payer: No Typology Code available for payment source | Attending: Family Medicine | Admitting: Family Medicine

## 2012-10-25 ENCOUNTER — Encounter (HOSPITAL_COMMUNITY): Payer: Self-pay | Admitting: Family Medicine

## 2012-10-25 ENCOUNTER — Emergency Department (HOSPITAL_COMMUNITY): Payer: Self-pay

## 2012-10-25 VITALS — BP 135/79 | HR 93 | Temp 99.3°F | Resp 14 | Ht 63.0 in | Wt 127.0 lb

## 2012-10-25 DIAGNOSIS — E785 Hyperlipidemia, unspecified: Secondary | ICD-10-CM | POA: Insufficient documentation

## 2012-10-25 DIAGNOSIS — Z8659 Personal history of other mental and behavioral disorders: Secondary | ICD-10-CM | POA: Insufficient documentation

## 2012-10-25 DIAGNOSIS — R300 Dysuria: Secondary | ICD-10-CM

## 2012-10-25 DIAGNOSIS — I252 Old myocardial infarction: Secondary | ICD-10-CM | POA: Insufficient documentation

## 2012-10-25 DIAGNOSIS — N390 Urinary tract infection, site not specified: Secondary | ICD-10-CM

## 2012-10-25 DIAGNOSIS — R110 Nausea: Secondary | ICD-10-CM | POA: Insufficient documentation

## 2012-10-25 DIAGNOSIS — R109 Unspecified abdominal pain: Secondary | ICD-10-CM | POA: Insufficient documentation

## 2012-10-25 DIAGNOSIS — Z8673 Personal history of transient ischemic attack (TIA), and cerebral infarction without residual deficits: Secondary | ICD-10-CM | POA: Insufficient documentation

## 2012-10-25 DIAGNOSIS — R509 Fever, unspecified: Secondary | ICD-10-CM | POA: Insufficient documentation

## 2012-10-25 DIAGNOSIS — Z87448 Personal history of other diseases of urinary system: Secondary | ICD-10-CM | POA: Insufficient documentation

## 2012-10-25 DIAGNOSIS — I2510 Atherosclerotic heart disease of native coronary artery without angina pectoris: Secondary | ICD-10-CM | POA: Insufficient documentation

## 2012-10-25 DIAGNOSIS — F1910 Other psychoactive substance abuse, uncomplicated: Secondary | ICD-10-CM | POA: Insufficient documentation

## 2012-10-25 DIAGNOSIS — F17200 Nicotine dependence, unspecified, uncomplicated: Secondary | ICD-10-CM | POA: Insufficient documentation

## 2012-10-25 DIAGNOSIS — Z8679 Personal history of other diseases of the circulatory system: Secondary | ICD-10-CM | POA: Insufficient documentation

## 2012-10-25 DIAGNOSIS — K589 Irritable bowel syndrome without diarrhea: Secondary | ICD-10-CM | POA: Insufficient documentation

## 2012-10-25 DIAGNOSIS — A498 Other bacterial infections of unspecified site: Secondary | ICD-10-CM | POA: Insufficient documentation

## 2012-10-25 DIAGNOSIS — Z7982 Long term (current) use of aspirin: Secondary | ICD-10-CM | POA: Insufficient documentation

## 2012-10-25 DIAGNOSIS — Z8742 Personal history of other diseases of the female genital tract: Secondary | ICD-10-CM | POA: Insufficient documentation

## 2012-10-25 DIAGNOSIS — Z79899 Other long term (current) drug therapy: Secondary | ICD-10-CM | POA: Insufficient documentation

## 2012-10-25 DIAGNOSIS — I10 Essential (primary) hypertension: Secondary | ICD-10-CM | POA: Insufficient documentation

## 2012-10-25 DIAGNOSIS — R112 Nausea with vomiting, unspecified: Secondary | ICD-10-CM | POA: Insufficient documentation

## 2012-10-25 DIAGNOSIS — M549 Dorsalgia, unspecified: Secondary | ICD-10-CM | POA: Insufficient documentation

## 2012-10-25 LAB — CBC WITH DIFFERENTIAL/PLATELET
Basophils Absolute: 0 10*3/uL (ref 0.0–0.1)
Basophils Relative: 0 % (ref 0–1)
Hemoglobin: 13.4 g/dL (ref 12.0–15.0)
MCHC: 34.9 g/dL (ref 30.0–36.0)
Neutro Abs: 5.9 10*3/uL (ref 1.7–7.7)
Neutrophils Relative %: 52 % (ref 43–77)
Platelets: 216 10*3/uL (ref 150–400)
RDW: 13.5 % (ref 11.5–15.5)

## 2012-10-25 LAB — POCT URINALYSIS DIPSTICK
Leukocytes, UA: NEGATIVE
pH, UA: 5.5

## 2012-10-25 LAB — URINALYSIS, ROUTINE W REFLEX MICROSCOPIC
Ketones, ur: NEGATIVE mg/dL
Leukocytes, UA: NEGATIVE
Nitrite: POSITIVE — AB
Protein, ur: NEGATIVE mg/dL
pH: 5.5 (ref 5.0–8.0)

## 2012-10-25 LAB — BASIC METABOLIC PANEL
CO2: 22 mEq/L (ref 19–32)
Calcium: 9.4 mg/dL (ref 8.4–10.5)
Glucose, Bld: 102 mg/dL — ABNORMAL HIGH (ref 70–99)
Sodium: 137 mEq/L (ref 135–145)

## 2012-10-25 LAB — URINE MICROSCOPIC-ADD ON

## 2012-10-25 MED ORDER — CEPHALEXIN 500 MG PO CAPS: 500.0000 mg | ORAL_CAPSULE | Freq: Four times a day (QID) | ORAL | Status: DC

## 2012-10-25 MED ORDER — SERTRALINE HCL 50 MG PO TABS: 50.0000 mg | ORAL_TABLET | Freq: Every day | ORAL | Status: DC

## 2012-10-25 MED ORDER — SODIUM CHLORIDE 0.9 % IV BOLUS (SEPSIS)
1000.0000 mL | Freq: Once | INTRAVENOUS | Status: AC
  Administered 2012-10-25: 1000 mL via INTRAVENOUS

## 2012-10-25 MED ORDER — PROMETHAZINE HCL 25 MG PO TABS: 25.0000 mg | ORAL_TABLET | Freq: Four times a day (QID) | ORAL | Status: DC | PRN

## 2012-10-25 MED ORDER — DEXTROSE 5 % IV SOLN
1.0000 g | Freq: Once | INTRAVENOUS | Status: AC
  Administered 2012-10-25: 1 g via INTRAVENOUS
  Filled 2012-10-25: qty 10

## 2012-10-25 MED ORDER — FOSFOMYCIN TROMETHAMINE 3 G PO PACK
3.0000 g | PACK | ORAL | Status: AC
  Administered 2012-10-25: 3 g via ORAL
  Filled 2012-10-25: qty 3

## 2012-10-25 MED ORDER — MORPHINE SULFATE 4 MG/ML IJ SOLN
4.0000 mg | Freq: Once | INTRAMUSCULAR | Status: AC
  Administered 2012-10-25: 4 mg via INTRAVENOUS
  Filled 2012-10-25: qty 1

## 2012-10-25 MED ORDER — ONDANSETRON HCL 4 MG/2ML IJ SOLN
4.0000 mg | Freq: Once | INTRAMUSCULAR | Status: AC
  Administered 2012-10-25: 4 mg via INTRAVENOUS
  Filled 2012-10-25: qty 2

## 2012-10-25 MED ORDER — MORPHINE SULFATE 4 MG/ML IJ SOLN: 4.0000 mg | Freq: Once | INTRAMUSCULAR | Status: DC

## 2012-10-25 MED ORDER — OXYCODONE-ACETAMINOPHEN 5-325 MG PO TABS: 1.0000 | ORAL_TABLET | Freq: Four times a day (QID) | ORAL | Status: DC | PRN
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
## 2012-10-25 NOTE — Progress Notes (Signed)
Pt here for f/u blood in urine last week. Also need rx refill Zoloft. C/o right mid back chronic sharp pains radiating to r leg.vss

## 2012-10-25 NOTE — Patient Instructions (Addendum)
Urinary Tract Infection  Urinary tract infections (UTIs) can develop anywhere along your urinary tract. Your urinary tract is your body's drainage system for removing wastes and extra water. Your urinary tract includes two kidneys, two ureters, a bladder, and a urethra. Your kidneys are a pair of bean-shaped organs. Each kidney is about the size of your fist. They are located below your ribs, one on each side of your spine.  CAUSES  Infections are caused by microbes, which are microscopic organisms, including fungi, viruses, and bacteria. These organisms are so small that they can only be seen through a microscope. Bacteria are the microbes that most commonly cause UTIs.  SYMPTOMS   Symptoms of UTIs may vary by age and gender of the patient and by the location of the infection. Symptoms in young women typically include a frequent and intense urge to urinate and a painful, burning feeling in the bladder or urethra during urination. Older women and men are more likely to be tired, shaky, and weak and have muscle aches and abdominal pain. A fever may mean the infection is in your kidneys. Other symptoms of a kidney infection include pain in your back or sides below the ribs, nausea, and vomiting.  DIAGNOSIS  To diagnose a UTI, your caregiver will ask you about your symptoms. Your caregiver also will ask to provide a urine sample. The urine sample will be tested for bacteria and white blood cells. White blood cells are made by your body to help fight infection.  TREATMENT   Typically, UTIs can be treated with medication. Because most UTIs are caused by a bacterial infection, they usually can be treated with the use of antibiotics. The choice of antibiotic and length of treatment depend on your symptoms and the type of bacteria causing your infection.  HOME CARE INSTRUCTIONS   If you were prescribed antibiotics, take them exactly as your caregiver instructs you. Finish the medication even if you feel better after you  have only taken some of the medication.   Drink enough water and fluids to keep your urine clear or pale yellow.   Avoid caffeine, tea, and carbonated beverages. They tend to irritate your bladder.   Empty your bladder often. Avoid holding urine for long periods of time.   Empty your bladder before and after sexual intercourse.   After a bowel movement, women should cleanse from front to back. Use each tissue only once.  SEEK MEDICAL CARE IF:    You have back pain.   You develop a fever.   Your symptoms do not begin to resolve within 3 days.  SEEK IMMEDIATE MEDICAL CARE IF:    You have severe back pain or lower abdominal pain.   You develop chills.   You have nausea or vomiting.   You have continued burning or discomfort with urination.  MAKE SURE YOU:    Understand these instructions.   Will watch your condition.   Will get help right away if you are not doing well or get worse.  Document Released: 01/07/2005 Document Revised: 09/29/2011 Document Reviewed: 05/08/2011  ExitCare Patient Information 2014 [employer].

## 2012-10-25 NOTE — ED Notes (Signed)
Per pt sts she was sent here from family practice with kidney infection. sts she has been taking abx but not better. sts she has been sick and having hematuria.

## 2012-10-25 NOTE — Progress Notes (Signed)
Pt to be transferred to [place] via shuttle for further n/v workup per Dr. Laural Benes. Report given to [facility]
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
## 2012-10-26 LAB — URINE CULTURE: Special Requests: NORMAL

## 2012-10-27 LAB — URINE CULTURE

## 2012-10-27 NOTE — ED Notes (Signed)
+   Urine No further treatment needed per Riki Rusk

## 2012-10-28 NOTE — ED Provider Notes (Signed)
Medical screening examination/treatment/procedure(s) were performed by non-physician practitioner and as supervising physician I was immediately available for consultation/collaboration.  Flint Melter, MD 10/28/12 [phone]

## 2012-11-23 ENCOUNTER — Ambulatory Visit: Payer: Self-pay

## 2012-11-25 ENCOUNTER — Ambulatory Visit (INDEPENDENT_AMBULATORY_CARE_PROVIDER_SITE_OTHER): Payer: No Typology Code available for payment source | Admitting: Internal Medicine

## 2012-11-25 ENCOUNTER — Encounter: Payer: Self-pay | Admitting: Internal Medicine

## 2012-11-25 VITALS — BP 123/75 | HR 87 | Temp 97.5°F | Ht 63.5 in | Wt 130.1 lb

## 2012-11-25 DIAGNOSIS — R911 Solitary pulmonary nodule: Secondary | ICD-10-CM

## 2012-11-25 DIAGNOSIS — R079 Chest pain, unspecified: Secondary | ICD-10-CM

## 2012-11-25 DIAGNOSIS — F419 Anxiety disorder, unspecified: Secondary | ICD-10-CM

## 2012-11-25 DIAGNOSIS — I2510 Atherosclerotic heart disease of native coronary artery without angina pectoris: Secondary | ICD-10-CM

## 2012-11-25 DIAGNOSIS — F3289 Other specified depressive episodes: Secondary | ICD-10-CM

## 2012-11-25 DIAGNOSIS — F329 Major depressive disorder, single episode, unspecified: Secondary | ICD-10-CM

## 2012-11-25 DIAGNOSIS — M549 Dorsalgia, unspecified: Secondary | ICD-10-CM

## 2012-11-25 DIAGNOSIS — R319 Hematuria, unspecified: Secondary | ICD-10-CM

## 2012-11-25 DIAGNOSIS — E785 Hyperlipidemia, unspecified: Secondary | ICD-10-CM

## 2012-11-25 DIAGNOSIS — G8929 Other chronic pain: Secondary | ICD-10-CM

## 2012-11-25 DIAGNOSIS — F319 Bipolar disorder, unspecified: Secondary | ICD-10-CM

## 2012-11-25 DIAGNOSIS — F17200 Nicotine dependence, unspecified, uncomplicated: Secondary | ICD-10-CM

## 2012-11-25 DIAGNOSIS — Z720 Tobacco use: Secondary | ICD-10-CM

## 2012-11-25 DIAGNOSIS — I10 Essential (primary) hypertension: Secondary | ICD-10-CM

## 2012-11-25 DIAGNOSIS — F411 Generalized anxiety disorder: Secondary | ICD-10-CM

## 2012-11-25 DIAGNOSIS — F3110 Bipolar disorder, current episode manic without psychotic features, unspecified: Secondary | ICD-10-CM

## 2012-11-25 DIAGNOSIS — N281 Cyst of kidney, acquired: Secondary | ICD-10-CM | POA: Insufficient documentation

## 2012-11-25 MED ORDER — SIMVASTATIN 20 MG PO TABS: 20.0000 mg | ORAL_TABLET | Freq: Every evening | ORAL | Status: DC

## 2012-11-25 MED ORDER — CYCLOBENZAPRINE HCL 7.5 MG PO TABS: 7.5000 mg | ORAL_TABLET | Freq: Three times a day (TID) | ORAL | Status: DC | PRN

## 2012-11-25 MED ORDER — DIVALPROEX SODIUM 500 MG PO DR TAB: 500.0000 mg | DELAYED_RELEASE_TABLET | Freq: Two times a day (BID) | ORAL | Status: DC

## 2012-11-25 MED ORDER — NITROGLYCERIN 0.4 MG SL SUBL: 0.4000 mg | SUBLINGUAL_TABLET | SUBLINGUAL | Status: DC | PRN

## 2012-11-25 MED ORDER — SERTRALINE HCL 50 MG PO TABS: 75.0000 mg | ORAL_TABLET | Freq: Every day | ORAL | Status: DC

## 2012-11-25 MED ORDER — LISINOPRIL 5 MG PO TABS: 5.0000 mg | ORAL_TABLET | Freq: Every day | ORAL | Status: DC

## 2012-11-25 MED ORDER — MIRTAZAPINE 30 MG PO TBDP: 30.0000 mg | ORAL_TABLET | Freq: Every day | ORAL | Status: DC

## 2012-11-25 NOTE — Assessment & Plan Note (Signed)
Rx flexeril 7.5mg  TID PRN for back pain. Pt informed that opioids will not be prescribed to her.

## 2012-11-25 NOTE — Progress Notes (Signed)
  Subjective:    Patient ID: Brenda [name], female    DOB: February 24, 1961, 52 y.o.   MRN: [id number]  HPI Brenda [name] is a 52 year old woman with PMH of anxiety disorder, bipolar I disorder, chronic back pain, and recent recurrent UTIs who presents for follow up visit. She denies urinary frequency or dysuria but states that she is worried about the kidney cyst independently found in her recent CT abdomen/pelvis. She is also concerned about hematuria in several of her urine samples and would like a referral to Urology. She needs refills in all her medications, the only medications she is taking are Zoloft and Depakote which were prescribed by her providers at [facility]. She states that her pharmacy could not fill prescription from [place].   Review of Systems  Constitutional: Negative for fever, chills, activity change, appetite change and fatigue.  Respiratory: Negative for cough, shortness of breath and wheezing.   Cardiovascular: Negative for chest pain, palpitations and leg swelling.  Gastrointestinal: Negative for nausea, abdominal pain, diarrhea, constipation and blood in stool.  Genitourinary: Positive for flank pain. Negative for dysuria.       Right flank ache that is intermittent   Musculoskeletal: Positive for back pain.       Chronic low back pain  Neurological: Negative for dizziness and light-headedness.  Psychiatric/Behavioral: Negative for agitation. The patient is nervous/anxious.        Objective:   Physical Exam  Nursing note and vitals reviewed. Constitutional: She is oriented to person, place, and time. She appears well-developed and well-nourished. No distress.  Eyes: Conjunctivae are normal. No scleral icterus.  Cardiovascular: Normal rate.   Pulmonary/Chest: Effort normal and breath sounds normal. No respiratory distress. She has no wheezes. She has no rales.  Abdominal: Soft. She exhibits no distension and no mass. There is no tenderness. There is no rebound and no  guarding.  Right flank tenderness that goes away with distracting maneuvers.   Musculoskeletal: She exhibits no edema and no tenderness.  Neurological: She is alert and oriented to person, place, and time.  Skin: Skin is warm and dry. No rash noted. She is not diaphoretic. No erythema. No pallor.  Psychiatric: She has a normal mood and affect.          Assessment & Plan:

## 2012-11-25 NOTE — Assessment & Plan Note (Signed)
Refilled prescriptions given at [facility] per pt's request:  Zoloft 75mg  daily Depakote 500mg  BID

## 2012-11-25 NOTE — Assessment & Plan Note (Signed)
Will discuss this finding with pt during her next visit.

## 2012-11-25 NOTE — Assessment & Plan Note (Addendum)
Will not prescribe Klonopin, pt to follow up with [facility] for this problem.  Rx for Remeron 30mg  qHS for anxiety/insomnia.

## 2012-11-25 NOTE — Assessment & Plan Note (Signed)
  Assessment: Progress toward smoking cessation:  smoking less Barriers to progress toward smoking cessation:    Comments: She is smoking 10 cigarettes per day, trying to cut bacl  Plan: Instruction/counseling given:  I counseled patient on the dangers of tobacco use, advised patient to stop smoking, and reviewed strategies to maximize success. Educational resources provided:  [employer]) brochure Self management tools provided:  smoking cessation plan (STAR Quit Plan) Medications to assist with smoking cessation:  None Patient agreed to the following self-care plans for smoking cessation: go to the [employer] (www.quitlinenc.com);call [employer] (1-800-QUIT-NOW)  Other plans: Follow up in two weeks.

## 2012-11-25 NOTE — Assessment & Plan Note (Addendum)
Rx Zocor 20mg  daily. Repeat lipid panel in 6-8 weeks. May need high dose statin. Continue ASA 81mg  daily.

## 2012-11-25 NOTE — Assessment & Plan Note (Signed)
BP Readings from Last 3 Encounters:  11/25/12 123/75  10/25/12 137/81  10/25/12 135/79    Lab Results  Component Value Date   NA 137 10/25/2012   K 4.6 10/25/2012   CREATININE 0.82 10/25/2012    Assessment: Blood pressure control: controlled Progress toward BP goal:  at goal Comments: Not on medications due to financial hardship and noncompliance.   Plan: Medications:  Will start Lisinopril 5mg  daily given hx of MI Educational resources provided: brochure Self management tools provided:   Other plans: Follow up in two weeks.

## 2012-11-25 NOTE — Assessment & Plan Note (Signed)
Several urinalysis with hematuria but at times when pt had UTI with hemorraghic cystitis as possible explanation for hematuria. Pt declined doing urinalysis today, will check urine for hematuria during her next visit and refer to Urology if appropriate.

## 2012-11-25 NOTE — Patient Instructions (Addendum)
General Instructions: -Call 1-800-QUIT-NOW for free nicotine patches.  -Follow up with [place] in two weeks for your blood pressure.    Treatment Goals:  Goals (1 Years of Data) as of 11/25/12         As of Today 10/25/12 10/25/12 10/25/12 10/25/12     Blood Pressure    . Blood Pressure < 140/90  123/75 137/81 150/72 132/76 137/61      Progress Toward Treatment Goals:  Treatment Goal 11/25/2012  Blood pressure at goal  Stop smoking smoking less  Prevent falls at goal    Self Care Goals & Plans:  Self Care Goal 11/25/2012  Manage my medications take my medicines as prescribed; bring my medications to every visit  Eat healthy foods -  Be physically active take a walk every day  Stop smoking go to the [employer] ([employer]); call [employer] (1-800-QUIT-NOW)       Care Management & Community Referrals:  Referral 11/25/2012  Referrals made for care management support none needed  Referrals made to community resources none

## 2012-11-28 ENCOUNTER — Ambulatory Visit: Payer: Self-pay

## 2012-11-28 NOTE — Progress Notes (Signed)
Case discussed with Dr. Kennerly soon after the resident saw the patient.  We reviewed the resident's history and exam and pertinent patient test results.  I agree with the assessment, diagnosis, and plan of care documented in the resident's note. 

## 2012-12-05 ENCOUNTER — Encounter: Payer: Self-pay | Admitting: Internal Medicine

## 2012-12-05 IMAGING — CR DG RIBS W/ CHEST 3+V*L*
3 series · 3 of 3 positions shown · non-contrast
Comparison: May 26, 2010

CLINICAL DATA: Chest and rib pain

LEFT RIBS AND CHEST - 3+ VIEW

[w chest pa]
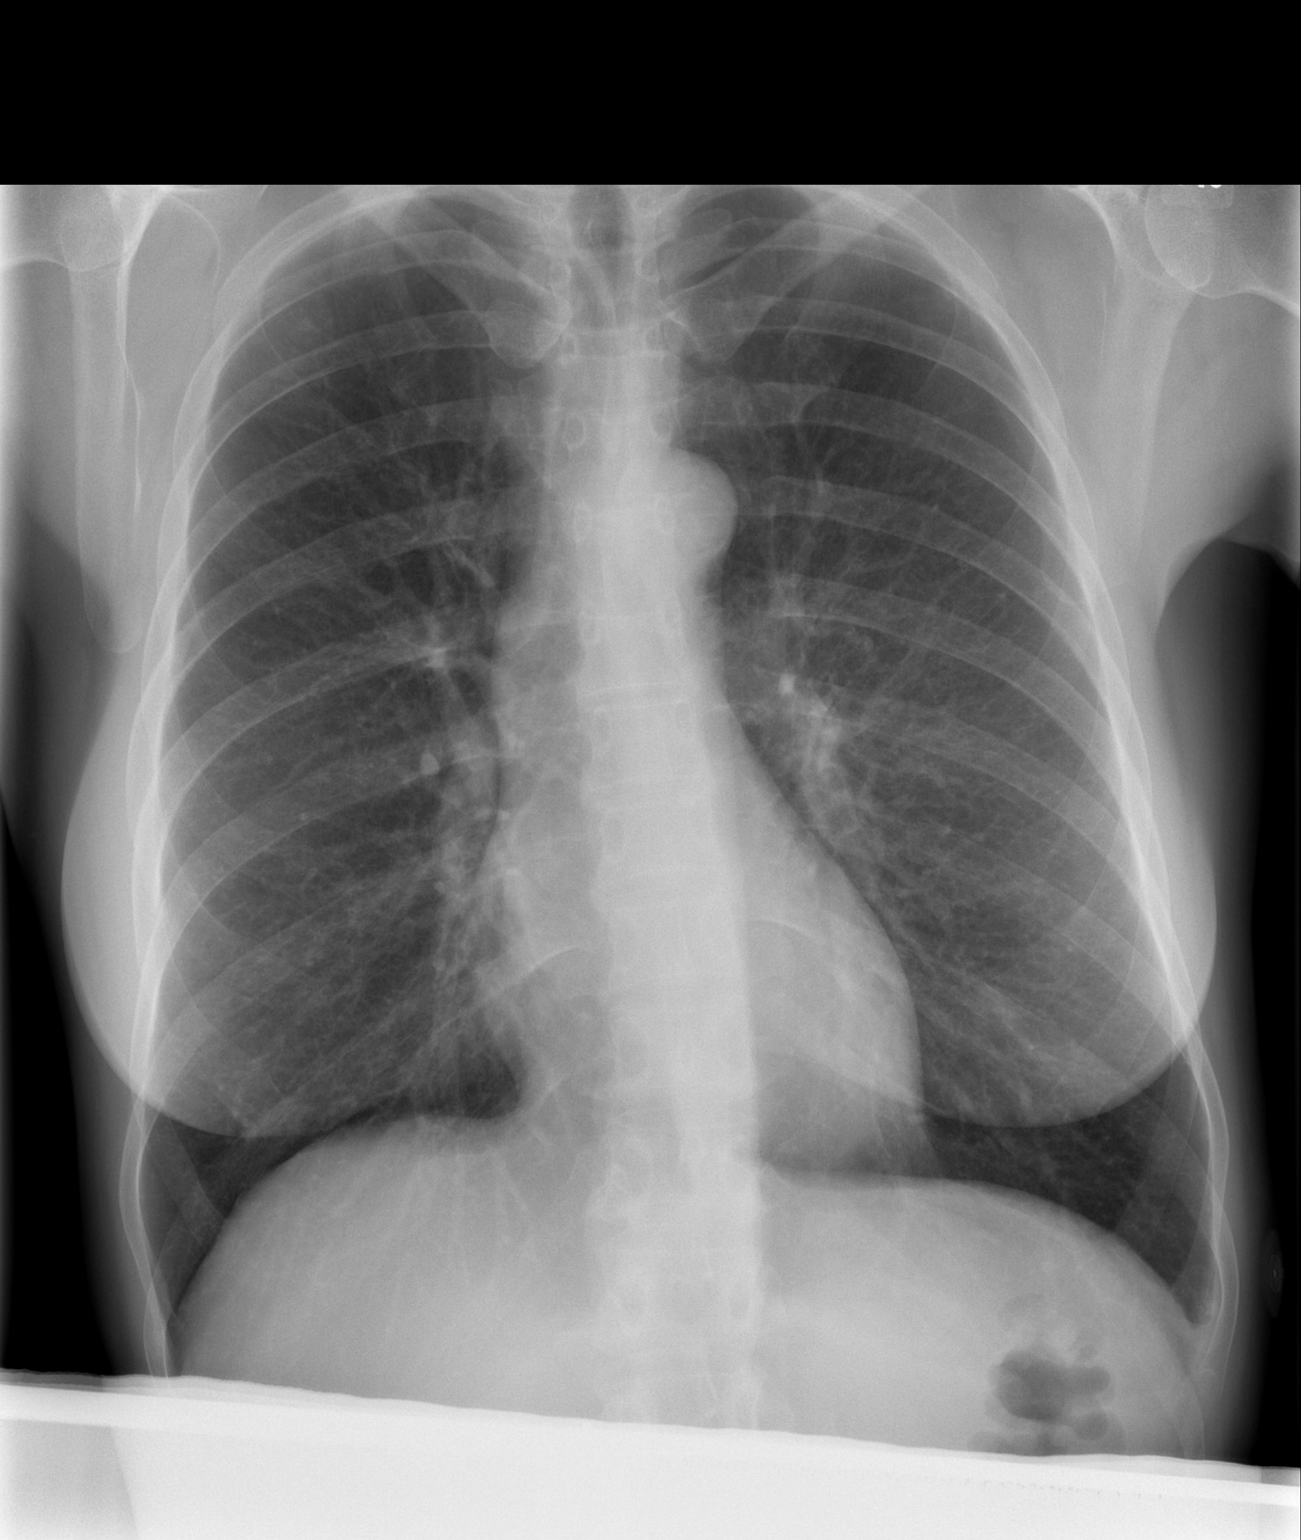

[w ribs ap/pa upper left]
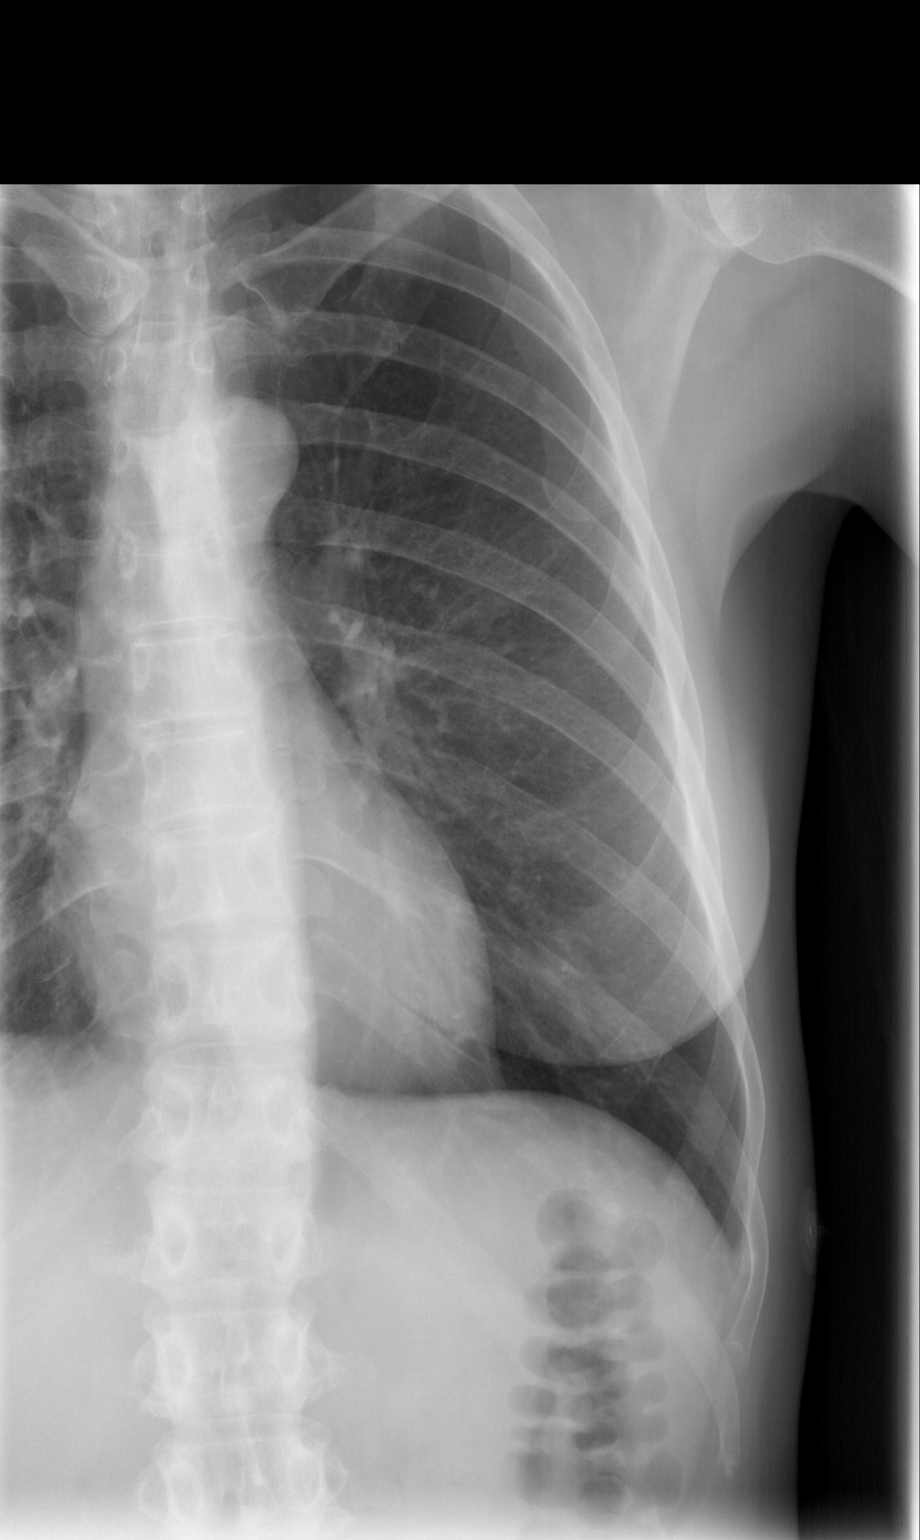

[w ribs ap/pa lower left *]
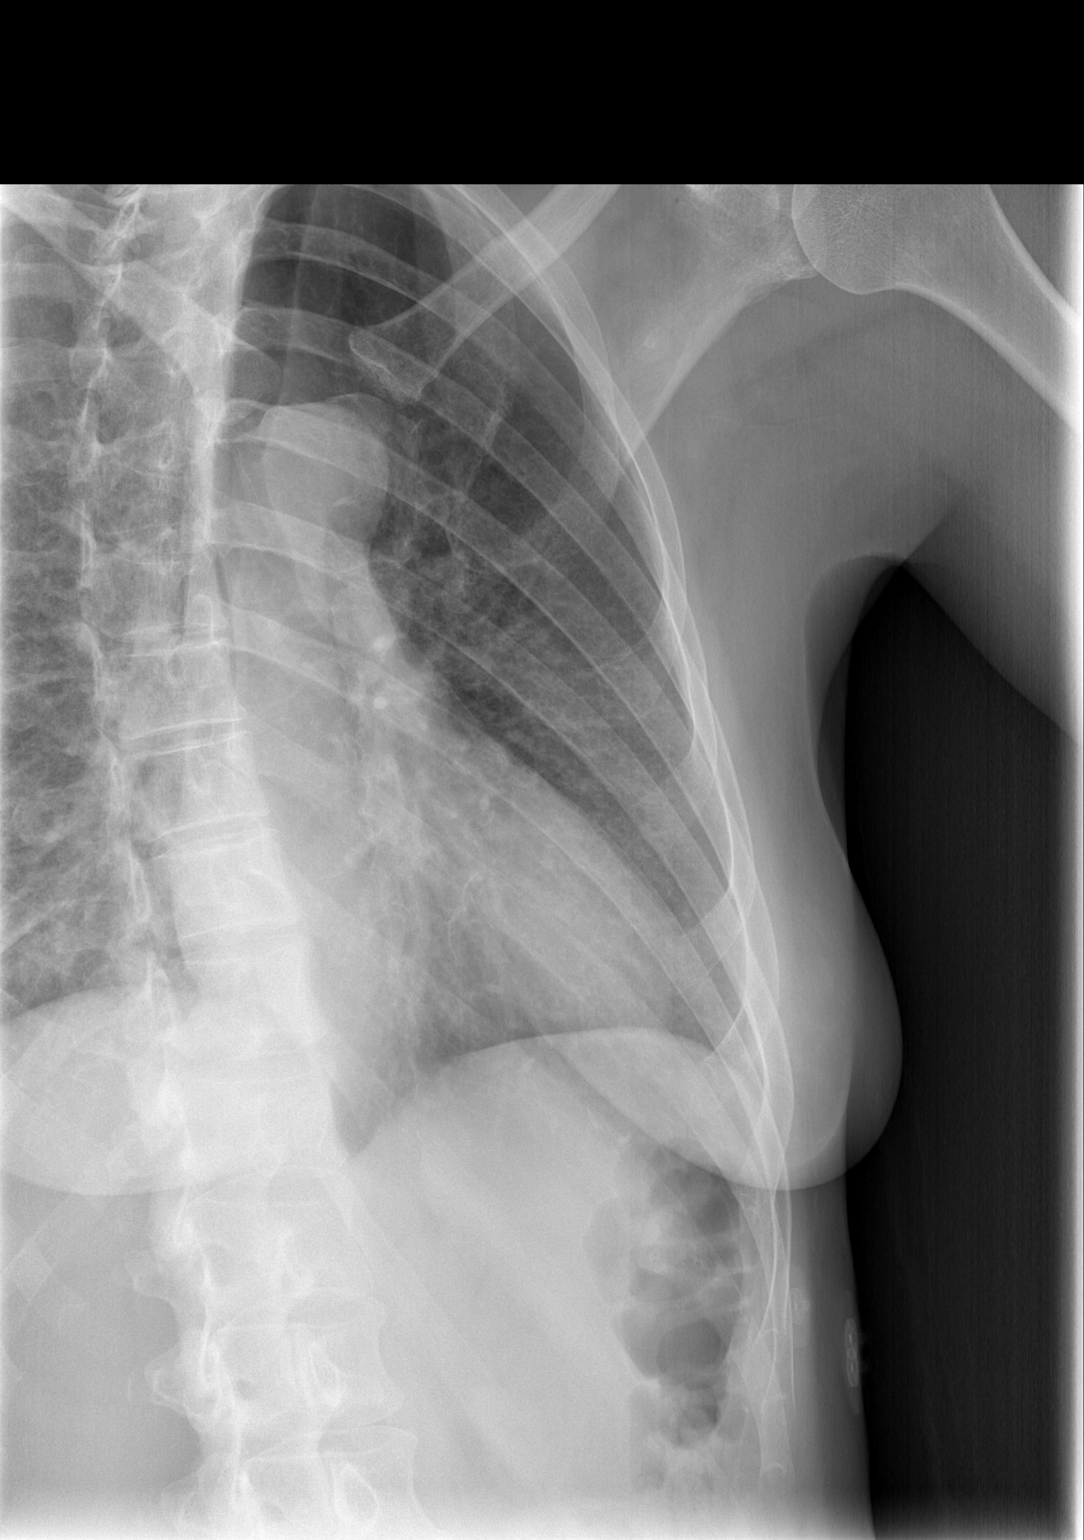

[3 of 3 positions shown; findings below may reference images not displayed]

FINDINGS: The cardiac silhouette, mediastinum, pulmonary
vasculature are within normal limits.  Both lungs are clear.
There is no evidence of rib fracture or pneumothorax.
IMPRESSION: Negative frontal chest and left rib series.

## 2012-12-07 ENCOUNTER — Telehealth: Payer: Self-pay | Admitting: *Deleted

## 2012-12-09 ENCOUNTER — Encounter: Payer: Self-pay | Admitting: Internal Medicine

## 2012-12-09 ENCOUNTER — Ambulatory Visit (INDEPENDENT_AMBULATORY_CARE_PROVIDER_SITE_OTHER): Payer: No Typology Code available for payment source | Admitting: Internal Medicine

## 2012-12-09 VITALS — BP 140/84 | HR 80 | Temp 97.8°F | Ht 63.0 in | Wt 125.9 lb

## 2012-12-09 DIAGNOSIS — I252 Old myocardial infarction: Secondary | ICD-10-CM

## 2012-12-09 DIAGNOSIS — Z79899 Other long term (current) drug therapy: Secondary | ICD-10-CM

## 2012-12-09 DIAGNOSIS — Z23 Encounter for immunization: Secondary | ICD-10-CM

## 2012-12-09 DIAGNOSIS — F3110 Bipolar disorder, current episode manic without psychotic features, unspecified: Secondary | ICD-10-CM

## 2012-12-09 DIAGNOSIS — R319 Hematuria, unspecified: Secondary | ICD-10-CM

## 2012-12-09 DIAGNOSIS — Z1239 Encounter for other screening for malignant neoplasm of breast: Secondary | ICD-10-CM

## 2012-12-09 DIAGNOSIS — E785 Hyperlipidemia, unspecified: Secondary | ICD-10-CM

## 2012-12-09 DIAGNOSIS — R109 Unspecified abdominal pain: Secondary | ICD-10-CM

## 2012-12-09 DIAGNOSIS — Z0000 Encounter for general adult medical examination without abnormal findings: Secondary | ICD-10-CM

## 2012-12-09 DIAGNOSIS — F329 Major depressive disorder, single episode, unspecified: Secondary | ICD-10-CM

## 2012-12-09 DIAGNOSIS — R739 Hyperglycemia, unspecified: Secondary | ICD-10-CM

## 2012-12-09 DIAGNOSIS — Z720 Tobacco use: Secondary | ICD-10-CM

## 2012-12-09 DIAGNOSIS — F319 Bipolar disorder, unspecified: Secondary | ICD-10-CM

## 2012-12-09 DIAGNOSIS — I10 Essential (primary) hypertension: Secondary | ICD-10-CM

## 2012-12-09 DIAGNOSIS — Z8639 Personal history of other endocrine, nutritional and metabolic disease: Secondary | ICD-10-CM

## 2012-12-09 DIAGNOSIS — Z299 Encounter for prophylactic measures, unspecified: Secondary | ICD-10-CM

## 2012-12-09 DIAGNOSIS — I2510 Atherosclerotic heart disease of native coronary artery without angina pectoris: Secondary | ICD-10-CM

## 2012-12-09 DIAGNOSIS — F17200 Nicotine dependence, unspecified, uncomplicated: Secondary | ICD-10-CM

## 2012-12-09 DIAGNOSIS — R911 Solitary pulmonary nodule: Secondary | ICD-10-CM

## 2012-12-09 DIAGNOSIS — Z30431 Encounter for routine checking of intrauterine contraceptive device: Secondary | ICD-10-CM

## 2012-12-09 DIAGNOSIS — G8929 Other chronic pain: Secondary | ICD-10-CM

## 2012-12-09 DIAGNOSIS — M549 Dorsalgia, unspecified: Secondary | ICD-10-CM

## 2012-12-09 LAB — LIPID PANEL
HDL: 45 mg/dL (ref >39)
LDL Cholesterol: 78 mg/dL (ref 0–99)
Triglycerides: 58 mg/dL (ref <150)
VLDL: 12 mg/dL (ref 0–40)

## 2012-12-09 LAB — POCT GLYCOSYLATED HEMOGLOBIN (HGB A1C): Hemoglobin A1C: 5.7

## 2012-12-09 MED ORDER — SERTRALINE HCL 50 MG PO TABS: 75.0000 mg | ORAL_TABLET | Freq: Every day | ORAL | Status: DC

## 2012-12-09 MED ORDER — METOPROLOL SUCCINATE 12.5 MG HALF TABLET: 12.5000 mg | ORAL_TABLET | Freq: Every day | ORAL | Status: DC

## 2012-12-09 MED ORDER — DIVALPROEX SODIUM 500 MG PO DR TAB: 500.0000 mg | DELAYED_RELEASE_TABLET | Freq: Two times a day (BID) | ORAL | Status: DC

## 2012-12-09 MED ORDER — METOPROLOL SUCCINATE 12.5 MG HALF TABLET: 12.5000 mg | ORAL_TABLET | Freq: Two times a day (BID) | ORAL | Status: DC

## 2012-12-09 MED ORDER — MIRTAZAPINE 30 MG PO TBDP: 30.0000 mg | ORAL_TABLET | Freq: Every day | ORAL | Status: DC

## 2012-12-09 MED ORDER — METOPROLOL SUCCINATE 12.5 MG HALF TABLET: 25.0000 mg | ORAL_TABLET | Freq: Every day | ORAL | Status: DC

## 2012-12-09 NOTE — Telephone Encounter (Signed)
review 

## 2012-12-09 NOTE — Patient Instructions (Addendum)
General Instructions: -You have a lung nodule that is stable but needs follow up with a repeat CT Chest in July 2015.  -You have been referred to Gynecology. You need a PAP smear.  -You have been referred to Cardiology.  -You need a mammogram, we will call you with the appointment.  -You need a Dexa scan, we will call you with the appointment.  -Start taking metoprolol 12.5 mg for your blood pressure and for your heart.  -Call 1-800-QUIT-NOW for more information on ides to help you quit smoking. They also have free nicotine patches if you are interested in trying them again.  -Follow up with me in 3 months.    Treatment Goals:  Goals (1 Years of Data) as of 12/09/12         As of Today 11/25/12 10/25/12 10/25/12 10/25/12     Blood Pressure    . Blood Pressure < 140/90  140/84 123/75 137/81 150/72 132/76      Progress Toward Treatment Goals:  Treatment Goal 12/09/2012  Blood pressure improved  Stop smoking smoking less  Prevent falls at goal    Self Care Goals & Plans:  Self Care Goal 12/09/2012  Manage my medications take my medicines as prescribed; bring my medications to every visit; refill my medications on time; follow the sick day instructions if I am sick  Eat healthy foods eat more vegetables; eat fruit for snacks and desserts; eat foods that are low in salt; eat baked foods instead of fried foods; eat smaller portions  Be physically active take a walk every day; find an activity I enjoy  Stop smoking call [employer] (1-800-QUIT-NOW)  Meeting treatment goals maintain the current self-care plan       Care Management & Community Referrals:  Referral 12/09/2012  Referrals made for care management support none needed  Referrals made to community resources -

## 2012-12-10 LAB — URINALYSIS, ROUTINE W REFLEX MICROSCOPIC
Glucose, UA: NEGATIVE mg/dL
Hgb urine dipstick: NEGATIVE
Ketones, ur: NEGATIVE mg/dL
Leukocytes, UA: NEGATIVE
Nitrite: NEGATIVE
Specific Gravity, Urine: 1.017 (ref 1.005–1.030)
Urobilinogen, UA: 1 mg/dL (ref 0.0–1.0)
pH: 6 (ref 5.0–8.0)

## 2012-12-10 MED ORDER — METOPROLOL TARTRATE 25 MG PO TABS: 25.0000 mg | ORAL_TABLET | Freq: Two times a day (BID) | ORAL | Status: DC

## 2012-12-10 MED ORDER — CYCLOBENZAPRINE HCL 5 MG PO TABS: 5.0000 mg | ORAL_TABLET | Freq: Three times a day (TID) | ORAL | Status: DC | PRN

## 2012-12-10 NOTE — Assessment & Plan Note (Signed)
Refilled Depakote 500mg  BID, Zoloft 75mg  daily, Remeron

## 2012-12-10 NOTE — Assessment & Plan Note (Addendum)
She is due for a PAP smear. Pt also needs Mirena IUD removed. Had post-menopausal uterine bleed, pt not sure if she had endometrial biopsy.  -Referral to [facility] -Ordered Dexa scan based on pt's hx of alcohol abuse (pt is <65) -Ordered mammogram -Influenza vaccine given during this visit.  -Pt with hyperglycemia in previous visit, HgA1C is 5.7%, not diabetic

## 2012-12-10 NOTE — Progress Notes (Signed)
  Subjective:    Patient ID: Brenda [name], female    DOB: [birth year], 52 y.o.   MRN: [id number]  HPI Brenda [name] is a 52 year old woman with PMH significant for HTN, Bipolar I disorder, and depression who presents for follow up visit for her blood pressure. She states that she has been taking all her medications, she asks for refills for her mental health medications so she can [facility] them up [facility] after this visit.  She denies gross hematuria, decreased urination, increased urinary frequency. She continues to have intermittent right flank pain and is concerned that this is her kidney cyst. She is very interested in being referred to Urology.    Review of Systems  Constitutional: Negative for fever, chills, diaphoresis, activity change, appetite change and fatigue.  Respiratory: Negative for cough and shortness of breath.   Cardiovascular: Negative for chest pain, palpitations and leg swelling.  Gastrointestinal: Negative for nausea, vomiting, abdominal pain, diarrhea and constipation.  Genitourinary: Negative for dysuria.  Musculoskeletal: Positive for back pain.       Intermittent right flank pain.   Neurological: Negative for dizziness, light-headedness and headaches.  Psychiatric/Behavioral: Negative for behavioral problems, confusion and agitation.       Objective:   Physical Exam  Nursing note and vitals reviewed. Constitutional: She is oriented to person, place, and time. She appears well-developed and well-nourished. No distress.  Eyes: Conjunctivae are normal. No scleral icterus.  Cardiovascular: Normal rate and regular rhythm.   Pulmonary/Chest: Effort normal. No respiratory distress.  Neurological: She is alert and oriented to person, place, and time.  Skin: Skin is warm and dry. She is not diaphoretic.  Psychiatric: She has a normal mood and affect.          Assessment & Plan:

## 2012-12-10 NOTE — Assessment & Plan Note (Signed)
Checked lipid panel during this visit with LDL of 78.  Continue statin, Zocor 20mg  daily.  Start ASA 81mg  daily

## 2012-12-10 NOTE — Assessment & Plan Note (Signed)
  Assessment: Progress toward smoking cessation:  smoking less Barriers to progress toward smoking cessation:  stress Comments: Pt is not interested in quitting at this time. She has tried nicotine patches and gum in the past. Her barrier to quit is stress.   Plan: Instruction/counseling given:  I counseled patient on the dangers of tobacco use, advised patient to stop smoking, and reviewed strategies to maximize success. Educational resources provided:  [employer], [employer]) brochure Self management tools provided:    Medications to assist with smoking cessation:  None Patient agreed to the following self-care plans for smoking cessation: call [facility] (1-800-QUIT-NOW)  Other plans: Continue smoking cessation counseling.

## 2012-12-10 NOTE — Assessment & Plan Note (Addendum)
BP Readings from Last 3 Encounters:  12/09/12 140/84  11/25/12 123/75  10/25/12 137/81    Lab Results  Component Value Date   NA 137 10/25/2012   K 4.6 10/25/2012   CREATININE 0.82 10/25/2012    Assessment: Blood pressure control: controlled Progress toward BP goal:  improved Comments: Pt reports compliance to Lisinopril 5mg  daily, SPB at goal of 140.   Plan: Medications:  continue current medications, Lisinopril 5mg  and add metoprolol 12.5mg  BID due to pt's hx of MI. Pt specifically denies history of cocaine use or currently being around persons who use cocaine. Pt was advised to avoid cocaine while taking this BB. She voiced understanding. Started ASA 81mg  daily as well.  Educational resources provided: Education officer, environmental tools provided:   Other plans: Follow up in 3 months but sooner if she experiences dizziness of fatigue with BB. Pt agrees to this plan.

## 2012-12-11 NOTE — Assessment & Plan Note (Signed)
Discussed findings of CT with pt and that she will need follow up chest CT in July 2015.

## 2012-12-11 NOTE — Assessment & Plan Note (Signed)
Pt has had hematuria in the past with concurrent UTI.  UA during this visit with no hematuria.  Pt is concerned about her left kidney cyst. Will consider referral to Urology.

## 2012-12-16 NOTE — Progress Notes (Signed)
Case discussed with Dr. Kennerly soon after the resident saw the patient.  We reviewed the resident's history and exam and pertinent patient test results.  I agree with the assessment, diagnosis, and plan of care documented in the resident's note. 

## 2012-12-19 ENCOUNTER — Encounter: Payer: Self-pay | Admitting: Obstetrics and Gynecology

## 2012-12-23 ENCOUNTER — Encounter: Payer: Self-pay | Admitting: *Deleted

## 2013-01-05 ENCOUNTER — Emergency Department (HOSPITAL_COMMUNITY)
Admission: EM | Admit: 2013-01-05 | Discharge: 2013-01-05 | Disposition: A | Discharge status: Home / Self Care | Payer: No Typology Code available for payment source | Attending: Emergency Medicine | Admitting: Emergency Medicine

## 2013-01-05 ENCOUNTER — Encounter (HOSPITAL_COMMUNITY): Payer: Self-pay | Admitting: Emergency Medicine

## 2013-01-05 DIAGNOSIS — G8929 Other chronic pain: Secondary | ICD-10-CM | POA: Insufficient documentation

## 2013-01-05 DIAGNOSIS — R630 Anorexia: Secondary | ICD-10-CM | POA: Insufficient documentation

## 2013-01-05 DIAGNOSIS — Z8742 Personal history of other diseases of the female genital tract: Secondary | ICD-10-CM | POA: Insufficient documentation

## 2013-01-05 DIAGNOSIS — I10 Essential (primary) hypertension: Secondary | ICD-10-CM | POA: Insufficient documentation

## 2013-01-05 DIAGNOSIS — F3289 Other specified depressive episodes: Secondary | ICD-10-CM | POA: Insufficient documentation

## 2013-01-05 DIAGNOSIS — Z951 Presence of aortocoronary bypass graft: Secondary | ICD-10-CM | POA: Insufficient documentation

## 2013-01-05 DIAGNOSIS — I2510 Atherosclerotic heart disease of native coronary artery without angina pectoris: Secondary | ICD-10-CM | POA: Insufficient documentation

## 2013-01-05 DIAGNOSIS — Z8673 Personal history of transient ischemic attack (TIA), and cerebral infarction without residual deficits: Secondary | ICD-10-CM | POA: Insufficient documentation

## 2013-01-05 DIAGNOSIS — I252 Old myocardial infarction: Secondary | ICD-10-CM | POA: Insufficient documentation

## 2013-01-05 DIAGNOSIS — M545 Low back pain, unspecified: Secondary | ICD-10-CM | POA: Insufficient documentation

## 2013-01-05 DIAGNOSIS — F411 Generalized anxiety disorder: Secondary | ICD-10-CM | POA: Insufficient documentation

## 2013-01-05 DIAGNOSIS — Z7982 Long term (current) use of aspirin: Secondary | ICD-10-CM | POA: Insufficient documentation

## 2013-01-05 DIAGNOSIS — Z87440 Personal history of urinary (tract) infections: Secondary | ICD-10-CM | POA: Insufficient documentation

## 2013-01-05 DIAGNOSIS — R300 Dysuria: Secondary | ICD-10-CM | POA: Insufficient documentation

## 2013-01-05 DIAGNOSIS — Z8719 Personal history of other diseases of the digestive system: Secondary | ICD-10-CM | POA: Insufficient documentation

## 2013-01-05 DIAGNOSIS — R109 Unspecified abdominal pain: Secondary | ICD-10-CM | POA: Insufficient documentation

## 2013-01-05 DIAGNOSIS — E785 Hyperlipidemia, unspecified: Secondary | ICD-10-CM | POA: Insufficient documentation

## 2013-01-05 DIAGNOSIS — F329 Major depressive disorder, single episode, unspecified: Secondary | ICD-10-CM | POA: Insufficient documentation

## 2013-01-05 DIAGNOSIS — F17200 Nicotine dependence, unspecified, uncomplicated: Secondary | ICD-10-CM | POA: Insufficient documentation

## 2013-01-05 DIAGNOSIS — Z79899 Other long term (current) drug therapy: Secondary | ICD-10-CM | POA: Insufficient documentation

## 2013-01-05 LAB — CBC WITH DIFFERENTIAL/PLATELET
Eosinophils Absolute: 0.2 10*3/uL (ref 0.0–0.7)
Eosinophils Relative: 3 % (ref 0–5)
Hemoglobin: 13.6 g/dL (ref 12.0–15.0)
Lymphocytes Relative: 35 % (ref 12–46)
Lymphs Abs: 2.5 10*3/uL (ref 0.7–4.0)
MCH: 33.8 pg (ref 26.0–34.0)
MCV: 95 fL (ref 78.0–100.0)
Monocytes Relative: 5 % (ref 3–12)
Neutrophils Relative %: 58 % (ref 43–77)
RBC: 4.02 MIL/uL (ref 3.87–5.11)

## 2013-01-05 LAB — URINALYSIS, ROUTINE W REFLEX MICROSCOPIC
Ketones, ur: 15 mg/dL — AB
Leukocytes, UA: NEGATIVE
Nitrite: NEGATIVE
Protein, ur: NEGATIVE mg/dL
Urobilinogen, UA: 1 mg/dL (ref 0.0–1.0)
pH: 6 (ref 5.0–8.0)

## 2013-01-05 LAB — COMPREHENSIVE METABOLIC PANEL
Alkaline Phosphatase: 80 U/L (ref 39–117)
BUN: 17 mg/dL (ref 6–23)
CO2: 24 mEq/L (ref 19–32)
GFR calc Af Amer: >90 mL/min (ref >90)
GFR calc non Af Amer: >90 mL/min (ref >90)
Glucose, Bld: 145 mg/dL — ABNORMAL HIGH (ref 70–99)
Potassium: 4.1 mEq/L (ref 3.5–5.1)
Total Protein: 7.6 g/dL (ref 6.0–8.3)

## 2013-01-05 MED ORDER — TRAMADOL HCL 50 MG PO TABS: 50.0000 mg | ORAL_TABLET | Freq: Four times a day (QID) | ORAL | Status: DC | PRN

## 2013-01-05 MED ORDER — CEPHALEXIN 250 MG PO CAPS: 250.0000 mg | ORAL_CAPSULE | Freq: Four times a day (QID) | ORAL | Status: DC

## 2013-01-05 MED ORDER — CEPHALEXIN 250 MG PO CAPS: 250.0000 mg | ORAL_CAPSULE | Freq: Four times a day (QID) | ORAL | Status: AC
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
## 2013-01-05 NOTE — ED Notes (Signed)
Pt c/o right sided flank pain x 2 days; pt sts hx of same with infection

## 2013-01-05 NOTE — ED Notes (Signed)
Pt reports right back/flank pain x 2 weeks. States that she has a cyst on her right ovary and thinks pain may be from that or a kidney infection. Pt states that her PCP is supposed to refer her to a Urologist, but has not heard back from the office. Pt has a history of chronic pain and was going to a pain clinic but stopped going 9 months ago. Just got her orange card but will take 3 months to get into another pain clinic. Pt states pain is 200/10.

## 2013-01-05 NOTE — ED Provider Notes (Addendum)
I saw and evaluated the patient, reviewed the resident's note and I agree with the findings and plan.  Patient seen by me. Patient with right-sided flank pain for 2 days lab workup here is completely normal. Patient claims this is exactly the way it was before she developed significant urinary tract infection back in May and June. To keep peace with the patient will go ahead and give her a trial course of Keflex. Antibiotics ordered also referral to urology provided. Patient is nontoxic no acute distress. Patient's renal function is normal urinalysis is normal.  Results for orders placed during the hospital encounter of 01/05/13  CBC WITH DIFFERENTIAL      Result Value Range   WBC 7.2  4.0 - 10.5 K/uL   RBC 4.02  3.87 - 5.11 MIL/uL   Hemoglobin 13.6  12.0 - 15.0 g/dL   HCT 19.1  47.8 - 29.5 %   MCV 95.0  78.0 - 100.0 fL   MCH 33.8  26.0 - 34.0 pg   MCHC 35.6  30.0 - 36.0 g/dL   RDW 62.1  30.8 - 65.7 %   Platelets 219  150 - 400 K/uL   Neutrophils Relative % 58  43 - 77 %   Neutro Abs 4.1  1.7 - 7.7 K/uL   Lymphocytes Relative 35  12 - 46 %   Lymphs Abs 2.5  0.7 - 4.0 K/uL   Monocytes Relative 5  3 - 12 %   Monocytes Absolute 0.4  0.1 - 1.0 K/uL   Eosinophils Relative 3  0 - 5 %   Eosinophils Absolute 0.2  0.0 - 0.7 K/uL   Basophils Relative 0  0 - 1 %   Basophils Absolute 0.0  0.0 - 0.1 K/uL  COMPREHENSIVE METABOLIC PANEL      Result Value Range   Sodium 136  135 - 145 mEq/L   Potassium 4.1  3.5 - 5.1 mEq/L   Chloride 101  96 - 112 mEq/L   CO2 24  19 - 32 mEq/L   Glucose, Bld 145 (*) 70 - 99 mg/dL   BUN 17  6 - 23 mg/dL   Creatinine, Ser 8.46  0.50 - 1.10 mg/dL   Calcium 9.2  8.4 - 96.2 mg/dL   Total Protein 7.6  6.0 - 8.3 g/dL   Albumin 4.0  3.5 - 5.2 g/dL   AST 14  0 - 37 U/L   ALT 6  0 - 35 U/L   Alkaline Phosphatase 80  39 - 117 U/L   Total Bilirubin 0.2 (*) 0.3 - 1.2 mg/dL   GFR calc non Af Amer >90  >90 mL/min   GFR calc Af Amer >90  >90 mL/min  URINALYSIS, ROUTINE W  REFLEX MICROSCOPIC      Result Value Range   Color, Urine AMBER (*) YELLOW   APPearance CLEAR  CLEAR   Specific Gravity, Urine 1.033 (*) 1.005 - 1.030   pH 6.0  5.0 - 8.0   Glucose, UA NEGATIVE  NEGATIVE mg/dL   Hgb urine dipstick NEGATIVE  NEGATIVE   Bilirubin Urine SMALL (*) NEGATIVE   Ketones, ur 15 (*) NEGATIVE mg/dL   Protein, ur NEGATIVE  NEGATIVE mg/dL   Urobilinogen, UA 1.0  0.0 - 1.0 mg/dL   Nitrite NEGATIVE  NEGATIVE   Leukocytes, UA NEGATIVE  NEGATIVE       Shelda Jakes, MD 01/05/13 1622  Shelda Jakes, MD 01/05/13 [phone]

## 2013-01-16 ENCOUNTER — Ambulatory Visit: Payer: Self-pay | Admitting: Nurse Practitioner

## 2013-02-06 ENCOUNTER — Encounter: Payer: Self-pay | Admitting: *Deleted

## 2013-02-06 ENCOUNTER — Encounter: Payer: Self-pay | Admitting: Advanced Practice Midwife

## 2013-02-06 ENCOUNTER — Ambulatory Visit (INDEPENDENT_AMBULATORY_CARE_PROVIDER_SITE_OTHER): Payer: No Typology Code available for payment source | Admitting: Advanced Practice Midwife

## 2013-02-06 VITALS — BP 148/85 | HR 88 | Temp 97.6°F | Ht 64.0 in | Wt 133.7 lb

## 2013-02-06 DIAGNOSIS — Z01419 Encounter for gynecological examination (general) (routine) without abnormal findings: Secondary | ICD-10-CM

## 2013-02-06 DIAGNOSIS — Z30432 Encounter for removal of intrauterine contraceptive device: Secondary | ICD-10-CM

## 2013-02-06 DIAGNOSIS — F1910 Other psychoactive substance abuse, uncomplicated: Secondary | ICD-10-CM

## 2013-02-06 DIAGNOSIS — Z3049 Encounter for surveillance of other contraceptives: Secondary | ICD-10-CM

## 2013-02-06 MED ORDER — OXYCODONE-ACETAMINOPHEN 5-325 MG PO TABS: 1.0000 | ORAL_TABLET | ORAL | Status: DC | PRN

## 2013-02-06 MED ORDER — MEDROXYPROGESTERONE ACETATE 104 MG/0.65ML ~~LOC~~ SUSP
104.0000 mg | Freq: Once | SUBCUTANEOUS | Status: AC
  Administered 2013-02-06: 104 mg via SUBCUTANEOUS

## 2013-02-06 MED ORDER — MEDROXYPROGESTERONE ACETATE 150 MG/ML IM SUSP: 150.0000 mg | Freq: Once | INTRAMUSCULAR | Status: DC

## 2013-02-06 NOTE — Progress Notes (Signed)
S/O:   This is a 52 y.o. female who presents for IUD removal.  This was placed by Dr Okey Dupre (Mirena) in 2008, for DUB.  Endometrial biopsy at that time was normal.  Patient states she realizes she is overdue by a year, but it has not bothered her until recently when she has had some cramping with it. Does not want another one right now, but may want another one if she needs it. Is interested in DepoProvera as contraceptive until she is sure she is past menopause.  Has been having hot flashes for several years, but since she had the IUD, was never sure what her menopausal status was. Does have a history of fibroids.   Is over due for a pap smear. Declines STD testing.   Filed Vitals:   02/06/13 1302  BP: 148/85  Pulse: 88  Temp: 97.6 F (36.4 C)   Abdomen soft and moderately tender over suprapubic area. EGBUS:  Normal external genitalia without erethema or lesions. Vagina rugous, without lesions or abnormal discharge Cervix closed and long, IUD string visible at os Uterus small and moderately tender Adnexae nontender, no masses palpable  A/P:  IUD removed atraumatically, but pt c/o significant pain with removal and afterward         Needs coverage for contraception until past menopausal age         Pap done         Discussed options for contraception. Will give DepoProvera today. Warned of possible irregular bleeding for first 3-6 mos. Patient wants to try it. Does not want to bleed.          Per Dr Jolayne Panther, no need for additional endometrial biopsy, since she has had no breakthrough bleeding with Mirena         Return in one year for annual exam         Continue medical care with primary doctor

## 2013-02-06 NOTE — Patient Instructions (Signed)

## 2013-02-06 NOTE — Addendum Note (Signed)
Addended by: Neomia Dear on: 02/06/2013 04:55 PM   Modules accepted: Orders

## 2013-02-07 ENCOUNTER — Encounter: Payer: Self-pay | Admitting: Advanced Practice Midwife

## 2013-02-07 DIAGNOSIS — R87810 Cervical high risk human papillomavirus (HPV) DNA test positive: Secondary | ICD-10-CM | POA: Insufficient documentation

## 2013-02-16 ENCOUNTER — Encounter: Payer: Self-pay | Admitting: Advanced Practice Midwife

## 2013-02-24 ENCOUNTER — Encounter: Payer: Self-pay | Admitting: *Deleted

## 2013-02-25 ENCOUNTER — Emergency Department (HOSPITAL_COMMUNITY)
Admission: EM | Admit: 2013-02-25 | Discharge: 2013-02-25 | Disposition: A | Discharge status: Home / Self Care | Payer: No Typology Code available for payment source | Attending: Emergency Medicine | Admitting: Emergency Medicine

## 2013-02-25 ENCOUNTER — Encounter (HOSPITAL_COMMUNITY): Payer: Self-pay | Admitting: Emergency Medicine

## 2013-02-25 ENCOUNTER — Emergency Department (HOSPITAL_COMMUNITY): Payer: No Typology Code available for payment source

## 2013-02-25 DIAGNOSIS — E785 Hyperlipidemia, unspecified: Secondary | ICD-10-CM | POA: Insufficient documentation

## 2013-02-25 DIAGNOSIS — F329 Major depressive disorder, single episode, unspecified: Secondary | ICD-10-CM | POA: Insufficient documentation

## 2013-02-25 DIAGNOSIS — I2510 Atherosclerotic heart disease of native coronary artery without angina pectoris: Secondary | ICD-10-CM | POA: Insufficient documentation

## 2013-02-25 DIAGNOSIS — G8929 Other chronic pain: Secondary | ICD-10-CM | POA: Insufficient documentation

## 2013-02-25 DIAGNOSIS — R51 Headache: Secondary | ICD-10-CM | POA: Insufficient documentation

## 2013-02-25 DIAGNOSIS — N390 Urinary tract infection, site not specified: Secondary | ICD-10-CM | POA: Insufficient documentation

## 2013-02-25 DIAGNOSIS — R079 Chest pain, unspecified: Secondary | ICD-10-CM | POA: Insufficient documentation

## 2013-02-25 DIAGNOSIS — Z8719 Personal history of other diseases of the digestive system: Secondary | ICD-10-CM | POA: Insufficient documentation

## 2013-02-25 DIAGNOSIS — Z7982 Long term (current) use of aspirin: Secondary | ICD-10-CM | POA: Insufficient documentation

## 2013-02-25 DIAGNOSIS — Z8742 Personal history of other diseases of the female genital tract: Secondary | ICD-10-CM | POA: Insufficient documentation

## 2013-02-25 DIAGNOSIS — Z79899 Other long term (current) drug therapy: Secondary | ICD-10-CM | POA: Insufficient documentation

## 2013-02-25 DIAGNOSIS — Z8673 Personal history of transient ischemic attack (TIA), and cerebral infarction without residual deficits: Secondary | ICD-10-CM | POA: Insufficient documentation

## 2013-02-25 DIAGNOSIS — I10 Essential (primary) hypertension: Secondary | ICD-10-CM | POA: Insufficient documentation

## 2013-02-25 DIAGNOSIS — F17200 Nicotine dependence, unspecified, uncomplicated: Secondary | ICD-10-CM | POA: Insufficient documentation

## 2013-02-25 DIAGNOSIS — Z87440 Personal history of urinary (tract) infections: Secondary | ICD-10-CM | POA: Insufficient documentation

## 2013-02-25 DIAGNOSIS — F3289 Other specified depressive episodes: Secondary | ICD-10-CM | POA: Insufficient documentation

## 2013-02-25 DIAGNOSIS — F411 Generalized anxiety disorder: Secondary | ICD-10-CM | POA: Insufficient documentation

## 2013-02-25 DIAGNOSIS — I252 Old myocardial infarction: Secondary | ICD-10-CM | POA: Insufficient documentation

## 2013-02-25 DIAGNOSIS — Z951 Presence of aortocoronary bypass graft: Secondary | ICD-10-CM | POA: Insufficient documentation

## 2013-02-25 LAB — COMPREHENSIVE METABOLIC PANEL
ALT: 6 U/L (ref 0–35)
AST: 13 U/L (ref 0–37)
Albumin: 3.9 g/dL (ref 3.5–5.2)
Alkaline Phosphatase: 73 U/L (ref 39–117)
BUN: 7 mg/dL (ref 6–23)
Chloride: 102 mEq/L (ref 96–112)
Creatinine, Ser: 0.5 mg/dL (ref 0.50–1.10)
Potassium: 3.9 mEq/L (ref 3.5–5.1)
Sodium: 136 mEq/L (ref 135–145)
Total Bilirubin: 0.3 mg/dL (ref 0.3–1.2)

## 2013-02-25 LAB — CBC WITH DIFFERENTIAL/PLATELET
Basophils Absolute: 0 10*3/uL (ref 0.0–0.1)
Basophils Relative: 0 % (ref 0–1)
Eosinophils Absolute: 0 10*3/uL (ref 0.0–0.7)
HCT: 37.5 % (ref 36.0–46.0)
Lymphocytes Relative: 15 % (ref 12–46)
MCH: 34.5 pg — ABNORMAL HIGH (ref 26.0–34.0)
MCHC: 35.7 g/dL (ref 30.0–36.0)
Monocytes Absolute: 1 10*3/uL (ref 0.1–1.0)
Neutro Abs: 9 10*3/uL — ABNORMAL HIGH (ref 1.7–7.7)
Neutrophils Relative %: 76 % (ref 43–77)
RDW: 13.4 % (ref 11.5–15.5)
WBC: 11.9 10*3/uL — ABNORMAL HIGH (ref 4.0–10.5)

## 2013-02-25 LAB — URINALYSIS, ROUTINE W REFLEX MICROSCOPIC
Bilirubin Urine: NEGATIVE
Glucose, UA: NEGATIVE mg/dL
Ketones, ur: NEGATIVE mg/dL
Protein, ur: 100 mg/dL — AB
pH: 5.5 (ref 5.0–8.0)

## 2013-02-25 LAB — URINE MICROSCOPIC-ADD ON

## 2013-02-25 LAB — POCT I-STAT TROPONIN I: Troponin i, poc: 0.01 ng/mL (ref 0.00–0.08)

## 2013-02-25 MED ORDER — ONDANSETRON HCL 4 MG/2ML IJ SOLN
4.0000 mg | Freq: Once | INTRAMUSCULAR | Status: DC
  Filled 2013-02-25: qty 2

## 2013-02-25 MED ORDER — OXYCODONE-ACETAMINOPHEN 5-325 MG PO TABS: 2.0000 | ORAL_TABLET | ORAL | Status: DC | PRN

## 2013-02-25 MED ORDER — HYDROMORPHONE HCL PF 1 MG/ML IJ SOLN
1.0000 mg | Freq: Once | INTRAMUSCULAR | Status: AC
  Administered 2013-02-25: 1 mg via INTRAVENOUS
  Filled 2013-02-25: qty 1

## 2013-02-25 MED ORDER — CEPHALEXIN 500 MG PO CAPS: 500.0000 mg | ORAL_CAPSULE | Freq: Four times a day (QID) | ORAL | Status: DC

## 2013-02-25 MED ORDER — ONDANSETRON HCL 4 MG/2ML IJ SOLN
4.0000 mg | Freq: Once | INTRAMUSCULAR | Status: AC
  Administered 2013-02-25: 4 mg via INTRAVENOUS

## 2013-02-25 NOTE — ED Notes (Signed)
Pt requesting pain meds Crystal RN informed.

## 2013-02-25 NOTE — ED Notes (Signed)
Patient transported to X-ray 

## 2013-02-25 NOTE — ED Notes (Signed)
Pt c/o headache, cough, nausea, Right lower quadrant pain, right low back pain, center chest pain and center back pain x 2 days. Pt reports fever 102 at home.
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
## 2013-02-25 NOTE — ED Notes (Signed)
Pt returns from radiology, placed back on monitoring. 

## 2013-02-25 NOTE — ED Notes (Signed)
Pt requesting blood draw when IV is started.

## 2013-02-25 NOTE — ED Notes (Signed)
Pt states she is concerned about lymph nodes  Being swollen on back of neck and questions about chest xray not being down. Colin Broach. PA made aware.

## 2013-02-25 NOTE — ED Notes (Signed)
Pt placed on monitor, pluse ox and cont. bp monitoring

## 2013-02-26 ENCOUNTER — Emergency Department (HOSPITAL_COMMUNITY): Payer: No Typology Code available for payment source

## 2013-02-26 ENCOUNTER — Encounter (HOSPITAL_COMMUNITY): Payer: Self-pay | Admitting: Emergency Medicine

## 2013-02-26 ENCOUNTER — Telehealth: Payer: Self-pay | Admitting: Internal Medicine

## 2013-02-26 ENCOUNTER — Inpatient Hospital Stay (HOSPITAL_COMMUNITY)
Admission: EM | Admit: 2013-02-26 | Discharge: 2013-02-28 | DRG: 690 | Disposition: A | Discharge status: Home / Self Care | Payer: No Typology Code available for payment source | Attending: Internal Medicine | Admitting: Internal Medicine

## 2013-02-26 DIAGNOSIS — F3289 Other specified depressive episodes: Secondary | ICD-10-CM | POA: Diagnosis present

## 2013-02-26 DIAGNOSIS — Z7982 Long term (current) use of aspirin: Secondary | ICD-10-CM

## 2013-02-26 DIAGNOSIS — M549 Dorsalgia, unspecified: Secondary | ICD-10-CM | POA: Diagnosis present

## 2013-02-26 DIAGNOSIS — B9620 Unspecified Escherichia coli [E. coli] as the cause of diseases classified elsewhere: Secondary | ICD-10-CM

## 2013-02-26 DIAGNOSIS — N281 Cyst of kidney, acquired: Secondary | ICD-10-CM

## 2013-02-26 DIAGNOSIS — D72829 Elevated white blood cell count, unspecified: Secondary | ICD-10-CM | POA: Diagnosis present

## 2013-02-26 DIAGNOSIS — I10 Essential (primary) hypertension: Secondary | ICD-10-CM

## 2013-02-26 DIAGNOSIS — R109 Unspecified abdominal pain: Secondary | ICD-10-CM | POA: Diagnosis present

## 2013-02-26 DIAGNOSIS — I252 Old myocardial infarction: Secondary | ICD-10-CM

## 2013-02-26 DIAGNOSIS — I2510 Atherosclerotic heart disease of native coronary artery without angina pectoris: Secondary | ICD-10-CM | POA: Diagnosis present

## 2013-02-26 DIAGNOSIS — F17200 Nicotine dependence, unspecified, uncomplicated: Secondary | ICD-10-CM | POA: Diagnosis present

## 2013-02-26 DIAGNOSIS — A498 Other bacterial infections of unspecified site: Secondary | ICD-10-CM

## 2013-02-26 DIAGNOSIS — E785 Hyperlipidemia, unspecified: Secondary | ICD-10-CM | POA: Diagnosis present

## 2013-02-26 DIAGNOSIS — R112 Nausea with vomiting, unspecified: Secondary | ICD-10-CM

## 2013-02-26 DIAGNOSIS — Z87440 Personal history of urinary (tract) infections: Secondary | ICD-10-CM

## 2013-02-26 DIAGNOSIS — N12 Tubulo-interstitial nephritis, not specified as acute or chronic: Secondary | ICD-10-CM | POA: Diagnosis present

## 2013-02-26 DIAGNOSIS — N10 Acute tubulo-interstitial nephritis: Principal | ICD-10-CM | POA: Diagnosis present

## 2013-02-26 DIAGNOSIS — Z8673 Personal history of transient ischemic attack (TIA), and cerebral infarction without residual deficits: Secondary | ICD-10-CM

## 2013-02-26 DIAGNOSIS — F411 Generalized anxiety disorder: Secondary | ICD-10-CM | POA: Diagnosis present

## 2013-02-26 DIAGNOSIS — F329 Major depressive disorder, single episode, unspecified: Secondary | ICD-10-CM | POA: Diagnosis present

## 2013-02-26 LAB — COMPREHENSIVE METABOLIC PANEL
ALT: 5 U/L (ref 0–35)
AST: 18 U/L (ref 0–37)
Albumin: 3.8 g/dL (ref 3.5–5.2)
BUN: 6 mg/dL (ref 6–23)
CO2: 21 mEq/L (ref 19–32)
Calcium: 9.1 mg/dL (ref 8.4–10.5)
Chloride: 103 mEq/L (ref 96–112)
Creatinine, Ser: 0.49 mg/dL — ABNORMAL LOW (ref 0.50–1.10)
GFR calc non Af Amer: >90 mL/min (ref >90)
Glucose, Bld: 89 mg/dL (ref 70–99)
Total Protein: 8.2 g/dL (ref 6.0–8.3)

## 2013-02-26 LAB — URINALYSIS, ROUTINE W REFLEX MICROSCOPIC
Bilirubin Urine: NEGATIVE
Glucose, UA: NEGATIVE mg/dL
Ketones, ur: NEGATIVE mg/dL
Nitrite: POSITIVE — AB
Specific Gravity, Urine: 1.01 (ref 1.005–1.030)
pH: 6 (ref 5.0–8.0)

## 2013-02-26 LAB — CBC WITH DIFFERENTIAL/PLATELET
Basophils Absolute: 0 10*3/uL (ref 0.0–0.1)
Basophils Relative: 0 % (ref 0–1)
Eosinophils Absolute: 0.1 10*3/uL (ref 0.0–0.7)
Eosinophils Relative: 0 % (ref 0–5)
HCT: 34.9 % — ABNORMAL LOW (ref 36.0–46.0)
Lymphs Abs: 2.8 10*3/uL (ref 0.7–4.0)
MCH: 34.5 pg — ABNORMAL HIGH (ref 26.0–34.0)
MCHC: 35.2 g/dL (ref 30.0–36.0)
Monocytes Absolute: 1.8 10*3/uL — ABNORMAL HIGH (ref 0.1–1.0)
Monocytes Relative: 11 % (ref 3–12)
Neutro Abs: 11.6 10*3/uL — ABNORMAL HIGH (ref 1.7–7.7)
Neutrophils Relative %: 71 % (ref 43–77)
RDW: 13.4 % (ref 11.5–15.5)

## 2013-02-26 LAB — URINE MICROSCOPIC-ADD ON

## 2013-02-26 LAB — LIPASE, BLOOD: Lipase: 25 U/L (ref 11–59)
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
## 2013-02-26 MED ORDER — METOPROLOL TARTRATE 25 MG PO TABS
25.0000 mg | ORAL_TABLET | Freq: Two times a day (BID) | ORAL | Status: DC
  Administered 2013-02-26 – 2013-02-28 (×4): 25 mg via ORAL
  Filled 2013-02-26 (×6): qty 1

## 2013-02-26 MED ORDER — ASPIRIN EC 81 MG PO TBEC
81.0000 mg | DELAYED_RELEASE_TABLET | Freq: Every day | ORAL | Status: DC
  Administered 2013-02-27 – 2013-02-28 (×2): 81 mg via ORAL
  Filled 2013-02-26 (×3): qty 1

## 2013-02-26 MED ORDER — ONDANSETRON HCL 4 MG/2ML IJ SOLN
4.0000 mg | Freq: Four times a day (QID) | INTRAMUSCULAR | Status: DC | PRN
Start: 2013-02-26 — End: 2013-02-26

## 2013-02-26 MED ORDER — HYDROMORPHONE HCL PF 1 MG/ML IJ SOLN
1.0000 mg | Freq: Once | INTRAMUSCULAR | Status: AC
  Administered 2013-02-26: 1 mg via INTRAVENOUS
  Filled 2013-02-26: qty 1

## 2013-02-26 MED ORDER — SERTRALINE HCL 50 MG PO TABS
75.0000 mg | ORAL_TABLET | Freq: Every day | ORAL | Status: DC
  Administered 2013-02-27 – 2013-02-28 (×2): 75 mg via ORAL
  Filled 2013-02-26 (×2): qty 1

## 2013-02-26 MED ORDER — ONDANSETRON HCL 4 MG/2ML IJ SOLN: 4.0000 mg | Freq: Four times a day (QID) | INTRAMUSCULAR | Status: DC | PRN

## 2013-02-26 MED ORDER — ONDANSETRON HCL 4 MG/2ML IJ SOLN
4.0000 mg | Freq: Once | INTRAMUSCULAR | Status: AC
Start: 2013-02-26 — End: 2013-02-26
  Administered 2013-02-26: 4 mg via INTRAVENOUS
  Filled 2013-02-26: qty 2

## 2013-02-26 MED ORDER — DIVALPROEX SODIUM ER 500 MG PO TB24
1000.0000 mg | ORAL_TABLET | Freq: Every day | ORAL | Status: DC
  Administered 2013-02-26 – 2013-02-27 (×2): 1000 mg via ORAL
  Filled 2013-02-26 (×3): qty 2

## 2013-02-26 MED ORDER — DEXTROSE 5 % IV SOLN
1.0000 g | Freq: Once | INTRAVENOUS | Status: AC
  Administered 2013-02-26: 1 g via INTRAVENOUS
  Filled 2013-02-26: qty 10

## 2013-02-26 MED ORDER — MIRTAZAPINE 30 MG PO TABS
30.0000 mg | ORAL_TABLET | Freq: Every day | ORAL | Status: DC
  Administered 2013-02-27: 30 mg via ORAL
  Filled 2013-02-26 (×2): qty 1

## 2013-02-26 MED ORDER — ONDANSETRON HCL 4 MG PO TABS: 4.0000 mg | ORAL_TABLET | Freq: Four times a day (QID) | ORAL | Status: DC | PRN

## 2013-02-26 MED ORDER — SIMVASTATIN 20 MG PO TABS
20.0000 mg | ORAL_TABLET | Freq: Every evening | ORAL | Status: DC
  Administered 2013-02-26 – 2013-02-27 (×2): 20 mg via ORAL
  Filled 2013-02-26 (×3): qty 1

## 2013-02-26 MED ORDER — SODIUM CHLORIDE 0.9 % IV SOLN
INTRAVENOUS | Status: DC
  Administered 2013-02-26: 23:00:00 via INTRAVENOUS

## 2013-02-26 MED ORDER — SODIUM CHLORIDE 0.9 % IV BOLUS (SEPSIS)
1000.0000 mL | Freq: Once | INTRAVENOUS | Status: AC
  Administered 2013-02-26: 1000 mL via INTRAVENOUS

## 2013-02-26 MED ORDER — LISINOPRIL 5 MG PO TABS
5.0000 mg | ORAL_TABLET | Freq: Every day | ORAL | Status: DC
  Administered 2013-02-27 – 2013-02-28 (×2): 5 mg via ORAL
  Filled 2013-02-26 (×3): qty 1

## 2013-02-26 MED ORDER — IOHEXOL 300 MG/ML  SOLN
25.0000 mL | INTRAMUSCULAR | Status: DC | PRN
  Administered 2013-02-26: 25 mL via ORAL

## 2013-02-26 MED ORDER — MORPHINE SULFATE 2 MG/ML IJ SOLN
2.0000 mg | INTRAMUSCULAR | Status: DC | PRN
  Administered 2013-02-27 (×3): 2 mg via INTRAVENOUS
  Filled 2013-02-26 (×3): qty 1

## 2013-02-26 MED ORDER — HYDROMORPHONE HCL PF 1 MG/ML IJ SOLN
0.5000 mg | Freq: Once | INTRAMUSCULAR | Status: AC
  Administered 2013-02-26: 0.5 mg via INTRAVENOUS
  Filled 2013-02-26: qty 1

## 2013-02-26 MED ORDER — SODIUM CHLORIDE 0.9 % IV SOLN: INTRAVENOUS | Status: AC

## 2013-02-26 MED ORDER — NITROGLYCERIN 0.4 MG SL SUBL: 0.4000 mg | SUBLINGUAL_TABLET | SUBLINGUAL | Status: DC | PRN

## 2013-02-26 MED ORDER — OXYCODONE-ACETAMINOPHEN 5-325 MG PO TABS
2.0000 | ORAL_TABLET | ORAL | Status: DC | PRN
  Administered 2013-02-26: 2 via ORAL
  Filled 2013-02-26: qty 2

## 2013-02-26 MED ORDER — IOHEXOL 300 MG/ML  SOLN
100.0000 mL | Freq: Once | INTRAMUSCULAR | Status: AC | PRN
  Administered 2013-02-26: 100 mL via INTRAVENOUS

## 2013-02-26 MED ORDER — ENOXAPARIN SODIUM 40 MG/0.4ML ~~LOC~~ SOLN
40.0000 mg | SUBCUTANEOUS | Status: DC
  Administered 2013-02-26 – 2013-02-27 (×2): 40 mg via SUBCUTANEOUS
  Filled 2013-02-26 (×3): qty 0.4

## 2013-02-26 NOTE — ED Provider Notes (Signed)
Medical screening examination/treatment/procedure(s) were performed by non-physician practitioner and as supervising physician I was immediately available for consultation/collaboration.  EKG Interpretation     Ventricular Rate:  101 PR Interval:  118 QRS Duration: 80 QT Interval:  372 QTC Calculation: 482 R Axis:   80 Text Interpretation:  Sinus tachycardia Right atrial enlargement Borderline ECG Since last tracing Rate faster              Brenda [name] B. Bernette Mayers, MD 02/26/13 1416

## 2013-02-26 NOTE — ED Notes (Signed)
Pt reports a headache over the past couple of days. States that she has also been having increasing right sided abd/flank pain. Reports she has had a kidney infection in the past that she was hospitalized for before. Reports today that she has blood in her urine. Reports that she was placed on oral antibiotics but the pain is worse. Also had a fever at home.
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
## 2013-02-26 NOTE — ED Notes (Signed)
Reports she had blood drawn yesterday and has cultures pending.

## 2013-02-26 NOTE — Telephone Encounter (Signed)
Called by patient [phone].  she has a cyst on right kidney and h/o E.coli.  Thursday has a continuous h/a (generalized) (PMH migraines) feels like whole head is bruised any time she has to pee pressure causes pain.  ER yesterday and found blood in urine and released patient.  Pt still has a fever of 103F and does not feel well.  Instructed pt to return to the ED  Shirlee Latch MD [phone]

## 2013-02-27 DIAGNOSIS — R109 Unspecified abdominal pain: Secondary | ICD-10-CM | POA: Diagnosis present

## 2013-02-27 LAB — URINE CULTURE: Culture: >=100000

## 2013-02-27 LAB — BASIC METABOLIC PANEL
BUN: 4 mg/dL — ABNORMAL LOW (ref 6–23)
CO2: 25 mEq/L (ref 19–32)
Calcium: 8.4 mg/dL (ref 8.4–10.5)
Chloride: 108 mEq/L (ref 96–112)
Creatinine, Ser: 0.62 mg/dL (ref 0.50–1.10)
Glucose, Bld: 96 mg/dL (ref 70–99)
Sodium: 141 mEq/L (ref 135–145)

## 2013-02-27 LAB — CBC
Hemoglobin: 10.4 g/dL — ABNORMAL LOW (ref 12.0–15.0)
MCH: 33.4 pg (ref 26.0–34.0)
MCHC: 33.8 g/dL (ref 30.0–36.0)
MCV: 99 fL (ref 78.0–100.0)
Platelets: 202 10*3/uL (ref 150–400)
RBC: 3.11 MIL/uL — ABNORMAL LOW (ref 3.87–5.11)
WBC: 12.5 10*3/uL — ABNORMAL HIGH (ref 4.0–10.5)

## 2013-02-27 MED ORDER — MORPHINE SULFATE 2 MG/ML IJ SOLN
2.0000 mg | INTRAMUSCULAR | Status: DC | PRN
  Administered 2013-02-27 – 2013-02-28 (×3): 2 mg via INTRAVENOUS
  Filled 2013-02-27 (×3): qty 1

## 2013-02-27 MED ORDER — DEXTROSE 5 % IV SOLN
1.0000 g | INTRAVENOUS | Status: DC
  Administered 2013-02-27: 1 g via INTRAVENOUS
  Filled 2013-02-27 (×2): qty 10

## 2013-02-27 MED ORDER — MORPHINE SULFATE 2 MG/ML IJ SOLN: 1.0000 mg | INTRAMUSCULAR | Status: DC | PRN

## 2013-02-27 MED ORDER — MORPHINE SULFATE 2 MG/ML IJ SOLN
2.0000 mg | INTRAMUSCULAR | Status: DC | PRN
  Administered 2013-02-27: 2 mg via INTRAVENOUS
  Filled 2013-02-27: qty 1

## 2013-02-27 NOTE — Progress Notes (Signed)
Subjective: Brenda [name] is a 52 year old white female with PMH of depression, bipolar disorder, HTN, CAD with hx of NSTEMI, multiple UTIs and pyelonephritis, and TIA presented to the ED with worsening R sided abdominal and flank pain along with fever 103F at home. Admitted for bilateral pyelonephritis.  Patient seen at bedside this AM. Still complains of CVA pain and tenderness as well as right-sided abdominal pain. No fevers overnight.   Objective: Vital signs in last 24 hours: Filed Vitals:   02/26/13 1424 02/26/13 1822 02/26/13 2051 02/27/13 0635  BP: 135/71 118/74 179/112 118/63  Pulse: 93 81 113 74  Temp: 97.8 F (36.6 C) 99.5 F (37.5 C) 99.5 F (37.5 C) 98.6 F (37 C)  TempSrc: Oral Oral Oral   Resp: 18 14 17 18   Height:   5\' 3"  (1.6 m)   Weight:   135 lb 12.8 oz (61.598 kg)   SpO2: 97% 100% 100% 98%   Weight change:  No intake or output data in the 24 hours ending 02/27/13 1245  Physical Exam: General: Alert, cooperative, and in no apparent distress HEENT: Vision grossly intact, oropharynx clear and non-erythematous  Neck: Full range of motion without pain, supple, no lymphadenopathy or carotid bruits Lungs: Clear to ascultation bilaterally, normal work of respiration, no wheezes, rales, ronchi Heart: Regular rate and rhythm, no murmurs, gallops, or rubs Abdomen: Soft, tender to palpation on right flank, non-distended, normal bowel sounds. B/L CVA tenderness R>L.  Extremities: No cyanosis, clubbing, or edema Neurologic: Alert & oriented X3, cranial nerves II-XII intact, strength grossly intact, sensation intact to light touch  Lab Results: Basic Metabolic Panel:  Recent Labs Lab 02/26/13 1615 02/27/13 0640  NA 138 141  K 3.9 3.7  CL 103 108  CO2 21 25  GLUCOSE 89 96  BUN 6 4*  CREATININE 0.49* 0.62  CALCIUM 9.1 8.4   Liver Function Tests:  Recent Labs Lab 02/25/13 1045 02/26/13 1615  AST 13 18  ALT 6 5  ALKPHOS 73 80  BILITOT 0.3 0.4  PROT 8.4*  8.2  ALBUMIN 3.9 3.8    Recent Labs Lab 02/26/13 1615  LIPASE 25   CBC:  Recent Labs Lab 02/25/13 1045 02/26/13 1615 02/27/13 0640  WBC 11.9* 16.4* 12.5*  NEUTROABS 9.0* 11.6*  --   HGB 13.4 12.3 10.4*  HCT 37.5 34.9* 30.8*  MCV 96.6 97.8 99.0  PLT 233 230 202   Urine Drug Screen: Drugs of Abuse     Component Value Date/Time   LABOPIA NONE DETECTED 07/17/2012 1500   LABOPIA NEG 07/24/2010 1614   COCAINSCRNUR NONE DETECTED 07/17/2012 1500   COCAINSCRNUR NEG 07/24/2010 1614   LABBENZ POSITIVE* 07/17/2012 1500   LABBENZ POS* 07/24/2010 1614   AMPHETMU NONE DETECTED 07/17/2012 1500   AMPHETMU NEG 07/24/2010 1614   THCU POSITIVE* 07/17/2012 1500   LABBARB NONE DETECTED 07/17/2012 1500    Urinalysis:  Recent Labs Lab 02/25/13 1059 02/26/13 1549  COLORURINE YELLOW YELLOW  LABSPEC 1.022 1.010  PHURINE 5.5 6.0  GLUCOSEU NEGATIVE NEGATIVE  HGBUR LARGE* LARGE*  BILIRUBINUR NEGATIVE NEGATIVE  KETONESUR NEGATIVE NEGATIVE  PROTEINUR 100* NEGATIVE  UROBILINOGEN 1.0 1.0  NITRITE POSITIVE* POSITIVE*  LEUKOCYTESUR SMALL* SMALL*   Micro Results: Recent Results (from the past 240 hour(s))  URINE CULTURE     Status: None   Collection Time    02/25/13 10:59 AM      Result Value Range Status   Specimen Description URINE, CLEAN CATCH   Final  Special Requests NONE   Final   Culture  Setup Time     Final   Value: 02/25/2013 18:17     Performed at [facility]   Culture     Final   Value: >=100,000 COLONIES/mL ESCHERICHIA COLI     Performed at [facility]   Report Status 02/27/2013 FINAL   Final   Organism ID, Bacteria ESCHERICHIA COLI   Final  URINE CULTURE     Status: None   Collection Time    02/26/13  3:49 PM      Result Value Range Status   Specimen Description URINE, CLEAN CATCH   Final   Special Requests NONE   Final   Culture  Setup Time     Final   Value: 02/26/2013 20:00     Performed at [facility]   Culture     Final   Value: >=100,000  COLONIES/mL ESCHERICHIA COLI     Performed at [facility]   Report Status PENDING   Incomplete   Studies/Results: Dg Chest 2 View  02/25/2013   CLINICAL DATA:  Productive cough for 2 days.  EXAM: CHEST  2 VIEW  COMPARISON:  08/07/2012.  FINDINGS: The heart size and mediastinal contours are within normal limits. Both lungs are clear. The visualized skeletal structures are unremarkable.  IMPRESSION: No active cardiopulmonary disease.   Electronically Signed   By: Elige Ko   On: 02/25/2013 12:59   Ct Abdomen Pelvis W Contrast  02/26/2013   CLINICAL DATA:  Increasing right sided abdominal and flank pain. Hematuria. Recent fever.  EXAM: CT ABDOMEN AND PELVIS WITH CONTRAST  TECHNIQUE: Multidetector CT imaging of the abdomen and pelvis was performed using the standard protocol following bolus administration of intravenous contrast.  CONTRAST:  OMNIPAQUE IOHEXOL 300 MG/ML  SOLN  COMPARISON:  CT scan dated 10/25/2012  FINDINGS: The patient has bilateral multi focal pyelonephritis. There is slight soft tissue stranding around the upper pole of the left kidney and diffusely around the right kidney. There is no renal obstruction. No stones. There is a simple 16 mm cyst in the lateral aspect of the mid left kidney.  There is slight prominence of intrahepatic bile ducts and common bile duct. Common bile duct measures 7 mm in diameter. The ducts are slightly prominent on the prior study. Is the patient's bilirubin normal? No visible gallstones.  Spleen, pancreas, and adrenal glands are normal.  The bowel is normal. Uterus and ovaries are normal. Small amount of free fluid in the pelvis. No osseous abnormality. Lung bases are clear. Bladder appears normal.  IMPRESSION: 1. Multifocal bilateral pyelonephritis. 2. Slight prominence of the bile ducts, minimally increased since the prior study of 05/11/2011. Is the patient's bilirubin normal?   Electronically Signed   By: Geanie Cooley M.D.   On: 02/26/2013  18:29   Medications: I have reviewed the patient's current medications. Scheduled Meds: . aspirin EC  81 mg Oral Daily  . cefTRIAXone (ROCEPHIN)  IV  1 g Intravenous Q24H  . divalproex  1,000 mg Oral QHS  . enoxaparin (LOVENOX) injection  40 mg Subcutaneous Q24H  . lisinopril  5 mg Oral Daily  . metoprolol tartrate  25 mg Oral BID  . mirtazapine  30 mg Oral QHS  . sertraline  75 mg Oral Daily  . simvastatin  20 mg Oral QPM   Continuous Infusions:  PRN Meds:.morphine injection, nitroGLYCERIN, ondansetron (ZOFRAN) IV, ondansetron  Assessment/Plan: Brenda [name] is a  52 year old white female with PMH of depression, bipolar disorder, HTN, CAD with hx of NSTEMI, multiple UTIs and pyelonephritis, and TIA presented to the ED with worsening R sided abdominal and flank pain along with fever 103F at home. Admitted for bilateral pyelonephritis.  Bilateral Pyelonephritis--b/l and multifocal per CT abdomen on admission. Reports 2-3 days of increased frequency with ED visit day prior to admission revealing E coli + urine culture and u/a on admission with large Hbg, small leukocytes, and positive nitrite. Unclear reason for recurrent UTI and pyelonephritis, however has been referred to urology as an outpatient but no scheduled appointment yet. Afebrile at time of admission, however, reports subjective fever up to 103F at home. Leukocytosis with wbc up to 16.4 on admission.  WBC's 12.5 today. Patient improved clinically, still with significant CVA tenderness.  -Blood and urine culture pending  -Continue Rocephin 1 g IV q24h  -Pain control w/ morphine 2 mg q4h prn, transition to oral in AM. -Continue to monitor BMET, CBC -Will need outpatient urology follow up as she has had several UTI's in the past year and claims she has had them since she was a child.   HTN: home medications: lisinopril 5mg  qd and lopressor 25mg  bid (remote hx of cocaine abuse)  -Continue home meds  Dispo: Disposition is deferred at  this time, awaiting improvement of current medical problems.  Anticipated discharge in approximately 1-2 day(s).   The patient does have a current PCP (Ky Barban, MD) and does need an [facility] hospital follow-up appointment after discharge.  The patient does not have transportation limitations that hinder transportation to clinic appointments.  .Services Needed at time of discharge: Y = Yes, Blank = No PT:   OT:   RN:   Equipment:   Other:     LOS: 1 day   Courtney Paris, MD 02/27/2013, 12:45 PM Pager: [phone]

## 2013-02-27 NOTE — H&P (Signed)
INTERNAL MEDICINE TEACHING SERVICE Attending Admission Note  Date: 02/27/2013  Patient name: Brenda [name]  Medical record number: [id number]  Date of birth: 03-07-1961    I have seen and evaluated [name] and discussed their care with the Residency Team.  52 yr old female with pmhx significant for bipolar disorder, CAD w/ NSTEMI, recurrent UTI's, TIA, presented with flank pain and fever. The patient was recently seen in the ED and discharged home on Keflex, but reported no improvement of symptoms. She reports decreased PO intake. On exam, she has bilat CVA tenderness. Abdominal is soft without distention and with +BS and no guarding. She is afebrile with a WBC of 16,400. UA with +Nitrites, +Leukocytes,and recent UC with pansensitive E. Coli. BC are pending. At this time, diagnosis is bilateral acute pyelonephritis. She will need outpatient urology follow up. Continue tx with Rocephin 1 g IV q24hrs. Advance diet as tolerated. She is hemodynamically stable.  Jonah Blue, DO, FACP Faculty [facility] Internal Medicine Residency Program 02/27/2013, 12:56 PM

## 2013-02-27 NOTE — Progress Notes (Signed)
UR completed.  Patient changed to inpatient status r/t diagnosis of pyelonephritis and required IV antibiotics, IVF, and IV pain medication.
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
## 2013-02-28 LAB — CBC WITH DIFFERENTIAL/PLATELET
Eosinophils Absolute: 0.1 10*3/uL (ref 0.0–0.7)
Eosinophils Relative: 1 % (ref 0–5)
Hemoglobin: 10.2 g/dL — ABNORMAL LOW (ref 12.0–15.0)
Lymphs Abs: 2.7 10*3/uL (ref 0.7–4.0)
MCH: 33.2 pg (ref 26.0–34.0)
MCHC: 34.6 g/dL (ref 30.0–36.0)
MCV: 96.1 fL (ref 78.0–100.0)
Monocytes Absolute: 0.8 10*3/uL (ref 0.1–1.0)
Monocytes Relative: 8 % (ref 3–12)
Neutro Abs: 6.4 10*3/uL (ref 1.7–7.7)
RBC: 3.07 MIL/uL — ABNORMAL LOW (ref 3.87–5.11)

## 2013-02-28 LAB — BASIC METABOLIC PANEL
BUN: 7 mg/dL (ref 6–23)
CO2: 22 mEq/L (ref 19–32)
Creatinine, Ser: 0.61 mg/dL (ref 0.50–1.10)
GFR calc Af Amer: >90 mL/min (ref >90)
GFR calc non Af Amer: >90 mL/min (ref >90)
Glucose, Bld: 105 mg/dL — ABNORMAL HIGH (ref 70–99)
Potassium: 3.1 mEq/L — ABNORMAL LOW (ref 3.5–5.1)
Sodium: 143 mEq/L (ref 135–145)

## 2013-02-28 LAB — URINE CULTURE

## 2013-02-28 MED ORDER — POTASSIUM CHLORIDE CRYS ER 20 MEQ PO TBCR
40.0000 meq | EXTENDED_RELEASE_TABLET | Freq: Once | ORAL | Status: AC
  Administered 2013-02-28: 40 meq via ORAL
  Filled 2013-02-28: qty 2

## 2013-02-28 MED ORDER — CIPROFLOXACIN HCL 500 MG PO TABS: 500.0000 mg | ORAL_TABLET | Freq: Two times a day (BID) | ORAL | Status: DC

## 2013-02-28 MED ORDER — OXYCODONE-ACETAMINOPHEN 5-325 MG PO TABS: 1.0000 | ORAL_TABLET | ORAL | Status: DC | PRN

## 2013-02-28 NOTE — Discharge Summary (Signed)
Name: Brenda [name] MRN: [id number] DOB: 1960-09-21 52 y.o. PCP: Ky Barban, MD  Date of Admission: 02/26/2013  2:31 PM Date of Discharge: 02/28/2013 Attending Physician: Kem Kays  Discharge Diagnosis: 1. Pyelonephritis 2. HTN  Discharge Medications:   Medication List    STOP taking these medications       cephALEXin 500 MG capsule  Commonly known as:  KEFLEX      TAKE these medications       aspirin EC 81 MG tablet  Take 1 tablet (81 mg total) by mouth daily.     ciprofloxacin 500 MG tablet  Commonly known as:  CIPRO  Take 1 tablet (500 mg total) by mouth 2 (two) times daily.     divalproex 500 MG 24 hr tablet  Commonly known as:  DEPAKOTE ER  Take 1,000 mg by mouth at bedtime.     ibuprofen 200 MG tablet  Commonly known as:  ADVIL,MOTRIN  Take 400 mg by mouth every 6 (six) hours as needed for pain.     lisinopril 5 MG tablet  Commonly known as:  PRINIVIL,ZESTRIL  Take 1 tablet (5 mg total) by mouth daily.     metoprolol tartrate 25 MG tablet  Commonly known as:  LOPRESSOR  Take 1 tablet (25 mg total) by mouth 2 (two) times daily.     mirtazapine 30 MG tablet  Commonly known as:  REMERON  Take 30 mg by mouth at bedtime.     nitroGLYCERIN 0.4 MG SL tablet  Commonly known as:  NITROSTAT  Place 1 tablet (0.4 mg total) under the tongue every 5 (five) minutes as needed for chest pain.     oxyCODONE-acetaminophen 5-325 MG per tablet  Commonly known as:  PERCOCET/ROXICET  Take 1-2 tablets by mouth every 4 (four) hours as needed for moderate pain or severe pain.     sertraline 50 MG tablet  Commonly known as:  ZOLOFT  Take 1.5 tablets (75 mg total) by mouth daily.     simvastatin 20 MG tablet  Commonly known as:  ZOCOR  Take 1 tablet (20 mg total) by mouth every evening.        Disposition and follow-up:   Brenda [name] was discharged from [facility] in Good condition.  At the hospital follow up visit please address:  1.   Urinary symptoms? CVA tenderness/flank pain? Fever chills at home? Patient needs outpatient urology referral as she has had countless UTI's, dating back to her childhood. Needs assessment for structural abnormalities.  2.  Labs / imaging needed at time of follow-up: UA/UCx  3.  Pending labs/ test needing follow-up: none  Follow-up Appointments:     Follow-up Information   Follow up with Ky Barban, MD On 03/13/2013. (1:45 PM)    Specialty:  Internal Medicine   Contact information:   [address] [place] [address] [phone]       Discharge Instructions: Discharge Orders   Future Appointments Provider Department Dept Phone   03/13/2013 1:45 PM Ky Barban, MD [facility] [phone]   04/24/2013 3:45 PM [place] Nurse [facility] [phone]   Future Orders Complete By Expires   Call MD for:  persistant nausea and vomiting  As directed    Call MD for:  temperature >100.4  As directed       Consultations:  none  Procedures Performed:  Dg Chest 2 View  02/25/2013   CLINICAL DATA:  Productive  cough for 2 days.  EXAM: CHEST  2 VIEW  COMPARISON:  08/07/2012.  FINDINGS: The heart size and mediastinal contours are within normal limits. Both lungs are clear. The visualized skeletal structures are unremarkable.  IMPRESSION: No active cardiopulmonary disease.   Electronically Signed   By: Elige Ko   On: 02/25/2013 12:59   Ct Abdomen Pelvis W Contrast  02/26/2013   CLINICAL DATA:  Increasing right sided abdominal and flank pain. Hematuria. Recent fever.  EXAM: CT ABDOMEN AND PELVIS WITH CONTRAST  TECHNIQUE: Multidetector CT imaging of the abdomen and pelvis was performed using the standard protocol following bolus administration of intravenous contrast.  CONTRAST:  OMNIPAQUE IOHEXOL 300 MG/ML  SOLN  COMPARISON:  CT scan dated 10/25/2012  FINDINGS: The patient has bilateral multi focal pyelonephritis. There is slight  soft tissue stranding around the upper pole of the left kidney and diffusely around the right kidney. There is no renal obstruction. No stones. There is a simple 16 mm cyst in the lateral aspect of the mid left kidney.  There is slight prominence of intrahepatic bile ducts and common bile duct. Common bile duct measures 7 mm in diameter. The ducts are slightly prominent on the prior study. Is the patient's bilirubin normal? No visible gallstones.  Spleen, pancreas, and adrenal glands are normal.  The bowel is normal. Uterus and ovaries are normal. Small amount of free fluid in the pelvis. No osseous abnormality. Lung bases are clear. Bladder appears normal.  IMPRESSION: 1. Multifocal bilateral pyelonephritis. 2. Slight prominence of the bile ducts, minimally increased since the prior study of 05/11/2011. Is the patient's bilirubin normal?   Electronically Signed   By: Geanie Cooley M.D.   On: 02/26/2013 18:29   Admission HPI: Brenda [name] is a 52 year old white female with PMH of depression, bipolar disorder, HTN, CAD with hx of NSTEMI, multiple UTIs and pyelonephritis, and TIA presenting to the ED today with worsening R sided abdominal and flank pain along with fever 103F at home. She was last seen in the ED yesterday with UTI and symptoms indicative of UTI and discharged on keflex. She reports no improvement with antibiotics and was febrile overnight. Urine culture from yesterday + for Ecoli. CT abdomen done in ED today revealed b/l multifocal pyelonephritis along with a 16mm simple left renal cyst. She also reports nausea but no vomiting, chills, headaches, non-productive cough, and chest pain that has now resolved after taking nitroglycerin at home two days ago. She reports her last meal to be 4 days ago but that she has been drinking gatorade.   Hospital Course by problem list:  1. Pyelonephritis- Reports 2-3 days of increased frequency with ED visit day prior to admission revealing E coli + urine culture  and u/a on admission with large Hbg, small leukocytes, and positive nitrites. Unclear reason for recurrent UTI and pyelonephritis, however has been referred to urology as an outpatient but no scheduled appointment yet. Afebrile at time of admission, however, reports fever up to 103 F at home. CVA tenderness on exam. Leukocytosis with wbc up to 16.4 on admission. No improvement on keflex x1 day prior to admission, changed to Rocephin 1g IV q24h. CT showed multifocal bilateral pyelonephritis. WBC's trend as follows: 16.4-->12.5-->10.1. Rocephin changed to Cipro 500 mg bid to continue for another 10 days. Patient reports a long history of UTI's, needs a urology referral as an outpatient.  2. HTN- Continued home medications. Lisinopril 5mg  qd and lopressor 25mg  bid. Well controlled  during admission.  Discharge Vitals:   BP 135/52  Pulse 75  Temp(Src) 99.1 F (37.3 C) (Oral)  Resp 18  Ht 5\' 3"  (1.6 m)  Wt 135 lb 12.8 oz (61.598 kg)  BMI 24.06 kg/m2  SpO2 98%  LMP 10/12/2005  Discharge Labs:  Results for orders placed during the hospital encounter of 02/26/13 (from the past 24 hour(s))  BASIC METABOLIC PANEL     Status: Abnormal   Collection Time    02/28/13  6:02 AM      Result Value Range   Sodium 143  135 - 145 mEq/L   Potassium 3.1 (*) 3.5 - 5.1 mEq/L   Chloride 110  96 - 112 mEq/L   CO2 22  19 - 32 mEq/L   Glucose, Bld 105 (*) 70 - 99 mg/dL   BUN 7  6 - 23 mg/dL   Creatinine, Ser 4.54  0.50 - 1.10 mg/dL   Calcium 8.6  8.4 - 09.8 mg/dL   GFR calc non Af Amer >90  >90 mL/min   GFR calc Af Amer >90  >90 mL/min  CBC WITH DIFFERENTIAL     Status: Abnormal   Collection Time    02/28/13  6:02 AM      Result Value Range   WBC 10.1  4.0 - 10.5 K/uL   RBC 3.07 (*) 3.87 - 5.11 MIL/uL   Hemoglobin 10.2 (*) 12.0 - 15.0 g/dL   HCT 11.9 (*) 14.7 - 82.9 %   MCV 96.1  78.0 - 100.0 fL   MCH 33.2  26.0 - 34.0 pg   MCHC 34.6  30.0 - 36.0 g/dL   RDW 56.2  13.0 - 86.5 %   Platelets 229  150 -  400 K/uL   Neutrophils Relative % 63  43 - 77 %   Neutro Abs 6.4  1.7 - 7.7 K/uL   Lymphocytes Relative 27  12 - 46 %   Lymphs Abs 2.7  0.7 - 4.0 K/uL   Monocytes Relative 8  3 - 12 %   Monocytes Absolute 0.8  0.1 - 1.0 K/uL   Eosinophils Relative 1  0 - 5 %   Eosinophils Absolute 0.1  0.0 - 0.7 K/uL   Basophils Relative 0  0 - 1 %   Basophils Absolute 0.0  0.0 - 0.1 K/uL    Signed: Courtney Paris, MD 02/28/2013, 1:01 PM   Time Spent on Discharge: 35 minutes Services Ordered on Discharge: none Equipment Ordered on Discharge: none

## 2013-02-28 NOTE — Progress Notes (Signed)
  Date: 02/28/2013  Patient name: Brenda [name]  Medical record number: [id number]  Date of birth: [birth year]   This patient has been seen and the plan of care was discussed with the house staff. Please see their note for complete details. I concur with their findings with the following additions/corrections: States she feels better today. Pain is better controlled. Afebrile. Hemodynamically stable. BC NGTD.  UC with E. Coli over 100,000 colonies, pansensitive.  Would treat with 10 days of Cipro 500 mg PO bid. She needs urology follow up as outpatient. Otherwise, medically stable for D/C.  Jonah Blue, DO, FACP [facility] Internal Medicine Residency Program 02/28/2013, 11:43 AM

## 2013-02-28 NOTE — Progress Notes (Signed)
Subjective: Ms. Rosell is a 52 year old white female with PMH of depression, bipolar disorder, HTN, CAD with hx of NSTEMI, multiple UTIs and pyelonephritis, and TIA presented to the ED with worsening R sided abdominal and flank pain along with fever 103F at home. Admitted for bilateral pyelonephritis.  Patient seen at bedside this AM. Says she is feeling better today. Still with flank pain and CVA tenderness. Denies any fever, nausea, or vomiting.  Objective: Vital signs in last 24 hours: Filed Vitals:   02/27/13 1345 02/27/13 2058 02/27/13 2059 02/28/13 0515  BP: 100/51 121/61 121/61 135/52  Pulse: 75 74 74 75  Temp: 98.3 F (36.8 C) 98.6 F (37 C)  99.1 F (37.3 C)  TempSrc: Oral Oral  Oral  Resp: 18 16  18   Height:      Weight:      SpO2: 97% 100%  98%   Weight change:   Intake/Output Summary (Last 24 hours) at 02/28/13 0827 Last data filed at 02/28/13 0500  Gross per 24 hour  Intake    520 ml  Output      0 ml  Net    520 ml    Physical Exam: General: Alert, cooperative, and in no apparent distress HEENT: Vision grossly intact, oropharynx clear and non-erythematous  Neck: Full range of motion without pain, supple, no lymphadenopathy or carotid bruits Lungs: Clear to ascultation bilaterally, normal work of respiration, no wheezes, rales, ronchi Heart: Regular rate and rhythm, no murmurs, gallops, or rubs Abdomen: Soft, tender to palpation on right flank, non-distended, normal bowel sounds. B/L CVA tenderness R>L.  Extremities: No cyanosis, clubbing, or edema Neurologic: Alert & oriented X3, cranial nerves II-XII intact, strength grossly intact, sensation intact to light touch  Lab Results: Basic Metabolic Panel:  Recent Labs Lab 02/27/13 0640 02/28/13 0602  NA 141 143  K 3.7 3.1*  CL 108 110  CO2 25 22  GLUCOSE 96 105*  BUN 4* 7  CREATININE 0.62 0.61  CALCIUM 8.4 8.6   Liver Function Tests:  Recent Labs Lab 02/25/13 1045 02/26/13 1615  AST 13 18    ALT 6 5  ALKPHOS 73 80  BILITOT 0.3 0.4  PROT 8.4* 8.2  ALBUMIN 3.9 3.8    Recent Labs Lab 02/26/13 1615  LIPASE 25   CBC:  Recent Labs Lab 02/26/13 1615 02/27/13 0640 02/28/13 0602  WBC 16.4* 12.5* 10.1  NEUTROABS 11.6*  --  6.4  HGB 12.3 10.4* 10.2*  HCT 34.9* 30.8* 29.5*  MCV 97.8 99.0 96.1  PLT 230 202 229   Urine Drug Screen: Drugs of Abuse     Component Value Date/Time   LABOPIA NONE DETECTED 07/17/2012 1500   LABOPIA NEG 07/24/2010 1614   COCAINSCRNUR NONE DETECTED 07/17/2012 1500   COCAINSCRNUR NEG 07/24/2010 1614   LABBENZ POSITIVE* 07/17/2012 1500   LABBENZ POS* 07/24/2010 1614   AMPHETMU NONE DETECTED 07/17/2012 1500   AMPHETMU NEG 07/24/2010 1614   THCU POSITIVE* 07/17/2012 1500   LABBARB NONE DETECTED 07/17/2012 1500    Urinalysis:  Recent Labs Lab 02/25/13 1059 02/26/13 1549  COLORURINE YELLOW YELLOW  LABSPEC 1.022 1.010  PHURINE 5.5 6.0  GLUCOSEU NEGATIVE NEGATIVE  HGBUR LARGE* LARGE*  BILIRUBINUR NEGATIVE NEGATIVE  KETONESUR NEGATIVE NEGATIVE  PROTEINUR 100* NEGATIVE  UROBILINOGEN 1.0 1.0  NITRITE POSITIVE* POSITIVE*  LEUKOCYTESUR SMALL* SMALL*   Micro Results: Recent Results (from the past 240 hour(s))  URINE CULTURE     Status: None   Collection Time  02/25/13 10:59 AM      Result Value Range Status   Specimen Description URINE, CLEAN CATCH   Final   Special Requests NONE   Final   Culture  Setup Time     Final   Value: 02/25/2013 18:17     Performed at [facility]   Culture     Final   Value: >=100,000 COLONIES/mL ESCHERICHIA COLI     Performed at [facility]   Report Status 02/27/2013 FINAL   Final   Organism ID, Bacteria ESCHERICHIA COLI   Final  URINE CULTURE     Status: None   Collection Time    02/26/13  3:49 PM      Result Value Range Status   Specimen Description URINE, CLEAN CATCH   Final   Special Requests NONE   Final   Culture  Setup Time     Final   Value: 02/26/2013 20:00     Performed at [facility]   Culture     Final   Value: >=100,000 COLONIES/mL ESCHERICHIA COLI     Performed at [facility]   Report Status 02/28/2013 FINAL   Final   Organism ID, Bacteria ESCHERICHIA COLI   Final   Studies/Results: Ct Abdomen Pelvis W Contrast  02/26/2013   CLINICAL DATA:  Increasing right sided abdominal and flank pain. Hematuria. Recent fever.  EXAM: CT ABDOMEN AND PELVIS WITH CONTRAST  TECHNIQUE: Multidetector CT imaging of the abdomen and pelvis was performed using the standard protocol following bolus administration of intravenous contrast.  CONTRAST:  OMNIPAQUE IOHEXOL 300 MG/ML  SOLN  COMPARISON:  CT scan dated 10/25/2012  FINDINGS: The patient has bilateral multi focal pyelonephritis. There is slight soft tissue stranding around the upper pole of the left kidney and diffusely around the right kidney. There is no renal obstruction. No stones. There is a simple 16 mm cyst in the lateral aspect of the mid left kidney.  There is slight prominence of intrahepatic bile ducts and common bile duct. Common bile duct measures 7 mm in diameter. The ducts are slightly prominent on the prior study. Is the patient's bilirubin normal? No visible gallstones.  Spleen, pancreas, and adrenal glands are normal.  The bowel is normal. Uterus and ovaries are normal. Small amount of free fluid in the pelvis. No osseous abnormality. Lung bases are clear. Bladder appears normal.  IMPRESSION: 1. Multifocal bilateral pyelonephritis. 2. Slight prominence of the bile ducts, minimally increased since the prior study of 05/11/2011. Is the patient's bilirubin normal?   Electronically Signed   By: Geanie Cooley M.D.   On: 02/26/2013 18:29   Medications: I have reviewed the patient's current medications. Scheduled Meds: . aspirin EC  81 mg Oral Daily  . cefTRIAXone (ROCEPHIN)  IV  1 g Intravenous Q24H  . divalproex  1,000 mg Oral QHS  . enoxaparin (LOVENOX) injection  40 mg Subcutaneous Q24H  . lisinopril   5 mg Oral Daily  . metoprolol tartrate  25 mg Oral BID  . mirtazapine  30 mg Oral QHS  . sertraline  75 mg Oral Daily  . simvastatin  20 mg Oral QPM   Continuous Infusions:  PRN Meds:.morphine injection, nitroGLYCERIN, ondansetron (ZOFRAN) IV, ondansetron  Assessment/Plan: Ms. Arseneault is a 52 year old white female with PMH of depression, bipolar disorder, HTN, CAD with hx of NSTEMI, multiple UTIs and pyelonephritis, and TIA presented to the ED with worsening R sided abdominal and flank pain along  with fever 103F at home. Admitted for bilateral pyelonephritis.  Bilateral Pyelonephritis--b/l and multifocal per CT abdomen on admission. Reports 2-3 days of increased frequency with ED visit day prior to admission revealing E coli + urine culture and u/a on admission with large Hbg, small leukocytes, and positive nitrite. Unclear reason for recurrent UTI and pyelonephritis, however has been referred to urology as an outpatient but no scheduled appointment yet. Afebrile at time of admission, however, reports subjective fever up to 103F at home. Leukocytosis with wbc up to 16.4 on admission. 10.1 this AM. Clinically improved today, still with significant CVA tenderness.  -Blood culture w/ no growth, urine culture + for E. Coli, pan-sensitive. -On Rocephin 1 g IV q24h. Will switch to Cipro po today for total of 7 days course.  -Pain control w/ morphine 2 mg q4h prn, transition to oral today. -Will need outpatient urology follow up as she has had several UTI's in the past year and claims she has had them since she was a child.   HTN: home medications: lisinopril 5mg  qd and lopressor 25mg  bid (remote hx of cocaine abuse)  -Continue home meds  Dispo: Disposition is deferred at this time, awaiting improvement of current medical problems.  Anticipated discharge today.  The patient does have a current PCP (Ky Barban, MD) and does need an [facility] hospital follow-up appointment after discharge.  The  patient does not have transportation limitations that hinder transportation to clinic appointments.  .Services Needed at time of discharge: Y = Yes, Blank = No PT:   OT:   RN:   Equipment:   Other:     LOS: 2 days   Courtney Paris, MD 02/28/2013, 8:26 AM Pager: [phone]

## 2013-03-05 LAB — CULTURE, BLOOD (ROUTINE X 2): Culture: NO GROWTH

## 2013-03-08 NOTE — Discharge Summary (Signed)
  Date: 03/08/2013  Patient name: Brenda [name]  Medical record number: [id number]  Date of birth: 01-May-1960   This patient has been discussed with the house staff. Please see their note for complete details. I concur with their findings and plan.  Jonah Blue, DO, FACP Faculty [facility] Residency [facility] 03/08/2013, 4:04 PM

## 2013-03-13 ENCOUNTER — Encounter: Payer: Self-pay | Admitting: Internal Medicine

## 2013-03-13 ENCOUNTER — Ambulatory Visit (INDEPENDENT_AMBULATORY_CARE_PROVIDER_SITE_OTHER): Payer: No Typology Code available for payment source | Admitting: Internal Medicine

## 2013-03-13 VITALS — BP 134/70 | HR 77 | Temp 97.4°F | Wt 135.5 lb

## 2013-03-13 DIAGNOSIS — I10 Essential (primary) hypertension: Secondary | ICD-10-CM

## 2013-03-13 DIAGNOSIS — R569 Unspecified convulsions: Secondary | ICD-10-CM

## 2013-03-13 DIAGNOSIS — E785 Hyperlipidemia, unspecified: Secondary | ICD-10-CM

## 2013-03-13 DIAGNOSIS — F329 Major depressive disorder, single episode, unspecified: Secondary | ICD-10-CM

## 2013-03-13 DIAGNOSIS — Z0000 Encounter for general adult medical examination without abnormal findings: Secondary | ICD-10-CM

## 2013-03-13 DIAGNOSIS — G8929 Other chronic pain: Secondary | ICD-10-CM

## 2013-03-13 DIAGNOSIS — A498 Other bacterial infections of unspecified site: Secondary | ICD-10-CM

## 2013-03-13 DIAGNOSIS — M549 Dorsalgia, unspecified: Secondary | ICD-10-CM

## 2013-03-13 DIAGNOSIS — N390 Urinary tract infection, site not specified: Secondary | ICD-10-CM

## 2013-03-13 DIAGNOSIS — B9620 Unspecified Escherichia coli [E. coli] as the cause of diseases classified elsewhere: Secondary | ICD-10-CM

## 2013-03-13 MED ORDER — MIRTAZAPINE 30 MG PO TABS: 30.0000 mg | ORAL_TABLET | Freq: Every day | ORAL | Status: DC

## 2013-03-13 MED ORDER — DIVALPROEX SODIUM ER 500 MG PO TB24: 1000.0000 mg | ORAL_TABLET | Freq: Every day | ORAL | Status: DC

## 2013-03-13 MED ORDER — SIMVASTATIN 20 MG PO TABS: 20.0000 mg | ORAL_TABLET | Freq: Every evening | ORAL | Status: DC

## 2013-03-13 MED ORDER — OXYCODONE-ACETAMINOPHEN 5-325 MG PO TABS: 1.0000 | ORAL_TABLET | ORAL | Status: DC | PRN

## 2013-03-13 MED ORDER — LISINOPRIL 5 MG PO TABS: 5.0000 mg | ORAL_TABLET | Freq: Every day | ORAL | Status: DC

## 2013-03-13 MED ORDER — METOPROLOL TARTRATE 25 MG PO TABS: 25.0000 mg | ORAL_TABLET | Freq: Two times a day (BID) | ORAL | Status: DC

## 2013-03-13 NOTE — Patient Instructions (Signed)
General Instructions: -You need to schedule a mammogram.  -Follow up with Urology. You will be called in regards to this appointment.  -Follow up with the Pain Clinic for further help with back pain. Let us know if you need a referral to Orthopedic Surgery.  -Follow up with your GYN provider in January.  -Follow up with [place] in 3 months.    Treatment Goals:  Goals (1 Years of Data) as of 03/13/13         As of Today 02/28/13 02/27/13 02/27/13 02/27/13     Blood Pressure    . Blood Pressure < 140/90  134/70 135/52 121/61 121/61 100/51      Progress Toward Treatment Goals:  Treatment Goal 03/13/2013  Blood pressure at goal  Stop smoking -  Prevent falls at goal    Self Care Goals & Plans:  Self Care Goal 03/13/2013  Manage my medications take my medicines as prescribed; refill my medications on time  Eat healthy foods eat foods that are low in salt; eat baked foods instead of fried foods  Be physically active find an activity I enjoy  Stop smoking go to the [employer] ([email or web address])  Prevent falls use home fall prevention checklist to improve safety; have my vision checked  Meeting treatment goals -    No flowsheet data found.   Care Management & Community Referrals:  Referral 03/13/2013  Referrals made for care management support none needed  Referrals made to community resources -

## 2013-03-14 LAB — URINALYSIS, MICROSCOPIC ONLY
Casts: NONE SEEN
Crystals: NONE SEEN

## 2013-03-14 LAB — URINALYSIS, ROUTINE W REFLEX MICROSCOPIC
Bilirubin Urine: NEGATIVE
Ketones, ur: NEGATIVE mg/dL
Nitrite: POSITIVE — AB
Urobilinogen, UA: 0.2 mg/dL (ref 0.0–1.0)

## 2013-03-14 NOTE — Assessment & Plan Note (Signed)
She is due for a mammogram but she wants to call it and schedule this on her own.

## 2013-03-14 NOTE — Assessment & Plan Note (Addendum)
She has lumbar spine chronic pain with sciatic. Denies bowel/bladder incontinence. She was given Percocet 5mg  #60 upon her recent discharge and states that is out of this medication. She has had multiple controlled medication violations with no narcotics Rx form the [facility]. She states that Tramadol does not work for her pain and requests refill for Percocet.   -Rx Percocet 5mg  1-2 tab q4hr PRN for pain #20 with no more refills. Pt agrees and understands.   -Referred to Dr. Fritzi Mandes at the Pain Clinic -she declines referral to PT.

## 2013-03-14 NOTE — Assessment & Plan Note (Signed)
She has dysuria but states that is very minimum compared to before. She has right CVA tenderness but it does not radiate to her abdomen, she states that this is improving since her discharge. No fever/chills, N/V.  -UA with nitrite positive, no leucs. -Urine culture sent.  -referral to Urology given recurrent UTIs

## 2013-03-14 NOTE — Assessment & Plan Note (Signed)
BP Readings from Last 3 Encounters:  03/13/13 134/70  02/28/13 135/52  02/25/13 130/71    Lab Results  Component Value Date   NA 143 02/28/2013   K 3.1* 02/28/2013   CREATININE 0.61 02/28/2013    Assessment: Blood pressure control: controlled Progress toward BP goal:  at goal Comments: She is on lisinopril 5mg  daily and metoprolol 25mg  BID.   Plan: Medications:  continue current medications Educational resources provided:   Self management tools provided:   Other plans: Follow up in 3 months.

## 2013-03-14 NOTE — Progress Notes (Signed)
   Subjective:    Patient ID: Brenda [name], female    DOB: [birth year], 52 y.o.   MRN: [id number]  HPI Brenda [name] is a 52 year old woman with PMH of HTN, chronic back pain, history of polysubstance abuse, Bipolar disorder type I, left renal cyst, who presents for follow up visit for HFU for pyelonephritis. She finished her antibiotic treatment, ciprofloxacin, 3 days ago. She denies fever/chills, N/V, but has had occasional dysuria and has persistent right flank pain that is slowly improving. She also has sciatic pain that is a chronic issue, worse on her right lower back and right leg.    Review of Systems  Constitutional: Negative for fever, chills, diaphoresis, activity change, appetite change, fatigue and unexpected weight change.  Respiratory: Negative for cough and shortness of breath.   Cardiovascular: Negative for chest pain, palpitations and leg swelling.  Gastrointestinal: Negative for nausea, vomiting, abdominal pain, diarrhea and constipation.  Genitourinary: Positive for dysuria, urgency, frequency and flank pain. Negative for vaginal discharge.  Musculoskeletal: Positive for back pain.  Skin: Negative for rash.  Neurological: Negative for dizziness, light-headedness and headaches.  Psychiatric/Behavioral: Negative for confusion and agitation.       Objective:   Physical Exam  Nursing note and vitals reviewed. Constitutional: She is oriented to person, place, and time. She appears well-developed and well-nourished. No distress.  Eyes: No scleral icterus.  Cardiovascular: Normal rate and regular rhythm.   Murmur heard. 2/6 SEM heard best at the RUSB.  Pulmonary/Chest: Effort normal and breath sounds normal. No respiratory distress. She has no wheezes. She has no rales.  Abdominal: Soft. Bowel sounds are normal. She exhibits no distension and no mass. There is tenderness. There is no rebound and no guarding.  Mild TTP at the left lower quadrant.   Genitourinary:  Right CVA  tenderness.    Musculoskeletal: She exhibits no edema.  Lumbar paraspinal tenderness.  Positive straight leg raise.  Strength 5/5 in LE.    Neurological: She is alert and oriented to person, place, and time. Coordination normal.  Skin: Skin is warm and dry. No rash noted. She is not diaphoretic. No erythema. No pallor.  Psychiatric: She has a normal mood and affect.          Assessment & Plan:

## 2013-03-15 NOTE — Progress Notes (Signed)
Case discussed with Dr. Kennerly soon after the resident saw the patient.  We reviewed the resident's history and exam and pertinent patient test results.  I agree with the assessment, diagnosis, and plan of care documented in the resident's note. 

## 2013-03-16 LAB — URINE CULTURE

## 2013-03-22 IMAGING — CR DG ANKLE COMPLETE 3+V*L*
3 series · 3 of 3 positions shown · non-contrast
Comparison: None.

CLINICAL DATA: Ankle pain secondary to a twisting injury.

LEFT ANKLE COMPLETE - 3+ VIEW

[x ankle ap left]
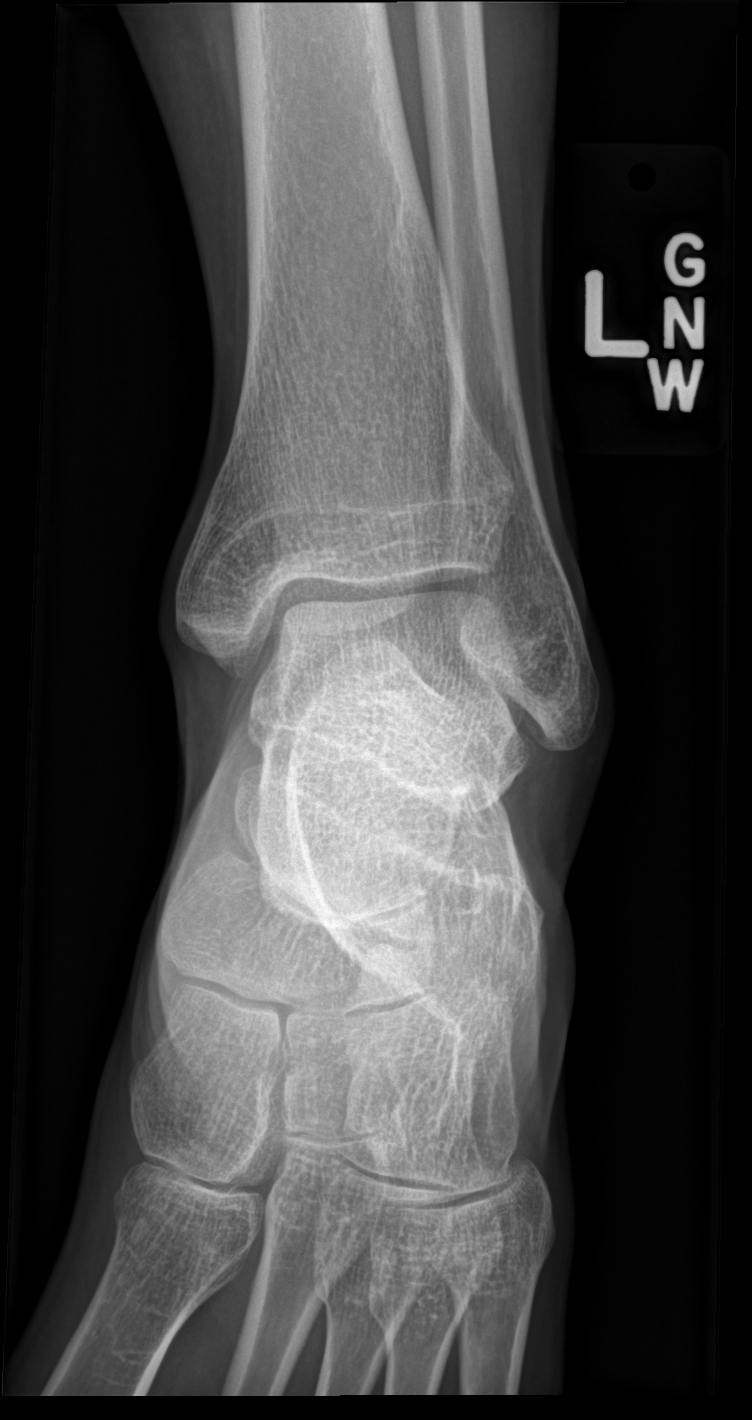

[x ankle obl left]
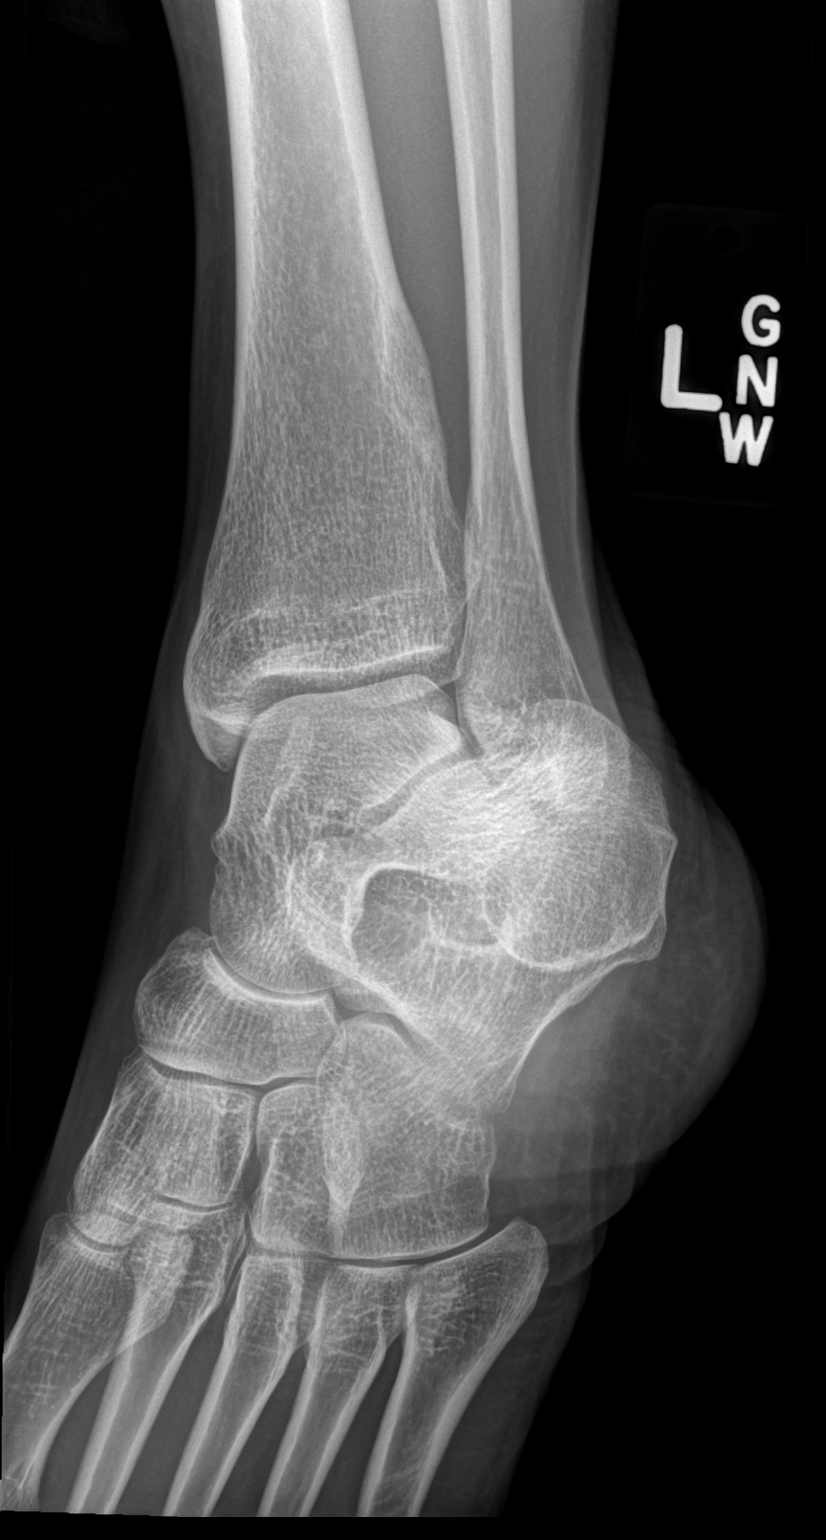

[x ankle lat left]
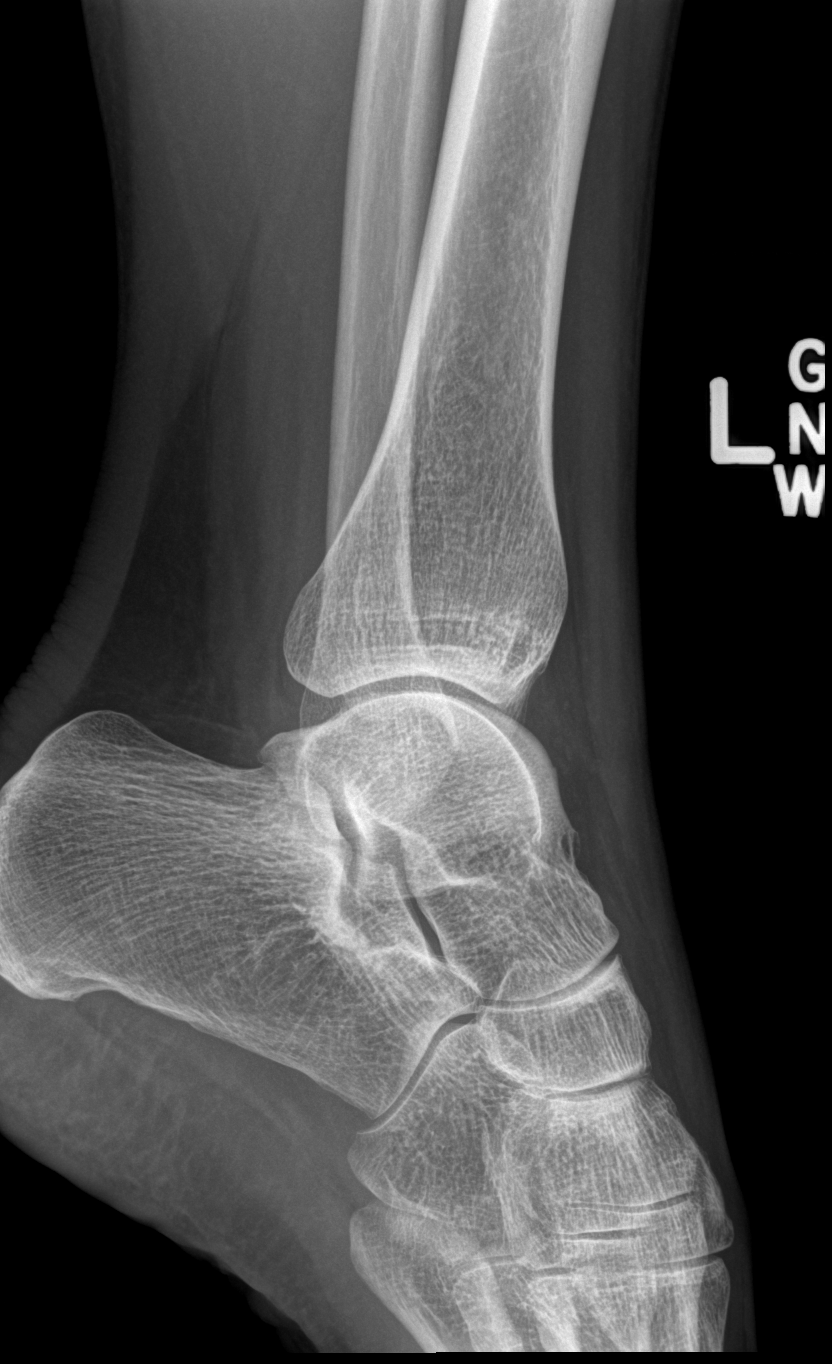

[3 of 3 positions shown; findings below may reference images not displayed]

FINDINGS: There is no fracture, dislocation, or joint effusion.
IMPRESSION: Normal exam.

## 2013-03-23 ENCOUNTER — Ambulatory Visit: Payer: Self-pay | Admitting: Physical Medicine and Rehabilitation

## 2013-03-30 ENCOUNTER — Encounter
Payer: No Typology Code available for payment source | Attending: Physical Medicine and Rehabilitation | Admitting: Physical Medicine and Rehabilitation

## 2013-03-30 ENCOUNTER — Telehealth: Payer: Self-pay | Admitting: *Deleted

## 2013-03-30 ENCOUNTER — Encounter: Payer: Self-pay | Admitting: Physical Medicine and Rehabilitation

## 2013-03-30 VITALS — BP 173/82 | HR 107 | Resp 14 | Ht 63.0 in | Wt 130.6 lb

## 2013-03-30 DIAGNOSIS — G894 Chronic pain syndrome: Secondary | ICD-10-CM

## 2013-03-30 DIAGNOSIS — M545 Low back pain, unspecified: Secondary | ICD-10-CM

## 2013-03-30 DIAGNOSIS — M549 Dorsalgia, unspecified: Secondary | ICD-10-CM | POA: Insufficient documentation

## 2013-03-30 DIAGNOSIS — G8929 Other chronic pain: Secondary | ICD-10-CM | POA: Insufficient documentation
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
## 2013-03-30 NOTE — Telephone Encounter (Addendum)
[facility] can only get Depakote delayed release not ER and they only have 250 mg tabs.  Can they switch?  Will need new directions.  Also the Rx for metoprolol 25 mg is to take twice a day and you only gave # 30,  Can they change to # 60?

## 2013-03-30 NOTE — Patient Instructions (Signed)
Would recommend follow up with neurosurgery for options

## 2013-03-31 ENCOUNTER — Other Ambulatory Visit: Payer: Self-pay | Admitting: Internal Medicine

## 2013-03-31 DIAGNOSIS — F319 Bipolar disorder, unspecified: Secondary | ICD-10-CM

## 2013-03-31 DIAGNOSIS — I10 Essential (primary) hypertension: Secondary | ICD-10-CM

## 2013-03-31 MED ORDER — METOPROLOL TARTRATE 25 MG PO TABS: 25.0000 mg | ORAL_TABLET | Freq: Two times a day (BID) | ORAL | Status: DC

## 2013-03-31 MED ORDER — DIVALPROEX SODIUM 250 MG PO DR TAB: 500.0000 mg | DELAYED_RELEASE_TABLET | Freq: Two times a day (BID) | ORAL | Status: DC

## 2013-03-31 NOTE — Telephone Encounter (Signed)
Hi Gayle,    Medications changed and faxed per Epic. Thanks! SK
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
## 2013-04-24 ENCOUNTER — Ambulatory Visit: Payer: Self-pay

## 2013-04-28 ENCOUNTER — Ambulatory Visit: Payer: Self-pay

## 2013-05-04 NOTE — Addendum Note (Signed)
Addended by: Hulan Fray on: 05/04/2013 02:25 PM   Modules accepted: Orders

## 2013-05-15 ENCOUNTER — Telehealth: Payer: Self-pay | Admitting: *Deleted

## 2013-05-15 NOTE — Telephone Encounter (Signed)
Call made to pt after pcp was informed that urology referral had been canceled because pt's [facility] "orange card" had expired. Pt needs to return to clinic and have card updated in order for our office to proceed with the referral.  Message left on pt's recorder to contact office.Despina Hidden Cassady2/2/20151:44 PM

## 2013-05-22 ENCOUNTER — Telehealth: Payer: Self-pay | Admitting: *Deleted

## 2013-05-22 ENCOUNTER — Encounter: Payer: Self-pay | Admitting: Internal Medicine

## 2013-05-22 ENCOUNTER — Ambulatory Visit (INDEPENDENT_AMBULATORY_CARE_PROVIDER_SITE_OTHER): Payer: Self-pay | Admitting: Internal Medicine

## 2013-05-22 VITALS — BP 177/88 | HR 86 | Temp 98.6°F | Ht 63.0 in | Wt 134.0 lb

## 2013-05-22 DIAGNOSIS — I10 Essential (primary) hypertension: Secondary | ICD-10-CM

## 2013-05-22 DIAGNOSIS — R109 Unspecified abdominal pain: Secondary | ICD-10-CM

## 2013-05-22 LAB — BASIC METABOLIC PANEL WITH GFR
BUN: 14 mg/dL (ref 6–23)
CHLORIDE: 107 meq/L (ref 96–112)
CO2: 17 mEq/L — ABNORMAL LOW (ref 19–32)
Calcium: 9.5 mg/dL (ref 8.4–10.5)
Creat: 0.58 mg/dL (ref 0.50–1.10)
GFR, Est African American: >89 mL/min
Glucose, Bld: 100 mg/dL — ABNORMAL HIGH (ref 70–99)
POTASSIUM: 4 meq/L (ref 3.5–5.3)
Sodium: 136 mEq/L (ref 135–145)

## 2013-05-22 MED ORDER — TRAMADOL HCL 50 MG PO TABS: 50.0000 mg | ORAL_TABLET | Freq: Four times a day (QID) | ORAL | Status: DC | PRN

## 2013-05-22 NOTE — Telephone Encounter (Signed)
Pt here for acute visit in [facility].  Spoke with pt regarding her expired card and she has already made arrangements to meet with D. [facility] on 05/25/13. Phone call complete.Despina Hidden Cassady2/9/20154:10 PM

## 2013-05-22 NOTE — Assessment & Plan Note (Signed)
BP elevated at 177/88 today but likely due to pain.  Continue current medications, reassess at follow-up visit.

## 2013-05-22 NOTE — Telephone Encounter (Signed)
Called pt again and she is c/o nausea, diarrhea 3 days ago.  Friday wt 140, today 142. Pt having pain bilateral flank pain. 7/10 on pain scale.  Hx: pyelonenephritis   Will see today.

## 2013-05-22 NOTE — Assessment & Plan Note (Signed)
Patient presents with right sided flank pain today.  Urine dip negative.  Sent UA with reflex culture.  Instructed patient to take Tylenol 975 mg TID for 4-5 days.  Also gave rx for Tramadol 50 mg q8h prn #45 to use for breakthrough pain.  Decided not to pursue further imaging today given negative urine dipstick and recent CT abdomen and pelvis in 02/2013.  Patient would most likely benefit from further workup with cystoscopy.  Referral to urology pending orange card renewal on Thursday.  BMP today as well.

## 2013-05-22 NOTE — Patient Instructions (Signed)
Follow-up with Dr. Hayes Ludwig in 3 weeks.   Keep up the good work taking all of your medicines as prescribed.  We are giving you a prescription for pain medication today.  Only take as needed for breakthrough pain, this can be sedating.  You may also take Tylenol 975 mg (3 regular strength tablets) three times daily for the next 5 days.   Don't forget to attend your appointment with Marlana Latus on Thursday to fill out orange card paperwork.  We will refer you to urology as soon as you do so.   We are checking a blood test and sending your urine for formal analysis today.  I will call you if the results are abnormal.
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
## 2013-05-22 NOTE — Telephone Encounter (Signed)
Pt called in for an appointment, left message on answer machine. I returned the call and no answer. Asked pt to call back. Pt # [phone]

## 2013-05-23 LAB — URINALYSIS, ROUTINE W REFLEX MICROSCOPIC
Bilirubin Urine: NEGATIVE
Glucose, UA: NEGATIVE mg/dL
KETONES UR: NEGATIVE mg/dL
LEUKOCYTES UA: NEGATIVE
NITRITE: NEGATIVE
PH: 6 (ref 5.0–8.0)
Protein, ur: NEGATIVE mg/dL
SPECIFIC GRAVITY, URINE: 1.023 (ref 1.005–1.030)
Urobilinogen, UA: 0.2 mg/dL (ref 0.0–1.0)

## 2013-05-23 LAB — URINALYSIS, MICROSCOPIC ONLY
Bacteria, UA: NONE SEEN
CASTS: NONE SEEN
CRYSTALS: NONE SEEN

## 2013-05-23 NOTE — Progress Notes (Signed)
Case discussed with Dr. Ivin Poot at the time of the visit.  We reviewed the resident's history and exam and pertinent patient test results.  I agree with the assessment, diagnosis, and plan of care documented in the resident's note.

## 2013-05-25 ENCOUNTER — Ambulatory Visit: Payer: No Typology Code available for payment source

## 2013-06-19 ENCOUNTER — Encounter: Payer: Self-pay | Admitting: Internal Medicine

## 2013-07-02 ENCOUNTER — Other Ambulatory Visit: Payer: Self-pay | Admitting: Internal Medicine

## 2013-07-12 IMAGING — CR DG CHEST 2V
2 series · 2 of 2 positions shown · non-contrast
Comparison: 07/10/2010

CLINICAL DATA: Chest pain

CHEST - 2 VIEW

[w chest pa]
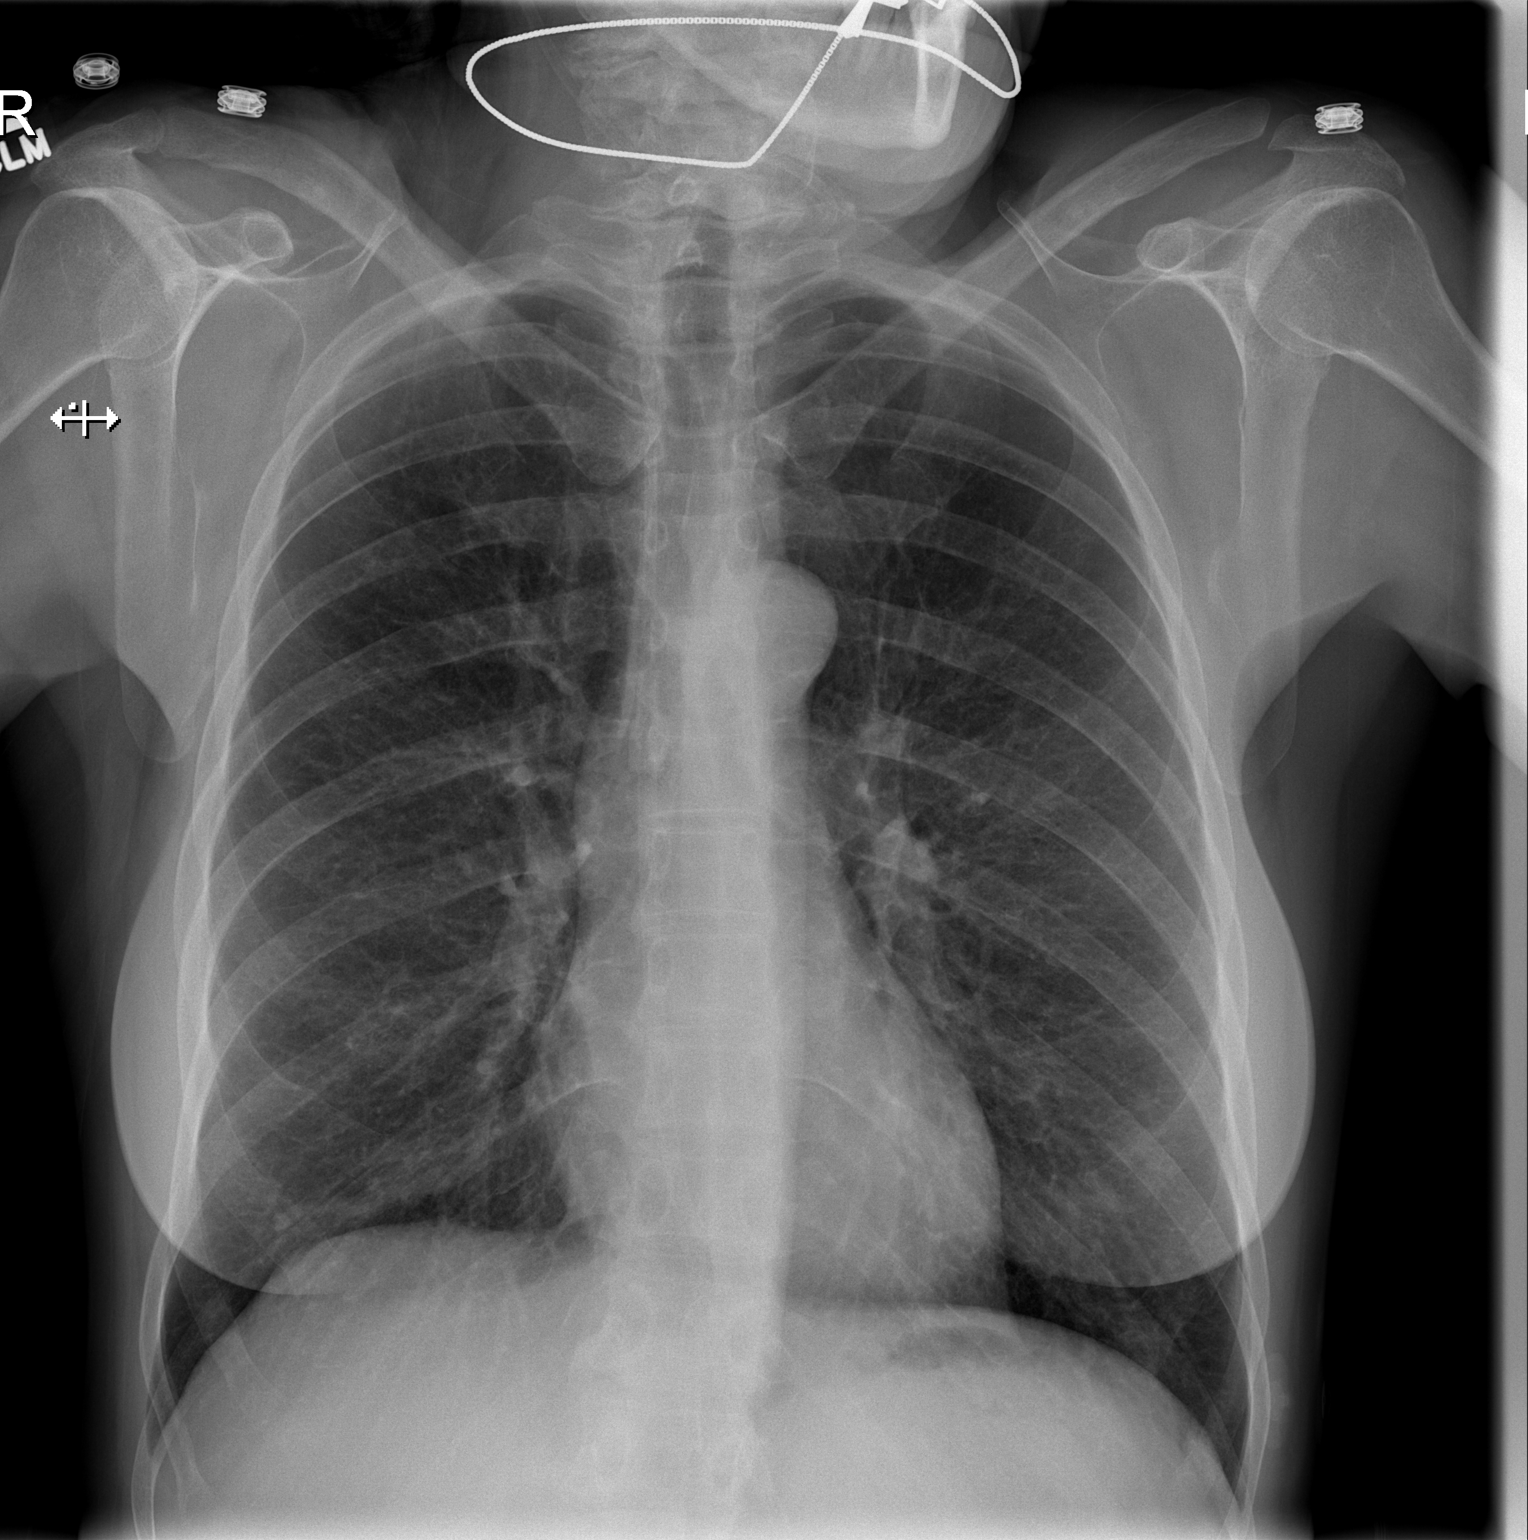

[w chest lat]
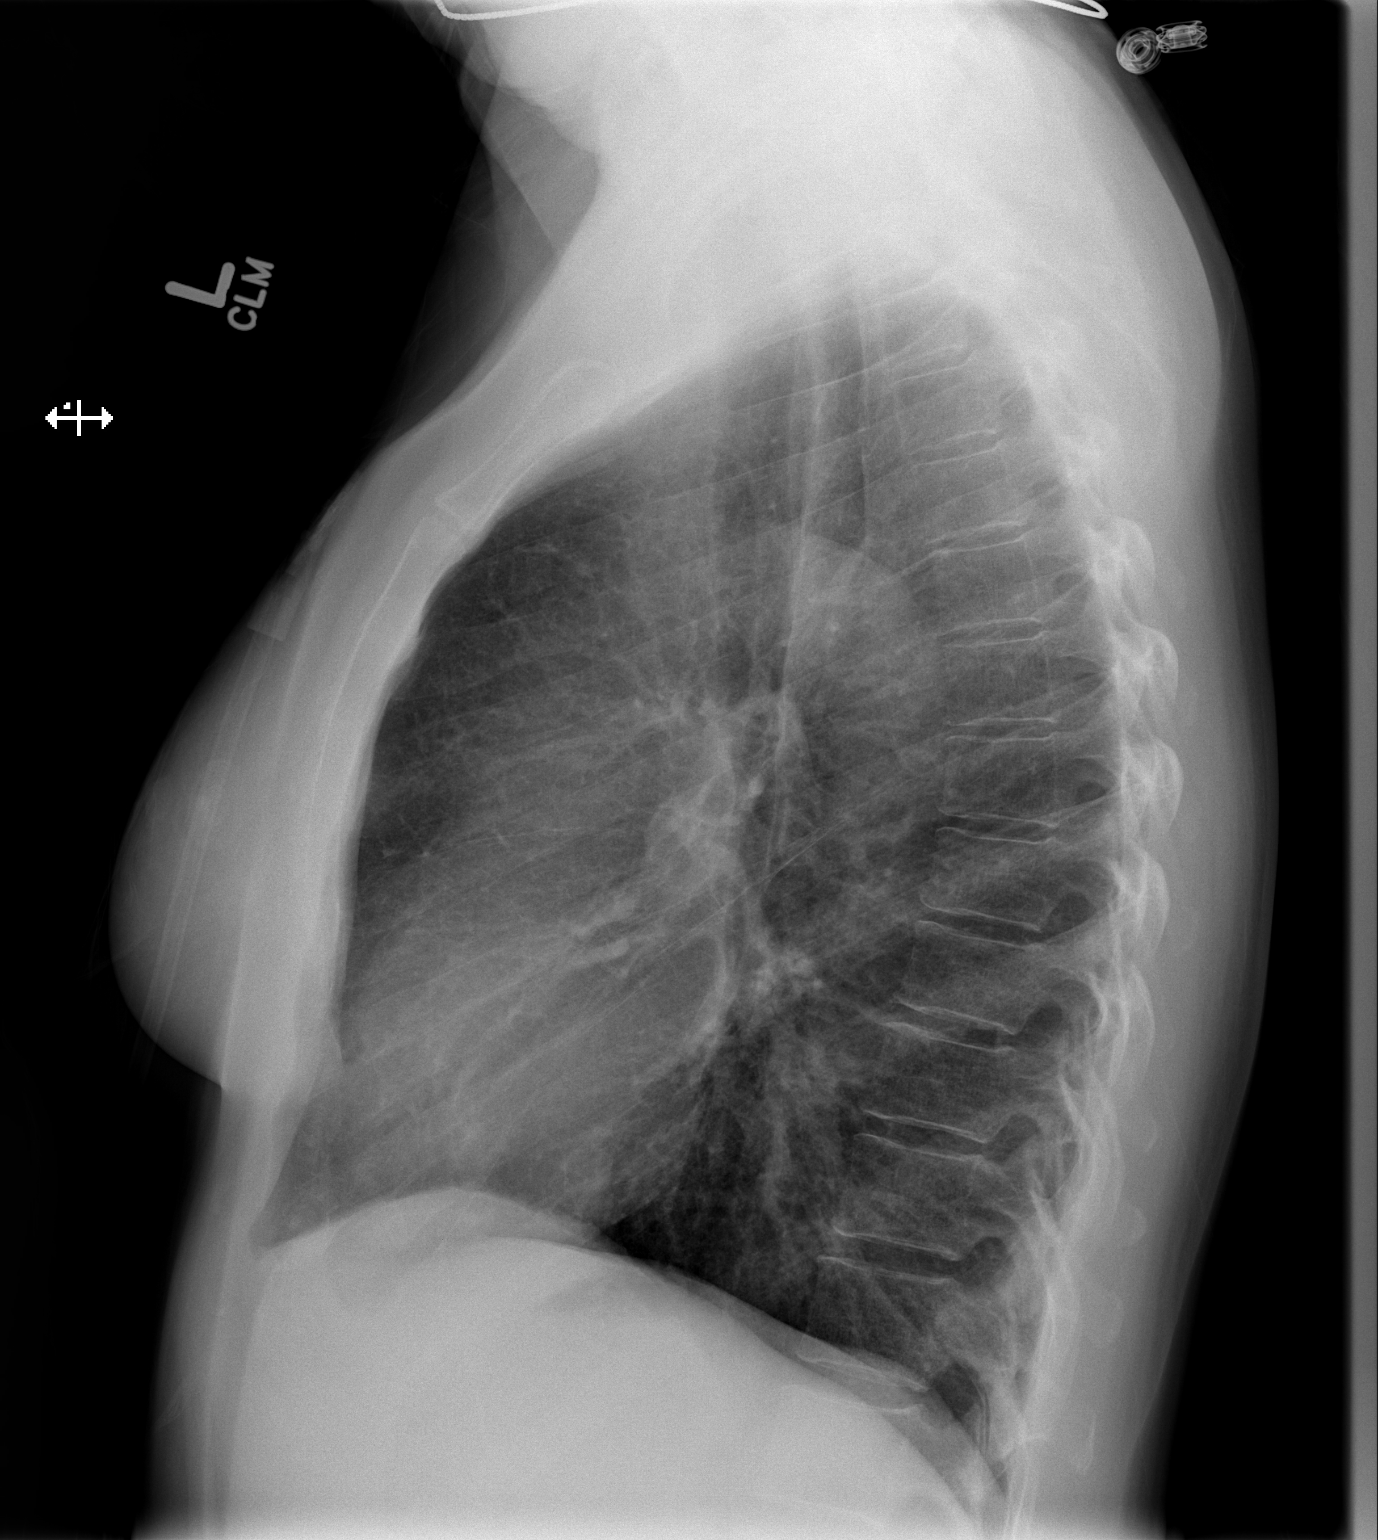

[2 of 2 positions shown; findings below may reference images not displayed]

FINDINGS: Lungs are hyperinflated, clear. No pleural effusion or
pneumothorax. The cardiomediastinal contours are within normal
limits. The visualized bones and soft tissues are without
significant appreciable abnormality.
IMPRESSION: Hyperinflation without focal consolidation.

## 2013-07-12 IMAGING — US US ABDOMEN COMPLETE
1 series · 14 of 25 positions shown · non-contrast
Comparison: Abdomen and pelvis CT, 01/15/2010.

CLINICAL DATA: And, epigastric pain and nausea vomiting

COMPLETE ABDOMINAL ULTRASOUND

[Series 1: us abdomen complete · 0.28mm/px · 14 of 82 slices shown]
[im 1/82]
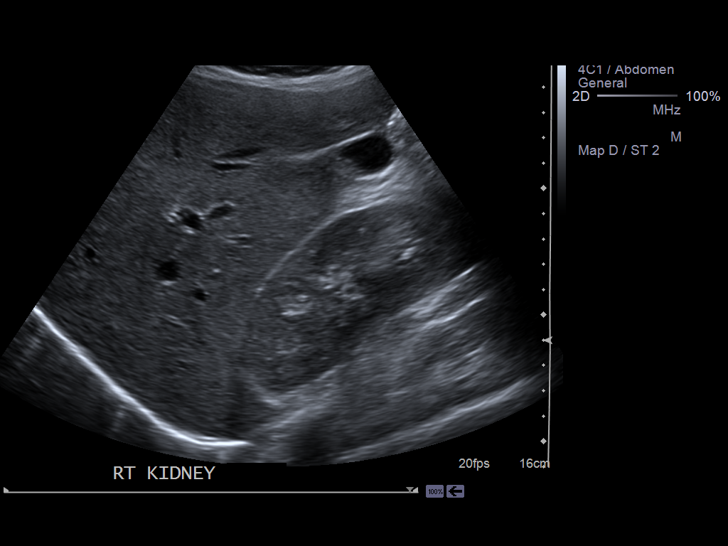
[im 7/82]
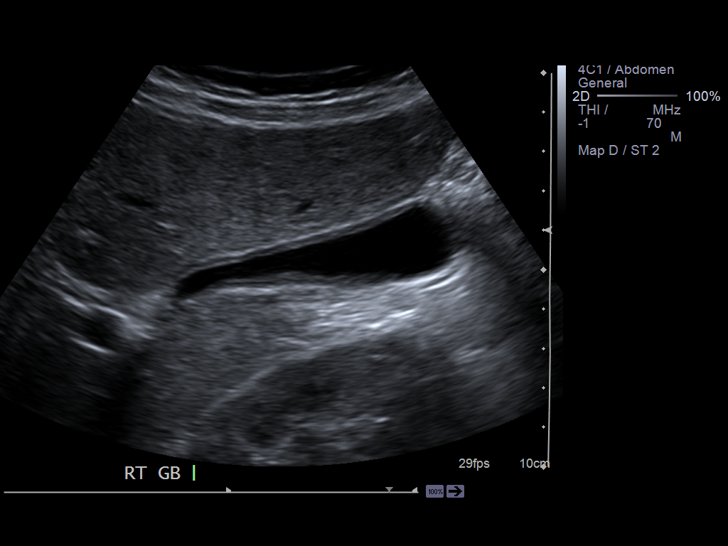
[im 14/82]
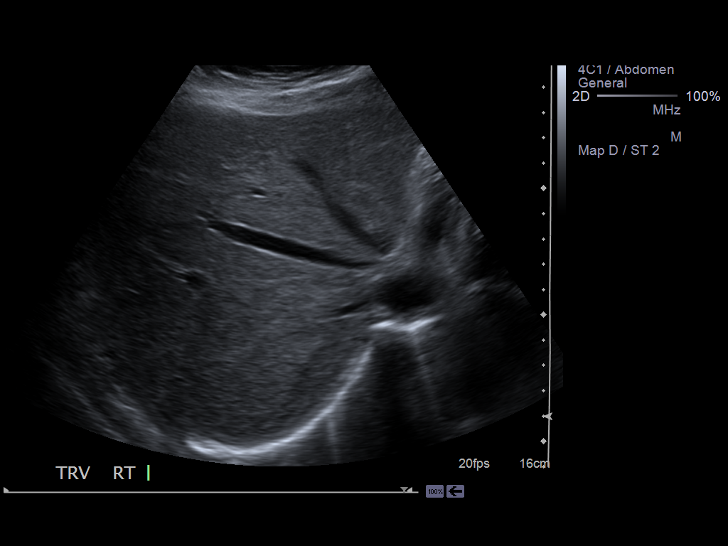
[im 21/82]
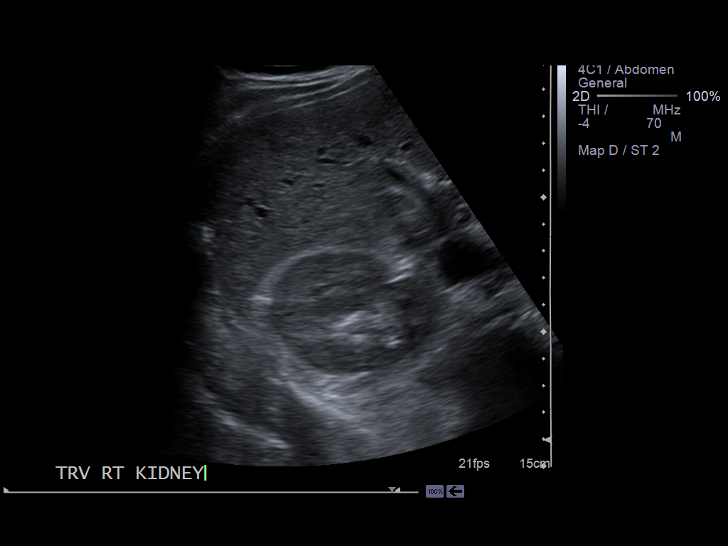
[im 28/82]
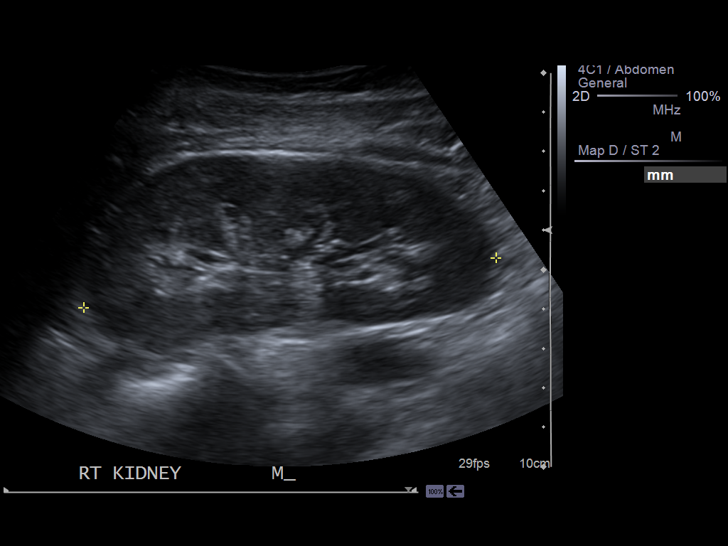
[im 31/82]
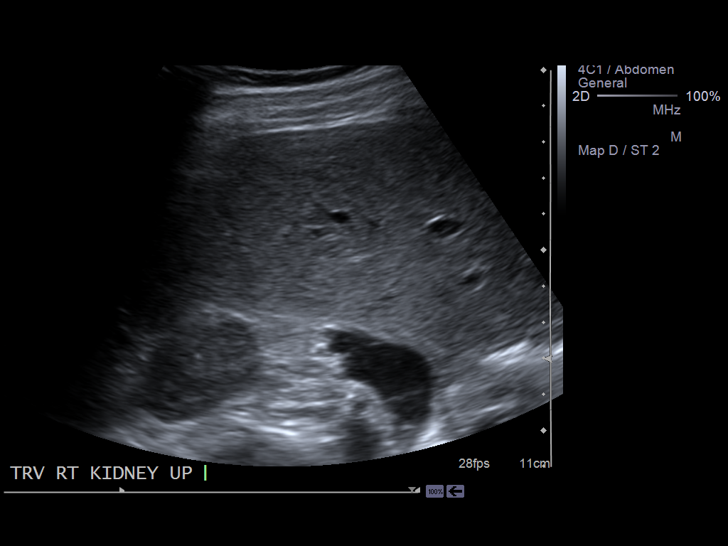
[im 38/82]
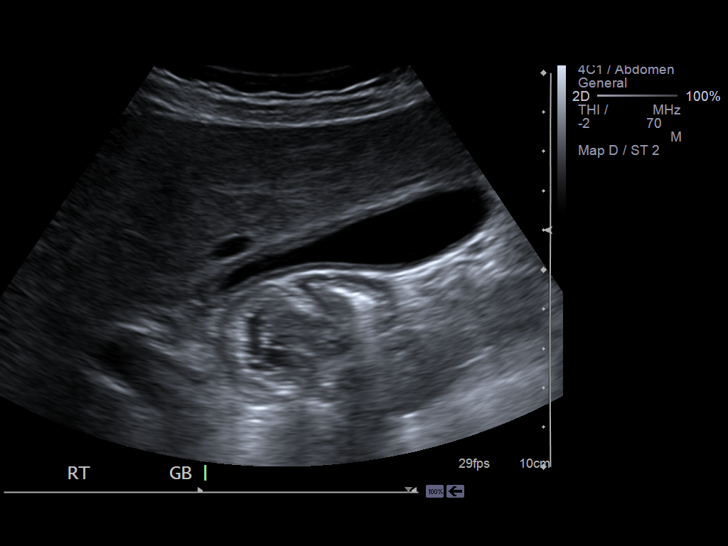
[im 44/82]
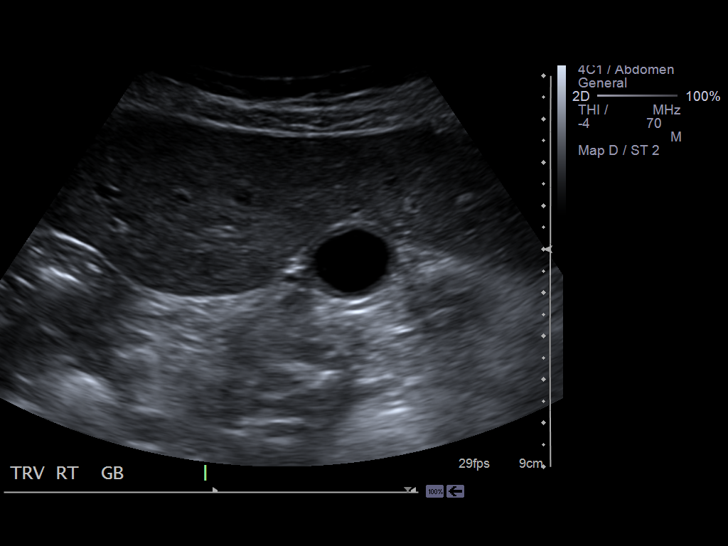
[im 51/82]
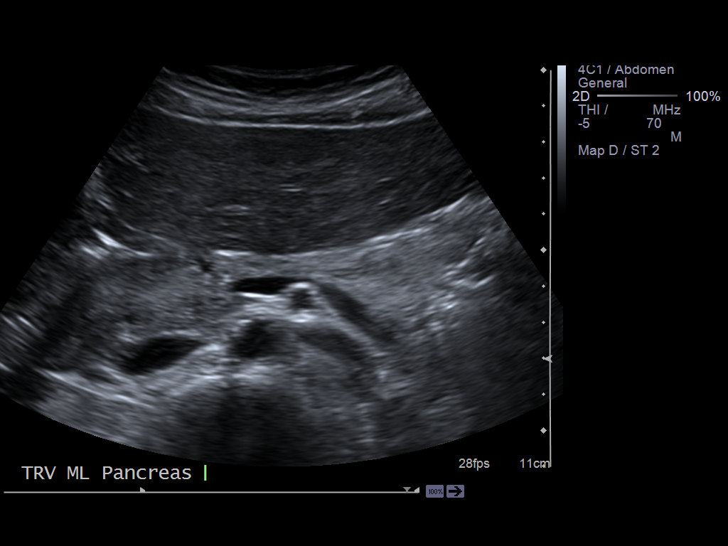
[im 55/82]
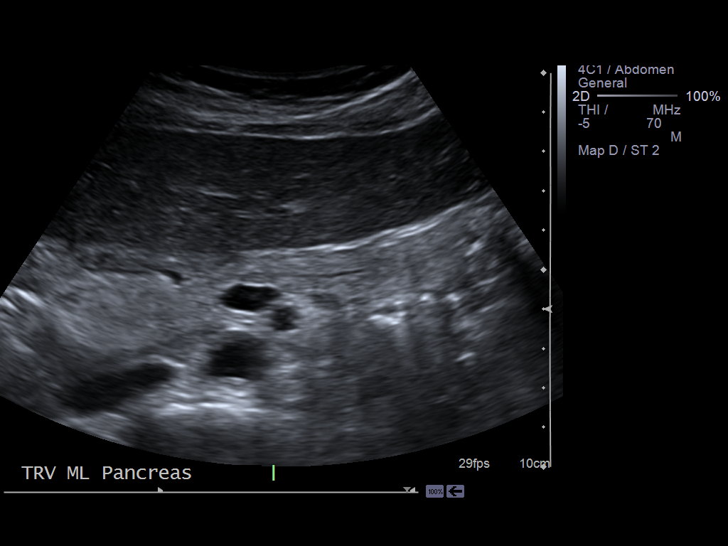
[im 61/82]
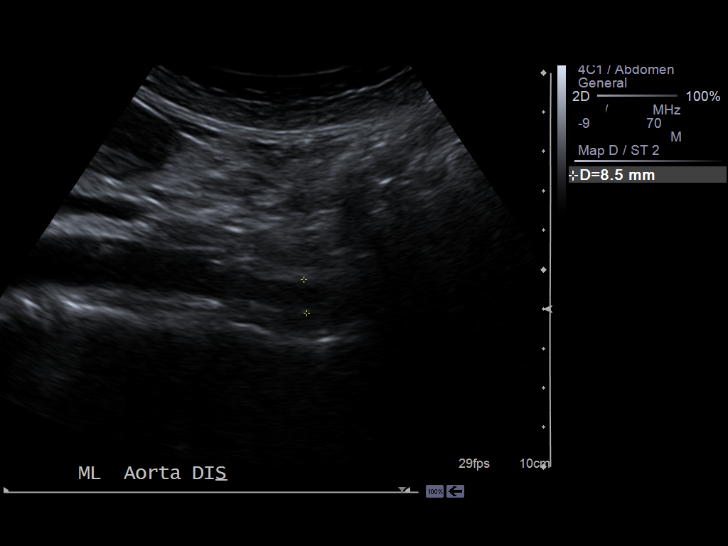
[im 68/82]
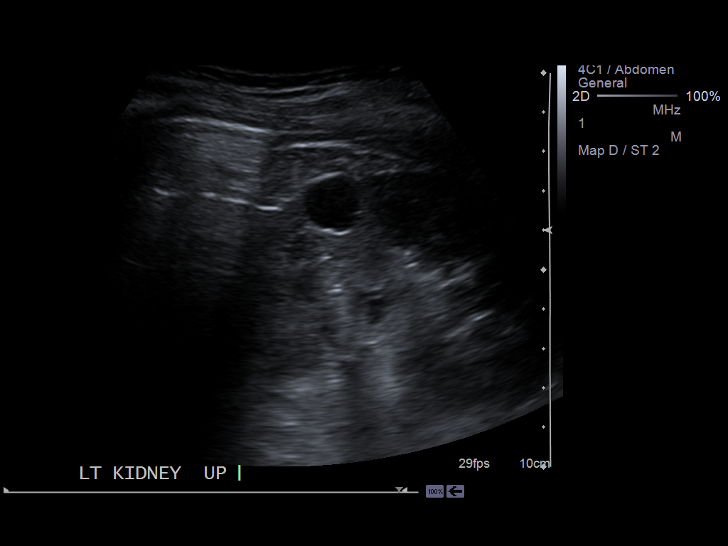
[im 75/82]
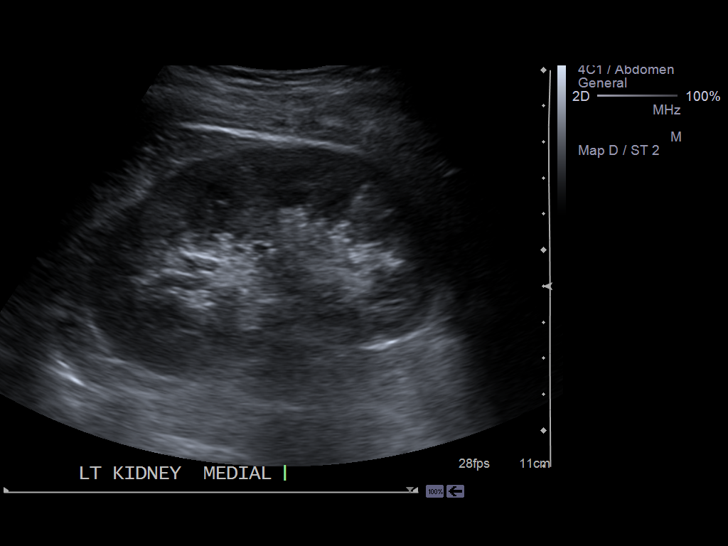
[im 82/82]
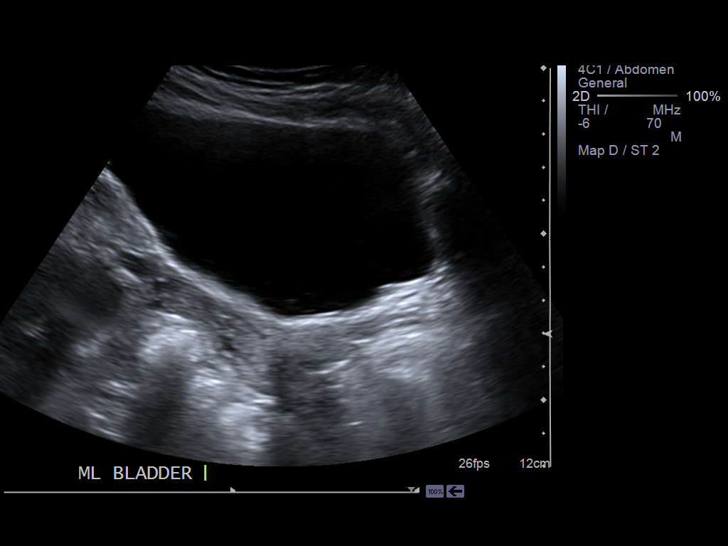

[14 of 25 positions shown; findings below may reference images not displayed]

FINDINGS: Gallbladder:  No gallstones, gallbladder wall thickening, or
pericholecystic fluid.

Common bile duct:  6 mm

Liver:  No focal lesion identified.  Within normal limits in
parenchymal echogenicity.

IVC:  Appears normal.

Pancreas:  Normal in echogenicity with no masses or inflammatory
changes.  No duct dilation.

Spleen:  Normal in size and echogenicity measuring 8.2 cm in
length.

Right Kidney:  Normal in size and echogenicity with no masses,
stones or hydronephrosis.  Measures 10.5 cm in length.

Left Kidney:  15 mm mid pole cyst.  No other renal masses, no
stones and no hydronephrosis.  Measures 10.1 cm in length.

Abdominal aorta:  No aneurysm identified.

The bladder was also evaluated.  It is normal in appearance with no
wall thickening, stones or masses.
IMPRESSION: No acute findings.  Right renal cyst.  Exam otherwise normal.  No
bladder abnormalities or hydronephrosis.  No findings to explain
right flank pain.

## 2013-07-14 IMAGING — CT CT ABD-PELV W/O CM
2 of 4 series · 17 of 46 positions shown, 19 images · non-contrast
Comparison: 01/15/2010

CLINICAL DATA: Right flank pain, evaluate for kidney stone

CT ABDOMEN AND PELVIS WITHOUT CONTRAST
TECHNIQUE: Multidetector CT imaging of the abdomen and pelvis was
performed following the standard protocol without intravenous
contrast.

[Series 2: stone study · axial · 0.71mm/px · z∈[-490,-90]mm · 14 of 88 slices shown, 16 images]
[im 4/88  soft-tissue]
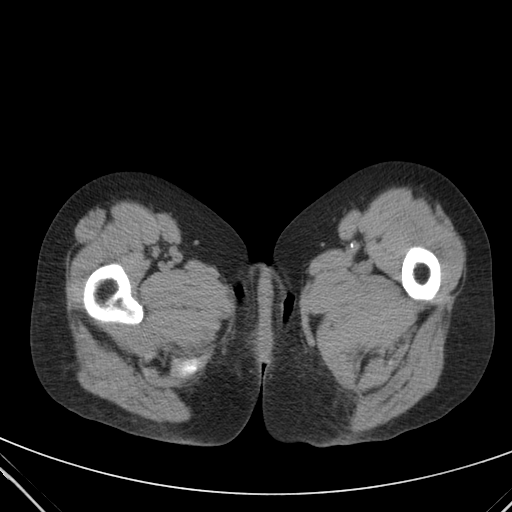
[im 4/88  bone]
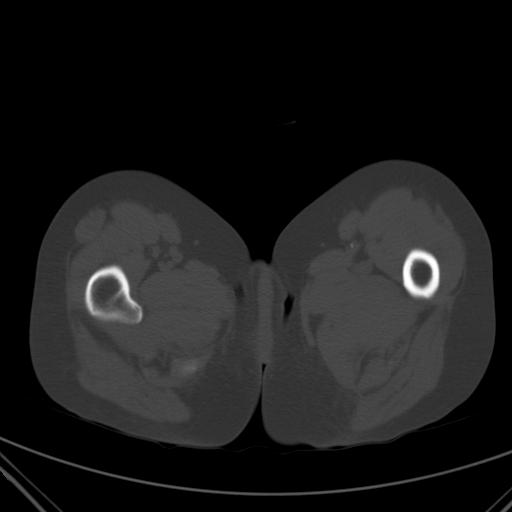
[im 12/88  soft-tissue]
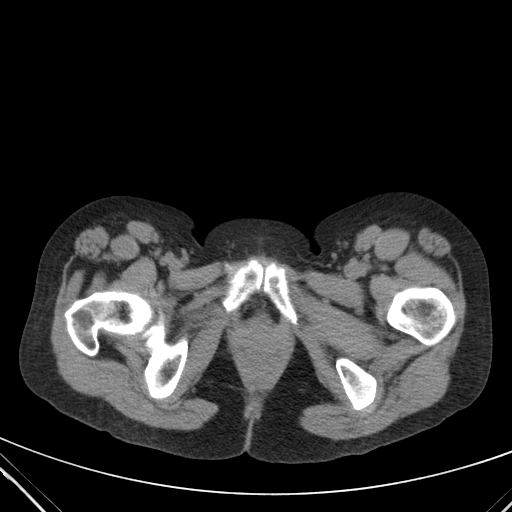
[im 16/88  soft-tissue]
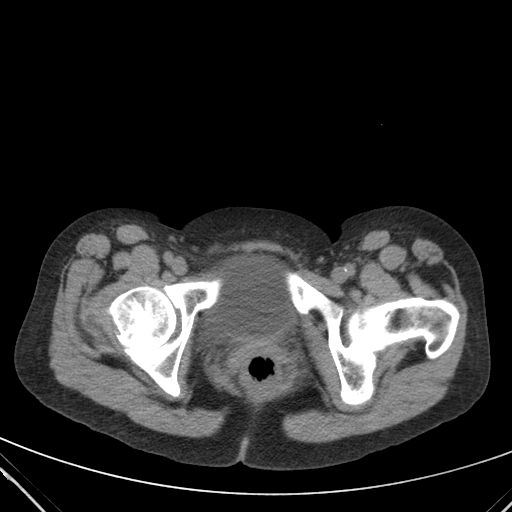
[im 23/88  soft-tissue]
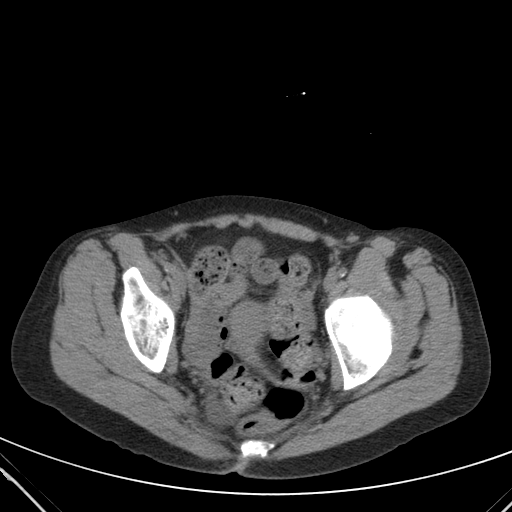
[im 31/88  soft-tissue]
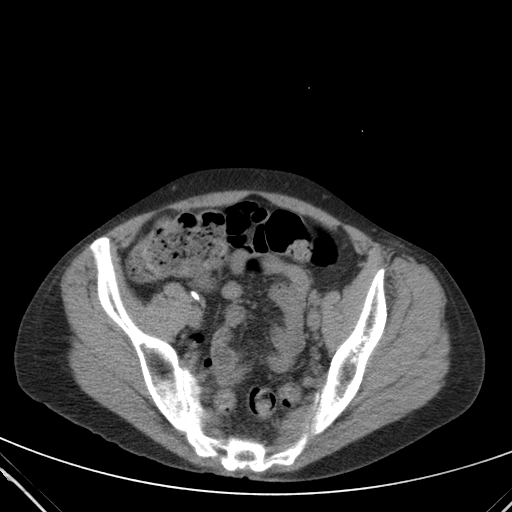
[im 35/88  soft-tissue]
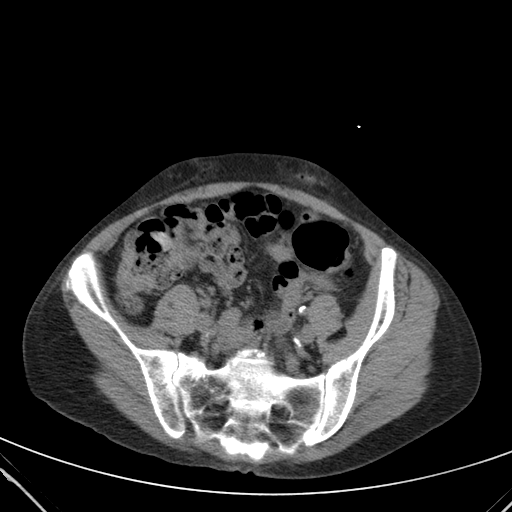
[im 42/88  soft-tissue]
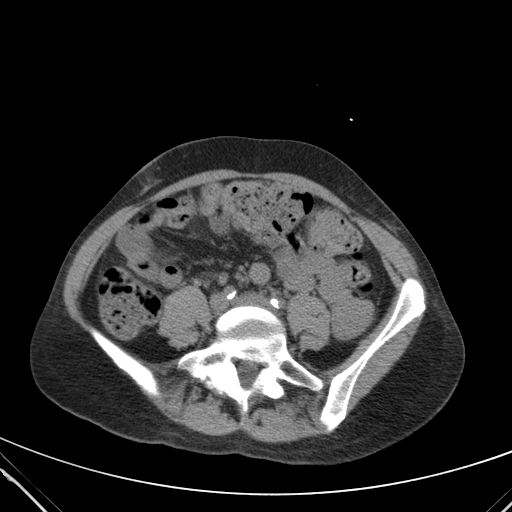
[im 46/88  soft-tissue]
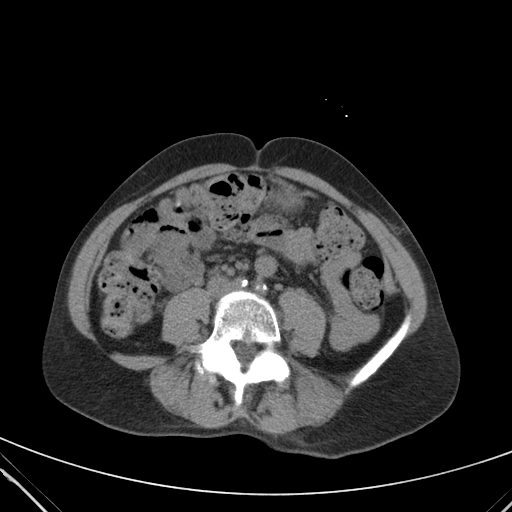
[im 53/88  soft-tissue]
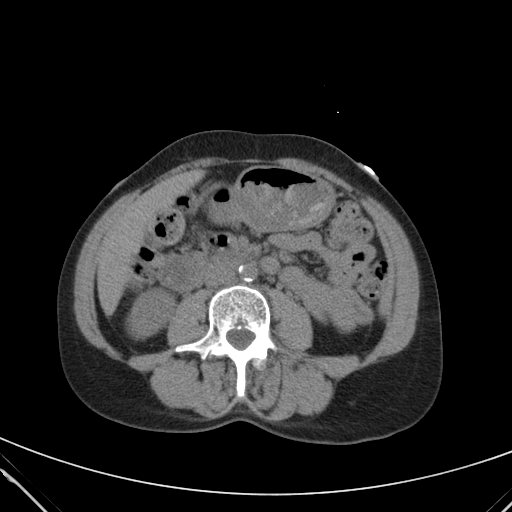
[im 53/88  bone]
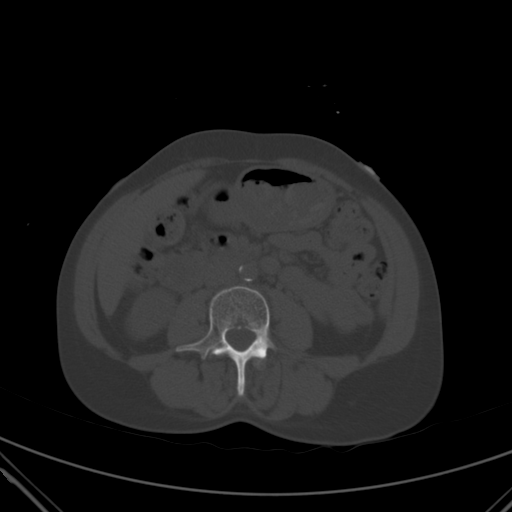
[im 57/88  soft-tissue]
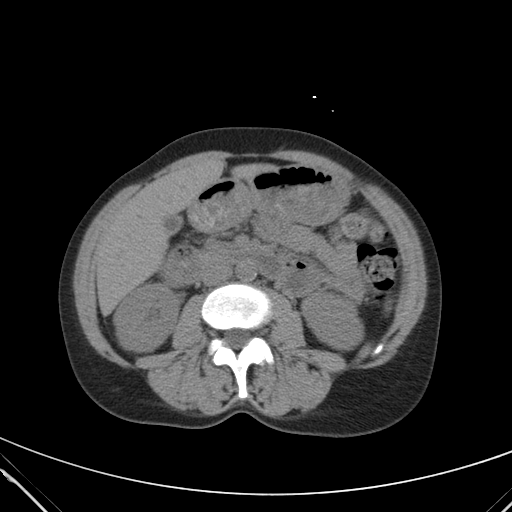
[im 65/88  soft-tissue]
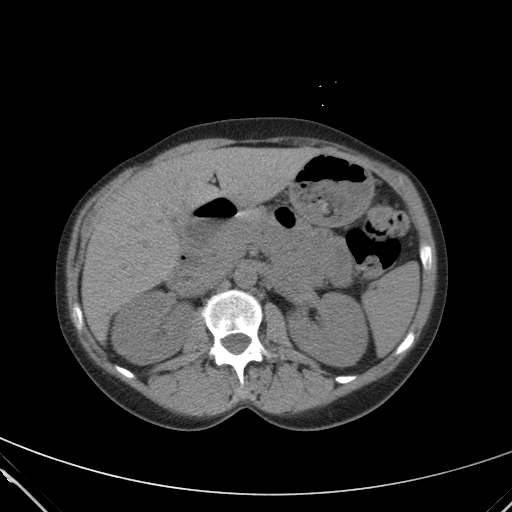
[im 72/88  soft-tissue]
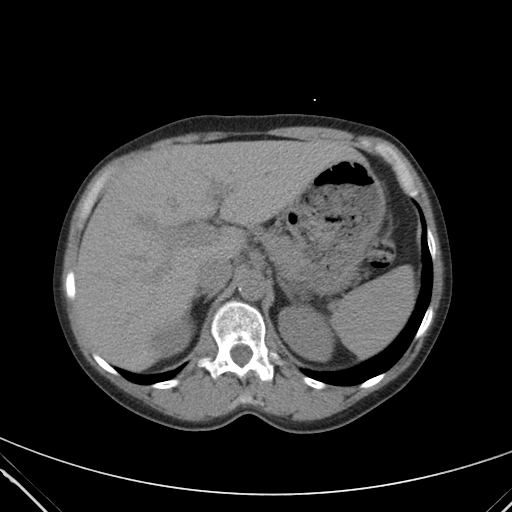
[im 76/88  soft-tissue]
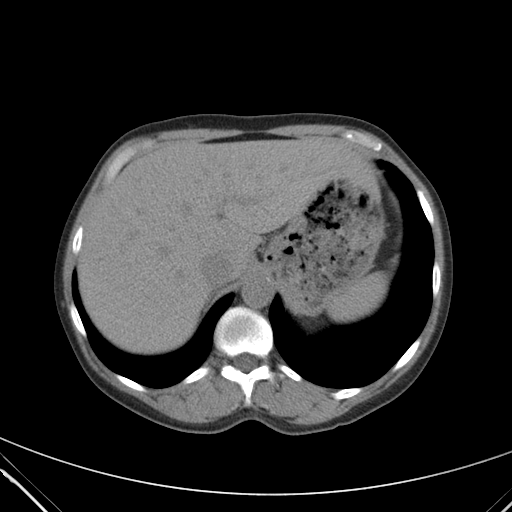
[im 84/88  soft-tissue]
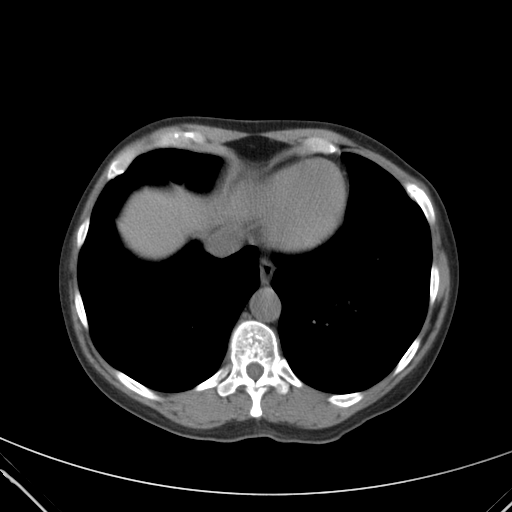

[mpr, coronals, coronal · coronal · 0.85mm/px · 3 of 86 slices shown]
[im 29/86  soft-tissue]
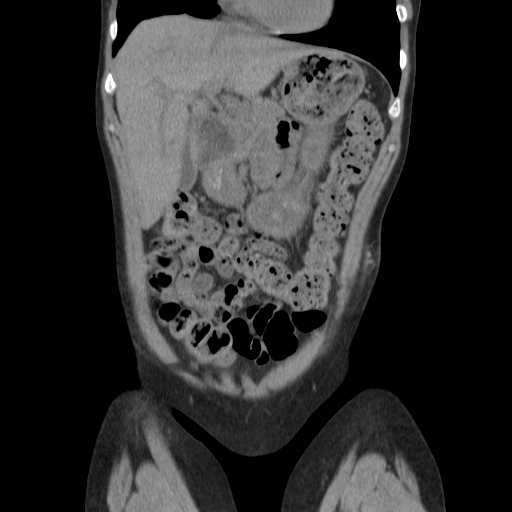
[im 38/86  soft-tissue]
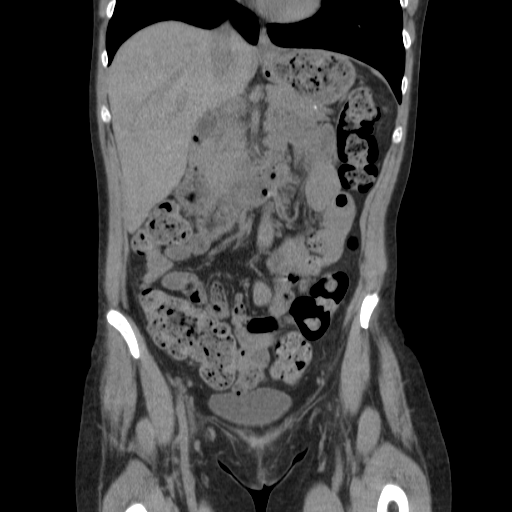
[im 48/86  soft-tissue]
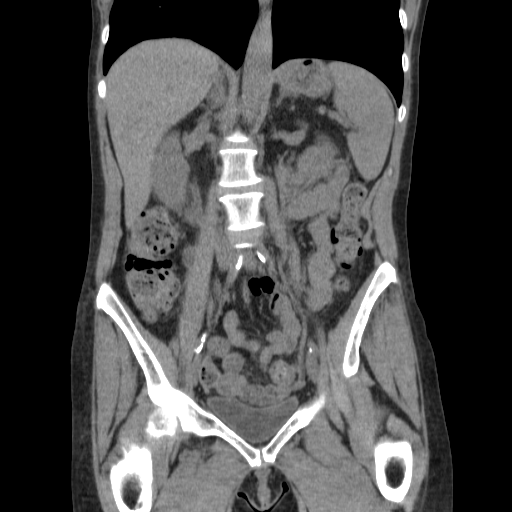

[17 of 46 positions shown; findings below may reference images not displayed]

FINDINGS: Lung bases are essentially clear.

Unenhanced liver, spleen, pancreas, and adrenal glands are within
normal limits.

Gallbladder is underdistended.  No intrahepatic or extrahepatic
ductal dilatation.

1.3 cm left renal cyst (series 2/image 21).  No renal calculi or
hydronephrosis.

No evidence of bowel obstruction.

Atherosclerotic calcifications of the abdominal aorta and branch
vessels.

No suspicious abdominopelvic lymphadenopathy.

Trace pelvic ascites, likely physiologic.

Uterus is notable for an IUD in satisfactory position.  No adnexal
masses.

No ureteral or bladder calculi.

Visualized osseous structures are within normal limits.
Transitional vertebra at L5/S1.
IMPRESSION: No renal, ureteral, or bladder calculi.  No hydronephrosis.

1.3 cm left renal cyst.

IUD in satisfactory position.

## 2013-07-25 ENCOUNTER — Encounter: Payer: Self-pay | Admitting: Internal Medicine

## 2013-07-31 ENCOUNTER — Other Ambulatory Visit (HOSPITAL_COMMUNITY): Payer: Self-pay | Admitting: Urology

## 2013-07-31 DIAGNOSIS — N390 Urinary tract infection, site not specified: Secondary | ICD-10-CM

## 2013-08-08 ENCOUNTER — Ambulatory Visit (HOSPITAL_COMMUNITY)
Admission: RE | Admit: 2013-08-08 | Discharge: 2013-08-08 | Disposition: A | Discharge status: Home / Self Care | Payer: No Typology Code available for payment source | Source: Ambulatory Visit | Attending: Urology | Admitting: Urology

## 2013-08-08 DIAGNOSIS — N390 Urinary tract infection, site not specified: Secondary | ICD-10-CM | POA: Insufficient documentation

## 2013-08-08 MED ORDER — DIATRIZOATE MEGLUMINE 30 % UR SOLN
Freq: Once | URETHRAL | Status: AC | PRN
  Administered 2013-08-08: 375 mL

## 2013-08-17 ENCOUNTER — Other Ambulatory Visit (HOSPITAL_COMMUNITY): Payer: Self-pay | Admitting: Urology

## 2013-08-17 DIAGNOSIS — N3020 Other chronic cystitis without hematuria: Secondary | ICD-10-CM

## 2013-08-17 DIAGNOSIS — N390 Urinary tract infection, site not specified: Secondary | ICD-10-CM

## 2013-08-22 ENCOUNTER — Ambulatory Visit (INDEPENDENT_AMBULATORY_CARE_PROVIDER_SITE_OTHER): Payer: No Typology Code available for payment source | Admitting: Internal Medicine

## 2013-08-22 ENCOUNTER — Encounter: Payer: Self-pay | Admitting: Internal Medicine

## 2013-08-22 ENCOUNTER — Telehealth: Payer: Self-pay | Admitting: *Deleted

## 2013-08-22 ENCOUNTER — Encounter (HOSPITAL_COMMUNITY): Payer: Self-pay

## 2013-08-22 ENCOUNTER — Ambulatory Visit (HOSPITAL_COMMUNITY)
Admission: RE | Admit: 2013-08-22 | Discharge: 2013-08-22 | Disposition: A | Discharge status: Home / Self Care | Payer: No Typology Code available for payment source | Source: Ambulatory Visit | Attending: Urology | Admitting: Urology

## 2013-08-22 VITALS — BP 166/88 | HR 87 | Temp 97.6°F | Ht 63.0 in | Wt 133.8 lb

## 2013-08-22 DIAGNOSIS — N390 Urinary tract infection, site not specified: Secondary | ICD-10-CM | POA: Insufficient documentation

## 2013-08-22 DIAGNOSIS — N3020 Other chronic cystitis without hematuria: Secondary | ICD-10-CM

## 2013-08-22 DIAGNOSIS — R911 Solitary pulmonary nodule: Secondary | ICD-10-CM | POA: Insufficient documentation

## 2013-08-22 DIAGNOSIS — I708 Atherosclerosis of other arteries: Secondary | ICD-10-CM | POA: Insufficient documentation

## 2013-08-22 DIAGNOSIS — M4802 Spinal stenosis, cervical region: Secondary | ICD-10-CM | POA: Insufficient documentation

## 2013-08-22 DIAGNOSIS — M542 Cervicalgia: Secondary | ICD-10-CM

## 2013-08-22 DIAGNOSIS — I10 Essential (primary) hypertension: Secondary | ICD-10-CM

## 2013-08-22 DIAGNOSIS — I700 Atherosclerosis of aorta: Secondary | ICD-10-CM | POA: Insufficient documentation

## 2013-08-22 DIAGNOSIS — N281 Cyst of kidney, acquired: Secondary | ICD-10-CM | POA: Insufficient documentation

## 2013-08-22 DIAGNOSIS — A498 Other bacterial infections of unspecified site: Secondary | ICD-10-CM | POA: Insufficient documentation

## 2013-08-22 MED ORDER — IOHEXOL 300 MG/ML  SOLN
100.0000 mL | Freq: Once | INTRAMUSCULAR | Status: AC | PRN
  Administered 2013-08-22: 100 mL via INTRAVENOUS

## 2013-08-22 MED ORDER — ETODOLAC 200 MG PO CAPS: ORAL_CAPSULE | ORAL | Status: DC

## 2013-08-22 NOTE — Telephone Encounter (Signed)
Thanks Agree 

## 2013-08-22 NOTE — Telephone Encounter (Signed)
Pt calls and states that her lymph nodes in both sides of neck are swollen, painful, left started Sunday, right today. appt dr Eyvonne Mechanic at 1545, 1600- on way now

## 2013-08-22 NOTE — Assessment & Plan Note (Signed)
BP slightly elevated in the setting of neck pain.  Plans: Continue current medications. Will monitor.

## 2013-08-22 NOTE — Assessment & Plan Note (Signed)
Bilateral neck pain along the line of anterior cervical LN's without appreciable palpable cervical LN's. Patient also has posterior pharyngeal erythema and tonsillar enlargement Suspect infectious or allergic etiology given the duration of 3 days. Discussed with the attending regarding further management and plan.  Plans: Conservative management with Etodolac - as an anti-inflammatory and analgesic. No clinical signs of bacterial co-infection currently. Recommended to follow up with her dentist. Follow up as needed or if symptoms persist/worsen.

## 2013-08-22 NOTE — Patient Instructions (Signed)
Take the Etodolac as prescribed. Follow up with your dentist. If your pain persists or worsens, please inform us or seek medical help. Take all the other medications as recommended. If your symptoms persist after 2 weeks, please make an appointment with our office.
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
## 2013-08-24 NOTE — Progress Notes (Signed)
Case discussed with Dr. Eyvonne Mechanic at time of visit.  We reviewed the resident's history and exam and pertinent patient test results.  I agree with the assessment, diagnosis, and plan of care documented in the resident's note.

## 2013-10-09 ENCOUNTER — Emergency Department (HOSPITAL_COMMUNITY): Payer: Self-pay

## 2013-10-09 ENCOUNTER — Emergency Department (HOSPITAL_COMMUNITY)
Admission: EM | Admit: 2013-10-09 | Discharge: 2013-10-09 | Disposition: A | Discharge status: Home / Self Care | Payer: No Typology Code available for payment source | Attending: Emergency Medicine | Admitting: Emergency Medicine

## 2013-10-09 ENCOUNTER — Encounter (HOSPITAL_COMMUNITY): Payer: Self-pay | Admitting: Emergency Medicine

## 2013-10-09 DIAGNOSIS — Y9389 Activity, other specified: Secondary | ICD-10-CM | POA: Insufficient documentation

## 2013-10-09 DIAGNOSIS — S199XXA Unspecified injury of neck, initial encounter: Secondary | ICD-10-CM

## 2013-10-09 DIAGNOSIS — S0993XA Unspecified injury of face, initial encounter: Secondary | ICD-10-CM | POA: Insufficient documentation

## 2013-10-09 DIAGNOSIS — M542 Cervicalgia: Secondary | ICD-10-CM

## 2013-10-09 DIAGNOSIS — Z87440 Personal history of urinary (tract) infections: Secondary | ICD-10-CM | POA: Insufficient documentation

## 2013-10-09 DIAGNOSIS — Y9289 Other specified places as the place of occurrence of the external cause: Secondary | ICD-10-CM | POA: Insufficient documentation

## 2013-10-09 DIAGNOSIS — M546 Pain in thoracic spine: Secondary | ICD-10-CM

## 2013-10-09 DIAGNOSIS — Z8639 Personal history of other endocrine, nutritional and metabolic disease: Secondary | ICD-10-CM | POA: Insufficient documentation

## 2013-10-09 DIAGNOSIS — F3289 Other specified depressive episodes: Secondary | ICD-10-CM | POA: Insufficient documentation

## 2013-10-09 DIAGNOSIS — G8929 Other chronic pain: Secondary | ICD-10-CM | POA: Insufficient documentation

## 2013-10-09 DIAGNOSIS — I10 Essential (primary) hypertension: Secondary | ICD-10-CM | POA: Insufficient documentation

## 2013-10-09 DIAGNOSIS — S4980XA Other specified injuries of shoulder and upper arm, unspecified arm, initial encounter: Secondary | ICD-10-CM | POA: Insufficient documentation

## 2013-10-09 DIAGNOSIS — Z8589 Personal history of malignant neoplasm of other organs and systems: Secondary | ICD-10-CM | POA: Insufficient documentation

## 2013-10-09 DIAGNOSIS — F17200 Nicotine dependence, unspecified, uncomplicated: Secondary | ICD-10-CM | POA: Insufficient documentation

## 2013-10-09 DIAGNOSIS — Z862 Personal history of diseases of the blood and blood-forming organs and certain disorders involving the immune mechanism: Secondary | ICD-10-CM | POA: Insufficient documentation

## 2013-10-09 DIAGNOSIS — M549 Dorsalgia, unspecified: Secondary | ICD-10-CM | POA: Insufficient documentation

## 2013-10-09 DIAGNOSIS — I2510 Atherosclerotic heart disease of native coronary artery without angina pectoris: Secondary | ICD-10-CM | POA: Insufficient documentation

## 2013-10-09 DIAGNOSIS — IMO0002 Reserved for concepts with insufficient information to code with codable children: Secondary | ICD-10-CM | POA: Insufficient documentation

## 2013-10-09 DIAGNOSIS — I252 Old myocardial infarction: Secondary | ICD-10-CM | POA: Insufficient documentation

## 2013-10-09 DIAGNOSIS — Z8719 Personal history of other diseases of the digestive system: Secondary | ICD-10-CM | POA: Insufficient documentation

## 2013-10-09 DIAGNOSIS — Z8673 Personal history of transient ischemic attack (TIA), and cerebral infarction without residual deficits: Secondary | ICD-10-CM | POA: Insufficient documentation

## 2013-10-09 DIAGNOSIS — F411 Generalized anxiety disorder: Secondary | ICD-10-CM | POA: Insufficient documentation

## 2013-10-09 DIAGNOSIS — S46909A Unspecified injury of unspecified muscle, fascia and tendon at shoulder and upper arm level, unspecified arm, initial encounter: Secondary | ICD-10-CM | POA: Insufficient documentation

## 2013-10-09 DIAGNOSIS — F329 Major depressive disorder, single episode, unspecified: Secondary | ICD-10-CM | POA: Insufficient documentation

## 2013-10-09 MED ORDER — HYDROCODONE-ACETAMINOPHEN 5-325 MG PO TABS: 1.0000 | ORAL_TABLET | Freq: Four times a day (QID) | ORAL | Status: DC | PRN

## 2013-10-09 MED ORDER — OXYCODONE-ACETAMINOPHEN 5-325 MG PO TABS
2.0000 | ORAL_TABLET | Freq: Once | ORAL | Status: AC
  Administered 2013-10-09: 2 via ORAL
  Filled 2013-10-09: qty 2

## 2013-10-09 NOTE — ED Notes (Signed)
Called the social worker to find a place for them to live.
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
## 2013-10-09 NOTE — Discharge Instructions (Signed)
Take pain medication as prescribed for pain.  Do not drive or operate heavy machinery for 4-6 hours after taking medication.

## 2013-10-11 NOTE — ED Provider Notes (Signed)
Medical screening examination/treatment/procedure(s) were performed by non-physician practitioner and as supervising physician I was immediately available for consultation/collaboration.   Houston Siren III, MD 10/11/13 1041
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
## 2013-10-24 ENCOUNTER — Encounter: Payer: Self-pay | Admitting: Internal Medicine

## 2013-10-24 ENCOUNTER — Ambulatory Visit (INDEPENDENT_AMBULATORY_CARE_PROVIDER_SITE_OTHER): Payer: No Typology Code available for payment source | Admitting: Internal Medicine

## 2013-10-24 VITALS — BP 216/115 | HR 96 | Temp 97.1°F | Ht 63.0 in | Wt 135.3 lb

## 2013-10-24 DIAGNOSIS — I10 Essential (primary) hypertension: Secondary | ICD-10-CM

## 2013-10-24 DIAGNOSIS — I2510 Atherosclerotic heart disease of native coronary artery without angina pectoris: Secondary | ICD-10-CM

## 2013-10-24 DIAGNOSIS — G8929 Other chronic pain: Secondary | ICD-10-CM

## 2013-10-24 DIAGNOSIS — M549 Dorsalgia, unspecified: Secondary | ICD-10-CM

## 2013-10-24 DIAGNOSIS — F3110 Bipolar disorder, current episode manic without psychotic features, unspecified: Secondary | ICD-10-CM

## 2013-10-24 DIAGNOSIS — R319 Hematuria, unspecified: Secondary | ICD-10-CM

## 2013-10-24 MED ORDER — SIMVASTATIN 20 MG PO TABS: 20.0000 mg | ORAL_TABLET | Freq: Every evening | ORAL | Status: DC

## 2013-10-24 MED ORDER — DIVALPROEX SODIUM 250 MG PO DR TAB: 500.0000 mg | DELAYED_RELEASE_TABLET | Freq: Two times a day (BID) | ORAL | Status: DC

## 2013-10-24 MED ORDER — SERTRALINE HCL 50 MG PO TABS: 75.0000 mg | ORAL_TABLET | Freq: Every day | ORAL | Status: DC

## 2013-10-24 MED ORDER — ASPIRIN EC 81 MG PO TBEC: 81.0000 mg | DELAYED_RELEASE_TABLET | Freq: Every day | ORAL | Status: DC

## 2013-10-24 MED ORDER — METOPROLOL TARTRATE 25 MG PO TABS: 25.0000 mg | ORAL_TABLET | Freq: Two times a day (BID) | ORAL | Status: DC

## 2013-10-24 MED ORDER — MIRTAZAPINE 30 MG PO TABS: 30.0000 mg | ORAL_TABLET | Freq: Every day | ORAL | Status: DC

## 2013-10-24 MED ORDER — TRAMADOL HCL 50 MG PO TABS: 50.0000 mg | ORAL_TABLET | Freq: Four times a day (QID) | ORAL | Status: DC | PRN

## 2013-10-24 MED ORDER — LISINOPRIL 5 MG PO TABS: 5.0000 mg | ORAL_TABLET | Freq: Every day | ORAL | Status: DC

## 2013-10-24 MED ORDER — NITROGLYCERIN 0.4 MG SL SUBL
0.4000 mg | SUBLINGUAL_TABLET | SUBLINGUAL | Status: DC | PRN
Start: 2013-10-24 — End: 2013-12-27

## 2013-10-24 NOTE — Assessment & Plan Note (Signed)
She needs to re-establish with [facility] given info for psychiatric medications  Advised will not refill Klonopin Given temporary refills of Depakote 500 mg bid, Remeron 30 mg qhs, Zoloft 75 mg qd

## 2013-10-24 NOTE — Patient Instructions (Addendum)
General Instructions: Please follow up in 1 week  Get your medications filled Please follow up with pain clinic and [facility] for psychiatric medications  Please get an MRI of your cervical and lower back  Take Tramadol 50 mg every 6 hours as need for pain for 2 weeks  Take care    Treatment Goals:  Goals (1 Years of Data) as of 10/24/13         As of Today As of Today 10/09/13 10/09/13 08/22/13     Blood Pressure    . Blood Pressure < 140/90  216/115 192/96 167/97 164/91 166/88      Progress Toward Treatment Goals:  Treatment Goal 10/24/2013  Blood pressure deteriorated  Stop smoking -  Prevent falls -    Self Care Goals & Plans:  Self Care Goal 10/24/2013  Manage my medications take my medicines as prescribed; bring my medications to every visit; refill my medications on time  Monitor my health keep track of my blood pressure  Eat healthy foods drink diet soda or water instead of juice or soda; eat more vegetables; eat foods that are low in salt; eat baked foods instead of fried foods; eat fruit for snacks and desserts; eat smaller portions  Be physically active find an activity I enjoy  Stop smoking -  Prevent falls -  Meeting treatment goals maintain the current self-care plan    No flowsheet data found.   Care Management & Community Referrals:  Referral 10/24/2013  Referrals made for care management support -  Referrals made to community resources none      Back Pain, Adult Low back pain is very common. About 1 in 5 people have back pain.The cause of low back pain is rarely dangerous. The pain often gets better over time.About half of people with a sudden onset of back pain feel better in just 2 weeks. About 8 in 10 people feel better by 6 weeks.  CAUSES Some common causes of back pain include:  Strain of the muscles or ligaments supporting the spine.  Wear and tear (degeneration) of the spinal discs.  Arthritis.  Direct injury to the back. DIAGNOSIS Most  of the time, the direct cause of low back pain is not known.However, back pain can be treated effectively even when the exact cause of the pain is unknown.Answering your caregiver's questions about your overall health and symptoms is one of the most accurate ways to make sure the cause of your pain is not dangerous. If your caregiver needs more information, he or she may order lab work or imaging tests (X-rays or MRIs).However, even if imaging tests show changes in your back, this usually does not require surgery. HOME CARE INSTRUCTIONS For many people, back pain returns.Since low back pain is rarely dangerous, it is often a condition that people can learn to [facility] their own.   Remain active. It is stressful on the back to sit or stand in one place. Do not sit, drive, or stand in one place for more than 30 minutes at a time. Take short walks on level surfaces as soon as pain allows.Try to increase the length of time you walk each day.  Do not stay in bed.Resting more than 1 or 2 days can delay your recovery.  Do not avoid exercise or work.Your body is made to move.It is not dangerous to be active, even though your back may hurt.Your back will likely heal faster if you return to being active before your pain is  gone.  Pay attention to your body when you bend and lift. Many people have less discomfortwhen lifting if they bend their knees, keep the load close to their bodies,and avoid twisting. Often, the most comfortable positions are those that put less stress on your recovering back.  Find a comfortable position to sleep. Use a firm mattress and lie on your side with your knees slightly bent. If you lie on your back, put a pillow under your knees.  Only take over-the-counter or prescription medicines as directed by your caregiver. Over-the-counter medicines to reduce pain and inflammation are often the most helpful.Your caregiver may prescribe muscle relaxant drugs.These medicines  help dull your pain so you can more quickly return to your normal activities and healthy exercise.  Put ice on the injured area.  Put ice in a plastic bag.  Place a towel between your skin and the bag.  Leave the ice on for 15-20 minutes, 03-04 times a day for the first 2 to 3 days. After that, ice and heat may be alternated to reduce pain and spasms.  Ask your caregiver about trying back exercises and gentle massage. This may be of some benefit.  Avoid feeling anxious or stressed.Stress increases muscle tension and can worsen back pain.It is important to recognize when you are anxious or stressed and learn ways to manage it.Exercise is a great option. SEEK MEDICAL CARE IF:  You have pain that is not relieved with rest or medicine.  You have pain that does not improve in 1 week.  You have new symptoms.  You are generally not feeling well. SEEK IMMEDIATE MEDICAL CARE IF:   You have pain that radiates from your back into your legs.  You develop new bowel or bladder control problems.  You have unusual weakness or numbness in your arms or legs.  You develop nausea or vomiting.  You develop abdominal pain.  You feel faint. Document Released: 03/30/2005 Document Revised: 09/29/2011 Document Reviewed: 08/18/2010 [facility] Patient Information 2015 [place], [place]. This information is not intended to replace advice given to you by your health care provider. Make sure you discuss any questions you have with your health care provider.  Hypertension Hypertension, commonly called high blood pressure, is when the force of blood pumping through your arteries is too strong. Your arteries are the blood vessels that carry blood from your heart throughout your body. A blood pressure reading consists of a higher number over a lower number, such as 110/72. The higher number (systolic) is the pressure inside your arteries when your heart pumps. The lower number (diastolic) is the pressure inside  your arteries when your heart relaxes. Ideally you want your blood pressure below 120/80. Hypertension forces your heart to work harder to pump blood. Your arteries may become narrow or stiff. Having hypertension puts you at risk for heart disease, stroke, and other problems.  RISK FACTORS Some risk factors for high blood pressure are controllable. Others are not.  Risk factors you cannot control include:   Race. You may be at higher risk if you are African American.  Age. Risk increases with age.  Gender. Men are at higher risk than women before age 24 years. After age 82, women are at higher risk than men. Risk factors you can control include:  Not getting enough exercise or physical activity.  Being overweight.  Getting too much fat, sugar, calories, or salt in your diet.  Drinking too much alcohol. SIGNS AND SYMPTOMS Hypertension does not usually cause signs  or symptoms. Extremely high blood pressure (hypertensive crisis) may cause headache, anxiety, shortness of breath, and nosebleed. DIAGNOSIS  To check if you have hypertension, your health care provider will measure your blood pressure while you are seated, with your arm held at the level of your heart. It should be measured at least twice using the same arm. Certain conditions can cause a difference in blood pressure between your right and left arms. A blood pressure reading that is higher than normal on one occasion does not mean that you need treatment. If one blood pressure reading is high, ask your health care provider about having it checked again. TREATMENT  Treating high blood pressure includes making lifestyle changes and possibly taking medication. Living a healthy lifestyle can help lower high blood pressure. You may need to change some of your habits. Lifestyle changes may include:  Following the DASH diet. This diet is high in fruits, vegetables, and whole grains. It is low in salt, red meat, and added  sugars.  Getting at least 2 1/2 hours of brisk physical activity every week.  Losing weight if necessary.  Not smoking.  Limiting alcoholic beverages.  Learning ways to reduce stress. If lifestyle changes are not enough to get your blood pressure under control, your health care provider may prescribe medicine. You may need to take more than one. Work closely with your health care provider to understand the risks and benefits. HOME CARE INSTRUCTIONS  Have your blood pressure rechecked as directed by your health care provider.   Only take medicine as directed by your health care provider. Follow the directions carefully. Blood pressure medicines must be taken as prescribed. The medicine does not work as well when you skip doses. Skipping doses also puts you at risk for problems.   Do not smoke.   Monitor your blood pressure at home as directed by your health care provider. SEEK MEDICAL CARE IF:   You think you are having a reaction to medicines taken.  You have recurrent headaches or feel dizzy.  You have swelling in your ankles.  You have trouble with your vision. SEEK IMMEDIATE MEDICAL CARE IF:  You develop a severe headache or confusion.  You have unusual weakness, numbness, or feel faint.  You have severe chest or abdominal pain.  You vomit repeatedly.  You have trouble breathing. MAKE SURE YOU:   Understand these instructions.  Will watch your condition.  Will get help right away if you are not doing well or get worse. Document Released: 03/30/2005 Document Revised: 04/04/2013 Document Reviewed: 01/20/2013 [facility] Patient Information 2015 [place], [place]. This information is not intended to replace advice given to you by your health care provider. Make sure you discuss any questions you have with your health care provider.

## 2013-10-24 NOTE — Assessment & Plan Note (Addendum)
BP Readings from Last 3 Encounters:  10/24/13 216/115  10/09/13 167/97  08/22/13 166/88    Lab Results  Component Value Date   NA 136 05/22/2013   K 4.0 05/22/2013   CREATININE 0.58 05/22/2013    Assessment: Blood pressure control: severely elevated Progress toward BP goal:  deteriorated Comments: elevated likely 2/2 pain and pt being upset as well as being off BP medications since 6/29  Plan: Medications:  continue current medications (lisinopril 5 mg qd, Lopressor 25 mg bid)-Rx refills today  Educational resources provided: brochure Self management tools provided: other (see comments) Other plans: will f/u in 1week
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
## 2013-10-24 NOTE — Assessment & Plan Note (Signed)
Did dipstick today with trace hbg Pt aware she needs to f/u with urology

## 2013-10-24 NOTE — Assessment & Plan Note (Signed)
Rx refill of Lisinopril, Zocor, Lopressor, prn NTG, and Aspirin today

## 2013-10-25 NOTE — Progress Notes (Signed)
Case discussed with Dr. McLean at the time of the visit.  We reviewed the resident's history and exam and pertinent patient test results.  I agree with the assessment, diagnosis, and plan of care documented in the resident's note.     

## 2013-11-02 ENCOUNTER — Ambulatory Visit (HOSPITAL_COMMUNITY)
Admission: RE | Admit: 2013-11-02 | Discharge: 2013-11-02 | Disposition: A | Discharge status: Home / Self Care | Payer: Self-pay | Source: Ambulatory Visit | Attending: Internal Medicine | Admitting: Internal Medicine

## 2013-11-02 ENCOUNTER — Ambulatory Visit (HOSPITAL_COMMUNITY): Payer: Self-pay

## 2013-11-02 DIAGNOSIS — M47812 Spondylosis without myelopathy or radiculopathy, cervical region: Secondary | ICD-10-CM | POA: Insufficient documentation

## 2013-11-02 DIAGNOSIS — M79609 Pain in unspecified limb: Secondary | ICD-10-CM | POA: Insufficient documentation

## 2013-11-02 DIAGNOSIS — M48061 Spinal stenosis, lumbar region without neurogenic claudication: Secondary | ICD-10-CM | POA: Insufficient documentation

## 2013-11-02 DIAGNOSIS — M539 Dorsopathy, unspecified: Secondary | ICD-10-CM | POA: Insufficient documentation

## 2013-11-02 DIAGNOSIS — M25519 Pain in unspecified shoulder: Secondary | ICD-10-CM | POA: Insufficient documentation

## 2013-11-02 DIAGNOSIS — R29898 Other symptoms and signs involving the musculoskeletal system: Secondary | ICD-10-CM | POA: Insufficient documentation

## 2013-11-02 DIAGNOSIS — G8929 Other chronic pain: Secondary | ICD-10-CM

## 2013-11-02 DIAGNOSIS — M549 Dorsalgia, unspecified: Secondary | ICD-10-CM | POA: Insufficient documentation

## 2013-11-02 DIAGNOSIS — M4802 Spinal stenosis, cervical region: Secondary | ICD-10-CM | POA: Insufficient documentation

## 2013-11-02 DIAGNOSIS — M4040 Postural lordosis, site unspecified: Secondary | ICD-10-CM | POA: Insufficient documentation

## 2013-11-02 DIAGNOSIS — R609 Edema, unspecified: Secondary | ICD-10-CM | POA: Insufficient documentation

## 2013-11-02 DIAGNOSIS — R209 Unspecified disturbances of skin sensation: Secondary | ICD-10-CM | POA: Insufficient documentation

## 2013-11-03 ENCOUNTER — Telehealth: Payer: Self-pay | Admitting: *Deleted

## 2013-11-03 ENCOUNTER — Encounter: Payer: Self-pay | Admitting: Internal Medicine

## 2013-11-03 ENCOUNTER — Other Ambulatory Visit: Payer: Self-pay | Admitting: Internal Medicine

## 2013-11-03 DIAGNOSIS — G8929 Other chronic pain: Secondary | ICD-10-CM

## 2013-11-03 DIAGNOSIS — M542 Cervicalgia: Secondary | ICD-10-CM

## 2013-11-03 DIAGNOSIS — M549 Dorsalgia, unspecified: Secondary | ICD-10-CM

## 2013-11-03 NOTE — Assessment & Plan Note (Signed)
Addendum reviewed cervical MRI will refer to Neurosurgery may be a problem b/c pt does not have insurance but will try.

## 2013-11-03 NOTE — Telephone Encounter (Signed)
FYI

## 2013-11-03 NOTE — Telephone Encounter (Signed)
Brenda [name] calls to say report is ready from recent mri and is in [place] but she will also fax report to [phone]

## 2013-11-13 ENCOUNTER — Encounter: Payer: Self-pay | Admitting: Internal Medicine

## 2013-11-14 ENCOUNTER — Other Ambulatory Visit: Payer: Self-pay

## 2013-11-14 ENCOUNTER — Ambulatory Visit (HOSPITAL_COMMUNITY)
Admission: RE | Admit: 2013-11-14 | Discharge: 2013-11-14 | Disposition: A | Discharge status: Home / Self Care | Payer: Self-pay | Source: Ambulatory Visit | Attending: Internal Medicine | Admitting: Internal Medicine

## 2013-11-14 ENCOUNTER — Ambulatory Visit (INDEPENDENT_AMBULATORY_CARE_PROVIDER_SITE_OTHER): Payer: No Typology Code available for payment source | Admitting: Internal Medicine

## 2013-11-14 ENCOUNTER — Observation Stay (HOSPITAL_COMMUNITY)
Admission: AD | Admit: 2013-11-14 | Discharge: 2013-11-16 | Disposition: A | Discharge status: Home / Self Care | Payer: No Typology Code available for payment source | Source: Ambulatory Visit | Attending: Internal Medicine | Admitting: Internal Medicine

## 2013-11-14 ENCOUNTER — Observation Stay (HOSPITAL_COMMUNITY): Payer: Self-pay

## 2013-11-14 VITALS — BP 181/91 | HR 64

## 2013-11-14 DIAGNOSIS — Z87448 Personal history of other diseases of urinary system: Secondary | ICD-10-CM | POA: Insufficient documentation

## 2013-11-14 DIAGNOSIS — F419 Anxiety disorder, unspecified: Secondary | ICD-10-CM | POA: Diagnosis present

## 2013-11-14 DIAGNOSIS — F17200 Nicotine dependence, unspecified, uncomplicated: Secondary | ICD-10-CM | POA: Insufficient documentation

## 2013-11-14 DIAGNOSIS — F411 Generalized anxiety disorder: Secondary | ICD-10-CM | POA: Insufficient documentation

## 2013-11-14 DIAGNOSIS — I10 Essential (primary) hypertension: Secondary | ICD-10-CM | POA: Insufficient documentation

## 2013-11-14 DIAGNOSIS — I209 Angina pectoris, unspecified: Secondary | ICD-10-CM

## 2013-11-14 DIAGNOSIS — N281 Cyst of kidney, acquired: Secondary | ICD-10-CM | POA: Insufficient documentation

## 2013-11-14 DIAGNOSIS — IMO0002 Reserved for concepts with insufficient information to code with codable children: Secondary | ICD-10-CM | POA: Insufficient documentation

## 2013-11-14 DIAGNOSIS — F3110 Bipolar disorder, current episode manic without psychotic features, unspecified: Secondary | ICD-10-CM | POA: Insufficient documentation

## 2013-11-14 DIAGNOSIS — R0789 Other chest pain: Principal | ICD-10-CM | POA: Insufficient documentation

## 2013-11-14 DIAGNOSIS — K589 Irritable bowel syndrome without diarrhea: Secondary | ICD-10-CM | POA: Insufficient documentation

## 2013-11-14 DIAGNOSIS — Z720 Tobacco use: Secondary | ICD-10-CM

## 2013-11-14 DIAGNOSIS — M549 Dorsalgia, unspecified: Secondary | ICD-10-CM

## 2013-11-14 DIAGNOSIS — I2510 Atherosclerotic heart disease of native coronary artery without angina pectoris: Secondary | ICD-10-CM

## 2013-11-14 DIAGNOSIS — Z5900 Homelessness unspecified: Secondary | ICD-10-CM | POA: Insufficient documentation

## 2013-11-14 DIAGNOSIS — Z8673 Personal history of transient ischemic attack (TIA), and cerebral infarction without residual deficits: Secondary | ICD-10-CM | POA: Insufficient documentation

## 2013-11-14 DIAGNOSIS — R5381 Other malaise: Secondary | ICD-10-CM | POA: Insufficient documentation

## 2013-11-14 DIAGNOSIS — R5383 Other fatigue: Secondary | ICD-10-CM

## 2013-11-14 DIAGNOSIS — G8929 Other chronic pain: Secondary | ICD-10-CM | POA: Insufficient documentation

## 2013-11-14 DIAGNOSIS — T7491XS Unspecified adult maltreatment, confirmed, sequela: Secondary | ICD-10-CM

## 2013-11-14 DIAGNOSIS — R61 Generalized hyperhidrosis: Secondary | ICD-10-CM | POA: Insufficient documentation

## 2013-11-14 DIAGNOSIS — F319 Bipolar disorder, unspecified: Secondary | ICD-10-CM

## 2013-11-14 DIAGNOSIS — R112 Nausea with vomiting, unspecified: Secondary | ICD-10-CM | POA: Insufficient documentation

## 2013-11-14 DIAGNOSIS — R109 Unspecified abdominal pain: Secondary | ICD-10-CM

## 2013-11-14 DIAGNOSIS — R079 Chest pain, unspecified: Secondary | ICD-10-CM

## 2013-11-14 DIAGNOSIS — E785 Hyperlipidemia, unspecified: Secondary | ICD-10-CM | POA: Insufficient documentation

## 2013-11-14 DIAGNOSIS — N644 Mastodynia: Secondary | ICD-10-CM | POA: Diagnosis present

## 2013-11-14 DIAGNOSIS — I25119 Atherosclerotic heart disease of native coronary artery with unspecified angina pectoris: Secondary | ICD-10-CM

## 2013-11-14 DIAGNOSIS — Z8742 Personal history of other diseases of the female genital tract: Secondary | ICD-10-CM | POA: Insufficient documentation

## 2013-11-14 HISTORY — DX: Post-traumatic stress disorder, unspecified: F43.10

## 2013-11-14 HISTORY — DX: Anemia, unspecified: D64.9

## 2013-11-14 HISTORY — DX: Heart failure, unspecified: I50.9

## 2013-11-14 HISTORY — DX: Bipolar disorder, unspecified: F31.9

## 2013-11-14 LAB — BASIC METABOLIC PANEL WITH GFR
BUN: 9 mg/dL (ref 6–23)
CALCIUM: 9.4 mg/dL (ref 8.4–10.5)
CO2: 18 meq/L — AB (ref 19–32)
CREATININE: 0.58 mg/dL (ref 0.50–1.10)
Chloride: 105 mEq/L (ref 96–112)
GFR, Est African American: >89 mL/min
GFR, Est Non African American: >89 mL/min
Glucose, Bld: 128 mg/dL — ABNORMAL HIGH (ref 70–99)
Potassium: 3.7 mEq/L (ref 3.5–5.3)
Sodium: 140 mEq/L (ref 135–145)

## 2013-11-14 LAB — CBC WITH DIFFERENTIAL/PLATELET
BASOS ABS: 0 10*3/uL (ref 0.0–0.1)
Basophils Relative: 0 % (ref 0–1)
EOS PCT: 1 % (ref 0–5)
Eosinophils Absolute: 0.1 10*3/uL (ref 0.0–0.7)
HCT: 41.2 % (ref 36.0–46.0)
Hemoglobin: 14.2 g/dL (ref 12.0–15.0)
LYMPHS PCT: 23 % (ref 12–46)
Lymphs Abs: 2.5 10*3/uL (ref 0.7–4.0)
MCH: 32.9 pg (ref 26.0–34.0)
MCHC: 34.5 g/dL (ref 30.0–36.0)
MCV: 95.6 fL (ref 78.0–100.0)
Monocytes Absolute: 0.6 10*3/uL (ref 0.1–1.0)
Monocytes Relative: 6 % (ref 3–12)
Neutro Abs: 7.6 10*3/uL (ref 1.7–7.7)
Neutrophils Relative %: 70 % (ref 43–77)
PLATELETS: 283 10*3/uL (ref 150–400)
RBC: 4.31 MIL/uL (ref 3.87–5.11)
RDW: 13.4 % (ref 11.5–15.5)
WBC: 10.8 10*3/uL — ABNORMAL HIGH (ref 4.0–10.5)

## 2013-11-14 LAB — URINALYSIS, ROUTINE W REFLEX MICROSCOPIC
BILIRUBIN URINE: NEGATIVE
Glucose, UA: NEGATIVE mg/dL
KETONES UR: NEGATIVE mg/dL
Leukocytes, UA: NEGATIVE
NITRITE: NEGATIVE
Protein, ur: NEGATIVE mg/dL
Specific Gravity, Urine: 1.023 (ref 1.005–1.030)
Urobilinogen, UA: 1 mg/dL (ref 0.0–1.0)
pH: 6 (ref 5.0–8.0)

## 2013-11-14 LAB — RAPID URINE DRUG SCREEN, HOSP PERFORMED
Amphetamines: NOT DETECTED
BARBITURATES: NOT DETECTED
Benzodiazepines: POSITIVE — AB
Cocaine: NOT DETECTED
Opiates: POSITIVE — AB
Tetrahydrocannabinol: POSITIVE — AB

## 2013-11-14 LAB — URINE MICROSCOPIC-ADD ON

## 2013-11-14 LAB — TROPONIN I: Troponin I: <0.3 ng/mL (ref <0.30)

## 2013-11-14 MED ORDER — OXYCODONE-ACETAMINOPHEN 10-325 MG PO TABS
1.0000 | ORAL_TABLET | Freq: Once | ORAL | Status: AC
  Administered 2013-11-14: 1 via ORAL

## 2013-11-14 MED ORDER — SERTRALINE HCL 50 MG PO TABS
75.0000 mg | ORAL_TABLET | Freq: Every day | ORAL | Status: DC
  Administered 2013-11-14 – 2013-11-16 (×3): 75 mg via ORAL
  Filled 2013-11-14 (×3): qty 1

## 2013-11-14 MED ORDER — SIMVASTATIN 20 MG PO TABS
20.0000 mg | ORAL_TABLET | Freq: Every evening | ORAL | Status: DC
  Administered 2013-11-14 – 2013-11-15 (×2): 20 mg via ORAL
  Filled 2013-11-14 (×3): qty 1

## 2013-11-14 MED ORDER — ACETAMINOPHEN 325 MG PO TABS: 650.0000 mg | ORAL_TABLET | ORAL | Status: DC | PRN

## 2013-11-14 MED ORDER — LISINOPRIL 5 MG PO TABS
5.0000 mg | ORAL_TABLET | Freq: Every day | ORAL | Status: DC
  Administered 2013-11-14 – 2013-11-16 (×3): 5 mg via ORAL
  Filled 2013-11-14 (×3): qty 1

## 2013-11-14 MED ORDER — PANTOPRAZOLE SODIUM 40 MG PO TBEC
40.0000 mg | DELAYED_RELEASE_TABLET | Freq: Every day | ORAL | Status: DC
  Administered 2013-11-14 – 2013-11-16 (×3): 40 mg via ORAL
  Filled 2013-11-14: qty 1

## 2013-11-14 MED ORDER — MIRTAZAPINE 30 MG PO TABS
30.0000 mg | ORAL_TABLET | Freq: Every day | ORAL | Status: DC
  Administered 2013-11-14 – 2013-11-15 (×2): 30 mg via ORAL
  Filled 2013-11-14 (×4): qty 1

## 2013-11-14 MED ORDER — KETOROLAC TROMETHAMINE 15 MG/ML IJ SOLN
15.0000 mg | Freq: Once | INTRAMUSCULAR | Status: AC
  Administered 2013-11-14: 15 mg via INTRAVENOUS
  Filled 2013-11-14 (×2): qty 1

## 2013-11-14 MED ORDER — ASPIRIN 81 MG PO CHEW
243.0000 mg | CHEWABLE_TABLET | Freq: Once | ORAL | Status: AC
  Administered 2013-11-14: 243 mg via ORAL

## 2013-11-14 MED ORDER — OXYCODONE-ACETAMINOPHEN 5-325 MG PO TABS
1.0000 | ORAL_TABLET | Freq: Once | ORAL | Status: DC
  Filled 2013-11-14: qty 1

## 2013-11-14 MED ORDER — METOPROLOL TARTRATE 25 MG PO TABS
25.0000 mg | ORAL_TABLET | Freq: Two times a day (BID) | ORAL | Status: DC
  Administered 2013-11-14 – 2013-11-16 (×4): 25 mg via ORAL
  Filled 2013-11-14 (×5): qty 1

## 2013-11-14 MED ORDER — ENOXAPARIN SODIUM 40 MG/0.4ML ~~LOC~~ SOLN
40.0000 mg | SUBCUTANEOUS | Status: DC
  Administered 2013-11-14 – 2013-11-16 (×3): 40 mg via SUBCUTANEOUS
  Filled 2013-11-14 (×3): qty 0.4

## 2013-11-14 MED ORDER — OXYCODONE HCL 5 MG PO TABS
5.0000 mg | ORAL_TABLET | Freq: Once | ORAL | Status: DC
  Filled 2013-11-14: qty 1

## 2013-11-14 MED ORDER — AMLODIPINE BESYLATE 5 MG PO TABS
5.0000 mg | ORAL_TABLET | Freq: Every day | ORAL | Status: DC
  Administered 2013-11-14 – 2013-11-16 (×3): 5 mg via ORAL
  Filled 2013-11-14 (×3): qty 1

## 2013-11-14 MED ORDER — OXYCODONE-ACETAMINOPHEN 10-325 MG PO TABS: 1.0000 | ORAL_TABLET | Freq: Once | ORAL | Status: DC

## 2013-11-14 MED ORDER — NITROGLYCERIN 0.3 MG SL SUBL
0.3000 mg | SUBLINGUAL_TABLET | Freq: Once | SUBLINGUAL | Status: AC
  Administered 2013-11-14: 0.3 mg via SUBLINGUAL

## 2013-11-14 MED ORDER — DIVALPROEX SODIUM 500 MG PO DR TAB
500.0000 mg | DELAYED_RELEASE_TABLET | Freq: Two times a day (BID) | ORAL | Status: DC
  Administered 2013-11-14 – 2013-11-16 (×4): 500 mg via ORAL
  Filled 2013-11-14 (×7): qty 1

## 2013-11-14 MED ORDER — MORPHINE SULFATE 2 MG/ML IJ SOLN
2.0000 mg | Freq: Once | INTRAMUSCULAR | Status: AC
  Administered 2013-11-14: 2 mg via INTRAVENOUS
  Filled 2013-11-14: qty 1

## 2013-11-14 MED ORDER — ASPIRIN EC 81 MG PO TBEC
81.0000 mg | DELAYED_RELEASE_TABLET | Freq: Every day | ORAL | Status: DC
  Administered 2013-11-14 – 2013-11-16 (×3): 81 mg via ORAL
  Filled 2013-11-14 (×3): qty 1

## 2013-11-14 MED ORDER — ACETAMINOPHEN 325 MG PO TABS: 325.0000 mg | ORAL_TABLET | Freq: Once | ORAL | Status: DC

## 2013-11-14 MED ORDER — OXYCODONE HCL 5 MG PO TABS: 5.0000 mg | ORAL_TABLET | Freq: Once | ORAL | Status: DC

## 2013-11-14 MED ORDER — ONDANSETRON HCL 4 MG/2ML IJ SOLN: 4.0000 mg | Freq: Four times a day (QID) | INTRAMUSCULAR | Status: DC | PRN

## 2013-11-14 MED ORDER — TRAMADOL HCL 50 MG PO TABS
50.0000 mg | ORAL_TABLET | Freq: Four times a day (QID) | ORAL | Status: DC | PRN
  Administered 2013-11-14 – 2013-11-15 (×2): 50 mg via ORAL
  Filled 2013-11-14 (×2): qty 1
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
## 2013-11-14 NOTE — Assessment & Plan Note (Addendum)
Pt p/w left sided chest pain radiating into her left arm that began last night around 3AM while she was lying in bed.  She describes the pain as "something squeezing" her chest.  She had an NSTEMI in the past secondary to cocaine abuse and describes the pain as the same.  She reports the pain is continuous associated with diaphoresis, N/V, SOB, and dizziness.  She took 2 NTG and an ASA which normally helps her pain but did not this time.  Of note, she has been doing some heavy lifting recently because she is moving.  Denies the chest pain is associated with food or changes in position.  Denies any recent use of recreational drugs including no crack/cocaine, THC, amphetamines.  Also has multiple complaints of back pain and diarrhea (due to chronic E. coli?).  When she got to the clinic she was limping in pain and continuing to c/o chest pain.  We did an EKG which was unchanged from previous.  Troponin was neg.  UDS pending. She was given 3 NTG without relief.  Reports continuous chest pain that is 6-7/10.  We have decided to admit the patient for observation for ACS rule out.  Of note, pt also has extensive psychiatric history including bipolar disorder followed by [facility].  -admit to SDU for observation for ACS rule out  -percocet
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
## 2013-11-15 ENCOUNTER — Observation Stay (HOSPITAL_COMMUNITY): Payer: No Typology Code available for payment source

## 2013-11-15 ENCOUNTER — Encounter (HOSPITAL_COMMUNITY): Payer: Self-pay | Admitting: General Practice

## 2013-11-15 ENCOUNTER — Observation Stay (HOSPITAL_COMMUNITY): Payer: Self-pay

## 2013-11-15 DIAGNOSIS — R079 Chest pain, unspecified: Secondary | ICD-10-CM

## 2013-11-15 LAB — URINALYSIS, ROUTINE W REFLEX MICROSCOPIC
Glucose, UA: NEGATIVE mg/dL
Ketones, ur: 15 mg/dL — AB
Nitrite: NEGATIVE
PROTEIN: NEGATIVE mg/dL
Specific Gravity, Urine: 1.029 (ref 1.005–1.030)
UROBILINOGEN UA: 1 mg/dL (ref 0.0–1.0)
pH: 6 (ref 5.0–8.0)

## 2013-11-15 LAB — URINE MICROSCOPIC-ADD ON

## 2013-11-15 MED ORDER — KETOROLAC TROMETHAMINE 30 MG/ML IJ SOLN
30.0000 mg | Freq: Once | INTRAMUSCULAR | Status: AC
  Administered 2013-11-15: 30 mg via INTRAVENOUS
  Filled 2013-11-15: qty 1

## 2013-11-15 MED ORDER — REGADENOSON 0.4 MG/5ML IV SOLN
INTRAVENOUS | Status: AC
  Administered 2013-11-15: 0.4 mg via INTRAVENOUS
  Filled 2013-11-15: qty 5

## 2013-11-15 MED ORDER — TECHNETIUM TC 99M SESTAMIBI GENERIC - CARDIOLITE
10.0000 | Freq: Once | INTRAVENOUS | Status: AC | PRN
  Administered 2013-11-15: 10 via INTRAVENOUS

## 2013-11-15 MED ORDER — REGADENOSON 0.4 MG/5ML IV SOLN
0.4000 mg | Freq: Once | INTRAVENOUS | Status: AC
  Administered 2013-11-15: 0.4 mg via INTRAVENOUS
  Filled 2013-11-15: qty 5

## 2013-11-15 MED ORDER — TECHNETIUM TC 99M SESTAMIBI GENERIC - CARDIOLITE
30.0000 | Freq: Once | INTRAVENOUS | Status: AC | PRN
  Administered 2013-11-15: 30 via INTRAVENOUS

## 2013-11-15 MED ORDER — KETOROLAC TROMETHAMINE 30 MG/ML IJ SOLN
30.0000 mg | Freq: Four times a day (QID) | INTRAMUSCULAR | Status: DC | PRN
  Administered 2013-11-15 – 2013-11-16 (×4): 30 mg via INTRAVENOUS
  Filled 2013-11-15 (×4): qty 1

## 2013-11-15 MED ORDER — TRAMADOL HCL 50 MG PO TABS
100.0000 mg | ORAL_TABLET | Freq: Four times a day (QID) | ORAL | Status: DC | PRN
  Administered 2013-11-16: 100 mg via ORAL
  Filled 2013-11-15: qty 2

## 2013-11-15 NOTE — Progress Notes (Signed)
Subjective: Her back is hurting(chronic).  Improved after morphine.  + left lateral pectoral/shoulder tenderness.  No chest"squeezing" that she had yesterday.   Objective: Vital signs in last 24 hours: Temp:  [98 F (36.7 C)-98.7 F (37.1 C)] 98 F (36.7 C) (08/05 0516) Pulse Rate:  [60-100] 60 (08/05 0516) Resp:  [17-18] 18 (08/05 0516) BP: (126-216)/(61-115) 126/61 mmHg (08/05 0516) SpO2:  [99 %] 99 % (08/05 0516) Weight:  [127 lb 12.8 oz (57.97 kg)-135 lb (61.236 kg)] 127 lb 12.8 oz (57.97 kg) (08/05 0516) Last BM Date: 11/14/13  Intake/Output from previous day: 08/04 0701 - 08/05 0700 In: 480 [P.O.:480] Out: 150 [Urine:150] Intake/Output this shift: Total I/O In: -  Out: 300 [Urine:300]  Medications Current Facility-Administered Medications  Medication Dose Route Frequency Provider Last Rate Last Dose  . acetaminophen (TYLENOL) tablet 650 mg  650 mg Oral Q4H PRN Blain Pais, MD      . amLODipine (NORVASC) tablet 5 mg  5 mg Oral Daily Troy Sine, MD   5 mg at 11/14/13 2023  . aspirin EC tablet 81 mg  81 mg Oral Daily Blain Pais, MD   81 mg at 11/14/13 1634  . divalproex (DEPAKOTE) DR tablet 500 mg  500 mg Oral BID Blain Pais, MD   500 mg at 11/14/13 1635  . enoxaparin (LOVENOX) injection 40 mg  40 mg Subcutaneous Q24H Blain Pais, MD   40 mg at 11/14/13 1635  . lisinopril (PRINIVIL,ZESTRIL) tablet 5 mg  5 mg Oral Daily Blain Pais, MD   5 mg at 11/14/13 1635  . metoprolol tartrate (LOPRESSOR) tablet 25 mg  25 mg Oral BID Troy Sine, MD   25 mg at 11/14/13 2152  . mirtazapine (REMERON) tablet 30 mg  30 mg Oral QHS Blain Pais, MD   30 mg at 11/14/13 2152  . ondansetron (ZOFRAN) injection 4 mg  4 mg Intravenous Q6H PRN Blain Pais, MD      . pantoprazole (PROTONIX) EC tablet 40 mg  40 mg Oral Daily Blain Pais, MD   40 mg at 11/14/13 1634  . sertraline (ZOLOFT) tablet 75 mg  75 mg Oral Daily  Blain Pais, MD   75 mg at 11/14/13 1635  . simvastatin (ZOCOR) tablet 20 mg  20 mg Oral QPM Blain Pais, MD   20 mg at 11/14/13 1712  . traMADol (ULTRAM) tablet 50 mg  50 mg Oral Q6H PRN Blain Pais, MD   50 mg at 11/15/13 0747   Facility-Administered Medications Ordered in Other Encounters  Medication Dose Route Frequency Provider Last Rate Last Dose  . acetaminophen (TYLENOL) tablet 325 mg  325 mg Oral Once Jones Bales, MD        PE: General appearance: alert, cooperative and no distress Lungs: clear to auscultation bilaterally Heart: regular rate and rhythm and 1/6 sys MM Extremities: No LEE Pulses: 2+ and symmetric Skin: Warm and dry Neurologic: Grossly normal  Lab Results:   Recent Labs  11/14/13 0908  WBC 10.8*  HGB 14.2  HCT 41.2  PLT 283   BMET  Recent Labs  11/14/13 0908  NA 140  K 3.7  CL 105  CO2 18*  GLUCOSE 128*  BUN 9  CREATININE 0.58  CALCIUM 9.4    Assessment/Plan  Principal Problem:   Chest pain She ruled out for MI.  Nuclear stress test today.  Sinus brady(59) on EKG this morning.  Active Problems:   HYPERTENSION, ESSENTIAL NOS  BP improved on amlodipine 5mg , lisinopril 5mg , lopressor 25 bid.    Chronic back pain   Anxiety   Tobacco abuse/marijuana  We discussed cessation   Non-occlusive coronary artery disease with chronic chest pain   Bipolar I disorder, most recent episode (or current) manic, unspecified    LOS: 1 day    HAGER, BRYAN PA-C 11/15/2013 8:18 AM  Agree with above assessment. EKG shows no acute ischemic changes.  If myoview and echo today are okay, no further ischemic workup.

## 2013-11-15 NOTE — Progress Notes (Signed)
  Date: 11/15/2013  Patient name: Brenda [name]  Medical record number: [id number]  Date of birth: Feb 21, 1961   This patient has been seen and the plan of care was discussed with the house staff. Please see their note for complete details. I concur with their findings with the following additions/corrections:  Will make best attempts to manage her chronic back pain but she has marked history of opiate and benzo misuse. She is anxious to have surgical intervention as to improve her pain. I have reiterated that may not necessarily be the answer to solving her pain syndrome. We will consult our neurosurgery staff to have their input. In regards to management of pain, we will try to maximize non-opioid based therapy and follow goals established by her pcp.  Carlyle Basques, MD 11/15/2013, 10:10 PM

## 2013-11-15 NOTE — Progress Notes (Signed)
Nuclear stress test normal.  Ok to DC from Cards standpoint.  Tarri Fuller, PA-C

## 2013-11-15 NOTE — Progress Notes (Signed)
    Underwent nuclear stress test. Tolerated procedure well.  Perry Mount PA-C  [facility]

## 2013-11-15 NOTE — H&P (Signed)
See attending addendum on Dr. Serita Grit note

## 2013-11-15 NOTE — Discharge Instructions (Addendum)
You were hospitalized for chest pain which is the result of your chronic pain.   We want to give you a second chance in a controlled environment, and we have found a clinic that would be willing to accept you. Please follow-up with them given the information tomorrow:  Methadone Pain clinic: You have to go there before 5 AM for intake on Monday through Friday.  [facility] [address] [place], [place] [address]  Phone: [phone]  Please bring your driver's license with you when you go.
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
## 2013-11-15 NOTE — Progress Notes (Signed)
UR completed 

## 2013-11-15 NOTE — H&P (Signed)
  Date: 11/15/2013  Patient name: Brenda [name]  Medical record number: [id number]  Date of birth: Jan 13, 1961   I have seen and evaluated Brenda [name] and discussed their care with the Residency Team.   Assessment and Plan: I have seen and evaluated the patient as outlined above. I agree with the formulated Assessment and Plan as detailed in the residents' admission note.  Carlyle Basques, MD 8/5/201510:16 PM

## 2013-11-16 DIAGNOSIS — F329 Major depressive disorder, single episode, unspecified: Secondary | ICD-10-CM

## 2013-11-16 DIAGNOSIS — R109 Unspecified abdominal pain: Secondary | ICD-10-CM

## 2013-11-16 DIAGNOSIS — M549 Dorsalgia, unspecified: Secondary | ICD-10-CM

## 2013-11-16 DIAGNOSIS — R300 Dysuria: Secondary | ICD-10-CM

## 2013-11-16 DIAGNOSIS — I359 Nonrheumatic aortic valve disorder, unspecified: Secondary | ICD-10-CM

## 2013-11-16 DIAGNOSIS — F3289 Other specified depressive episodes: Secondary | ICD-10-CM

## 2013-11-16 MED ORDER — AMLODIPINE BESYLATE 5 MG PO TABS: 5.0000 mg | ORAL_TABLET | Freq: Every day | ORAL | Status: DC

## 2013-11-16 MED ORDER — PANTOPRAZOLE SODIUM 40 MG PO TBEC: 40.0000 mg | DELAYED_RELEASE_TABLET | Freq: Every day | ORAL | Status: DC

## 2013-11-16 MED ORDER — METHOCARBAMOL 500 MG PO TABS: 500.0000 mg | ORAL_TABLET | Freq: Four times a day (QID) | ORAL | Status: DC | PRN

## 2013-11-16 NOTE — Progress Notes (Signed)
Nutrition Brief Note  Patient identified on the Malnutrition Screening Tool (MST) Report. Weight is stable since 1 year ago. Some fluctuations in weight over the past year. No significant weight loss noted.  Wt Readings from Last 15 Encounters:  11/16/13 127 lb 4.8 oz (57.743 kg)  10/24/13 135 lb 4.8 oz (61.372 kg)  10/09/13 134 lb (60.782 kg)  08/22/13 133 lb 12.8 oz (60.691 kg)  05/22/13 134 lb (60.782 kg)  03/30/13 130 lb 9.6 oz (59.24 kg)  03/13/13 135 lb 8 oz (61.462 kg)  02/26/13 135 lb 12.8 oz (61.598 kg)  02/25/13 133 lb (60.328 kg)  02/06/13 133 lb 11.2 oz (60.646 kg)  01/05/13 127 lb (57.607 kg)  12/09/12 125 lb 14.4 oz (57.108 kg)  11/25/12 130 lb 1.6 oz (59.013 kg)  10/25/12 127 lb (57.607 kg)  10/20/12 124 lb (56.246 kg)    Body mass index is 22.56 kg/(m^2). Patient meets criteria for normal weight based on current BMI.   Current diet order is Heart Healthy, patient is consuming approximately 95-100% of meals at this time. Labs and medications reviewed.   No nutrition interventions warranted at this time. If nutrition issues arise, please consult RD.   Molli Barrows, RD, LDN, [place] Pager [phone] After Hours Pager [phone]
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
## 2013-11-16 NOTE — Progress Notes (Signed)
Subjective: Yesterday, neurosurgery consult was requested to speak with the patient about options for surgery of her case. They reported her back pain was possibly a myofascial pain syndrome, and that she was not a suitable candidate for surgery. They recommend outpatient cervical epidural at C6-7 and outpatient pain management referral.  This morning, she appears in better spirits, but still reports pain in her back. We have found a Pain Clinic that will take her and that provides methadone treatments.  Objective: Vital signs in last 24 hours: Filed Vitals:   11/15/13 1247 11/15/13 1418 11/15/13 2025 11/16/13 0513  BP: 150/73 158/72 150/65 134/67  Pulse:  58 60 62  Temp:  98.6 F (37 C) 98.6 F (37 C) 97.9 F (36.6 C)  TempSrc:  Oral Oral Oral  Resp:  16 16 16   Height:      Weight:    57.743 kg (127 lb 4.8 oz)  SpO2:  100% 99% 100%   Weight change: -3.493 kg (-7 lb 11.2 oz)  Intake/Output Summary (Last 24 hours) at 11/16/13 0707 Last data filed at 11/15/13 0759  Gross per 24 hour  Intake      0 ml  Output    300 ml  Net   -300 ml   Physical exam: Gen: Awake, lying in bed, in no acute distress CV: RRR, nl V6/H6, 3/6 systolic decrescendo murmur, no rubs or gallops Pulm: Normal work of breathing, no wheezes or crackles Abd: Soft, non-tender, non-distended, normoactive bowel sounds Neuro: Alert and oriented x3, CN II-XII intact, 5/5 strength in all extremities, normal sensation in all extremities  Lab Results: Basic Metabolic Panel:  Recent Labs  11/14/13 0908  NA 140  K 3.7  CL 105  CO2 18*  GLUCOSE 128*  BUN 9  CREATININE 0.58  CALCIUM 9.4   CBC:  Recent Labs  11/14/13 0908  WBC 10.8*  NEUTROABS 7.6  HGB 14.2  HCT 41.2  MCV 95.6  PLT 283   Cardiac Enzymes:  Recent Labs  11/14/13 0908 11/14/13 1620 11/14/13 2034  TROPONINI <0.30 <0.30 <0.30   Urine Drug Screen: Drugs of Abuse     Component Value Date/Time   LABOPIA POSITIVE* 11/14/2013 1803     COCAINSCRNUR NONE DETECTED 11/14/2013 1803   LABBENZ POSITIVE* 11/14/2013 1803   AMPHETMU NONE DETECTED 11/14/2013 1803   THCU POSITIVE* 11/14/2013 1803   LABBARB NONE DETECTED 11/14/2013 1803    Urinalysis:  Recent Labs  11/14/13 1803 11/15/13 1029  COLORURINE AMBER* AMBER*  LABSPEC 1.023 1.029  PHURINE 6.0 6.0  GLUCOSEU NEGATIVE NEGATIVE  HGBUR LARGE* SMALL*  BILIRUBINUR NEGATIVE SMALL*  KETONESUR NEGATIVE 15*  PROTEINUR NEGATIVE NEGATIVE  UROBILINOGEN 1.0 1.0  NITRITE NEGATIVE NEGATIVE  LEUKOCYTESUR NEGATIVE SMALL*   Studies/Results: Nuclear Stress Test: 11/16/13 FINDINGS: Baseline electrocardiogram shows sinus rhythm with tall T waves. No significant changes are noted following the Lexiscan infusion. Rest and stress perfusion images show a normal pattern. Normal regional wall motion overall left ventricular systolic function with an ejection fraction of 63%.    IMPRESSION: Normal myocardial perfusion study  DG Chest 2 View: 11/14/13 FINDINGS: The heart size and mediastinal contours are within normal limits. There is no evidence of pulmonary edema, consolidation, pneumothorax, nodule or pleural fluid. The visualized skeletal structures are unremarkable.   IMPRESSION:  No active disease.   EKG: 11/14/13  FINDINGS: normal EKG, normal sinus rhythm, unchanged from previous tracings.   MR Cervical Spine Wo Contrast: 11/02/13  IMPRESSION:  1. Multifactorial degenerative cervical spinal  stenosis with up to moderate spinal cord mass effect at C6-C7, and severe biforaminal stenosis. No spinal cord signal abnormality.  2. Mild multifactorial degenerative spinal stenosis at C5-C6, and severe left foraminal stenosis.  3. Multilevel moderate to severe cervical facet arthropathy, superimposed on ankylosis of the right C3-C4 posterior elements. Subsequent moderate to severe foraminal stenosis also at the right C3, left C4, right C5, and left C8 nerve levels.   MR Lumbar Spine Wo Contrast: 11/02/13   IMPRESSION:  1. No acute or subacute injury identified. As in 2013 the salient abnormality in the lower spine is facet arthropathy, which is severe  on the right at L4-L5 and on the left at L3-L4 where both levels demonstrate mild marrow favored to reflect acute exacerbation of degenerative facet joint arthritis.  2. Mild disc degeneration limited to L3-L4 and L4-L5, no progression since 2013.  3. No lumbar spinal stenosis. Stable mild multifactorial right L4 foraminal stenosis. ejection fraction.  Medications: I have reviewed the patient's current medications. Scheduled Meds: . amLODipine  5 mg Oral Daily  . aspirin EC  81 mg Oral Daily  . divalproex  500 mg Oral BID  . enoxaparin (LOVENOX) injection  40 mg Subcutaneous Q24H  . lisinopril  5 mg Oral Daily  . metoprolol tartrate  25 mg Oral BID  . mirtazapine  30 mg Oral QHS  . pantoprazole  40 mg Oral Daily  . sertraline  75 mg Oral Daily  . 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 female with a past medical history of NSTEMI in the setting of cocaine use (06/2009), Bipolar disorder, HTN, HLD, TIA, polysubstance abuse, recurrent pyelonephritis, and back pain who presents with acute chest pain.  Acute chest Pain:  Patient presented with hypertensive emergency in the setting of SBP > 200 on presentation (216/100) and acute chest pain.   Cardiac work up was performed due to possibility of NSTEMI. She was troponin negative x 3, with unchanged EKG, and had a normal stress test.  Further history from the patient about onset of pain make anxiety  attacks a more likely source of her recurrent symptoms. Patient should follow up with her psychiatrist for anxiety management.  Cardiology consult stated she was ready for discharge from their standpoint. -- Disontinue telemetry monitoring   -- Continue aspirin 81 mg -- Continue amlodipine 5 mg  -- Continue metoprolol 25 mg  -- Continue lisinopril 5 mg  -- Continue simvastatin 20 mg -- Follow up with psychiatrist for anxiety management  Hypertension  Patient's systolic blood pressure has been elevated over SBP 200 for the past month. She has been controlled with metoprolol and lisinopril down to 179/75. Amlodipine was added to her regimen for better hypertension control. -- Continue amlodipine 5 mg  -- Continue metoprolol 25 mg  -- Continue lisinopril 5 mg   Chronic back pain:  Received IV morphine due to severe pain on admission, but will not continue due to history of opiate abuse. Managed pain control with ketorolac 30 mg/mL q6h PRN and tramadol 100 mg q6h.  Neurosurgery consult stated her lower back pain was  likely due to a myofascial pain syndrome, that she was not a candidate for surgery, and recommended outpatient epidural of C6-7 outpatient pain management. The patient is very against epidural injections as she has received these in the past.  We recommend pain management with a methadone clinic. The patient does not have the option of renewing a pain contract with the clinic, and this program would provide the patient with the best pain relief and controlled environment. -- Discharge with ribaxin PO and tramadol 100 mg for pain management -- Follow up with [facility] for methadone treatment  Flank pain and dysuria:  Patient presented with right CVA tenderness and dysuria and has a recent history of recurrent pyelonephritis and hematuria. U/A was negative for infection x2, but showed hematuria as expected given her ongoing urological pathology. --  Follow up with [facility]  Bipolar disorder:  -- Continue divalproex 250 mg DR   Depression:  -- Continue mirtazapine 30 mg  -- Continue sertraline 50 mg   Tobacco abuse:  Patient is a current smoker with a 45 pack year history, but has cut down to 3-4 cigarettes recently. It is important that the patient stop smoking with her CAD risk factors.  -- Provide educational and support resources for patient on discharge   DVT Prophylaxis:  -- Enoxaparin 40 mg   FEN/GI:   -- Healthy heart diet   This is a Careers information officer Note.  The care of the patient was discussed with Dr. Charlott Rakes and the assessment and plan formulated with their assistance.  Please see their attached note for official documentation of the daily encounter.   LOS: 2 days   Tommie Sams, Med Student 11/16/2013, 7:07 AM

## 2013-11-16 NOTE — Progress Notes (Signed)
  I have seen and examined the patient, and reviewed the daily progress note by Garret Reddish, MS 3 and discussed the care of the patient with them. Please see my progress note from 11/16/2013 for further details regarding assessment and plan.    Signed:  Charlott Rakes, MD 11/16/2013, 1:21 PM

## 2013-11-16 NOTE — Progress Notes (Signed)
*  PRELIMINARY RESULTS* Echocardiogram 2D Echocardiogram has been performed.  Leavy Cella 11/16/2013, 10:05 AM
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
## 2013-11-16 NOTE — Progress Notes (Signed)
  Date: 11/16/2013  Patient name: Brenda [name]  Medical record number: [id number]  Date of birth: 1960/07/29   This patient has been seen and the plan of care was discussed with the house staff. Please see their note for complete details. I concur with their findings with the following additions/corrections:  We spent significant amount of time discussing her pain management and have recommended the importance to stay within the contract established with pain clinic. She is willing to go to methadone clinic. She reiterates that non-opiates do not do the job for management of her pain, but she has had prior history of opiate dependence. We will continue with honoring the plan established by her pcp to attempt not to start back on opiates  Carlyle Basques, MD 11/16/2013, 9:13 PM
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
## 2013-11-16 NOTE — Care Management Note (Signed)
    Page 1 of 1   11/16/2013     3:26:27 PM CARE MANAGEMENT NOTE 11/16/2013  Patient:  Brenda [name], Brenda [name]   Account Number:  [id number]  Date Initiated:  11/16/2013  Documentation initiated by:  Marvetta Gibbons  Subjective/Objective Assessment:   Pt admitted with c/p     Action/Plan:   PTA pt homeless- pt states living with friends   Anticipated DC Date:  11/16/2013   Anticipated DC Plan:  HOME/SELF CARE  In-house referral  Clinical Social Worker      DC Planning Services  CM consult      Choice offered to / List presented to:             Status of service:  Completed, signed off Medicare Important Message given?  NO (If response is "NO", the following Medicare IM given date fields will be blank) Date Medicare IM given:   Medicare IM given by:   Date Additional Medicare IM given:   Additional Medicare IM given by:    Discharge Disposition:  HOME/SELF CARE  Per UR Regulation:  Reviewed for med. necessity/level of care/duration of stay  If discussed at [place] of Stay Meetings, dates discussed:    Comments:  11/16/12- 1330- Marvetta Gibbons RN, BSN [phone] Referral for medication needs- in to speak with pt who states that she does not have any money for meds- explained that we do  not have a program for free meds- in further discussion pt explained that she gets some meds from the [facility] she also has orange card and gets some meds from [place]. she also has some meds that are on $4 list. - pt [facility]- explained to pt that we could not assist with pain meds and even with our [facility] program she would still have $4 copays for her medications- pt voiced understanding - and will f/u with pain clinic and [facility] outpt clinic which is her PCP to further assess medication and potential less expensive options. CSW has provided pt with transportation vouchers- no further CM needs at this time.

## 2013-11-16 NOTE — Progress Notes (Addendum)
Clinical Social Work Department BRIEF PSYCHOSOCIAL ASSESSMENT 11/16/2013  Patient:  GLADYSE, CORVIN     Account Number:  [id number]     [place] date:  11/14/2013  Clinical Social Worker:  Adair Laundry  Date/Time:  11/16/2013 02:00 PM  Referred by:  Physician  Date Referred:  11/16/2013 Referred for  Homelessness  Transportation assistance   Other Referral:   Interview type:  Patient Other interview type:    PSYCHOSOCIAL DATA Living Status:  OTHER Admitted from facility:   Level of care:   Primary support name:  None Primary support relationship to patient:   Degree of support available:   Pt has very limited support and is currently staying at different friend's homes.    CURRENT CONCERNS Current Concerns  Other - See comment   Other Concerns:    SOCIAL WORK ASSESSMENT / PLAN CSW (Clinical Education officer, museum) aware that pt will have difficulty getting to appointment tomorrow at [place] spoke with supervisor and confirmed that pt could be provided with taxi voucher. CSW spoke with pt and provided with taxi voucher. Pt aware that voucher must be used to get from [place] location to clinic. CSW also spoke in length with pt about housing issues. Pt familiar with all shelters in [facility] and has made use of them at some point. Pt is currently staying with friends. CSW provided pt with information on [place]. CSW informed pt nurse that pt was provided with needed resources. At this time, pt has no further hospital social work needs. CSW signing off.   Assessment/plan status:  No Further Intervention Required Other assessment/ plan:   Information/referral to community resources:   [employer], [place] [address], Transportaion information    PATIENT'S/FAMILY'S RESPONSE TO PLAN OF CARE: Pt thankful for CSW information.      Shelly, [place]

## 2013-11-20 NOTE — Progress Notes (Signed)
I saw and evaluated the patient.  I personally confirmed the key portions of the history and exam documented by Dr. Gordy Levan and I reviewed pertinent patient test results.  The assessment, diagnosis, and plan were formulated together and I agree with the documentation in the resident's note.

## 2013-11-23 ENCOUNTER — Encounter: Payer: Self-pay | Admitting: Licensed Clinical Social Worker

## 2013-11-23 ENCOUNTER — Ambulatory Visit (INDEPENDENT_AMBULATORY_CARE_PROVIDER_SITE_OTHER): Payer: No Typology Code available for payment source | Admitting: Internal Medicine

## 2013-11-23 ENCOUNTER — Encounter: Payer: Self-pay | Admitting: Internal Medicine

## 2013-11-23 VITALS — BP 138/80 | HR 61 | Temp 97.6°F | Ht 63.0 in | Wt 129.9 lb

## 2013-11-23 DIAGNOSIS — I2510 Atherosclerotic heart disease of native coronary artery without angina pectoris: Secondary | ICD-10-CM

## 2013-11-23 DIAGNOSIS — F17200 Nicotine dependence, unspecified, uncomplicated: Secondary | ICD-10-CM

## 2013-11-23 DIAGNOSIS — M549 Dorsalgia, unspecified: Secondary | ICD-10-CM

## 2013-11-23 DIAGNOSIS — I10 Essential (primary) hypertension: Secondary | ICD-10-CM

## 2013-11-23 DIAGNOSIS — G8929 Other chronic pain: Secondary | ICD-10-CM

## 2013-11-23 DIAGNOSIS — Z720 Tobacco use: Secondary | ICD-10-CM

## 2013-11-23 NOTE — Assessment & Plan Note (Addendum)
Pt reports going to the methadone clinic but being unable to afford the medication (reports it is $1/day).  She was referred there during her last hospitalization (see assessment and plan for chest pain).  She unfortunately let her orange card expire and therefore further referrals cannot be made until she completes the application for a new one.  Golden Hurter, CSW, spoke with her about additional options including [facility] which has a methadone clinic.  Additionally, psychiatric issues are playing a large role in her chronic pain which needs to be addressed.  She reports being followed by [facility]. -continue to follow up with [facility] -referred pt to [facility] -complete orange card and could possibly be referred to a pain clinic?

## 2013-11-23 NOTE — Assessment & Plan Note (Signed)
Pt presents for hospital follow up for ACS rule out.  She denies any further episodes of chest pain.  Has a h/o cocaine abuse but UDS was neg.  Cardiology consulted and felt non-cardiac CP with neg troponins x 3, EKG, telemetry, echo, nuclear stress test. She reports compliance with meds today including ASA 81 mg, simvastatin 20 mg, metoprolol 25 mg, lisinopril 5 mg.  Amlodipine 5mg  was added per cards recommendation.  Chest pain felt to be related to anxiety and further management by psychiatry would be in order.  Pt was tearful today when I brought up the possibility that her chronic pain is worsened by anxiety/depression/bipolar.  She accused me of being "biased" against patients with mental health issues.  I assured her that I was not but that I felt that a large part of her chronic pain was due to her underlying psychiatric issues.  She reports seeing someone at [facility] on a regular basis.  Also states that she is treated differently because she does not have insurance and that if she had insurance she would be treated differently.  I assured her that this clinic does not treat patients differently based on ability to pay.  She had been referred to a methadone clinic but states that she cannot afford the copayment of $1/day.  Unfortunately her orange card has expired and she cannot be referred to a pain clinic at this time.   -continue current management -encouraged her to complete the orange card application and follow up with Marlana Latus  -encouraged follow-up with [facility] spoke with pt about additional services including alcohol and drug services treatment center which also offers methadone
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
## 2013-11-23 NOTE — Progress Notes (Signed)
CSW met with Brenda [name] during her scheduled [facility] appointment.  Pt was referred to [facility] upon discharge.  Brenda [name] was under the impression [facility] referral was to address her pain management with Methadone.  However, upon arriving to [facility], pt states she was told she is inappropriate for their program.  [facility] is a private pay program that assists with Opiate Dependence using methadone.  This treatment has a cost of $1 for day 1-30 and then  $14/day.  Pt was most likely declined as she denies Opiate use only marijuana to help alleviate her pain due to not being prescribed any pain medication.  Brenda [name] states she was current at [facility] and was receiving epidural injections.  Brenda [name] stopped because "all they wanted to do was give me injections".  Pt states she was not dismissed from this clinic.  CSW discussed possibility of referral back to this Clinic once pt's obtains [facility].  Brenda [name] will work on getting her [facility] card to obtain access to care, completed [id number] while in office today.  CSW faxed.  Pt provided with bus pass today and was made aware [facility] card provides free medical transportation.  Pt to work on gathering all necessary documentation.  Brenda [name] is aware of [facility] resources.  CSW discussed [facility]) as the utilize sliding fee scale.  Pt may be inappropriate for their Methadone program as pt denies opiate use.  CSW discussed [facility] to address inappropriate use of marijuana.   Brenda [name] is aware CSW is available to assist as needed and hours available.  Pt requesting to meet with CSW when she comes back to [facility] for financial counselor.  Pt aware of CSW hours and availability.

## 2013-11-23 NOTE — Assessment & Plan Note (Signed)
BP well controlled, 138/80. -continue current meds of metoprolol 25mg  bid, lisinopril 5mg , and amlodipine 5mg 

## 2013-11-23 NOTE — Assessment & Plan Note (Signed)
-  continues to smoke 5-6 cig/day; encouraged smoking cessation but is not ready to quit; refused the quitline number

## 2013-11-23 NOTE — Progress Notes (Signed)
Patient ID: Brenda [name], female   DOB: January 17, 1961, 53 y.o.   MRN: [id number]  Case discussed with Dr. Gordy Levan at the time of the visit.  We reviewed the resident's history and exam and pertinent patient test results.  I agree with the assessment, diagnosis, and plan of care documented in the resident's note.

## 2013-11-23 NOTE — Patient Instructions (Signed)
Thank you for your visit today.   Please return to the internal medicine clinic as needed.     Please be sure to make an appointment with Bonna Gains in order for you to complete the orange card. Consider going to the alcohol and drug services which [facility]ter gave you information about.    Your current medical regimen is effective;  continue present plan and take all medications as prescribed.    You need the following test(s) for regular health maintenance: mammogram.   Please be sure to bring all of your medications with you to every visit; this includes herbal supplements, vitamins, eye drops, and any over-the-counter medications.   Should you have any questions regarding your medications and/or any new or worsening symptoms, please be sure to call the clinic at [phone].   If you believe that you are suffering from a life threatening condition or one that may result in the loss of limb or function, then you should call 911 or proceed to the nearest Emergency Department.     A healthy lifestyle and preventative care can promote health and wellness.   Maintain regular health, dental, and eye exams.  Eat a healthy diet. Foods like vegetables, fruits, whole grains, low-fat dairy products, and lean protein foods contain the nutrients you need without too many calories. Decrease your intake of foods high in solid fats, added sugars, and salt. Get information about a proper diet from your caregiver, if necessary.  Regular physical exercise is one of the most important things you can do for your health. Most adults should get at least 150 minutes of moderate-intensity exercise (any activity that increases your heart rate and causes you to sweat) each week. In addition, most adults need muscle-strengthening exercises on 2 or more days a week.   Maintain a healthy weight. The body mass index (BMI) is a screening tool to identify possible weight problems. It provides an estimate of body  fat based on height and weight. Your caregiver can help determine your BMI, and can help you achieve or maintain a healthy weight. For adults 20 years and older:  A BMI below 18.5 is considered underweight.  A BMI of 18.5 to 24.9 is normal.  A BMI of 25 to 29.9 is considered overweight.  A BMI of 30 and above is considered obese.

## 2013-11-24 ENCOUNTER — Other Ambulatory Visit: Payer: Self-pay | Admitting: Internal Medicine

## 2013-11-24 DIAGNOSIS — G8929 Other chronic pain: Secondary | ICD-10-CM

## 2013-11-24 DIAGNOSIS — M549 Dorsalgia, unspecified: Principal | ICD-10-CM

## 2013-11-27 ENCOUNTER — Encounter: Payer: Self-pay | Admitting: Internal Medicine

## 2013-11-27 DIAGNOSIS — N3020 Other chronic cystitis without hematuria: Secondary | ICD-10-CM

## 2013-11-28 NOTE — Addendum Note (Signed)
Addended by: Hulan Fray on: 11/28/2013 05:57 PM   Modules accepted: Orders

## 2013-12-06 ENCOUNTER — Ambulatory Visit: Payer: Self-pay | Admitting: Internal Medicine

## 2013-12-19 ENCOUNTER — Encounter: Payer: Self-pay | Admitting: *Deleted

## 2013-12-27 ENCOUNTER — Emergency Department (HOSPITAL_COMMUNITY)
Admission: EM | Admit: 2013-12-27 | Discharge: 2013-12-27 | Disposition: A | Discharge status: Home / Self Care | Payer: No Typology Code available for payment source | Attending: Emergency Medicine | Admitting: Emergency Medicine

## 2013-12-27 ENCOUNTER — Encounter (HOSPITAL_COMMUNITY): Payer: Self-pay | Admitting: Emergency Medicine

## 2013-12-27 ENCOUNTER — Emergency Department (HOSPITAL_COMMUNITY): Payer: No Typology Code available for payment source

## 2013-12-27 DIAGNOSIS — M129 Arthropathy, unspecified: Secondary | ICD-10-CM | POA: Insufficient documentation

## 2013-12-27 DIAGNOSIS — W010XXA Fall on same level from slipping, tripping and stumbling without subsequent striking against object, initial encounter: Secondary | ICD-10-CM | POA: Insufficient documentation

## 2013-12-27 DIAGNOSIS — Z9889 Other specified postprocedural states: Secondary | ICD-10-CM | POA: Insufficient documentation

## 2013-12-27 DIAGNOSIS — S9030XA Contusion of unspecified foot, initial encounter: Secondary | ICD-10-CM | POA: Insufficient documentation

## 2013-12-27 DIAGNOSIS — S335XXA Sprain of ligaments of lumbar spine, initial encounter: Secondary | ICD-10-CM | POA: Insufficient documentation

## 2013-12-27 DIAGNOSIS — F411 Generalized anxiety disorder: Secondary | ICD-10-CM | POA: Insufficient documentation

## 2013-12-27 DIAGNOSIS — Z87440 Personal history of urinary (tract) infections: Secondary | ICD-10-CM | POA: Insufficient documentation

## 2013-12-27 DIAGNOSIS — S9031XA Contusion of right foot, initial encounter: Secondary | ICD-10-CM

## 2013-12-27 DIAGNOSIS — Y92410 Unspecified street and highway as the place of occurrence of the external cause: Secondary | ICD-10-CM | POA: Insufficient documentation

## 2013-12-27 DIAGNOSIS — I252 Old myocardial infarction: Secondary | ICD-10-CM | POA: Insufficient documentation

## 2013-12-27 DIAGNOSIS — F4310 Post-traumatic stress disorder, unspecified: Secondary | ICD-10-CM | POA: Insufficient documentation

## 2013-12-27 DIAGNOSIS — S39012A Strain of muscle, fascia and tendon of lower back, initial encounter: Secondary | ICD-10-CM

## 2013-12-27 DIAGNOSIS — W1809XA Striking against other object with subsequent fall, initial encounter: Secondary | ICD-10-CM | POA: Insufficient documentation

## 2013-12-27 DIAGNOSIS — Z79899 Other long term (current) drug therapy: Secondary | ICD-10-CM | POA: Insufficient documentation

## 2013-12-27 DIAGNOSIS — Z8673 Personal history of transient ischemic attack (TIA), and cerebral infarction without residual deficits: Secondary | ICD-10-CM | POA: Insufficient documentation

## 2013-12-27 DIAGNOSIS — E785 Hyperlipidemia, unspecified: Secondary | ICD-10-CM | POA: Insufficient documentation

## 2013-12-27 DIAGNOSIS — G8929 Other chronic pain: Secondary | ICD-10-CM | POA: Insufficient documentation

## 2013-12-27 DIAGNOSIS — IMO0002 Reserved for concepts with insufficient information to code with codable children: Secondary | ICD-10-CM | POA: Insufficient documentation

## 2013-12-27 DIAGNOSIS — Z8742 Personal history of other diseases of the female genital tract: Secondary | ICD-10-CM | POA: Insufficient documentation

## 2013-12-27 DIAGNOSIS — W108XXA Fall (on) (from) other stairs and steps, initial encounter: Secondary | ICD-10-CM | POA: Insufficient documentation

## 2013-12-27 DIAGNOSIS — F17200 Nicotine dependence, unspecified, uncomplicated: Secondary | ICD-10-CM | POA: Insufficient documentation

## 2013-12-27 DIAGNOSIS — I509 Heart failure, unspecified: Secondary | ICD-10-CM | POA: Insufficient documentation

## 2013-12-27 DIAGNOSIS — Y9301 Activity, walking, marching and hiking: Secondary | ICD-10-CM | POA: Insufficient documentation

## 2013-12-27 DIAGNOSIS — F319 Bipolar disorder, unspecified: Secondary | ICD-10-CM | POA: Insufficient documentation

## 2013-12-27 DIAGNOSIS — I2510 Atherosclerotic heart disease of native coronary artery without angina pectoris: Secondary | ICD-10-CM | POA: Insufficient documentation

## 2013-12-27 DIAGNOSIS — D649 Anemia, unspecified: Secondary | ICD-10-CM | POA: Insufficient documentation

## 2013-12-27 DIAGNOSIS — Z8619 Personal history of other infectious and parasitic diseases: Secondary | ICD-10-CM | POA: Insufficient documentation

## 2013-12-27 DIAGNOSIS — Z8701 Personal history of pneumonia (recurrent): Secondary | ICD-10-CM | POA: Insufficient documentation

## 2013-12-27 DIAGNOSIS — K589 Irritable bowel syndrome without diarrhea: Secondary | ICD-10-CM | POA: Insufficient documentation

## 2013-12-27 DIAGNOSIS — I10 Essential (primary) hypertension: Secondary | ICD-10-CM | POA: Insufficient documentation

## 2013-12-27 DIAGNOSIS — Z7982 Long term (current) use of aspirin: Secondary | ICD-10-CM | POA: Insufficient documentation

## 2013-12-27 MED ORDER — IBUPROFEN 600 MG PO TABS: 600.0000 mg | ORAL_TABLET | Freq: Four times a day (QID) | ORAL | Status: DC | PRN

## 2013-12-27 MED ORDER — OXYCODONE-ACETAMINOPHEN 5-325 MG PO TABS
1.0000 | ORAL_TABLET | Freq: Once | ORAL | Status: AC
  Administered 2013-12-27: 1 via ORAL
  Filled 2013-12-27: qty 1

## 2013-12-27 MED ORDER — TRAMADOL HCL 50 MG PO TABS: 50.0000 mg | ORAL_TABLET | Freq: Four times a day (QID) | ORAL | Status: DC | PRN

## 2013-12-27 MED ORDER — KETOROLAC TROMETHAMINE 60 MG/2ML IM SOLN
60.0000 mg | Freq: Once | INTRAMUSCULAR | Status: AC
  Administered 2013-12-27: 60 mg via INTRAMUSCULAR
  Filled 2013-12-27: qty 2

## 2013-12-27 NOTE — ED Notes (Signed)
Pt in after a fall today while getting off of a [employer] bus, states her shoe got caught and she landed with her back on the steps into the bus, also c/o right heel pain that is worse when walking
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
## 2013-12-27 NOTE — Discharge Instructions (Signed)
ibuprofen for pain. Ultram for severe pain. Ice. Elevate your foot. Follow up with your doctor.   Back Pain, Adult Low back pain is very common. About 1 in 5 people have back pain.The cause of low back pain is rarely dangerous. The pain often gets better over time.About half of people with a sudden onset of back pain feel better in just 2 weeks. About 8 in 10 people feel better by 6 weeks.  CAUSES Some common causes of back pain include:  Strain of the muscles or ligaments supporting the spine.  Wear and tear (degeneration) of the spinal discs.  Arthritis.  Direct injury to the back. DIAGNOSIS Most of the time, the direct cause of low back pain is not known.However, back pain can be treated effectively even when the exact cause of the pain is unknown.Answering your caregiver's questions about your overall health and symptoms is one of the most accurate ways to make sure the cause of your pain is not dangerous. If your caregiver needs more information, he or she may order lab work or imaging tests (X-rays or MRIs).However, even if imaging tests show changes in your back, this usually does not require surgery. HOME CARE INSTRUCTIONS For many people, back pain returns.Since low back pain is rarely dangerous, it is often a condition that people can learn to [facility] their own.   Remain active. It is stressful on the back to sit or stand in one place. Do not sit, drive, or stand in one place for more than 30 minutes at a time. Take short walks on level surfaces as soon as pain allows.Try to increase the length of time you walk each day.  Do not stay in bed.Resting more than 1 or 2 days can delay your recovery.  Do not avoid exercise or work.Your body is made to move.It is not dangerous to be active, even though your back may hurt.Your back will likely heal faster if you return to being active before your pain is gone.  Pay attention to your body when you bend and lift. Many people  have less discomfortwhen lifting if they bend their knees, keep the load close to their bodies,and avoid twisting. Often, the most comfortable positions are those that put less stress on your recovering back.  Find a comfortable position to sleep. Use a firm mattress and lie on your side with your knees slightly bent. If you lie on your back, put a pillow under your knees.  Only take over-the-counter or prescription medicines as directed by your caregiver. Over-the-counter medicines to reduce pain and inflammation are often the most helpful.Your caregiver may prescribe muscle relaxant drugs.These medicines help dull your pain so you can more quickly return to your normal activities and healthy exercise.  Put ice on the injured area.  Put ice in a plastic bag.  Place a towel between your skin and the bag.  Leave the ice on for 15-20 minutes, 03-04 times a day for the first 2 to 3 days. After that, ice and heat may be alternated to reduce pain and spasms.  Ask your caregiver about trying back exercises and gentle massage. This may be of some benefit.  Avoid feeling anxious or stressed.Stress increases muscle tension and can worsen back pain.It is important to recognize when you are anxious or stressed and learn ways to manage it.Exercise is a great option. SEEK MEDICAL CARE IF:  You have pain that is not relieved with rest or medicine.  You have pain that does  not improve in 1 week.  You have new symptoms.  You are generally not feeling well. SEEK IMMEDIATE MEDICAL CARE IF:   You have pain that radiates from your back into your legs.  You develop new bowel or bladder control problems.  You have unusual weakness or numbness in your arms or legs.  You develop nausea or vomiting.  You develop abdominal pain.  You feel faint. Document Released: 03/30/2005 Document Revised: 09/29/2011 Document Reviewed: 08/01/2013 [facility] Patient Information 2015 [place], [place]. This  information is not intended to replace advice given to you by your health care provider. Make sure you discuss any questions you have with your health care provider.

## 2013-12-27 NOTE — ED Notes (Signed)
CSW provided pt with bus pass for transportation to [facility].  CSW also left referral for RNCM re: pt's request for help with medication.  RNCM asked to f/u with pt in am.

## 2013-12-28 NOTE — Progress Notes (Signed)
This CM received a message to follow up with the patient regarding request for medication assist and the patient is residing at the [facility] and can be contacted at [phone]. This CM contacted the [facility] and requested to speak with the patient. This CM was informed that the patient could not be located and this CM contact information and name were taken as a message for patient to return call.  Edwyna Shell, RN, BSN, Case Manager 12/28/2013 10:05 AM

## 2014-01-02 ENCOUNTER — Encounter: Payer: Self-pay | Admitting: Licensed Clinical Social Worker

## 2014-01-03 NOTE — Progress Notes (Signed)
Referral to [facility] faxed on 01/03/14.  Pt in agreement with referral and was provided care manager contact name.

## 2014-01-03 NOTE — Progress Notes (Signed)
Ms. Brenda [name] requesting to meet with CSW.  Pt is here to complete her [facility] card application.  CSW encouraged pt to meet with CSW after appointment with Development worker, community.  CSW met with pt after pt obtain her [facility] card from [employer] [facility].  Pt states [facility] currently has a 4 week wait list for their methadone program.  Pt hesitant for methadone treatment for pain as pt has heard from other of difficulty getting off methadone.  Brenda [name] informed CSW she was called for a referral to a Pain Center at [facility] but lacks transportation.  CSW informed Brenda [name], transportation could be arranged through [facility], as a new program.  Pt agreeable for referral to [facility] for medical transportation.  Application completed and faxed.  Pt requesting CSW to text her pick up information as she only has 3 minutes left on her phone. Brenda [name] is currently staying a [facility] for shelter, but would like to find alternate housing options as she states the majority of residents at [facility] are female and recently released from jail.  CSW left message for both [facility] regarding availability.  No return calls as of yet.  CSW discussed the Supportive Housing Program through [facility], will contact agency to confirm program is still available.  Pt given information and requested to obtain a letter from [facility] regarding her placement there and date of admission.  Brenda [name] states she will request letter and either mail or fax to [place].   Transportation scheduled for both [facility] and ID appointments.  CSW texted pick up times to pt utilizing computer text app.  Pick up time 01/08/14 - 12:25-1:05, pick up 01/09/14 8:55-9:35.

## 2014-01-04 NOTE — ED Provider Notes (Signed)
Medical screening examination/treatment/procedure(s) were performed by non-physician practitioner and as supervising physician I was immediately available for consultation/collaboration.   EKG Interpretation None        [name], MD 01/04/14 [phone]

## 2014-01-08 ENCOUNTER — Ambulatory Visit (INDEPENDENT_AMBULATORY_CARE_PROVIDER_SITE_OTHER): Payer: No Typology Code available for payment source | Admitting: Internal Medicine

## 2014-01-08 ENCOUNTER — Encounter: Payer: Self-pay | Admitting: Internal Medicine

## 2014-01-08 ENCOUNTER — Ambulatory Visit: Payer: Self-pay | Admitting: Internal Medicine

## 2014-01-08 VITALS — BP 191/84 | HR 80 | Temp 98.6°F | Wt 127.7 lb

## 2014-01-08 DIAGNOSIS — Z5900 Homelessness unspecified: Secondary | ICD-10-CM

## 2014-01-08 DIAGNOSIS — F17200 Nicotine dependence, unspecified, uncomplicated: Secondary | ICD-10-CM

## 2014-01-08 DIAGNOSIS — F3289 Other specified depressive episodes: Secondary | ICD-10-CM

## 2014-01-08 DIAGNOSIS — R319 Hematuria, unspecified: Secondary | ICD-10-CM

## 2014-01-08 DIAGNOSIS — F3110 Bipolar disorder, current episode manic without psychotic features, unspecified: Secondary | ICD-10-CM

## 2014-01-08 DIAGNOSIS — E785 Hyperlipidemia, unspecified: Secondary | ICD-10-CM

## 2014-01-08 DIAGNOSIS — G894 Chronic pain syndrome: Secondary | ICD-10-CM

## 2014-01-08 DIAGNOSIS — K589 Irritable bowel syndrome without diarrhea: Secondary | ICD-10-CM

## 2014-01-08 DIAGNOSIS — K219 Gastro-esophageal reflux disease without esophagitis: Secondary | ICD-10-CM

## 2014-01-08 DIAGNOSIS — Z720 Tobacco use: Secondary | ICD-10-CM

## 2014-01-08 DIAGNOSIS — I10 Essential (primary) hypertension: Secondary | ICD-10-CM

## 2014-01-08 DIAGNOSIS — Z0000 Encounter for general adult medical examination without abnormal findings: Secondary | ICD-10-CM

## 2014-01-08 DIAGNOSIS — F329 Major depressive disorder, single episode, unspecified: Secondary | ICD-10-CM

## 2014-01-08 DIAGNOSIS — R1011 Right upper quadrant pain: Secondary | ICD-10-CM

## 2014-01-08 DIAGNOSIS — F1910 Other psychoactive substance abuse, uncomplicated: Secondary | ICD-10-CM

## 2014-01-08 MED ORDER — METHOCARBAMOL 500 MG PO TABS: 500.0000 mg | ORAL_TABLET | Freq: Four times a day (QID) | ORAL | Status: DC | PRN

## 2014-01-08 MED ORDER — LISINOPRIL 5 MG PO TABS: 5.0000 mg | ORAL_TABLET | Freq: Every day | ORAL | Status: DC

## 2014-01-08 MED ORDER — DICYCLOMINE HCL 20 MG PO TABS: 20.0000 mg | ORAL_TABLET | Freq: Three times a day (TID) | ORAL | Status: DC

## 2014-01-08 MED ORDER — ASPIRIN EC 81 MG PO TBEC: 81.0000 mg | DELAYED_RELEASE_TABLET | Freq: Every day | ORAL | Status: DC

## 2014-01-08 MED ORDER — DIVALPROEX SODIUM 250 MG PO DR TAB: 500.0000 mg | DELAYED_RELEASE_TABLET | Freq: Two times a day (BID) | ORAL | Status: DC

## 2014-01-08 MED ORDER — METOPROLOL TARTRATE 25 MG PO TABS: 25.0000 mg | ORAL_TABLET | Freq: Two times a day (BID) | ORAL | Status: DC

## 2014-01-08 MED ORDER — SIMVASTATIN 20 MG PO TABS: 20.0000 mg | ORAL_TABLET | Freq: Every evening | ORAL | Status: DC

## 2014-01-08 MED ORDER — SERTRALINE HCL 50 MG PO TABS: 75.0000 mg | ORAL_TABLET | Freq: Every day | ORAL | Status: DC

## 2014-01-08 MED ORDER — RANITIDINE HCL 150 MG PO CAPS: 150.0000 mg | ORAL_CAPSULE | Freq: Two times a day (BID) | ORAL | Status: DC

## 2014-01-08 MED ORDER — AMLODIPINE BESYLATE 5 MG PO TABS: 10.0000 mg | ORAL_TABLET | Freq: Every day | ORAL | Status: DC

## 2014-01-08 MED ORDER — TRAMADOL HCL 50 MG PO TABS: 50.0000 mg | ORAL_TABLET | Freq: Four times a day (QID) | ORAL | Status: DC | PRN

## 2014-01-08 NOTE — Patient Instructions (Signed)
General Instructions: -Start taking amlodipine 10mg  daily (two tablets of the 5mg  every day).  -You may call [facility] (call the main number, [phone]) for updates about the pain clinic.  -You have been referred to Urology.  -You have been referred to Gastroenterology.  -Follow up in 2 weeks for blood pressure recheck.    Treatment Goals:  Goals (1 Years of Data) as of 01/08/14         As of Today As of Today 12/27/13 12/27/13 11/23/13     Blood Pressure    . Blood Pressure < 140/90  191/84 191/84 148/65 128/83 138/80      Progress Toward Treatment Goals:  Treatment Goal 01/08/2014  Blood pressure deteriorated  Stop smoking smoking less  Prevent falls -    Self Care Goals & Plans:  Self Care Goal 01/08/2014  Manage my medications take my medicines as prescribed; bring my medications to every visit; refill my medications on time  Monitor my health -  Eat healthy foods -  Be physically active -  Stop smoking call [facility] (1-800-QUIT-NOW)  Prevent falls -  Meeting treatment goals maintain the current self-care plan    No flowsheet data found.   Care Management & Community Referrals:  Referral 10/24/2013  Referrals made for care management support -  Referrals made to community resources none     Smoking Cessation Quitting smoking is important to your health and has many advantages. However, it is not always easy to quit since nicotine is a very addictive drug. Oftentimes, people try 3 times or more before being able to quit. This document explains the best ways for you to prepare to quit smoking. Quitting takes hard work and a lot of effort, but you can do it. ADVANTAGES OF QUITTING SMOKING  You will live longer, feel better, and live better.  Your body will feel the impact of quitting smoking almost immediately.  Within 20 minutes, blood pressure decreases. Your pulse returns to its normal level.  After 8 hours, carbon monoxide levels in the blood return to  normal. Your oxygen level increases.  After 24 hours, the chance of having a heart attack starts to decrease. Your breath, hair, and body stop smelling like smoke.  After 48 hours, damaged nerve endings begin to recover. Your sense of taste and smell improve.  After 72 hours, the body is virtually free of nicotine. Your bronchial tubes relax and breathing becomes easier.  After 2 to 12 weeks, lungs can hold more air. Exercise becomes easier and circulation improves.  The risk of having a heart attack, stroke, cancer, or lung disease is greatly reduced.  After 1 year, the risk of coronary heart disease is cut in half.  After 5 years, the risk of stroke falls to the same as a nonsmoker.  After 10 years, the risk of lung cancer is cut in half and the risk of other cancers decreases significantly.  After 15 years, the risk of coronary heart disease drops, usually to the level of a nonsmoker.  If you are pregnant, quitting smoking will improve your chances of having a healthy baby.  The people you live with, especially any children, will be healthier.  You will have extra money to spend on things other than cigarettes. QUESTIONS TO THINK ABOUT BEFORE ATTEMPTING TO QUIT You may want to talk about your answers with your health care provider.  Why do you want to quit?  If you tried to quit in the past, what helped  and what did not?  What will be the most difficult situations for you after you quit? How will you plan to handle them?  Who can help you through the tough times? Your family? Friends? A health care provider?  What pleasures do you get from smoking? What ways can you still get pleasure if you quit? Here are some questions to ask your health care provider:  How can you help me to be successful at quitting?  What medicine do you think would be best for me and how should I take it?  What should I do if I need more help?  What is smoking withdrawal like? How can I get  information on withdrawal? GET READY  Set a quit date.  Change your environment by getting rid of all cigarettes, ashtrays, matches, and lighters in your home, car, or work. Do not let people smoke in your home.  Review your past attempts to quit. Think about what worked and what did not. GET SUPPORT AND ENCOURAGEMENT You have a better chance of being successful if you have help. You can get support in many ways.  Tell your family, friends, and coworkers that you are going to quit and need their support. Ask them not to smoke around you.  Get individual, group, or telephone counseling and support. Programs are available at [employer] and health centers. Call your local health department for information about programs in your area.  Spiritual beliefs and practices may help some smokers quit.  Download a "quit meter" on your computer to keep track of quit statistics, such as how long you have gone without smoking, cigarettes not smoked, and money saved.  Get a self-help book about quitting smoking and staying off tobacco. [place] yourself from urges to smoke. Talk to someone, go for a walk, or occupy your time with a task.  Change your normal routine. Take a different route to work. Drink tea instead of coffee. Eat breakfast in a different place.  Reduce your stress. Take a hot bath, exercise, or read a book.  Plan something enjoyable to do every day. Reward yourself for not smoking.  Explore interactive web-based programs that specialize in helping you quit. GET MEDICINE AND USE IT CORRECTLY Medicines can help you stop smoking and decrease the urge to smoke. Combining medicine with the above behavioral methods and support can greatly increase your chances of successfully quitting smoking.  Nicotine replacement therapy helps deliver nicotine to your body without the negative effects and risks of smoking. Nicotine replacement therapy includes  nicotine gum, lozenges, inhalers, nasal sprays, and skin patches. Some may be available over-the-counter and others require a prescription.  Antidepressant medicine helps people abstain from smoking, but how this works is unknown. This medicine is available by prescription.  Nicotinic receptor partial agonist medicine simulates the effect of nicotine in your brain. This medicine is available by prescription. Ask your health care provider for advice about which medicines to use and how to use them based on your health history. Your health care provider will tell you what side effects to look out for if you choose to be on a medicine or therapy. Carefully read the information on the package. Do not use any other product containing nicotine while using a nicotine replacement product.  RELAPSE OR DIFFICULT SITUATIONS Most relapses occur within the first 3 months after quitting. Do not be discouraged if you start smoking again. Remember, most people try several times before finally  quitting. You may have symptoms of withdrawal because your body is used to nicotine. You may crave cigarettes, be irritable, feel very hungry, cough often, get headaches, or have difficulty concentrating. The withdrawal symptoms are only temporary. They are strongest when you first quit, but they will go away within 10-14 days. To reduce the chances of relapse, try to:  Avoid drinking alcohol. Drinking lowers your chances of successfully quitting.  Reduce the amount of caffeine you consume. Once you quit smoking, the amount of caffeine in your body increases and can give you symptoms, such as a rapid heartbeat, sweating, and anxiety.  Avoid smokers because they can make you want to smoke.  Do not let weight gain distract you. Many smokers will gain weight when they quit, usually less than 10 pounds. Eat a healthy diet and stay active. You can always lose the weight gained after you quit.  Find ways to improve your mood other  than smoking. FOR MORE INFORMATION  www.smokefree.gov  Document Released: 03/24/2001 Document Revised: 08/14/2013 Document Reviewed: 07/09/2011 [facility] Patient Information 2015 [place], [place]. This information is not intended to replace advice given to you by your health care provider. Make sure you discuss any questions you have with your health care provider.

## 2014-01-09 ENCOUNTER — Ambulatory Visit (INDEPENDENT_AMBULATORY_CARE_PROVIDER_SITE_OTHER): Payer: No Typology Code available for payment source | Admitting: Internal Medicine

## 2014-01-09 ENCOUNTER — Encounter: Payer: Self-pay | Admitting: Internal Medicine

## 2014-01-09 VITALS — BP 173/94 | HR 80 | Temp 98.8°F | Wt 127.0 lb

## 2014-01-09 DIAGNOSIS — R109 Unspecified abdominal pain: Secondary | ICD-10-CM

## 2014-01-09 DIAGNOSIS — G8929 Other chronic pain: Secondary | ICD-10-CM

## 2014-01-09 LAB — CBC WITH DIFFERENTIAL/PLATELET
BASOS ABS: 0 10*3/uL (ref 0.0–0.1)
Basophils Relative: 0 % (ref 0–1)
Eosinophils Absolute: 0.2 10*3/uL (ref 0.0–0.7)
Eosinophils Relative: 2 % (ref 0–5)
HEMATOCRIT: 37.6 % (ref 36.0–46.0)
Hemoglobin: 12.7 g/dL (ref 12.0–15.0)
LYMPHS ABS: 3.3 10*3/uL (ref 0.7–4.0)
LYMPHS PCT: 32 % (ref 12–46)
MCH: 32.7 pg (ref 26.0–34.0)
MCHC: 33.8 g/dL (ref 30.0–36.0)
MCV: 96.9 fL (ref 78.0–100.0)
MONO ABS: 0.5 10*3/uL (ref 0.1–1.0)
Monocytes Relative: 5 % (ref 3–12)
Neutro Abs: 6.3 10*3/uL (ref 1.7–7.7)
Neutrophils Relative %: 61 % (ref 43–77)
PLATELETS: 245 10*3/uL (ref 150–400)
RBC: 3.88 MIL/uL (ref 3.87–5.11)
RDW: 14.5 % (ref 11.5–15.5)
WBC: 10.4 10*3/uL (ref 4.0–10.5)
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
## 2014-01-10 ENCOUNTER — Telehealth: Payer: Self-pay | Admitting: *Deleted

## 2014-01-10 DIAGNOSIS — Z5900 Homelessness unspecified: Secondary | ICD-10-CM | POA: Insufficient documentation

## 2014-01-10 DIAGNOSIS — K219 Gastro-esophageal reflux disease without esophagitis: Secondary | ICD-10-CM | POA: Insufficient documentation

## 2014-01-10 DIAGNOSIS — K589 Irritable bowel syndrome without diarrhea: Secondary | ICD-10-CM | POA: Insufficient documentation

## 2014-01-10 LAB — URINALYSIS, ROUTINE W REFLEX MICROSCOPIC
Bilirubin Urine: NEGATIVE
GLUCOSE, UA: NEGATIVE mg/dL
KETONES UR: NEGATIVE mg/dL
Leukocytes, UA: NEGATIVE
Nitrite: NEGATIVE
Protein, ur: NEGATIVE mg/dL
Specific Gravity, Urine: 1.026 (ref 1.005–1.030)
Urobilinogen, UA: 0.2 mg/dL (ref 0.0–1.0)
pH: 5.5 (ref 5.0–8.0)

## 2014-01-10 LAB — URINE CULTURE
Colony Count: NO GROWTH
ORGANISM ID, BACTERIA: NO GROWTH

## 2014-01-10 LAB — URINALYSIS, MICROSCOPIC ONLY
Bacteria, UA: NONE SEEN
CASTS: NONE SEEN
Crystals: NONE SEEN

## 2014-01-10 NOTE — Assessment & Plan Note (Signed)
  Assessment: Progress toward smoking cessation:  smoking less Barriers to progress toward smoking cessation:    stress, pain Comments: She is cutting back  Plan: Instruction/counseling given:  I counseled patient on the dangers of tobacco use, advised patient to stop smoking, and reviewed strategies to maximize success. Educational resources provided:    Self management tools provided:    Medications to assist with smoking cessation:  None Patient agreed to the following self-care plans for smoking cessation: call [facility] (1-800-QUIT-NOW)  Other plans: Continue counseling

## 2014-01-10 NOTE — Assessment & Plan Note (Signed)
No muscle aches. Refilled statin

## 2014-01-10 NOTE — Assessment & Plan Note (Signed)
Followed at [facility], refilled Depakote--pt states that they used to give her this medication but they now want to charge for this and she is not able to afford it.

## 2014-01-10 NOTE — Telephone Encounter (Signed)
If the patient calls in please advise results were normal and that Dr Baxter Flattery thinks it is Pyelo. The patient has no number on the chart and the emergency contact advised the patient does not live there.

## 2014-01-10 NOTE — Assessment & Plan Note (Addendum)
Pt referred back to pain for tx with methadone for pain control clinic with Dr. Joycelyn Schmid. Pt may also go to [facility] pain clinic (CSW will assist with transportation if pt needs to go there).  -Appreciate nurse Mamie's help getting pain clinic appt for this pt.  Rx tramadol 50mg  q6hr PRN for pain #30 Dr. Joycelyn Schmid office (pain clinic) will contact us or the patient in regards to follow up appointment.

## 2014-01-10 NOTE — Assessment & Plan Note (Signed)
She has chronic abdominal pain with multiple CT abdomen and pelvis negative for etiology for her pain (most recent CT on 08/22/13). She saw Dr. Carlean Purl in 2013 with colonoscopy at that time remarkable for diminutive polyp that was actually not a polyp per path report and repeat colonoscopy recommendation for 2023. She was diagnosed with IBS and recommended to start on Bentyl with f/u but she has never started this medication and has not followed up with GI.  -referred back to GI

## 2014-01-10 NOTE — Assessment & Plan Note (Signed)
Needs mammogram but has many upcoming referrals to specialists coming up, would discuss this during her next visit.  Needs the flu vaccine

## 2014-01-10 NOTE — Assessment & Plan Note (Signed)
She is staying at the [place] [employer], until mid October.  Provided her a letter allowing her to stay indoors intermittently for bed rest as needed.

## 2014-01-10 NOTE — Assessment & Plan Note (Addendum)
Followed at [facility].  Stable.  Refilled Zoloft (of note, she is on depakote for mood stabilization for her Bipolar disorder)

## 2014-01-10 NOTE — Assessment & Plan Note (Signed)
She has sx of GERD but has not taken Protonix.  Start Zantac 150mg  BID.  Referred to GI

## 2014-01-10 NOTE — Assessment & Plan Note (Signed)
Pt has hx of marijuana use for pain control.  ADS referral NOT appropriate at this time.  Pt is actively seeking to be reestablished at pain clinic to start tx with methadone for pain control.

## 2014-01-10 NOTE — Telephone Encounter (Signed)
Message copied by Reggy Eye on Wed Jan 10, 2014  4:05 PM ------      Message from: Carlyle Basques      Created: Wed Jan 10, 2014  2:39 PM       Remember the patient with right flank pain, thinks she has recurrent uti/pyelo.            Her ua and lab work looks normal. No need to treat. Not sure how to get a hold of her since i don't see a phone number? If you find one can you call her and say this is not likely pyelo ------

## 2014-01-10 NOTE — Assessment & Plan Note (Signed)
She has not followed up with Urology for further work up--missed appts due to hospitalizations.  -referred back to Urology

## 2014-01-10 NOTE — Assessment & Plan Note (Signed)
Rx bentyl as indicated per GI, pt was supposed to start this medication but never did.  Referred to GI

## 2014-01-10 NOTE — Progress Notes (Signed)
   Subjective:    Patient ID: Brenda [name], female    DOB: [birth year], 53 y.o.   MRN: [id number]  Diarrhea  Associated symptoms include abdominal pain. Pertinent negatives include no chills, coughing, fever, headaches or vomiting.  Back Pain Associated symptoms include abdominal pain. Pertinent negatives include no dysuria, fever, headaches or weakness.   Brenda [name] is a 53 year old woman with PMH of HTN, chronic back pain, history of polysubstance abuse, Bipolar disorder type I, left renal cyst, who presents for follow up visit for her blood pressure.  She reports that she has persistent right flank pain and right abdominal pain for months now. She also reports recurrent diarrhea off and on for weeks with small amount of blood on the toilet paper.  She is living a the [facility] and requests a letter to allow her to stay indoors and rest during the day.  She needs refills on all her medications.  She is very interested in following up with the pain clinic and to be started on methadone for pain control. She has taken tramadol off and on for the pain but reports that it barely touches her pain. She continues to take Robaxin as needed.   Review of Systems  Constitutional: Negative for fever, chills, diaphoresis, activity change, appetite change and fatigue.  Respiratory: Negative for cough and shortness of breath.   Gastrointestinal: Positive for abdominal pain, diarrhea and blood in stool. Negative for nausea and vomiting.  Genitourinary: Positive for flank pain. Negative for dysuria and frequency.  Musculoskeletal: Positive for back pain.  Skin: Negative for rash.  Neurological: Negative for dizziness, weakness, light-headedness and headaches.  Psychiatric/Behavioral: Negative for agitation.       Objective:   Physical Exam  Nursing note and vitals reviewed. Constitutional: She is oriented to person, place, and time. She appears well-developed and well-nourished. No  distress.  Cardiovascular: Normal rate.   Pulmonary/Chest: Effort normal. No respiratory distress.  Abdominal: Soft. Bowel sounds are normal. She exhibits no distension and no mass. There is tenderness. There is no rebound and no guarding.  Mild TTP of RUQ   Genitourinary:  No CVA tenderness  Musculoskeletal: She exhibits tenderness. She exhibits no edema.  Paraspinal tenderness of lumbar spine  Neurological: She is alert and oriented to person, place, and time. Coordination normal.  Skin: Skin is warm and dry. She is not diaphoretic.  Psychiatric: She has a normal mood and affect.          Assessment & Plan:

## 2014-01-10 NOTE — Assessment & Plan Note (Signed)
BP Readings from Last 3 Encounters:  01/09/14 173/94  01/08/14 191/84  12/27/13 148/65    Lab Results  Component Value Date   NA 140 11/14/2013   K 3.7 11/14/2013   CREATININE 0.58 11/14/2013    Assessment: Blood pressure control: moderately elevated Progress toward BP goal:  deteriorated Comments: She is on norvasc 5mg  daily, lisinopril 5mg  daily, lopressor 25mg  BID, took her medications prior to this visit but stated that she was in pain.   Plan: Medications:  will increased amlodipine to 10mg  daily Educational resources provided:   Self management tools provided:   Other plans: Follow up in 2 weeks.

## 2014-01-11 NOTE — Progress Notes (Signed)
Medicine attending: Medical history, presenting problems, physical findings, and medications, reviewed with resident physician Dr. Hayes Ludwig and I concur with her evaluation and management plan. Murriel Hopper, M.D., [place]

## 2014-01-15 NOTE — Progress Notes (Signed)
Application for [facility]ive Housing Program faxed with Ms. Beckners consent along with letter from [employer].  Faxed to [facility], Supportive Housing Program on 01/15/14.

## 2014-01-17 ENCOUNTER — Telehealth: Payer: Self-pay | Admitting: Licensed Clinical Social Worker

## 2014-01-17 NOTE — Telephone Encounter (Signed)
ROI received. CSW received call from pt's Peer Support Specialist at [facility]. During our conversation, Transport planner, Tiffany, stated pt has been getting medication from [facility], and medication from [facility] [place]. This is pt's report to this peer specialist. Pt did not share with CSW that she was seeing and obtaining prescriptions other providers.

## 2014-01-22 ENCOUNTER — Ambulatory Visit: Payer: Self-pay | Admitting: Internal Medicine

## 2014-01-29 ENCOUNTER — Ambulatory Visit (INDEPENDENT_AMBULATORY_CARE_PROVIDER_SITE_OTHER): Payer: No Typology Code available for payment source | Admitting: Internal Medicine

## 2014-01-29 ENCOUNTER — Encounter: Payer: Self-pay | Admitting: Internal Medicine

## 2014-01-29 VITALS — BP 161/83 | HR 97 | Temp 98.5°F | Ht 63.0 in | Wt 128.3 lb

## 2014-01-29 DIAGNOSIS — J069 Acute upper respiratory infection, unspecified: Secondary | ICD-10-CM | POA: Insufficient documentation

## 2014-01-29 DIAGNOSIS — I10 Essential (primary) hypertension: Secondary | ICD-10-CM

## 2014-01-29 DIAGNOSIS — G8929 Other chronic pain: Secondary | ICD-10-CM

## 2014-01-29 DIAGNOSIS — Z23 Encounter for immunization: Secondary | ICD-10-CM

## 2014-01-29 DIAGNOSIS — M549 Dorsalgia, unspecified: Secondary | ICD-10-CM

## 2014-01-29 DIAGNOSIS — G894 Chronic pain syndrome: Secondary | ICD-10-CM

## 2014-01-29 MED ORDER — TRAMADOL HCL 50 MG PO TABS: 50.0000 mg | ORAL_TABLET | Freq: Four times a day (QID) | ORAL | Status: DC | PRN

## 2014-01-29 MED ORDER — DEXTROMETHORPHAN-GUAIFENESIN 10-100 MG/5ML PO SYRP: 5.0000 mL | ORAL_SOLUTION | Freq: Two times a day (BID) | ORAL | Status: DC | PRN

## 2014-01-29 MED ORDER — LISINOPRIL 5 MG PO TABS: 10.0000 mg | ORAL_TABLET | Freq: Every day | ORAL | Status: DC

## 2014-01-29 NOTE — Assessment & Plan Note (Signed)
Has pain clinic appt for next week.

## 2014-01-29 NOTE — Patient Instructions (Addendum)
General Instructions: Please increase Lisinopril to 10 mg daily  Please be sure to keep appointment with pain clinic on 02/06/2014 Please take Robitussin for your cough   Please bring your medicines with you each time you come to clinic.  Medicines may include prescription medications, over-the-counter medications, herbal remedies, eye drops, vitamins, or other pills.   Progress Toward Treatment Goals:  Treatment Goal 01/29/2014  Blood pressure unchanged  Stop smoking -  Prevent falls -    Self Care Goals & Plans:  Self Care Goal 01/29/2014  Manage my medications take my medicines as prescribed; bring my medications to every visit; refill my medications on time  Monitor my health -  Eat healthy foods drink diet soda or water instead of juice or soda; eat more vegetables; eat foods that are low in salt; eat baked foods instead of fried foods; eat fruit for snacks and desserts  Be physically active -  Stop smoking go to the [employer] (https://[employer].info/)  Prevent falls -  Meeting treatment goals -    No flowsheet data found.   Care Management & Community Referrals:  Referral 10/24/2013  Referrals made for care management support -  Referrals made to community resources none       Upper Respiratory Infection, Adult An upper respiratory infection (URI) is also known as the common cold. It is often caused by a type of germ (virus). Colds are easily spread (contagious). You can pass it to others by kissing, coughing, sneezing, or drinking out of the same glass. Usually, you get better in 1 or 2 weeks.  HOME CARE   Only take medicine as told by your doctor.  Use a warm mist humidifier or breathe in steam from a hot shower.  Drink enough water and fluids to keep your pee (urine) clear or pale yellow.  Get plenty of rest.  Return to work when your temperature is back to normal or as told by your doctor. You may use a face mask and wash your hands to stop your cold  from spreading. GET HELP RIGHT AWAY IF:   After the first few days, you feel you are getting worse.  You have questions about your medicine.  You have chills, shortness of breath, or brown or red spit (mucus).  You have yellow or brown snot (nasal discharge) or pain in the face, especially when you bend forward.  You have a fever, puffy (swollen) neck, pain when you swallow, or white spots in the back of your throat.  You have a bad headache, ear pain, sinus pain, or chest pain.  You have a high-pitched whistling sound when you breathe in and out (wheezing).  You have a lasting cough or cough up blood.  You have sore muscles or a stiff neck. MAKE SURE YOU:   Understand these instructions.  Will watch your condition.  Will get help right away if you are not doing well or get worse. Document Released: 09/16/2007 Document Revised: 06/22/2011 Document Reviewed: 07/05/2013 [facility] Patient Information 2015 [place], [place]. This information is not intended to replace advice given to you by your health care provider. Make sure you discuss any questions you have with your health care provider.

## 2014-01-29 NOTE — Assessment & Plan Note (Signed)
Assessment: Most likely diagnosis: Acute bronchitis, most likely viral.  Ddx: Community acquired pneumonia is less likely without fevers, chest pain, shortness of breath, or physical examination findings.   Plan: 1. Labs/imaging: None 2. Therapy: Robitussin DM Follow up: Instructed the patient to return if she experiences fevers, hemoptysis, chest pain, or increasing shortness of breath.

## 2014-01-29 NOTE — Assessment & Plan Note (Signed)
Appointment with pain clinic has been arranged for next week. Patient requests for refills of her tramadol. I will call in 30 pills. She has been encouraged to attend our pain clinic as we will not provide chronic opiates from our clinic.
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
## 2014-01-29 NOTE — Assessment & Plan Note (Signed)
BP Readings from Last 3 Encounters:  01/29/14 161/83  01/09/14 173/94  01/08/14 191/84    Lab Results  Component Value Date   NA 140 11/14/2013   K 3.7 11/14/2013   CREATININE 0.58 11/14/2013    Assessment: Blood pressure control: mildly elevated Progress toward BP goal:  unchanged Comments: On lisinopril 5 mg daily, and amlodipine 10 mg daily.  Plan: Medications:  Increase lisinopril to 10 mg daily. Educational resources provided:   Self management tools provided:   Other plans: Followup for BP check and BMP for renal function, and potassium level.

## 2014-01-30 NOTE — Progress Notes (Signed)
Case discussed with Dr. Kazibwe soon after the resident saw the patient.  We reviewed the resident's history and exam and pertinent patient test results.  I agree with the assessment, diagnosis, and plan of care documented in the resident's note. 

## 2014-02-06 ENCOUNTER — Encounter: Payer: No Typology Code available for payment source | Attending: Physical Medicine & Rehabilitation

## 2014-02-06 ENCOUNTER — Encounter: Payer: Self-pay | Admitting: Physical Medicine & Rehabilitation

## 2014-02-06 ENCOUNTER — Ambulatory Visit (HOSPITAL_BASED_OUTPATIENT_CLINIC_OR_DEPARTMENT_OTHER): Payer: No Typology Code available for payment source | Admitting: Physical Medicine & Rehabilitation

## 2014-02-06 VITALS — BP 165/94 | HR 94 | Resp 14 | Ht 63.0 in | Wt 127.0 lb

## 2014-02-06 DIAGNOSIS — G894 Chronic pain syndrome: Secondary | ICD-10-CM

## 2014-02-06 MED ORDER — DICLOFENAC SODIUM 1 % TD GEL: 1.0000 "application " | Freq: Four times a day (QID) | TRANSDERMAL | Status: DC

## 2014-02-06 MED ORDER — GABAPENTIN 100 MG PO CAPS: 100.0000 mg | ORAL_CAPSULE | Freq: Three times a day (TID) | ORAL | Status: DC

## 2014-02-06 MED ORDER — TRAMADOL HCL 50 MG PO TABS: 50.0000 mg | ORAL_TABLET | Freq: Three times a day (TID) | ORAL | Status: DC
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
## 2014-02-07 ENCOUNTER — Other Ambulatory Visit: Payer: Self-pay | Admitting: Internal Medicine

## 2014-02-07 DIAGNOSIS — M4802 Spinal stenosis, cervical region: Secondary | ICD-10-CM | POA: Insufficient documentation

## 2014-02-07 NOTE — Progress Notes (Signed)
Per note from visit with Dr. Letta Pate the pt is in need of referral to Neurosurgery at [facility]. She was seen by Neurosurgeon, Dr. Saintclair Halsted in 11/15/13 who recommended outpatient cervical epidural at C6-76for her cervical spinal stenosis and outpatient pain management.   Referred pt to Neurosurgery.    Blain Pais, MD PGY3, IMTS 02/07/2014, 8:09 AM

## 2014-02-12 ENCOUNTER — Encounter: Payer: Self-pay | Admitting: Physical Medicine & Rehabilitation

## 2014-02-14 ENCOUNTER — Encounter: Payer: Self-pay | Admitting: Internal Medicine

## 2014-02-14 ENCOUNTER — Telehealth: Payer: Self-pay | Admitting: *Deleted

## 2014-02-14 ENCOUNTER — Encounter: Payer: Self-pay | Admitting: Licensed Clinical Social Worker

## 2014-02-14 NOTE — Progress Notes (Signed)
Ms. Eckersley presents to clinic today requesting letter to provide to shelter to allow her to remain in the shelter during the day and rest due to medical issues.   Attending physician provided Ms. Millette with letter, however, pt complained that wording was not the same as previous letter and the time limit on letter today.  CSW explained to Ms. Eilers, unable to change wording of letter and pt can return in two weeks to be evaluated and request another letter from PCP.  Pt has [employer] card transportation, did not use for today.  CSW provided Ms. Bechard with one [facility] bus pass to return home.  Pt inquired about [place] letter, CSW inquired if pt wanted to discuss in a private room.  Pt declined.  CSW informed Ms. Mauger application had been sent to [place].  Support[facility] Housing would be in contact with her regarding status.  Pt proceeded to inform CSW of physician recommendations from pain speciality and tx received.  CSW encouraged Ms. Gartley to contact CSW once appointment at [facility] had been made, as CSW will schedule transportation directly.  Pt aware, wrote down CSW contact information.

## 2014-02-14 NOTE — Telephone Encounter (Signed)
Letter provided

## 2014-02-14 NOTE — Telephone Encounter (Signed)
Pt walks in and requests an updated letter so she may be given a place to rest during the day since she is homeless. Could you provide her with this?

## 2014-02-21 ENCOUNTER — Emergency Department (HOSPITAL_COMMUNITY)
Admission: EM | Admit: 2014-02-21 | Discharge: 2014-02-21 | Disposition: A | Discharge status: Home / Self Care | Payer: No Typology Code available for payment source | Attending: Emergency Medicine | Admitting: Emergency Medicine

## 2014-02-21 ENCOUNTER — Encounter (HOSPITAL_COMMUNITY): Payer: Self-pay | Admitting: *Deleted

## 2014-02-21 DIAGNOSIS — Z720 Tobacco use: Secondary | ICD-10-CM | POA: Insufficient documentation

## 2014-02-21 DIAGNOSIS — Z91410 Personal history of adult physical and sexual abuse: Secondary | ICD-10-CM | POA: Insufficient documentation

## 2014-02-21 DIAGNOSIS — Z79899 Other long term (current) drug therapy: Secondary | ICD-10-CM | POA: Insufficient documentation

## 2014-02-21 DIAGNOSIS — R109 Unspecified abdominal pain: Secondary | ICD-10-CM | POA: Insufficient documentation

## 2014-02-21 DIAGNOSIS — E785 Hyperlipidemia, unspecified: Secondary | ICD-10-CM | POA: Insufficient documentation

## 2014-02-21 DIAGNOSIS — R05 Cough: Secondary | ICD-10-CM | POA: Insufficient documentation

## 2014-02-21 DIAGNOSIS — K589 Irritable bowel syndrome without diarrhea: Secondary | ICD-10-CM | POA: Insufficient documentation

## 2014-02-21 DIAGNOSIS — Z862 Personal history of diseases of the blood and blood-forming organs and certain disorders involving the immune mechanism: Secondary | ICD-10-CM | POA: Insufficient documentation

## 2014-02-21 DIAGNOSIS — Z7982 Long term (current) use of aspirin: Secondary | ICD-10-CM | POA: Insufficient documentation

## 2014-02-21 DIAGNOSIS — M1990 Unspecified osteoarthritis, unspecified site: Secondary | ICD-10-CM | POA: Insufficient documentation

## 2014-02-21 DIAGNOSIS — M549 Dorsalgia, unspecified: Secondary | ICD-10-CM

## 2014-02-21 DIAGNOSIS — F419 Anxiety disorder, unspecified: Secondary | ICD-10-CM | POA: Insufficient documentation

## 2014-02-21 DIAGNOSIS — I509 Heart failure, unspecified: Secondary | ICD-10-CM | POA: Insufficient documentation

## 2014-02-21 DIAGNOSIS — Z9889 Other specified postprocedural states: Secondary | ICD-10-CM | POA: Insufficient documentation

## 2014-02-21 DIAGNOSIS — I2510 Atherosclerotic heart disease of native coronary artery without angina pectoris: Secondary | ICD-10-CM | POA: Insufficient documentation

## 2014-02-21 DIAGNOSIS — I10 Essential (primary) hypertension: Secondary | ICD-10-CM | POA: Insufficient documentation

## 2014-02-21 DIAGNOSIS — Z8673 Personal history of transient ischemic attack (TIA), and cerebral infarction without residual deficits: Secondary | ICD-10-CM | POA: Insufficient documentation

## 2014-02-21 DIAGNOSIS — G8929 Other chronic pain: Secondary | ICD-10-CM | POA: Insufficient documentation

## 2014-02-21 DIAGNOSIS — Z8701 Personal history of pneumonia (recurrent): Secondary | ICD-10-CM | POA: Insufficient documentation

## 2014-02-21 DIAGNOSIS — F319 Bipolar disorder, unspecified: Secondary | ICD-10-CM | POA: Insufficient documentation

## 2014-02-21 DIAGNOSIS — N390 Urinary tract infection, site not specified: Secondary | ICD-10-CM

## 2014-02-21 DIAGNOSIS — I252 Old myocardial infarction: Secondary | ICD-10-CM | POA: Insufficient documentation

## 2014-02-21 DIAGNOSIS — M545 Low back pain: Secondary | ICD-10-CM | POA: Insufficient documentation

## 2014-02-21 LAB — URINE MICROSCOPIC-ADD ON

## 2014-02-21 LAB — URINALYSIS, ROUTINE W REFLEX MICROSCOPIC
BILIRUBIN URINE: NEGATIVE
Glucose, UA: NEGATIVE mg/dL
Ketones, ur: 15 mg/dL — AB
Nitrite: NEGATIVE
PH: 6 (ref 5.0–8.0)
Protein, ur: NEGATIVE mg/dL
Specific Gravity, Urine: 1.028 (ref 1.005–1.030)
Urobilinogen, UA: 1 mg/dL (ref 0.0–1.0)

## 2014-02-21 MED ORDER — KETOROLAC TROMETHAMINE 60 MG/2ML IM SOLN
60.0000 mg | Freq: Once | INTRAMUSCULAR | Status: AC
  Administered 2014-02-21: 60 mg via INTRAMUSCULAR
  Filled 2014-02-21: qty 2

## 2014-02-21 MED ORDER — CEPHALEXIN 500 MG PO CAPS: 500.0000 mg | ORAL_CAPSULE | Freq: Four times a day (QID) | ORAL | Status: DC

## 2014-02-21 MED ORDER — OXYCODONE-ACETAMINOPHEN 5-325 MG PO TABS
2.0000 | ORAL_TABLET | Freq: Once | ORAL | Status: AC
  Administered 2014-02-21: 2 via ORAL
  Filled 2014-02-21: qty 2
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
## 2014-02-21 NOTE — ED Provider Notes (Signed)
53 year old female complains of pain all over, especially in her "kidney". She complains of some urinary frequency. On exam the patient hurts no matter where you touch her, this involves both her legs, arms, back, abdomen, neck, feet. She has normal heart and lung sounds, her sensorium and neurologic exam is normal. Vital signs are unremarkable, check urinalysis, anti-inflammatories, anticipate discharge. She states that she is planning to follow up with a spinal surgeon in [facility] as shereportedly has been told bythe neurosurgeon here that she was not a surgical candidate for her chronic pain.  The patient was given medications for her pain As below, she states that she was upset with the amount of pain control and was going to go use heroin to control her pain. The patient exhibited drug-seeking behavior, appear stable for discharge  Meds given in ED:  Medications  ketorolac (TORADOL) injection 60 mg (60 mg Intramuscular Given 02/21/14 1849)  oxyCODONE-acetaminophen (PERCOCET/ROXICET) 5-325 MG per tablet 2 tablet (2 tablets Oral Given 02/21/14 1917)    Discharge Medication List as of 02/21/2014  7:43 PM    START taking these medications   Details  cephALEXin (KEFLEX) 500 MG capsule Take 1 capsule (500 mg total) by mouth 4 (four) times daily., Starting 02/21/2014, Until Discontinued, Print          Medical screening examination/treatment/procedure(s) were conducted as a shared visit with non-physician practitioner(s) and myself.  I personally evaluated the patient during the encounter.  Clinical Impression:   Final diagnoses:  Chronic back pain  UTI (lower urinary tract infection)         Johnna Acosta, MD 02/21/14 2340

## 2014-02-21 NOTE — Discharge Instructions (Signed)
Antibiotic Medication Antibiotic medicine helps fight germs. Germs cause infections. This type of medicine will not work for colds, flu, or other viral infections. Tell your doctor if you:  Are allergic to any medicines.  Are pregnant or are trying to get pregnant.  Are taking other medicines.  Have other medical problems. HOME CARE  Take your medicine with a glass of water or food as told by your doctor.  Take the medicine as told. Finish them even if you start to feel better.  Do not give your medicine to other people.  Do not use your medicine in the future for a different infection.  Ask your doctor about which side effects to watch for.  Try not to miss any doses. If you miss a dose, take it as soon as possible. If it is almost time for your next dose, and your dosing schedule is:  Two doses a day, take the missed dose and the next dose 5 to 6 hours later.  Three or more doses a day, take the missed dose and the next dose 2 to 4 hours later, or double your next dose.  Then go back to your normal schedule. GET HELP RIGHT AWAY IF:   You get worse or do not get better within a few days.  The medicine makes you sick.  You develop a rash or any other side effects.  You have questions or concerns. MAKE SURE YOU:  Understand these instructions.  Will watch your condition.  Will get help right away if you are not doing well or get worse. Document Released: 01/07/2008 Document Revised: 06/22/2011 Document Reviewed: 03/05/2009 [facility] Patient Information 2015 [place], [place]. This information is not intended to replace advice given to you by your health care provider. Make sure you discuss any questions you have with your health care provider.  Back Exercises Back exercises help treat and prevent back injuries. The goal of back exercises is to increase the strength of your abdominal and back muscles and the flexibility of your back. These exercises should be started when  you no longer have back pain. Back exercises include:  Pelvic Tilt. Lie on your back with your knees bent. Tilt your pelvis until the lower part of your back is against the floor. Hold this position 5 to 10 sec and repeat 5 to 10 times.  Knee to Chest. Pull first 1 knee up against your chest and hold for 20 to 30 seconds, repeat this with the other knee, and then both knees. This may be done with the other leg straight or bent, whichever feels better.  Sit-Ups or Curl-Ups. Bend your knees 90 degrees. Start with tilting your pelvis, and do a partial, slow sit-up, lifting your trunk only 30 to 45 degrees off the floor. Take at least 2 to 3 seconds for each sit-up. Do not do sit-ups with your knees out straight. If partial sit-ups are difficult, simply do the above but with only tightening your abdominal muscles and holding it as directed.  Hip-Lift. Lie on your back with your knees flexed 90 degrees. Push down with your feet and shoulders as you raise your hips a couple inches off the floor; hold for 10 seconds, repeat 5 to 10 times.  Back arches. Lie on your stomach, propping yourself up on bent elbows. Slowly press on your hands, causing an arch in your low back. Repeat 3 to 5 times. Any initial stiffness and discomfort should lessen with repetition over time.  Shoulder-Lifts. Lie face  down with arms beside your body. Keep hips and torso pressed to floor as you slowly lift your head and shoulders off the floor. Do not overdo your exercises, especially in the beginning. Exercises may cause you some mild back discomfort which lasts for a few minutes; however, if the pain is more severe, or lasts for more than 15 minutes, do not continue exercises until you see your caregiver. Improvement with exercise therapy for back problems is slow.  See your caregivers for assistance with developing a proper back exercise program. Document Released: 05/07/2004 Document Revised: 06/22/2011 Document Reviewed:  01/29/2011 [facility] Patient Information 2015 [place], [place]. This information is not intended to replace advice given to you by your health care provider. Make sure you discuss any questions you have with your health care provider. Urinary Tract Infection Urinary tract infections (UTIs) can develop anywhere along your urinary tract. Your urinary tract is your body's drainage system for removing wastes and extra water. Your urinary tract includes two kidneys, two ureters, a bladder, and a urethra. Your kidneys are a pair of bean-shaped organs. Each kidney is about the size of your fist. They are located below your ribs, one on each side of your spine. CAUSES Infections are caused by microbes, which are microscopic organisms, including fungi, viruses, and bacteria. These organisms are so small that they can only be seen through a microscope. Bacteria are the microbes that most commonly cause UTIs. SYMPTOMS  Symptoms of UTIs may vary by age and gender of the patient and by the location of the infection. Symptoms in young women typically include a frequent and intense urge to urinate and a painful, burning feeling in the bladder or urethra during urination. Older women and men are more likely to be tired, shaky, and weak and have muscle aches and abdominal pain. A fever may mean the infection is in your kidneys. Other symptoms of a kidney infection include pain in your back or sides below the ribs, nausea, and vomiting. DIAGNOSIS To diagnose a UTI, your caregiver will ask you about your symptoms. Your caregiver also will ask to provide a urine sample. The urine sample will be tested for bacteria and white blood cells. White blood cells are made by your body to help fight infection. TREATMENT  Typically, UTIs can be treated with medication. Because most UTIs are caused by a bacterial infection, they usually can be treated with the use of antibiotics. The choice of antibiotic and length of treatment depend on  your symptoms and the type of bacteria causing your infection. HOME CARE INSTRUCTIONS  If you were prescribed antibiotics, take them exactly as your caregiver instructs you. Finish the medication even if you feel better after you have only taken some of the medication.  Drink enough water and fluids to keep your urine clear or pale yellow.  Avoid caffeine, tea, and carbonated beverages. They tend to irritate your bladder.  Empty your bladder often. Avoid holding urine for long periods of time.  Empty your bladder before and after sexual intercourse.  After a bowel movement, women should cleanse from front to back. Use each tissue only once. SEEK MEDICAL CARE IF:   You have back pain.  You develop a fever.  Your symptoms do not begin to resolve within 3 days. SEEK IMMEDIATE MEDICAL CARE IF:   You have severe back pain or lower abdominal pain.  You develop chills.  You have nausea or vomiting.  You have continued burning or discomfort with urination. MAKE SURE  YOU:   Understand these instructions.  Will watch your condition.  Will get help right away if you are not doing well or get worse. Document Released: 01/07/2005 Document Revised: 09/29/2011 Document Reviewed: 05/08/2011 [facility] Patient Information 2015 [place], [place]. This information is not intended to replace advice given to you by your health care provider. Make sure you discuss any questions you have with your health care provider.

## 2014-02-21 NOTE — ED Notes (Signed)
The pt arrived by [facility].  C/o neck and shoulder pain chronic back pain.  She cannot sleep  For the pain.  She thinks she has a kidney infection rt foot painful.   Blood in ujrine a few times chronic antibiotic resistant eccoli   She cannot walk without getting breathless.  Fingers swollen and she cannot get her rings off.  lmp none

## 2014-02-21 NOTE — ED Notes (Addendum)
Pt discharge and was stating loudly "I guess I'll do some heroin, I will find some heroin to help me with my pain." She also stated "so what if I do pot, it's legal in 7 states, "I'm going to [facility]."

## 2014-02-23 LAB — URINE CULTURE
Colony Count: NO GROWTH
Culture: NO GROWTH

## 2014-02-26 ENCOUNTER — Telehealth: Payer: Self-pay | Admitting: *Deleted

## 2014-02-26 ENCOUNTER — Encounter: Payer: Self-pay | Admitting: Internal Medicine

## 2014-02-26 NOTE — Telephone Encounter (Signed)
Letter printed, signed, left with Bonnita Nasuti, our triage nurse.   Thanks!  SK

## 2014-02-26 NOTE — Telephone Encounter (Signed)
WALK IN, pt is here and wants a new letter for her shelter, could you please do this now, thanks

## 2014-02-27 ENCOUNTER — Telehealth: Payer: Self-pay | Admitting: Licensed Clinical Social Worker

## 2014-02-27 ENCOUNTER — Encounter: Payer: Self-pay | Admitting: Internal Medicine

## 2014-02-27 ENCOUNTER — Encounter: Payer: Self-pay | Admitting: Licensed Clinical Social Worker

## 2014-02-27 ENCOUNTER — Ambulatory Visit (INDEPENDENT_AMBULATORY_CARE_PROVIDER_SITE_OTHER): Payer: Self-pay | Admitting: Internal Medicine

## 2014-02-27 VITALS — BP 181/78 | HR 77 | Temp 98.8°F | Ht 63.0 in | Wt 122.2 lb

## 2014-02-27 DIAGNOSIS — G894 Chronic pain syndrome: Secondary | ICD-10-CM

## 2014-02-27 DIAGNOSIS — R109 Unspecified abdominal pain: Secondary | ICD-10-CM

## 2014-02-27 DIAGNOSIS — I10 Essential (primary) hypertension: Secondary | ICD-10-CM

## 2014-02-27 LAB — POCT URINALYSIS DIPSTICK
BILIRUBIN UA: NEGATIVE
GLUCOSE UA: NEGATIVE
KETONES UA: NEGATIVE
Leukocytes, UA: NEGATIVE
Nitrite, UA: NEGATIVE
SPEC GRAV UA: 1.015
UROBILINOGEN UA: 0.2
pH, UA: 6.5

## 2014-02-27 NOTE — Assessment & Plan Note (Signed)
BP Readings from Last 3 Encounters:  02/27/14 181/78  02/21/14 137/91  02/06/14 165/94    Lab Results  Component Value Date   NA 140 11/14/2013   K 3.7 11/14/2013   CREATININE 0.58 11/14/2013    Assessment: Blood pressure control: moderately elevated Progress toward BP goal:  deteriorated Comments: patient states that she has continued with her home regimen of medications, without missing any of them. Patient reports that her current acute pain has been making her blood pressure become extremely elevated, and she needs something acutely to control her pain.  Plan: Medications:  continue current medications; Patient is quite worked up regarding her acute pain. Will defer to PCP at next follow-up regarding management.

## 2014-02-27 NOTE — Progress Notes (Signed)
Ms. Brenda [name] requesting to meet with CSW.  Pt requesting return bus pass home as she had an urgent appointment at [facility] today.  Ms. Brenda [name] generally uses the [facility] transportation, but needed to be seen urgently.  Pt also requesting assistance with scheduling [facility] for her Urology appointment on 03/01/14 since it is less than 3 days.  CSW sent request to [facility] and will notify Ms. Brenda [name] if [facility] is able to provide.

## 2014-02-27 NOTE — Progress Notes (Signed)
   Subjective:    Patient ID: Brenda [name], female    DOB: Aug 26, 1960, 53 y.o.   MRN: [id number]  HPI  Brenda [name] is a 53 year old female with history of depression, hypertension, chronic back pain, hyperlipidemia, coronary artery disease, polysubstance abuse, TIA who presents with a chief complaints of back pain, unresolved urinary tract infection, and hypertension.   Please refer to separate problem-list charting for more details.  Review of Systems  Constitutional: Negative for fever and chills.  HENT: Negative for rhinorrhea and sore throat.   Eyes: Negative for visual disturbance.  Respiratory: Positive for cough. Negative for shortness of breath.   Cardiovascular: Negative for chest pain and palpitations.  Gastrointestinal: Negative for nausea, vomiting, abdominal pain, diarrhea, constipation and blood in stool.  Genitourinary: Positive for flank pain. Negative for dysuria and hematuria.  Neurological: Negative for syncope.       Objective:   Physical Exam  Constitutional: She is oriented to person, place, and time. She appears well-developed and well-nourished. No distress.  HENT:  Head: Normocephalic and atraumatic.  Eyes: EOM are normal. Pupils are equal, round, and reactive to light. Left eye exhibits no discharge.  Neck: Normal range of motion. Neck supple. No thyromegaly present.  Cardiovascular: Normal rate and regular rhythm.  Exam reveals no gallop and no friction rub.   No murmur heard. Pulmonary/Chest: Effort normal and breath sounds normal. No respiratory distress. She has no wheezes. She has no rales.  Abdominal: Soft. Bowel sounds are normal. She exhibits no distension. There is tenderness ( mild tenderness along the right flank). There is no rebound.  Musculoskeletal: She exhibits no edema.  Neurological: She is alert and oriented to person, place, and time. No cranial nerve deficit.  Skin: Skin is warm and dry. No rash noted.  Psychiatric: She has a normal  mood and affect. Thought content normal.       Assessment & Plan:  Please refer to separate problem-list charting for more details.

## 2014-02-27 NOTE — Progress Notes (Signed)
Internal Medicine Clinic Attending  I saw and evaluated the patient.  I personally confirmed the key portions of the history and exam documented by Dr. Raelene Bott and I reviewed pertinent patient test results.  The assessment, diagnosis, and plan were formulated together and I agree with the documentation in the resident's note. I did review the inappropriate UDS's that lead to the FYI and decision that [facility] no longer Rx chronic controlled substances and the UDS's were inappropriate and the FYI is appropriate and [facility] should not Rx chronic controlled substances. As for her acute pain, there is no obvious etiology and Dr Raelene Bott has ordered additional testing. We cannot Rx opioids for acute pain without a plausible etiology.

## 2014-02-27 NOTE — Telephone Encounter (Signed)
CSW placed call to Ms. Archuleta.  Pt notified [facility] is able to provide urgent transportation to her Urology appointment on 03/01/14, pt aware of pick up time 8:25-9:05.  In addition, CSW discussed pt's referral to [facility] per nursing staff.  Pt notified nursing contacted [facility] regarding pt's referral and charity care application.  Pt will need to incur a bill from [facility] prior to applying for their [facility].  Referral requires $100 upfront at visit.  CSW inquired with Ms. Englander if she would be able to obtain the $100 for the office visit.  Pt states she may be able to come up with the funds around the end of December and request nursing to continue with the referral.  CSW notified nursing.   Pt also complained regarding the effectiveness of her Tramadol and that she has lost 10lbs due to pain, attempting to avoid a hospitalization or an ED visit.  Ms. Don is currently managed by a local pain clinic.
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
## 2014-02-27 NOTE — Patient Instructions (Signed)
We have run some additional tests on your urine to see if you have any sources of infection that can be causing your acute pain at this point. Please follow-up with your other specialists for management of your pain.

## 2014-02-28 LAB — URINE CULTURE
COLONY COUNT: NO GROWTH
ORGANISM ID, BACTERIA: NO GROWTH

## 2014-02-28 LAB — URINE CYTOLOGY ANCILLARY ONLY
Chlamydia: NEGATIVE
NEISSERIA GONORRHEA: NEGATIVE

## 2014-02-28 NOTE — Telephone Encounter (Signed)
Hi Shana,    I am not sure if you will call her in the near future but please encourage her to continue following up with the pain clinic for further pain control. I appreciate your help arranging transportation for her.   Thanks!  SK

## 2014-03-01 ENCOUNTER — Telehealth: Payer: Self-pay | Admitting: Licensed Clinical Social Worker

## 2014-03-01 NOTE — Telephone Encounter (Signed)
Thank you and I encouraged during contacts with her.

## 2014-03-01 NOTE — Telephone Encounter (Signed)
CSW received call from [place] a new application, complete with letter from [place].  CSW placed call to Ms. Shults, pt notified and requested to obtain letter from [facility].  Pt states she will get letter and request it to be faxed to [place].

## 2014-03-06 ENCOUNTER — Telehealth: Payer: Self-pay | Admitting: *Deleted

## 2014-03-06 NOTE — Telephone Encounter (Signed)
[facility] calls and states pt's zantac needs to be OTC, pt can buy it at [facility] store for $1.00, this is due to the fact of pt being homeless and there is no reimbursement for the health dept

## 2014-03-06 NOTE — Telephone Encounter (Signed)
Hi Brenda [name],    As long as the patient is in agreement, she could take it OTC. Zantac 150mg  daily   Thanks! SK

## 2014-03-09 IMAGING — MR MR LUMBAR SPINE WO/W CM
4 of 7 series · 18 of 48 positions shown · IV contrast (multihance)
Comparison: 04/03/2010 MR.  05/11/2011 and 02/20/2008 abdomen
pelvis CT.

CLINICAL DATA: Low back pain extending down right leg.  Right L4
numbness.

MRI LUMBAR SPINE WITHOUT AND WITH CONTRAST
TECHNIQUE: Multiplanar and multiecho pulse sequences of the lumbar
spine were obtained without and with intravenous contrast.
Contrast: 12mL MULTIHANCE GADOBENATE DIMEGLUMINE 529 MG/ML IV SOLN

[Series 3: T2 · sagittal · 4.0mm · 0.55mm/px · 5 of 12 slices shown (1 of 2)]
[im 1/12]
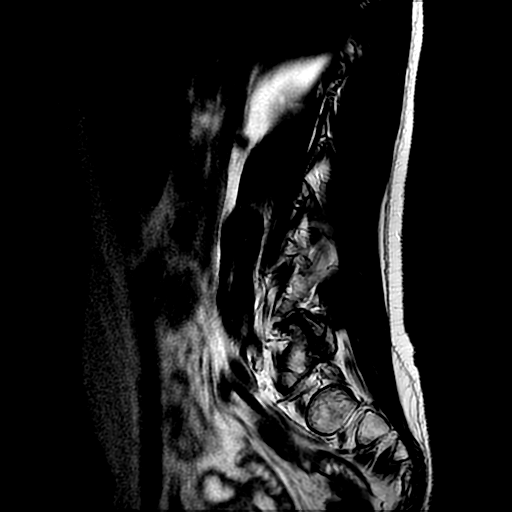
[im 3/12]
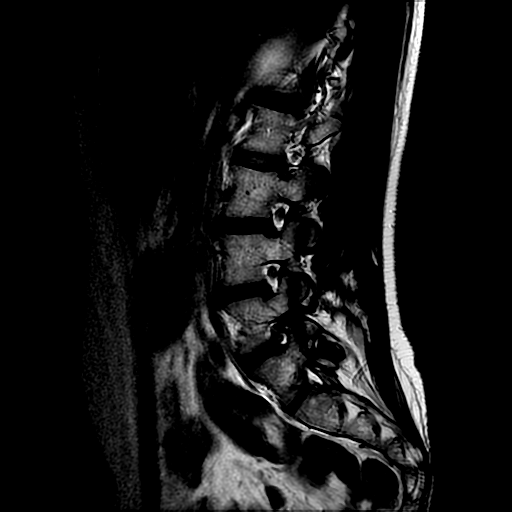
[im 6/12]
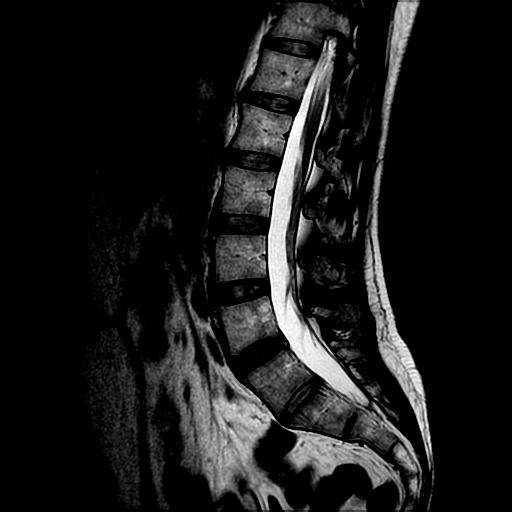
[im 9/12]
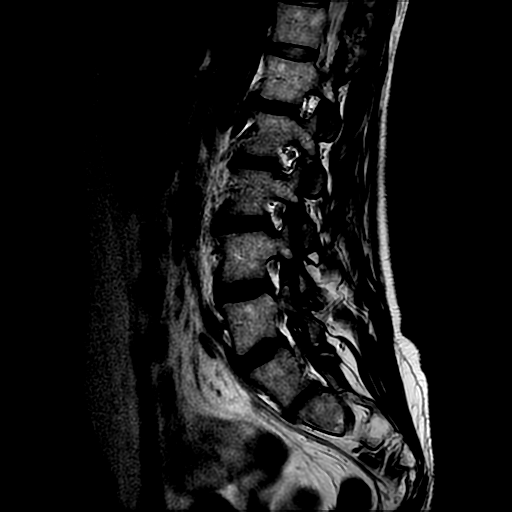
[im 12/12]
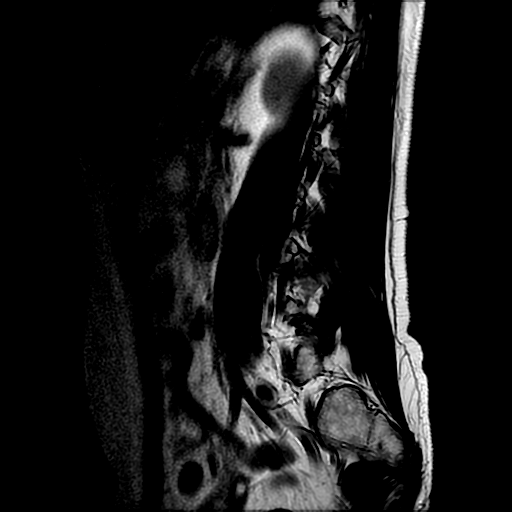

[Series 4: T1 · sagittal · 4.0mm · 0.55mm/px · 3 of 12 slices shown (1 of 2)]
[im 1/12]
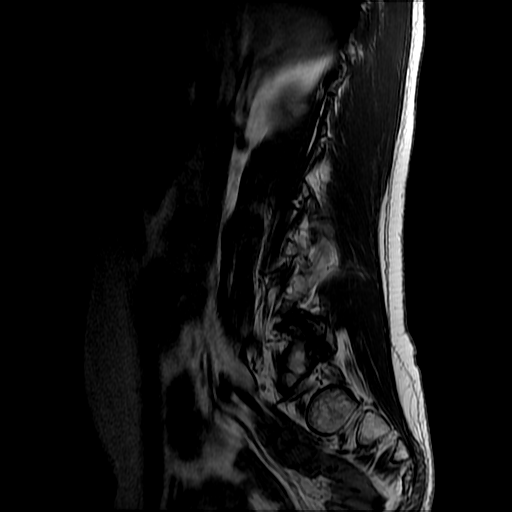
[im 6/12]
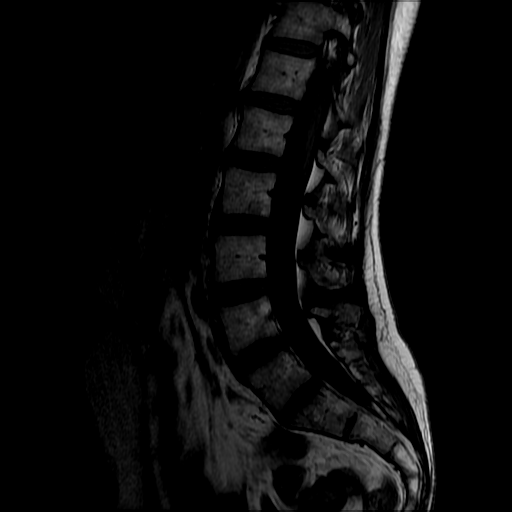
[im 12/12]
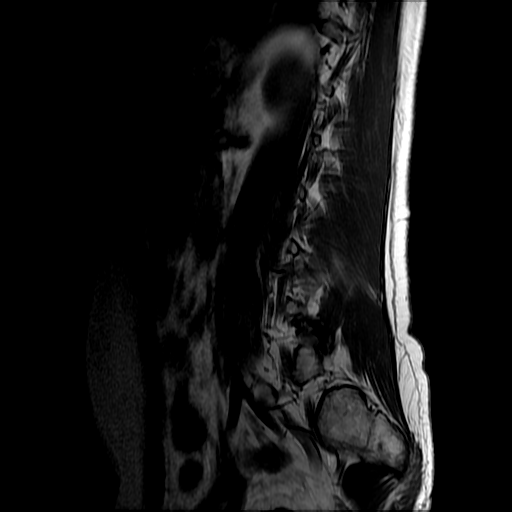

[Series 6: T2 · axial · 4.0mm · 0.39mm/px · z∈[-124,+33]mm · 7 of 30 slices shown (2 of 2)]
[im 1/30]
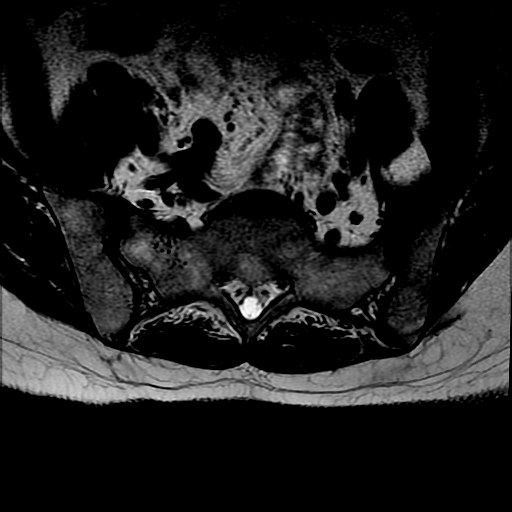
[im 4/30]
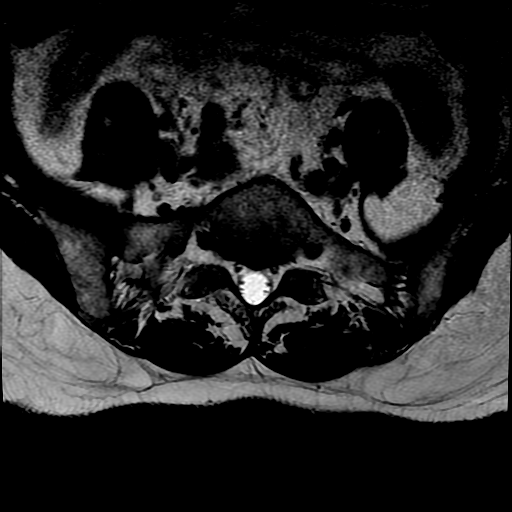
[im 10/30]
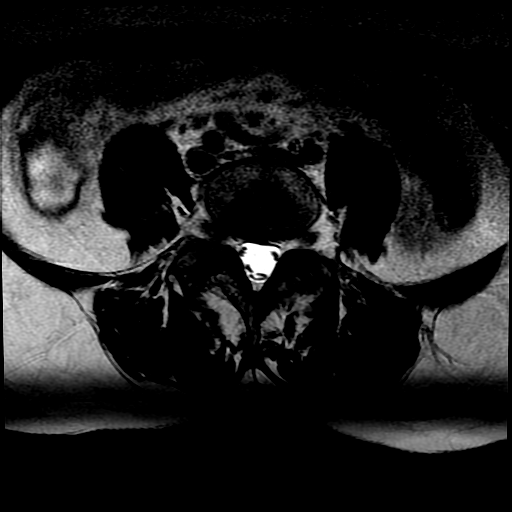
[im 13/30]
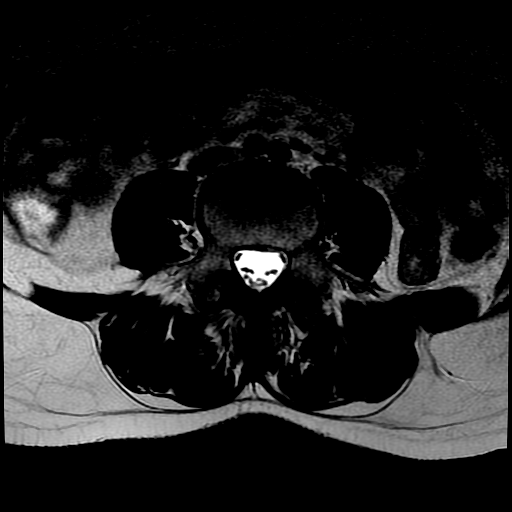
[im 17/30]
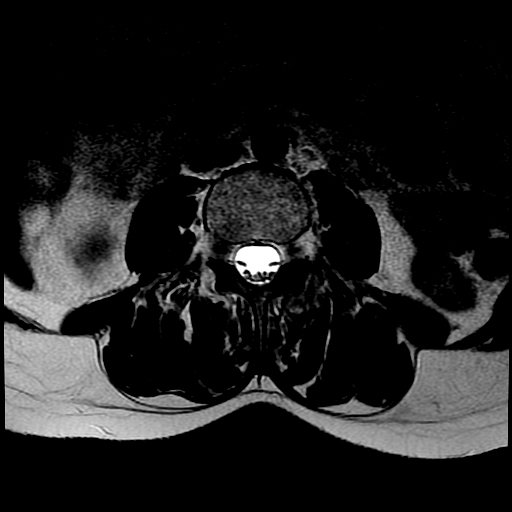
[im 20/30]
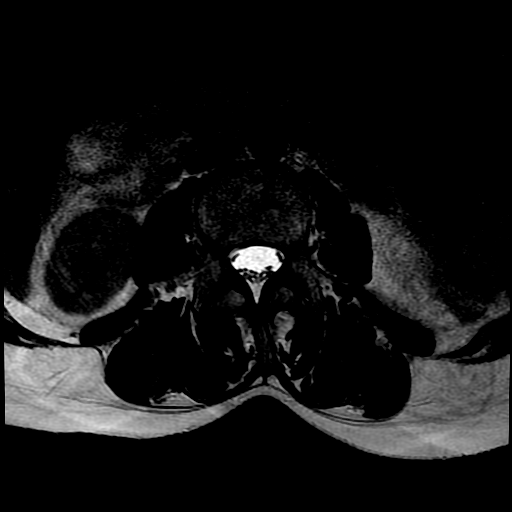
[im 26/30]
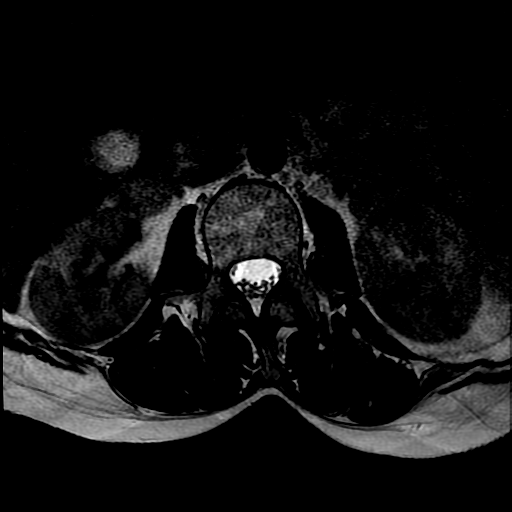

[Series 7: T1 · axial · 4.0mm · 0.39mm/px · z∈[-110,+33]mm · 3 of 30 slices shown (2 of 2)]
[im 4/30]
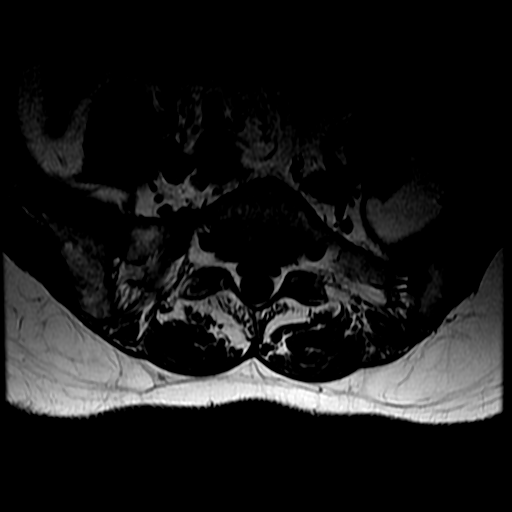
[im 17/30]
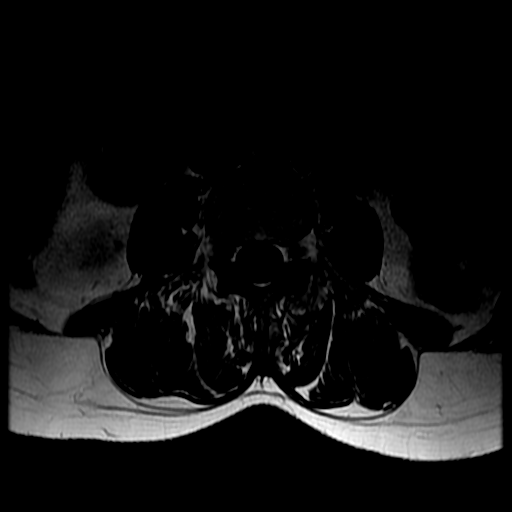
[im 26/30]
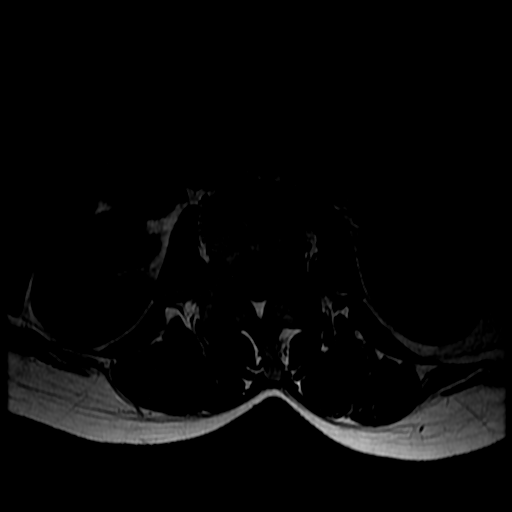

[18 of 48 positions shown; findings below may reference images not displayed]

FINDINGS: Slightly transitional appearance of what was previously
been labeled the L5 vertebral body.  Utilizing this level
assignment, the present examination incorporates from T11 through
the lower sacrum.

Conus L1-2 disc space level.

Tiny right renal lesion may be a cyst.

Mild dextroscoliosis.

T11-12 and T12-L1 unremarkable.

L1-2:  Minimal to mild facet joint degenerative changes greater on
the right.

L2-3:  Mild facet joint degenerative changes.

L3-4:  Mild to moderate facet joint degenerative changes.

L4-5:  Prominent facet joint degenerative changes greater on the
right. Right facet joint bony overgrowth encroaches upon the
exiting L4 nerve root in the superior aspect of the neural foramen.
The right L4 nerve root at this level demonstrates enhancement
along its posterior margin which may represent inflammation/scar
tissue as a discrete disc herniation in the neural foramen is not
detected. Disc is degenerated with bulge/shallow protrusion with
left paracentral annular tear.  No significant spinal stenosis.

L5-S1:  Rudimentary appearance of the disc with bony overgrowth of
the right L5-S1 articulation. Very mild bilateral foraminal
narrowing.

Sacroiliac joint degenerative changes greater on the right.

Asymmetric fat adjacent to the psoas musculature unchanged from
IMPRESSION: L3-4 mild to moderate facet joint degenerative changes.

L4-5 right facet joint bony overgrowth encroaches upon the exiting
L4 nerve root in the superior aspect of the neural foramen.  The
right L4 nerve root at this level demonstrates enhancement along
its posterior margin which may represent inflammation/scar tissue
as a discrete disc herniation in the neural foramen is not
detected.

L4-5 bulge/shallow protrusion with left paracentral annular tear.
No significant spinal stenosis.

L5-S1 rudimentary appearance of the disc with bony overgrowth of
the right L5-S1 articulation. Very mild bilateral foraminal
narrowing.

Sacroiliac joint degenerative changes greater on the right.

## 2014-03-13 ENCOUNTER — Encounter: Payer: Self-pay | Admitting: Internal Medicine

## 2014-03-13 ENCOUNTER — Ambulatory Visit (INDEPENDENT_AMBULATORY_CARE_PROVIDER_SITE_OTHER): Payer: Self-pay | Admitting: Internal Medicine

## 2014-03-13 VITALS — BP 144/78 | HR 80 | Ht 63.0 in | Wt 123.0 lb

## 2014-03-13 DIAGNOSIS — K589 Irritable bowel syndrome without diarrhea: Secondary | ICD-10-CM

## 2014-03-13 NOTE — Patient Instructions (Addendum)
  Keep using the Bentyl (dicyclomine) as you are and see me as needed.  You have been given a separate informational sheet regarding your tobacco use, the importance of quitting and local resources to help you quit.  I appreciate the opportunity to care for you. Gatha Mayer, MD, [facility]

## 2014-03-13 NOTE — Progress Notes (Signed)
   Subjective:    Patient ID: Brenda [name], female    DOB: April 10, 1961, 53 y.o.   MRN: [id number]  HPI Pleasant woman with multiple chronic medical problems and chronic pain syndrome with IBS dx after colonoscopy 2013. Has been started on dicyclomine prn and that works well to control bouts of diarrhea. Medications, allergies, past medical history, past surgical history, family history and social history are reviewed and updated in the EMR.  Review of Systems Frustrated over chronic pain issues and difficulties obtaining narcotic medication on long-term basis    Objective:   Physical Exam Thin WDWN NAD    Assessment & Plan:  Irritable bowel syndrome Doing well on dicyclomine prn Continue this and see GI prn

## 2014-03-13 NOTE — Assessment & Plan Note (Signed)
Doing well on dicyclomine prn Continue this and see GI prn

## 2014-03-19 ENCOUNTER — Encounter: Payer: Self-pay | Admitting: Internal Medicine

## 2014-03-19 ENCOUNTER — Ambulatory Visit (INDEPENDENT_AMBULATORY_CARE_PROVIDER_SITE_OTHER): Payer: Self-pay | Admitting: Internal Medicine

## 2014-03-19 VITALS — BP 177/86 | HR 77 | Wt 121.5 lb

## 2014-03-19 DIAGNOSIS — G894 Chronic pain syndrome: Secondary | ICD-10-CM

## 2014-03-19 DIAGNOSIS — M5416 Radiculopathy, lumbar region: Secondary | ICD-10-CM

## 2014-03-19 DIAGNOSIS — I10 Essential (primary) hypertension: Secondary | ICD-10-CM

## 2014-03-19 DIAGNOSIS — N39490 Overflow incontinence: Secondary | ICD-10-CM

## 2014-03-19 MED ORDER — AMLODIPINE BESYLATE 5 MG PO TABS: 10.0000 mg | ORAL_TABLET | Freq: Every day | ORAL | Status: DC

## 2014-03-19 MED ORDER — LISINOPRIL 5 MG PO TABS: 10.0000 mg | ORAL_TABLET | Freq: Every day | ORAL | Status: DC

## 2014-03-19 MED ORDER — GABAPENTIN 300 MG PO CAPS: 600.0000 mg | ORAL_CAPSULE | Freq: Three times a day (TID) | ORAL | Status: DC

## 2014-03-19 MED ORDER — KETOROLAC TROMETHAMINE 60 MG/2ML IM SOLN: 60.0000 mg | Freq: Once | INTRAMUSCULAR | Status: DC

## 2014-03-19 MED ORDER — METOPROLOL TARTRATE 25 MG PO TABS: 25.0000 mg | ORAL_TABLET | Freq: Two times a day (BID) | ORAL | Status: DC

## 2014-03-19 NOTE — Patient Instructions (Addendum)
General Instructions: -Start taking gabapentin 300mg  three times daily. You may increase it to 600mg  at night. If you are not too sleep with this medication you may take it up to 600mg  three times per day.  -You have been referred to Neurosurgery for further evaluation of your symptoms.  -Your blood pressure medications were refilled today.  -Follow up in 3 months.   Please bring your medicines with you each time you come to clinic.  Medicines may include prescription medications, over-the-counter medications, herbal remedies, eye drops, vitamins, or other pills.      Progress Toward Treatment Goals:  Treatment Goal 03/19/2014  Blood pressure deteriorated  Stop smoking smoking the same amount  Prevent falls improved    Self Care Goals & Plans:  Self Care Goal 03/19/2014  Manage my medications bring my medications to every visit; refill my medications on time  Monitor my health -  Eat healthy foods -  Be physically active -  Stop smoking call [facility] (1-800-QUIT-NOW)  Prevent falls -  Meeting treatment goals maintain the current self-care plan    No flowsheet data found.   Care Management & Community Referrals:  Referral 03/19/2014  Referrals made for care management support none needed  Referrals made to community resources -

## 2014-03-20 ENCOUNTER — Ambulatory Visit: Payer: Self-pay | Admitting: Physical Medicine & Rehabilitation

## 2014-03-20 ENCOUNTER — Encounter: Payer: No Typology Code available for payment source | Attending: Physical Medicine & Rehabilitation

## 2014-03-20 DIAGNOSIS — N39490 Overflow incontinence: Secondary | ICD-10-CM | POA: Insufficient documentation

## 2014-03-20 DIAGNOSIS — G894 Chronic pain syndrome: Secondary | ICD-10-CM | POA: Insufficient documentation

## 2014-03-20 NOTE — Assessment & Plan Note (Signed)
BP Readings from Last 3 Encounters:  03/19/14 177/86  03/13/14 144/78  02/27/14 181/78    Lab Results  Component Value Date   NA 140 11/14/2013   K 3.7 11/14/2013   CREATININE 0.58 11/14/2013    Assessment: Blood pressure control: moderately elevated Progress toward BP goal:  deteriorated Comments: She is on Norvasc 10mg  daily, Lisinopril 10mg  daily, metoprolol 25mg  BID. She was in much distress due to pain during this visit which likely contributed to her elevated BP which had been controlled during previous visits with her current anti-hypertensive regimen.   Plan: Medications:  continue current medications Educational resources provided:   Self management tools provided:   Other plans: ultimately she needs improvement of her pain, she is being followed by the pain clinic but may also benefit from surgical evaluation (was seen once by Neurosurgery in July 2015 and was told that she did not meet criteria for surgery at that time). She has been referred urgently to Neurosurgery with repeat MRI lumbar spine ordered.

## 2014-03-20 NOTE — Progress Notes (Signed)
   Subjective:    Patient ID: Brenda [name], female    DOB: September 15, 1960, 53 y.o.   MRN: [id number]  HPI Brenda [name] is a 53 year old woman with PMH of HTN, chronic back pain, spinal stenosis (lumbar and cervical), IBS, who presents for evaluation of acute on chronic back pain. No recent trauma is reported but she notes she has had nocturnal bladder incontinence for almost 1 month with increased sciatic pain on the right hip/leg. Denies bowel incontinence or difficulty walking. Her back pain is severe and she is frustrated on not being prescribed "Percocet" by her pain clinic and at times states that she "will not be here for longer". She denies SI/HI. She continues to pursue the referral to the pain clinic at [facility] but was told that she needed to pay $100 co-pay in order to be seen there.  She continues to have tingling of the bilateral hands intermittently.    Review of Systems  Constitutional: Negative for fever, chills and appetite change.  Respiratory: Negative for cough and shortness of breath.   Cardiovascular: Negative for chest pain and leg swelling.  Gastrointestinal: Negative for nausea, vomiting and abdominal pain.  Genitourinary: Negative for dysuria and difficulty urinating.       Nocturia with occasional overflow incontinence   Musculoskeletal: Positive for back pain. Negative for myalgias and arthralgias.  Skin: Negative for rash.  Neurological: Negative for dizziness.  Psychiatric/Behavioral: Negative for suicidal ideas and self-injury.       Objective:   Physical Exam  Constitutional: She is oriented to person, place, and time. She appears well-developed and well-nourished. She appears distressed.  Very labile, tearful, angry   HENT:  Head: Atraumatic.  Neck: Normal range of motion.  Cardiovascular: Normal rate and regular rhythm.   Pulmonary/Chest: Effort normal and breath sounds normal. No respiratory distress. She has no wheezes.  Musculoskeletal: She exhibits  tenderness.  TTP of the cervical, thoracic and lumbar spine  Neurological: She is alert and oriented to person, place, and time. She has normal reflexes. She displays normal reflexes. No cranial nerve deficit. She exhibits normal muscle tone. Coordination normal.  Strength: UE: 5/5 in R and L except for grip strength that is 4/5 on the right (pt is right handed). Intrinsic muscle strength 5/5 bilaterally with adduction and abduction.  LE: 5/5 proximally and distally.  Sensation: Intact to light touch bilaterally and symmetrically.  Tinel's sign not present/negative bilaterally  Skin: Skin is warm and dry. No rash noted. She is not diaphoretic.  Psychiatric:  Labile mood, denies SI/HI   Nursing note and vitals reviewed.         Assessment & Plan:

## 2014-03-20 NOTE — Assessment & Plan Note (Signed)
She has worsening of sciatic on the right hip/leg but no weakness, numbness, her gait is stable per exam.  Nonetheless, given her hx of severe lumbar spinal stenosis per MRI in July 2015 and her new symptoms of nocturnal bladder incontinence, will repeat MRI and refer to Neurosurgery.

## 2014-03-20 NOTE — Assessment & Plan Note (Signed)
She reports nocturnal urinary incontinence for almost one month but denies dysuria/frequency. Given her hx of lumbar spine stenosis this is concerning for worsening disease.  Referred to Neurosurgery urgently Ordered MRI lumbar spine Wo contrast Case discussed with Dr. Dareen Piano who also recommends referral to Neurosurgery and MRI of lumbar spine

## 2014-03-20 NOTE — Assessment & Plan Note (Addendum)
She is being followed at the pain clinic but states that they have a "contract not to give her controlled substances". She has been started on gabapentin 100mg  PRN recently but reports that this "does not touch the pain".  She was given Toradol 60mg  IM injection during this visit for her acute pain Rx gabapentin 300mg  2 tablets TID PRN to be started as 600mg  qHS and increased as tolerated if she is not dizzy--she requested Lyrica but this medication is currently not available at the [facility] where she fills her prescriptions and its cost is too high for her to afford it currently.

## 2014-03-21 NOTE — Progress Notes (Signed)
INTERNAL MEDICINE TEACHING ATTENDING ADDENDUM - Bryleigh Ottaway, MD: I reviewed and discussed at the time of visit with the resident Dr. Kennerly, the patient's medical history, physical examination, diagnosis and results of pertinent tests and treatment and I agree with the patient's care as documented.  

## 2014-03-26 ENCOUNTER — Emergency Department (HOSPITAL_COMMUNITY)
Admission: EM | Admit: 2014-03-26 | Discharge: 2014-03-26 | Disposition: A | Discharge status: Home / Self Care | Payer: Self-pay | Attending: Emergency Medicine | Admitting: Emergency Medicine

## 2014-03-26 ENCOUNTER — Encounter (HOSPITAL_COMMUNITY): Payer: Self-pay

## 2014-03-26 ENCOUNTER — Emergency Department (HOSPITAL_COMMUNITY): Payer: Self-pay

## 2014-03-26 DIAGNOSIS — Z8701 Personal history of pneumonia (recurrent): Secondary | ICD-10-CM | POA: Insufficient documentation

## 2014-03-26 DIAGNOSIS — Z8673 Personal history of transient ischemic attack (TIA), and cerebral infarction without residual deficits: Secondary | ICD-10-CM | POA: Insufficient documentation

## 2014-03-26 DIAGNOSIS — K589 Irritable bowel syndrome without diarrhea: Secondary | ICD-10-CM | POA: Insufficient documentation

## 2014-03-26 DIAGNOSIS — G8929 Other chronic pain: Secondary | ICD-10-CM | POA: Insufficient documentation

## 2014-03-26 DIAGNOSIS — E785 Hyperlipidemia, unspecified: Secondary | ICD-10-CM | POA: Insufficient documentation

## 2014-03-26 DIAGNOSIS — Z7982 Long term (current) use of aspirin: Secondary | ICD-10-CM | POA: Insufficient documentation

## 2014-03-26 DIAGNOSIS — R112 Nausea with vomiting, unspecified: Secondary | ICD-10-CM | POA: Insufficient documentation

## 2014-03-26 DIAGNOSIS — M1990 Unspecified osteoarthritis, unspecified site: Secondary | ICD-10-CM | POA: Insufficient documentation

## 2014-03-26 DIAGNOSIS — I2510 Atherosclerotic heart disease of native coronary artery without angina pectoris: Secondary | ICD-10-CM | POA: Insufficient documentation

## 2014-03-26 DIAGNOSIS — I252 Old myocardial infarction: Secondary | ICD-10-CM | POA: Insufficient documentation

## 2014-03-26 DIAGNOSIS — Z8742 Personal history of other diseases of the female genital tract: Secondary | ICD-10-CM | POA: Insufficient documentation

## 2014-03-26 DIAGNOSIS — Z9889 Other specified postprocedural states: Secondary | ICD-10-CM | POA: Insufficient documentation

## 2014-03-26 DIAGNOSIS — F319 Bipolar disorder, unspecified: Secondary | ICD-10-CM | POA: Insufficient documentation

## 2014-03-26 DIAGNOSIS — I10 Essential (primary) hypertension: Secondary | ICD-10-CM | POA: Insufficient documentation

## 2014-03-26 DIAGNOSIS — F4310 Post-traumatic stress disorder, unspecified: Secondary | ICD-10-CM | POA: Insufficient documentation

## 2014-03-26 DIAGNOSIS — Z720 Tobacco use: Secondary | ICD-10-CM | POA: Insufficient documentation

## 2014-03-26 DIAGNOSIS — M549 Dorsalgia, unspecified: Secondary | ICD-10-CM | POA: Insufficient documentation

## 2014-03-26 DIAGNOSIS — M542 Cervicalgia: Secondary | ICD-10-CM | POA: Insufficient documentation

## 2014-03-26 DIAGNOSIS — I509 Heart failure, unspecified: Secondary | ICD-10-CM | POA: Insufficient documentation

## 2014-03-26 DIAGNOSIS — M79602 Pain in left arm: Secondary | ICD-10-CM | POA: Insufficient documentation

## 2014-03-26 DIAGNOSIS — Z87440 Personal history of urinary (tract) infections: Secondary | ICD-10-CM | POA: Insufficient documentation

## 2014-03-26 DIAGNOSIS — R079 Chest pain, unspecified: Secondary | ICD-10-CM | POA: Insufficient documentation

## 2014-03-26 DIAGNOSIS — Z79899 Other long term (current) drug therapy: Secondary | ICD-10-CM | POA: Insufficient documentation

## 2014-03-26 DIAGNOSIS — Z791 Long term (current) use of non-steroidal anti-inflammatories (NSAID): Secondary | ICD-10-CM | POA: Insufficient documentation

## 2014-03-26 DIAGNOSIS — Z862 Personal history of diseases of the blood and blood-forming organs and certain disorders involving the immune mechanism: Secondary | ICD-10-CM | POA: Insufficient documentation

## 2014-03-26 LAB — COMPREHENSIVE METABOLIC PANEL
ALK PHOS: 67 U/L (ref 39–117)
ALT: 6 U/L (ref 0–35)
AST: 16 U/L (ref 0–37)
Albumin: 3.8 g/dL (ref 3.5–5.2)
Anion gap: 13 (ref 5–15)
BUN: 11 mg/dL (ref 6–23)
CO2: 20 mEq/L (ref 19–32)
Calcium: 9.6 mg/dL (ref 8.4–10.5)
Chloride: 106 mEq/L (ref 96–112)
Creatinine, Ser: 0.48 mg/dL — ABNORMAL LOW (ref 0.50–1.10)
GFR calc Af Amer: >90 mL/min (ref >90)
GFR calc non Af Amer: >90 mL/min (ref >90)
Glucose, Bld: 108 mg/dL — ABNORMAL HIGH (ref 70–99)
POTASSIUM: 4.3 meq/L (ref 3.7–5.3)
Sodium: 139 mEq/L (ref 137–147)
Total Bilirubin: 0.2 mg/dL — ABNORMAL LOW (ref 0.3–1.2)
Total Protein: 7.6 g/dL (ref 6.0–8.3)

## 2014-03-26 LAB — RAPID URINE DRUG SCREEN, HOSP PERFORMED
Amphetamines: NOT DETECTED
BENZODIAZEPINES: POSITIVE — AB
Barbiturates: NOT DETECTED
Cocaine: NOT DETECTED
Opiates: NOT DETECTED
Tetrahydrocannabinol: NOT DETECTED

## 2014-03-26 LAB — I-STAT TROPONIN, ED: TROPONIN I, POC: 0 ng/mL (ref 0.00–0.08)

## 2014-03-26 LAB — CBC
HEMATOCRIT: 39.3 % (ref 36.0–46.0)
Hemoglobin: 13.2 g/dL (ref 12.0–15.0)
MCH: 32.2 pg (ref 26.0–34.0)
MCHC: 33.6 g/dL (ref 30.0–36.0)
MCV: 95.9 fL (ref 78.0–100.0)
Platelets: 274 10*3/uL (ref 150–400)
RBC: 4.1 MIL/uL (ref 3.87–5.11)
RDW: 13.5 % (ref 11.5–15.5)
WBC: 6.6 10*3/uL (ref 4.0–10.5)

## 2014-03-26 LAB — APTT: aPTT: 37 seconds (ref 24–37)

## 2014-03-26 LAB — PROTIME-INR
INR: 1.03 (ref 0.00–1.49)
PROTHROMBIN TIME: 13.6 s (ref 11.6–15.2)

## 2014-03-26 MED ORDER — BUTALBITAL-APAP-CAFFEINE 50-325-40 MG PO TABS
1.0000 | ORAL_TABLET | Freq: Four times a day (QID) | ORAL | Status: DC | PRN
Start: 2014-03-26 — End: 2014-11-02

## 2014-03-26 MED ORDER — NITROGLYCERIN 0.4 MG SL SUBL
0.4000 mg | SUBLINGUAL_TABLET | SUBLINGUAL | Status: AC | PRN
  Administered 2014-03-26 (×3): 0.4 mg via SUBLINGUAL
  Filled 2014-03-26: qty 1

## 2014-03-26 MED ORDER — KETOROLAC TROMETHAMINE 30 MG/ML IJ SOLN
30.0000 mg | Freq: Once | INTRAMUSCULAR | Status: AC
  Administered 2014-03-26: 30 mg via INTRAVENOUS
  Filled 2014-03-26: qty 1

## 2014-03-26 MED ORDER — ASPIRIN 81 MG PO CHEW
324.0000 mg | CHEWABLE_TABLET | Freq: Once | ORAL | Status: AC
Start: 2014-03-26 — End: 2014-03-26
  Administered 2014-03-26: 324 mg via ORAL
  Filled 2014-03-26: qty 4

## 2014-03-26 MED ORDER — PREDNISONE 20 MG PO TABS: 40.0000 mg | ORAL_TABLET | Freq: Every day | ORAL | Status: DC

## 2014-03-26 NOTE — Discharge Instructions (Signed)
Please call your doctor for a followup appointment within 24-48 hours. When you talk to your doctor please let them know that you were seen in the emergency department and have them acquire all of your records so that they can discuss the findings with you and formulate a treatment plan to fully care for your new and ongoing problems. ° °

## 2014-03-26 NOTE — ED Notes (Signed)
Per [facility]: pt. Complaint of episodes of vomiting x 24 hours. Neurologically intact upon EMS exam. Pt. Complaint of generalized pain 9/10 from "head to toe." Pt. States she has new chest "tightening", left arm pain, and increased spine pain. Pt. States she feels SOB. Denies HA.
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
## 2014-03-29 ENCOUNTER — Encounter: Payer: Self-pay | Admitting: Physical Medicine & Rehabilitation

## 2014-03-29 ENCOUNTER — Ambulatory Visit (HOSPITAL_BASED_OUTPATIENT_CLINIC_OR_DEPARTMENT_OTHER): Payer: Self-pay | Admitting: Physical Medicine & Rehabilitation

## 2014-03-29 VITALS — BP 143/78 | HR 76 | Resp 14

## 2014-03-29 DIAGNOSIS — M4802 Spinal stenosis, cervical region: Secondary | ICD-10-CM

## 2014-03-29 DIAGNOSIS — M47817 Spondylosis without myelopathy or radiculopathy, lumbosacral region: Secondary | ICD-10-CM

## 2014-03-29 DIAGNOSIS — M7918 Myalgia, other site: Secondary | ICD-10-CM | POA: Insufficient documentation

## 2014-03-29 DIAGNOSIS — G894 Chronic pain syndrome: Secondary | ICD-10-CM

## 2014-03-29 DIAGNOSIS — M791 Myalgia: Secondary | ICD-10-CM

## 2014-03-29 MED ORDER — KETOROLAC TROMETHAMINE 30 MG/ML IJ SOLN: 30.0000 mg | Freq: Once | INTRAMUSCULAR | Status: DC

## 2014-03-29 MED ORDER — KETOROLAC TROMETHAMINE 30 MG/ML IJ SOLN
30.0000 mg | Freq: Once | INTRAMUSCULAR | Status: AC
  Administered 2014-03-29: 30 mg via INTRAMUSCULAR

## 2014-03-29 MED ORDER — TRAMADOL HCL 50 MG PO TABS: 50.0000 mg | ORAL_TABLET | Freq: Three times a day (TID) | ORAL | Status: DC

## 2014-03-29 MED ORDER — KETOROLAC TROMETHAMINE 30 MG/ML IM SOLN: 30.0000 mg | Freq: Once | INTRAMUSCULAR | Status: DC
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
## 2014-03-29 NOTE — Patient Instructions (Signed)
Continue to recommend academic [facility] pain clinic that has addiction medicine specialist.  May benefit from lumbar medial branch blocks for chronic back pain and arthritis

## 2014-04-03 ENCOUNTER — Other Ambulatory Visit: Payer: Self-pay | Admitting: *Deleted

## 2014-04-03 DIAGNOSIS — K589 Irritable bowel syndrome without diarrhea: Secondary | ICD-10-CM

## 2014-04-08 ENCOUNTER — Other Ambulatory Visit: Payer: Self-pay | Admitting: Internal Medicine

## 2014-04-08 DIAGNOSIS — K589 Irritable bowel syndrome without diarrhea: Secondary | ICD-10-CM

## 2014-04-08 MED ORDER — DICYCLOMINE HCL 20 MG PO TABS: 20.0000 mg | ORAL_TABLET | Freq: Three times a day (TID) | ORAL | Status: DC

## 2014-04-09 NOTE — Telephone Encounter (Signed)
Called to pharm 

## 2014-04-18 ENCOUNTER — Telehealth: Payer: Self-pay | Admitting: *Deleted

## 2014-04-18 ENCOUNTER — Other Ambulatory Visit: Payer: Self-pay | Admitting: Internal Medicine

## 2014-04-18 DIAGNOSIS — I10 Essential (primary) hypertension: Secondary | ICD-10-CM

## 2014-04-18 MED ORDER — LISINOPRIL 10 MG PO TABS: 10.0000 mg | ORAL_TABLET | Freq: Every day | ORAL | Status: DC

## 2014-04-18 NOTE — Telephone Encounter (Signed)
Changed made in Epic to lisinopril 10mg  daily #90.  Yes, please call it in.   Thanks!  SK

## 2014-04-18 NOTE — Telephone Encounter (Signed)
Called to [facility]

## 2014-04-18 NOTE — Telephone Encounter (Signed)
Did you want to increase her lisinopril from 5mg  daily to 10mg  daily, written on 03/19/2014, written for #30, please review, adjust medlist and advise, i will call HD, thanks, please do a 90 day supply of med

## 2014-04-20 ENCOUNTER — Ambulatory Visit (HOSPITAL_COMMUNITY)
Admission: RE | Admit: 2014-04-20 | Discharge: 2014-04-20 | Disposition: A | Discharge status: Home / Self Care | Payer: Self-pay | Source: Ambulatory Visit | Attending: Internal Medicine | Admitting: Internal Medicine

## 2014-04-20 DIAGNOSIS — M4806 Spinal stenosis, lumbar region: Secondary | ICD-10-CM | POA: Insufficient documentation

## 2014-04-20 DIAGNOSIS — N39490 Overflow incontinence: Secondary | ICD-10-CM

## 2014-04-20 DIAGNOSIS — M5416 Radiculopathy, lumbar region: Secondary | ICD-10-CM

## 2014-04-20 DIAGNOSIS — M549 Dorsalgia, unspecified: Secondary | ICD-10-CM | POA: Insufficient documentation

## 2014-04-20 DIAGNOSIS — R32 Unspecified urinary incontinence: Secondary | ICD-10-CM | POA: Insufficient documentation

## 2014-04-20 DIAGNOSIS — G8929 Other chronic pain: Secondary | ICD-10-CM | POA: Insufficient documentation

## 2014-05-02 ENCOUNTER — Emergency Department (HOSPITAL_COMMUNITY): Payer: Self-pay

## 2014-05-02 ENCOUNTER — Encounter (HOSPITAL_COMMUNITY): Payer: Self-pay | Admitting: *Deleted

## 2014-05-02 ENCOUNTER — Emergency Department (HOSPITAL_COMMUNITY)
Admission: EM | Admit: 2014-05-02 | Discharge: 2014-05-02 | Disposition: A | Discharge status: Home / Self Care | Payer: Self-pay | Attending: Emergency Medicine | Admitting: Emergency Medicine

## 2014-05-02 DIAGNOSIS — I10 Essential (primary) hypertension: Secondary | ICD-10-CM | POA: Insufficient documentation

## 2014-05-02 DIAGNOSIS — S60519A Abrasion of unspecified hand, initial encounter: Secondary | ICD-10-CM | POA: Insufficient documentation

## 2014-05-02 DIAGNOSIS — Z87440 Personal history of urinary (tract) infections: Secondary | ICD-10-CM | POA: Insufficient documentation

## 2014-05-02 DIAGNOSIS — Y9289 Other specified places as the place of occurrence of the external cause: Secondary | ICD-10-CM | POA: Insufficient documentation

## 2014-05-02 DIAGNOSIS — S8990XA Unspecified injury of unspecified lower leg, initial encounter: Secondary | ICD-10-CM | POA: Insufficient documentation

## 2014-05-02 DIAGNOSIS — F319 Bipolar disorder, unspecified: Secondary | ICD-10-CM | POA: Insufficient documentation

## 2014-05-02 DIAGNOSIS — I2510 Atherosclerotic heart disease of native coronary artery without angina pectoris: Secondary | ICD-10-CM | POA: Insufficient documentation

## 2014-05-02 DIAGNOSIS — E785 Hyperlipidemia, unspecified: Secondary | ICD-10-CM | POA: Insufficient documentation

## 2014-05-02 DIAGNOSIS — T07XXXA Unspecified multiple injuries, initial encounter: Secondary | ICD-10-CM

## 2014-05-02 DIAGNOSIS — I509 Heart failure, unspecified: Secondary | ICD-10-CM | POA: Insufficient documentation

## 2014-05-02 DIAGNOSIS — Y9389 Activity, other specified: Secondary | ICD-10-CM | POA: Insufficient documentation

## 2014-05-02 DIAGNOSIS — M5489 Other dorsalgia: Secondary | ICD-10-CM

## 2014-05-02 DIAGNOSIS — S3992XA Unspecified injury of lower back, initial encounter: Secondary | ICD-10-CM | POA: Insufficient documentation

## 2014-05-02 DIAGNOSIS — Z7982 Long term (current) use of aspirin: Secondary | ICD-10-CM | POA: Insufficient documentation

## 2014-05-02 DIAGNOSIS — G8929 Other chronic pain: Secondary | ICD-10-CM | POA: Insufficient documentation

## 2014-05-02 DIAGNOSIS — Z862 Personal history of diseases of the blood and blood-forming organs and certain disorders involving the immune mechanism: Secondary | ICD-10-CM | POA: Insufficient documentation

## 2014-05-02 DIAGNOSIS — K589 Irritable bowel syndrome without diarrhea: Secondary | ICD-10-CM | POA: Insufficient documentation

## 2014-05-02 DIAGNOSIS — Z8673 Personal history of transient ischemic attack (TIA), and cerebral infarction without residual deficits: Secondary | ICD-10-CM | POA: Insufficient documentation

## 2014-05-02 DIAGNOSIS — Z7952 Long term (current) use of systemic steroids: Secondary | ICD-10-CM | POA: Insufficient documentation

## 2014-05-02 DIAGNOSIS — F419 Anxiety disorder, unspecified: Secondary | ICD-10-CM | POA: Insufficient documentation

## 2014-05-02 DIAGNOSIS — Z87448 Personal history of other diseases of urinary system: Secondary | ICD-10-CM | POA: Insufficient documentation

## 2014-05-02 DIAGNOSIS — I252 Old myocardial infarction: Secondary | ICD-10-CM | POA: Insufficient documentation

## 2014-05-02 DIAGNOSIS — Y998 Other external cause status: Secondary | ICD-10-CM | POA: Insufficient documentation

## 2014-05-02 DIAGNOSIS — Z720 Tobacco use: Secondary | ICD-10-CM | POA: Insufficient documentation

## 2014-05-02 DIAGNOSIS — S1091XA Abrasion of unspecified part of neck, initial encounter: Secondary | ICD-10-CM | POA: Insufficient documentation

## 2014-05-02 DIAGNOSIS — M1990 Unspecified osteoarthritis, unspecified site: Secondary | ICD-10-CM | POA: Insufficient documentation

## 2014-05-02 DIAGNOSIS — S0081XA Abrasion of other part of head, initial encounter: Secondary | ICD-10-CM | POA: Insufficient documentation

## 2014-05-02 DIAGNOSIS — Z23 Encounter for immunization: Secondary | ICD-10-CM | POA: Insufficient documentation

## 2014-05-02 DIAGNOSIS — Z79899 Other long term (current) drug therapy: Secondary | ICD-10-CM | POA: Insufficient documentation

## 2014-05-02 DIAGNOSIS — Z8701 Personal history of pneumonia (recurrent): Secondary | ICD-10-CM | POA: Insufficient documentation

## 2014-05-02 MED ORDER — IBUPROFEN 800 MG PO TABS
800.0000 mg | ORAL_TABLET | Freq: Once | ORAL | Status: AC
  Administered 2014-05-02: 800 mg via ORAL
  Filled 2014-05-02: qty 1

## 2014-05-02 MED ORDER — TETANUS-DIPHTH-ACELL PERTUSSIS 5-2.5-18.5 LF-MCG/0.5 IM SUSP
0.5000 mL | Freq: Once | INTRAMUSCULAR | Status: AC
  Administered 2014-05-02: 0.5 mL via INTRAMUSCULAR
  Filled 2014-05-02: qty 0.5

## 2014-05-02 MED ORDER — OXYCODONE-ACETAMINOPHEN 5-325 MG PO TABS
2.0000 | ORAL_TABLET | Freq: Once | ORAL | Status: AC
  Administered 2014-05-02: 2 via ORAL
  Filled 2014-05-02: qty 2

## 2014-05-02 MED ORDER — TRAMADOL HCL 50 MG PO TABS
50.0000 mg | ORAL_TABLET | Freq: Four times a day (QID) | ORAL | Status: DC | PRN
Start: 2014-05-02 — End: 2014-05-21

## 2014-05-02 MED ORDER — IBUPROFEN 800 MG PO TABS: 800.0000 mg | ORAL_TABLET | Freq: Three times a day (TID) | ORAL | Status: DC | PRN

## 2014-05-02 NOTE — Discharge Instructions (Signed)
Abrasion An abrasion is a cut or scrape of the skin. Abrasions do not extend through all layers of the skin and most heal within 10 days. It is important to care for your abrasion properly to prevent infection. CAUSES  Most abrasions are caused by falling on, or gliding across, the ground or other surface. When your skin rubs on something, the outer and inner layer of skin rubs off, causing an abrasion. DIAGNOSIS  Your caregiver will be able to diagnose an abrasion during a physical exam.  TREATMENT  Your treatment depends on how large and deep the abrasion is. Generally, your abrasion will be cleaned with water and a mild soap to remove any dirt or debris. An antibiotic ointment may be put over the abrasion to prevent an infection. A bandage (dressing) may be wrapped around the abrasion to keep it from getting dirty.  You may need a tetanus shot if:  You cannot remember when you had your last tetanus shot.  You have never had a tetanus shot.  The injury broke your skin. If you get a tetanus shot, your arm may swell, get red, and feel warm to the touch. This is common and not a problem. If you need a tetanus shot and you choose not to have one, there is a rare chance of getting tetanus. Sickness from tetanus can be serious.  HOME CARE INSTRUCTIONS   If a dressing was applied, change it at least once a day or as directed by your caregiver. If the bandage sticks, soak it off with warm water.   Wash the area with water and a mild soap to remove all the ointment 2 times a day. Rinse off the soap and pat the area dry with a clean towel.   Reapply any ointment as directed by your caregiver. This will help prevent infection and keep the bandage from sticking. Use gauze over the wound and under the dressing to help keep the bandage from sticking.   Change your dressing right away if it becomes wet or dirty.   Only take over-the-counter or prescription medicines for pain, discomfort, or fever as  directed by your caregiver.   Follow up with your caregiver within 24-48 hours for a wound check, or as directed. If you were not given a wound-check appointment, look closely at your abrasion for redness, swelling, or pus. These are signs of infection. SEEK IMMEDIATE MEDICAL CARE IF:   You have increasing pain in the wound.   You have redness, swelling, or tenderness around the wound.   You have pus coming from the wound.   You have a fever or persistent symptoms for more than 2-3 days.  You have a fever and your symptoms suddenly get worse.  You have a bad smell coming from the wound or dressing.  MAKE SURE YOU:   Understand these instructions.  Will watch your condition.  Will get help right away if you are not doing well or get worse. Document Released: 01/07/2005 Document Revised: 03/16/2012 Document Reviewed: 03/03/2011 [facility] Patient Information 2015 [place], [place]. This information is not intended to replace advice given to you by your health care provider. Make sure you discuss any questions you have with your health care provider.   Contusion A contusion is a deep bruise. Contusions are the result of an injury that caused bleeding under the skin. The contusion may turn blue, purple, or yellow. Minor injuries will give you a painless contusion, but more severe contusions may stay painful and  swollen for a few weeks.  CAUSES  A contusion is usually caused by a blow, trauma, or direct force to an area of the body. SYMPTOMS   Swelling and redness of the injured area.  Bruising of the injured area.  Tenderness and soreness of the injured area.  Pain. DIAGNOSIS  The diagnosis can be made by taking a history and physical exam. An X-ray, CT scan, or MRI may be needed to determine if there were any associated injuries, such as fractures. TREATMENT  Specific treatment will depend on what area of the body was injured. In general, the best treatment for a contusion is  resting, icing, elevating, and applying cold compresses to the injured area. Over-the-counter medicines may also be recommended for pain control. Ask your caregiver what the best treatment is for your contusion. HOME CARE INSTRUCTIONS   Put ice on the injured area.  Put ice in a plastic bag.  Place a towel between your skin and the bag.  Leave the ice on for 15-20 minutes, 3-4 times a day, or as directed by your health care provider.  Only take over-the-counter or prescription medicines for pain, discomfort, or fever as directed by your caregiver. Your caregiver may recommend avoiding anti-inflammatory medicines (aspirin, ibuprofen, and naproxen) for 48 hours because these medicines may increase bruising.  Rest the injured area.  If possible, elevate the injured area to reduce swelling. SEEK IMMEDIATE MEDICAL CARE IF:   You have increased bruising or swelling.  You have pain that is getting worse.  Your swelling or pain is not relieved with medicines. MAKE SURE YOU:   Understand these instructions.  Will watch your condition.  Will get help right away if you are not doing well or get worse. Document Released: 01/07/2005 Document Revised: 04/04/2013 Document Reviewed: 02/02/2011 [facility] Patient Information 2015 [place], [place]. This information is not intended to replace advice given to you by your health care provider. Make sure you discuss any questions you have with your health care provider.   RICE: Routine Care for Injuries The routine care of many injuries includes Rest, Ice, Compression, and Elevation (RICE). HOME CARE INSTRUCTIONS  Rest is needed to allow your body to heal. Routine activities can usually be resumed when comfortable. Injured tendons and bones can take up to 6 weeks to heal. Tendons are the cord-like structures that attach muscle to bone.  Ice following an injury helps keep the swelling down and reduces pain.  Put ice in a plastic bag.  Place a towel  between your skin and the bag.  Leave the ice on for 15-20 minutes, 3-4 times a day, or as directed by your health care provider. Do this while awake, for the first 24 to 48 hours. After that, continue as directed by your caregiver.  Compression helps keep swelling down. It also gives support and helps with discomfort. If an elastic bandage has been applied, it should be removed and reapplied every 3 to 4 hours. It should not be applied tightly, but firmly enough to keep swelling down. Watch fingers or toes for swelling, bluish discoloration, coldness, numbness, or excessive pain. If any of these problems occur, remove the bandage and reapply loosely. Contact your caregiver if these problems continue.  Elevation helps reduce swelling and decreases pain. With extremities, such as the arms, hands, legs, and feet, the injured area should be placed near or above the level of the heart, if possible. SEEK IMMEDIATE MEDICAL CARE IF:  You have persistent pain and swelling.  You develop redness, numbness, or unexpected weakness.  Your symptoms are getting worse rather than improving after several days. These symptoms may indicate that further evaluation or further X-rays are needed. Sometimes, X-rays may not show a small broken bone (fracture) until 1 week or 10 days later. Make a follow-up appointment with your caregiver. Ask when your X-ray results will be ready. Make sure you get your X-ray results. Document Released: 07/12/2000 Document Revised: 04/04/2013 Document Reviewed: 08/29/2010 [facility] Patient Information 2015 [place], [place]. This information is not intended to replace advice given to you by your health care provider. Make sure you discuss any questions you have with your health care provider.

## 2014-05-02 NOTE — ED Notes (Signed)
Patient is alert and orientedx4.  Patient was explained discharge instructions and they understood them with no questions.  The patient's friend, Nelwyn Salisbury is taking the patient home.
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
## 2014-05-02 NOTE — ED Notes (Signed)
Family at bedside. 

## 2014-05-03 ENCOUNTER — Telehealth: Payer: Self-pay | Admitting: *Deleted

## 2014-05-03 NOTE — Telephone Encounter (Signed)
Reviewed recent MRI lumbar spine - L4-L5 disease seems to be advanced, associated with marrow edema. There is no cord compression. Since we do not have any slots open today, we can schedule the patient early next week for an evaluation, and discussion of results. There was a referral ordered to neurosurgery by Dr Hayes Ludwig which has not been set up yet. We can follow up on that referral to see that the patient gets a date soon.

## 2014-05-03 NOTE — Telephone Encounter (Signed)
Pt wants mri results from last week

## 2014-05-07 ENCOUNTER — Encounter: Payer: Self-pay | Admitting: Licensed Clinical Social Worker

## 2014-05-07 ENCOUNTER — Telehealth: Payer: Self-pay | Admitting: Licensed Clinical Social Worker

## 2014-05-07 NOTE — Telephone Encounter (Signed)
CSW placed call to CSW to provide information to Ms. Riess regarding [employer].  CSW received updated application, now [employer] program through the [employer].  Pt notified of need to sign ROI on application.  Ms. Mccollom has an appointment on 1/26.  Pt states she will be at this appointment, requesting bus pass for return home.  Ms. Jeanbaptiste states appointment was scheduled outside of window to schedule [facility] transportation.  CSW will leave application for signature, copy of application for pt to keep, and bus pass for Ms.  Eggenberger.  Along with the application, pt aware CSW needed to complete letter documenting chronic homelessness.  CSW will leave copy of letter for pt's review and confirm pt is in agreement.

## 2014-05-08 ENCOUNTER — Ambulatory Visit (INDEPENDENT_AMBULATORY_CARE_PROVIDER_SITE_OTHER): Payer: Self-pay | Admitting: Internal Medicine

## 2014-05-08 ENCOUNTER — Encounter: Payer: Self-pay | Admitting: Internal Medicine

## 2014-05-08 VITALS — BP 157/113 | HR 90 | Temp 97.5°F | Wt 123.8 lb

## 2014-05-08 DIAGNOSIS — M129 Arthropathy, unspecified: Secondary | ICD-10-CM

## 2014-05-08 DIAGNOSIS — M47819 Spondylosis without myelopathy or radiculopathy, site unspecified: Secondary | ICD-10-CM

## 2014-05-08 DIAGNOSIS — I2510 Atherosclerotic heart disease of native coronary artery without angina pectoris: Secondary | ICD-10-CM

## 2014-05-08 DIAGNOSIS — I10 Essential (primary) hypertension: Secondary | ICD-10-CM

## 2014-05-08 MED ORDER — PREDNISONE 20 MG PO TABS: 40.0000 mg | ORAL_TABLET | Freq: Every day | ORAL | Status: DC

## 2014-05-08 MED ORDER — NITROGLYCERIN 0.4 MG SL SUBL: 0.4000 mg | SUBLINGUAL_TABLET | SUBLINGUAL | Status: DC | PRN

## 2014-05-08 MED ORDER — KETOROLAC TROMETHAMINE 60 MG/2ML IM SOLN
60.0000 mg | Freq: Once | INTRAMUSCULAR | Status: AC
  Administered 2014-05-08: 60 mg via INTRAMUSCULAR

## 2014-05-08 NOTE — Patient Instructions (Signed)
-  Will give you toradol injection today for your pain  -Start taking prednisone 40 mg daily for 5 days for inflammation  -Keep taking your other pain medications as before and follow-up with pain clinic -Hopefully you will hear from neurosurgery at [facility] soon  -I refilled your nitroglycerin  -Will make you a urology appointment -Nice meeting you day, please follow-up with Dr. Hayes Ludwig in 2 weeks   General Instructions:   Please bring your medicines with you each time you come to clinic.  Medicines may include prescription medications, over-the-counter medications, herbal remedies, eye drops, vitamins, or other pills.   Progress Toward Treatment Goals:  Treatment Goal 03/19/2014  Blood pressure deteriorated  Stop smoking smoking the same amount  Prevent falls improved    Self Care Goals & Plans:  Self Care Goal 05/08/2014  Manage my medications take my medicines as prescribed  Monitor my health -  Eat healthy foods -  Be physically active -  Stop smoking -  Prevent falls -  Meeting treatment goals -    No flowsheet data found.   Care Management & Community Referrals:  Referral 03/19/2014  Referrals made for care management support none needed  Referrals made to community resources -
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
## 2014-05-09 NOTE — Assessment & Plan Note (Signed)
Assessment: Pt with moderately well-controlled hypertension compliant with three-class (CCB, ACEi, & diuretic) anti-hypertensive therapy who presents with blood pressure of 157/113 in setting of uncontrolled pain.   Plan: -BP 157/113 not at goal <140/90 most likely in setting of uncontrolled pain  -Continue amlodipine 10 mg daily, lisinopril 10 mg daily, and metoprolol tartrate 25 mg BID -Last CMP on 03/26/14 normal
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
## 2014-05-09 NOTE — Progress Notes (Signed)
Internal Medicine Clinic Attending  Case discussed with Dr. Rabbani soon after the resident saw the patient.  We reviewed the resident's history and exam and pertinent patient test results.  I agree with the assessment, diagnosis, and plan of care documented in the resident's note.  

## 2014-05-14 ENCOUNTER — Ambulatory Visit: Payer: Self-pay | Admitting: Internal Medicine

## 2014-05-15 ENCOUNTER — Telehealth: Payer: Self-pay | Admitting: Licensed Clinical Social Worker

## 2014-05-15 NOTE — Telephone Encounter (Signed)
CSW returned call to Ms. Virl Cagey, with [employer].  Ms. Virl Cagey states Ms. Cassata does not qualify for their program as pt has not shown continued chronic homelessness.  CSW will notify Ms. Garms.

## 2014-05-21 ENCOUNTER — Ambulatory Visit (HOSPITAL_BASED_OUTPATIENT_CLINIC_OR_DEPARTMENT_OTHER): Payer: No Typology Code available for payment source | Admitting: Physical Medicine & Rehabilitation

## 2014-05-21 ENCOUNTER — Encounter: Payer: No Typology Code available for payment source | Attending: Physical Medicine & Rehabilitation

## 2014-05-21 ENCOUNTER — Encounter: Payer: Self-pay | Admitting: Physical Medicine & Rehabilitation

## 2014-05-21 VITALS — BP 153/84 | HR 74 | Resp 14

## 2014-05-21 DIAGNOSIS — M791 Myalgia: Secondary | ICD-10-CM

## 2014-05-21 DIAGNOSIS — M25511 Pain in right shoulder: Secondary | ICD-10-CM

## 2014-05-21 DIAGNOSIS — M47819 Spondylosis without myelopathy or radiculopathy, site unspecified: Secondary | ICD-10-CM

## 2014-05-21 DIAGNOSIS — M7918 Myalgia, other site: Secondary | ICD-10-CM

## 2014-05-21 DIAGNOSIS — M479 Spondylosis, unspecified: Secondary | ICD-10-CM

## 2014-05-21 DIAGNOSIS — M4802 Spinal stenosis, cervical region: Secondary | ICD-10-CM

## 2014-05-21 DIAGNOSIS — G894 Chronic pain syndrome: Secondary | ICD-10-CM | POA: Insufficient documentation

## 2014-05-21 MED ORDER — TRAMADOL HCL 50 MG PO TABS: 50.0000 mg | ORAL_TABLET | Freq: Four times a day (QID) | ORAL | Status: DC | PRN

## 2014-05-21 MED ORDER — KETOROLAC TROMETHAMINE 30 MG/ML IJ SOLN
30.0000 mg | Freq: Once | INTRAMUSCULAR | Status: AC
  Administered 2014-05-21: 30 mg via INTRAMUSCULAR

## 2014-05-21 NOTE — Addendum Note (Signed)
Addended by: Caro Hight on: 05/21/2014 02:24 PM   Modules accepted: Orders
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
## 2014-05-29 ENCOUNTER — Ambulatory Visit: Payer: Self-pay | Admitting: Physical Medicine & Rehabilitation

## 2014-05-30 ENCOUNTER — Ambulatory Visit (INDEPENDENT_AMBULATORY_CARE_PROVIDER_SITE_OTHER): Payer: No Typology Code available for payment source | Admitting: Internal Medicine

## 2014-05-30 ENCOUNTER — Encounter: Payer: Self-pay | Admitting: Internal Medicine

## 2014-05-30 VITALS — BP 158/73 | HR 78 | Temp 98.2°F | Resp 20 | Ht 63.5 in | Wt 124.4 lb

## 2014-05-30 DIAGNOSIS — M545 Low back pain: Secondary | ICD-10-CM

## 2014-05-30 DIAGNOSIS — Z5900 Homelessness unspecified: Secondary | ICD-10-CM

## 2014-05-30 DIAGNOSIS — G894 Chronic pain syndrome: Secondary | ICD-10-CM

## 2014-05-30 DIAGNOSIS — G8929 Other chronic pain: Secondary | ICD-10-CM

## 2014-05-30 DIAGNOSIS — M549 Dorsalgia, unspecified: Principal | ICD-10-CM

## 2014-05-30 DIAGNOSIS — I10 Essential (primary) hypertension: Secondary | ICD-10-CM

## 2014-05-30 MED ORDER — METHOCARBAMOL 500 MG PO TABS: 500.0000 mg | ORAL_TABLET | Freq: Four times a day (QID) | ORAL | Status: DC | PRN

## 2014-05-30 MED ORDER — KETOROLAC TROMETHAMINE 30 MG/ML IJ SOLN
30.0000 mg | Freq: Once | INTRAMUSCULAR | Status: AC
  Administered 2014-05-30: 30 mg via INTRAMUSCULAR

## 2014-05-30 NOTE — Patient Instructions (Signed)
General Instructions: -Please call [facility] at your earliest convenience to start arranging transportation to your appointment with Neurosurgery at [facility] on April 19th.  -Follow up with Dr. Read Drivers for ongoing pain treatment.  -Follow up with [place] in 3 months or sooner as needed.   Please bring your medicines with you each time you come to clinic.  Medicines may include prescription medications, over-the-counter medications, herbal remedies, eye drops, vitamins, or other pills.   Progress Toward Treatment Goals:  Treatment Goal 03/19/2014  Blood pressure deteriorated  Stop smoking smoking the same amount  Prevent falls improved    Self Care Goals & Plans:  Self Care Goal 05/30/2014  Manage my medications take my medicines as prescribed; refill my medications on time  Monitor my health keep track of my blood pressure  Eat healthy foods (No Data)  Be physically active find an activity I enjoy  Stop smoking -  Prevent falls -  Meeting treatment goals -    No flowsheet data found.   Care Management & Community Referrals:  Referral 03/19/2014  Referrals made for care management support none needed  Referrals made to community resources -

## 2014-05-31 NOTE — Progress Notes (Signed)
   Subjective:    Patient ID: Brenda [name], female    DOB: 23-Oct-1960, 54 y.o.   MRN: [id number]  HPI Brenda [name] is a 54 yr old with PMH of HTN, HLP, chronic low back pain, IBS, CAD, bipolar disorder, PTDS, depression/anxiety, and homelessness who presents for follow up visit for her low back pain.  She continues to see Dr. Letta Pate for pain management of her low back pain. Her pain is slightly worse during this visit.  She has an appointment coming up with Neurosurgery at [facility].  Her housing situation has caused her distress. She is still at the shelter but has been recently denied more permanent housing. She states that she has been homeless for at least 3 years, had to live in a tent at some point, has moved 12 times in the past 3 years and has spent months at a time at shelters. She is concerned that not having a more permanent housing situation will worsen her mental health condition, making her anxiety and bipolar disorder worse.   Review of Systems  Constitutional: Negative for fever, chills, activity change, appetite change, fatigue and unexpected weight change.  Respiratory: Negative for cough, shortness of breath and wheezing.   Cardiovascular: Negative for chest pain, palpitations and leg swelling.  Gastrointestinal: Negative for abdominal pain.  Genitourinary: Negative for dysuria, flank pain and difficulty urinating.  Musculoskeletal: Positive for back pain.  Neurological: Negative for dizziness and light-headedness.  Psychiatric/Behavioral: Negative for suicidal ideas. The patient is nervous/anxious.        Objective:   Physical Exam  Constitutional: She is oriented to person, place, and time. She appears well-developed and well-nourished.  Cardiovascular: Normal rate and regular rhythm.   Pulmonary/Chest: Effort normal. No respiratory distress.  Neurological: She is alert and oriented to person, place, and time. Coordination normal.  Skin: Skin is warm and dry. She is not  diaphoretic.  Psychiatric:  Labile mood, tearful at times   Nursing note and vitals reviewed.         Assessment & Plan:

## 2014-06-01 ENCOUNTER — Encounter: Payer: Self-pay | Admitting: Internal Medicine

## 2014-06-01 NOTE — Progress Notes (Signed)
INTERNAL MEDICINE TEACHING ATTENDING ADDENDUM - Emmitte Surgeon, MD: I reviewed and discussed at the time of visit with the resident Dr. Kennerly, the patient's medical history, physical examination, diagnosis and results of pertinent tests and treatment and I agree with the patient's care as documented.  

## 2014-06-01 NOTE — Assessment & Plan Note (Signed)
She has been denied more permanent housing for unclear reasons.  -Referred to CSW for further assistance with housing.  -The patient will continue staying at the shelter for now.

## 2014-06-01 NOTE — Assessment & Plan Note (Signed)
Refilled Robaxin

## 2014-06-01 NOTE — Assessment & Plan Note (Signed)
BP Readings from Last 3 Encounters:  05/30/14 158/73  05/21/14 153/84  05/08/14 157/113    Lab Results  Component Value Date   NA 139 03/26/2014   K 4.3 03/26/2014   CREATININE 0.48* 03/26/2014    Assessment: Blood pressure control:  not controlled Progress toward BP goal:   not at goal Comments: She is on lisinopril 10mg  daily, metoprolol 25mg  BID, amlodipine 10mg  daily. She reports being compliant with her medications but again is in pain during this visit which is likely making her BP to be elevated.   Plan: Medications:  continue current medications Educational resources provided: brochure Self management tools provided: home blood pressure logbook Other plans: Follow up in 1 month

## 2014-06-01 NOTE — Assessment & Plan Note (Signed)
Her pain is somewhat worse during this visit. No signs/symptoms of cord compression with no increase in paresthesias/weakness, no loss of bowel/bladder control.  She plans to continue seeing Dr. Letta Pate for pain management. She has appointment with Neurosurgery at [facility] on April 19th.  -She received toradol 30mg  IM in clinic with some pain relief.  -Referred her to the [facility], [place], for assistance with transportation to the [facility] appointment.

## 2014-07-03 ENCOUNTER — Ambulatory Visit (HOSPITAL_BASED_OUTPATIENT_CLINIC_OR_DEPARTMENT_OTHER): Payer: No Typology Code available for payment source | Admitting: Physical Medicine & Rehabilitation

## 2014-07-03 ENCOUNTER — Encounter: Payer: No Typology Code available for payment source | Attending: Physical Medicine & Rehabilitation

## 2014-07-03 ENCOUNTER — Encounter: Payer: Self-pay | Admitting: Physical Medicine & Rehabilitation

## 2014-07-03 ENCOUNTER — Ambulatory Visit: Payer: Self-pay

## 2014-07-03 ENCOUNTER — Ambulatory Visit: Payer: No Typology Code available for payment source

## 2014-07-03 VITALS — BP 130/76 | HR 81 | Resp 14

## 2014-07-03 DIAGNOSIS — G8929 Other chronic pain: Secondary | ICD-10-CM

## 2014-07-03 DIAGNOSIS — M7918 Myalgia, other site: Secondary | ICD-10-CM

## 2014-07-03 DIAGNOSIS — M791 Myalgia: Secondary | ICD-10-CM

## 2014-07-03 DIAGNOSIS — M4802 Spinal stenosis, cervical region: Secondary | ICD-10-CM

## 2014-07-03 DIAGNOSIS — G894 Chronic pain syndrome: Secondary | ICD-10-CM | POA: Insufficient documentation

## 2014-07-03 DIAGNOSIS — F1910 Other psychoactive substance abuse, uncomplicated: Secondary | ICD-10-CM

## 2014-07-03 DIAGNOSIS — M549 Dorsalgia, unspecified: Secondary | ICD-10-CM

## 2014-07-03 MED ORDER — TRAMADOL HCL 50 MG PO TABS: 50.0000 mg | ORAL_TABLET | Freq: Four times a day (QID) | ORAL | Status: DC | PRN
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
## 2014-07-03 NOTE — Patient Instructions (Signed)
We will not replace the prescription again if you lose it.

## 2014-07-04 ENCOUNTER — Other Ambulatory Visit: Payer: Self-pay | Admitting: *Deleted

## 2014-07-04 DIAGNOSIS — G894 Chronic pain syndrome: Secondary | ICD-10-CM

## 2014-07-05 MED ORDER — METHOCARBAMOL 500 MG PO TABS: 500.0000 mg | ORAL_TABLET | Freq: Four times a day (QID) | ORAL | Status: DC | PRN

## 2014-07-06 ENCOUNTER — Encounter (HOSPITAL_COMMUNITY): Payer: Self-pay | Admitting: Emergency Medicine

## 2014-07-06 ENCOUNTER — Emergency Department (HOSPITAL_COMMUNITY)
Admission: EM | Admit: 2014-07-06 | Discharge: 2014-07-07 | Disposition: A | Discharge status: Home / Self Care | Payer: No Typology Code available for payment source | Attending: Emergency Medicine | Admitting: Emergency Medicine

## 2014-07-06 DIAGNOSIS — Z87448 Personal history of other diseases of urinary system: Secondary | ICD-10-CM | POA: Insufficient documentation

## 2014-07-06 DIAGNOSIS — Z720 Tobacco use: Secondary | ICD-10-CM | POA: Insufficient documentation

## 2014-07-06 DIAGNOSIS — I2510 Atherosclerotic heart disease of native coronary artery without angina pectoris: Secondary | ICD-10-CM | POA: Insufficient documentation

## 2014-07-06 DIAGNOSIS — Y939 Activity, unspecified: Secondary | ICD-10-CM | POA: Insufficient documentation

## 2014-07-06 DIAGNOSIS — Z9889 Other specified postprocedural states: Secondary | ICD-10-CM | POA: Insufficient documentation

## 2014-07-06 DIAGNOSIS — Z7952 Long term (current) use of systemic steroids: Secondary | ICD-10-CM | POA: Insufficient documentation

## 2014-07-06 DIAGNOSIS — Z8673 Personal history of transient ischemic attack (TIA), and cerebral infarction without residual deficits: Secondary | ICD-10-CM | POA: Insufficient documentation

## 2014-07-06 DIAGNOSIS — I10 Essential (primary) hypertension: Secondary | ICD-10-CM | POA: Insufficient documentation

## 2014-07-06 DIAGNOSIS — Z8701 Personal history of pneumonia (recurrent): Secondary | ICD-10-CM | POA: Insufficient documentation

## 2014-07-06 DIAGNOSIS — F4310 Post-traumatic stress disorder, unspecified: Secondary | ICD-10-CM | POA: Insufficient documentation

## 2014-07-06 DIAGNOSIS — Z87440 Personal history of urinary (tract) infections: Secondary | ICD-10-CM | POA: Insufficient documentation

## 2014-07-06 DIAGNOSIS — M1990 Unspecified osteoarthritis, unspecified site: Secondary | ICD-10-CM | POA: Insufficient documentation

## 2014-07-06 DIAGNOSIS — Z7982 Long term (current) use of aspirin: Secondary | ICD-10-CM | POA: Insufficient documentation

## 2014-07-06 DIAGNOSIS — S2231XA Fracture of one rib, right side, initial encounter for closed fracture: Secondary | ICD-10-CM | POA: Insufficient documentation

## 2014-07-06 DIAGNOSIS — F319 Bipolar disorder, unspecified: Secondary | ICD-10-CM | POA: Insufficient documentation

## 2014-07-06 DIAGNOSIS — F419 Anxiety disorder, unspecified: Secondary | ICD-10-CM | POA: Insufficient documentation

## 2014-07-06 DIAGNOSIS — E785 Hyperlipidemia, unspecified: Secondary | ICD-10-CM | POA: Insufficient documentation

## 2014-07-06 DIAGNOSIS — I252 Old myocardial infarction: Secondary | ICD-10-CM | POA: Insufficient documentation

## 2014-07-06 DIAGNOSIS — X58XXXA Exposure to other specified factors, initial encounter: Secondary | ICD-10-CM | POA: Insufficient documentation

## 2014-07-06 DIAGNOSIS — Y929 Unspecified place or not applicable: Secondary | ICD-10-CM | POA: Insufficient documentation

## 2014-07-06 DIAGNOSIS — Z79899 Other long term (current) drug therapy: Secondary | ICD-10-CM | POA: Insufficient documentation

## 2014-07-06 DIAGNOSIS — K589 Irritable bowel syndrome without diarrhea: Secondary | ICD-10-CM | POA: Insufficient documentation

## 2014-07-06 DIAGNOSIS — Z862 Personal history of diseases of the blood and blood-forming organs and certain disorders involving the immune mechanism: Secondary | ICD-10-CM | POA: Insufficient documentation

## 2014-07-06 DIAGNOSIS — Y999 Unspecified external cause status: Secondary | ICD-10-CM | POA: Insufficient documentation

## 2014-07-06 DIAGNOSIS — Z8742 Personal history of other diseases of the female genital tract: Secondary | ICD-10-CM | POA: Insufficient documentation

## 2014-07-06 DIAGNOSIS — Z951 Presence of aortocoronary bypass graft: Secondary | ICD-10-CM | POA: Insufficient documentation

## 2014-07-06 LAB — CBC WITH DIFFERENTIAL/PLATELET
Basophils Absolute: 0 10*3/uL (ref 0.0–0.1)
Basophils Relative: 0 % (ref 0–1)
Eosinophils Absolute: 0.3 10*3/uL (ref 0.0–0.7)
Eosinophils Relative: 4 % (ref 0–5)
HCT: 39.4 % (ref 36.0–46.0)
HEMOGLOBIN: 13.2 g/dL (ref 12.0–15.0)
LYMPHS PCT: 39 % (ref 12–46)
Lymphs Abs: 3.4 10*3/uL (ref 0.7–4.0)
MCH: 33.4 pg (ref 26.0–34.0)
MCHC: 33.5 g/dL (ref 30.0–36.0)
MCV: 99.7 fL (ref 78.0–100.0)
MONO ABS: 0.7 10*3/uL (ref 0.1–1.0)
MONOS PCT: 7 % (ref 3–12)
NEUTROS ABS: 4.5 10*3/uL (ref 1.7–7.7)
Neutrophils Relative %: 50 % (ref 43–77)
Platelets: 204 10*3/uL (ref 150–400)
RBC: 3.95 MIL/uL (ref 3.87–5.11)
RDW: 13.2 % (ref 11.5–15.5)
WBC: 8.9 10*3/uL (ref 4.0–10.5)

## 2014-07-06 MED ORDER — FENTANYL CITRATE 0.05 MG/ML IJ SOLN
50.0000 ug | Freq: Once | INTRAMUSCULAR | Status: AC
  Administered 2014-07-06: 50 ug via INTRAVENOUS
  Filled 2014-07-06: qty 2

## 2014-07-06 MED ORDER — ONDANSETRON HCL 4 MG/2ML IJ SOLN
4.0000 mg | Freq: Once | INTRAMUSCULAR | Status: AC
  Administered 2014-07-06: 4 mg via INTRAVENOUS
  Filled 2014-07-06: qty 2

## 2014-07-06 MED ORDER — SODIUM CHLORIDE 0.9 % IV SOLN
1000.0000 mL | Freq: Once | INTRAVENOUS | Status: AC
  Administered 2014-07-06: 1000 mL via INTRAVENOUS

## 2014-07-06 NOTE — ED Notes (Signed)
Patient is complaining of right side pain x 6 days. Patient was brought in by ptar.

## 2014-07-06 NOTE — ED Notes (Signed)
Bed: KV35 Expected date:  Expected time:  Means of arrival:  Comments: EMS 54 yo female pain right side/rib cage area x 6 days

## 2014-07-07 ENCOUNTER — Emergency Department (HOSPITAL_COMMUNITY): Payer: No Typology Code available for payment source

## 2014-07-07 LAB — COMPREHENSIVE METABOLIC PANEL
ALBUMIN: 4.2 g/dL (ref 3.5–5.2)
ALT: 9 U/L (ref 0–35)
ANION GAP: 7 (ref 5–15)
AST: 18 U/L (ref 0–37)
Alkaline Phosphatase: 76 U/L (ref 39–117)
BUN: 16 mg/dL (ref 6–23)
CO2: 27 mmol/L (ref 19–32)
Calcium: 8.9 mg/dL (ref 8.4–10.5)
Chloride: 105 mmol/L (ref 96–112)
Creatinine, Ser: 0.71 mg/dL (ref 0.50–1.10)
GFR calc non Af Amer: >90 mL/min (ref >90)
Glucose, Bld: 99 mg/dL (ref 70–99)
Potassium: 3.8 mmol/L (ref 3.5–5.1)
Sodium: 139 mmol/L (ref 135–145)
TOTAL PROTEIN: 7.7 g/dL (ref 6.0–8.3)
Total Bilirubin: 0.2 mg/dL — ABNORMAL LOW (ref 0.3–1.2)

## 2014-07-07 LAB — LIPASE, BLOOD: LIPASE: 26 U/L (ref 11–59)

## 2014-07-07 MED ORDER — HYDROCODONE-ACETAMINOPHEN 5-325 MG PO TABS: 1.0000 | ORAL_TABLET | Freq: Two times a day (BID) | ORAL | Status: DC | PRN

## 2014-07-07 MED ORDER — IBUPROFEN 800 MG PO TABS
800.0000 mg | ORAL_TABLET | Freq: Once | ORAL | Status: AC
  Administered 2014-07-07: 800 mg via ORAL
  Filled 2014-07-07: qty 1

## 2014-07-07 MED ORDER — HYDROCODONE-ACETAMINOPHEN 5-325 MG PO TABS
2.0000 | ORAL_TABLET | Freq: Once | ORAL | Status: AC
  Administered 2014-07-07: 2 via ORAL
  Filled 2014-07-07: qty 2

## 2014-07-07 NOTE — Discharge Instructions (Signed)
Rib Fracture Brenda [name], your x-ray shows what might be a rib fracture. You may just have bruising.  Take pain medication as prescribed and see a primary care physician within 3 days for follow-up. If symptoms worsen come back to the emergency department immediately. Thank you. A rib fracture is a break or crack in one of the bones of the ribs. The ribs are like a cage that goes around your upper chest. A broken or cracked rib is often painful, but most do not cause other problems. Most rib fractures heal on their own in 1-3 months. HOME CARE  Avoid activities that cause pain to the injured area. Protect your injured area.  Slowly increase activity as told by your doctor.  Take medicine as told by your doctor.  Put ice on the injured area for the first 1-2 days after you have been treated or as told by your doctor.  Put ice in a plastic bag.  Place a towel between your skin and the bag.  Leave the ice on for 15-20 minutes at a time, every 2 hours while you are awake.  Do deep breathing as told by your doctor. You may be told to:  Take deep breaths many times a day.  Cough many times a day while hugging a pillow.  Use a device (incentive spirometer) to perform deep breathing many times a day.  Drink enough fluids to keep your pee (urine) clear or pale yellow.   Do not wear a rib belt or binder. These do not allow you to breathe deeply. GET HELP RIGHT AWAY IF:   You have a fever.  You have trouble breathing.   You cannot stop coughing.  You cough up thick or bloody spit (mucus).   You feel sick to your stomach (nauseous), throw up (vomit), or have belly (abdominal) pain.   Your pain gets worse and medicine does not help.  MAKE SURE YOU:   Understand these instructions.  Will watch your condition.  Will get help right away if you are not doing well or get worse. Document Released: 01/07/2008 Document Revised: 07/25/2012 Document Reviewed: 06/01/2012 [facility]  Patient Information 2015 [place], [place]. This information is not intended to replace advice given to you by your health care provider. Make sure you discuss any questions you have with your health care provider.
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
## 2014-07-07 NOTE — ED Notes (Signed)
Requested patient to urinate. 

## 2014-07-09 NOTE — Telephone Encounter (Signed)
Called to pharm 

## 2014-07-27 ENCOUNTER — Telehealth: Payer: Self-pay | Admitting: Licensed Clinical Social Worker

## 2014-07-27 NOTE — Telephone Encounter (Signed)
Ms. Raglin placed call to CSW this morning.  Pt states she has obtained housing and plans to move in at the beginning of May 2016.  Housing is through the Chronic Homelessness program at [place], [place]. [place].  Ms. Lish is requesting a letter from her physician to exclude her from the work requirement of the housing program.  Pt states since she is unemployed and appealing her disability, housing informed her of the need to provide letter.  CSW informed Ms. Heiny this would be forwarded to PCP and to allow 2 weeks.  Pt provided CSW with new address starting May 2016.  Transportation: Ms. Czaplicki informed CSW of a medical appointment at [facility] on 07/31/14 and inquired to transportation was still available.  Pt is current with her [employer], transportation is still available.  CSW reminded pt to call today, as she is outside of the 3 business day time frame.  CSW informed Ms. Kimbrough to contact this worker before 1330 today, should she have difficulty scheduling.

## 2014-08-09 ENCOUNTER — Encounter: Payer: Self-pay | Admitting: Internal Medicine

## 2014-08-14 NOTE — Telephone Encounter (Signed)
PCP completed letter.  CSW faxed to [facility] and mailed copy to Ms. Eklund.
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
## 2014-08-24 ENCOUNTER — Encounter: Payer: Self-pay | Admitting: *Deleted

## 2014-08-28 ENCOUNTER — Encounter: Payer: Self-pay | Attending: Physical Medicine & Rehabilitation

## 2014-08-28 ENCOUNTER — Ambulatory Visit: Payer: Self-pay | Admitting: Physical Medicine & Rehabilitation

## 2014-08-28 DIAGNOSIS — G894 Chronic pain syndrome: Secondary | ICD-10-CM | POA: Insufficient documentation

## 2014-10-22 IMAGING — CR DG LUMBAR SPINE 2-3V
3 series · 3 of 3 positions shown · non-contrast
Comparison: The lumbar spine radiographs - 03/19/2012; lumbar spine
MRI - 10/22/2011; CT abdomen pelvis - 05/11/2011

CLINICAL DATA: Post fall, now with low back pain

LUMBAR SPINE - 2-3 VIEW

[t lumbar spine ap]
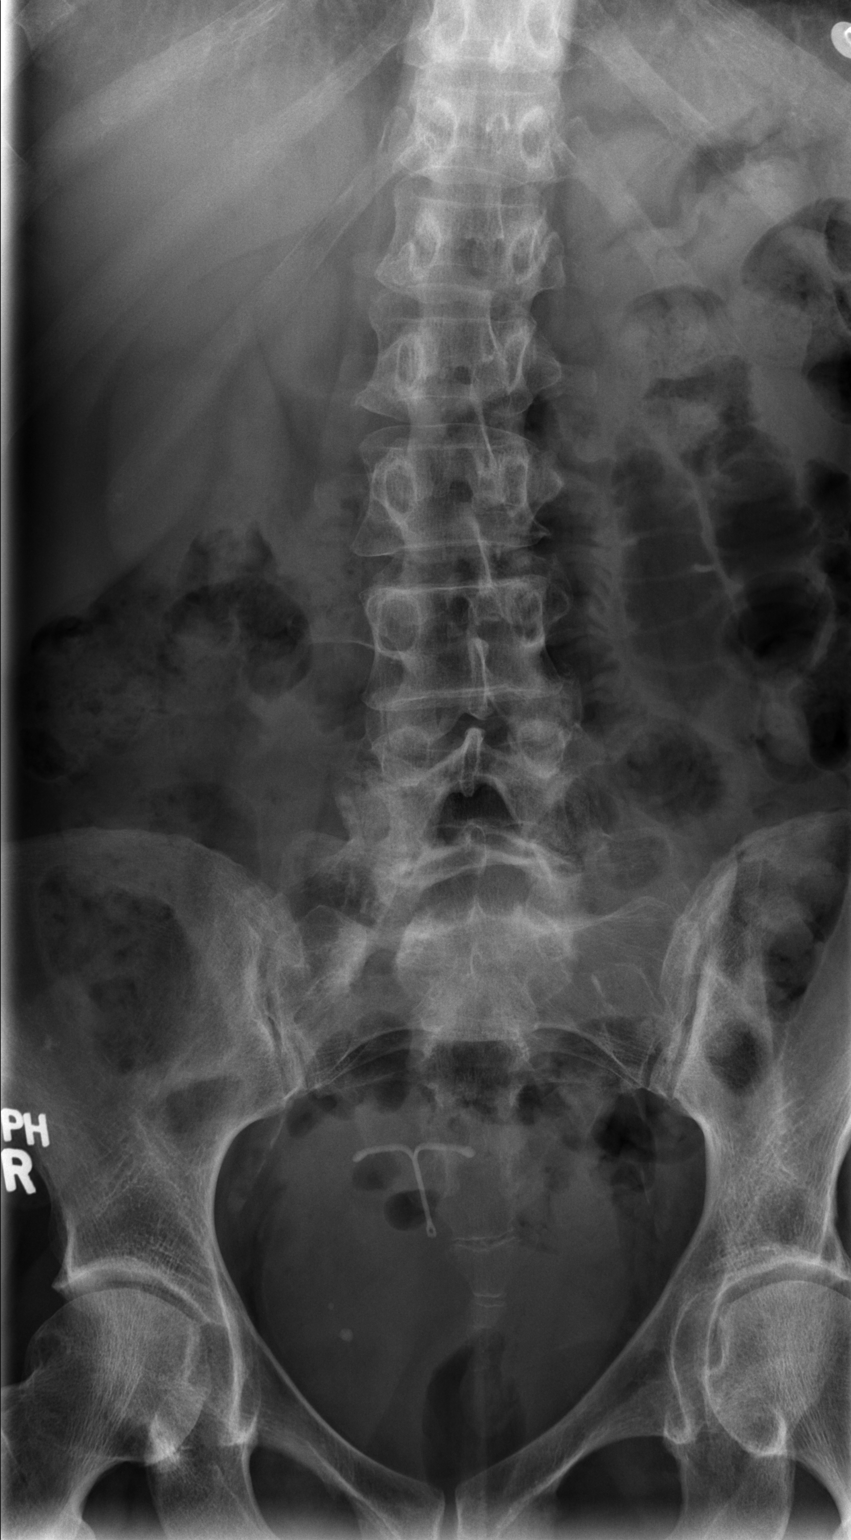

[t lumbar spine lat]
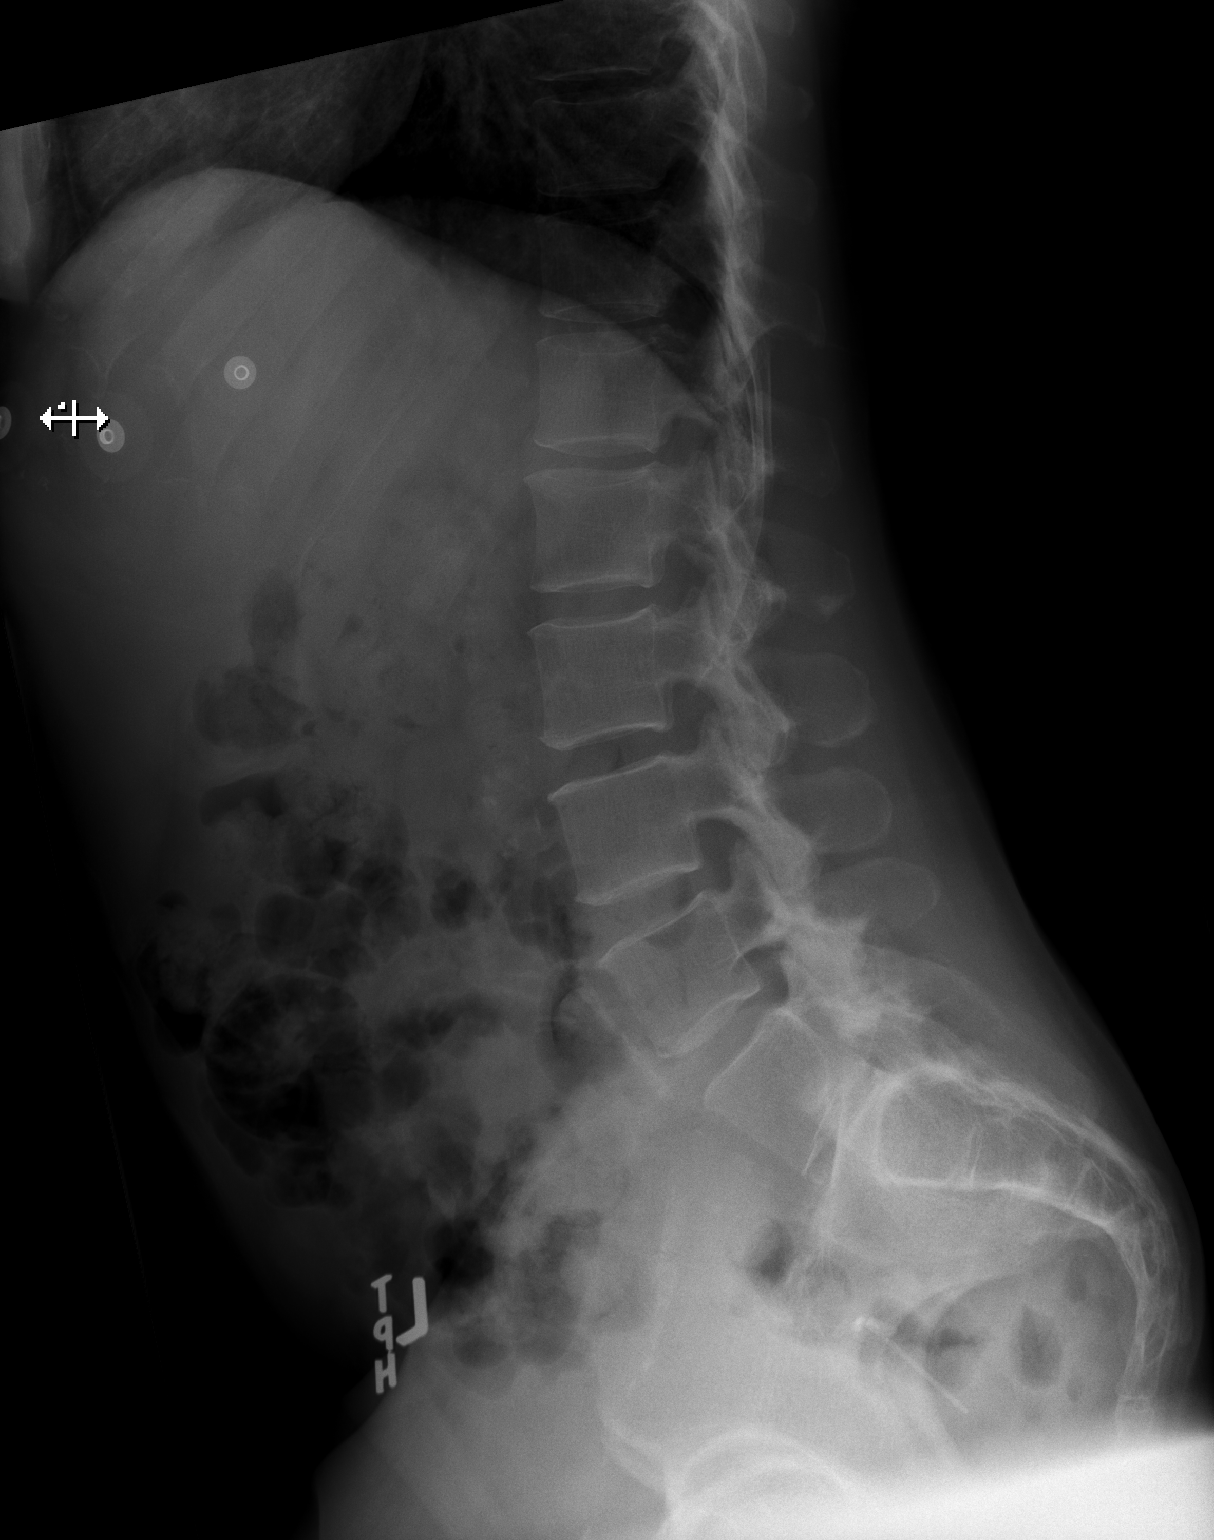

[t lumbar l-5 s-1 spot]
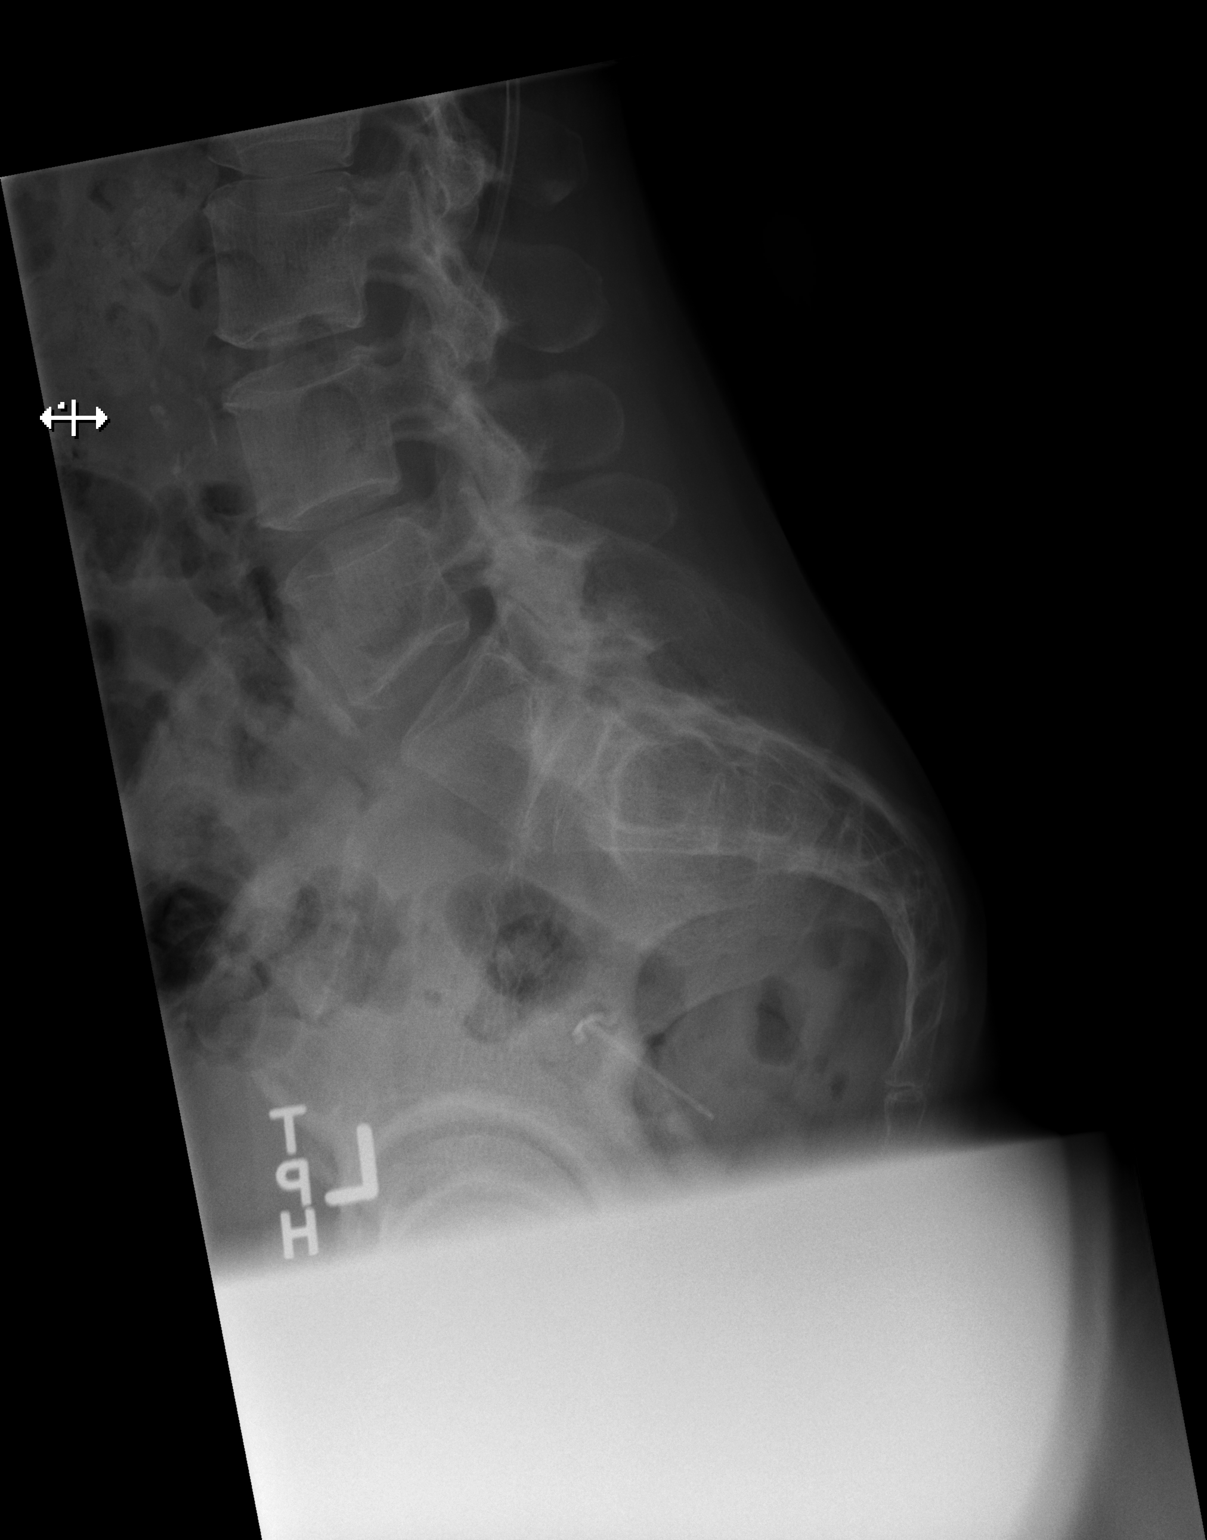

[3 of 3 positions shown; findings below may reference images not displayed]

FINDINGS: There are five non-rib bearing lumbar type vertebral bodies with
lumbarization of the S1 vertebral body.  For the purposes of this
dictation, lumbar vertebral labels will be labeled L1-L5 (note,
this labeling is different than the labeling provided on lumbar
spine MRI performed 10/12/2011).

There is mild scoliotic curvature of the thoracolumbar spine,
convex to the right, possibly positional.  No anterolisthesis or
retrolisthesis.

Lumbar vertebral body heights are preserved.  Intervertebral disc
spaces are preserved.

Limited visualization of the bilateral SI joints and hips is
normal.  An intrauterine device overlies the pelvis.  A phlebolith
overlies the right hemi pelvis.  The regional soft tissues and
bowel gas patterns are normal.
IMPRESSION: 1.  No acute findings.

2. Transitional anatomy as detailed above.

## 2014-10-22 IMAGING — CR DG CERVICAL SPINE 2 OR 3 VIEWS
5 series · 5 of 5 positions shown · non-contrast
Comparison: Cervical spine CT - 03/19/2012

CLINICAL DATA: Post fall, now with lower cervical spine pain

CERVICAL SPINE - 2-3 VIEW

[t cervical spine ap]
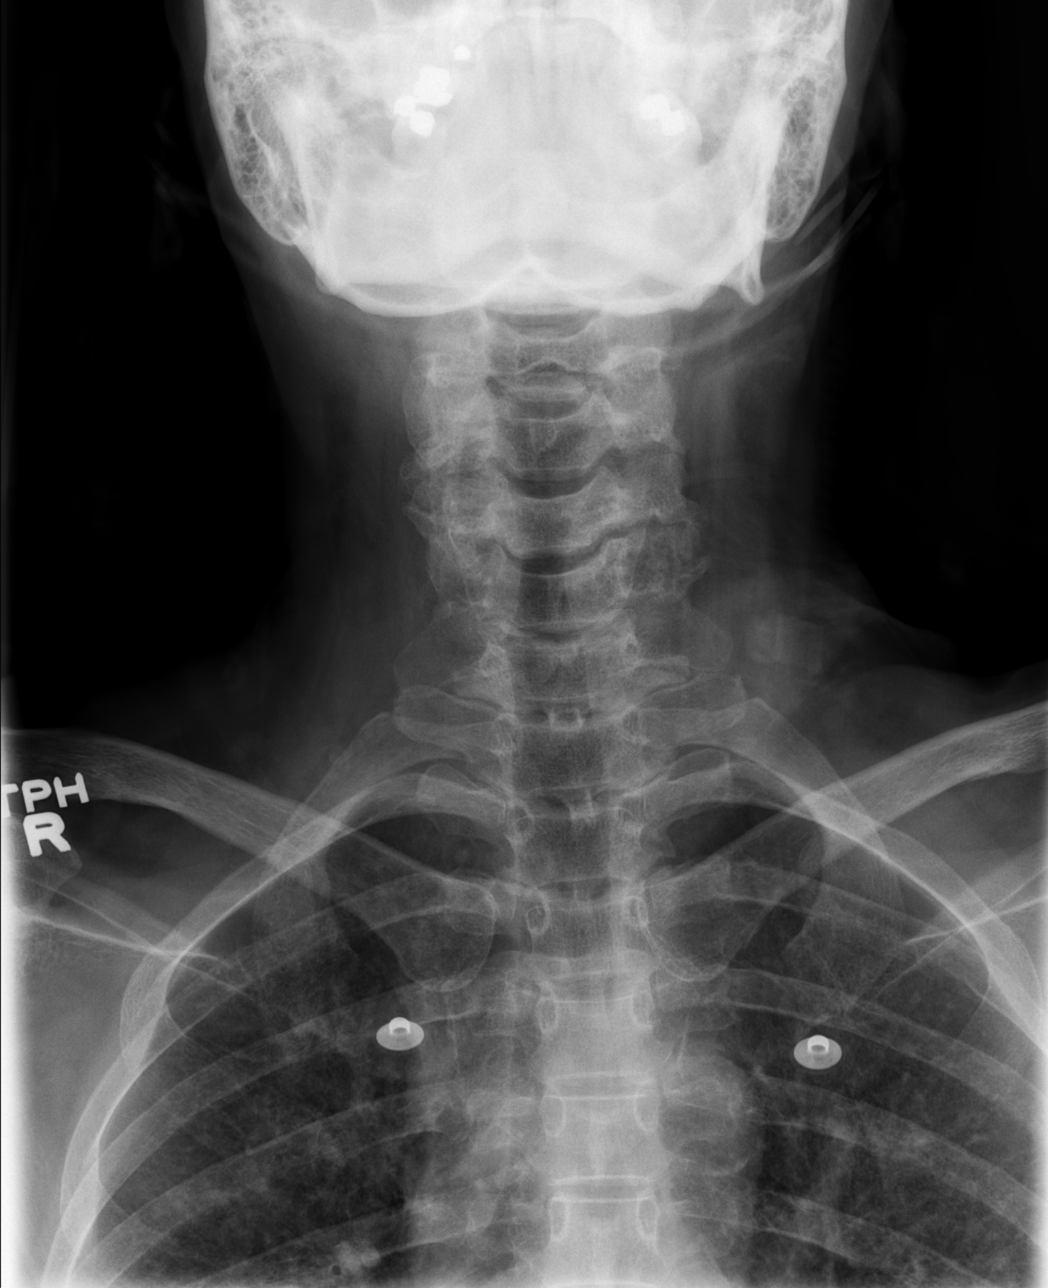

[t cervical spine odontoid (1 of 4)]
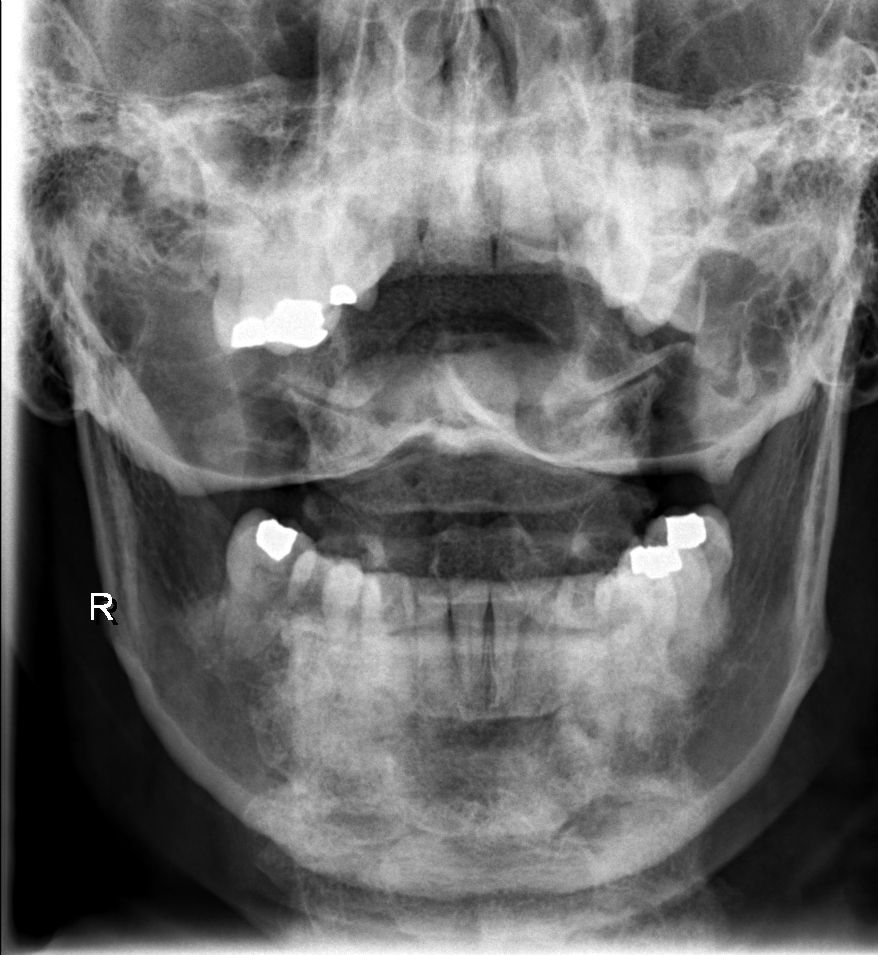

[t cervical spine odontoid (2 of 4)]
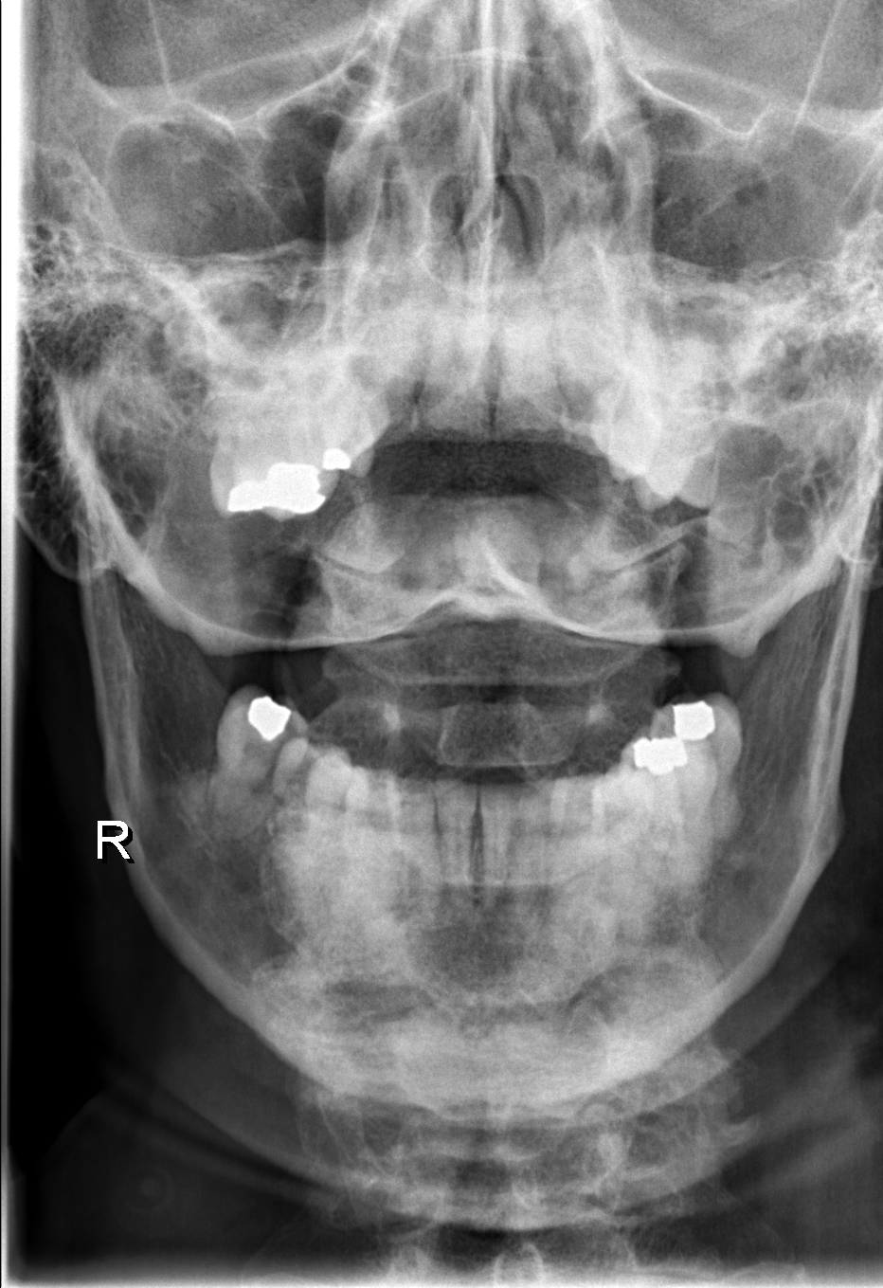

[t cervical spine odontoid (3 of 4)]
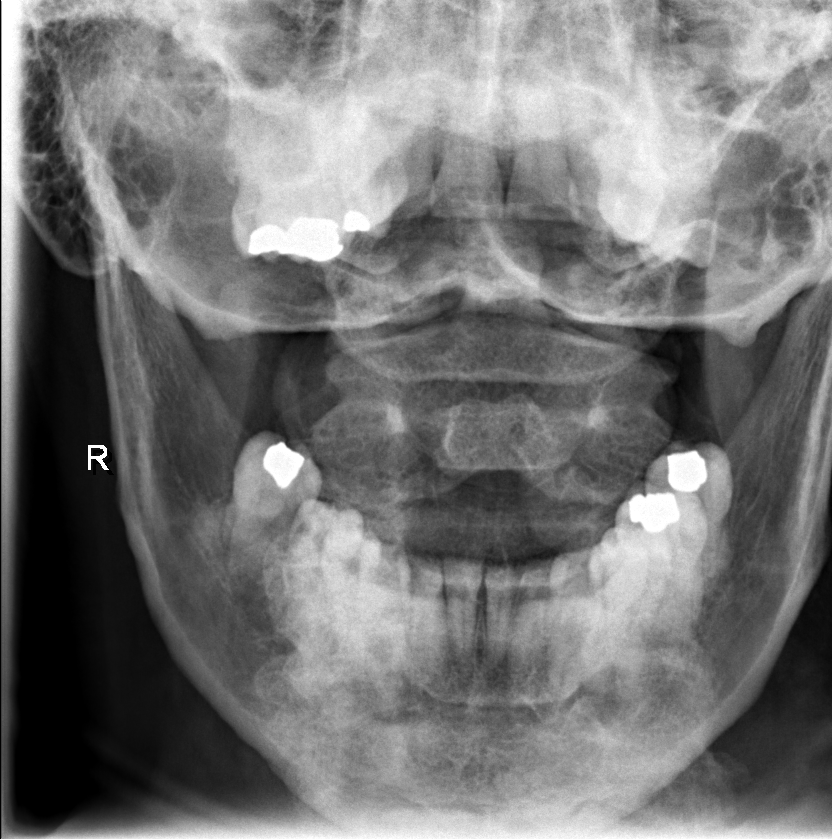

[t cervical spine odontoid (4 of 4)]
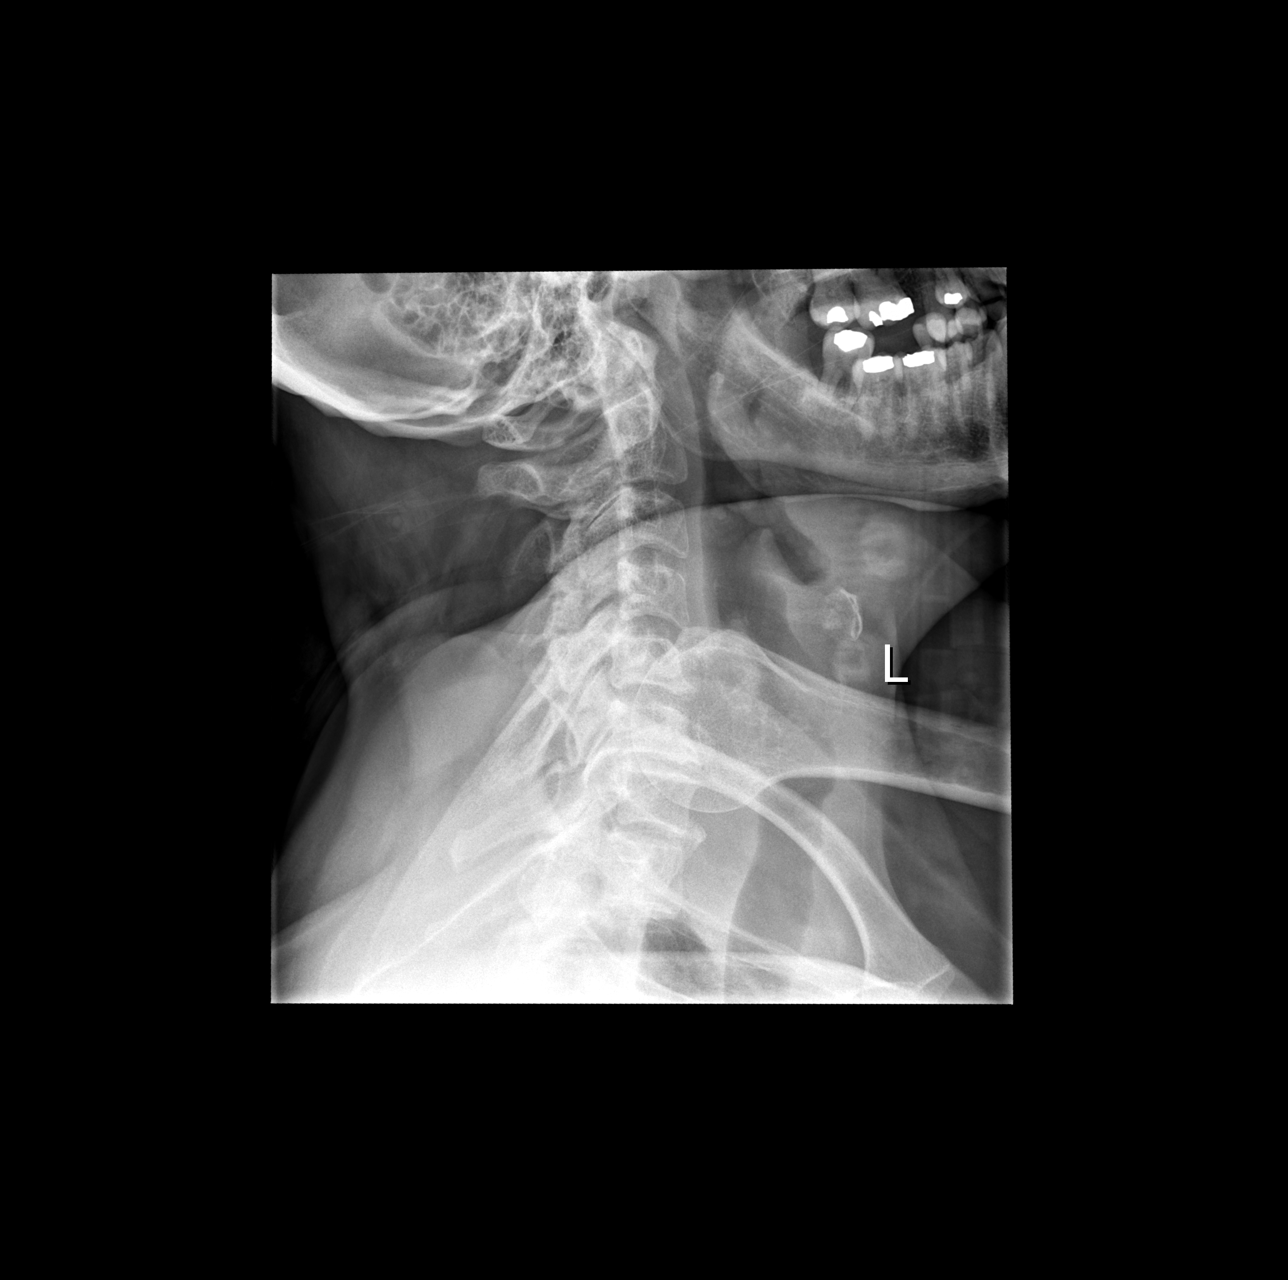

[5 of 5 positions shown; findings below may reference images not displayed]

FINDINGS: C1 to the superior endplate of T1 is visualized on the lateral
radiograph.

There is mild straightening of the expected cervical lordosis.  No
definite anterolisthesis or retrolisthesis.  Evaluation of the
atlantodental articulation is suboptimally evaluated due to
overlying osseous structures.

Cervical vertebral body heights appear preserved.  Prevertebral
soft tissues are normal.

There is grossly unchanged mild to moderate multilevel cervical
spine in DDD, worse at C6 - C7 and to a lesser extent, C5 - C6 with
disc space height loss, end plate irregularity and sclerosis.

Regional soft tissues are normal.  Limited visualization of the
lung apices is normal.
IMPRESSION: 1.  No definite acute findings.
2.  Grossly unchanged mild to moderate multilevel cervical spine
DDD, worse at C6 - C7.

## 2014-10-22 IMAGING — CR DG SACRUM/COCCYX 2+V
3 series · 3 of 3 positions shown · non-contrast
Comparison: Lumbar spine radiographs - earlier same day

CLINICAL DATA: Post fall

SACRUM AND COCCYX - 2+ VIEW

[t sacrum ap]
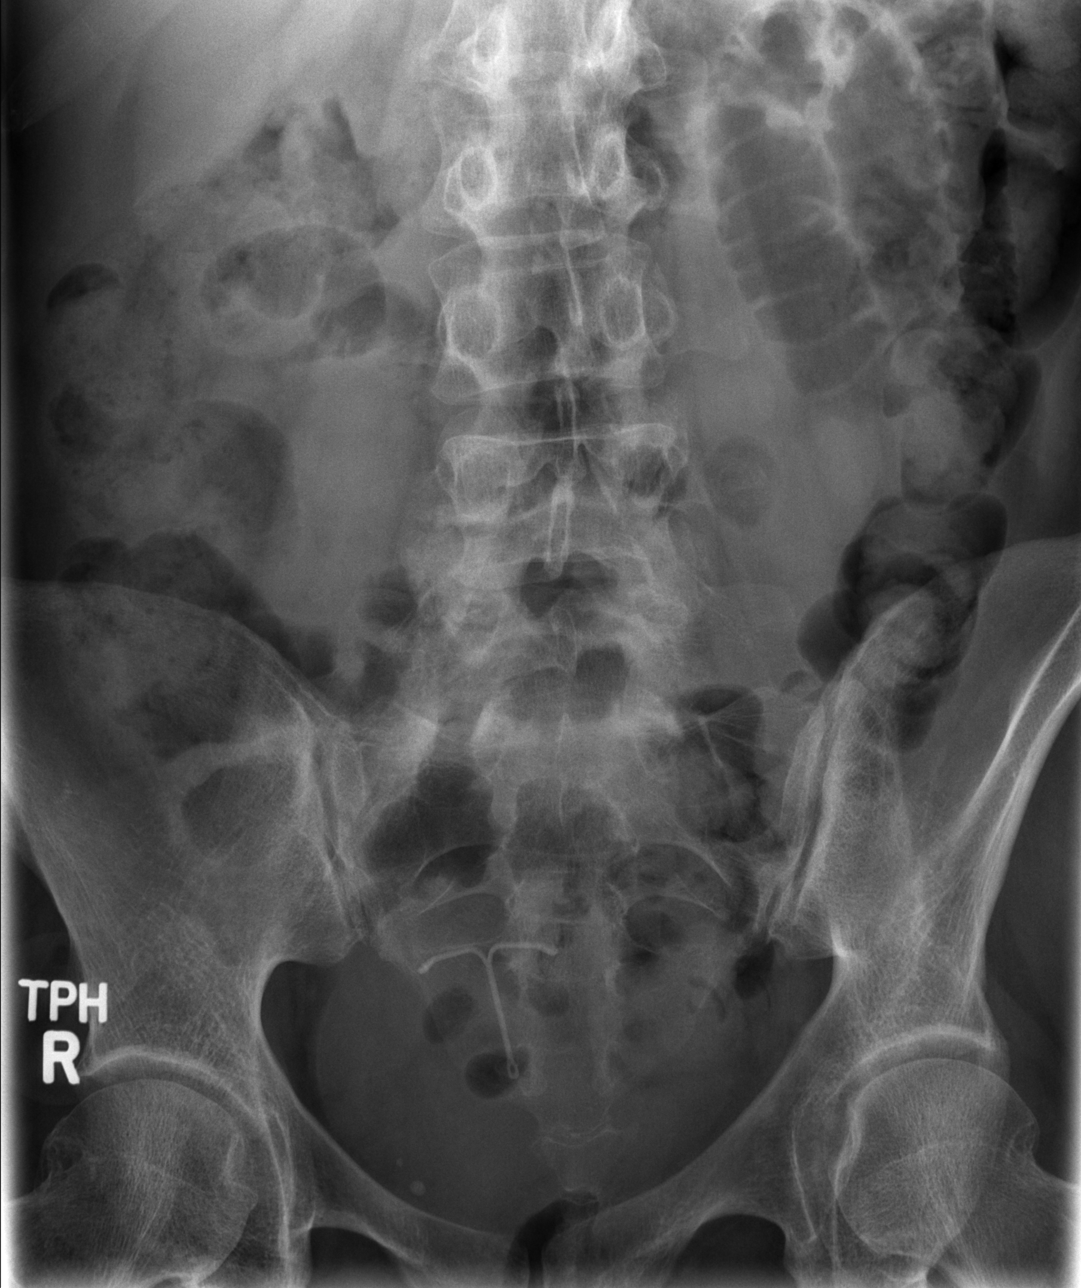

[t coccyx ap]
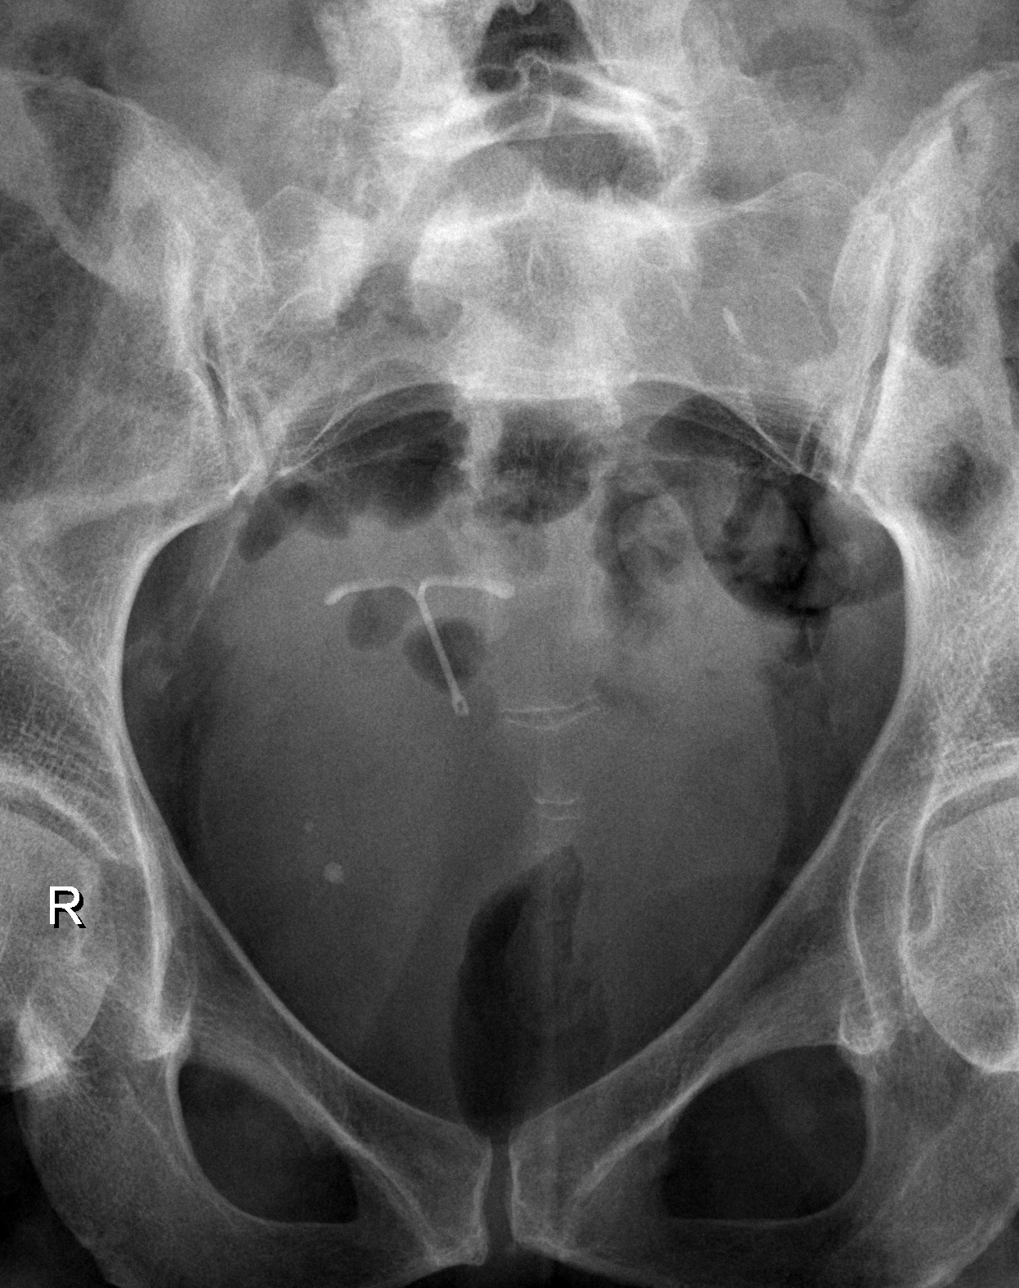

[t sacrum coccyx lat]
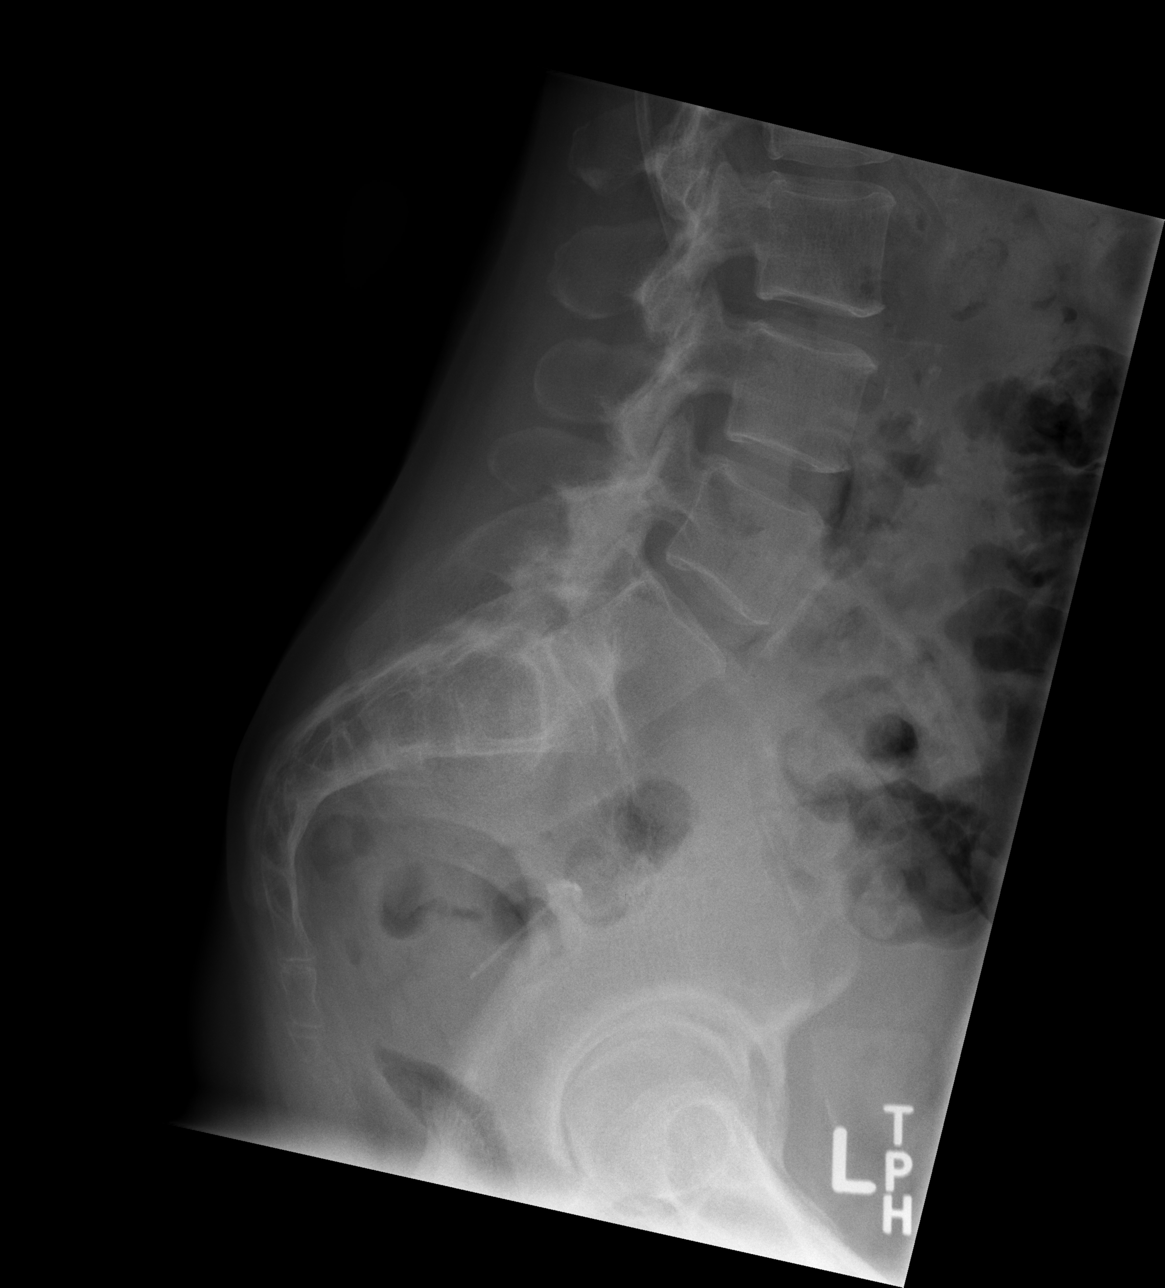

[3 of 3 positions shown; findings below may reference images not displayed]

FINDINGS: No definite displaced sacral or coccygeal fracture.

The pubic symphysis and bilateral SI joint spaces appear normal.

An intrauterine device and phlebolith overlie the pelvis.  Regional
bowel gas pattern and soft tissues are normal.
IMPRESSION: No definite displaced sacral or coccygeal fracture.

## 2014-10-22 IMAGING — CR DG CHEST 1V
1 series · 1 of 1 positions shown · non-contrast
Comparison: 02/14/2011; 07/10/2010; 05/17/2011

CLINICAL DATA: Post fall, now with back pain

CHEST - 1 VIEW

[t chest supine]
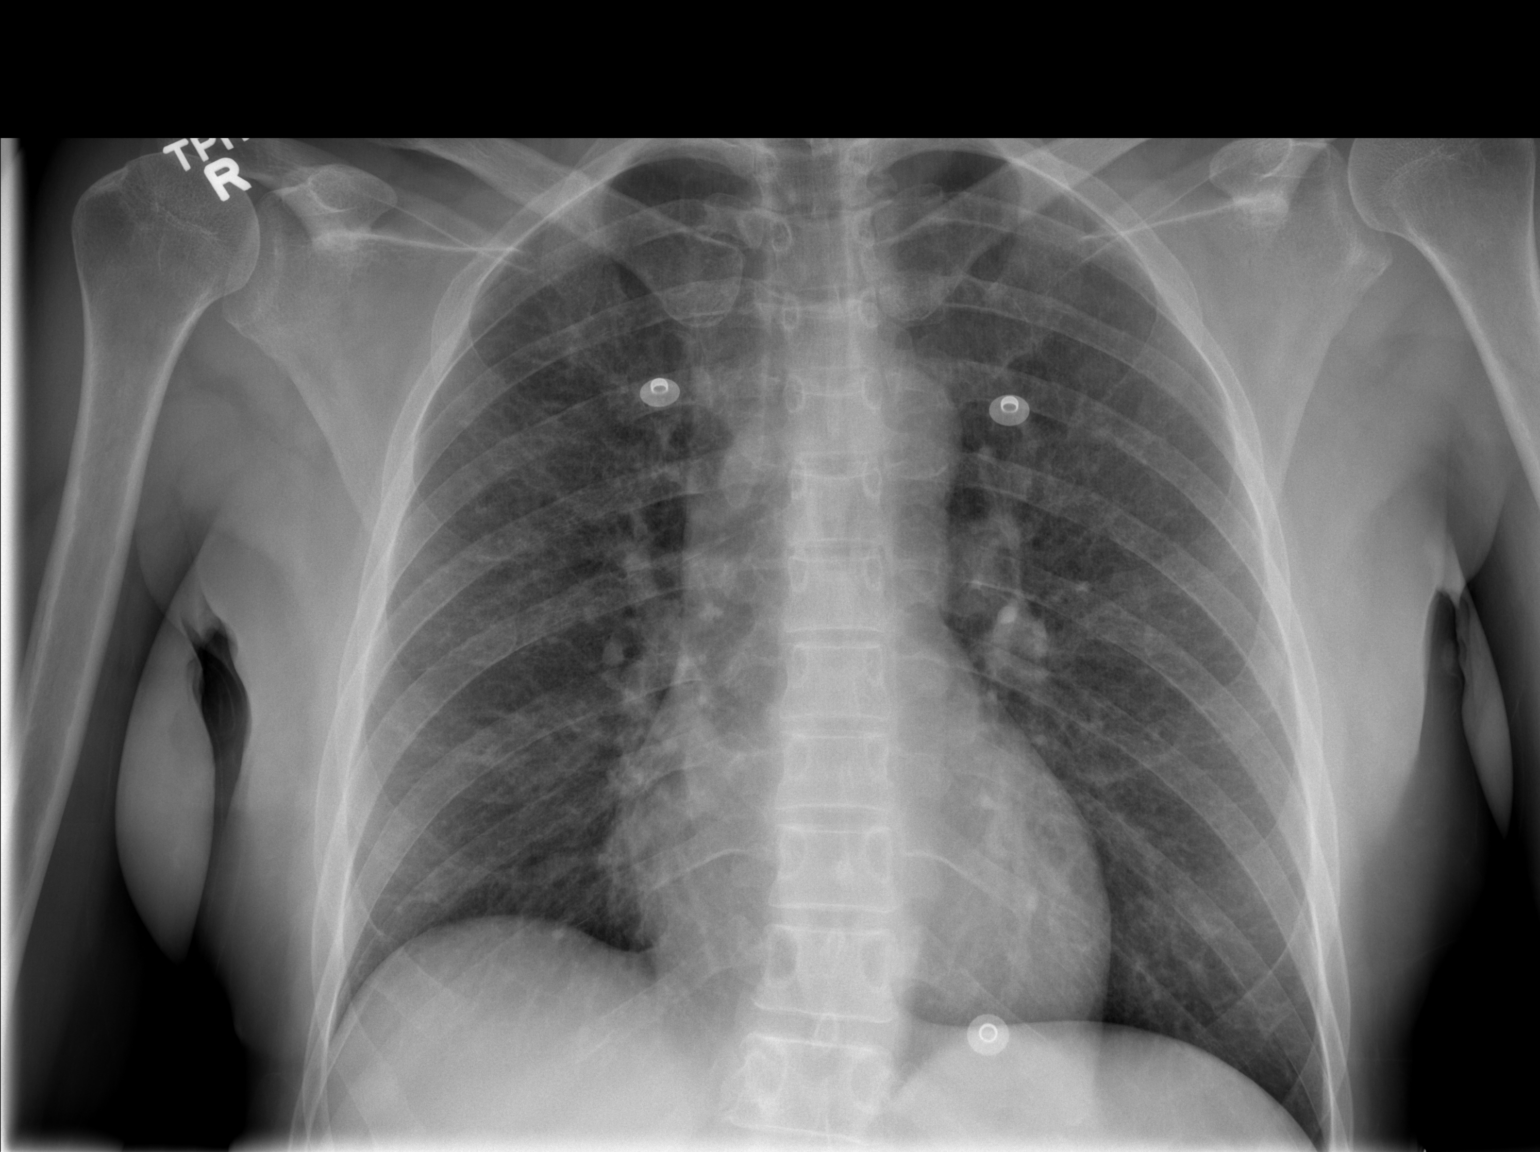

[1 of 1 positions shown; findings below may reference images not displayed]

FINDINGS: Unchanged cardiac silhouette and mediastinal contours.
The lungs appear hyperinflated with flattening of bilateral
hemidiaphragms and mild diffuse thickening of the pulmonary
station.  No new focal airspace opacity.  No definite pleural
effusion or pneumothorax.  Unchanged bones.
IMPRESSION: Mild lung hyperexpansion and bronchitic change without acute
cardiopulmonary disease.

## 2014-11-01 ENCOUNTER — Telehealth: Payer: Self-pay | Admitting: Internal Medicine

## 2014-11-01 NOTE — Telephone Encounter (Signed)
Patient is calling asking for new copies of her medication list and two letters that were given to her earlier in the year with new dates. On 05/08/14 and 08/09/14.

## 2014-11-01 NOTE — Telephone Encounter (Signed)
Talked with pt - pt has appt 11/02/14 3:15PM  with Dr Naaman Plummer - pt needs copy of list of meds, copy of letter from 05/08/14 Dr Naaman Plummer and4/28/16 Dr Hayes Ludwig. Copies will be given to pt at appt time. Hilda Blades Sarahmarie Leavey RN 11/01/14 2:55PM

## 2014-11-02 ENCOUNTER — Ambulatory Visit (INDEPENDENT_AMBULATORY_CARE_PROVIDER_SITE_OTHER): Payer: Self-pay | Admitting: Internal Medicine

## 2014-11-02 ENCOUNTER — Encounter: Payer: Self-pay | Admitting: Internal Medicine

## 2014-11-02 VITALS — BP 162/82 | HR 86 | Temp 98.0°F | Wt 119.1 lb

## 2014-11-02 DIAGNOSIS — M47899 Other spondylosis, site unspecified: Secondary | ICD-10-CM

## 2014-11-02 DIAGNOSIS — Z0000 Encounter for general adult medical examination without abnormal findings: Secondary | ICD-10-CM

## 2014-11-02 DIAGNOSIS — F418 Other specified anxiety disorders: Secondary | ICD-10-CM

## 2014-11-02 DIAGNOSIS — F329 Major depressive disorder, single episode, unspecified: Secondary | ICD-10-CM

## 2014-11-02 DIAGNOSIS — F319 Bipolar disorder, unspecified: Secondary | ICD-10-CM

## 2014-11-02 DIAGNOSIS — E785 Hyperlipidemia, unspecified: Secondary | ICD-10-CM

## 2014-11-02 DIAGNOSIS — F17210 Nicotine dependence, cigarettes, uncomplicated: Secondary | ICD-10-CM

## 2014-11-02 DIAGNOSIS — F32A Depression, unspecified: Secondary | ICD-10-CM

## 2014-11-02 DIAGNOSIS — K589 Irritable bowel syndrome without diarrhea: Secondary | ICD-10-CM

## 2014-11-02 DIAGNOSIS — F419 Anxiety disorder, unspecified: Secondary | ICD-10-CM

## 2014-11-02 DIAGNOSIS — G8929 Other chronic pain: Secondary | ICD-10-CM

## 2014-11-02 DIAGNOSIS — M47819 Spondylosis without myelopathy or radiculopathy, site unspecified: Secondary | ICD-10-CM

## 2014-11-02 DIAGNOSIS — R911 Solitary pulmonary nodule: Secondary | ICD-10-CM

## 2014-11-02 DIAGNOSIS — M47896 Other spondylosis, lumbar region: Secondary | ICD-10-CM

## 2014-11-02 DIAGNOSIS — K219 Gastro-esophageal reflux disease without esophagitis: Secondary | ICD-10-CM

## 2014-11-02 DIAGNOSIS — R7303 Prediabetes: Secondary | ICD-10-CM

## 2014-11-02 DIAGNOSIS — I10 Essential (primary) hypertension: Secondary | ICD-10-CM

## 2014-11-02 DIAGNOSIS — R7309 Other abnormal glucose: Secondary | ICD-10-CM

## 2014-11-02 DIAGNOSIS — M47816 Spondylosis without myelopathy or radiculopathy, lumbar region: Secondary | ICD-10-CM

## 2014-11-02 MED ORDER — METHOCARBAMOL 500 MG PO TABS: 500.0000 mg | ORAL_TABLET | Freq: Four times a day (QID) | ORAL | Status: DC | PRN

## 2014-11-02 MED ORDER — METOPROLOL TARTRATE 25 MG PO TABS: 25.0000 mg | ORAL_TABLET | Freq: Two times a day (BID) | ORAL | Status: DC

## 2014-11-02 MED ORDER — AMLODIPINE BESYLATE 10 MG PO TABS: 10.0000 mg | ORAL_TABLET | Freq: Every day | ORAL | Status: DC

## 2014-11-02 MED ORDER — DIVALPROEX SODIUM 250 MG PO DR TAB: 500.0000 mg | DELAYED_RELEASE_TABLET | Freq: Two times a day (BID) | ORAL | Status: DC

## 2014-11-02 MED ORDER — TRAMADOL HCL 50 MG PO TABS: 50.0000 mg | ORAL_TABLET | Freq: Four times a day (QID) | ORAL | Status: DC | PRN

## 2014-11-02 MED ORDER — RANITIDINE HCL 150 MG PO CAPS: 150.0000 mg | ORAL_CAPSULE | Freq: Two times a day (BID) | ORAL | Status: DC

## 2014-11-02 MED ORDER — LISINOPRIL 20 MG PO TABS: 20.0000 mg | ORAL_TABLET | Freq: Every day | ORAL | Status: DC

## 2014-11-02 MED ORDER — SERTRALINE HCL 100 MG PO TABS: 100.0000 mg | ORAL_TABLET | Freq: Every day | ORAL | Status: DC

## 2014-11-02 MED ORDER — GABAPENTIN 300 MG PO CAPS
600.0000 mg | ORAL_CAPSULE | Freq: Three times a day (TID) | ORAL | Status: DC
Start: 2014-11-02 — End: 2015-03-15

## 2014-11-02 MED ORDER — SIMVASTATIN 20 MG PO TABS
20.0000 mg | ORAL_TABLET | Freq: Every evening | ORAL | Status: DC
Start: 2014-11-02 — End: 2015-03-15

## 2014-11-02 MED ORDER — KETOROLAC TROMETHAMINE 30 MG/ML IJ SOLN
60.0000 mg | Freq: Once | INTRAMUSCULAR | Status: AC
  Administered 2014-11-02: 60 mg via INTRAMUSCULAR

## 2014-11-02 MED ORDER — DICLOFENAC SODIUM 1 % TD GEL: 1.0000 "application " | Freq: Four times a day (QID) | TRANSDERMAL | Status: DC | PRN

## 2014-11-02 MED ORDER — DICYCLOMINE HCL 20 MG PO TABS: 20.0000 mg | ORAL_TABLET | Freq: Three times a day (TID) | ORAL | Status: DC
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
## 2014-11-02 NOTE — Telephone Encounter (Signed)
Ms. Laatsch left messages with CSW regarding same request.  Pt left listing read off all diagnosis that she is requesting to be documented and provided for today's appointment.  CSW will provide pt with copy of med list, dx list and letters from [facility].  CSW left message with Ms. Mariner all items will be in envelope with nurse to provide during appointment.

## 2014-11-02 NOTE — Patient Instructions (Signed)
-  Start taking lisinopril 20 mg daily instead of 10 mg daily  -Start taking zoloft 100 mg daily instead of 75 mg daily -I refilled all your medications -Please come back after you see the surgeon, very nice seeing you again!   General Instructions:   Please bring your medicines with you each time you come to clinic.  Medicines may include prescription medications, over-the-counter medications, herbal remedies, eye drops, vitamins, or other pills.   Progress Toward Treatment Goals:  Treatment Goal 03/19/2014  Blood pressure deteriorated  Stop smoking smoking the same amount  Prevent falls improved    Self Care Goals & Plans:  Self Care Goal 05/30/2014  Manage my medications take my medicines as prescribed; refill my medications on time  Monitor my health keep track of my blood pressure  Eat healthy foods (No Data)  Be physically active find an activity I enjoy  Stop smoking -  Prevent falls -  Meeting treatment goals -    No flowsheet data found.   Care Management & Community Referrals:  Referral 03/19/2014  Referrals made for care management support none needed  Referrals made to community resources -

## 2014-11-03 DIAGNOSIS — R7303 Prediabetes: Secondary | ICD-10-CM | POA: Insufficient documentation

## 2014-11-03 NOTE — Assessment & Plan Note (Addendum)
Assessment: Pt with CAD with last lipid panel on 12/09/12 with LDL of 78 compliant with moderate-intensity statin therapy with 10-year ASCVD calculated risk near 7.5% with recommendations to continue moderate to high intensity statin therapy.  Plan:  -Refill simvastatin 20 mg daily  -Last CMP on 07/06/14 with normal liver function -Obtain A1c at next visit   -Continue to monitor for myalgias

## 2014-11-03 NOTE — Assessment & Plan Note (Signed)
Pt to be scheduled for mammogram that was previously ordered

## 2014-11-03 NOTE — Assessment & Plan Note (Addendum)
Assessment: Pt with prediabetes with last A1c of 5.7 on 12/09/12 who presents with no symptoms of hyper/hypoglycemia.   Plan:  -Obtain A1c at next visit  -Encourage lifestyle modification

## 2014-11-03 NOTE — Assessment & Plan Note (Signed)
Assessment: Pt with moderately well-controlled compliant with three-class (CCB, ACEi, and BB) anti-hypertensive therapy who presents with blood pressure of 162/82.   Plan:  -BP 162/82 not at goal <140/90 -Increase lisinopril from 10 mg daily to 20 mg daily -Refill amlodipine 10 mg daily and lopressor 25 mg BID  -Last CMP on 07/06/14 was nomal

## 2014-11-03 NOTE — Assessment & Plan Note (Signed)
Assessment: Pt with history of anxiety and depression compliant with SSRI therapy who presents with moderately controlled symptoms without suicide ideation in setting of chronic pain.    Plan:  -Increase zoloft from 75 mg daily to 100 mg daily  -Pt refuses to follow with mental health services

## 2014-11-03 NOTE — Assessment & Plan Note (Signed)
Assessment: Pt with diarrhea-predominant IBS compliant with anti-spasmodic therapy with last colonoscopy in 2013 who presents with no alarm symptoms.   Plan:  -Refill bentyl 20 mg TID before meals and at bedtime  -Continue to monitor for alarm symptoms

## 2014-11-03 NOTE — Assessment & Plan Note (Signed)
Assessment: Pt with GERD compliant with H2-blocking therapy who presents with no alarm symptoms.  Plan:  -Refill zantac 150 mg BID -Monitor for alarm symptoms warranting EGD

## 2014-11-03 NOTE — Assessment & Plan Note (Signed)
Assessment: Pt with chronic low back pain with last  MRI on 04/20/14 revealing persistent advanced facet arthropathy of lower lumbar spine with associated marrow edema and possible right L4 nerve root impingement who presents with moderately well-controlled pain on current pain regimen with no alarm symptoms.   Plan:  -Pt given IM Toradol 60 mg x 1 dose today  -Refill gabapentin 600 mg TID, tramadol 50 mg Q 6 hr PRN pain, robaxin 500 mg Q 6 hr PRN muscle spasms, and voltaren 1% gel QID PRN pain -Continue ibuprofen 800 Q 6 hr PRN pain/inflammation -Pt no longer follows with PM&R (Dr. Letta Pate) and is not candidate for narcotics from our clinic due to past history  -Pt to be seen by neurosurgery at [facility] once acquires radiographic images, pt given information on where to obtain these

## 2014-11-03 NOTE — Assessment & Plan Note (Signed)
Assessment: Pt with bipolar 1 disorder compliant with mood stabilizing therapy who presents with no manic episodes.   Pan:  -Refill depakote 500 mg BID -Pt refuses to follow with mental health services

## 2014-11-03 NOTE — Assessment & Plan Note (Signed)
Assessment: Pt is a chronic smoker with evidence of 11 mm LLL solitary pulmonary nodule incidentally found on CT abdomen/pelvis on 10/25/12 with no subsequent recommended 1-year follow-up   Plan:  -Order CT chest w/o contrast

## 2014-11-04 NOTE — Progress Notes (Signed)
Medicine attending: Medical history, presenting problems, physical findings, and medications, reviewed with Dr Marjan Rabbani on the day of the patient visit and I concur with her evaluation and management plan. 

## 2014-11-09 ENCOUNTER — Other Ambulatory Visit: Payer: Self-pay | Admitting: *Deleted

## 2014-11-09 ENCOUNTER — Other Ambulatory Visit: Payer: Self-pay | Admitting: Internal Medicine

## 2014-11-09 DIAGNOSIS — K219 Gastro-esophageal reflux disease without esophagitis: Secondary | ICD-10-CM

## 2014-11-09 MED ORDER — FAMOTIDINE 20 MG PO TABS: 20.0000 mg | ORAL_TABLET | Freq: Two times a day (BID) | ORAL | Status: DC

## 2014-11-09 NOTE — Telephone Encounter (Signed)
Pharmacy hs Famotidine available instead of Zantac, will you consider changing?  Also [facility] can not get Voltaren Gel.

## 2014-11-13 ENCOUNTER — Telehealth: Payer: Self-pay | Admitting: *Deleted

## 2014-11-13 NOTE — Telephone Encounter (Signed)
[facility] pharm tech calls and states they are destroying the script for voltaren gel

## 2014-11-20 ENCOUNTER — Ambulatory Visit (HOSPITAL_COMMUNITY): Payer: Self-pay

## 2014-11-20 ENCOUNTER — Telehealth: Payer: Self-pay | Admitting: Student in an Organized Health Care Education/Training Program

## 2014-11-20 ENCOUNTER — Ambulatory Visit (HOSPITAL_COMMUNITY)
Admission: RE | Admit: 2014-11-20 | Discharge: 2014-11-20 | Disposition: A | Discharge status: Home / Self Care | Payer: No Typology Code available for payment source | Source: Ambulatory Visit | Attending: Internal Medicine | Admitting: Internal Medicine

## 2014-11-20 DIAGNOSIS — R911 Solitary pulmonary nodule: Secondary | ICD-10-CM

## 2014-11-20 DIAGNOSIS — I2510 Atherosclerotic heart disease of native coronary artery without angina pectoris: Secondary | ICD-10-CM | POA: Insufficient documentation

## 2014-11-20 DIAGNOSIS — J439 Emphysema, unspecified: Secondary | ICD-10-CM | POA: Insufficient documentation

## 2014-11-20 NOTE — Addendum Note (Signed)
Addended by: Larey Dresser A on: 11/20/2014 11:15 AM   Modules accepted: Orders

## 2014-11-20 NOTE — Telephone Encounter (Signed)
Called patient with result of follow up CT chest. She has had a 1cm ground glass LLL pulmonary nodule since 2012. According to radiology, it appears to be slightly enlarged at this point with characteristics of bronchogenic malignancy.  They recommend follow up with a PET CT, which I have ordered. I also placed a referral to pulmonology to help interpret these results, consider her risk for lung cancer given tobacco risk factor, and discuss with the patient if a biopsy is needed. The patient understands the follow up plan including the PET CT and subspecialty referral.

## 2014-11-26 NOTE — Telephone Encounter (Signed)
Thank you for your help!  Dr. Naaman Plummer

## 2014-11-29 ENCOUNTER — Telehealth: Payer: Self-pay | Admitting: Licensed Clinical Social Worker

## 2014-11-29 NOTE — Telephone Encounter (Signed)
CSW placed call to Brenda [name] to notify pt of scheduled PET scan on 12/07/14 @ [facility] 8:30am arrival, nothing to eat or drink 6 hrs prior.  CSW left message requesting return call.
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
## 2014-12-03 NOTE — Telephone Encounter (Signed)
Brenda [name] had left several messages for CSW and triage noting she could not make original scheduled PET scan due to conflict with Disability appointment.  CSW deferred to nursing.  CSW received notification pt has been rescheduled for both PET scan and Pulmonologist.  CSW placed call to Brenda [name].  Pt notified of both new appointment dates and times, August calendar mailed to Brenda [name].  Pt denies add'l social work needs at this time.

## 2014-12-07 ENCOUNTER — Encounter (HOSPITAL_COMMUNITY): Payer: No Typology Code available for payment source

## 2014-12-10 ENCOUNTER — Institutional Professional Consult (permissible substitution): Payer: Self-pay | Admitting: Pulmonary Disease

## 2014-12-11 ENCOUNTER — Encounter (HOSPITAL_COMMUNITY)
Admission: RE | Admit: 2014-12-11 | Discharge: 2014-12-11 | Disposition: A | Discharge status: Home / Self Care | Payer: No Typology Code available for payment source | Source: Ambulatory Visit | Attending: Student in an Organized Health Care Education/Training Program | Admitting: Student in an Organized Health Care Education/Training Program

## 2014-12-11 DIAGNOSIS — R911 Solitary pulmonary nodule: Secondary | ICD-10-CM | POA: Insufficient documentation

## 2014-12-11 MED ORDER — FLUDEOXYGLUCOSE F - 18 (FDG) INJECTION
5.8000 | Freq: Once | INTRAVENOUS | Status: DC | PRN
  Administered 2014-12-11: 5.8 via INTRAVENOUS
  Filled 2014-12-11: qty 5.8

## 2014-12-12 ENCOUNTER — Encounter: Payer: Self-pay | Admitting: Pulmonary Disease

## 2014-12-12 ENCOUNTER — Ambulatory Visit (INDEPENDENT_AMBULATORY_CARE_PROVIDER_SITE_OTHER): Payer: No Typology Code available for payment source | Admitting: Pulmonary Disease

## 2014-12-12 VITALS — BP 126/66 | HR 74 | Ht 63.5 in | Wt 121.0 lb

## 2014-12-12 DIAGNOSIS — R911 Solitary pulmonary nodule: Secondary | ICD-10-CM

## 2014-12-12 LAB — GLUCOSE, CAPILLARY: Glucose-Capillary: 103 mg/dL — ABNORMAL HIGH (ref 65–99)

## 2014-12-12 NOTE — Patient Instructions (Signed)
I am going to discuss this case with my colleagues in radiology and thoracic surgery and then will let you know what our plan will be I'll plan on seeing you back in 3 months or sooner if needed

## 2014-12-12 NOTE — Assessment & Plan Note (Signed)
Mrs. Brenda [name] has had a pulmonary nodule dating back to 2009 after uptake time to review multiple CT scans with her today in clinic. It appears to me that it was first seen in 2009 in a CT scan of her abdomen. It was small and groundglass at that time approximately 6 mm. It has grown slowly during that time. I'm not convinced that there is been much growth in the last 2 years but she is at high risk for having lung cancer considering her lengthy smoking history. Further, the fact that it was negative in terms of FDG uptake is not very helpful because his groundglass as this could still be low-grade adenocarcinoma.  After talking to my radiology colleagues at length he also went through and evaluated the nodule, I think the best approach is to refer her to thoracic surgery for consideration of resection of this lesion.  Plan: Thoracic surgery referral for resection.
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
## 2014-12-13 ENCOUNTER — Telehealth: Payer: Self-pay | Admitting: Pulmonary Disease

## 2014-12-13 DIAGNOSIS — R911 Solitary pulmonary nodule: Secondary | ICD-10-CM

## 2014-12-13 NOTE — Telephone Encounter (Signed)
Called and LMTCB x1 

## 2014-12-14 ENCOUNTER — Telehealth: Payer: Self-pay | Admitting: Licensed Clinical Social Worker

## 2014-12-14 NOTE — Telephone Encounter (Signed)
lmtcb X1 with unnamed man for pt to call back.

## 2014-12-14 NOTE — Telephone Encounter (Signed)
Pt cb,

## 2014-12-14 NOTE — Telephone Encounter (Signed)
[phone] Tennis Must)  Patient says okay to leave details with Mr. Theda Sers since she does not have a phone right now. Patient says that Dr. Lake Bells called her to schedule surgery, she wants to know when surgery is scheduled.  I do not see it in chart.  Dr. Lake Bells, please advise.  (Patient says that you can call Tennis Must at anytime of the day or night with information)

## 2014-12-14 NOTE — Telephone Encounter (Signed)
She will get a call from Thoracic surgery to schedule a clinic visit, then they will decide on surgery

## 2014-12-14 NOTE — Telephone Encounter (Signed)
Ms. Ewy placed call and left message with CSW.  Pt requesting work excuse letter for the month of September.  Pt states she has a scheduled surgery.  Ms. Portner would like the letter mailed to her home address.

## 2014-12-18 ENCOUNTER — Encounter: Payer: Self-pay | Admitting: Internal Medicine

## 2014-12-18 NOTE — Telephone Encounter (Signed)
Left message and number given for pt to call back

## 2014-12-18 NOTE — Telephone Encounter (Signed)
I wrote it today and will send out, thanks!  Dr. Naaman Plummer

## 2014-12-19 NOTE — Telephone Encounter (Signed)
lmomtcb for pt 

## 2014-12-19 NOTE — Telephone Encounter (Signed)
Called and spoke to pt's friend, Richard. Richard will give message to pt to return our call. Will await call from pt.

## 2014-12-19 NOTE — Telephone Encounter (Signed)
Spoke with pt.  Discussed below per Dr. Lake Bells.  Pt verbalized understanding.  Pt states she has not yet received a call to schedule appt with thoracic surgery.  I placed referral for this.  Pt is aware she will receive a call for an appt and is to call office back if she has not received appt in the next few days.  Pt verbalized understanding, is in agreement with plan, and voiced no further questions or concerns at this time.

## 2014-12-19 NOTE — Telephone Encounter (Signed)
Pt returned call  [phone]

## 2014-12-19 NOTE — Telephone Encounter (Signed)
Pt will call back or [phone] calling back

## 2014-12-25 ENCOUNTER — Institutional Professional Consult (permissible substitution) (INDEPENDENT_AMBULATORY_CARE_PROVIDER_SITE_OTHER): Payer: No Typology Code available for payment source | Admitting: Thoracic Surgery (Cardiothoracic Vascular Surgery)

## 2014-12-25 ENCOUNTER — Encounter: Payer: Self-pay | Admitting: Thoracic Surgery (Cardiothoracic Vascular Surgery)

## 2014-12-25 VITALS — BP 179/91 | HR 64 | Resp 20 | Ht 63.5 in | Wt 121.0 lb

## 2014-12-25 DIAGNOSIS — Z720 Tobacco use: Secondary | ICD-10-CM

## 2014-12-25 DIAGNOSIS — J432 Centrilobular emphysema: Secondary | ICD-10-CM

## 2014-12-25 DIAGNOSIS — G8929 Other chronic pain: Secondary | ICD-10-CM

## 2014-12-25 DIAGNOSIS — I25119 Atherosclerotic heart disease of native coronary artery with unspecified angina pectoris: Secondary | ICD-10-CM

## 2014-12-25 DIAGNOSIS — R911 Solitary pulmonary nodule: Secondary | ICD-10-CM
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
## 2015-01-02 ENCOUNTER — Telehealth: Payer: Self-pay | Admitting: Licensed Clinical Social Worker

## 2015-01-02 NOTE — Telephone Encounter (Signed)
I am not certain even if the diagnosis of lung cancer has been made at this point and don't feel comfortable writing this letter. Please let her know to contact her pulmonologist regarding this. I did resolve the polysubstance abuse on her problem list. Thanks!   Dr. Naaman Plummer

## 2015-01-02 NOTE — Telephone Encounter (Signed)
Brenda [name] placed call to CSW requesting letter from PCP.  Pt states "I have cancer and I need an updated letter for me to get Medicaid."  Brenda [name] requesting letter to provide to her disability advocate and the Medicaid Eligibility office.  Pt requesting letter to be mailed to her home address.  In addition, pt requesting the diagnosis of polysubstance abuse to be removed from her medical record, stating "I don't know where they go that from."  Pt's message ran out of space on CSW voicemail.  This request will be forwarded to PCP.

## 2015-01-03 ENCOUNTER — Other Ambulatory Visit: Payer: Self-pay | Admitting: Licensed Clinical Social Worker

## 2015-01-03 ENCOUNTER — Encounter (HOSPITAL_COMMUNITY): Payer: Self-pay | Admitting: *Deleted

## 2015-01-03 ENCOUNTER — Emergency Department (HOSPITAL_COMMUNITY)
Admission: EM | Admit: 2015-01-03 | Discharge: 2015-01-03 | Disposition: A | Discharge status: Home / Self Care | Payer: No Typology Code available for payment source | Attending: Emergency Medicine | Admitting: Emergency Medicine

## 2015-01-03 DIAGNOSIS — N179 Acute kidney failure, unspecified: Secondary | ICD-10-CM | POA: Insufficient documentation

## 2015-01-03 DIAGNOSIS — Z87440 Personal history of urinary (tract) infections: Secondary | ICD-10-CM | POA: Insufficient documentation

## 2015-01-03 DIAGNOSIS — I252 Old myocardial infarction: Secondary | ICD-10-CM | POA: Insufficient documentation

## 2015-01-03 DIAGNOSIS — Z862 Personal history of diseases of the blood and blood-forming organs and certain disorders involving the immune mechanism: Secondary | ICD-10-CM | POA: Insufficient documentation

## 2015-01-03 DIAGNOSIS — M7918 Myalgia, other site: Secondary | ICD-10-CM

## 2015-01-03 DIAGNOSIS — Z7982 Long term (current) use of aspirin: Secondary | ICD-10-CM | POA: Insufficient documentation

## 2015-01-03 DIAGNOSIS — F319 Bipolar disorder, unspecified: Secondary | ICD-10-CM | POA: Insufficient documentation

## 2015-01-03 DIAGNOSIS — Z720 Tobacco use: Secondary | ICD-10-CM | POA: Insufficient documentation

## 2015-01-03 DIAGNOSIS — I509 Heart failure, unspecified: Secondary | ICD-10-CM | POA: Insufficient documentation

## 2015-01-03 DIAGNOSIS — E785 Hyperlipidemia, unspecified: Secondary | ICD-10-CM | POA: Insufficient documentation

## 2015-01-03 DIAGNOSIS — Z8673 Personal history of transient ischemic attack (TIA), and cerebral infarction without residual deficits: Secondary | ICD-10-CM | POA: Insufficient documentation

## 2015-01-03 DIAGNOSIS — F419 Anxiety disorder, unspecified: Secondary | ICD-10-CM | POA: Insufficient documentation

## 2015-01-03 DIAGNOSIS — I10 Essential (primary) hypertension: Secondary | ICD-10-CM | POA: Insufficient documentation

## 2015-01-03 DIAGNOSIS — M791 Myalgia: Secondary | ICD-10-CM | POA: Insufficient documentation

## 2015-01-03 DIAGNOSIS — I2510 Atherosclerotic heart disease of native coronary artery without angina pectoris: Secondary | ICD-10-CM | POA: Insufficient documentation

## 2015-01-03 DIAGNOSIS — Z8701 Personal history of pneumonia (recurrent): Secondary | ICD-10-CM | POA: Insufficient documentation

## 2015-01-03 DIAGNOSIS — Z79899 Other long term (current) drug therapy: Secondary | ICD-10-CM | POA: Insufficient documentation

## 2015-01-03 IMAGING — CR DG CHEST 2V
2 series · 2 of 2 positions shown · non-contrast
Comparison: Chest x-ray 05/26/2012.

CLINICAL DATA: Leg swelling.  Shortness of breath.  Chest pain.

CHEST - 2 VIEW

[w chest pa]
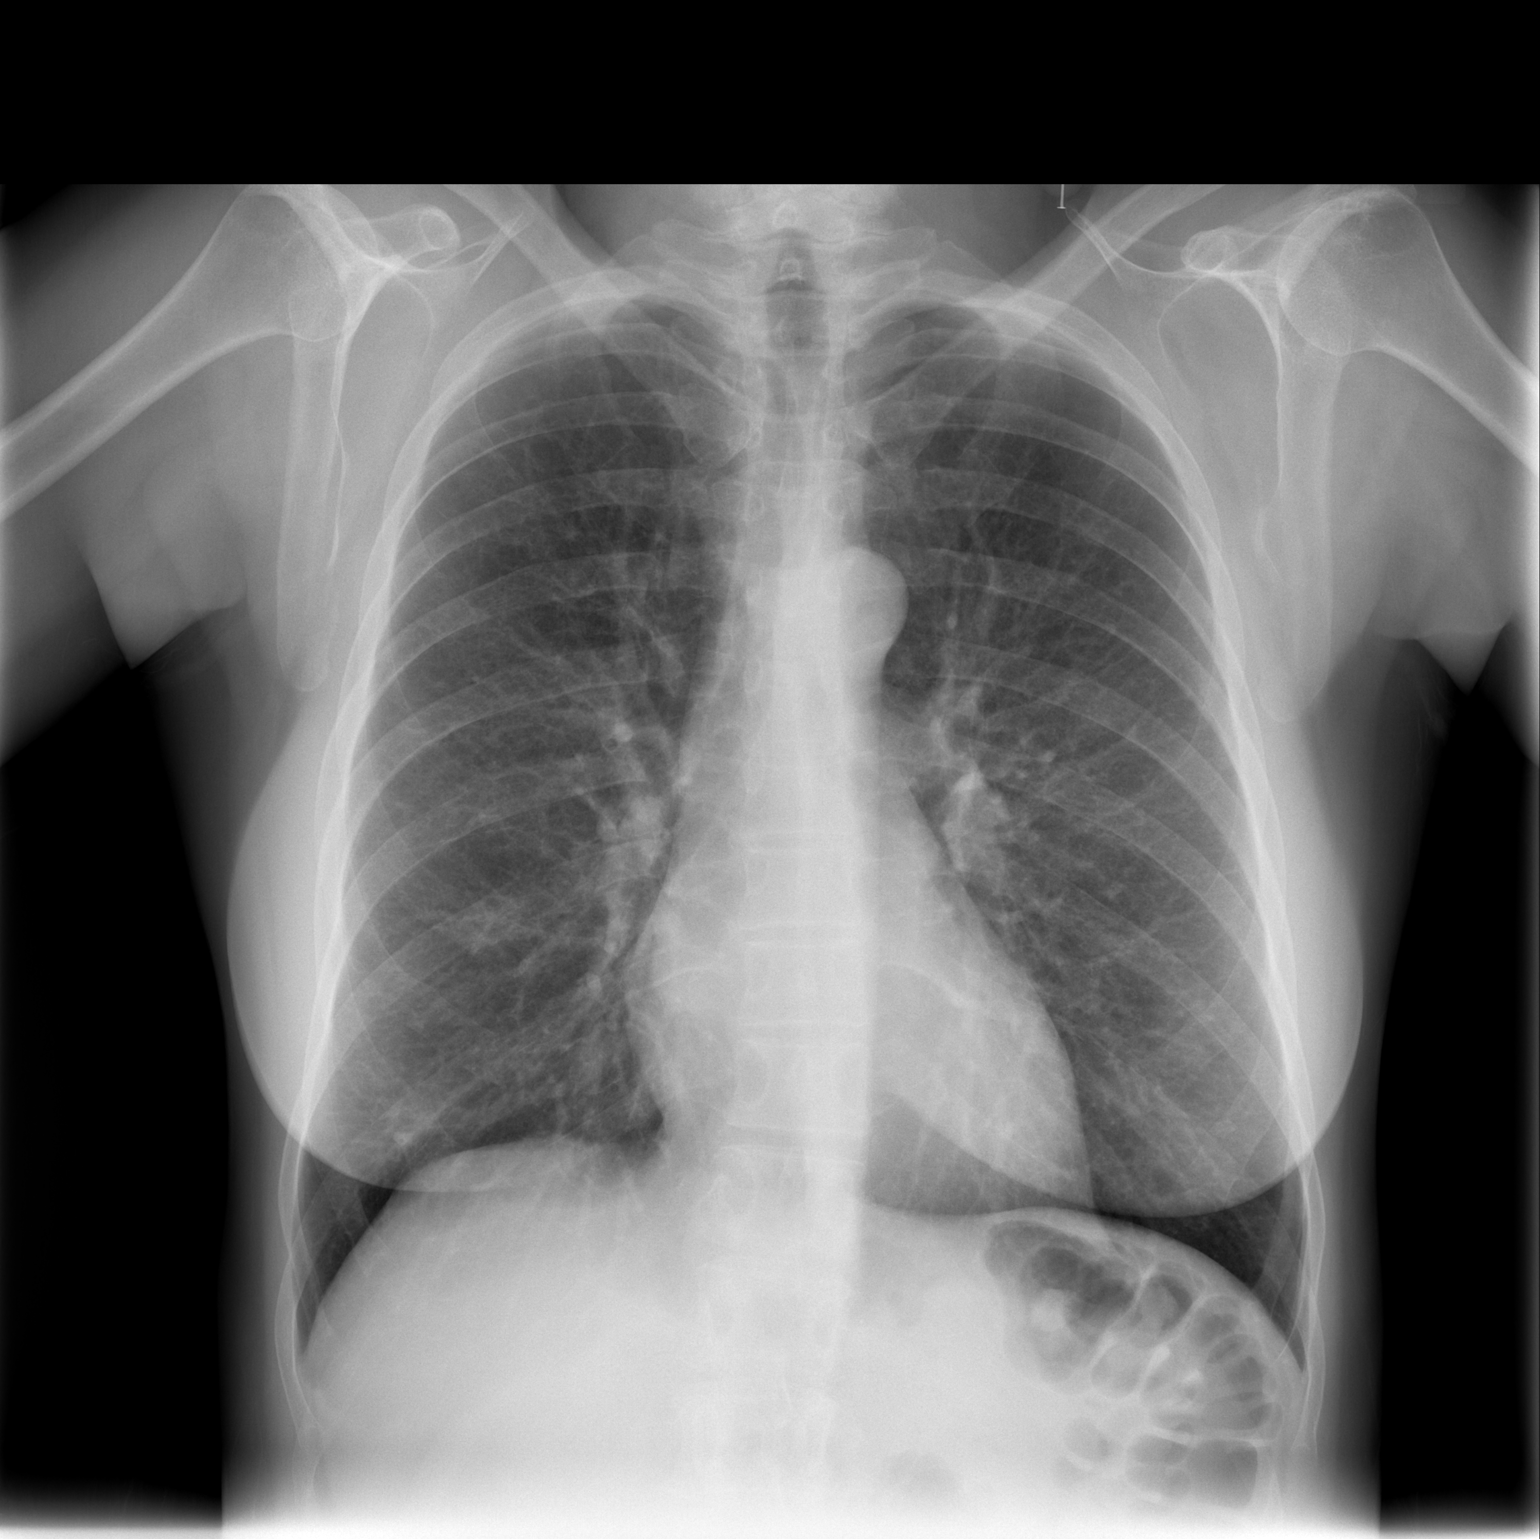

[w chest lat]
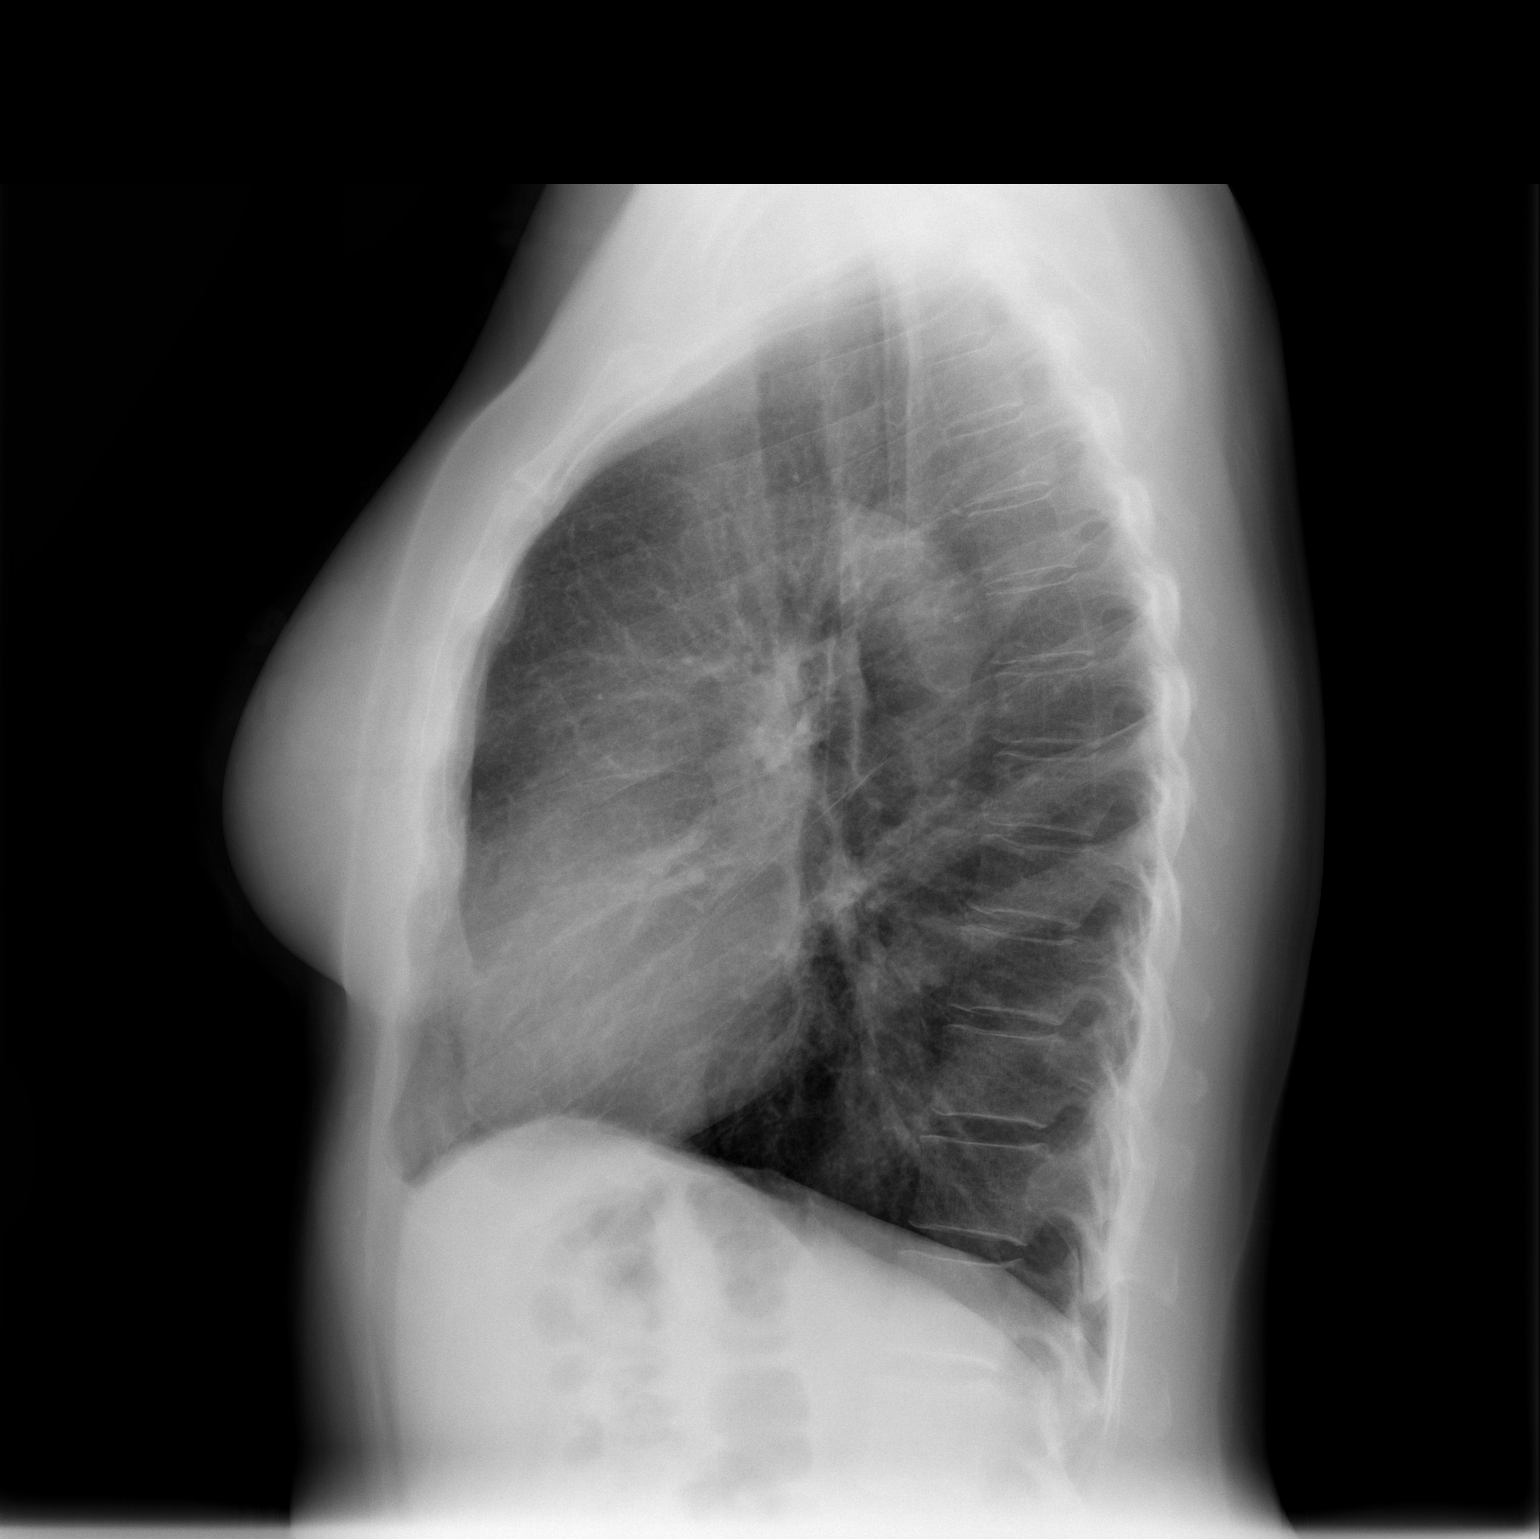

[2 of 2 positions shown; findings below may reference images not displayed]

FINDINGS: Lungs appear mildly hyperexpanded with flattening of the
hemidiaphragms, increased retrosternal air space and pruning of the
pulmonary vasculature in the periphery, suggestive of underlying
COPD.  Mild diffuse bronchial wall thickening appears to be chronic
and unchanged.  No consolidative airspace disease.  No pleural
effusions.  No pneumothorax.  No pulmonary nodule or mass noted.
Pulmonary vasculature and the cardiomediastinal silhouette are
within normal limits.   Atherosclerosis in the thoracic aorta.
IMPRESSION: 1. Chronic changes suggestive of mild COPD redemonstrated, as
above, without radiographic evidence of acute cardiopulmonary
disease.
2.  Atherosclerosis.

## 2015-01-03 MED ORDER — HYDROCODONE-ACETAMINOPHEN 5-325 MG PO TABS: 2.0000 | ORAL_TABLET | ORAL | Status: DC | PRN

## 2015-01-03 MED ORDER — HYDROMORPHONE HCL 1 MG/ML IJ SOLN
1.0000 mg | Freq: Once | INTRAMUSCULAR | Status: AC
Start: 2015-01-03 — End: 2015-01-03
  Administered 2015-01-03: 1 mg via INTRAMUSCULAR
  Filled 2015-01-03: qty 1

## 2015-01-03 NOTE — Telephone Encounter (Signed)
Pt states she can be reach at number on chart or [phone] as her minutes as running low.

## 2015-01-03 NOTE — Telephone Encounter (Addendum)
Return call to pt. Pt asking for pain medication for chronic pain to spine and now to chest area. Narcotic meds are not listed in her Med sheet.  Paged Dr Naaman Plummer -  Dr Maple Mirza states pt would need OV for any pain med.  She has not been receiving them from [place].

## 2015-01-03 NOTE — Discharge Instructions (Signed)
Musculoskeletal Pain Follow up with your primary care provider for pain management.  Do not operate heavy machinery machinery or drive when using pain medication. Musculoskeletal pain is muscle and boney aches and pains. These pains can occur in any part of the body. Your caregiver may treat you without knowing the cause of the pain. They may treat you if blood or urine tests, X-rays, and other tests were normal.  CAUSES There is often not a definite cause or reason for these pains. These pains may be caused by a type of germ (virus). The discomfort may also come from overuse. Overuse includes working out too hard when your body is not fit. Boney aches also come from weather changes. Bone is sensitive to atmospheric pressure changes. HOME CARE INSTRUCTIONS   Ask when your test results will be ready. Make sure you get your test results.  Only take over-the-counter or prescription medicines for pain, discomfort, or fever as directed by your caregiver. If you were given medications for your condition, do not drive, operate machinery or power tools, or sign legal documents for 24 hours. Do not drink alcohol. Do not take sleeping pills or other medications that may interfere with treatment.  Continue all activities unless the activities cause more pain. When the pain lessens, slowly resume normal activities. Gradually increase the intensity and duration of the activities or exercise.  During periods of severe pain, bed rest may be helpful. Lay or sit in any position that is comfortable.  Putting ice on the injured area.  Put ice in a bag.  Place a towel between your skin and the bag.  Leave the ice on for 15 to 20 minutes, 3 to 4 times a day.  Follow up with your caregiver for continued problems and no reason can be found for the pain. If the pain becomes worse or does not go away, it may be necessary to repeat tests or do additional testing. Your caregiver may need to look further for a possible  cause. SEEK IMMEDIATE MEDICAL CARE IF:  You have pain that is getting worse and is not relieved by medications.  You develop chest pain that is associated with shortness or breath, sweating, feeling sick to your stomach (nauseous), or throw up (vomit).  Your pain becomes localized to the abdomen.  You develop any new symptoms that seem different or that concern you. MAKE SURE YOU:   Understand these instructions.  Will watch your condition.  Will get help right away if you are not doing well or get worse. Document Released: 03/30/2005 Document Revised: 06/22/2011 Document Reviewed: 12/02/2012 [name] Patient Information 2015 [place], [place]. This information is not intended to replace advice given to you by your health care provider. Make sure you discuss any questions you have with your health care provider.

## 2015-01-03 NOTE — Telephone Encounter (Signed)
Unfortunately pt is not candidate for narcotic therapy for our clinic due to past FYI in chart, please see below:    Date of pain contract violation(s): 06/2010 and 07/2010  Details: Multiple infractions of her pain contract with [facility]n [employer] (has had oxymorphone, oxycodone, hydromorphone in urine when only prescribed Vicodin), multiple red flag behaviors, persistent return to ER for pain meds without informing [facility] (which she has been specifically counseled upon), and calling for early RX refills - which are all violations of her pain contract.   Dr. Naaman Plummer

## 2015-01-03 NOTE — ED Notes (Signed)
PT CALLED---NO ANSWER IN LOBBY.

## 2015-01-03 NOTE — ED Notes (Signed)
PA at bedside.

## 2015-01-03 NOTE — Telephone Encounter (Signed)
CSW placed call to Brenda [name], pt notified PCP recommending pt to contact pulmonologist regarding request for letter.  Brenda [name] aware polysubstance abuse dx has been resolved from her problem list.

## 2015-01-03 NOTE — ED Notes (Signed)
Patient states that she was diagnosed with lung cancer and states increased pain to joints. Patient states that she was told to come to the ED for pain medications. Patient states she also has an abscess to right buttocks that she was been attempting to "squeeze" at home.
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
## 2015-01-03 NOTE — Telephone Encounter (Signed)
During a phone call to [name], pt requesting refill of "Hydrocodone 5".  Pt states "he told me I should start asking my regular doctor to prescribe this.  I take Hydrocodone 5 and I'm in a lot of pain.  Pain down to the bone, I really don't want to go to the ED for this.  And, let her know this is the last day I have insurance.  I can come by today and pick it up."  Pt aware CSW will forward to refill nurse and physician.

## 2015-01-04 ENCOUNTER — Ambulatory Visit (INDEPENDENT_AMBULATORY_CARE_PROVIDER_SITE_OTHER): Payer: Self-pay | Admitting: Internal Medicine

## 2015-01-04 ENCOUNTER — Encounter: Payer: Self-pay | Admitting: Internal Medicine

## 2015-01-04 VITALS — BP 164/69 | HR 63 | Temp 98.0°F | Wt 122.8 lb

## 2015-01-04 DIAGNOSIS — Z79899 Other long term (current) drug therapy: Secondary | ICD-10-CM

## 2015-01-04 DIAGNOSIS — Z0000 Encounter for general adult medical examination without abnormal findings: Secondary | ICD-10-CM

## 2015-01-04 DIAGNOSIS — F17210 Nicotine dependence, cigarettes, uncomplicated: Secondary | ICD-10-CM

## 2015-01-04 DIAGNOSIS — G894 Chronic pain syndrome: Secondary | ICD-10-CM

## 2015-01-04 DIAGNOSIS — R911 Solitary pulmonary nodule: Secondary | ICD-10-CM

## 2015-01-04 DIAGNOSIS — I10 Essential (primary) hypertension: Secondary | ICD-10-CM

## 2015-01-04 DIAGNOSIS — Z23 Encounter for immunization: Secondary | ICD-10-CM

## 2015-01-04 MED ORDER — HYDROCHLOROTHIAZIDE 12.5 MG PO CAPS: 12.5000 mg | ORAL_CAPSULE | Freq: Every day | ORAL | Status: DC

## 2015-01-04 MED ORDER — HYDROCODONE-ACETAMINOPHEN 5-325 MG PO TABS: 1.0000 | ORAL_TABLET | Freq: Four times a day (QID) | ORAL | Status: DC | PRN

## 2015-01-04 MED ORDER — CYCLOBENZAPRINE HCL 5 MG PO TABS: 5.0000 mg | ORAL_TABLET | Freq: Three times a day (TID) | ORAL | Status: DC | PRN
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
## 2015-01-04 NOTE — Patient Instructions (Addendum)
-  Take vicodin every 6 hrs as needed for pain, don't take tramadol anymore  -Start taking HCTZ 12.5 mg daily for high blood pressure -Take flexeril 5 mg three times daily as needed for muscle spasms instead of robaxin  -Please follow-up with cardiology for pre-op clearance -Will give you a flu shot today -Please come back in 1 month to recheck your blood pressure and pain level, very nice seeing you!  General Instructions:   Please bring your medicines with you each time you come to clinic.  Medicines may include prescription medications, over-the-counter medications, herbal remedies, eye drops, vitamins, or other pills.   Progress Toward Treatment Goals:  Treatment Goal 03/19/2014  Blood pressure deteriorated  Stop smoking smoking the same amount  Prevent falls improved    Self Care Goals & Plans:  Self Care Goal 05/30/2014  Manage my medications take my medicines as prescribed; refill my medications on time  Monitor my health keep track of my blood pressure  Eat healthy foods (No Data)  Be physically active find an activity I enjoy  Stop smoking -  Prevent falls -  Meeting treatment goals -    No flowsheet data found.   Care Management & Community Referrals:  Referral 03/19/2014  Referrals made for care management support none needed  Referrals made to community resources -

## 2015-01-06 DIAGNOSIS — Z87898 Personal history of other specified conditions: Secondary | ICD-10-CM | POA: Insufficient documentation

## 2015-01-06 NOTE — Assessment & Plan Note (Signed)
-  Pt received annual influenza vaccination today on 01/04/15 -Inquire about screening mammogram at next visit

## 2015-01-06 NOTE — Assessment & Plan Note (Signed)
Assessment: Pt is a chronic smoker with growing nodule concerning for possible lung cancer (adenocarcinoma in situ or low-grade invasive adenocarcinoma) who is to have left VATS, wedge resection, possible segmentectomy, and local catheter placement to determine if nodule is cancerous.   Plan:  -Pt counseled on tobacco cessation  -Pt to have cardiac clearance before planned surgery -Will start HCTZ 12.5 mg daily for uncontrolled hypertension in addition to narcotic therapy to help with pain-component  -Pt follows with pulmonologist Dr. Lake Bells and cardiothoracic surgeon Dr. Roxan Hockey

## 2015-01-06 NOTE — Assessment & Plan Note (Addendum)
Assessment: Pt with moderately well-controlled hypertension compliant with three-class (CCB, ACEi, and BB) anti-hypertensive therapy who presents with blood pressure of 164/69.   Plan:  -BP 164/69 not at goal <140/90 -Prescribe HCTZ 12.5 mg daily  -Continue amlodipine 10 mg daily, lopressor 25 mg BID, and lisinopril 20 mg daily   -Last CMP on 07/06/14 was normal, obtain BMP at next visit  -Pt to return in 1 month for blood pressure recheck and BMP

## 2015-01-06 NOTE — Assessment & Plan Note (Signed)
Assessment: Pt with chronic low back pain with right-sided sciatica with last MRI on 04/20/14 revealing persistent advanced facet arthropathy of lower lumbar spine with associated marrow edema and possible right L4 nerve root impingement as well as chronic neck pain with right hand paraesthesias with last MRI on 11/02/13 with multifactorial degenerative cervical stenosis and arthropathy with moderate spinal cord mass effect of C6-7 who presents with uncontrolled pain on current pain regimen with no alarm symptoms.   Plan:   -Due to extensive cervical and spinal pathology with spinal cord inflammation and mass effect and uncontrolled pain will give pt another chance to receive narcotics from our clinic with very close monitoring to ensure there is no abuse -Prescribe norco/vicodin 5-325 mg Q 6 hr PRN pain (#120) for 30-day supply  -Continue gabapentin 600 mg TID and voltaren 1% gel QID PRN pain -Discontinue tramadol 50 mg Q 6 hr PRN, robaxin 500 mg Q 6 hr PRN, and ibuprofen 800 Q 6 hr PRN pain/inflammation due to ineffectiveness  -Prescribe flexeril 5 mg TID PRN muscle spasms -Pt no longer follows with PM&R (Dr. Letta Pate) due to ineffectiveness of medical therapy and does not want to reestablish care with him or any other pain clinic -Pt to be eventually seen by neurosurgery at [facility] instructed to return in 30 days at which point prescription drug abuse monitoring will be obtained to determine if further narcotics will be prescribed from our clinic

## 2015-01-07 NOTE — Progress Notes (Signed)
Internal Medicine Clinic Attending  Case discussed with Dr. Naaman Plummer soon after the resident saw the patient.  We reviewed the resident's history and exam and pertinent patient test results.  I agree with the assessment, diagnosis, and plan of care documented in the resident's note. Brenda [name] will need very close monitoring as she was unable to comply with obligations in past.

## 2015-01-10 ENCOUNTER — Telehealth: Payer: Self-pay | Admitting: Pulmonary Disease

## 2015-01-10 NOTE — Telephone Encounter (Signed)
Called # provided and was advised by a female I had the wrong number. Called alternated # and someone answered but never said anything. WCB

## 2015-01-10 NOTE — Telephone Encounter (Signed)
Pt returning call and can be reached @ [phone] and said that it is okay to leave message with person that answers phone.Brenda [name]

## 2015-01-10 NOTE — Telephone Encounter (Signed)
Left message for pt to call back  °

## 2015-01-10 NOTE — Telephone Encounter (Signed)
Spoke with the pt  She states that her PCP and clinical social worker from the hospital advised her to call Dr Lake Bells to inquire about letter for medicaid  She is asking Dr. Pennie Banter to write a letter stating that she would benefit from [facility] services  She is having upcoming surgery which will require homecare post op  If Dr Lake Bells is okay with writing letter, she would like [place] to mail this to her home address which I have verified is correct  Please advise, thanks!

## 2015-01-11 NOTE — Telephone Encounter (Signed)
LMTCB

## 2015-01-11 NOTE — Telephone Encounter (Signed)
No, PCP needs to do that

## 2015-01-14 ENCOUNTER — Ambulatory Visit: Payer: Self-pay

## 2015-01-14 NOTE — Telephone Encounter (Signed)
[phone] pt calling back

## 2015-01-14 NOTE — Telephone Encounter (Signed)
lmtcb for pt.  
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
## 2015-01-15 ENCOUNTER — Ambulatory Visit (INDEPENDENT_AMBULATORY_CARE_PROVIDER_SITE_OTHER): Payer: Self-pay | Admitting: Cardiovascular Disease

## 2015-01-15 ENCOUNTER — Encounter: Payer: Self-pay | Admitting: Cardiovascular Disease

## 2015-01-15 VITALS — BP 158/86 | HR 72 | Ht 63.0 in | Wt 121.7 lb

## 2015-01-15 DIAGNOSIS — Z01818 Encounter for other preprocedural examination: Secondary | ICD-10-CM

## 2015-01-15 DIAGNOSIS — E785 Hyperlipidemia, unspecified: Secondary | ICD-10-CM

## 2015-01-15 NOTE — Telephone Encounter (Signed)
lmtcb x2 for pt. 

## 2015-01-15 NOTE — Patient Instructions (Signed)
Your physician recommends that you return for lab work at your earliest [place].  Dr Oval Linsey recommends that you schedule a follow-up appointment in 1 year. You will receive a reminder letter in the mail two months in advance. If you don't receive a letter, please call our office to schedule the follow-up appointment.  You have been cleared at low cardiovascular risk for surgery.

## 2015-01-29 ENCOUNTER — Other Ambulatory Visit: Payer: Self-pay | Admitting: *Deleted

## 2015-01-29 ENCOUNTER — Ambulatory Visit (INDEPENDENT_AMBULATORY_CARE_PROVIDER_SITE_OTHER): Payer: Self-pay | Admitting: Thoracic Surgery (Cardiothoracic Vascular Surgery)

## 2015-01-29 ENCOUNTER — Encounter: Payer: Self-pay | Admitting: Thoracic Surgery (Cardiothoracic Vascular Surgery)

## 2015-01-29 VITALS — BP 185/102 | HR 96 | Resp 20 | Ht 63.0 in | Wt 121.0 lb

## 2015-01-29 DIAGNOSIS — R911 Solitary pulmonary nodule: Secondary | ICD-10-CM

## 2015-01-29 DIAGNOSIS — Z720 Tobacco use: Secondary | ICD-10-CM

## 2015-01-29 DIAGNOSIS — J432 Centrilobular emphysema: Secondary | ICD-10-CM
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
## 2015-01-31 ENCOUNTER — Telehealth: Payer: Self-pay | Admitting: Licensed Clinical Social Worker

## 2015-01-31 NOTE — Telephone Encounter (Signed)
Brenda [name] placed call to CSW and left 2 messages.  Pt states she is scheduled for surgery on 02/13/15 at 8:15am at [facility] and would like this worker to assist with Emergency Medicaid, as she will need [facility] RN at discharge.  In addition, Brenda [name] states she is providing verbal ROI for CSW to speak with [name], [employer]; [employer], [name] and [employer].  Brenda [name] requesting CSW to visit to her stay at [facility]. CSW returned call to Brenda [name].  CSW informed Brenda [name] this worker unable to assist with Emergency Medicaid.  Encouraged pt to speak with a financial counselor during admission to determine if they are able to start a medicaid application.  CSW also discussed care manager in hospital will assist with home health services/options if required at discharge.  CSW will attempt to visit with patient following surgery.

## 2015-02-04 ENCOUNTER — Ambulatory Visit: Payer: Self-pay | Admitting: Internal Medicine

## 2015-02-07 ENCOUNTER — Encounter: Payer: Self-pay | Admitting: Internal Medicine

## 2015-02-07 ENCOUNTER — Ambulatory Visit (INDEPENDENT_AMBULATORY_CARE_PROVIDER_SITE_OTHER): Payer: Self-pay | Admitting: Internal Medicine

## 2015-02-07 VITALS — BP 194/84 | HR 88 | Temp 98.1°F | Ht 63.5 in | Wt 123.8 lb

## 2015-02-07 DIAGNOSIS — G894 Chronic pain syndrome: Secondary | ICD-10-CM

## 2015-02-07 DIAGNOSIS — I10 Essential (primary) hypertension: Secondary | ICD-10-CM

## 2015-02-07 MED ORDER — LISINOPRIL-HYDROCHLOROTHIAZIDE 20-12.5 MG PO TABS: 2.0000 | ORAL_TABLET | Freq: Every day | ORAL | Status: DC

## 2015-02-07 MED ORDER — HYDROCODONE-ACETAMINOPHEN 5-325 MG PO TABS: 1.0000 | ORAL_TABLET | Freq: Four times a day (QID) | ORAL | Status: DC | PRN

## 2015-02-07 NOTE — Progress Notes (Signed)
   Subjective:    Patient ID: Brenda [name], female    DOB: [birth year], 54 y.o.   MRN: [id number]  HPI Brenda [name] is a 54yo woman with PMHx of HTN, IBS, GERD, and depression/anxiety who presents today for follow up of her chronic pain.   Patient reports significant pain in her neck, upper back, and lower back with right-sided sciatica. She reports the Norco she was recently prescribed on 9/23 "barely touches the pain." She states she "would like to just feel normal for one day." She has been taking the Norco 5-325 mg every 6 hours. Her last dose was at 8:30 AM this morning. She notes her back/sciatica pain interferes with her walking and she has been unable to have intercourse because of the pain. She is very tearful throughout most of the interview. She reports "needing" her pain medication because she is scheduled to have lung surgery next week. She is very anxious about this. She denies any warning symptoms such as saddle anesthesia and loss of bowel or bladder control. She states she was seen at [facility] about 1 week ago due to the severity of the pain. She reports she received Dilaudid there and another "blue pill" which she thinks was Hydrocodone.    Review of Systems General: Denies fever, chills, night sweats, changes in weight, changes in appetite HEENT: Denies headaches, ear pain, changes in vision, rhinorrhea, sore throat CV: Denies CP, palpitations, SOB, orthopnea Pulm: Denies SOB, cough, wheezing GI: Denies abdominal pain, nausea, vomiting, diarrhea, constipation, melena, hematochezia GU: Denies dysuria, hematuria, frequency Msk: See HPI Neuro: Denies numbness, tingling Skin: Denies rashes, bruising Psych: Denies hallucinations    Objective:   Physical Exam General: middle aged woman sitting up, appears anxious and tearful at times HEENT: [place]/AT, EOMI, sclera anicteric, mucus membranes moist CV: RRR, no m/g/r Pulm: CTA bilaterally, breaths non-labored Abd: BS+, soft,  non-tender Ext: warm, no peripheral edema Neuro: alert and oriented x 3. Strength 5/5 in upper extremities and 4/5 in left LE and 4+/5 in right LE secondary to pain.    Assessment & Plan:  Please refer to A&P documentation.

## 2015-02-07 NOTE — Patient Instructions (Signed)
-   Change to combination blood pressure pill (2 pills daily) - Can finish your current bottles for HCTZ and Lisinopril but increase doses to HCTZ 25 mg daily (2 pills) and Lisinopril 40 mg daily (2 pills) - Use Norco responsibly. Do not drive after taking Norco.   General Instructions:   Please bring your medicines with you each time you come to clinic.  Medicines may include prescription medications, over-the-counter medications, herbal remedies, eye drops, vitamins, or other pills.   Progress Toward Treatment Goals:  Treatment Goal 03/19/2014  Blood pressure deteriorated  Stop smoking smoking the same amount  Prevent falls improved    Self Care Goals & Plans:  Self Care Goal 02/07/2015  Manage my medications take my medicines as prescribed; bring my medications to every visit; refill my medications on time  Monitor my health keep track of my blood pressure  Eat healthy foods eat more vegetables; eat foods that are low in salt; eat baked foods instead of fried foods  Be physically active find an activity I enjoy  Stop smoking -  Prevent falls have my vision checked; wear appropriate shoes  Meeting treatment goals -    No flowsheet data found.   Care Management & Community Referrals:  Referral 03/19/2014  Referrals made for care management support none needed  Referrals made to community resources -

## 2015-02-07 NOTE — Assessment & Plan Note (Signed)
Patient complaining of worsening upper back and lower back pain with associated right-sided sciatica. She states the Norco 5-325 mg tablets "barely touch her pain." She asked if I could increase the dosage. I explained that I could not and that she needs to discuss this with her PCP. She seems a high risk patient to have on chronic opioid therapy given her previous inappropriate UDS's. Additionally, she went to [facility] last week where she received Dilaudid and possibly Hydrocodone. Her hydromorphone levels should be undetectable by now, but if her hydromorphone level is elevated would be suspicious for her receiving outside pain medication. She needs to be on a pain contract with the understanding that she cannot receive pain medications from other outside facilities. UDS was obtained today first and then I gave her a prescription for Norco 5-325 mg Q6H PRN #120 tablets.  - Recommend for PCP to do pain contract if going to prescribe opioids to her - Follow up in 30 days - Would consider doing another random UDS in the near future as she was expecting this one

## 2015-02-07 NOTE — Assessment & Plan Note (Addendum)
BP Readings from Last 3 Encounters:  02/07/15 194/84  01/29/15 185/102  01/15/15 158/86    Lab Results  Component Value Date   NA 139 07/06/2014   K 3.8 07/06/2014   CREATININE 0.71 07/06/2014    Assessment: Blood pressure control:  BP significantly elevated today likely partially due to pain/anxiety but needs to be more aggressively treated.    Plan: Medications:  Will switch her to a combination pill- HCTZ-Lisinopril 25-40 mg daily. Continue Amlodipine 10 mg daily and Lopressor 25 mg BID. Instructed her that she could finish old bottles of HCTZ and Lisinopril but needed to increase doses to 25 mg and 40 mg, respectively.  Other plans:  - BP recheck in 1 month

## 2015-02-09 NOTE — Pre-Procedure Instructions (Signed)
ZALEAH TERNES  02/09/2015      Your procedure is scheduled on November 2.  Report to [facility] Admitting at 6:30 A.M.  Call this number if you have problems the morning of surgery:  [phone]   Remember:  Do not eat food or drink liquids after midnight.  Take these medicines the morning of surgery with A SIP OF WATER: amLODipine (NORVASC), divalproex (DEPAKOTE)  famotidine (PEPCID), gabapentin (NEURONTIN), HYDROcodone-acetaminophen (NORCO/VICODIN) if needed,  metoprolol tartrate (LOPRESSOR), sertraline (ZOLOFT)   STOP Multiple Vitamin, Aspirin today     STOP/ Do not take Aspirin, Aleve, Naproxen, Advil, Ibuprofen, Motrin, Vitamins, Herbs, or Supplements starting today   Do not wear jewelry, make-up or nail polish.  Do not wear lotions, powders, or perfumes.  You may wear deodorant.  Do not shave 48 hours prior to surgery.  Men may shave face and neck.  Do not bring valuables to the hospital.  [facility] is not responsible for any belongings or valuables.  Contacts, dentures or bridgework may not be worn into surgery.  Leave your suitcase in the car.  After surgery it may be brought to your room.  For patients admitted to the hospital, discharge time will be determined by your treatment team.  Patients discharged the day of surgery will not be allowed to drive home.   [facility] - Preparing for Surgery  Before surgery, you can play an important role.  Because skin is not sterile, your skin needs to be as free of germs as possible.  You can reduce the number of germs on you skin by washing with CHG (chlorahexidine gluconate) soap before surgery.  CHG is an antiseptic cleaner which kills germs and bonds with the skin to continue killing germs even after washing.  Please DO NOT use if you have an allergy to CHG or antibacterial soaps.  If your skin becomes reddened/irritated stop using the CHG and inform your nurse when you arrive at Short Stay.  Do not shave  (including legs and underarms) for at least 48 hours prior to the first CHG shower.  You may shave your face.  Please follow these instructions carefully:   1.  Shower with CHG Soap the night before surgery and the morning of Surgery.  2.  If you choose to wash your hair, wash your hair first as usual with your normal shampoo.  3.  After you shampoo, rinse your hair and body thoroughly to remove the shampoo.  4.  Use CHG as you would any other liquid soap.  You can apply CHG directly to the skin and wash gently with scrungie or a clean washcloth.  5.  Apply the CHG Soap to your body ONLY FROM THE NECK DOWN.  Do not use on open wounds or open sores.  Avoid contact with your eyes, ears, mouth and genitals (private parts).  Wash genitals (private parts) with your normal soap.  6.  Wash thoroughly, paying special attention to the area where your surgery will be performed.  7.  Thoroughly rinse your body with warm water from the neck down.  8.  DO NOT shower/wash with your normal soap after using and rinsing off the CHG Soap.  9.  Pat yourself dry with a clean towel.            10.  Wear clean pajamas.            11.  Place clean sheets on your bed the night of  your first shower and do not sleep with pets.  Day of Surgery  Do not apply any lotions the morning of surgery.  Please wear clean clothes to the hospital/surgery center.  Please read over the following fact sheets that you were given. Pain Booklet, Coughing and Deep Breathing, Blood Transfusion Information and Surgical Site Infection Prevention

## 2015-02-11 ENCOUNTER — Encounter (HOSPITAL_COMMUNITY)
Admission: RE | Admit: 2015-02-11 | Discharge: 2015-02-11 | Disposition: A | Discharge status: Home / Self Care | Payer: Self-pay | Source: Ambulatory Visit | Attending: Thoracic Surgery (Cardiothoracic Vascular Surgery) | Admitting: Thoracic Surgery (Cardiothoracic Vascular Surgery)

## 2015-02-11 ENCOUNTER — Encounter (HOSPITAL_COMMUNITY): Payer: Self-pay

## 2015-02-11 ENCOUNTER — Ambulatory Visit (HOSPITAL_COMMUNITY)
Admission: RE | Admit: 2015-02-11 | Discharge: 2015-02-11 | Disposition: A | Discharge status: Home / Self Care | Payer: Self-pay | Source: Ambulatory Visit | Attending: Thoracic Surgery (Cardiothoracic Vascular Surgery) | Admitting: Thoracic Surgery (Cardiothoracic Vascular Surgery)

## 2015-02-11 VITALS — BP 116/60 | HR 83 | Temp 98.1°F | Resp 20 | Ht 63.5 in | Wt 122.0 lb

## 2015-02-11 DIAGNOSIS — Z0183 Encounter for blood typing: Secondary | ICD-10-CM | POA: Insufficient documentation

## 2015-02-11 DIAGNOSIS — R911 Solitary pulmonary nodule: Secondary | ICD-10-CM

## 2015-02-11 DIAGNOSIS — Z7982 Long term (current) use of aspirin: Secondary | ICD-10-CM | POA: Insufficient documentation

## 2015-02-11 DIAGNOSIS — Z87891 Personal history of nicotine dependence: Secondary | ICD-10-CM | POA: Insufficient documentation

## 2015-02-11 DIAGNOSIS — Z01818 Encounter for other preprocedural examination: Secondary | ICD-10-CM | POA: Insufficient documentation

## 2015-02-11 DIAGNOSIS — I2510 Atherosclerotic heart disease of native coronary artery without angina pectoris: Secondary | ICD-10-CM | POA: Insufficient documentation

## 2015-02-11 DIAGNOSIS — I10 Essential (primary) hypertension: Secondary | ICD-10-CM | POA: Insufficient documentation

## 2015-02-11 DIAGNOSIS — Z79899 Other long term (current) drug therapy: Secondary | ICD-10-CM | POA: Insufficient documentation

## 2015-02-11 DIAGNOSIS — Z8673 Personal history of transient ischemic attack (TIA), and cerebral infarction without residual deficits: Secondary | ICD-10-CM | POA: Insufficient documentation

## 2015-02-11 DIAGNOSIS — Z01812 Encounter for preprocedural laboratory examination: Secondary | ICD-10-CM | POA: Insufficient documentation

## 2015-02-11 DIAGNOSIS — E785 Hyperlipidemia, unspecified: Secondary | ICD-10-CM | POA: Insufficient documentation

## 2015-02-11 DIAGNOSIS — I252 Old myocardial infarction: Secondary | ICD-10-CM | POA: Insufficient documentation

## 2015-02-11 LAB — URINALYSIS, ROUTINE W REFLEX MICROSCOPIC
Bilirubin Urine: NEGATIVE
Glucose, UA: NEGATIVE mg/dL
KETONES UR: NEGATIVE mg/dL
LEUKOCYTES UA: NEGATIVE
NITRITE: NEGATIVE
PROTEIN: NEGATIVE mg/dL
Specific Gravity, Urine: 1.011 (ref 1.005–1.030)
UROBILINOGEN UA: 0.2 mg/dL (ref 0.0–1.0)
pH: 5.5 (ref 5.0–8.0)

## 2015-02-11 LAB — PULMONARY FUNCTION TEST
DL/VA % PRED: 60 %
DL/VA: 2.87 ml/min/mmHg/L
DLCO UNC % PRED: 61 %
DLCO UNC: 14.3 ml/min/mmHg
FEF 25-75 Post: 1.76 L/sec
FEF 25-75 Pre: 1.34 L/sec
FEF2575-%CHANGE-POST: 30 %
FEF2575-%PRED-POST: 68 %
FEF2575-%Pred-Pre: 52 %
FEV1-%CHANGE-POST: 3 %
FEV1-%PRED-POST: 88 %
FEV1-%Pred-Pre: 85 %
FEV1-PRE: 2.27 L
FEV1-Post: 2.35 L
FEV1FVC-%CHANGE-POST: 1 %
FEV1FVC-%Pred-Pre: 90 %
FEV6-%Change-Post: 2 %
FEV6-%PRED-POST: 97 %
FEV6-%Pred-Pre: 94 %
FEV6-PRE: 3.1 L
FEV6-Post: 3.19 L
FEV6FVC-%Change-Post: 1 %
FEV6FVC-%PRED-PRE: 101 %
FEV6FVC-%Pred-Post: 102 %
FVC-%CHANGE-POST: 1 %
FVC-%Pred-Post: 95 %
FVC-%Pred-Pre: 93 %
FVC-POST: 3.21 L
FVC-Pre: 3.16 L
POST FEV1/FVC RATIO: 73 %
PRE FEV6/FVC RATIO: 98 %
Post FEV6/FVC ratio: 99 %
Pre FEV1/FVC ratio: 72 %
RV % pred: 147 %
RV: 2.68 L
TLC % PRED: 116 %
TLC: 5.76 L

## 2015-02-11 LAB — PROTIME-INR
INR: 1.01 (ref 0.00–1.49)
Prothrombin Time: 13.5 seconds (ref 11.6–15.2)

## 2015-02-11 LAB — URINE MICROSCOPIC-ADD ON

## 2015-02-11 LAB — COMPREHENSIVE METABOLIC PANEL
ALK PHOS: 73 U/L (ref 38–126)
ALT: 12 U/L — AB (ref 14–54)
AST: 22 U/L (ref 15–41)
Albumin: 4.3 g/dL (ref 3.5–5.0)
Anion gap: 13 (ref 5–15)
BUN: 18 mg/dL (ref 6–20)
CALCIUM: 9.5 mg/dL (ref 8.9–10.3)
CO2: 19 mmol/L — AB (ref 22–32)
CREATININE: 0.61 mg/dL (ref 0.44–1.00)
Chloride: 106 mmol/L (ref 101–111)
GFR calc non Af Amer: >60 mL/min (ref >60)
GLUCOSE: 106 mg/dL — AB (ref 65–99)
Potassium: 4.5 mmol/L (ref 3.5–5.1)
SODIUM: 138 mmol/L (ref 135–145)
Total Bilirubin: 0.8 mg/dL (ref 0.3–1.2)
Total Protein: 7.5 g/dL (ref 6.5–8.1)

## 2015-02-11 LAB — TYPE AND SCREEN
ABO/RH(D): B NEG
Antibody Screen: NEGATIVE

## 2015-02-11 LAB — SURGICAL PCR SCREEN
MRSA, PCR: NEGATIVE
Staphylococcus aureus: NEGATIVE

## 2015-02-11 LAB — ABO/RH: ABO/RH(D): B NEG

## 2015-02-11 LAB — APTT: aPTT: 37 seconds (ref 24–37)

## 2015-02-11 MED ORDER — ALBUTEROL SULFATE (2.5 MG/3ML) 0.083% IN NEBU
2.5000 mg | INHALATION_SOLUTION | Freq: Once | RESPIRATORY_TRACT | Status: AC
  Administered 2015-02-11: 2.5 mg via RESPIRATORY_TRACT

## 2015-02-11 NOTE — Progress Notes (Signed)
PCP - Dr. Juluis Mire Cardiologist - Dr. Skeet Latch  EKG- 01/15/15 CXR- 02/11/15  Stress test - 11/15/13 Cardiac Cath - 06/26/2009  Cardiology clearance in Epic on 01/14/2015 from Dr. Skeet Latch  Patient denies chest pain and shortness of breath at PAT appointment.  Patient appears very anxious about procedure.  Patient educated on what to expect pre-procedure.

## 2015-02-11 NOTE — Progress Notes (Signed)
ABG and CBC will need to be repeated day of surgery.

## 2015-02-11 NOTE — Progress Notes (Signed)
Brenda [name] made aware of urine results and informed that CBC and ABG will need to be drawn DOS.

## 2015-02-12 MED ORDER — CEFUROXIME SODIUM 1.5 G IJ SOLR
1.5000 g | INTRAMUSCULAR | Status: AC
  Administered 2015-02-13: 1.5 g via INTRAVENOUS
  Filled 2015-02-12: qty 1.5

## 2015-02-12 NOTE — Progress Notes (Signed)
Internal Medicine Clinic Attending  Case discussed with Dr. Rivet soon after the resident saw the patient.  We reviewed the resident's history and exam and pertinent patient test results.  I agree with the assessment, diagnosis, and plan of care documented in the resident's note.  

## 2015-02-12 NOTE — Progress Notes (Signed)
Anesthesia Chart Review:  Pt is 54 year old female scheduled for VATS, L chest wedge resection, possible L segmentectomy on 02/13/2015 with Dr. Roxan Hockey.   PCP is Dr. Juluis Mire. Cardiologist is Dr. Skeet Latch.   PMH includes: CAD (MI, nonobstructive by 2011 cath), HTN, hyperlipidemia, TIA, CHF, anemia, PTSD, polysubstance abuse (marijuana, cocaine, opiate, etoh; pt denies current use). Former smoker. BMI 21.   Medications include: amlodipine, ASA, lisinopril-hctz, pepcid, metoprolol, simvastatin.   Preoperative labs reviewed. Many bacteria on UA but no WBC. CBC and ABG will need to be obtained DOS. PAT RN notified Ryan in Dr. Leonarda Salon office of UA results.   Chest x-ray 02/11/15 reviewed.  1. No acute cardiopulmonary disease. 2. Hyperexpanded lungs consistent with COPD, stable.  EKG 01/15/15: sinus rhythm.   Nuclear stress test 11/15/13: Normal myocardial perfusion study. EF 63%.   Echo 02/05/10:  - Left ventricle: The cavity size was normal. Wall thickness was normal. Systolic function was normal. The estimated ejection fraction was in the range of 55% to 60%. - Aortic valve: Mild regurgitation. - Mitral valve: Mild regurgitation. - Left atrium: The atrium was mildly dilated. - Atrial septum: No defect or patent foramen ovale was identified.  Cardiac cath 06/26/09:  1. Nonobstructive CAD  2. Non-STEMI likely secondary to cocaine ingestion.  3. Preserved LV systolic function with a segmental wall motion abnormality.   Dr. Oval Linsey has cleared pt for surgery in Epic note dated 01/14/15.   If labs acceptable DOS, I anticipate pt can proceed as scheduled.   Willeen Cass, FNP-BC [facility] Stay Surgical Center/Anesthesiology Phone: [phone] 02/12/2015 9:40 AM

## 2015-02-13 ENCOUNTER — Inpatient Hospital Stay (HOSPITAL_COMMUNITY)
Admission: RE | Admit: 2015-02-13 | Discharge: 2015-02-18 | DRG: 165 | Disposition: A | Discharge status: Home / Self Care | Payer: Self-pay | Source: Ambulatory Visit | Attending: Thoracic Surgery (Cardiothoracic Vascular Surgery) | Admitting: Thoracic Surgery (Cardiothoracic Vascular Surgery)

## 2015-02-13 ENCOUNTER — Inpatient Hospital Stay (HOSPITAL_COMMUNITY): Payer: Self-pay

## 2015-02-13 ENCOUNTER — Encounter (HOSPITAL_COMMUNITY): Payer: Self-pay | Admitting: Surgery

## 2015-02-13 ENCOUNTER — Inpatient Hospital Stay (HOSPITAL_COMMUNITY): Payer: MEDICAID | Admitting: Emergency Medicine

## 2015-02-13 ENCOUNTER — Encounter (HOSPITAL_COMMUNITY)
Admission: EM | Disposition: A | Discharge status: Home / Self Care | Payer: Self-pay | Source: Home / Self Care | Attending: Thoracic Surgery (Cardiothoracic Vascular Surgery)

## 2015-02-13 ENCOUNTER — Inpatient Hospital Stay (HOSPITAL_COMMUNITY): Payer: Self-pay | Admitting: Certified Registered"

## 2015-02-13 DIAGNOSIS — Z4682 Encounter for fitting and adjustment of non-vascular catheter: Secondary | ICD-10-CM

## 2015-02-13 DIAGNOSIS — I252 Old myocardial infarction: Secondary | ICD-10-CM

## 2015-02-13 DIAGNOSIS — Z79899 Other long term (current) drug therapy: Secondary | ICD-10-CM

## 2015-02-13 DIAGNOSIS — F419 Anxiety disorder, unspecified: Secondary | ICD-10-CM | POA: Diagnosis present

## 2015-02-13 DIAGNOSIS — J939 Pneumothorax, unspecified: Secondary | ICD-10-CM

## 2015-02-13 DIAGNOSIS — C3432 Malignant neoplasm of lower lobe, left bronchus or lung: Principal | ICD-10-CM | POA: Diagnosis present

## 2015-02-13 DIAGNOSIS — K589 Irritable bowel syndrome without diarrhea: Secondary | ICD-10-CM | POA: Diagnosis present

## 2015-02-13 DIAGNOSIS — I10 Essential (primary) hypertension: Secondary | ICD-10-CM | POA: Diagnosis present

## 2015-02-13 DIAGNOSIS — Z8673 Personal history of transient ischemic attack (TIA), and cerebral infarction without residual deficits: Secondary | ICD-10-CM

## 2015-02-13 DIAGNOSIS — F319 Bipolar disorder, unspecified: Secondary | ICD-10-CM | POA: Diagnosis present

## 2015-02-13 DIAGNOSIS — Z79891 Long term (current) use of opiate analgesic: Secondary | ICD-10-CM

## 2015-02-13 DIAGNOSIS — C3492 Malignant neoplasm of unspecified part of left bronchus or lung: Secondary | ICD-10-CM

## 2015-02-13 DIAGNOSIS — J449 Chronic obstructive pulmonary disease, unspecified: Secondary | ICD-10-CM | POA: Diagnosis present

## 2015-02-13 DIAGNOSIS — I2510 Atherosclerotic heart disease of native coronary artery without angina pectoris: Secondary | ICD-10-CM | POA: Diagnosis present

## 2015-02-13 DIAGNOSIS — E785 Hyperlipidemia, unspecified: Secondary | ICD-10-CM | POA: Diagnosis present

## 2015-02-13 DIAGNOSIS — C3490 Malignant neoplasm of unspecified part of unspecified bronchus or lung: Secondary | ICD-10-CM

## 2015-02-13 DIAGNOSIS — F4310 Post-traumatic stress disorder, unspecified: Secondary | ICD-10-CM | POA: Diagnosis present

## 2015-02-13 DIAGNOSIS — Z7982 Long term (current) use of aspirin: Secondary | ICD-10-CM

## 2015-02-13 DIAGNOSIS — R911 Solitary pulmonary nodule: Secondary | ICD-10-CM

## 2015-02-13 DIAGNOSIS — N281 Cyst of kidney, acquired: Secondary | ICD-10-CM | POA: Diagnosis present

## 2015-02-13 DIAGNOSIS — F17210 Nicotine dependence, cigarettes, uncomplicated: Secondary | ICD-10-CM | POA: Diagnosis present

## 2015-02-13 DIAGNOSIS — G894 Chronic pain syndrome: Secondary | ICD-10-CM | POA: Diagnosis present

## 2015-02-13 HISTORY — PX: VIDEO ASSISTED THORACOSCOPY: SHX5073

## 2015-02-13 LAB — BLOOD GAS, ARTERIAL
ACID-BASE DEFICIT: 1 mmol/L (ref 0.0–2.0)
Bicarbonate: 23.5 mEq/L (ref 20.0–24.0)
FIO2: 0.21
O2 SAT: 97.1 %
PATIENT TEMPERATURE: 98.6
TCO2: 24.8 mmol/L (ref 0–100)
pCO2 arterial: 42 mmHg (ref 35.0–45.0)
pH, Arterial: 7.367 (ref 7.350–7.450)
pO2, Arterial: 97.2 mmHg (ref 80.0–100.0)

## 2015-02-13 LAB — CBC
HCT: 38.1 % (ref 36.0–46.0)
HEMOGLOBIN: 12.8 g/dL (ref 12.0–15.0)
MCH: 32.8 pg (ref 26.0–34.0)
MCHC: 33.6 g/dL (ref 30.0–36.0)
MCV: 97.7 fL (ref 78.0–100.0)
PLATELETS: 208 10*3/uL (ref 150–400)
RBC: 3.9 MIL/uL (ref 3.87–5.11)
RDW: 13.8 % (ref 11.5–15.5)
WBC: 8 10*3/uL (ref 4.0–10.5)

## 2015-02-13 SURGERY — VIDEO ASSISTED THORACOSCOPY
Anesthesia: General | Site: Chest | Laterality: Left

## 2015-02-13 MED ORDER — MIDAZOLAM HCL 2 MG/2ML IJ SOLN
INTRAMUSCULAR | Status: AC
  Filled 2015-02-13: qty 4

## 2015-02-13 MED ORDER — ASPIRIN EC 81 MG PO TBEC
81.0000 mg | DELAYED_RELEASE_TABLET | Freq: Every day | ORAL | Status: DC
  Administered 2015-02-14 – 2015-02-18 (×5): 81 mg via ORAL
  Filled 2015-02-13 (×6): qty 1

## 2015-02-13 MED ORDER — ONDANSETRON HCL 4 MG/2ML IJ SOLN
INTRAMUSCULAR | Status: DC | PRN
  Administered 2015-02-13: 4 mg via INTRAVENOUS

## 2015-02-13 MED ORDER — PROPOFOL 10 MG/ML IV BOLUS
INTRAVENOUS | Status: AC
  Filled 2015-02-13: qty 20

## 2015-02-13 MED ORDER — ROCURONIUM BROMIDE 100 MG/10ML IV SOLN
INTRAVENOUS | Status: DC | PRN
  Administered 2015-02-13: 30 mg via INTRAVENOUS
  Administered 2015-02-13: 10 mg via INTRAVENOUS
  Administered 2015-02-13: 30 mg via INTRAVENOUS
  Administered 2015-02-13: 20 mg via INTRAVENOUS
  Administered 2015-02-13: 50 mg via INTRAVENOUS

## 2015-02-13 MED ORDER — METOPROLOL TARTRATE 25 MG PO TABS
25.0000 mg | ORAL_TABLET | Freq: Two times a day (BID) | ORAL | Status: DC
  Administered 2015-02-14 – 2015-02-18 (×7): 25 mg via ORAL
  Filled 2015-02-13 (×12): qty 1

## 2015-02-13 MED ORDER — ALBUTEROL SULFATE (2.5 MG/3ML) 0.083% IN NEBU: 2.5000 mg | INHALATION_SOLUTION | RESPIRATORY_TRACT | Status: DC

## 2015-02-13 MED ORDER — BUPIVACAINE 0.5 % ON-Q PUMP SINGLE CATH 400 ML
400.0000 mL | INJECTION | Status: DC
  Filled 2015-02-13: qty 400

## 2015-02-13 MED ORDER — DICYCLOMINE HCL 20 MG PO TABS
20.0000 mg | ORAL_TABLET | Freq: Three times a day (TID) | ORAL | Status: DC
  Administered 2015-02-13 – 2015-02-18 (×9): 20 mg via ORAL
  Filled 2015-02-13 (×25): qty 1

## 2015-02-13 MED ORDER — ROCURONIUM BROMIDE 50 MG/5ML IV SOLN
INTRAVENOUS | Status: AC
  Filled 2015-02-13: qty 1

## 2015-02-13 MED ORDER — HYDROMORPHONE HCL 1 MG/ML IJ SOLN
INTRAMUSCULAR | Status: AC
  Administered 2015-02-13: 0.5 mg via INTRAVENOUS
  Filled 2015-02-13: qty 1

## 2015-02-13 MED ORDER — ONDANSETRON HCL 4 MG/2ML IJ SOLN
INTRAMUSCULAR | Status: AC
  Filled 2015-02-13: qty 2

## 2015-02-13 MED ORDER — PROPOFOL 10 MG/ML IV BOLUS
INTRAVENOUS | Status: DC | PRN
  Administered 2015-02-13: 200 mg via INTRAVENOUS

## 2015-02-13 MED ORDER — DIPHENHYDRAMINE HCL 50 MG/ML IJ SOLN
12.5000 mg | Freq: Four times a day (QID) | INTRAMUSCULAR | Status: DC | PRN
  Administered 2015-02-17: 12.5 mg via INTRAVENOUS

## 2015-02-13 MED ORDER — DEXTROSE 5 % IV SOLN
1.5000 g | Freq: Two times a day (BID) | INTRAVENOUS | Status: AC
  Administered 2015-02-13 – 2015-02-14 (×2): 1.5 g via INTRAVENOUS
  Filled 2015-02-13 (×3): qty 1.5

## 2015-02-13 MED ORDER — ACETAMINOPHEN 160 MG/5ML PO SOLN
1000.0000 mg | Freq: Four times a day (QID) | ORAL | Status: DC
  Filled 2015-02-13 (×3): qty 40

## 2015-02-13 MED ORDER — NEOSTIGMINE METHYLSULFATE 10 MG/10ML IV SOLN
INTRAVENOUS | Status: DC | PRN
  Administered 2015-02-13: 3 mg via INTRAVENOUS

## 2015-02-13 MED ORDER — BUPIVACAINE HCL (PF) 0.5 % IJ SOLN
INTRAMUSCULAR | Status: DC | PRN
  Administered 2015-02-13: 10 mL

## 2015-02-13 MED ORDER — BUPIVACAINE 0.5 % ON-Q PUMP SINGLE CATH 400 ML
INJECTION | Status: DC | PRN
  Administered 2015-02-13: 400 mL

## 2015-02-13 MED ORDER — FENTANYL 40 MCG/ML IV SOLN
INTRAVENOUS | Status: DC
  Administered 2015-02-13: 315 ug via INTRAVENOUS
  Administered 2015-02-14: 150 ug via INTRAVENOUS
  Administered 2015-02-14: 210 ug via INTRAVENOUS
  Administered 2015-02-14: 240 ug via INTRAVENOUS
  Administered 2015-02-14: 06:00:00 via INTRAVENOUS
  Administered 2015-02-14: 60 ug via INTRAVENOUS
  Administered 2015-02-14: 240 ug via INTRAVENOUS
  Administered 2015-02-14: 75 ug via INTRAVENOUS
  Administered 2015-02-14: 225 ug via INTRAVENOUS
  Administered 2015-02-15: 12:00:00 via INTRAVENOUS
  Administered 2015-02-15: 135 ug via INTRAVENOUS
  Administered 2015-02-15: 210 ug via INTRAVENOUS
  Administered 2015-02-15: 60 ug via INTRAVENOUS
  Administered 2015-02-15 (×2): 90 ug via INTRAVENOUS
  Administered 2015-02-16: 105 ug via INTRAVENOUS
  Administered 2015-02-16: 120 ug via INTRAVENOUS
  Administered 2015-02-16: 21:00:00 via INTRAVENOUS
  Administered 2015-02-16: 180 ug via INTRAVENOUS
  Administered 2015-02-16: 225 ug via INTRAVENOUS
  Administered 2015-02-16: 75 ug via INTRAVENOUS
  Administered 2015-02-16: 45 ug via INTRAVENOUS
  Administered 2015-02-17: 90 ug via INTRAVENOUS
  Administered 2015-02-17: 45 ug via INTRAVENOUS
  Administered 2015-02-17: 210 ug via INTRAVENOUS
  Filled 2015-02-13 (×3): qty 25

## 2015-02-13 MED ORDER — DIPHENHYDRAMINE HCL 12.5 MG/5ML PO ELIX
12.5000 mg | ORAL_SOLUTION | Freq: Four times a day (QID) | ORAL | Status: DC | PRN
  Filled 2015-02-13 (×2): qty 5

## 2015-02-13 MED ORDER — METOPROLOL TARTRATE 12.5 MG HALF TABLET
ORAL_TABLET | ORAL | Status: AC
  Administered 2015-02-13: 25 mg via ORAL
  Filled 2015-02-13: qty 2

## 2015-02-13 MED ORDER — AMLODIPINE BESYLATE 10 MG PO TABS
10.0000 mg | ORAL_TABLET | Freq: Every day | ORAL | Status: DC
  Administered 2015-02-14 – 2015-02-18 (×5): 10 mg via ORAL
  Filled 2015-02-13 (×5): qty 1

## 2015-02-13 MED ORDER — FAMOTIDINE 20 MG PO TABS
20.0000 mg | ORAL_TABLET | Freq: Two times a day (BID) | ORAL | Status: DC
  Administered 2015-02-13 – 2015-02-18 (×10): 20 mg via ORAL
  Filled 2015-02-13 (×14): qty 1

## 2015-02-13 MED ORDER — OXYCODONE HCL 5 MG PO TABS
5.0000 mg | ORAL_TABLET | ORAL | Status: DC | PRN
  Administered 2015-02-15 – 2015-02-18 (×9): 10 mg via ORAL
  Filled 2015-02-13 (×9): qty 2

## 2015-02-13 MED ORDER — BUPIVACAINE HCL (PF) 0.5 % IJ SOLN
INTRAMUSCULAR | Status: AC
  Filled 2015-02-13: qty 30

## 2015-02-13 MED ORDER — GABAPENTIN 300 MG PO CAPS
600.0000 mg | ORAL_CAPSULE | Freq: Three times a day (TID) | ORAL | Status: DC
  Administered 2015-02-14 – 2015-02-18 (×13): 600 mg via ORAL
  Filled 2015-02-13 (×18): qty 2

## 2015-02-13 MED ORDER — DIVALPROEX SODIUM 500 MG PO DR TAB
500.0000 mg | DELAYED_RELEASE_TABLET | Freq: Two times a day (BID) | ORAL | Status: DC
  Administered 2015-02-14 – 2015-02-18 (×9): 500 mg via ORAL
  Filled 2015-02-13 (×10): qty 1

## 2015-02-13 MED ORDER — LIDOCAINE HCL (CARDIAC) 20 MG/ML IV SOLN
INTRAVENOUS | Status: AC
  Filled 2015-02-13: qty 5

## 2015-02-13 MED ORDER — POTASSIUM CHLORIDE 10 MEQ/50ML IV SOLN: 10.0000 meq | Freq: Every day | INTRAVENOUS | Status: DC | PRN

## 2015-02-13 MED ORDER — FENTANYL CITRATE (PF) 250 MCG/5ML IJ SOLN
INTRAMUSCULAR | Status: AC
  Filled 2015-02-13: qty 5

## 2015-02-13 MED ORDER — SENNOSIDES-DOCUSATE SODIUM 8.6-50 MG PO TABS
1.0000 | ORAL_TABLET | Freq: Every day | ORAL | Status: DC
  Administered 2015-02-13 – 2015-02-17 (×5): 1 via ORAL
  Filled 2015-02-13 (×9): qty 1

## 2015-02-13 MED ORDER — MIDAZOLAM HCL 5 MG/5ML IJ SOLN
INTRAMUSCULAR | Status: DC | PRN
  Administered 2015-02-13 (×2): 1 mg via INTRAVENOUS

## 2015-02-13 MED ORDER — OXYCODONE HCL 5 MG/5ML PO SOLN: 5.0000 mg | Freq: Once | ORAL | Status: DC | PRN

## 2015-02-13 MED ORDER — KETOROLAC TROMETHAMINE 15 MG/ML IJ SOLN
INTRAMUSCULAR | Status: AC
  Filled 2015-02-13: qty 1

## 2015-02-13 MED ORDER — ACETAMINOPHEN 160 MG/5ML PO SOLN
325.0000 mg | ORAL | Status: DC | PRN
Start: 2015-02-13 — End: 2015-02-13

## 2015-02-13 MED ORDER — ACETAMINOPHEN 500 MG PO TABS
1000.0000 mg | ORAL_TABLET | Freq: Four times a day (QID) | ORAL | Status: DC
  Administered 2015-02-13 – 2015-02-15 (×5): 1000 mg via ORAL
  Administered 2015-02-15: 500 mg via ORAL
  Administered 2015-02-16 – 2015-02-18 (×6): 1000 mg via ORAL
  Filled 2015-02-13 (×21): qty 2

## 2015-02-13 MED ORDER — NALOXONE HCL 0.4 MG/ML IJ SOLN: 0.4000 mg | INTRAMUSCULAR | Status: DC | PRN

## 2015-02-13 MED ORDER — BISACODYL 5 MG PO TBEC
10.0000 mg | DELAYED_RELEASE_TABLET | Freq: Every day | ORAL | Status: DC
  Administered 2015-02-14 – 2015-02-18 (×5): 10 mg via ORAL
  Filled 2015-02-13 (×7): qty 2

## 2015-02-13 MED ORDER — METOPROLOL TARTRATE 25 MG PO TABS
25.0000 mg | ORAL_TABLET | Freq: Once | ORAL | Status: AC
  Administered 2015-02-13: 25 mg via ORAL
  Filled 2015-02-13: qty 1

## 2015-02-13 MED ORDER — TRAMADOL HCL 50 MG PO TABS: 50.0000 mg | ORAL_TABLET | Freq: Four times a day (QID) | ORAL | Status: DC | PRN

## 2015-02-13 MED ORDER — SIMVASTATIN 20 MG PO TABS
20.0000 mg | ORAL_TABLET | Freq: Every evening | ORAL | Status: DC
  Administered 2015-02-14 – 2015-02-17 (×4): 20 mg via ORAL
  Filled 2015-02-13 (×6): qty 1

## 2015-02-13 MED ORDER — ONDANSETRON HCL 4 MG/2ML IJ SOLN: 4.0000 mg | Freq: Four times a day (QID) | INTRAMUSCULAR | Status: DC | PRN

## 2015-02-13 MED ORDER — SERTRALINE HCL 100 MG PO TABS
100.0000 mg | ORAL_TABLET | Freq: Every day | ORAL | Status: DC
  Administered 2015-02-14 – 2015-02-18 (×5): 100 mg via ORAL
  Filled 2015-02-13 (×5): qty 1

## 2015-02-13 MED ORDER — ACETAMINOPHEN 160 MG/5ML PO SOLN: 1000.0000 mg | Freq: Four times a day (QID) | ORAL | Status: DC

## 2015-02-13 MED ORDER — MIRTAZAPINE 15 MG PO TABS
15.0000 mg | ORAL_TABLET | Freq: Every day | ORAL | Status: DC
  Administered 2015-02-13 – 2015-02-17 (×5): 15 mg via ORAL
  Filled 2015-02-13 (×9): qty 1

## 2015-02-13 MED ORDER — ARTIFICIAL TEARS OP OINT
TOPICAL_OINTMENT | OPHTHALMIC | Status: AC
  Filled 2015-02-13: qty 3.5

## 2015-02-13 MED ORDER — ONDANSETRON HCL 4 MG/2ML IJ SOLN
4.0000 mg | Freq: Four times a day (QID) | INTRAMUSCULAR | Status: DC | PRN
Start: 2015-02-13 — End: 2015-02-17

## 2015-02-13 MED ORDER — 0.9 % SODIUM CHLORIDE (POUR BTL) OPTIME
TOPICAL | Status: DC | PRN
  Administered 2015-02-13: 2000 mL

## 2015-02-13 MED ORDER — ALBUTEROL SULFATE (2.5 MG/3ML) 0.083% IN NEBU
2.5000 mg | INHALATION_SOLUTION | RESPIRATORY_TRACT | Status: DC
  Administered 2015-02-13 – 2015-02-15 (×9): 2.5 mg via RESPIRATORY_TRACT
  Filled 2015-02-13 (×9): qty 3

## 2015-02-13 MED ORDER — FENTANYL 40 MCG/ML IV SOLN
INTRAVENOUS | Status: DC
  Administered 2015-02-13: 30 ug via INTRAVENOUS
  Filled 2015-02-13: qty 25

## 2015-02-13 MED ORDER — ACETAMINOPHEN 325 MG PO TABS: 325.0000 mg | ORAL_TABLET | ORAL | Status: DC | PRN

## 2015-02-13 MED ORDER — HYDROMORPHONE HCL 1 MG/ML IJ SOLN
0.2500 mg | INTRAMUSCULAR | Status: DC | PRN
  Administered 2015-02-13 (×4): 0.5 mg via INTRAVENOUS

## 2015-02-13 MED ORDER — LACTATED RINGERS IV SOLN
INTRAVENOUS | Status: DC | PRN
  Administered 2015-02-13 (×2): via INTRAVENOUS

## 2015-02-13 MED ORDER — KETOROLAC TROMETHAMINE 15 MG/ML IJ SOLN
15.0000 mg | Freq: Four times a day (QID) | INTRAMUSCULAR | Status: AC
  Administered 2015-02-13 – 2015-02-18 (×20): 15 mg via INTRAVENOUS
  Filled 2015-02-13 (×24): qty 1

## 2015-02-13 MED ORDER — OXYCODONE HCL 5 MG PO TABS: 5.0000 mg | ORAL_TABLET | Freq: Once | ORAL | Status: DC | PRN

## 2015-02-13 MED ORDER — FENTANYL CITRATE (PF) 100 MCG/2ML IJ SOLN
INTRAMUSCULAR | Status: DC | PRN
  Administered 2015-02-13 (×2): 100 ug via INTRAVENOUS
  Administered 2015-02-13 (×3): 50 ug via INTRAVENOUS
  Administered 2015-02-13: 100 ug via INTRAVENOUS

## 2015-02-13 MED ORDER — SODIUM CHLORIDE 0.9 % IJ SOLN: 9.0000 mL | INTRAMUSCULAR | Status: DC | PRN

## 2015-02-13 MED ORDER — GLYCOPYRROLATE 0.2 MG/ML IJ SOLN
INTRAMUSCULAR | Status: DC | PRN
  Administered 2015-02-13: 0.4 mg via INTRAVENOUS

## 2015-02-13 MED ORDER — DEXTROSE-NACL 5-0.9 % IV SOLN
INTRAVENOUS | Status: DC
  Administered 2015-02-13 – 2015-02-14 (×3): via INTRAVENOUS

## 2015-02-13 SURGICAL SUPPLY — 93 items
ADH SKN CLS APL DERMABOND .7 (GAUZE/BANDAGES/DRESSINGS)
APL SKNCLS STERI-STRIP NONHPOA (GAUZE/BANDAGES/DRESSINGS)
BAG SPEC RTRVL LRG 6X4 10 (ENDOMECHANICALS) ×1
BENZOIN TINCTURE PRP APPL 2/3 (GAUZE/BANDAGES/DRESSINGS) IMPLANT
CANISTER SUCTION 2500CC (MISCELLANEOUS) ×3 IMPLANT
CATH KIT ON Q 10IN SLV (PAIN MANAGEMENT) ×3 IMPLANT
CATH KIT ON Q 5IN SLV (PAIN MANAGEMENT) IMPLANT
CATH THORACIC 28FR (CATHETERS) IMPLANT
CATH THORACIC 36FR (CATHETERS) IMPLANT
CATH THORACIC 36FR RT ANG (CATHETERS) IMPLANT
CLIP TI MEDIUM 6 (CLIP) IMPLANT
CONN ST 1/4X3/8  BEN (MISCELLANEOUS)
CONN ST 1/4X3/8 BEN (MISCELLANEOUS) IMPLANT
CONN Y 3/8X3/8X3/8  BEN (MISCELLANEOUS)
CONN Y 3/8X3/8X3/8 BEN (MISCELLANEOUS) IMPLANT
CONT SPEC 4OZ CLIKSEAL STRL BL (MISCELLANEOUS) ×27 IMPLANT
COVER SURGICAL LIGHT HANDLE (MISCELLANEOUS) ×3 IMPLANT
CUTTER ECHEON FLEX ENDO 45 340 (ENDOMECHANICALS) ×15 IMPLANT
DERMABOND ADVANCED (GAUZE/BANDAGES/DRESSINGS)
DERMABOND ADVANCED .7 DNX12 (GAUZE/BANDAGES/DRESSINGS) IMPLANT
DRAIN CHANNEL 28F RND 3/8 FF (WOUND CARE) IMPLANT
DRAIN CHANNEL 32F RND 10.7 FF (WOUND CARE) IMPLANT
DRAPE LAPAROSCOPIC ABDOMINAL (DRAPES) ×3 IMPLANT
DRAPE SLUSH/WARMER DISC (DRAPES) ×3 IMPLANT
DRAPE WARM FLUID 44X44 (DRAPE) IMPLANT
ELECT BLADE 6.5 EXT (BLADE) ×3 IMPLANT
ELECT REM PT RETURN 9FT ADLT (ELECTROSURGICAL) ×3
ELECTRODE REM PT RTRN 9FT ADLT (ELECTROSURGICAL) ×2 IMPLANT
GAUZE SPONGE 4X4 12PLY STRL (GAUZE/BANDAGES/DRESSINGS) ×3 IMPLANT
GLOVE BIO SURGEON STRL SZ 6.5 (GLOVE) ×3 IMPLANT
GLOVE SURG SIGNA 7.5 PF LTX (GLOVE) ×6 IMPLANT
GOWN STRL REUS W/ TWL LRG LVL3 (GOWN DISPOSABLE) ×4 IMPLANT
GOWN STRL REUS W/ TWL XL LVL3 (GOWN DISPOSABLE) ×4 IMPLANT
GOWN STRL REUS W/TWL LRG LVL3 (GOWN DISPOSABLE) ×6
GOWN STRL REUS W/TWL XL LVL3 (GOWN DISPOSABLE) ×4
HANDLE STAPLE ENDO GIA SHORT (STAPLE)
HEMOSTAT SURGICEL 2X14 (HEMOSTASIS) IMPLANT
KIT BASIN OR (CUSTOM PROCEDURE TRAY) ×3 IMPLANT
KIT ROOM TURNOVER OR (KITS) ×3 IMPLANT
KIT SUCTION CATH 14FR (SUCTIONS) IMPLANT
NS IRRIG 1000ML POUR BTL (IV SOLUTION) ×6 IMPLANT
PACK CHEST (CUSTOM PROCEDURE TRAY) ×3 IMPLANT
PAD ARMBOARD 7.5X6 YLW CONV (MISCELLANEOUS) ×6 IMPLANT
POUCH ENDO CATCH II 15MM (MISCELLANEOUS) IMPLANT
POUCH SPECIMEN RETRIEVAL 10MM (ENDOMECHANICALS) ×3 IMPLANT
RELOAD GOLD ECHELON 45 (STAPLE) ×24 IMPLANT
RELOAD GREEN ECHELON 45 (STAPLE) ×12 IMPLANT
SCISSORS ENDO CVD 5DCS (MISCELLANEOUS) IMPLANT
SEALANT PROGEL (MISCELLANEOUS) ×3 IMPLANT
SEALANT SURG COSEAL 4ML (VASCULAR PRODUCTS) IMPLANT
SEALANT SURG COSEAL 8ML (VASCULAR PRODUCTS) IMPLANT
SOLUTION ANTI FOG 6CC (MISCELLANEOUS) ×3 IMPLANT
SPECIMEN JAR MEDIUM (MISCELLANEOUS) ×3 IMPLANT
SPONGE GAUZE 4X4 12PLY STER LF (GAUZE/BANDAGES/DRESSINGS) ×3 IMPLANT
SPONGE INTESTINAL PEANUT (DISPOSABLE) ×3 IMPLANT
SPONGE TONSIL 1 RF SGL (DISPOSABLE) ×3 IMPLANT
STAPLE RELOAD 2.5MM WHITE (STAPLE) ×3 IMPLANT
STAPLER ENDO GIA 12MM SHORT (STAPLE) IMPLANT
STAPLER VASCULAR ECHELON 35 (CUTTER) ×3 IMPLANT
SUT PROLENE 4 0 RB 1 (SUTURE)
SUT PROLENE 4-0 RB1 .5 CRCL 36 (SUTURE) IMPLANT
SUT SILK  1 MH (SUTURE) ×2
SUT SILK 1 MH (SUTURE) ×4 IMPLANT
SUT SILK 1 TIES 10X30 (SUTURE) IMPLANT
SUT SILK 2 0 SH (SUTURE) IMPLANT
SUT SILK 2 0SH CR/8 30 (SUTURE) IMPLANT
SUT SILK 3 0 SH 30 (SUTURE) IMPLANT
SUT SILK 3 0SH CR/8 30 (SUTURE) ×3 IMPLANT
SUT VIC AB 0 CTX 27 (SUTURE) IMPLANT
SUT VIC AB 1 CTX 27 (SUTURE) ×3 IMPLANT
SUT VIC AB 2-0 CT1 27 (SUTURE)
SUT VIC AB 2-0 CT1 TAPERPNT 27 (SUTURE) IMPLANT
SUT VIC AB 2-0 CTX 27 (SUTURE) ×3 IMPLANT
SUT VIC AB 2-0 CTX 36 (SUTURE) ×3 IMPLANT
SUT VIC AB 3-0 MH 27 (SUTURE) IMPLANT
SUT VIC AB 3-0 SH 27 (SUTURE) ×9
SUT VIC AB 3-0 SH 27X BRD (SUTURE) ×6 IMPLANT
SUT VIC AB 3-0 X1 27 (SUTURE) ×3 IMPLANT
SUT VICRYL 0 UR6 27IN ABS (SUTURE) ×6 IMPLANT
SUT VICRYL 2 TP 1 (SUTURE) IMPLANT
SWAB COLLECTION DEVICE MRSA (MISCELLANEOUS) IMPLANT
SYSTEM SAHARA CHEST DRAIN ATS (WOUND CARE) ×3 IMPLANT
TAPE CLOTH SURG 4X10 WHT LF (GAUZE/BANDAGES/DRESSINGS) ×3 IMPLANT
TIP APPLICATOR SPRAY EXTEND 16 (VASCULAR PRODUCTS) ×3 IMPLANT
TOWEL OR 17X24 6PK STRL BLUE (TOWEL DISPOSABLE) ×3 IMPLANT
TOWEL OR 17X26 10 PK STRL BLUE (TOWEL DISPOSABLE) ×6 IMPLANT
TRAP SPECIMEN MUCOUS 40CC (MISCELLANEOUS) IMPLANT
TRAY FOLEY CATH 16FRSI W/METER (SET/KITS/TRAYS/PACK) ×3 IMPLANT
TROCAR XCEL BLADELESS 5X75MML (TROCAR) ×3 IMPLANT
TROCAR XCEL NON-BLD 5MMX100MML (ENDOMECHANICALS) IMPLANT
TUBE ANAEROBIC SPECIMEN COL (MISCELLANEOUS) IMPLANT
TUNNELER SHEATH ON-Q 11GX8 DSP (PAIN MANAGEMENT) ×3 IMPLANT
WATER STERILE IRR 1000ML POUR (IV SOLUTION) ×3 IMPLANT

## 2015-02-13 NOTE — Brief Op Note (Addendum)
02/13/2015  12:19 PM       [address]       [place] [address]             [phone]     02/13/2015  12:22 PM  PATIENT:  Brenda [name]  54 y.o. female  PRE-OPERATIVE DIAGNOSIS:  LEFT LOWER LOBE LUNG NODULE  POST-OPERATIVE DIAGNOSIS:  Adenocarcinoma with lepidic spread  PROCEDURE:  Procedure(s): LEFT VIDEO ASSISTED THORACOSCOPY LEFT LOWER LOBE ANTERIOR BASILAR  SEGMENTECTOMY LYMPH NODE SAMPLING PLACEMENT OF ON-Q LOCAL ANESTHETIC CATHETER  SURGEON:  Surgeon(s): Melrose Nakayama, MD  PHYSICIAN ASSISTANT: WAYNE GOLD PA-C  ANESTHESIA:   general  SPECIMEN:  Anterior segment LLL, multiple lymph nodes  DISPOSITION OF SPECIMEN:  Pathology  DRAINS: 1 Chest Tube(s) in the LEFT HEMITHORAX   EBL: 757 ML  COMPLICATIONS: NO KNOWN PATIENT CONDITION:  PACU - hemodynamically stable.  PRE-OPERATIVE WEIGHT: 55kg  FINDINGS: adenocarcinoma with lepidic spread, margins negative

## 2015-02-13 NOTE — Transfer of Care (Signed)
Immediate Anesthesia Transfer of Care Note  Patient: Brenda [name]  Procedure(s) Performed: Procedure(s): VIDEO ASSISTED THORACOSCOPY, ANTERIOR BASAL SEGMENTECTOMY (Left)  Patient Location: PACU  Anesthesia Type:General  Level of Consciousness: patient cooperative, lethargic and responds to stimulation  Airway & Oxygen Therapy: Patient Spontanous Breathing and Patient connected to nasal cannula oxygen  Post-op Assessment: Report given to RN  Post vital signs: Reviewed and stable  Last Vitals:  Filed Vitals:   02/13/15 0827  BP:   Pulse: 56  Temp:   Resp: 18    Complications: No apparent anesthesia complications

## 2015-02-13 NOTE — Interval H&P Note (Signed)
History and Physical Interval Note:  Cleared by Cardiology PFTs acceptable  02/13/2015 8:03 AM  [name]  has presented today for surgery, with the diagnosis of LLL LUNG NODULE  The various methods of treatment have been discussed with the patient and family. After consideration of risks, benefits and other options for treatment, the patient has consented to  Procedure(s): VIDEO ASSISTED THORACOSCOPY (VATS)/WEDGE RESECTION (Left) POSSIBLE SEGMENTECTOMY (Left) as a surgical intervention .  The patient's history has been reviewed, patient examined, no change in status, stable for surgery.  I have reviewed the patient's chart and labs.  Questions were answered to the patient's satisfaction.     Brenda [name]

## 2015-02-13 NOTE — Anesthesia Preprocedure Evaluation (Addendum)
Anesthesia Evaluation  Patient identified by MRN, date of birth, ID band Patient awake    Reviewed: Allergy & Precautions, NPO status , Patient's Chart, lab work & pertinent test results, reviewed documented beta blocker date and time   History of Anesthesia Complications Negative for: history of anesthetic complications  Airway Mallampati: II  TM Distance: >3 FB Neck ROM: Full    Dental  (+) Teeth Intact, Dental Advisory Given   Pulmonary shortness of breath, neg sleep apnea, neg COPD, neg recent URI, Current Smoker, former smoker,    breath sounds clear to auscultation       Cardiovascular hypertension, Pt. on medications (-) angina+ CAD, + Past MI and +CHF   Rhythm:Regular Rate:Normal     Neuro/Psych PSYCHIATRIC DISORDERS Anxiety Depression Bipolar Disorder TIACVA, No Residual Symptoms    GI/Hepatic Neg liver ROS, GERD  Medicated and Controlled,  Endo/Other    Renal/GU negative Renal ROS     Musculoskeletal  (+) Arthritis , Osteoarthritis,    Abdominal   Peds  Hematology  (+) anemia ,   Anesthesia Other Findings   Reproductive/Obstetrics                            Anesthesia Physical Anesthesia Plan  ASA: III  Anesthesia Plan: General   Post-op Pain Management:    Induction: Intravenous  Airway Management Planned: Double Lumen EBT  Additional Equipment: Arterial line  Intra-op Plan:   Post-operative Plan: Extubation in OR  Informed Consent: I have reviewed the patients History and Physical, chart, labs and discussed the procedure including the risks, benefits and alternatives for the proposed anesthesia with the patient or authorized representative who has indicated his/her understanding and acceptance.   Dental advisory given  Plan Discussed with: CRNA and Surgeon  Anesthesia Plan Comments:         Anesthesia Quick Evaluation
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
## 2015-02-13 NOTE — Anesthesia Procedure Notes (Signed)
Procedure Name: Intubation Date/Time: 02/13/2015 8:47 AM Performed by: Barrington Ellison Pre-anesthesia Checklist: Patient identified, Emergency Drugs available, Suction available, Patient being monitored and Timeout performed Patient Re-evaluated:Patient Re-evaluated prior to inductionOxygen Delivery Method: Circle system utilized Preoxygenation: Pre-oxygenation with 100% oxygen Intubation Type: IV induction Ventilation: Mask ventilation without difficulty Laryngoscope Size: Mac and 3 Grade View: Grade I Tube type: Oral Endobronchial tube: Left and Double lumen EBT and 37 Fr Number of attempts: 1 Airway Equipment and Method: Stylet and Fiberoptic brochoscope Placement Confirmation: ETT inserted through vocal cords under direct vision,  positive ETCO2 and breath sounds checked- equal and bilateral Secured at: 28 cm Tube secured with: Tape Dental Injury: Teeth and Oropharynx as per pre-operative assessment

## 2015-02-14 ENCOUNTER — Inpatient Hospital Stay (HOSPITAL_COMMUNITY): Payer: Self-pay

## 2015-02-14 ENCOUNTER — Encounter (HOSPITAL_COMMUNITY): Payer: Self-pay | Admitting: Thoracic Surgery (Cardiothoracic Vascular Surgery)

## 2015-02-14 LAB — CBC
HCT: 34.3 % — ABNORMAL LOW (ref 36.0–46.0)
Hemoglobin: 11.2 g/dL — ABNORMAL LOW (ref 12.0–15.0)
MCH: 32 pg (ref 26.0–34.0)
MCHC: 32.7 g/dL (ref 30.0–36.0)
MCV: 98 fL (ref 78.0–100.0)
PLATELETS: 188 10*3/uL (ref 150–400)
RBC: 3.5 MIL/uL — AB (ref 3.87–5.11)
RDW: 14 % (ref 11.5–15.5)
WBC: 9.6 10*3/uL (ref 4.0–10.5)

## 2015-02-14 LAB — BLOOD GAS, ARTERIAL
Acid-Base Excess: 0.2 mmol/L (ref 0.0–2.0)
BICARBONATE: 25 meq/L — AB (ref 20.0–24.0)
Drawn by: 364961
FIO2: 0.28
O2 Saturation: 93.3 %
PH ART: 7.366 (ref 7.350–7.450)
PO2 ART: 69.6 mmHg — AB (ref 80.0–100.0)
Patient temperature: 98.2
TCO2: 26.4 mmol/L (ref 0–100)
pCO2 arterial: 44.6 mmHg (ref 35.0–45.0)

## 2015-02-14 LAB — BASIC METABOLIC PANEL
Anion gap: 4 — ABNORMAL LOW (ref 5–15)
BUN: 10 mg/dL (ref 6–20)
CHLORIDE: 110 mmol/L (ref 101–111)
CO2: 26 mmol/L (ref 22–32)
CREATININE: 0.66 mg/dL (ref 0.44–1.00)
Calcium: 8.3 mg/dL — ABNORMAL LOW (ref 8.9–10.3)
GFR calc Af Amer: >60 mL/min (ref >60)
GFR calc non Af Amer: >60 mL/min (ref >60)
GLUCOSE: 128 mg/dL — AB (ref 65–99)
POTASSIUM: 3.8 mmol/L (ref 3.5–5.1)
Sodium: 140 mmol/L (ref 135–145)

## 2015-02-14 MED ORDER — CYCLOBENZAPRINE HCL 10 MG PO TABS
5.0000 mg | ORAL_TABLET | Freq: Three times a day (TID) | ORAL | Status: DC | PRN
  Administered 2015-02-14: 5 mg via ORAL
  Filled 2015-02-14: qty 1

## 2015-02-14 MED ORDER — HYDROCODONE-ACETAMINOPHEN 5-325 MG PO TABS
1.0000 | ORAL_TABLET | Freq: Four times a day (QID) | ORAL | Status: DC | PRN
  Administered 2015-02-14 – 2015-02-17 (×8): 2 via ORAL
  Filled 2015-02-14 (×10): qty 2

## 2015-02-14 NOTE — Progress Notes (Addendum)
[address]       [address],[place] [address]             [phone]          1 Day Post-Op Procedure(s) (LRB): VIDEO ASSISTED THORACOSCOPY, ANTERIOR BASAL SEGMENTECTOMY (Left)  Subjective: Having a lot of pain and doesn't feel that PCA is helping much. Breathing stable, sats 97% on RA.   Objective: Vital signs in last 24 hours: Patient Vitals for the past 24 hrs:  BP Temp Temp src Pulse Resp SpO2 Height Weight  02/14/15 0728 - 98.5 F (36.9 C) Oral - - - - -  02/14/15 0355 (!) 156/79 mmHg - - 63 13 96 % - -  02/14/15 0340 - 98.2 F (36.8 C) Oral 73 18 95 % - -  02/13/15 2320 (!) 155/75 mmHg 98.3 F (36.8 C) Oral 70 14 97 % - -  02/13/15 2057 (!) 169/72 mmHg - - 65 15 98 % - -  02/13/15 2000 (!) 166/80 mmHg - - 66 15 98 % - -  02/13/15 1959 (!) 166/80 mmHg (!) 100.4 F (38 C) Oral 75 13 98 % - -  02/13/15 1850 (!) 166/82 mmHg - - 86 15 99 % - -  02/13/15 1540 (!) 158/82 mmHg - - 89 16 100 % '5\' 3"'$  (1.6 m) 134 lb 7.7 oz (61 kg)  02/13/15 1515 - 98.5 F (36.9 C) - (!) 57 15 100 % - -  02/13/15 1500 - - - 84 13 100 % - -  02/13/15 1445 - - - (!) 59 (!) 8 100 % - -  02/13/15 1430 - - - (!) 57 14 100 % - -  02/13/15 1415 - - - (!) 57 15 100 % - -  02/13/15 1410 140/76 mmHg 98.3 F (36.8 C) Oral (!) 53 15 100 % - -  02/13/15 1400 - - - (!) 58 13 100 % - -  02/13/15 1345 - - - (!) 52 17 100 % - -  02/13/15 1330 - - - (!) 50 18 100 % - -  02/13/15 1315 - - - (!) 49 14 100 % - -  02/13/15 1300 - - - (!) 52 13 100 % - -  02/13/15 1245 - - - (!) 51 12 100 % - -  02/13/15 1233 136/84 mmHg 97.9 F (36.6 C) - (!) 52 14 100 % - -  02/13/15 0827 - - - (!) 56 18 97 % - -  02/13/15 0826 - - - (!) 58 13 97 % - -  02/13/15 0825 - - - 60 14 97 % - -  02/13/15 0824 - - - 67 (!) 22 97 % - -  02/13/15 0823 - - - 63 16 98 % - -  02/13/15 0822 - - - 69 20 98 % - -  02/13/15 0821 - - - (!) 57 19 96 % - -  02/13/15 0820 - - - (!) 53 16 96 % - -  02/13/15 0819 - - - (!)  52 (!) 9 97 % - -  02/13/15 0818 - - - (!) 57 12 95 % - -  02/13/15 0817 - - - (!) 57 16 96 % - -  02/13/15 0816 - - - (!) 56 16 96 % - -  02/13/15 0815 - - - (!) 55 15 96 % - -  02/13/15 0814 - - - (!) 56 13 96 % - -  02/13/15 0813 - - - (!) 53 12 96 % - -   Current Weight  02/13/15 134 lb 7.7 oz (61 kg)     Intake/Output from previous day: 11/02 0701 - 11/03 0700 In: 2020.8 [I.V.:1970.8; IV Piggyback:50] Out: 2133 [Urine:1685; Blood:150; Chest Tube:298]    PHYSICAL EXAM:  Heart: RRR Lungs: Diminished BS in bases  Wound: Dressed and dry, there has been some serosanguinous oozing from On-Q overnight, but does not appear to be draining presently Chest tube: No air leak    Lab Results: CBC: Recent Labs  02/13/15 0752 02/14/15 0400  WBC 8.0 9.6  HGB 12.8 11.2*  HCT 38.1 34.3*  PLT 208 188   BMET:  Recent Labs  02/11/15 1100 02/14/15 0400  NA 138 140  K 4.5 3.8  CL 106 110  CO2 19* 26  GLUCOSE 106* 128*  BUN 18 10  CREATININE 0.61 0.66  CALCIUM 9.5 8.3*    PT/INR:  Recent Labs  02/11/15 1100  LABPROT 13.5  INR 1.01   CXR: FINDINGS: The lungs are well-expanded. There is no pneumothorax or significant pleural effusion on the left. The left-sided chest tube tip projects over the posterior aspect of the sixth rib. There is minimal subsegmental atelectasis at both bases which has improved. The pulmonary interstitial markings have become nearly normal. The cardiac silhouette is top-normal in size. The pulmonary vascularity is not engorged.  IMPRESSION: Interval improvement in the appearance of the pulmonary interstitium consistent with resolving edema. There is minimal bibasilar subsegmental atelectasis. There is no pneumothorax or significant pleural effusion.   Assessment/Plan: S/P Procedure(s) (LRB): VIDEO ASSISTED THORACOSCOPY, ANTERIOR BASAL SEGMENTECTOMY (Left) CT with no air leak, CXR stable with no ptx. Will place CT to water seal. Pain  control seems to be the biggest issue. Will continue PCA, prn oral meds and scheduled Toradol. Hopefully this will improve once CT d/c'ed. Resume home prn Flexeril, Norco. Decrease IVF, d/c a-line, leave Foley one more day. Pulm toilet, IS.   LOS: 1 day    COLLINS,Franci H 02/14/2015  Patient seen and examined, agree with above Pain control is an issue  PATH- T1a,N0 stage IA adenocarcinoma Patient informed of pathology  Remo Lipps C. Roxan Hockey, MD [facility] [phone]

## 2015-02-14 NOTE — Op Note (Signed)
NAMEYULEIMY, KRETZ NO.:  [id number]  MEDICAL RECORD NO.:  [id number]  LOCATION:  3S05C                        FACILITY:  [facility]  PHYSICIAN:  Revonda Standard. Roxan Hockey, M.D.DATE OF BIRTH:  Apr 24, 1960  DATE OF PROCEDURE:  02/13/2015 DATE OF DISCHARGE:                              OPERATIVE REPORT   PREOPERATIVE DIAGNOSIS:  Left lower lobe lung nodule.  POSTOPERATIVE DIAGNOSIS:  Adenocarcinoma with lepidic spread, left lower lobe.  PROCEDURE:   Left video-assisted thoracoscopy Left lower lobe anterior basilar segmentectomy Lymph node sampling Placement of On-Q local anesthetic catheter.  SURGEON:  Revonda Standard. Roxan Hockey, M.D.  FIRST ASSISTANT:  John Giovanni, PA-C.  ANESTHESIA:  General.  FINDINGS:  1.5 cm nodule in the anterior basilar segment of the left lower lobe.  Frozen section revealed adenocarcinoma with lepidic spread, margins clear.  CLINICAL NOTE:  Mrs. Elsey is a 54 year old woman with a history of tobacco abuse, who was originally found to have a left lower lobe lung nodule back in 2009.  This had been followed with CT since that time. It recently has grown and changed from a pure ground-glass opacity to a mixed subsolid lesion.  A PET CT was done and the nodule was not hypermetabolic.  She was advised to undergo surgical resection due to the continued growth and the high probability that this could be a low-grade adenocarcinoma.  The indications, risks, benefits, and alternatives were discussed in detail with the patient.  She understood and accepted the risks and agreed to proceed.  DESCRIPTION OF PROCEDURE:  Mrs. Auston was brought to the preoperative holding area on February 13, 2015.  Anesthesia placed a central line and an arterial blood pressure monitoring line.  She was taken to the operating room, anesthetized, and intubated with a double-lumen endotracheal tube. Sequential compression devices were placed on the calves for  DVT prophylaxis.  A Foley catheter was placed.  Intravenous antibiotics were administered.  She was placed in a right lateral decubitus position, and the left chest was prepped and draped in the usual sterile fashion.  Single lung ventilation of the right lung was initiated and was tolerated well throughout the procedure.  An incision was made in the seventh intercostal space in the midaxillary Line. A 5 mm port was inserted into the chest. The thoracoscope was advanced into the chest. Initially, there was slow deflation of the lung, but when suction was applied, the left lung deflated nicely and there was no cross ventilation.  There was good visualization.  A 5 cm working incision was made in the fourth interspace anterolaterally.  No rib spreading was performed during the procedure. A second port incision was made anterior to the first.  The left lower lobe was inspected.  The nodule was palpable in the anterior portion of the lower lobe along the major fissure.  It was difficult to determine whether a wedge resection would be feasible without significant distortion of the lower lobe anatomy.  The inferior ligament was divided with electrocautery.  Multiple lymph nodes were removed.  All lymph nodes that were encountered during the dissection were sent as separate specimens to Pathology.  The pleural reflection was divided at  the hilum anteriorly.  The fissure between the lingula and the lower lobe was completed with a single firing of endoscopic GIA stapler.  Inspection at this point revealed that it would be best to proceed with the segmentectomy incorporating the lesion to ensure an adequate margin and that would be definitive treatment should the nodule turn out to be an adenocarcinoma.  The basilar pulmonary arterial branches were dissected out.  The anterior branch was identified, encircled, and divided with an endoscopic stapler.  The bronchus then was identified.  Nodes around the  bronchus were removed.  The bronchus was encircled and divided with the endoscopic stapler using a green cartridge.  The stapler was placed on the bronchus and closed.  A test inflation showed good aeration of the upper lobe and the remainder of the lower lobe segments.  The stapler then was fired transecting the bronchus.  The segmentectomy was completed with sequential firings of the stapler using yellow cartridges.  The specimen was placed into an endoscopic retrieval bag, removed through the incision and sent for frozen section of the nodule and the margin.  Frozen section of the nodule revealed adenocarcinoma with lepidic spread.  The margins were clear.  The chest was copiously irrigated with warm saline.  Test inflation showed some air leakage along the parenchyma of the lower lobe.  This area was repaired with 3-0 Vicryl suture and Progel was applied.  An On- Q local anesthetic catheter was tunneled through a separate incision posteriorly and tunneled into a subpleural location, it was primed with 5 mL of 0.5% Marcaine.  After making a final inspection for hemostasis, a 28-French chest tube was placed through the original port incision and directed to the apex.  It was secured at the skin with a #1 silk suture. The left lung was reinflated, and there was good aeration of the entire lung.  The incision then was closed with a running #1 Vicryl fascial suture followed by a 2-0 Vicryl subcutaneous suture and a 3-0 Vicryl subcuticular suture.  The second port incision was closed with a 3-0 Vicryl subcuticular suture.   All sponge, needle, and instrument counts were correct at the end of the procedure.  The patient was placed back in a supine position.  She was extubated in the operating room and taken to the postanesthetic care unit in good condition.     Revonda Standard Roxan Hockey, M.D.     SCH/MEDQ  D:  02/13/2015  T:  02/14/2015  Job:  [id number]

## 2015-02-14 NOTE — Progress Notes (Signed)
RT NOTE: I spent time last night as well as tonight instructing the patient with IS use. I explained this is very beneficial for her. Her continues to say it hurts, it makes me cough. I told her that is normal. She said she will do her best to do this often.

## 2015-02-15 ENCOUNTER — Inpatient Hospital Stay (HOSPITAL_COMMUNITY): Payer: Self-pay

## 2015-02-15 LAB — COMPREHENSIVE METABOLIC PANEL
ALBUMIN: 3.2 g/dL — AB (ref 3.5–5.0)
ALT: 11 U/L — ABNORMAL LOW (ref 14–54)
ANION GAP: 8 (ref 5–15)
AST: 24 U/L (ref 15–41)
Alkaline Phosphatase: 64 U/L (ref 38–126)
BUN: <5 mg/dL — ABNORMAL LOW (ref 6–20)
CALCIUM: 9.2 mg/dL (ref 8.9–10.3)
CHLORIDE: 104 mmol/L (ref 101–111)
CO2: 29 mmol/L (ref 22–32)
Creatinine, Ser: 0.58 mg/dL (ref 0.44–1.00)
GFR calc Af Amer: >60 mL/min (ref >60)
GFR calc non Af Amer: >60 mL/min (ref >60)
GLUCOSE: 104 mg/dL — AB (ref 65–99)
POTASSIUM: 4.1 mmol/L (ref 3.5–5.1)
SODIUM: 141 mmol/L (ref 135–145)
Total Bilirubin: 0.5 mg/dL (ref 0.3–1.2)
Total Protein: 6.8 g/dL (ref 6.5–8.1)

## 2015-02-15 LAB — CBC
HCT: 37.5 % (ref 36.0–46.0)
Hemoglobin: 12.3 g/dL (ref 12.0–15.0)
MCH: 32.7 pg (ref 26.0–34.0)
MCHC: 32.8 g/dL (ref 30.0–36.0)
MCV: 99.7 fL (ref 78.0–100.0)
Platelets: 198 10*3/uL (ref 150–400)
RBC: 3.76 MIL/uL — ABNORMAL LOW (ref 3.87–5.11)
RDW: 14.1 % (ref 11.5–15.5)
WBC: 11.8 10*3/uL — ABNORMAL HIGH (ref 4.0–10.5)

## 2015-02-15 LAB — TOXASSURE SELECT,+ANTIDEPR,UR: PDF: 0

## 2015-02-15 MED ORDER — LISINOPRIL 5 MG PO TABS
5.0000 mg | ORAL_TABLET | Freq: Every day | ORAL | Status: DC
  Administered 2015-02-15 – 2015-02-16 (×2): 5 mg via ORAL
  Filled 2015-02-15 (×2): qty 1
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
## 2015-02-15 NOTE — Progress Notes (Addendum)
       [address]       [place] [address]             [phone]          2 Days Post-Op Procedure(s) (LRB): VIDEO ASSISTED THORACOSCOPY, ANTERIOR BASAL SEGMENTECTOMY (Left)  Subjective: Still sore, but otherwise stable. Walked in halls without difficulty, appetite good.   Objective: Vital signs in last 24 hours: Patient Vitals for the past 24 hrs:  BP Temp Temp src Pulse Resp SpO2  02/15/15 0838 - - - - - 94 %  02/15/15 0741 - - - - 15 94 %  02/15/15 0730 (!) 157/78 mmHg 99.6 F (37.6 C) Oral 79 15 95 %  02/15/15 0720 (!) 162/71 mmHg - - 81 18 93 %  02/15/15 0700 (!) 180/71 mmHg - - 82 15 95 %  02/15/15 0400 - 98.2 F (36.8 C) Oral - 19 98 %  02/15/15 0014 103/65 mmHg 98.5 F (36.9 C) Oral 72 16 95 %  02/14/15 2336 - - - - 17 97 %  02/14/15 2020 (!) 162/91 mmHg - - 74 16 97 %  02/14/15 2000 - - - - 15 95 %  02/14/15 1930 (!) 162/91 mmHg 97.9 F (36.6 C) Oral (!) 101 18 -  02/14/15 1626 - - - - - 93 %  02/14/15 1514 - - - - 13 99 %  02/14/15 1440 117/62 mmHg 98.2 F (36.8 C) Oral (!) 53 10 92 %  02/14/15 1222 - 98.4 F (36.9 C) Oral 64 16 96 %  02/14/15 1156 - - - - 17 95 %  02/14/15 1035 - - - 65 - -   Current Weight  02/13/15 134 lb 7.7 oz (61 kg)     Intake/Output from previous day: 11/03 0701 - 11/04 0700 In: 1200 [I.V.:1150; IV Piggyback:50] Out: 9702 [Urine:4025; Chest Tube:510]    PHYSICAL EXAM:  Heart: RRR Lungs: Diminished BS in L base Wound: Clean and dry Chest tube: No air leak    Lab Results: CBC: Recent Labs  02/14/15 0400 02/15/15 0431  WBC 9.6 11.8*  HGB 11.2* 12.3  HCT 34.3* 37.5  PLT 188 198   BMET:  Recent Labs  02/14/15 0400 02/15/15 0431  NA 140 141  K 3.8 4.1  CL 110 104  CO2 26 29  GLUCOSE 128* 104*  BUN 10 <5*  CREATININE 0.66 0.58  CALCIUM 8.3* 9.2    PT/INR: No results for input(s): LABPROT, INR in the last 72 hours.  CXR: FINDINGS: Normal cardiac silhouette. LEFT chest tube in  place without pneumothorax. LEFT basilar atelectasis is increased in the interval. RIGHT lung is clear.  IMPRESSION: 1. Interval increase in LEFT pleural effusion and atelectasis. 2. LEFT chest tube in place without pneumothorax   Assessment/Plan: S/P Procedure(s) (LRB): VIDEO ASSISTED THORACOSCOPY, ANTERIOR BASAL SEGMENTECTOMY (Left) CT with no air leak and CXR stable. CT output around 500 ml serosanguinous drainage over past 24 hours, so will leave CT to water seal for now.  Continue pulm toilet/IS, ambulation. HTN- BPs trending up, will resume Lisinopril and titrate dose as needed.   LOS: 2 days    Luan Urbani,Yomayra H 02/15/2015

## 2015-02-15 NOTE — Anesthesia Postprocedure Evaluation (Signed)
  Anesthesia Post-op Note  Patient: Brenda [name]  Procedure(s) Performed: Procedure(s): VIDEO ASSISTED THORACOSCOPY, ANTERIOR BASAL SEGMENTECTOMY (Left)  Patient Location: PACU  Anesthesia Type:General  Level of Consciousness: awake  Airway and Oxygen Therapy: Patient Spontanous Breathing and Patient connected to nasal cannula oxygen  Post-op Pain: moderate  Post-op Assessment: Post-op Vital signs reviewed, Patient's Cardiovascular Status Stable, Respiratory Function Stable, Patent Airway, No signs of Nausea or vomiting and Pain level controlled              Post-op Vital Signs: Reviewed and stable  Last Vitals:  Filed Vitals:   02/15/15 1452  BP: 118/65  Pulse: 70  Temp: 36.8 C  Resp: 20    Complications: No apparent anesthesia complications

## 2015-02-16 ENCOUNTER — Inpatient Hospital Stay (HOSPITAL_COMMUNITY): Payer: Self-pay

## 2015-02-16 MED ORDER — LISINOPRIL 10 MG PO TABS
10.0000 mg | ORAL_TABLET | Freq: Every day | ORAL | Status: DC
  Administered 2015-02-17: 10 mg via ORAL
  Filled 2015-02-16: qty 1

## 2015-02-16 MED ORDER — ALBUTEROL SULFATE (2.5 MG/3ML) 0.083% IN NEBU
2.5000 mg | INHALATION_SOLUTION | Freq: Three times a day (TID) | RESPIRATORY_TRACT | Status: DC
  Administered 2015-02-16 – 2015-02-18 (×7): 2.5 mg via RESPIRATORY_TRACT
  Filled 2015-02-16 (×7): qty 3

## 2015-02-16 MED ORDER — ALBUTEROL SULFATE (2.5 MG/3ML) 0.083% IN NEBU: 2.5000 mg | INHALATION_SOLUTION | RESPIRATORY_TRACT | Status: DC | PRN

## 2015-02-16 NOTE — Progress Notes (Addendum)
      [address]       [place] [address]             [phone]      3 Days Post-Op Procedure(s) (LRB): VIDEO ASSISTED THORACOSCOPY, ANTERIOR BASAL SEGMENTECTOMY (Left)   Subjective:  Ms. Hoerner complains of pain at incisional site.  She does question if she will need further treatment.  Objective: Vital signs in last 24 hours: Temp:  [97.8 F (36.6 C)-98.6 F (37 C)] 98.1 F (36.7 C) (11/05 0736) Pulse Rate:  [62-83] 80 (11/05 0918) Cardiac Rhythm:  [-] Normal sinus rhythm (11/05 0806) Resp:  [14-23] 17 (11/05 0736) BP: (113-169)/(48-86) 161/69 mmHg (11/05 0918) SpO2:  [91 %-100 %] 100 % (11/05 0924)  Intake/Output from previous day: 11/04 0701 - 11/05 0700 In: 2620 [P.O.:980; I.V.:333] Out: 1570 [Urine:1400; Chest Tube:170] Intake/Output this shift: Total I/O In: 120 [P.O.:120] Out: 30 [Chest Tube:30]  General appearance: alert, cooperative and no distress Heart: regular rate and rhythm Lungs: clear to auscultation bilaterally Wound: clean and dry  Lab Results:  Recent Labs  02/14/15 0400 02/15/15 0431  WBC 9.6 11.8*  HGB 11.2* 12.3  HCT 34.3* 37.5  PLT 188 198   BMET:  Recent Labs  02/14/15 0400 02/15/15 0431  NA 140 141  K 3.8 4.1  CL 110 104  CO2 26 29  GLUCOSE 128* 104*  BUN 10 <5*  CREATININE 0.66 0.58  CALCIUM 8.3* 9.2    PT/INR: No results for input(s): LABPROT, INR in the last 72 hours. ABG    Component Value Date/Time   PHART 7.366 02/14/2015 0340   HCO3 25.0* 02/14/2015 0340   TCO2 26.4 02/14/2015 0340   ACIDBASEDEF 1.0 02/13/2015 0752   O2SAT 93.3 02/14/2015 0340   CBG (last 3)  No results for input(s): GLUCAP in the last 72 hours.  Assessment/Plan: S/P Procedure(s) (LRB): VIDEO ASSISTED THORACOSCOPY, ANTERIOR BASAL SEGMENTECTOMY (Left)  1. Chest tube- no air leak appreciated, questionable small pneumothorax on CXR- 220 cc output- will leave chest tube to water seal today 2. Pulm- continue IS 3.  CV- remains HTN, will increase Lisinopril 4. Dispo- patient stable, adjust BP meds, leave chest tube to water seal today   LOS: 3 days    Ahmed Prima, ERIN 02/16/2015  Chest x-ray personally reviewed and is satisfactory Chest tube drainage is in excess of 200 mL's so we'll keep in place today Probably remove chest tube in a.m. and observe for 24 hours  patient examined and medical record reviewed,agree with above note. Tharon Aquas Trigt III 02/16/2015

## 2015-02-17 ENCOUNTER — Inpatient Hospital Stay (HOSPITAL_COMMUNITY): Payer: Self-pay

## 2015-02-17 MED ORDER — LISINOPRIL 20 MG PO TABS
20.0000 mg | ORAL_TABLET | Freq: Every day | ORAL | Status: DC
  Administered 2015-02-18: 20 mg via ORAL
  Filled 2015-02-17: qty 1

## 2015-02-17 NOTE — Progress Notes (Signed)
Chest tube removed per order. Patient tolerated procedure well. No adverse events noted. Will continue to monitor.

## 2015-02-17 NOTE — Progress Notes (Addendum)
      [address]       [place] [address]             [phone]      4 Days Post-Op Procedure(s) (LRB): VIDEO ASSISTED THORACOSCOPY, ANTERIOR BASAL SEGMENTECTOMY (Left)   Subjective:  Ms. Brenda [name] continues to complain of pain.  She is ambulating + BM  Objective: Vital signs in last 24 hours: Temp:  [97.7 F (36.5 C)-98.5 F (36.9 C)] 98.3 F (36.8 C) (11/06 0743) Pulse Rate:  [62-90] 77 (11/06 0911) Cardiac Rhythm:  [-] Normal sinus rhythm (11/06 0745) Resp:  [11-23] 22 (11/06 0743) BP: (133-173)/(67-79) 173/74 mmHg (11/06 0911) SpO2:  [91 %-99 %] 96 % (11/06 0813)  Intake/Output from previous day: 11/05 0701 - 11/06 0700 In: 2327.1 [P.O.:2040; I.V.:287.1] Out: 2450 [Urine:2350; Chest Tube:100] Intake/Output this shift: Total I/O In: 740 [P.O.:720; I.V.:20] Out: 190 [Chest Tube:190]  General appearance: alert, cooperative and no distress Heart: regular rate and rhythm Lungs: clear to auscultation bilaterally Abdomen: soft, non-tender; bowel sounds normal; no masses,  no organomegaly Wound: clean and dry  Lab Results:  Recent Labs  02/15/15 0431  WBC 11.8*  HGB 12.3  HCT 37.5  PLT 198   BMET:  Recent Labs  02/15/15 0431  NA 141  K 4.1  CL 104  CO2 29  GLUCOSE 104*  BUN <5*  CREATININE 0.58  CALCIUM 9.2    PT/INR: No results for input(s): LABPROT, INR in the last 72 hours. ABG    Component Value Date/Time   PHART 7.366 02/14/2015 0340   HCO3 25.0* 02/14/2015 0340   TCO2 26.4 02/14/2015 0340   ACIDBASEDEF 1.0 02/13/2015 0752   O2SAT 93.3 02/14/2015 0340   CBG (last 3)  No results for input(s): GLUCAP in the last 72 hours.  Assessment/Plan: S/P Procedure(s) (LRB): VIDEO ASSISTED THORACOSCOPY, ANTERIOR BASAL SEGMENTECTOMY (Left)  1. Chest tube- no air leak, 190 cc output yesterday according to my mark- will d/c chest tube today 2. D/C PCA 3. CV- continues to have hypertension- increase Lisinopril, continue  Lopressor 4. Dispo- patient stable, will d/c chest tube, possibly home in AM   LOS: 4 days    [name], Brenda 02/17/2015  patient examined and medical record reviewed,agree with above note. Tharon Aquas Trigt III 02/17/2015

## 2015-02-17 NOTE — Progress Notes (Signed)
PCA discontinued. 280 mcg of Fentanyl wasted in sink. Cherly Beach, RN witnessed.

## 2015-02-18 ENCOUNTER — Inpatient Hospital Stay (HOSPITAL_COMMUNITY): Payer: Self-pay

## 2015-02-18 MED ORDER — OXYCODONE HCL 5 MG PO TABS: 5.0000 mg | ORAL_TABLET | ORAL | Status: DC | PRN

## 2015-02-18 NOTE — Clinical Social Work Note (Signed)
CSW Consult Acknowledged:   CSW received a consult for Research scientist (life sciences). The financial counselor will follow up with the pt regarding insurance. CSW will sign off.   Louisa, MSW, [place]

## 2015-02-18 NOTE — Progress Notes (Signed)
Discharge instructions reviewed with patient.  Dressing to left chest changed.  Dressing placed this shift wet with serous drainage.  Prescription for oxycodone provided.

## 2015-02-18 NOTE — Progress Notes (Addendum)
       [address]       [place] [address]             [phone]          5 Days Post-Op Procedure(s) (LRB): VIDEO ASSISTED THORACOSCOPY, ANTERIOR BASAL SEGMENTECTOMY (Left)  Subjective: No new complaints.   Objective: Vital signs in last 24 hours: Patient Vitals for the past 24 hrs:  BP Temp Temp src Pulse Resp SpO2  02/18/15 0629 (!) 142/80 mmHg - - - - -  02/18/15 0627 (!) 168/76 mmHg - - 90 19 92 %  02/18/15 0334 138/76 mmHg - - 73 15 94 %  02/18/15 0332 - 98 F (36.7 C) Oral - - -  02/17/15 2315 - 98.3 F (36.8 C) Oral - - -  02/17/15 2307 (!) 103/52 mmHg - - 78 10 93 %  02/17/15 2128 - - - - - 96 %  02/17/15 1925 - 98 F (36.7 C) Oral - - -  02/17/15 1545 (!) 142/68 mmHg - - 64 10 93 %  02/17/15 1542 - 98.2 F (36.8 C) Oral - - -  02/17/15 1411 - - - - - 92 %  02/17/15 1105 (!) 163/90 mmHg 98.1 F (36.7 C) Oral 69 18 95 %  02/17/15 0911 (!) 173/74 mmHg - - 77 - -  02/17/15 0813 - - - - - 96 %  02/17/15 0743 (!) 163/79 mmHg 98.3 F (36.8 C) Oral 79 (!) 22 95 %   Current Weight  02/13/15 134 lb 7.7 oz (61 kg)     Intake/Output from previous day: 11/06 0701 - 11/07 0700 In: 1876 [P.O.:1800; I.V.:76] Out: 230 [Chest Tube:230]    PHYSICAL EXAM:  Heart: RRR Lungs: Clear Wound: Clean and dry     Lab Results: CBC:No results for input(s): WBC, HGB, HCT, PLT in the last 72 hours. BMET: No results for input(s): NA, K, CL, CO2, GLUCOSE, BUN, CREATININE, CALCIUM in the last 72 hours.  PT/INR: No results for input(s): LABPROT, INR in the last 72 hours.  CXR: FINDINGS: The patient has undergone interval removal of the left-sided chest tube. A faint pleural line in the apex is observed. This amounts to 5% or less pneumothorax. There is a small amount of pleural fluid at the left lung base. Atelectasis or infiltrate posteriorly in the left hemi thorax is present. The right lung is clear. There is no pneumomediastinum. The heart and  pulmonary vascularity are normal. The trachea is midline. The bony thorax exhibits no acute abnormality.  IMPRESSION: Interval left-sided chest tube removal. Small amount of residual left pleural fluid. A moderate amount of atelectasis or infiltrate in the left lower lobe posteriorly is present. A trace of pleural space air in the left pulmonary apex is noted but this amounts to 5% or less of the lung volume.  Assessment/Plan: S/P Procedure(s) (LRB): VIDEO ASSISTED THORACOSCOPY, ANTERIOR BASAL SEGMENTECTOMY (Left) CXR stable, sats good off O2. Plan d/c home today- instructions reviewed with patient.   LOS: 5 days    COLLINS,Jeaneane H 02/18/2015  Patient seen and examined, agree with above  Remo Lipps C. Roxan Hockey, MD [facility] [phone]

## 2015-02-18 NOTE — Evaluation (Signed)
Physical Therapy Evaluation Patient Details Name: Brenda [name] MRN: [id number] DOB: Nov 10, 1960 Today's Date: 02/18/2015   History of Present Illness  Pt is a 54 y/o F w/ dx of LLL lung nodule.  Her PMH includes current smoker (trying to quit), bipolar disorder, COPD, HTN, MI, CHF, TIA, arthritis, chronic pain syndome, cervical disc disease, anemia.  Clinical Impression  Pt admitted with above diagnosis. Pt currently with functional limitations due to the deficits listed below (see PT Problem List). Brenda [name] has h/o frequent knee buckle and fall at home and would benefit from Brenda [name], although insurance might not cover.  She will benefit from RW to dec risk of fall when Rt knee buckle does occur.  Provided pt w/ min guard assist during ambulation today for pt's safety. Pt will benefit from skilled PT to increase their independence and safety with mobility to allow discharge to the venue listed below.      Follow Up Recommendations Home health PT (per CM, insurance will likely not cover)    Equipment Recommendations  Rolling walker with 5" wheels    Recommendations for Other Services       Precautions / Restrictions Precautions Precautions: Fall Precaution Comments: Pt reports 2-3 falls/wk 2/2 Rt knee buckle 2/2 cyst in lumbar spine Restrictions Weight Bearing Restrictions: No      Mobility  Bed Mobility               General bed mobility comments: Pt sitting in recliner chair at PT arrival  Transfers Overall transfer level: Modified independent Equipment used: None             General transfer comment: Min instability w/ standing, pt provided RW  Ambulation/Gait Ambulation/Gait assistance: Min guard Ambulation Distance (Feet): 300 Feet Assistive device: Rolling walker (2 wheeled) Gait Pattern/deviations: Step-through pattern;Decreased stride length   Gait velocity interpretation: Below normal speed for age/gender General Gait Details: Slightly dec gait speed  and min guard assist provided given pt's h/o frequent Rt knee buckle.  No knee buckle noted this session.    Stairs Stairs: Yes Stairs assistance: Supervision Stair Management: One rail Left;Step to pattern;Forwards Number of Stairs: 4 General stair comments: Cues for technique sequencing and managing RW up/down steps.  Superivison for pt's safety  Wheelchair Mobility    Modified Rankin (Stroke Patients Only)       Balance Overall balance assessment: Needs assistance;History of Falls Sitting-balance support: No upper extremity supported;Feet supported Sitting balance-Leahy Scale: Good     Standing balance support: During functional activity Standing balance-Leahy Scale: Fair                   Standardized Balance Assessment Standardized Balance Assessment : Dynamic Gait Index   Dynamic Gait Index Level Surface: Mild Impairment Gait with Horizontal Head Turns: Mild Impairment Gait with Vertical Head Turns: Mild Impairment Gait and Pivot Turn: Mild Impairment Steps: Moderate Impairment (step to pattern and use of rail)       Pertinent Vitals/Pain Pain Assessment: No/denies pain    Home Living Family/patient expects to be discharged to:: Private residence Living Arrangements: Alone Available Help at Discharge: Friend(s);Available PRN/intermittently Type of Home: Apartment Home Access: Stairs to enter Entrance Stairs-Rails: Chemical engineer of Steps: 4 Home Layout: One level Home Equipment: Cane - single point Additional Comments: Pt says she would like to have a RW to allow her to catch herself when her Rt knee buckles    Prior Function Level of Independence: Independent with assistive device(s)  Comments: PTA pt uses cane at all times     Hand Dominance        Extremity/Trunk Assessment   Upper Extremity Assessment: Overall WFL for tasks assessed           Lower Extremity Assessment: RLE deficits/detail;LLE  deficits/detail RLE Deficits / Details: Rt knee flexion/extension 3+/5, otherwise grossly 4/5 strength w/ MMT LLE Deficits / Details: grossly 4/5 strength w/ MMT     Communication   Communication: No difficulties  Cognition Arousal/Alertness: Awake/alert Behavior During Therapy: WFL for tasks assessed/performed Overall Cognitive Status: Within Functional Limits for tasks assessed                      General Comments      Exercises General Exercises - Lower Extremity Long Arc Quad: AROM;Both;10 reps;Seated Hip Flexion/Marching: AROM;Both;20 reps;Seated;Standing Other Exercises Other Exercises: Pt encouraged to ambulate in hospital w/ nursing staff and at home w/ visitors/friends      Assessment/Plan    PT Assessment Patient needs continued PT services  PT Diagnosis Difficulty walking;Abnormality of gait;Generalized weakness   PT Problem List Decreased strength;Decreased range of motion;Decreased activity tolerance;Decreased balance;Decreased mobility;Decreased knowledge of use of DME;Decreased knowledge of precautions;Decreased safety awareness  PT Treatment Interventions DME instruction;Gait training;Stair training;Functional mobility training;Therapeutic activities;Therapeutic exercise;Balance training;Neuromuscular re-education;Patient/family education   PT Goals (Current goals can be found in the Care Plan section) Acute Rehab PT Goals Patient Stated Goal: to go home w/ RW PT Goal Formulation: With patient Time For Goal Achievement: 03/04/15 Potential to Achieve Goals: Good    Frequency Min 2X/week   Barriers to discharge Inaccessible home environment;Decreased caregiver support Intermittent assist at home and 4 steps to enter home    Co-evaluation               End of Session   Activity Tolerance: Patient tolerated treatment well Patient left: in chair;with call bell/phone within reach Nurse Communication: Mobility status;Precautions          Time: 7062-3762 PT Time Calculation (min) (ACUTE ONLY): 26 min   Charges:   PT Evaluation $Initial PT Evaluation Tier I: 1 Procedure PT Treatments $Gait Training: 8-22 mins   PT G CodesJoslyn [name] PT, DPT [phone] Pager: [phone] 02/18/2015, 10:48 AM

## 2015-02-18 NOTE — Progress Notes (Signed)
CM spoke with pt regarding medication assistance needs. Pt states she can't afford the copays on medication. Pt '[facility]  [facility] @ [phone]. Pt has orange card with limited copays. CM called [facility] @ [phone]) for ? help with medication needs. Crystal stated pharmacy will help pt this one time with medications,`except  depakote and remeron are unavailable. CM shared this information with pt and pt stated she has depakote @ home and doesn't take remeron.  Pt stated will get in touch with PCP to fax prescriptions to [facility]. Prescription for oxycodone pt stated will have friends to help get filled. Whitman Hero RN,BSN,CM [phone]

## 2015-02-19 ENCOUNTER — Other Ambulatory Visit: Payer: Self-pay | Admitting: Internal Medicine

## 2015-02-19 ENCOUNTER — Other Ambulatory Visit: Payer: Self-pay | Admitting: *Deleted

## 2015-02-19 DIAGNOSIS — R5082 Postprocedural fever: Secondary | ICD-10-CM

## 2015-02-19 MED ORDER — AMOXICILLIN-POT CLAVULANATE 500-125 MG PO TABS: 1.0000 | ORAL_TABLET | Freq: Three times a day (TID) | ORAL | Status: DC

## 2015-02-19 NOTE — Telephone Encounter (Signed)
Called the [facility] and spoke to crystal and cathy, they stated that they would be willing to fill pt's meds for 1 month only at no cost after speaking to case manager at [place], will not be able to fill all the meds, they wanted new scripts for 1 month but had scripts that had not been picked up, i agreed to fax the snapshot med list and disch med list

## 2015-02-19 NOTE — Telephone Encounter (Signed)
Pt requesting all meds to be filled @ [facility].

## 2015-02-21 ENCOUNTER — Other Ambulatory Visit: Payer: Self-pay | Admitting: *Deleted

## 2015-02-21 ENCOUNTER — Encounter: Payer: Self-pay | Admitting: Physician Assistant

## 2015-02-21 ENCOUNTER — Ambulatory Visit
Admission: RE | Admit: 2015-02-21 | Discharge: 2015-02-21 | Disposition: A | Discharge status: Home / Self Care | Payer: No Typology Code available for payment source | Source: Ambulatory Visit | Attending: Thoracic Surgery (Cardiothoracic Vascular Surgery) | Admitting: Thoracic Surgery (Cardiothoracic Vascular Surgery)

## 2015-02-21 ENCOUNTER — Ambulatory Visit (INDEPENDENT_AMBULATORY_CARE_PROVIDER_SITE_OTHER): Payer: Self-pay | Admitting: Physician Assistant

## 2015-02-21 ENCOUNTER — Ambulatory Visit: Payer: Self-pay

## 2015-02-21 VITALS — BP 140/88 | HR 116 | Temp 97.6°F | Resp 20 | Ht 63.0 in | Wt 134.0 lb

## 2015-02-21 DIAGNOSIS — R0602 Shortness of breath: Secondary | ICD-10-CM

## 2015-02-21 DIAGNOSIS — G8918 Other acute postprocedural pain: Secondary | ICD-10-CM

## 2015-02-21 NOTE — Progress Notes (Addendum)
HPI: Patient presents today with complaints of fever (up to 101) since being discharged from the hospital on 02/18/2015. She is s/p left VATS, LLL anterior basilar segmentectomy, and lymph node dissection by Dr. Roxan Hockey on 02/13/2015. She was started on Augmentin on Tuesday. She also has complaints of incisional pain and occasional shortness of breath. She denies sputum production.  Current Outpatient Prescriptions  Medication Sig Dispense Refill  . amLODipine (NORVASC) 10 MG tablet Take 1 tablet (10 mg total) by mouth daily. 90 tablet 3  . amoxicillin-clavulanate (AUGMENTIN) 500-125 MG tablet Take 1 tablet (500 mg total) by mouth 3 (three) times daily. 21 tablet 0  . aspirin EC 81 MG tablet Take 1 tablet (81 mg total) by mouth daily. 180 tablet 0  . cyclobenzaprine (FLEXERIL) 5 MG tablet Take 1 tablet (5 mg total) by mouth 3 (three) times daily as needed for muscle spasms. 90 tablet 0  . diclofenac sodium (VOLTAREN) 1 % GEL Apply 1 application topically 4 (four) times daily as needed. For pain 3 Tube 3  . dicyclomine (BENTYL) 20 MG tablet Take 1 tablet (20 mg total) by mouth 4 (four) times daily -  before meals and at bedtime. 120 tablet 3  . divalproex (DEPAKOTE) 250 MG DR tablet Take 2 tablets (500 mg total) by mouth 2 (two) times daily. 120 tablet 3  . famotidine (PEPCID) 20 MG tablet Take 1 tablet (20 mg total) by mouth 2 (two) times daily. 180 tablet 3  . gabapentin (NEURONTIN) 300 MG capsule Take 2 capsules (600 mg total) by mouth 3 (three) times daily. 180 capsule 3  . lisinopril-hydrochlorothiazide (PRINZIDE,ZESTORETIC) 20-12.5 MG tablet Take 2 tablets by mouth daily. 60 tablet 2  . metoprolol tartrate (LOPRESSOR) 25 MG tablet Take 1 tablet (25 mg total) by mouth 2 (two) times daily. 180 tablet 3  . mirtazapine (REMERON) 15 MG tablet Take 15 mg by mouth at bedtime.    . Multiple Vitamin (DAILY VALUE MULTIVITAMIN PO) Take 1 tablet by mouth daily.     . nitroGLYCERIN (NITROSTAT) 0.4 MG  SL tablet Place 1 tablet (0.4 mg total) under the tongue every 5 (five) minutes as needed for chest pain. 30 tablet 0  . oxyCODONE (OXY IR/ROXICODONE) 5 MG immediate release tablet Take 1-2 tablets (5-10 mg total) by mouth every 4 (four) hours as needed for severe pain. 40 tablet 0  . sertraline (ZOLOFT) 100 MG tablet Take 1 tablet (100 mg total) by mouth daily. 90 tablet 3  . simvastatin (ZOCOR) 20 MG tablet Take 1 tablet (20 mg total) by mouth every evening. 90 tablet 3  Vital Signs: Afebrile, HR 116, RR 20, oxygen saturation 94% on room air  Physical Exam: CV-Tachycardic Pulmonary-Diminished at left base;right lung clear Wounds-Clean and dry. One nylon suture removed. No signs of wound infection  Diagnostic Tests: PA/LAT CXR: No pneumothorax, small left pleural effusion, and developing LLL infiltrate (possibly PNA)  Impression and Plan: She has no signs of wound infection or GU complaints. She could be developing pneumonia. . She has only been on Augmentin since Tuesday. She was told to take antibiotic as instructed and to finish it entirely. I instructed her if she develops more fever, any chills, sputum production, increasing shortness of breath she needs to go to ER.  She stated if she feels worse, she will go to ER. Regarding pain control, she told me she has been taking Oxy IR 5 mg (two) almost every 4 hours for her pain and only has "2  pain pills left". I instructed her that she is to only take Oxy IR 5 mg every 6 hours PRN severe pain. She may use Ibuprofen or Tylenol (as directed) in the interim. I gave her a prescription for Oxy IR 5 mg one every 6 hours PRN pain (#20, NO refills). She has a history of chronic back pain, but does not see a chronic pain specialist. I told her she will not be given any more narcotics. She was instructed to continue using the incentive spirometerShe has a follow up appointment to see Dr. Roxan Hockey in about 2 weeks.   Nani Skillern, PA-C [facility] [phone]

## 2015-02-26 ENCOUNTER — Encounter: Payer: Self-pay | Admitting: Student

## 2015-03-04 ENCOUNTER — Other Ambulatory Visit: Payer: Self-pay | Admitting: Thoracic Surgery (Cardiothoracic Vascular Surgery)

## 2015-03-04 DIAGNOSIS — C3490 Malignant neoplasm of unspecified part of unspecified bronchus or lung: Secondary | ICD-10-CM

## 2015-03-05 ENCOUNTER — Ambulatory Visit (INDEPENDENT_AMBULATORY_CARE_PROVIDER_SITE_OTHER): Payer: Self-pay | Admitting: Thoracic Surgery (Cardiothoracic Vascular Surgery)

## 2015-03-05 ENCOUNTER — Ambulatory Visit: Payer: Self-pay | Admitting: Thoracic Surgery (Cardiothoracic Vascular Surgery)

## 2015-03-05 ENCOUNTER — Ambulatory Visit
Admission: RE | Admit: 2015-03-05 | Discharge: 2015-03-05 | Disposition: A | Discharge status: Home / Self Care | Payer: No Typology Code available for payment source | Source: Ambulatory Visit | Attending: Thoracic Surgery (Cardiothoracic Vascular Surgery) | Admitting: Thoracic Surgery (Cardiothoracic Vascular Surgery)

## 2015-03-05 ENCOUNTER — Encounter: Payer: Self-pay | Admitting: Thoracic Surgery (Cardiothoracic Vascular Surgery)

## 2015-03-05 VITALS — BP 170/95 | HR 100 | Resp 20 | Ht 63.0 in | Wt 132.0 lb

## 2015-03-05 DIAGNOSIS — C3490 Malignant neoplasm of unspecified part of unspecified bronchus or lung: Secondary | ICD-10-CM

## 2015-03-05 DIAGNOSIS — C3492 Malignant neoplasm of unspecified part of left bronchus or lung: Secondary | ICD-10-CM

## 2015-03-05 DIAGNOSIS — G8918 Other acute postprocedural pain: Secondary | ICD-10-CM

## 2015-03-05 MED ORDER — OXYCODONE HCL 5 MG PO TABS: 5.0000 mg | ORAL_TABLET | ORAL | Status: DC | PRN
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
## 2015-03-11 ENCOUNTER — Telehealth: Payer: Self-pay | Admitting: *Deleted

## 2015-03-11 NOTE — Telephone Encounter (Signed)
Oncology Nurse Navigator Documentation  Oncology Nurse Navigator Flowsheets 03/11/2015  Navigator Encounter Type Introductory phone call/I received a referral from Dr. Leonarda Salon office.  I called patient to schedule for [facility] 03/14/15.  I left a vm message to call with my name and phone number  Patient Visit Type Initial  Treatment Phase Treatment  Interventions Coordination of Care  Coordination of Care MD Appointments  Time Spent with Patient 15

## 2015-03-12 ENCOUNTER — Telehealth: Payer: Self-pay | Admitting: *Deleted

## 2015-03-12 ENCOUNTER — Emergency Department (HOSPITAL_COMMUNITY): Payer: No Typology Code available for payment source

## 2015-03-12 ENCOUNTER — Encounter (HOSPITAL_COMMUNITY): Payer: Self-pay | Admitting: Emergency Medicine

## 2015-03-12 ENCOUNTER — Ambulatory Visit: Payer: Self-pay | Admitting: Thoracic Surgery (Cardiothoracic Vascular Surgery)

## 2015-03-12 ENCOUNTER — Emergency Department (HOSPITAL_COMMUNITY)
Admission: EM | Admit: 2015-03-12 | Discharge: 2015-03-12 | Discharge status: Against Medical Advice | Payer: No Typology Code available for payment source | Attending: Emergency Medicine | Admitting: Emergency Medicine

## 2015-03-12 ENCOUNTER — Ambulatory Visit (INDEPENDENT_AMBULATORY_CARE_PROVIDER_SITE_OTHER): Payer: No Typology Code available for payment source | Admitting: Pulmonary Disease

## 2015-03-12 ENCOUNTER — Encounter: Payer: Self-pay | Admitting: Pulmonary Disease

## 2015-03-12 ENCOUNTER — Other Ambulatory Visit: Payer: Self-pay

## 2015-03-12 VITALS — BP 146/78 | HR 90 | Ht 63.5 in | Wt 119.0 lb

## 2015-03-12 DIAGNOSIS — J432 Centrilobular emphysema: Secondary | ICD-10-CM

## 2015-03-12 DIAGNOSIS — Z23 Encounter for immunization: Secondary | ICD-10-CM

## 2015-03-12 DIAGNOSIS — R0781 Pleurodynia: Secondary | ICD-10-CM | POA: Insufficient documentation

## 2015-03-12 DIAGNOSIS — I2510 Atherosclerotic heart disease of native coronary artery without angina pectoris: Secondary | ICD-10-CM | POA: Insufficient documentation

## 2015-03-12 DIAGNOSIS — C3432 Malignant neoplasm of lower lobe, left bronchus or lung: Secondary | ICD-10-CM

## 2015-03-12 DIAGNOSIS — Z736 Limitation of activities due to disability: Secondary | ICD-10-CM

## 2015-03-12 DIAGNOSIS — I252 Old myocardial infarction: Secondary | ICD-10-CM | POA: Insufficient documentation

## 2015-03-12 DIAGNOSIS — I509 Heart failure, unspecified: Secondary | ICD-10-CM | POA: Insufficient documentation

## 2015-03-12 DIAGNOSIS — I10 Essential (primary) hypertension: Secondary | ICD-10-CM | POA: Insufficient documentation

## 2015-03-12 MED ORDER — OXYCODONE-ACETAMINOPHEN 5-325 MG PO TABS
1.0000 | ORAL_TABLET | Freq: Once | ORAL | Status: AC
  Administered 2015-03-12: 1 via ORAL

## 2015-03-12 MED ORDER — OXYCODONE-ACETAMINOPHEN 5-325 MG PO TABS
ORAL_TABLET | ORAL | Status: AC
  Filled 2015-03-12: qty 1

## 2015-03-12 NOTE — Addendum Note (Signed)
Addended by: Len Blalock on: 03/12/2015 02:35 PM   Modules accepted: Orders

## 2015-03-12 NOTE — Assessment & Plan Note (Signed)
Her pulmonary function testing preoperatively did not show airflow obstruction but there was a decreased DLCO which is consistent with mild centrilobular emphysema.  I explained to her at length today that she needs to make efforts to quit smoking. This pertains to both her emphysema as well as her lung cancer.  Plan: She was provided information for free nicotine replacement as well as local smoking cessation classes Albuterol as needed for shortness of breath Prevnar vaccine today Follow-up one year or sooner if needed

## 2015-03-12 NOTE — Telephone Encounter (Signed)
Pt had cardiothoracic surgery, she is discharged from Dr Hendrickson's service. Dr Roxan Hockey stated in his note he was going to send pt to [facility]. She saw Dr Lake Bells today and she said he does not prescribe pain meds. She has not been seen at [facility] yet. She is out of pain medication and having "brutal" pain. Having diarrhea. Sent pt to ER. Sent message to HIM, forwarded to Dr Julien Nordmann and RN

## 2015-03-12 NOTE — Assessment & Plan Note (Signed)
This was treated with surgery this year. She had adenocarcinoma. She will follow-up with oncology this month.
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
## 2015-03-12 NOTE — ED Notes (Signed)
Pt seen walking out of room fully dressed with walker yelling to herself. Asked pt where she is going, replied,  "I am leaving, Im calling [employer], nobody should wait that long." Asked pt if she was sure she wants to leave she just got to a room. Pt continues to leave, ambulating independently with walker. No distress noted.

## 2015-03-12 NOTE — Telephone Encounter (Signed)
Oncology Nurse Navigator Documentation  Oncology Nurse Navigator Flowsheets 03/12/2015  Navigator Encounter Type Introductory phone call/I called again to schedule appt for [place].  I spoke with family.  She will be seen on 03/28/15 with Dr. Julien Nordmann  Patient Visit Type Initial  Treatment Phase -  Interventions Coordination of Care  Coordination of Care MD Appointments  Time Spent with Patient 15

## 2015-03-12 NOTE — Patient Instructions (Signed)
Stop smoking Call [phone] quit now to get free nicotine replacement from the state of [place] Use albuterol 2 puffs every 4-6 hours as needed for shortness of breath We will see you back in one year or sooner if needed

## 2015-03-12 NOTE — ED Notes (Signed)
Pt sts left rib pain at surgical area where had procedure the beginning of November; pt sts increased pain after cooking on Thanksgiving; pt sts ran out of pain meds 2 days ago

## 2015-03-13 NOTE — Telephone Encounter (Signed)
Nothing I can do. She needs to reach out to her PCP or Dr. Lake Bells. She is not established with me and I may not Rx her pain medications if she does not have active disease causing it.

## 2015-03-13 NOTE — Telephone Encounter (Signed)
lmovm home # for pt to reach out to PCP or Dr. Pennie Banter as she is not an established pt with MD at this time.

## 2015-03-15 ENCOUNTER — Encounter: Payer: Self-pay | Admitting: Internal Medicine

## 2015-03-15 ENCOUNTER — Ambulatory Visit: Payer: No Typology Code available for payment source | Admitting: Internal Medicine

## 2015-03-15 ENCOUNTER — Ambulatory Visit (INDEPENDENT_AMBULATORY_CARE_PROVIDER_SITE_OTHER): Payer: No Typology Code available for payment source | Admitting: Internal Medicine

## 2015-03-15 VITALS — BP 182/93 | HR 98 | Temp 99.0°F | Ht 63.0 in | Wt 118.9 lb

## 2015-03-15 DIAGNOSIS — G894 Chronic pain syndrome: Secondary | ICD-10-CM

## 2015-03-15 DIAGNOSIS — M47819 Spondylosis without myelopathy or radiculopathy, site unspecified: Secondary | ICD-10-CM

## 2015-03-15 DIAGNOSIS — F32A Depression, unspecified: Secondary | ICD-10-CM

## 2015-03-15 DIAGNOSIS — M479 Spondylosis, unspecified: Secondary | ICD-10-CM

## 2015-03-15 DIAGNOSIS — K588 Other irritable bowel syndrome: Secondary | ICD-10-CM

## 2015-03-15 DIAGNOSIS — F418 Other specified anxiety disorders: Secondary | ICD-10-CM

## 2015-03-15 DIAGNOSIS — F419 Anxiety disorder, unspecified: Secondary | ICD-10-CM

## 2015-03-15 DIAGNOSIS — F319 Bipolar disorder, unspecified: Secondary | ICD-10-CM

## 2015-03-15 DIAGNOSIS — I10 Essential (primary) hypertension: Secondary | ICD-10-CM

## 2015-03-15 DIAGNOSIS — F329 Major depressive disorder, single episode, unspecified: Secondary | ICD-10-CM

## 2015-03-15 DIAGNOSIS — I2510 Atherosclerotic heart disease of native coronary artery without angina pectoris: Secondary | ICD-10-CM

## 2015-03-15 DIAGNOSIS — E785 Hyperlipidemia, unspecified: Secondary | ICD-10-CM

## 2015-03-15 DIAGNOSIS — C3432 Malignant neoplasm of lower lobe, left bronchus or lung: Secondary | ICD-10-CM

## 2015-03-15 MED ORDER — ASPIRIN EC 81 MG PO TBEC: 81.0000 mg | DELAYED_RELEASE_TABLET | Freq: Every day | ORAL | Status: DC

## 2015-03-15 MED ORDER — MIRTAZAPINE 15 MG PO TABS: 15.0000 mg | ORAL_TABLET | Freq: Every day | ORAL | Status: DC

## 2015-03-15 MED ORDER — DIVALPROEX SODIUM 250 MG PO DR TAB: 500.0000 mg | DELAYED_RELEASE_TABLET | Freq: Two times a day (BID) | ORAL | Status: DC

## 2015-03-15 MED ORDER — AMLODIPINE BESYLATE 10 MG PO TABS: 10.0000 mg | ORAL_TABLET | Freq: Every day | ORAL | Status: DC

## 2015-03-15 MED ORDER — CYCLOBENZAPRINE HCL 5 MG PO TABS: 5.0000 mg | ORAL_TABLET | Freq: Three times a day (TID) | ORAL | Status: DC | PRN

## 2015-03-15 MED ORDER — DICLOFENAC SODIUM 1 % TD GEL: 1.0000 "application " | Freq: Four times a day (QID) | TRANSDERMAL | Status: DC | PRN

## 2015-03-15 MED ORDER — LISINOPRIL-HYDROCHLOROTHIAZIDE 20-12.5 MG PO TABS: 2.0000 | ORAL_TABLET | Freq: Every day | ORAL | Status: DC

## 2015-03-15 MED ORDER — OXYCODONE-ACETAMINOPHEN 10-325 MG PO TABS: 1.0000 | ORAL_TABLET | Freq: Four times a day (QID) | ORAL | Status: DC | PRN

## 2015-03-15 MED ORDER — SIMVASTATIN 20 MG PO TABS: 20.0000 mg | ORAL_TABLET | Freq: Every evening | ORAL | Status: DC

## 2015-03-15 MED ORDER — METOPROLOL TARTRATE 25 MG PO TABS: 25.0000 mg | ORAL_TABLET | Freq: Two times a day (BID) | ORAL | Status: DC

## 2015-03-15 MED ORDER — DAILY VALUE MULTIVITAMIN PO TABS: 1.0000 | ORAL_TABLET | Freq: Every day | ORAL | Status: DC

## 2015-03-15 MED ORDER — NITROGLYCERIN 0.4 MG SL SUBL: 0.4000 mg | SUBLINGUAL_TABLET | SUBLINGUAL | Status: DC | PRN

## 2015-03-15 MED ORDER — GABAPENTIN 300 MG PO CAPS: 600.0000 mg | ORAL_CAPSULE | Freq: Three times a day (TID) | ORAL | Status: DC

## 2015-03-15 MED ORDER — FAMOTIDINE 20 MG PO TABS: 20.0000 mg | ORAL_TABLET | Freq: Two times a day (BID) | ORAL | Status: DC

## 2015-03-15 MED ORDER — SERTRALINE HCL 100 MG PO TABS: 100.0000 mg | ORAL_TABLET | Freq: Every day | ORAL | Status: DC

## 2015-03-15 MED ORDER — DICYCLOMINE HCL 20 MG PO TABS: 20.0000 mg | ORAL_TABLET | Freq: Three times a day (TID) | ORAL | Status: DC

## 2015-03-15 NOTE — Assessment & Plan Note (Signed)
Assessent: Patient treated with surgery last month by Dr. Roxan Hockey and has follow up with oncology later this month.  Plan: - Follow up with oncology as planned

## 2015-03-15 NOTE — Patient Instructions (Signed)
Please schedule an appointment to follow up with [place] in 2-3 weeks.  It was nice to meet you today!  I will send prescriptions to the [facility] for you today but it may take a couple weeks for them to process everything for you.

## 2015-03-15 NOTE — Assessment & Plan Note (Signed)
Assessment: Patients reason for this visit is for her pain. She is s/p left VATS, LLL basilar segmentectomy, and lymph node dissection by Dr. Roxan Hockey on 02/18/2015. She subsequently saw him on 11/22 for complaint of pain secondary to the surgery and was refilled Oxycodone '5mg'$  q4h prn and given 40 tablets. She consumed those and presented to the ED on 11/29 but left after 6 hours without receiving treatment. Today, she states her pain has increased significantly since Thanksgiving after cooking dinner and lifting some heavy objects. States pain is constant but aggravated by movement, turning, and feels like a tearing sensation at her surgery site. She reports she is having difficulty sleeping due to pain.  Patient is tearful and anxious during the encounter and frequently goes back and forth between her past medical conditions.  She states that Percocet has been the best for her pain control since the surgery.    Plan: - Given prescription today for Percocet 10-325 q6h for pain #60 tablets.  I explained that this was to get her through current pain from her surgery that was approximately 3 weeks ago and asked her to follow up in 3 weeks to assess her pain control.  If she is still having significant pain at this time, would probably focus on other co-morbidities contributing to her pain such as psychiatric illness and consider addressing those versus starting on chronic opiates.  She does seem high risk to have on chronic opiate therapy with history of substance abuse and previous inappropriate UDS's.  I would not recommend continuing at this dose if on chronic narcotics   - If further narcotic prescriptions given, will first ask patient to fill out Pain Contract with PCP to establish her goals of pain control and function - RTC 3 weeks
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
## 2015-03-19 NOTE — Progress Notes (Signed)
Internal Medicine Clinic Attending  I saw and evaluated the patient.  I personally confirmed the key portions of the history and exam documented by Dr. Wallace and I reviewed pertinent patient test results.  The assessment, diagnosis, and plan were formulated together and I agree with the documentation in the resident's note. 

## 2015-03-23 IMAGING — CT CT ABD-PELV W/O CM
2 of 4 series · 16 of 46 positions shown, 18 images · non-contrast
Comparison: 05/11/2011.

CLINICAL DATA: Right flank pain with nausea and hematuria.

CT ABDOMEN AND PELVIS WITHOUT CONTRAST
TECHNIQUE: Multidetector CT imaging of the abdomen and pelvis was
performed following the standard protocol without intravenous
contrast.

[Series 2: stone study 5.0 i30f 1 · axial · 0.69mm/px · z∈[-480,-70]mm · 13 of 90 slices shown, 15 images]
[im 4/90  soft-tissue]
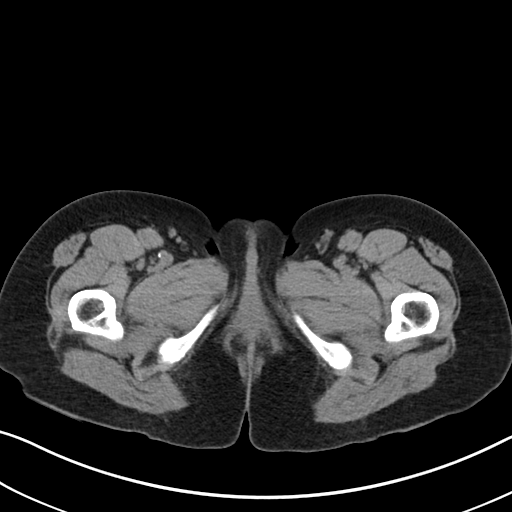
[im 4/90  bone]
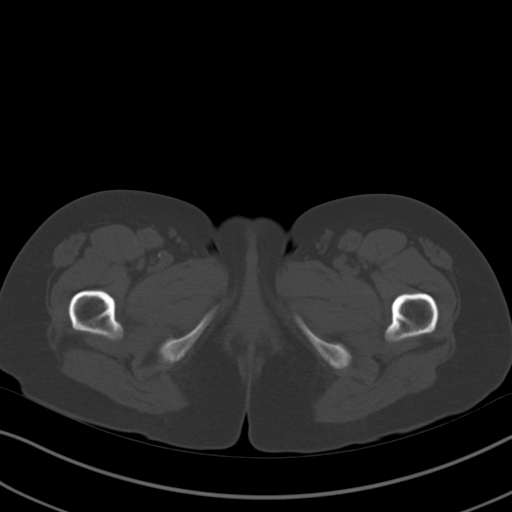
[im 12/90  soft-tissue]
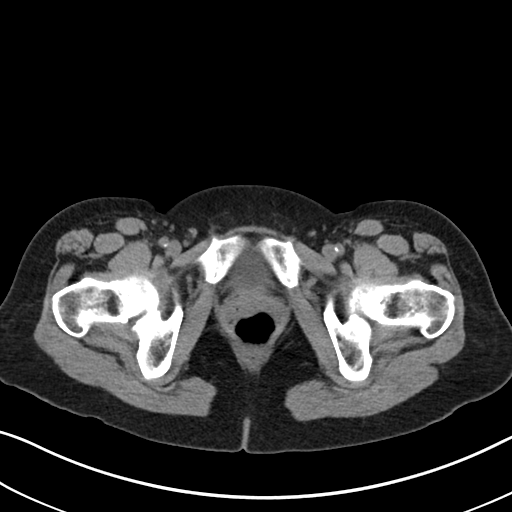
[im 19/90  soft-tissue]
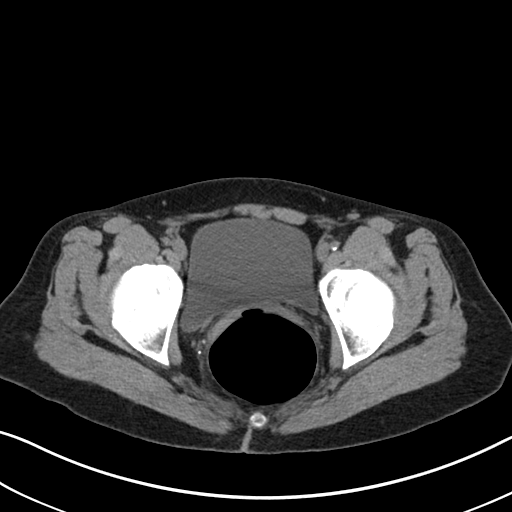
[im 26/90  soft-tissue]
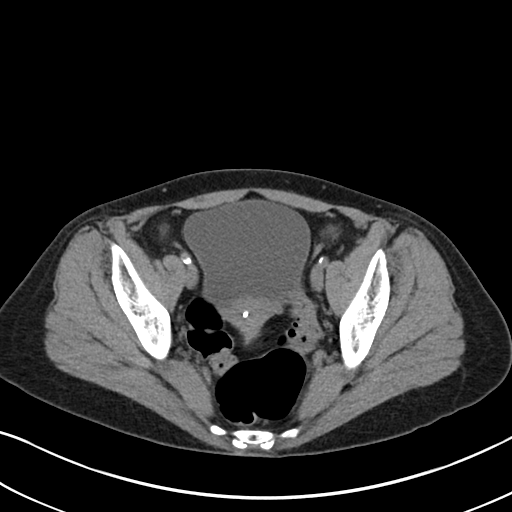
[im 30/90  soft-tissue]
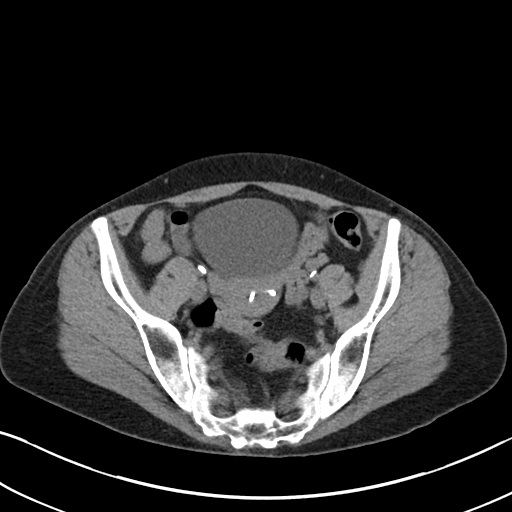
[im 38/90  soft-tissue]
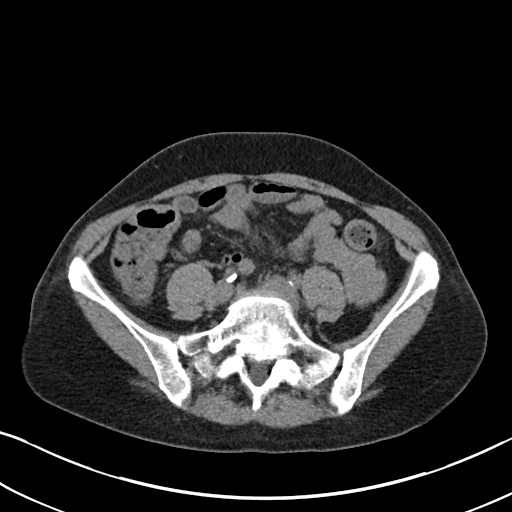
[im 45/90  soft-tissue]
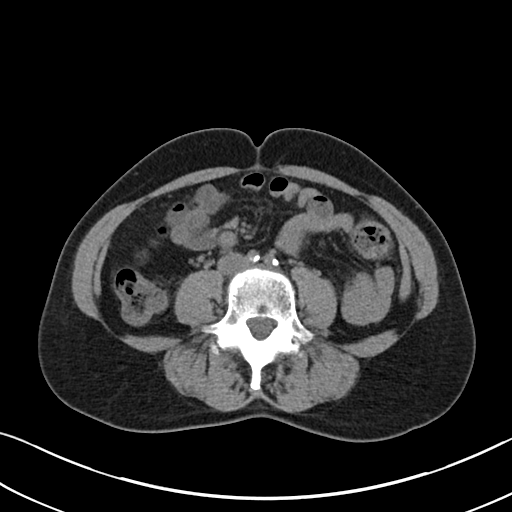
[im 52/90  soft-tissue]
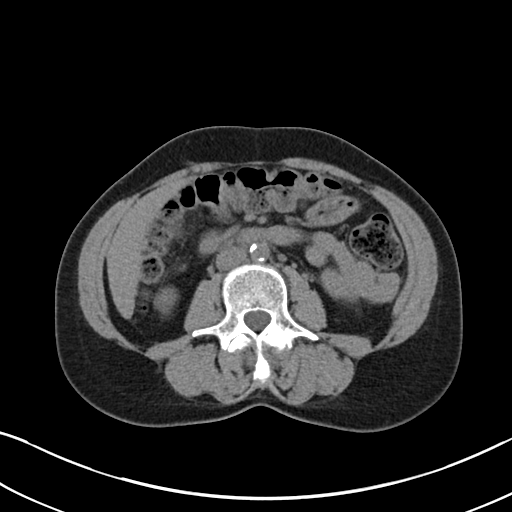
[im 60/90  soft-tissue]
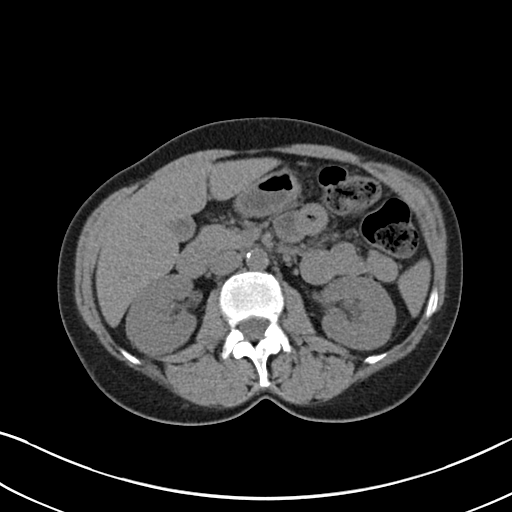
[im 60/90  bone]
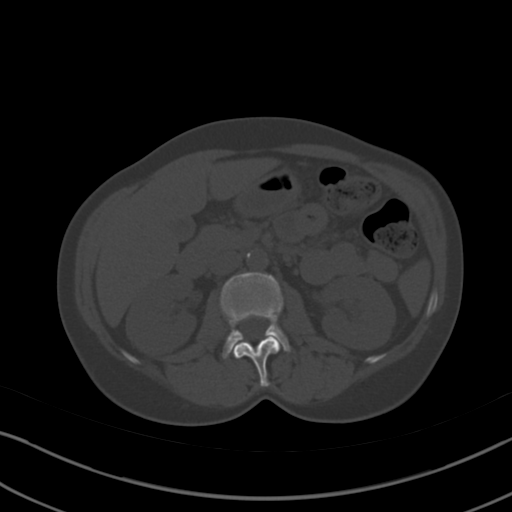
[im 64/90  soft-tissue]
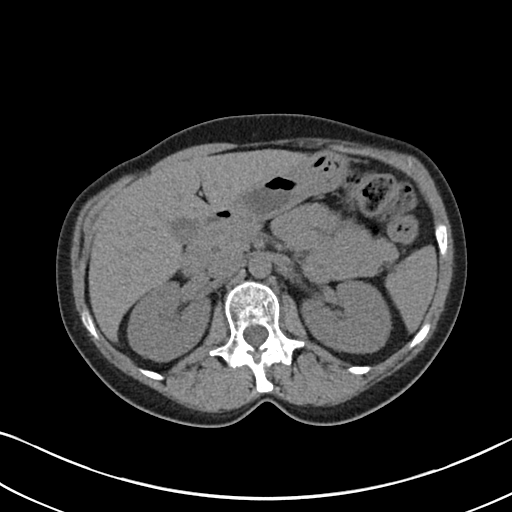
[im 71/90  soft-tissue]
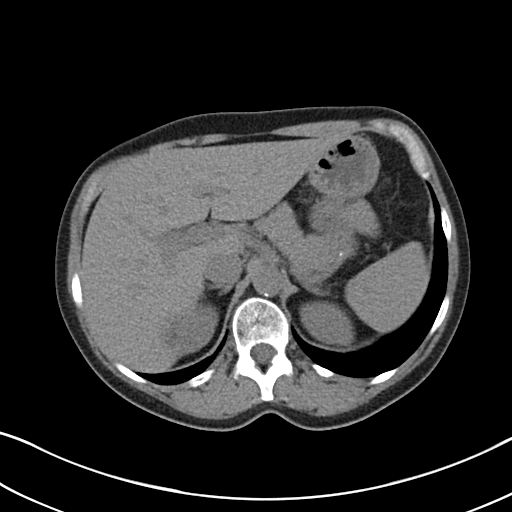
[im 78/90  soft-tissue]
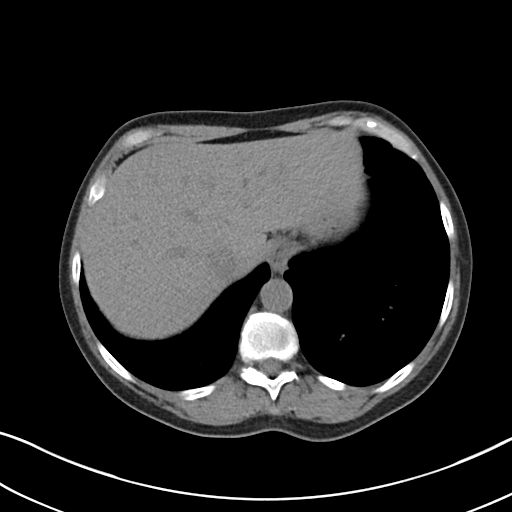
[im 86/90  soft-tissue]
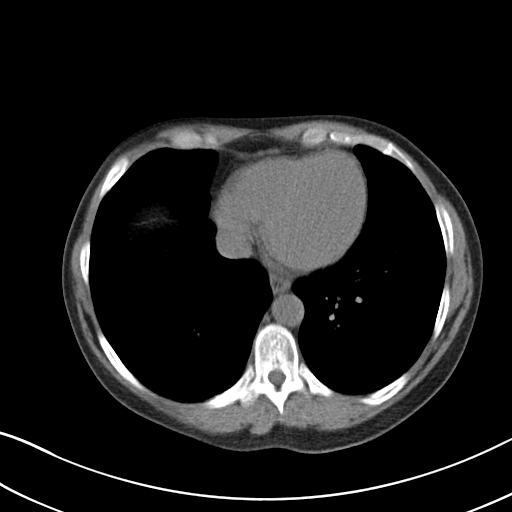

[Series 5: coronal soft tissue · coronal · 0.87mm/px · 3 of 67 slices shown]
[im 23/67  soft-tissue]
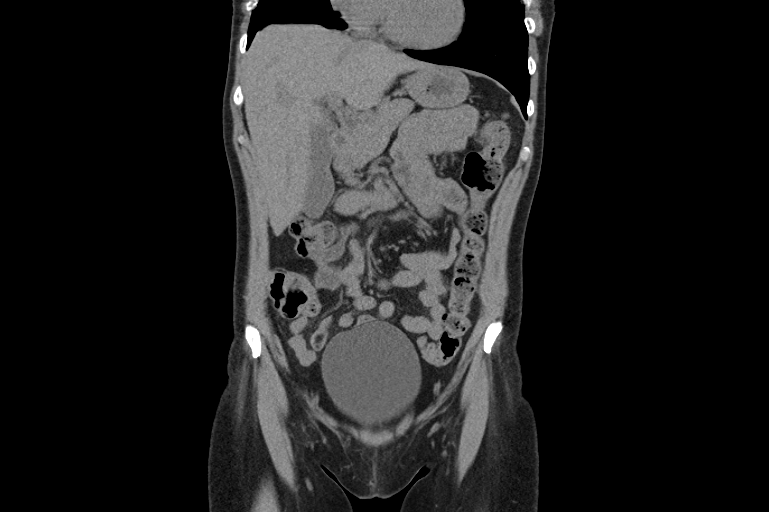
[im 30/67  soft-tissue]
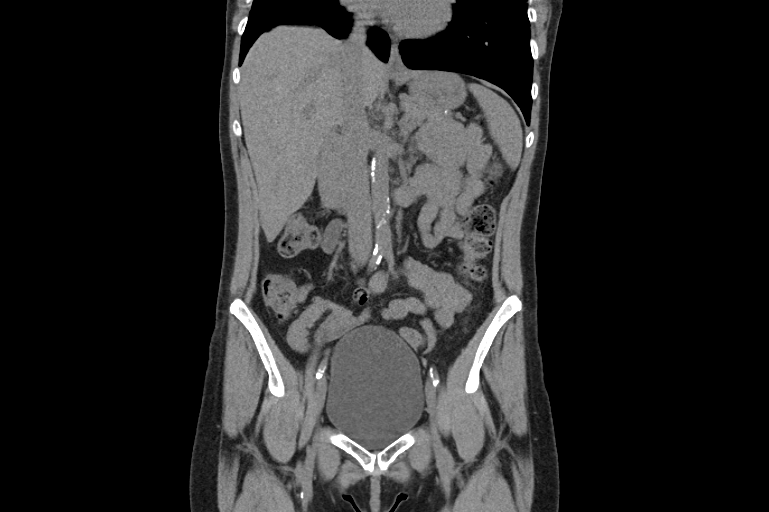
[im 37/67  soft-tissue]
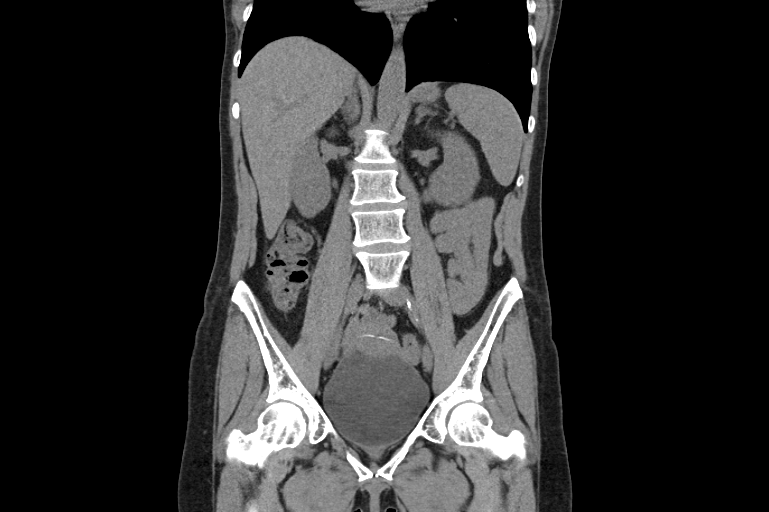

[16 of 46 positions shown; findings below may reference images not displayed]

FINDINGS: LOWER CHEST:

Mediastinum: Unremarkable.

Lungs/pleura: No consolidation.11 mm ground-glass opacity in the
anterior basal segment left lower lobe, stable since at least
02/16/2011 CT imaging.

ABDOMEN/PELVIS:

Liver: No focal abnormality.

Biliary: No evidence of biliary obstruction or stone.

Pancreas: Unremarkable.

Spleen: Unremarkable.

Adrenals: Unremarkable.

Kidneys and ureters: No hydronephrosis or stone.  Stable appearance
of left interpolar cortical cyst, 15 mm in diameter.

Bladder: Unremarkable.

Bowel: No obstruction. Normal appendix.

Retroperitoneum: No mass or adenopathy.

Peritoneum: No free fluid or gas.

Reproductive: IUD in unremarkable position.  The

Vascular: Aortic and iliac atherosclerosis, age advanced.  No
aortic aneurysm.

OSSEOUS: No acute abnormalities. Lower lumbar facet osteoarthritis
with spurring.
IMPRESSION: 1.  No hydronephrosis or urolithiasis.
2.  Normal appendix.
3.  11 mm left lower lobe ground-glass nodule, stable since 6936 at
least.  1-year follow-up chest CT recommended.

## 2015-03-27 ENCOUNTER — Telehealth: Payer: Self-pay | Admitting: *Deleted

## 2015-03-27 NOTE — Telephone Encounter (Signed)
Called and confirmed 03/28/15 clinic appt w/ pt.

## 2015-03-28 ENCOUNTER — Ambulatory Visit (HOSPITAL_BASED_OUTPATIENT_CLINIC_OR_DEPARTMENT_OTHER): Payer: No Typology Code available for payment source | Admitting: Internal Medicine

## 2015-03-28 ENCOUNTER — Encounter: Payer: Self-pay | Admitting: *Deleted

## 2015-03-28 ENCOUNTER — Encounter: Payer: Self-pay | Admitting: Internal Medicine

## 2015-03-28 ENCOUNTER — Ambulatory Visit: Payer: No Typology Code available for payment source | Attending: Internal Medicine | Admitting: Physical Therapy

## 2015-03-28 ENCOUNTER — Other Ambulatory Visit (HOSPITAL_BASED_OUTPATIENT_CLINIC_OR_DEPARTMENT_OTHER): Payer: No Typology Code available for payment source

## 2015-03-28 VITALS — BP 195/84 | HR 69 | Temp 98.2°F | Resp 16 | Ht 63.0 in | Wt 125.2 lb

## 2015-03-28 DIAGNOSIS — I10 Essential (primary) hypertension: Secondary | ICD-10-CM

## 2015-03-28 DIAGNOSIS — R269 Unspecified abnormalities of gait and mobility: Secondary | ICD-10-CM | POA: Insufficient documentation

## 2015-03-28 DIAGNOSIS — Z9181 History of falling: Secondary | ICD-10-CM | POA: Insufficient documentation

## 2015-03-28 DIAGNOSIS — G8929 Other chronic pain: Secondary | ICD-10-CM

## 2015-03-28 DIAGNOSIS — M5441 Lumbago with sciatica, right side: Secondary | ICD-10-CM | POA: Insufficient documentation

## 2015-03-28 DIAGNOSIS — C3432 Malignant neoplasm of lower lobe, left bronchus or lung: Secondary | ICD-10-CM

## 2015-03-28 DIAGNOSIS — Z87898 Personal history of other specified conditions: Secondary | ICD-10-CM

## 2015-03-28 LAB — COMPREHENSIVE METABOLIC PANEL
ALBUMIN: 3.6 g/dL (ref 3.5–5.0)
ALK PHOS: 90 U/L (ref 40–150)
ALT: 10 U/L (ref 0–55)
AST: 15 U/L (ref 5–34)
Anion Gap: 8 mEq/L (ref 3–11)
BUN: 12.4 mg/dL (ref 7.0–26.0)
CO2: 24 mEq/L (ref 22–29)
CREATININE: 0.7 mg/dL (ref 0.6–1.1)
Calcium: 9 mg/dL (ref 8.4–10.4)
Chloride: 107 mEq/L (ref 98–109)
EGFR: >90 mL/min/{1.73_m2} (ref >90)
GLUCOSE: 93 mg/dL (ref 70–140)
POTASSIUM: 4.1 meq/L (ref 3.5–5.1)
SODIUM: 139 meq/L (ref 136–145)
TOTAL PROTEIN: 7.2 g/dL (ref 6.4–8.3)

## 2015-03-28 LAB — CBC WITH DIFFERENTIAL/PLATELET
BASO%: 0.4 % (ref 0.0–2.0)
Basophils Absolute: 0 10*3/uL (ref 0.0–0.1)
EOS%: 4 % (ref 0.0–7.0)
Eosinophils Absolute: 0.4 10*3/uL (ref 0.0–0.5)
HCT: 35.6 % (ref 34.8–46.6)
HEMOGLOBIN: 11.8 g/dL (ref 11.6–15.9)
LYMPH#: 3.7 10*3/uL — AB (ref 0.9–3.3)
LYMPH%: 38.3 % (ref 14.0–49.7)
MCH: 32.7 pg (ref 25.1–34.0)
MCHC: 33.1 g/dL (ref 31.5–36.0)
MCV: 98.6 fL (ref 79.5–101.0)
MONO#: 0.5 10*3/uL (ref 0.1–0.9)
MONO%: 5.4 % (ref 0.0–14.0)
NEUT%: 51.9 % (ref 38.4–76.8)
NEUTROS ABS: 5 10*3/uL (ref 1.5–6.5)
Platelets: 347 10*3/uL (ref 145–400)
RBC: 3.61 10*6/uL — ABNORMAL LOW (ref 3.70–5.45)
RDW: 14.8 % — AB (ref 11.2–14.5)
WBC: 9.6 10*3/uL (ref 3.9–10.3)

## 2015-03-28 MED ORDER — CLONIDINE HCL 0.1 MG PO TABS
0.2000 mg | ORAL_TABLET | Freq: Once | ORAL | Status: AC
  Administered 2015-03-28: 0.2 mg via ORAL

## 2015-03-28 MED ORDER — CLONIDINE HCL 0.1 MG PO TABS
ORAL_TABLET | ORAL | Status: AC
  Filled 2015-03-28: qty 2
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
## 2015-03-28 NOTE — Progress Notes (Signed)
BP repeated. 195/84. Reviewed with MD, instructed pt to go to ED due to pain status. Pt refused states " I don't have time to wait over there for pain medication. " instructed pt take her BP medication when she gets home and to call her PCP for further instructions. Pt states " I take 6 different BP medications" Instructed to call PCP immediately if she is unwilling to go to ED. Pt verbalized understanding, no further concerns.

## 2015-03-28 NOTE — Progress Notes (Signed)
Oncology Nurse Navigator Documentation  Oncology Nurse Navigator Flowsheets 03/28/2015  Navigator Encounter Type Clinic/MDC/spoke with patient and her husband today at thoracic clinic.  She was having a lot of pain.  Dr. Julien Nordmann address and request she be seen at ED.  She is unsure if she would like to go to ED.   I gave and explained information on lung cancer and support services at [facility]  Patient Visit Type Initial  Treatment Phase Other  Barriers/Navigation Needs Education  Education Understanding Cancer/ Treatment Options  Interventions Education Method  Coordination of Care -  Education Method Verbal;Written  Time Spent with Patient 30
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
## 2015-03-29 ENCOUNTER — Encounter: Payer: Self-pay | Admitting: *Deleted

## 2015-03-29 ENCOUNTER — Telehealth: Payer: Self-pay | Admitting: Internal Medicine

## 2015-03-29 DIAGNOSIS — C3432 Malignant neoplasm of lower lobe, left bronchus or lung: Secondary | ICD-10-CM

## 2015-03-29 NOTE — Telephone Encounter (Signed)
lvm for pt regarding to [facility] appt.Marland KitchenMarland KitchenMarland Kitchen

## 2015-04-23 ENCOUNTER — Telehealth: Payer: Self-pay | Admitting: *Deleted

## 2015-04-23 NOTE — Telephone Encounter (Signed)
Oncology Nurse Navigator Documentation  Oncology Nurse Navigator Flowsheets 04/23/2015  Navigator Location [facility]  Navigator Encounter Type Telephone/called to follow up with her from thoracic clinic.  I left my name and phone number to call  Abnormal Finding Date 11/20/2014  Confirmed Diagnosis Date 02/13/2015  Surgery Date 02/13/2015  Treatment Initiated Date 02/13/2015  Patient Visit Type Follow-up  Treatment Phase Other  Acuity Level 1  Time Spent with Patient 15

## 2015-05-06 ENCOUNTER — Other Ambulatory Visit: Payer: Self-pay | Admitting: Thoracic Surgery (Cardiothoracic Vascular Surgery)

## 2015-05-06 DIAGNOSIS — C3490 Malignant neoplasm of unspecified part of unspecified bronchus or lung: Secondary | ICD-10-CM

## 2015-05-07 ENCOUNTER — Ambulatory Visit (INDEPENDENT_AMBULATORY_CARE_PROVIDER_SITE_OTHER): Payer: Self-pay | Admitting: Thoracic Surgery (Cardiothoracic Vascular Surgery)

## 2015-05-07 ENCOUNTER — Encounter: Payer: Self-pay | Admitting: Thoracic Surgery (Cardiothoracic Vascular Surgery)

## 2015-05-07 ENCOUNTER — Ambulatory Visit
Admission: RE | Admit: 2015-05-07 | Discharge: 2015-05-07 | Disposition: A | Discharge status: Home / Self Care | Payer: No Typology Code available for payment source | Source: Ambulatory Visit | Attending: Thoracic Surgery (Cardiothoracic Vascular Surgery) | Admitting: Thoracic Surgery (Cardiothoracic Vascular Surgery)

## 2015-05-07 VITALS — BP 174/95 | HR 113 | Resp 16 | Ht 63.0 in | Wt 125.0 lb

## 2015-05-07 DIAGNOSIS — C3490 Malignant neoplasm of unspecified part of unspecified bronchus or lung: Secondary | ICD-10-CM

## 2015-05-07 DIAGNOSIS — Z09 Encounter for follow-up examination after completed treatment for conditions other than malignant neoplasm: Secondary | ICD-10-CM

## 2015-05-07 DIAGNOSIS — C3492 Malignant neoplasm of unspecified part of left bronchus or lung: Secondary | ICD-10-CM
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
## 2015-05-17 ENCOUNTER — Telehealth: Payer: Self-pay | Admitting: *Deleted

## 2015-05-17 NOTE — Telephone Encounter (Signed)
Oncology Nurse Navigator Documentation  Oncology Nurse Navigator Flowsheets 05/17/2015  Navigator Encounter Type Telephone/I called to check on patient and see how she was doing.  She is on observation at this time.  I left vm message to call if needed.   Telephone Outgoing Call  Abnormal Finding Date 11/20/2014  Confirmed Diagnosis Date 02/13/2015  Surgery Date 02/13/2015  Treatment Initiated Date 02/13/2015  Treatment Phase Other  Interventions Other  Acuity Level 1  Time Spent with Patient 15

## 2015-05-18 ENCOUNTER — Emergency Department (HOSPITAL_COMMUNITY)
Admission: EM | Admit: 2015-05-18 | Discharge: 2015-05-18 | Disposition: A | Discharge status: Home / Self Care | Payer: No Typology Code available for payment source | Attending: Emergency Medicine | Admitting: Emergency Medicine

## 2015-05-18 ENCOUNTER — Encounter (HOSPITAL_COMMUNITY): Payer: Self-pay | Admitting: Emergency Medicine

## 2015-05-18 DIAGNOSIS — Z859 Personal history of malignant neoplasm, unspecified: Secondary | ICD-10-CM | POA: Insufficient documentation

## 2015-05-18 DIAGNOSIS — Z7982 Long term (current) use of aspirin: Secondary | ICD-10-CM | POA: Insufficient documentation

## 2015-05-18 DIAGNOSIS — I252 Old myocardial infarction: Secondary | ICD-10-CM | POA: Insufficient documentation

## 2015-05-18 DIAGNOSIS — I2510 Atherosclerotic heart disease of native coronary artery without angina pectoris: Secondary | ICD-10-CM | POA: Insufficient documentation

## 2015-05-18 DIAGNOSIS — Z86018 Personal history of other benign neoplasm: Secondary | ICD-10-CM | POA: Insufficient documentation

## 2015-05-18 DIAGNOSIS — F419 Anxiety disorder, unspecified: Secondary | ICD-10-CM | POA: Insufficient documentation

## 2015-05-18 DIAGNOSIS — Z8673 Personal history of transient ischemic attack (TIA), and cerebral infarction without residual deficits: Secondary | ICD-10-CM | POA: Insufficient documentation

## 2015-05-18 DIAGNOSIS — I10 Essential (primary) hypertension: Secondary | ICD-10-CM | POA: Insufficient documentation

## 2015-05-18 DIAGNOSIS — Z951 Presence of aortocoronary bypass graft: Secondary | ICD-10-CM | POA: Insufficient documentation

## 2015-05-18 DIAGNOSIS — G8929 Other chronic pain: Secondary | ICD-10-CM | POA: Insufficient documentation

## 2015-05-18 DIAGNOSIS — Z87448 Personal history of other diseases of urinary system: Secondary | ICD-10-CM | POA: Insufficient documentation

## 2015-05-18 DIAGNOSIS — Z79899 Other long term (current) drug therapy: Secondary | ICD-10-CM | POA: Insufficient documentation

## 2015-05-18 DIAGNOSIS — R002 Palpitations: Secondary | ICD-10-CM | POA: Insufficient documentation

## 2015-05-18 DIAGNOSIS — R55 Syncope and collapse: Secondary | ICD-10-CM

## 2015-05-18 DIAGNOSIS — I509 Heart failure, unspecified: Secondary | ICD-10-CM | POA: Insufficient documentation

## 2015-05-18 DIAGNOSIS — F319 Bipolar disorder, unspecified: Secondary | ICD-10-CM | POA: Insufficient documentation

## 2015-05-18 DIAGNOSIS — Z87891 Personal history of nicotine dependence: Secondary | ICD-10-CM | POA: Insufficient documentation

## 2015-05-18 DIAGNOSIS — E785 Hyperlipidemia, unspecified: Secondary | ICD-10-CM | POA: Insufficient documentation

## 2015-05-18 MED ORDER — OXYCODONE-ACETAMINOPHEN 5-325 MG PO TABS
1.0000 | ORAL_TABLET | Freq: Once | ORAL | Status: AC
  Administered 2015-05-18: 1 via ORAL
  Filled 2015-05-18: qty 1

## 2015-05-18 NOTE — Discharge Instructions (Signed)
Take Tylenol as directed for pain. Get your blood pressure rechecked in a week. You can go to an urgent care center if he can't get in to see your primary care physician. Take your blood pressure medication when you get home this morning. Keep your appointment with your primary care physician 06/03/2015

## 2015-05-18 NOTE — ED Notes (Signed)
Pt here visiting another pt. Was already sitting in the lobby when first shift came in. Pt c/o headache and flutter in chest onset since when she first got her with another pt.
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
## 2015-05-18 NOTE — ED Notes (Signed)
Pt verbalizes understanding of instructions.

## 2015-05-18 NOTE — ED Notes (Signed)
Once ED doctor read EKG and pt was told that the doctor said the EKG was fine, pt reports, "Ok good. Then I don't need to go in the back. I can just deal with the pain." Explained to pt just because EKG may be fine still should get evaluated.

## 2015-05-31 ENCOUNTER — Telehealth: Payer: Self-pay | Admitting: Internal Medicine

## 2015-05-31 DIAGNOSIS — Z736 Limitation of activities due to disability: Secondary | ICD-10-CM

## 2015-05-31 NOTE — Telephone Encounter (Signed)
APPT REMINDER CALL, LMTCB IF SHE NEEDS TO CANCEL °

## 2015-06-03 ENCOUNTER — Ambulatory Visit (INDEPENDENT_AMBULATORY_CARE_PROVIDER_SITE_OTHER): Payer: No Typology Code available for payment source | Admitting: Internal Medicine

## 2015-06-03 ENCOUNTER — Encounter: Payer: Self-pay | Admitting: Internal Medicine

## 2015-06-03 VITALS — BP 210/101 | HR 100 | Temp 98.0°F | Wt 126.0 lb

## 2015-06-03 DIAGNOSIS — G894 Chronic pain syndrome: Secondary | ICD-10-CM

## 2015-06-03 DIAGNOSIS — Z0000 Encounter for general adult medical examination without abnormal findings: Secondary | ICD-10-CM

## 2015-06-03 DIAGNOSIS — Z9114 Patient's other noncompliance with medication regimen: Secondary | ICD-10-CM

## 2015-06-03 DIAGNOSIS — I10 Essential (primary) hypertension: Secondary | ICD-10-CM

## 2015-06-03 MED ORDER — KETOROLAC TROMETHAMINE 30 MG/ML IJ SOLN
60.0000 mg | Freq: Once | INTRAMUSCULAR | Status: AC
  Administered 2015-06-03: 60 mg via INTRAMUSCULAR

## 2015-06-03 MED ORDER — HYDROCODONE-ACETAMINOPHEN 5-325 MG PO TABS: 1.0000 | ORAL_TABLET | Freq: Four times a day (QID) | ORAL | Status: DC | PRN

## 2015-06-03 MED ORDER — LISINOPRIL-HYDROCHLOROTHIAZIDE 20-12.5 MG PO TABS: 2.0000 | ORAL_TABLET | Freq: Every day | ORAL | Status: DC

## 2015-06-03 MED ORDER — AMLODIPINE BESYLATE 10 MG PO TABS: 10.0000 mg | ORAL_TABLET | Freq: Every day | ORAL | Status: DC

## 2015-06-03 MED ORDER — METOPROLOL TARTRATE 25 MG PO TABS: 25.0000 mg | ORAL_TABLET | Freq: Two times a day (BID) | ORAL | Status: DC

## 2015-06-03 MED ORDER — CYCLOBENZAPRINE HCL 5 MG PO TABS: 5.0000 mg | ORAL_TABLET | Freq: Three times a day (TID) | ORAL | Status: DC | PRN

## 2015-06-03 MED ORDER — GABAPENTIN 300 MG PO CAPS: 600.0000 mg | ORAL_CAPSULE | Freq: Three times a day (TID) | ORAL | Status: DC

## 2015-06-03 MED FILL — HYDROCODON-APAP 5-325: 5-325 | 30 days supply | Qty: 120 | Fill #0

## 2015-06-03 NOTE — Patient Instructions (Addendum)
-  Will give you toradol shot today for your pain -Will refer you back to [facility] to see neurosurgery  -Will give you vicodin to take every 6 hrs as needed for pain. Don't get pain meds from anyone else.  -Also refilled your flexeril  -Start taking your blood pressure medications, I sent refills to your pharmacy -Please come back in May to revaluate your pain and bring your pill bottles -Nice seeing you today!  General Instructions:   Please bring your medicines with you each time you come to clinic.  Medicines may include prescription medications, over-the-counter medications, herbal remedies, eye drops, vitamins, or other pills.   Progress Toward Treatment Goals:  Treatment Goal 03/19/2014  Blood pressure deteriorated  Stop smoking smoking the same amount  Prevent falls improved    Self Care Goals & Plans:  Self Care Goal 03/15/2015  Manage my medications take my medicines as prescribed; bring my medications to every visit; refill my medications on time  Monitor my health -  Eat healthy foods eat more vegetables; eat foods that are low in salt; eat baked foods instead of fried foods  Be physically active find an activity I enjoy  Prevent falls -    No flowsheet data found.   Care Management & Community Referrals:  Referral 03/19/2014  Referrals made for care management support none needed  Referrals made to community resources -
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
## 2015-06-04 NOTE — Assessment & Plan Note (Addendum)
Assessment: Pt with chronic low back pain with right-sided sciatica with lastMRI on 04/20/14 revealing persistent advanced facet arthropathy of lower lumbar spine with associated marrow edema and possible right L4 nerve root impingement as well as chronic neck pain with right hand paraesthesias with last MRI on 11/02/13 with multifactorial degenerative cervical stenosis and arthropathy with moderate spinal cord mass effect of C6-7 who presents with uncontrolled pain on current pain regimen with no alarm symptoms.   Plan:  -Due to extensive cervical and spinal pathology with spinal cord inflammation and mass effect and uncontrolled pain will give pt another chance to receive narcotics from our clinic with very close monitoring to ensure there is no abuse, last UDS on 02/07/15 was appropriate for narcotics prescribed -Refer to neurosurgery at [facility] regarding if she is a surgical candidate -Pt given toradol IM 60 mg today in clinic for uncontrolled pain -Prescribe norco/vicodin 5-325 mg Q 6 hr PRN pain (#120), pt given 3 prescriptions to last until end of May 2017. Pt stated she would not obtain narcotics from any other provider.  -Continue gabapentin 600 mg TID, voltaren 1% gel QID PRN pain, and flexeril 5 mg TID PRN muscle spasms -Pt no longer follows with PM&R (Dr. Letta Pate) due to ineffectiveness of medical therapy and does not want to reestablish care with him or any other pain clinic -Pt to return in 3 months for evaluation of pain and function by me her PCP. Hopefully will have been seen by neurosurgery by that time and will determine if needs to be on a pain contract.

## 2015-06-04 NOTE — Assessment & Plan Note (Signed)
Assessment: Pt with uncontrolled hypertension non-compliant with four-class (CCB, ACEi, and BB) anti-hypertensive therapy for months who presents with blood pressure of 210/101 with no evidence of end-organ damage.    Plan:  -BP 210/101 not at goal <140/90 in setting of non-compliance and uncontrolled pain -Pt given toradol IM 60 mg today in clinic for uncontrolled pain -Continue amlodipine 10 mg daily, lopressor 25 mg BID, and lisinopril-HCTZ 40-25 mg daily  -Last CMP on 03/18/15 was normal   -Pt to return in 1 week for blood pressure recheck

## 2015-06-04 NOTE — Assessment & Plan Note (Signed)
-  Inquire regarding screening mammogram at next visit  -Obtain A1c at next visit

## 2015-06-05 NOTE — Progress Notes (Signed)
Internal Medicine Clinic Attending  Case discussed with Dr. Rabbani soon after the resident saw the patient.  We reviewed the resident's history and exam and pertinent patient test results.  I agree with the assessment, diagnosis, and plan of care documented in the resident's note.  

## 2015-06-07 ENCOUNTER — Telehealth: Payer: Self-pay | Admitting: *Deleted

## 2015-06-07 NOTE — Telephone Encounter (Signed)
PATIENT WAS CONTACTED WITH THIS APPOINTMENT FOR [facility].

## 2015-06-11 ENCOUNTER — Telehealth: Payer: Self-pay | Admitting: Licensed Clinical Social Worker

## 2015-06-11 NOTE — Telephone Encounter (Signed)
CSW received voice mail from Ms. Brenda [name].  Pt confirmed her address and provided updated telephone contact numbers.  Ms. Brenda [name] requesting letter from her PCP indicating her need for bed rest due to pain.  Pt stated at this time she is being requested to attend a lengthy "class to learn how to clean".  Pt states she is in pain and would find sitting in class too difficulty.  Ms. Brenda [name] requesting this letter to be faxed to [place].  Pt called back requesting the initiation of a SCAT application on her behalf. CSW return call to Ms. Brenda [name].  CSW left message stating letter request will be forwarded to PCP.  Based on PCP determination to provide letter, letter will be faxed to [facility] if signed release is on chart.  CSW will initiate SCAT application and forwarded to PCP.  Part A will be mailed to Ms. Brenda [name] with Part B release.

## 2015-06-13 ENCOUNTER — Encounter: Payer: Self-pay | Admitting: Licensed Clinical Social Worker

## 2015-06-13 ENCOUNTER — Encounter: Payer: Self-pay | Admitting: Internal Medicine

## 2015-06-13 NOTE — Telephone Encounter (Signed)
Yes will provide the letter and give it to you today to fax in, thanks!   Dr. Naaman Plummer

## 2015-07-01 MED FILL — HYDROCODON-APAP 5-325: 5-325 | 30 days supply | Qty: 120 | Fill #0

## 2015-07-05 ENCOUNTER — Telehealth: Payer: Self-pay | Admitting: Internal Medicine

## 2015-07-05 ENCOUNTER — Encounter: Payer: Self-pay | Admitting: Licensed Clinical Social Worker

## 2015-07-05 NOTE — Telephone Encounter (Signed)
Pt calls and states she needs something stronger than vicodin for pain, she is going to have surgery on her neck in appr 6-8 wks, first she will need oncology clearance. She will be having PT until surgery, she suggest percocet.

## 2015-07-05 NOTE — Telephone Encounter (Signed)
I gave her 3 prescriptions for norco/vicodin 5-325 mg Q 6 hr PRN pain (#120) on 06/03/15 to last until end of May 2017. I will not be able to give her percocet, please let her know. And she is not supposed to get narcotics from other providers.    Dr. Naaman Plummer

## 2015-07-05 NOTE — Telephone Encounter (Signed)
Requesting for refill.

## 2015-07-08 ENCOUNTER — Telehealth: Payer: Self-pay

## 2015-07-08 ENCOUNTER — Telehealth: Payer: Self-pay | Admitting: Internal Medicine

## 2015-07-08 ENCOUNTER — Telehealth: Payer: Self-pay | Admitting: Medical Oncology

## 2015-07-08 NOTE — Telephone Encounter (Signed)
LVM for patient

## 2015-07-08 NOTE — Telephone Encounter (Signed)
Sorry will not be able to give her any more pain medications at this time. Please let her know, thanks!  Dr. Naaman Plummer

## 2015-07-08 NOTE — Telephone Encounter (Signed)
Call back from patient-Wanted to let pcp know that she was seen by [facility] Orthopedic(?) and was told she will need surgery. They had wanted pt to go to PT, but pt states she is unable to pay the $85 copay.  Pt also states her "pain pills" are no longer working for her. Having continued pain neck, back, and legs. Was given 3 rxs for vicodin during 02/20 OV and still has one printed rx left. Pt states she took the remaining pills from her 2nd refill and turned them for disposal at a fire station.  Also states during 02/20 OV with pcp, she was asked if she preferred vicodin or percocet. Pt was given vicodin and now wishes to obtain rx for percocet.  Will send info to pcp for review, please advise.Brenda Hidden Cassady3/27/20171:04 PM  (Please see Care Everywhere for [facility] appt)  (Of note, pt will follow up this week with [facility] MD to follow up her blood pressure.)

## 2015-07-08 NOTE — Telephone Encounter (Signed)
APPT. REMINDER CALL, LMTCB °

## 2015-07-08 NOTE — Telephone Encounter (Signed)
Call made to patient to inform her that DrRabbani, will not be able to prescribe any more medications for pain-message left on recorder. Message also left for pt to f/u on Wednesday @ 8:45am  for a bp check.Despina Hidden Cassady3/27/20174:20 PM

## 2015-07-08 NOTE — Telephone Encounter (Signed)
Pt would like to speak to nurse

## 2015-07-08 NOTE — Telephone Encounter (Signed)
Pt called stating she needs medical clearance prior to surgery for neck. I told pt to contact PCP since she is under observation.

## 2015-07-08 NOTE — Telephone Encounter (Signed)
Patient calling for clearance to have surgery at [facility].  Message forwarded to Arnoldo Lenis., RN

## 2015-07-10 ENCOUNTER — Ambulatory Visit: Payer: No Typology Code available for payment source | Admitting: Internal Medicine

## 2015-07-10 ENCOUNTER — Encounter: Payer: Self-pay | Admitting: Internal Medicine

## 2015-07-23 ENCOUNTER — Telehealth: Payer: Self-pay | Admitting: Licensed Clinical Social Worker

## 2015-07-23 NOTE — Telephone Encounter (Signed)
CSW received voice mail from Ms. Ebner. Pt requesting this worker's assistance obtaining the [facility] Application.  Pt states she has attempted to contact [facility] but has been unable to reach the appropriate department.  Ms. Antenucci has a scheduled [facility] financial counselor appt on 4/19 and would like to complete the [facility] form at that time.  CSW returned call to Ms. Hoar, pt notified [facility] financial assistance application found online and printed.  Form will be provided to D. Hill.  Ms. Kassa informed this worker of current medical status regarding surgical recommendations and difficulty affording medications even through [facility].  Pt continuing to appeal for government medical care.

## 2015-07-24 IMAGING — CR DG CHEST 2V
2 series · 2 of 2 positions shown · non-contrast
Comparison: 08/07/2012.

CLINICAL DATA: Productive cough for 2 days.

EXAM:
CHEST  2 VIEW

[w chest pa]
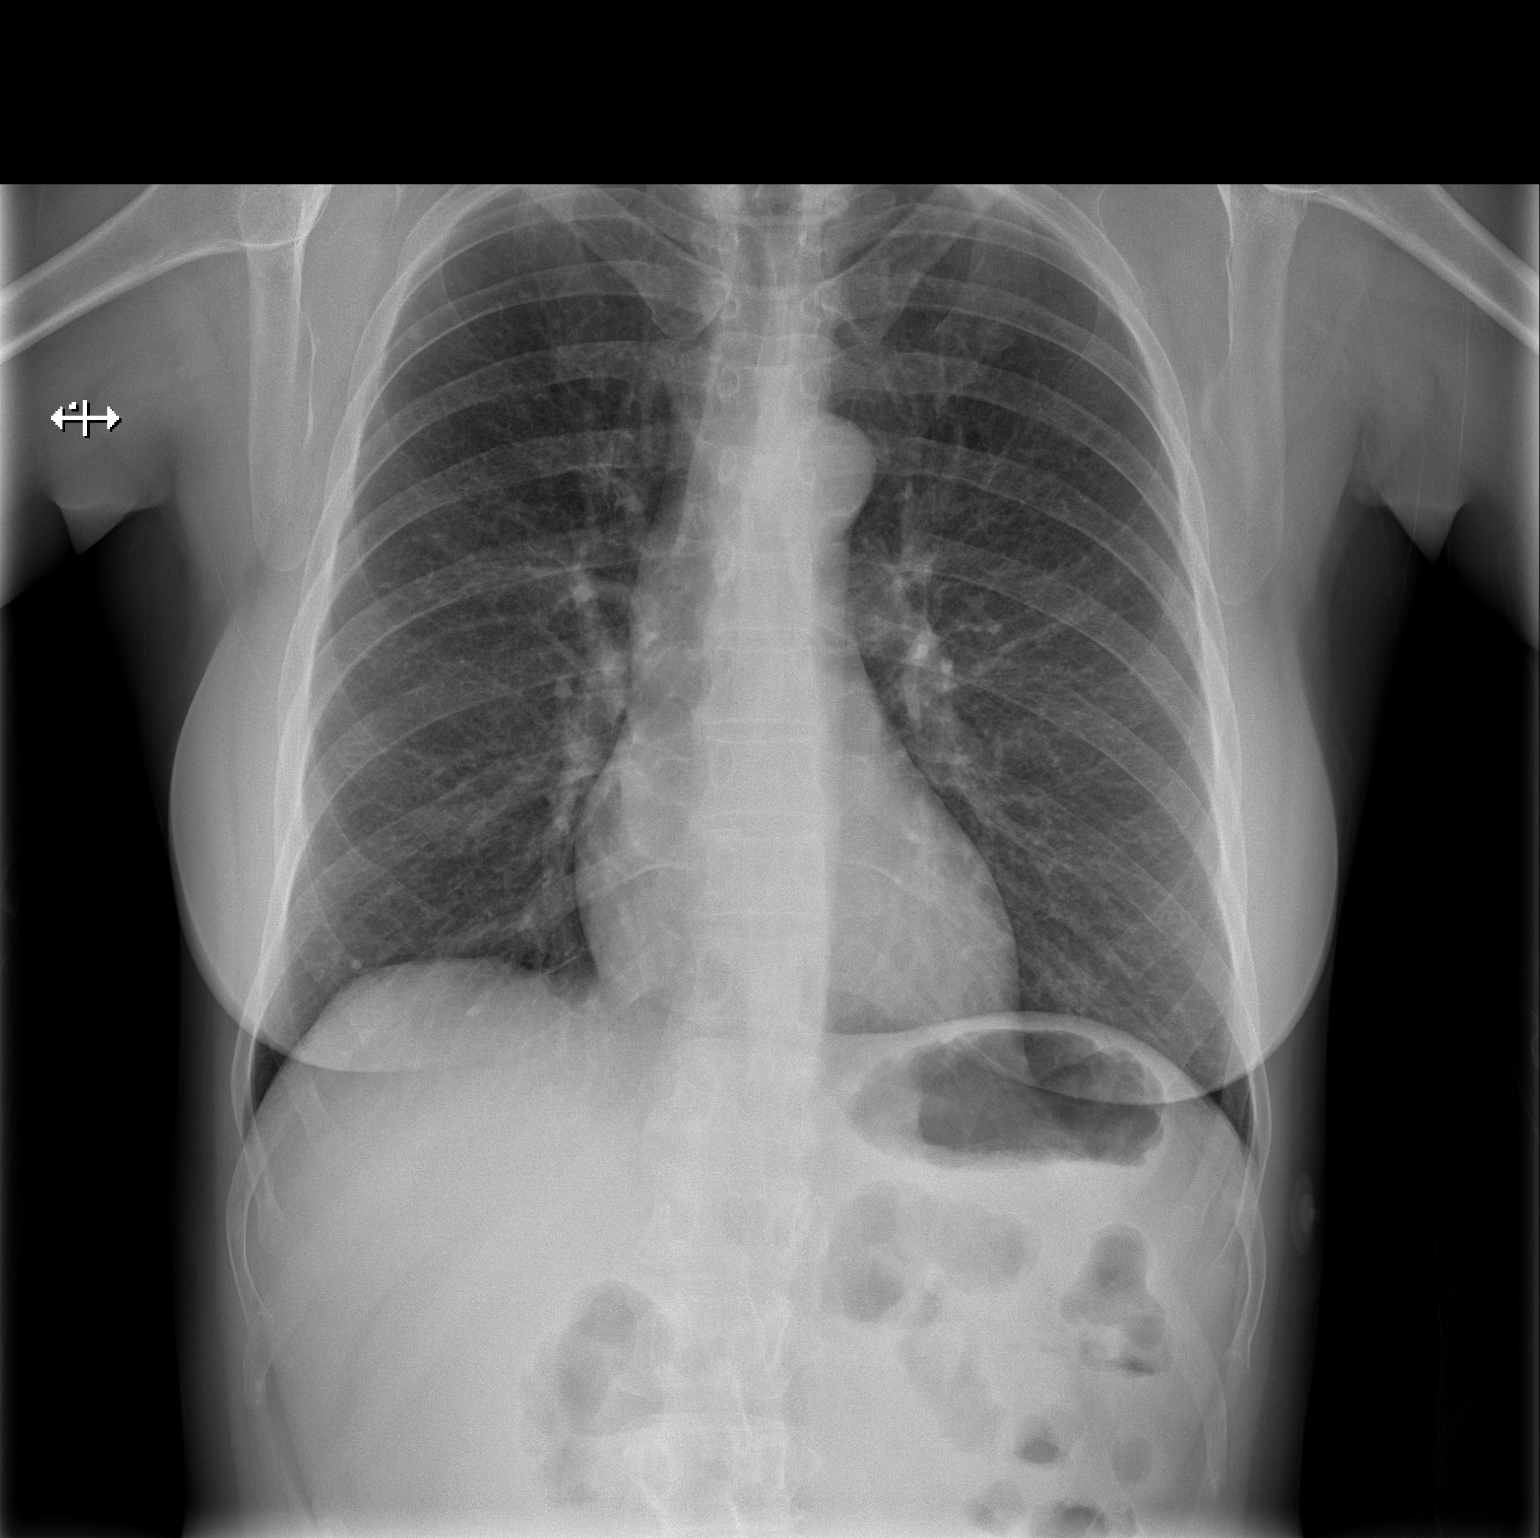

[w chest lat]
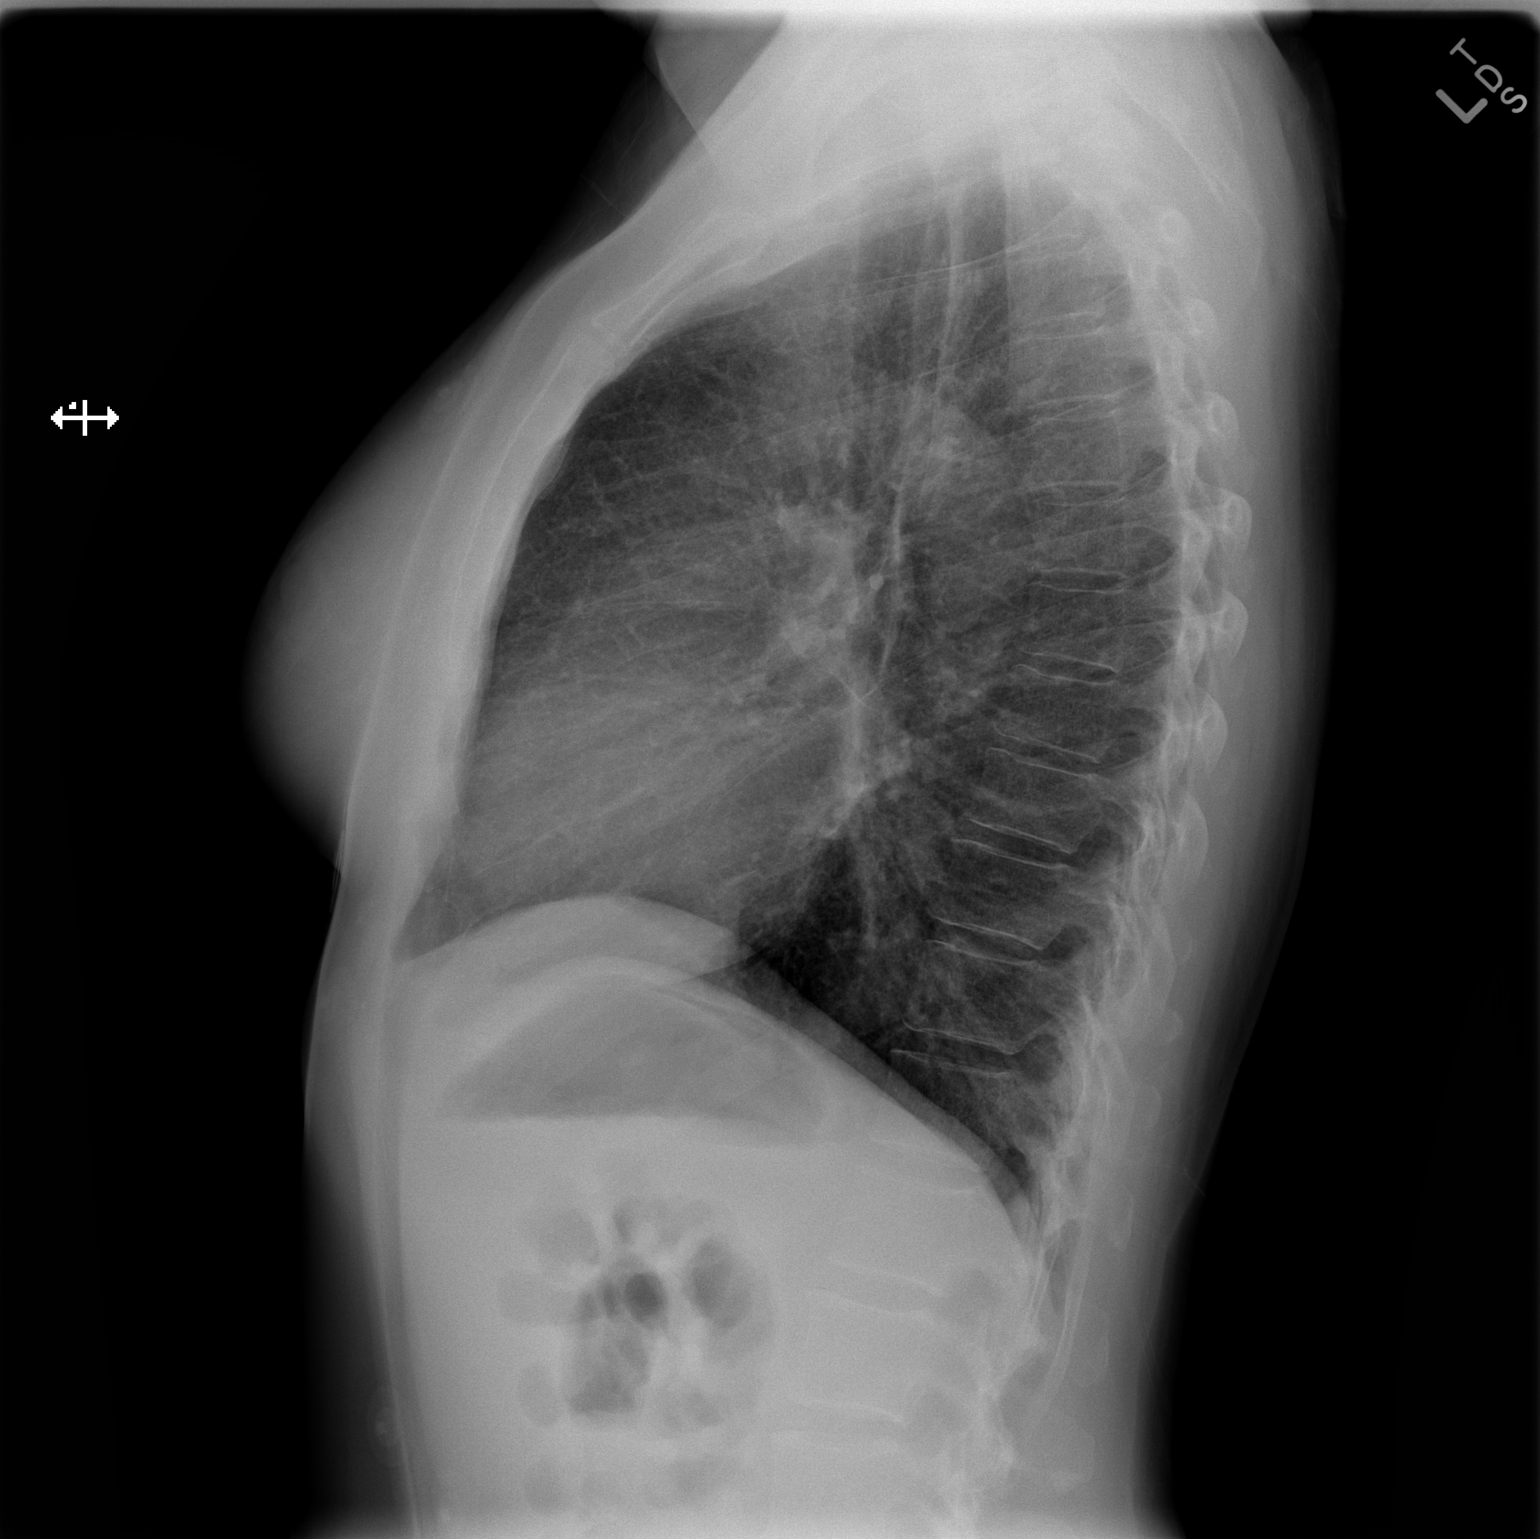

[2 of 2 positions shown; findings below may reference images not displayed]

FINDINGS: The heart size and mediastinal contours are within normal limits.
Both lungs are clear. The visualized skeletal structures are
unremarkable.
IMPRESSION: No active cardiopulmonary disease.

## 2015-07-25 IMAGING — CT CT ABD-PELV W/ CM
3 of 8 series · 13 of 46 positions shown, 19 images · IV contrast (CONTRAST)
Comparison: CT scan dated 10/25/2012

CLINICAL DATA: Increasing right sided abdominal and flank pain.
Hematuria. Recent fever.

EXAM:
CT ABDOMEN AND PELVIS WITH CONTRAST
TECHNIQUE: Multidetector CT imaging of the abdomen and pelvis was performed
using the standard protocol following bolus administration of
intravenous contrast.
CONTRAST:  100mL OMNIPAQUE IOHEXOL 300 MG/ML  SOLN

[Series 5: routine · axial · 0.68mm/px · z∈[+303,+563]mm · 4 of 88 slices shown, 9 images]
[im 18/88  soft-tissue]
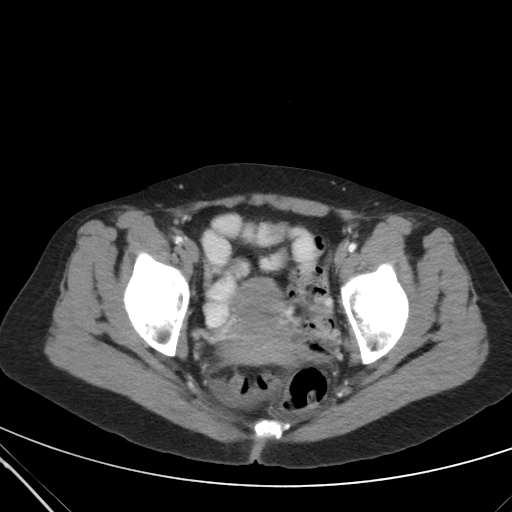
[im 18/88  lung]
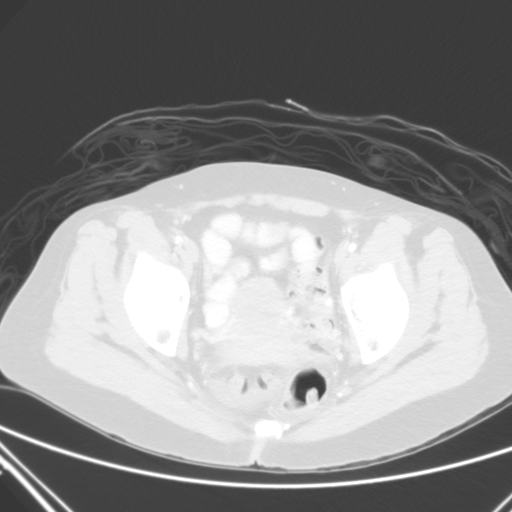
[im 18/88  bone]
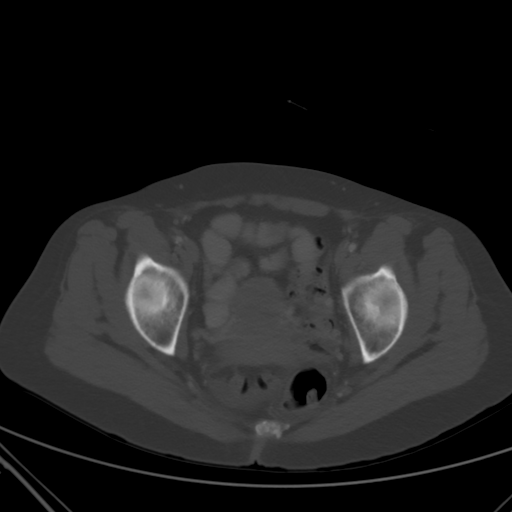
[im 35/88  soft-tissue]
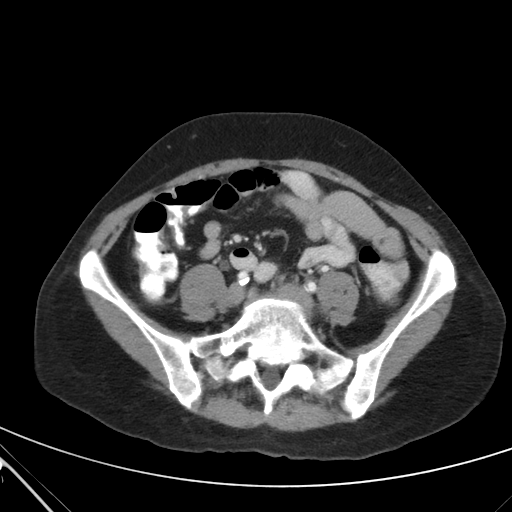
[im 35/88  lung]
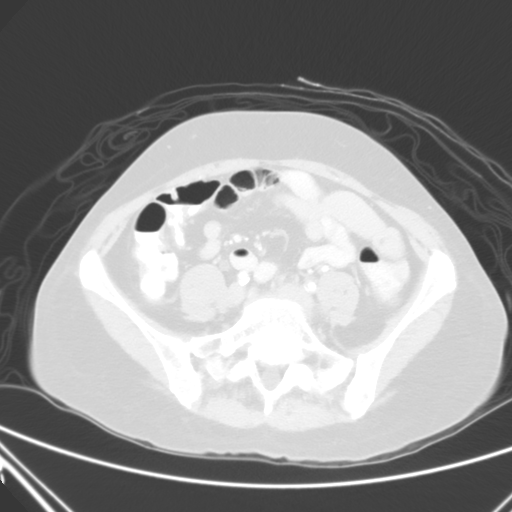
[im 53/88  soft-tissue]
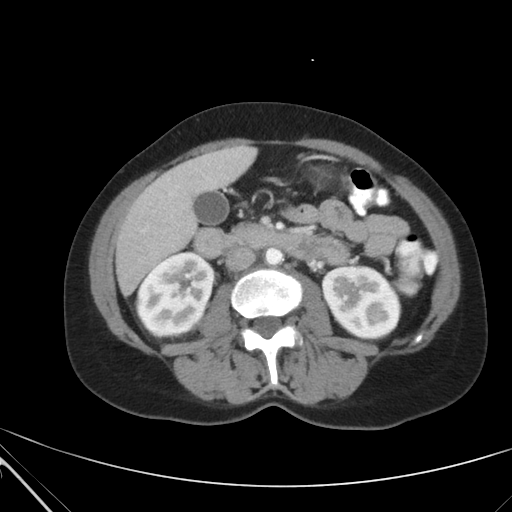
[im 53/88  lung]
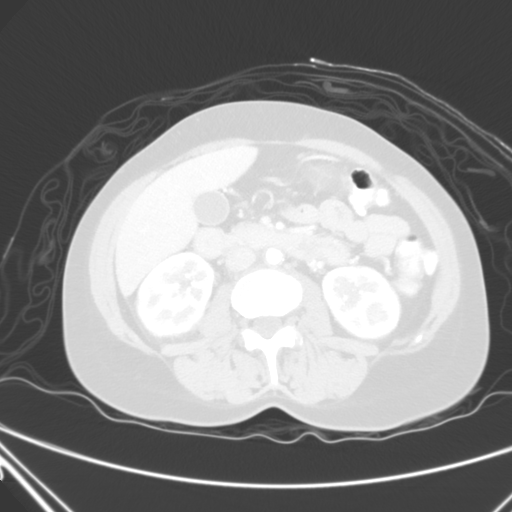
[im 70/88  soft-tissue]
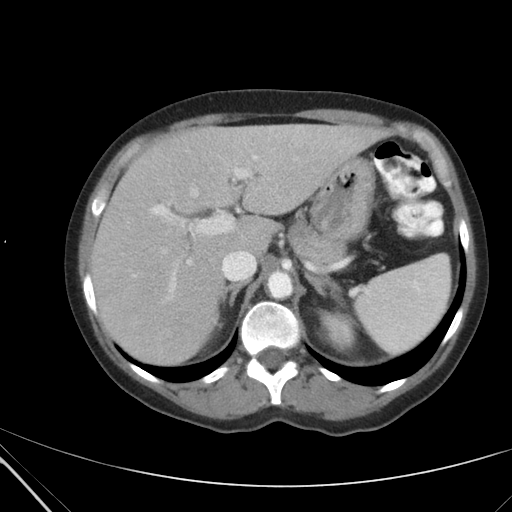
[im 70/88  lung]
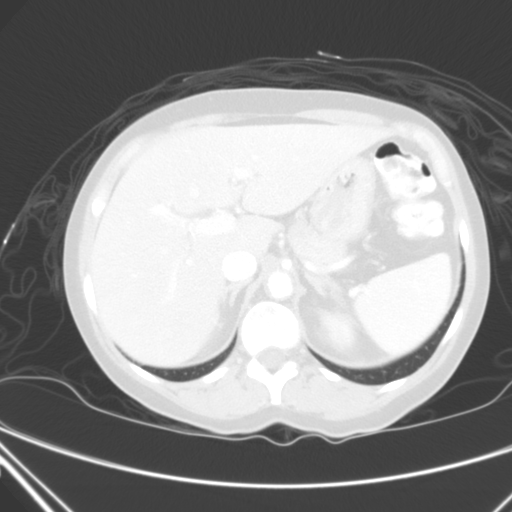

[kidney delay · axial · delayed · 0.68mm/px · z∈[+434,+528]mm · 6 of 331 slices shown]
[im 32/331  soft-tissue]
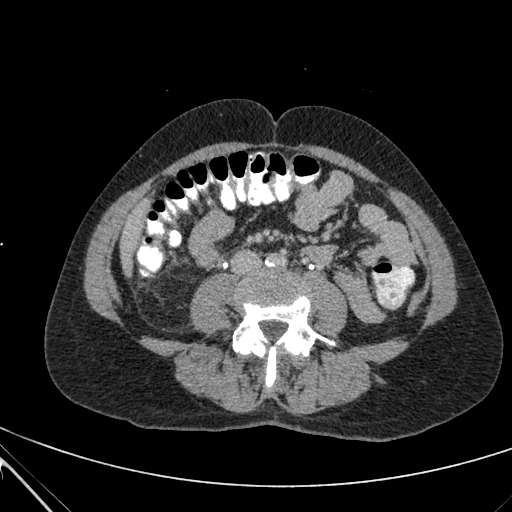
[im 63/331  soft-tissue]
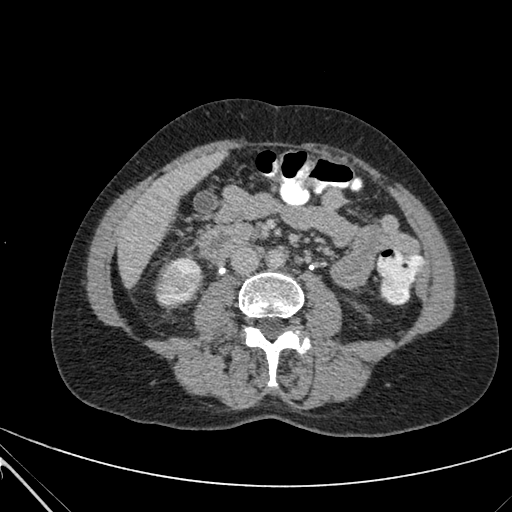
[im 111/331  soft-tissue]
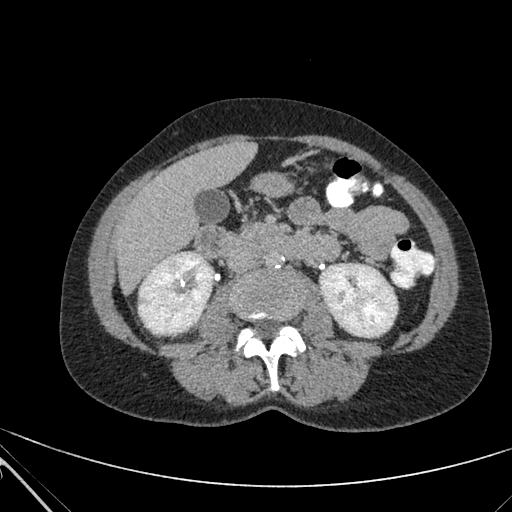
[im 142/331  soft-tissue]
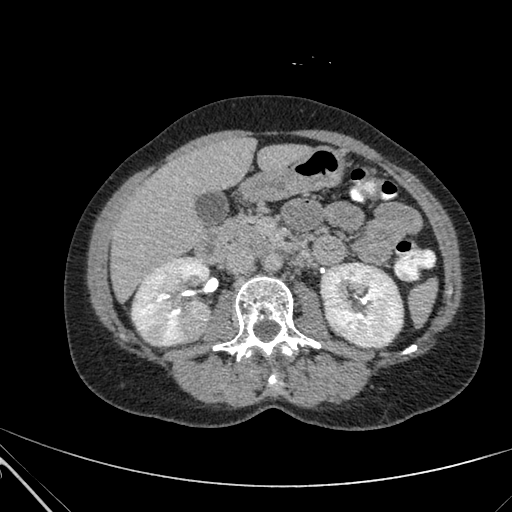
[im 189/331  soft-tissue]
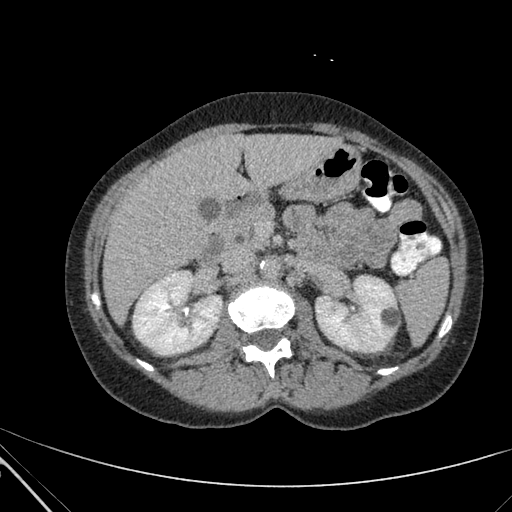
[im 221/331  soft-tissue]
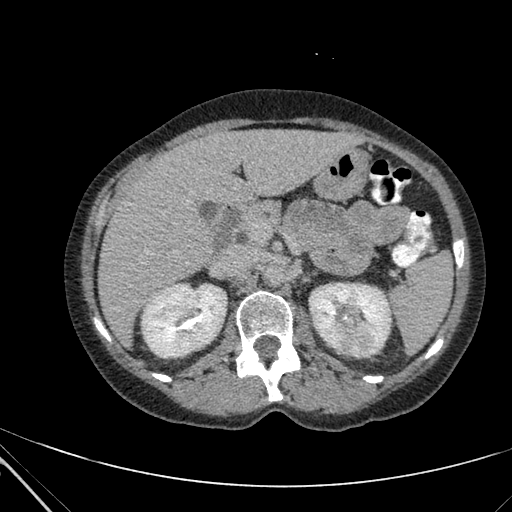

[coronal delay · coronal · delayed · 0.68mm/px · 3 of 111 slices shown, 4 images]
[im 23/111  soft-tissue]
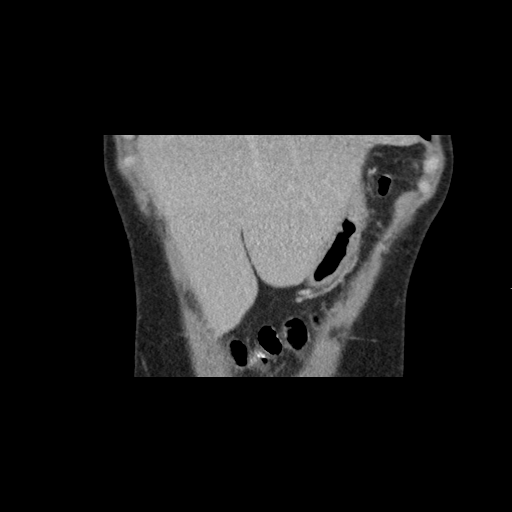
[im 45/111  soft-tissue]
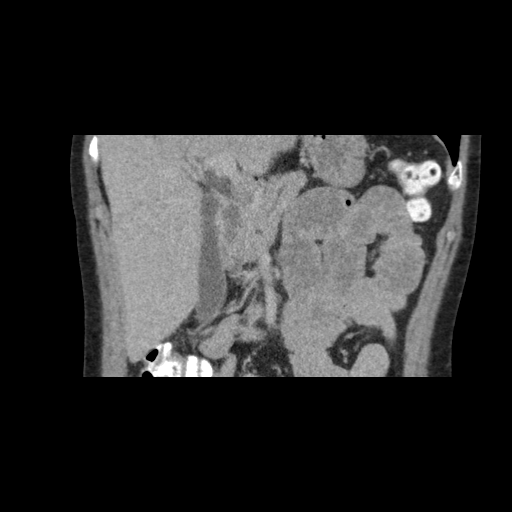
[im 45/111  bone]
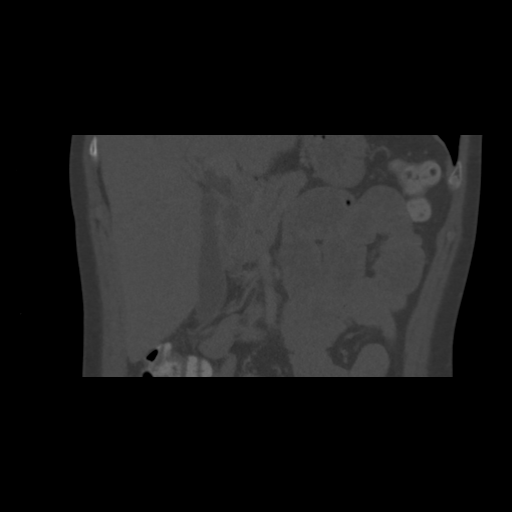
[im 67/111  soft-tissue]
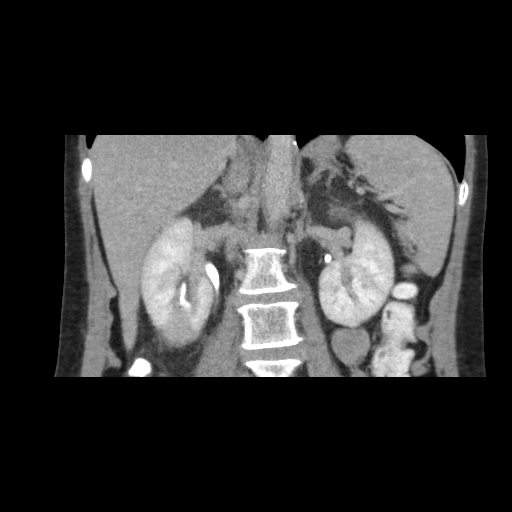

[13 of 46 positions shown; findings below may reference images not displayed]

FINDINGS: The patient has bilateral multi focal pyelonephritis. There is
slight soft tissue stranding around the upper pole of the left
kidney and diffusely around the right kidney. There is no renal
obstruction. No stones. There is a simple 16 mm cyst in the lateral
aspect of the mid left kidney.

There is slight prominence of intrahepatic bile ducts and common
bile duct. Common bile duct measures 7 mm in diameter. The ducts are
slightly prominent on the prior study. Is the patient's bilirubin
normal? No visible gallstones.

Spleen, pancreas, and adrenal glands are normal.

The bowel is normal. Uterus and ovaries are normal. Small amount of
free fluid in the pelvis. No osseous abnormality. Lung bases are
clear. Bladder appears normal.
IMPRESSION: 1. Multifocal bilateral pyelonephritis.
2. Slight prominence of the bile ducts, minimally increased since
the prior study of 05/11/2011. Is the patient's bilirubin normal?

## 2015-07-31 ENCOUNTER — Ambulatory Visit: Payer: No Typology Code available for payment source

## 2015-07-31 MED FILL — HYDROCODON-APAP 5-325: 5-325 | 30 days supply | Qty: 120 | Fill #0

## 2015-08-30 ENCOUNTER — Encounter: Payer: Self-pay | Admitting: Internal Medicine

## 2015-08-30 ENCOUNTER — Ambulatory Visit (INDEPENDENT_AMBULATORY_CARE_PROVIDER_SITE_OTHER): Payer: No Typology Code available for payment source | Admitting: Internal Medicine

## 2015-08-30 VITALS — BP 176/83 | HR 98 | Wt 129.3 lb

## 2015-08-30 DIAGNOSIS — Z79891 Long term (current) use of opiate analgesic: Secondary | ICD-10-CM

## 2015-08-30 DIAGNOSIS — M5441 Lumbago with sciatica, right side: Secondary | ICD-10-CM

## 2015-08-30 DIAGNOSIS — Z0000 Encounter for general adult medical examination without abnormal findings: Secondary | ICD-10-CM

## 2015-08-30 DIAGNOSIS — I10 Essential (primary) hypertension: Secondary | ICD-10-CM

## 2015-08-30 DIAGNOSIS — G894 Chronic pain syndrome: Secondary | ICD-10-CM

## 2015-08-30 MED ORDER — OXYCODONE-ACETAMINOPHEN 10-325 MG PO TABS: 1.0000 | ORAL_TABLET | Freq: Three times a day (TID) | ORAL | Status: DC | PRN

## 2015-08-30 MED FILL — OXYCODONE-APAP 10-325 TAB: 10-325 | 30 days supply | Qty: 90 | Fill #0

## 2015-08-30 NOTE — Patient Instructions (Signed)
-  Will give you percocet until you can see the pain clinic - please call [facility] [phone]  Address. [address]; [place], [place] -Follow up with [place] in 3 months -Pleasure being your doctor!  General Instructions:   Please bring your medicines with you each time you come to clinic.  Medicines may include prescription medications, over-the-counter medications, herbal remedies, eye drops, vitamins, or other pills.   Progress Toward Treatment Goals:  Treatment Goal 03/19/2014  Blood pressure deteriorated  Stop smoking smoking the same amount  Prevent falls improved    Self Care Goals & Plans:  Self Care Goal 03/15/2015  Manage my medications take my medicines as prescribed; bring my medications to every visit; refill my medications on time  Monitor my health -  Eat healthy foods eat more vegetables; eat foods that are low in salt; eat baked foods instead of fried foods  Be physically active find an activity I enjoy  Prevent falls -    No flowsheet data found.   Care Management & Community Referrals:  Referral 03/19/2014  Referrals made for care management support none needed  Referrals made to community resources -
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
## 2015-08-31 NOTE — Assessment & Plan Note (Signed)
Assessment: Pt with uncontrolled hypertension with reported compliance with four-class (CCB, ACEi, and BB) anti-hypertensive therapy who presents with improved blood pressure of 176/83.   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

## 2015-08-31 NOTE — Assessment & Plan Note (Addendum)
Assessment: Pt with chronic low back pain with right-sided sciatica with lastMRI on 04/20/14 revealing persistent advanced facet arthropathy of lower lumbar spine with associated marrow edema and possible right L4 nerve root impingement as well as chronic neck pain with right hand paraesthesias with last MRI on 11/02/13 with multifactorial degenerative cervical stenosis and arthropathy with moderate spinal cord mass effect of C6-7 who presents with uncontrolled pain on current pain regimen with no alarm symptoms.   Plan:  -Due to extensive cervical and spinal pathology with spinal cord inflammation/mass effect and uncontrolled pain and inability to perform ADL's, I her PCP for the past 3 years believe she is a candidate for chronic narcotic therapy either from our clinic or pain clinic which she is to see. Her last UDS on 02/07/15 was appropriate for narcotics prescribed.  -Prescribe percocet 10-325 mg TID PRN pain #90, pt given 3 prescriptions to last until end of August until can be seen by [facility] pain clinic for further assessment. Pt stated she would not obtain narcotics from any other provider.  -Continue gabapentin 600 mg TID, voltaren 1% gel QID PRN pain, and flexeril 5 mg TID PRN muscle spasms -Pt to follow-up with [facility] neurosurgery, may need repeat MRI cervical spine and possible surgery

## 2015-08-31 NOTE — Assessment & Plan Note (Signed)
-  Pt reports she cannot have screening mammogram at this time until has clearance from her surgeon   -Obtain A1c at next visit in setting of prediabetes (5.7 on 12/09/12)

## 2015-09-03 NOTE — Progress Notes (Signed)
Internal Medicine Clinic Attending  Case discussed with Dr. Rabbani soon after the resident saw the patient.  We reviewed the resident's history and exam and pertinent patient test results.  I agree with the assessment, diagnosis, and plan of care documented in the resident's note.  

## 2015-09-04 ENCOUNTER — Other Ambulatory Visit: Payer: Self-pay | Admitting: Internal Medicine

## 2015-09-04 DIAGNOSIS — E785 Hyperlipidemia, unspecified: Secondary | ICD-10-CM

## 2015-09-04 DIAGNOSIS — F419 Anxiety disorder, unspecified: Secondary | ICD-10-CM

## 2015-09-04 DIAGNOSIS — M47819 Spondylosis without myelopathy or radiculopathy, site unspecified: Secondary | ICD-10-CM

## 2015-09-04 DIAGNOSIS — F32A Depression, unspecified: Secondary | ICD-10-CM

## 2015-09-04 DIAGNOSIS — K588 Other irritable bowel syndrome: Secondary | ICD-10-CM

## 2015-09-04 DIAGNOSIS — F329 Major depressive disorder, single episode, unspecified: Secondary | ICD-10-CM

## 2015-09-04 DIAGNOSIS — G894 Chronic pain syndrome: Secondary | ICD-10-CM

## 2015-09-04 DIAGNOSIS — I10 Essential (primary) hypertension: Secondary | ICD-10-CM

## 2015-09-04 DIAGNOSIS — F319 Bipolar disorder, unspecified: Secondary | ICD-10-CM

## 2015-09-04 MED ORDER — LISINOPRIL-HYDROCHLOROTHIAZIDE 20-12.5 MG PO TABS: 2.0000 | ORAL_TABLET | Freq: Every day | ORAL | Status: DC

## 2015-09-04 MED ORDER — DICLOFENAC SODIUM 1 % TD GEL: 1.0000 "application " | Freq: Four times a day (QID) | TRANSDERMAL | Status: DC | PRN

## 2015-09-04 MED ORDER — AMLODIPINE BESYLATE 10 MG PO TABS: 10.0000 mg | ORAL_TABLET | Freq: Every day | ORAL | Status: DC

## 2015-09-04 MED ORDER — CYCLOBENZAPRINE HCL 5 MG PO TABS
5.0000 mg | ORAL_TABLET | Freq: Three times a day (TID) | ORAL | Status: DC | PRN
Start: 2015-09-04 — End: 2016-03-11

## 2015-09-04 MED ORDER — SIMVASTATIN 20 MG PO TABS: 20.0000 mg | ORAL_TABLET | Freq: Every evening | ORAL | Status: DC

## 2015-09-04 MED ORDER — SERTRALINE HCL 100 MG PO TABS: 100.0000 mg | ORAL_TABLET | Freq: Every day | ORAL | Status: DC

## 2015-09-04 MED ORDER — ASPIRIN EC 81 MG PO TBEC: 81.0000 mg | DELAYED_RELEASE_TABLET | Freq: Every day | ORAL | Status: DC

## 2015-09-04 MED ORDER — FAMOTIDINE 20 MG PO TABS: 20.0000 mg | ORAL_TABLET | Freq: Two times a day (BID) | ORAL | Status: DC

## 2015-09-04 MED ORDER — GABAPENTIN 300 MG PO CAPS: 600.0000 mg | ORAL_CAPSULE | Freq: Three times a day (TID) | ORAL | Status: DC

## 2015-09-04 MED ORDER — METOPROLOL TARTRATE 25 MG PO TABS: 25.0000 mg | ORAL_TABLET | Freq: Two times a day (BID) | ORAL | Status: DC

## 2015-09-04 MED ORDER — MIRTAZAPINE 15 MG PO TABS: 15.0000 mg | ORAL_TABLET | Freq: Every day | ORAL | Status: DC

## 2015-09-04 MED ORDER — DIVALPROEX SODIUM 250 MG PO DR TAB: 500.0000 mg | DELAYED_RELEASE_TABLET | Freq: Two times a day (BID) | ORAL | Status: DC

## 2015-09-04 MED ORDER — DICYCLOMINE HCL 20 MG PO TABS: 20.0000 mg | ORAL_TABLET | Freq: Three times a day (TID) | ORAL | Status: DC

## 2015-09-10 NOTE — Progress Notes (Signed)
i have tried to call [facility] to confirm pt is being assisted and cannot get an answer when calling

## 2015-09-12 NOTE — Progress Notes (Signed)
Lm for pt to call [facility] pharm

## 2015-09-12 NOTE — Progress Notes (Signed)
Pt has not picked up her meds yet and they cannot reach her by ph, triage will try calling her

## 2015-09-16 ENCOUNTER — Other Ambulatory Visit: Payer: Self-pay | Admitting: Medical Oncology

## 2015-09-23 ENCOUNTER — Other Ambulatory Visit (HOSPITAL_BASED_OUTPATIENT_CLINIC_OR_DEPARTMENT_OTHER): Payer: No Typology Code available for payment source

## 2015-09-23 ENCOUNTER — Ambulatory Visit (HOSPITAL_COMMUNITY)
Admission: RE | Admit: 2015-09-23 | Discharge: 2015-09-23 | Disposition: A | Discharge status: Home / Self Care | Payer: No Typology Code available for payment source | Source: Ambulatory Visit | Attending: Internal Medicine | Admitting: Internal Medicine

## 2015-09-23 DIAGNOSIS — R918 Other nonspecific abnormal finding of lung field: Secondary | ICD-10-CM | POA: Insufficient documentation

## 2015-09-23 DIAGNOSIS — J432 Centrilobular emphysema: Secondary | ICD-10-CM | POA: Insufficient documentation

## 2015-09-23 DIAGNOSIS — Z87898 Personal history of other specified conditions: Secondary | ICD-10-CM

## 2015-09-23 DIAGNOSIS — C3432 Malignant neoplasm of lower lobe, left bronchus or lung: Secondary | ICD-10-CM | POA: Insufficient documentation

## 2015-09-23 DIAGNOSIS — I10 Essential (primary) hypertension: Secondary | ICD-10-CM

## 2015-09-23 DIAGNOSIS — I2510 Atherosclerotic heart disease of native coronary artery without angina pectoris: Secondary | ICD-10-CM | POA: Insufficient documentation

## 2015-09-23 LAB — CBC WITH DIFFERENTIAL/PLATELET
BASO%: 0.7 % (ref 0.0–2.0)
Basophils Absolute: 0.1 10*3/uL (ref 0.0–0.1)
EOS%: 1.7 % (ref 0.0–7.0)
Eosinophils Absolute: 0.2 10*3/uL (ref 0.0–0.5)
HEMATOCRIT: 43.4 % (ref 34.8–46.6)
HGB: 14.4 g/dL (ref 11.6–15.9)
LYMPH#: 2.6 10*3/uL (ref 0.9–3.3)
LYMPH%: 29.3 % (ref 14.0–49.7)
MCH: 32.8 pg (ref 25.1–34.0)
MCHC: 33.2 g/dL (ref 31.5–36.0)
MCV: 98.8 fL (ref 79.5–101.0)
MONO#: 0.5 10*3/uL (ref 0.1–0.9)
MONO%: 5.3 % (ref 0.0–14.0)
NEUT#: 5.5 10*3/uL (ref 1.5–6.5)
NEUT%: 63 % (ref 38.4–76.8)
PLATELETS: 271 10*3/uL (ref 145–400)
RBC: 4.39 10*6/uL (ref 3.70–5.45)
RDW: 14.3 % (ref 11.2–14.5)
WBC: 8.8 10*3/uL (ref 3.9–10.3)

## 2015-09-23 LAB — COMPREHENSIVE METABOLIC PANEL
ALT: 10 U/L (ref 0–55)
ANION GAP: 9 meq/L (ref 3–11)
AST: 15 U/L (ref 5–34)
Albumin: 4.3 g/dL (ref 3.5–5.0)
Alkaline Phosphatase: 87 U/L (ref 40–150)
BILIRUBIN TOTAL: 0.38 mg/dL (ref 0.20–1.20)
BUN: 10.7 mg/dL (ref 7.0–26.0)
CALCIUM: 9.9 mg/dL (ref 8.4–10.4)
CO2: 23 meq/L (ref 22–29)
CREATININE: 0.8 mg/dL (ref 0.6–1.1)
Chloride: 107 mEq/L (ref 98–109)
EGFR: 87 mL/min/{1.73_m2} — ABNORMAL LOW (ref >90)
Glucose: 116 mg/dl (ref 70–140)
Potassium: 4.1 mEq/L (ref 3.5–5.1)
Sodium: 139 mEq/L (ref 136–145)
TOTAL PROTEIN: 8.4 g/dL — AB (ref 6.4–8.3)

## 2015-09-30 ENCOUNTER — Telehealth: Payer: Self-pay | Admitting: Internal Medicine

## 2015-09-30 ENCOUNTER — Encounter: Payer: Self-pay | Admitting: Internal Medicine

## 2015-09-30 ENCOUNTER — Ambulatory Visit (HOSPITAL_BASED_OUTPATIENT_CLINIC_OR_DEPARTMENT_OTHER): Payer: No Typology Code available for payment source | Admitting: Internal Medicine

## 2015-09-30 VITALS — BP 160/75 | HR 84 | Temp 98.2°F | Resp 18 | Ht 63.0 in | Wt 125.9 lb

## 2015-09-30 DIAGNOSIS — R87810 Cervical high risk human papillomavirus (HPV) DNA test positive: Secondary | ICD-10-CM | POA: Insufficient documentation

## 2015-09-30 DIAGNOSIS — I10 Essential (primary) hypertension: Secondary | ICD-10-CM

## 2015-09-30 DIAGNOSIS — C3432 Malignant neoplasm of lower lobe, left bronchus or lung: Secondary | ICD-10-CM

## 2015-09-30 DIAGNOSIS — Z85118 Personal history of other malignant neoplasm of bronchus and lung: Secondary | ICD-10-CM
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
## 2015-09-30 NOTE — Telephone Encounter (Signed)
lvm for pt regarding to [facility] appt

## 2015-10-02 ENCOUNTER — Encounter: Payer: Self-pay | Admitting: *Deleted

## 2015-11-22 ENCOUNTER — Ambulatory Visit (INDEPENDENT_AMBULATORY_CARE_PROVIDER_SITE_OTHER): Payer: Self-pay | Admitting: Internal Medicine

## 2015-11-22 ENCOUNTER — Encounter: Payer: Self-pay | Admitting: Internal Medicine

## 2015-11-22 ENCOUNTER — Ambulatory Visit (HOSPITAL_COMMUNITY)
Admission: RE | Admit: 2015-11-22 | Discharge: 2015-11-22 | Disposition: A | Discharge status: Home / Self Care | Payer: Self-pay | Source: Ambulatory Visit | Attending: Internal Medicine | Admitting: Internal Medicine

## 2015-11-22 VITALS — BP 186/99 | HR 72 | Temp 97.5°F | Wt 126.2 lb

## 2015-11-22 DIAGNOSIS — I10 Essential (primary) hypertension: Secondary | ICD-10-CM

## 2015-11-22 DIAGNOSIS — Z79891 Long term (current) use of opiate analgesic: Secondary | ICD-10-CM

## 2015-11-22 DIAGNOSIS — G894 Chronic pain syndrome: Secondary | ICD-10-CM

## 2015-11-22 DIAGNOSIS — Z79899 Other long term (current) drug therapy: Secondary | ICD-10-CM

## 2015-11-22 DIAGNOSIS — M79651 Pain in right thigh: Secondary | ICD-10-CM

## 2015-11-22 DIAGNOSIS — M79604 Pain in right leg: Secondary | ICD-10-CM | POA: Insufficient documentation

## 2015-11-22 DIAGNOSIS — Z85118 Personal history of other malignant neoplasm of bronchus and lung: Secondary | ICD-10-CM

## 2015-11-22 MED ORDER — OXYCODONE-ACETAMINOPHEN 10-325 MG PO TABS: 1.0000 | ORAL_TABLET | Freq: Three times a day (TID) | ORAL | 0 refills | Status: DC | PRN

## 2015-11-22 MED ORDER — KETOROLAC TROMETHAMINE 30 MG/ML IJ SOLN
30.0000 mg | Freq: Once | INTRAMUSCULAR | Status: AC
  Administered 2015-11-22: 30 mg via INTRAMUSCULAR

## 2015-11-22 NOTE — Patient Instructions (Addendum)
Brenda [name]  Order has been placed for Ultra Sound of right leg Please follow up in one week

## 2015-11-22 NOTE — Progress Notes (Signed)
*  PRELIMINARY RESULTS* Vascular Ultrasound Right lower extremity venous duplex has been completed.  Preliminary findings: No evidence of DVT or baker's cyst.   Called results to Dr. Heber [place].    Landry Mellow, RDMS, RVT  11/22/2015, 4:25 PM
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
## 2015-11-24 DIAGNOSIS — M79604 Pain in right leg: Secondary | ICD-10-CM | POA: Insufficient documentation

## 2015-11-24 NOTE — Assessment & Plan Note (Addendum)
Assessment:  Essential hypertension Initial in office blood pressure was 212/74.  Repeat blood pressure was 200/96.  Patient states that her blood pressure is usually elevated because she is in pain.  '30mg'$  Toradol injection was given and bp rechecked showed 186/99.  Patient has a history of uncontrolled HTN despite amlodipine '10mg'$ , lisinopril-hydrochlorothiazide 20-12.5 2 tab daily and metoprolol '25mg'$  BID  Plan - Re-check blood pressure on next visit - may need adjustments to BP med

## 2015-11-24 NOTE — Assessment & Plan Note (Signed)
Assessment:  Right lower extremity pain With history of non-small cell lung cancer, adenocarcinoma patient is at higher risk for DVTs. Recent CT in June 2017 showed no evidence for disease recurrence.   With worsening pain localized to anterior mid thigh workup for DVT was considered  Plan - Right Lower Extremity Venous Duplex Evaluation  Update:  No evidence of DVT in right lower extremity

## 2015-11-24 NOTE — Assessment & Plan Note (Signed)
Assessment:  Chronic Pain Syndrome Patient's appointment with Primary Care Physician is for September 13th.  She stated she needed refills on her oxycodone-acetaminophen 10-'325mg'$ .  Plan - 2 month prescription for oxycodone-acetaminophen 10-'325mg'$  #90

## 2015-11-25 NOTE — Progress Notes (Signed)
Internal Medicine Clinic Attending  I saw and evaluated the patient.  I personally confirmed the key portions of the history and exam documented by Dr. Hoffman and I reviewed pertinent patient test results.  The assessment, diagnosis, and plan were formulated together and I agree with the documentation in the resident's note.      

## 2015-12-24 ENCOUNTER — Telehealth: Payer: Self-pay | Admitting: Internal Medicine

## 2015-12-24 NOTE — Telephone Encounter (Signed)
AT. REMINDER CALL, NO ANSWER, NO VOICEMAIL

## 2015-12-25 ENCOUNTER — Ambulatory Visit (INDEPENDENT_AMBULATORY_CARE_PROVIDER_SITE_OTHER): Payer: Self-pay | Admitting: Internal Medicine

## 2015-12-25 ENCOUNTER — Encounter: Payer: Self-pay | Admitting: Internal Medicine

## 2015-12-25 VITALS — BP 182/77 | HR 71 | Temp 98.1°F | Ht 63.0 in | Wt 128.4 lb

## 2015-12-25 DIAGNOSIS — F418 Other specified anxiety disorders: Secondary | ICD-10-CM

## 2015-12-25 DIAGNOSIS — F419 Anxiety disorder, unspecified: Secondary | ICD-10-CM

## 2015-12-25 DIAGNOSIS — M5416 Radiculopathy, lumbar region: Secondary | ICD-10-CM

## 2015-12-25 DIAGNOSIS — M4712 Other spondylosis with myelopathy, cervical region: Secondary | ICD-10-CM | POA: Insufficient documentation

## 2015-12-25 DIAGNOSIS — F319 Bipolar disorder, unspecified: Secondary | ICD-10-CM

## 2015-12-25 DIAGNOSIS — I10 Essential (primary) hypertension: Secondary | ICD-10-CM

## 2015-12-25 DIAGNOSIS — G894 Chronic pain syndrome: Secondary | ICD-10-CM

## 2015-12-25 DIAGNOSIS — E785 Hyperlipidemia, unspecified: Secondary | ICD-10-CM

## 2015-12-25 DIAGNOSIS — K580 Irritable bowel syndrome with diarrhea: Secondary | ICD-10-CM

## 2015-12-25 DIAGNOSIS — F329 Major depressive disorder, single episode, unspecified: Secondary | ICD-10-CM

## 2015-12-25 DIAGNOSIS — F17210 Nicotine dependence, cigarettes, uncomplicated: Secondary | ICD-10-CM

## 2015-12-25 DIAGNOSIS — Z0000 Encounter for general adult medical examination without abnormal findings: Secondary | ICD-10-CM

## 2015-12-25 DIAGNOSIS — K588 Other irritable bowel syndrome: Secondary | ICD-10-CM

## 2015-12-25 LAB — POCT GLYCOSYLATED HEMOGLOBIN (HGB A1C): HEMOGLOBIN A1C: 5.8

## 2015-12-25 LAB — GLUCOSE, CAPILLARY: GLUCOSE-CAPILLARY: 108 mg/dL — AB (ref 65–99)

## 2015-12-25 MED ORDER — SIMVASTATIN 20 MG PO TABS: 20.0000 mg | ORAL_TABLET | Freq: Every evening | ORAL | 3 refills | Status: DC

## 2015-12-25 MED ORDER — AMLODIPINE BESYLATE 10 MG PO TABS: 10.0000 mg | ORAL_TABLET | Freq: Every day | ORAL | 3 refills | Status: DC

## 2015-12-25 MED ORDER — SPIRONOLACTONE 25 MG PO TABS: 25.0000 mg | ORAL_TABLET | Freq: Every day | ORAL | 2 refills | Status: DC

## 2015-12-25 MED ORDER — DICYCLOMINE HCL 20 MG PO TABS: 20.0000 mg | ORAL_TABLET | Freq: Three times a day (TID) | ORAL | 3 refills | Status: DC

## 2015-12-25 MED ORDER — OXYCODONE-ACETAMINOPHEN 10-325 MG PO TABS: 1.0000 | ORAL_TABLET | Freq: Three times a day (TID) | ORAL | 0 refills | Status: DC | PRN

## 2015-12-25 MED ORDER — METOPROLOL TARTRATE 25 MG PO TABS: 25.0000 mg | ORAL_TABLET | Freq: Two times a day (BID) | ORAL | 3 refills | Status: DC

## 2015-12-25 MED ORDER — DIVALPROEX SODIUM 250 MG PO DR TAB
500.0000 mg | DELAYED_RELEASE_TABLET | Freq: Two times a day (BID) | ORAL | 3 refills | Status: DC
Start: 2015-12-25 — End: 2016-03-11

## 2015-12-25 MED ORDER — LISINOPRIL-HYDROCHLOROTHIAZIDE 20-12.5 MG PO TABS: 2.0000 | ORAL_TABLET | Freq: Every day | ORAL | 3 refills | Status: DC

## 2015-12-25 MED ORDER — OXYCODONE-ACETAMINOPHEN 10-325 MG PO TABS: 1.0000 | ORAL_TABLET | Freq: Three times a day (TID) | ORAL | 0 refills | Status: AC | PRN

## 2015-12-25 MED ORDER — MIRTAZAPINE 15 MG PO TABS: 15.0000 mg | ORAL_TABLET | Freq: Every day | ORAL | 2 refills | Status: DC

## 2015-12-25 MED ORDER — GABAPENTIN 300 MG PO CAPS: 600.0000 mg | ORAL_CAPSULE | Freq: Three times a day (TID) | ORAL | 3 refills | Status: DC

## 2015-12-25 MED ORDER — SERTRALINE HCL 100 MG PO TABS: 100.0000 mg | ORAL_TABLET | Freq: Every day | ORAL | 3 refills | Status: DC
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
## 2015-12-25 NOTE — Patient Instructions (Addendum)
For your blood pressure, please take the following: -Amlodipine '10mg'$  daily -lisinopril-HCTZ 20-12.'5mg'$ ; take 2 pills daily -Metoprolol '25mg'$  daily -NEW MED: spironolactone '25mg'$  daily  We will see you back in clinic to check your blood pressure in about 2 weeks and check blood work  I will see you in about 6 weeks for a regular checkup  I have sent a referral to [facility], and placed an order for an MRI that you can get at your convenience  It was very nice meeting you! Take care!    Back Exercises The following exercises strengthen the muscles that help to support the back. They also help to keep the lower back flexible. Doing these exercises can help to prevent back pain or lessen existing pain. If you have back pain or discomfort, try doing these exercises 2-3 times each day or as told by your health care provider. When the pain goes away, do them once each day, but increase the number of times that you repeat the steps for each exercise (do more repetitions). If you do not have back pain or discomfort, do these exercises once each day or as told by your health care provider. EXERCISES Single Knee to Chest Repeat these steps 3-5 times for each leg: 1. Lie on your back on a firm bed or the floor with your legs extended. 2. Bring one knee to your chest. Your other leg should stay extended and in contact with the floor. 3. Hold your knee in place by grabbing your knee or thigh. 4. Pull on your knee until you feel a gentle stretch in your lower back. 5. Hold the stretch for 10-30 seconds. 6. Slowly release and straighten your leg. Pelvic Tilt Repeat these steps 5-10 times: 1. Lie on your back on a firm bed or the floor with your legs extended. 2. Bend your knees so they are pointing toward the ceiling and your feet are flat on the floor. 3. Tighten your lower abdominal muscles to press your lower back against the floor. This motion will tilt your pelvis so your tailbone points up  toward the ceiling instead of pointing to your feet or the floor. 4. With gentle tension and even breathing, hold this position for 5-10 seconds. Cat-Cow Repeat these steps until your lower back becomes more flexible: 1. Get into a hands-and-knees position on a firm surface. Keep your hands under your shoulders, and keep your knees under your hips. You may place padding under your knees for comfort. 2. Let your head hang down, and point your tailbone toward the floor so your lower back becomes rounded like the back of a cat. 3. Hold this position for 5 seconds. 4. Slowly lift your head and point your tailbone up toward the ceiling so your back forms a sagging arch like the back of a cow. 5. Hold this position for 5 seconds. Press-Ups Repeat these steps 5-10 times: 1. Lie on your abdomen (face-down) on the floor. 2. Place your palms near your head, about shoulder-width apart. 3. While you keep your back as relaxed as possible and keep your hips on the floor, slowly straighten your arms to raise the top half of your body and lift your shoulders. Do not use your back muscles to raise your upper torso. You may adjust the placement of your hands to make yourself more comfortable. 4. Hold this position for 5 seconds while you keep your back relaxed. 5. Slowly return to lying flat on the floor. Bridges Repeat these  steps 10 times: 1. Lie on your back on a firm surface. 2. Bend your knees so they are pointing toward the ceiling and your feet are flat on the floor. 3. Tighten your buttocks muscles and lift your buttocks off of the floor until your waist is at almost the same height as your knees. You should feel the muscles working in your buttocks and the back of your thighs. If you do not feel these muscles, slide your feet 1-2 inches farther away from your buttocks. 4. Hold this position for 3-5 seconds. 5. Slowly lower your hips to the starting position, and allow your buttocks muscles to relax  completely. If this exercise is too easy, try doing it with your arms crossed over your chest. Abdominal Crunches Repeat these steps 5-10 times: 1. Lie on your back on a firm bed or the floor with your legs extended. 2. Bend your knees so they are pointing toward the ceiling and your feet are flat on the floor. 3. Cross your arms over your chest. 4. Tip your chin slightly toward your chest without bending your neck. 5. Tighten your abdominal muscles and slowly raise your trunk (torso) high enough to lift your shoulder blades a tiny bit off of the floor. Avoid raising your torso higher than that, because it can put too much stress on your low back and it does not help to strengthen your abdominal muscles. 6. Slowly return to your starting position. Back Lifts Repeat these steps 5-10 times: 1. Lie on your abdomen (face-down) with your arms at your sides, and rest your forehead on the floor. 2. Tighten the muscles in your legs and your buttocks. 3. Slowly lift your chest off of the floor while you keep your hips pressed to the floor. Keep the back of your head in line with the curve in your back. Your eyes should be looking at the floor. 4. Hold this position for 3-5 seconds. 5. Slowly return to your starting position. SEEK MEDICAL CARE IF:  Your back pain or discomfort gets much worse when you do an exercise.  Your back pain or discomfort does not lessen within 2 hours after you exercise. If you have any of these problems, stop doing these exercises right away. Do not do them again unless your health care provider says that you can. SEEK IMMEDIATE MEDICAL CARE IF:  You develop sudden, severe back pain. If this happens, stop doing the exercises right away. Do not do them again unless your health care provider says that you can.   This information is not intended to replace advice given to you by your health care provider. Make sure you discuss any questions you have with your health care  provider.   Document Released: 05/07/2004 Document Revised: 12/19/2014 Document Reviewed: 05/24/2014 Elsevier Interactive Patient Education [employer].

## 2015-12-26 NOTE — Assessment & Plan Note (Signed)
Patient with history of lumbar radiculopathy with sciatica and newer onset of left anterior thigh pain that was r/o for DVT. The tight pain began about 2 months ago, is constant and dull in nature, without radiation or sensory changes. MRI in 04/2014 showed multilevel facet disease, foraminal stenosis, mild disc bulging, and possible nerve encroachment. Patient was referred to [place] for evaluation for possible surgical approach. She was referred by them for physiotherapy, spine PT/OT therapy, pain clinic and conservative treatment for now. She has been unable to afford those referrals and is working of obtaining discounts through [employer] for indigent patients. She denies urinary or bowel incontinence, sensory changes, or other alarm symptoms. She has had some falls this summer where she feels her knee would just give out, but denies fall in the last month or injury. She uses a cane for support and will at times use a walker. She does not drive and relies on public transportation and her friend for transportation to medical appointments and grocery shopping.   Plan: --refill Percocet 10-'325mg'$  q8hrs PRN; signed pain contract --refill voltaren gel QID PRN --advised on using heat and maintaining activity as tolerated  --provided instructions on exercises she can do at home since she is not able to go to PT at this time --set up appointment with our financial counselor for assistance in obtaining indigent discounts for medical care, pain clinic, and PT --referred back to [facility] with Dr. Brigitte Pulse with orthopedics

## 2015-12-26 NOTE — Assessment & Plan Note (Signed)
Patient with history of depression and anxiety in setting of chronic pain that is stable on medical management with zoloft '100mg'$  daily.   Plan: --Refill Zoloft '100mg'$  daily --continue discussion on adding counseling and mental health services

## 2015-12-26 NOTE — Assessment & Plan Note (Signed)
Patient with Bipolar disorder compliant with depakote therapy.  Plan: --Refill depakote '500mg'$  BID --continue encouraging patient to f/u with mental health services

## 2015-12-26 NOTE — Assessment & Plan Note (Signed)
Patient wants to delay mammogram until she has [facility] ortho f/u and indigent discount obtained.   She got her flu shot today.  A1c was rechecked today, at 5.8; will discuss with pt at 6 wk f/u visit.

## 2015-12-26 NOTE — Assessment & Plan Note (Signed)
Patient has history of uncontrolled hypertension on medical management with lisinopril-HCTZ '40mg'$ -'25mg'$  daily, amlodipine '10mg'$  daily, and metoprolol '25mg'$  BID. She states she is compliant with taking her medications as indicated. She measures her BP at home with neighbor's BP cuff and states that the reading is usually in 161'W-960'A systolic. She occasionally measures it at [facility] and [facility] and states that she "always sets the machine off" but can't say what reading it is. She states that she notices her BP gets higher when her pain is uncontrolled. At these times she feels her heart thumping in her head, but denies other headaches, vision changes, chest pain, shortness of breath or palpitations. Currently she feels the pain is well controlled, rating it as 6/10 and she feels comfortable. Her initial BP on arrival was 182/77 with repeat measurement of 170/75.   Plan: --continue amlodipine '10mg'$  daily, lisinopril-HCTZ 40-'25mg'$  daily, and metoprolol '25mg'$  BID --Add spironolactone '25mg'$  daily --return to clinic in 2 weeks for BP check and BMET to check for hyperkalemia

## 2015-12-26 NOTE — Assessment & Plan Note (Signed)
Patient has history of diarrhea-predominant IBS on medical management with Bentyl. She denies melena, blood in stool, or BRBPR. Her last colonoscopy was in 2013 which was normal.   Plan:  --refill bentyl '20mg'$  TID before meals and at bedtime --continue monitoring

## 2015-12-26 NOTE — Assessment & Plan Note (Addendum)
Patient with CAD on simvastatin therapy without complaints of myalgias. Last LDL in 2014 was 78. Last CMP was 09/2015 with normal liver function. A1c rechecked this visit and is 5.8 (from 5.7 previously).   Plan: --refill simvastatin '20mg'$  daily --continue monitoring for myalgias and liver function. --address A1c at f/u appt in 6 weeks to discuss lifestyle vs medical management

## 2015-12-26 NOTE — Assessment & Plan Note (Signed)
Patient with history of lumbar radiculopathy with sciatica and newer onset of left anterior thigh pain that was r/o for DVT as well as cervial spondylosis with myopathy and radiculopathy. Lumbar MRI in 04/2014 showed multilevel facet disease, foraminal stenosis, mild disc bulging, and possible nerve encroachment. Cervical MRI in 10/2013 showed multifactorial degenerative cervical spinal stenosis, cervical facet arthropathy and spinal cord mass effect. in Patient was referred to [place] for evaluation for possible surgical approach. She was referred by them for physiotherapy, spine PT/OT therapy, pain clinic and conservative treatment for now. She has been unable to afford those referrals and is working of obtaining discounts through [employer] for indigent patients. She denies urinary or bowel incontinence, sensory changes, or other alarm symptoms. She has had some falls this summer where she feels her knee would just give out, but denies fall in the last month or injury. She uses a cane for support and will at times use a walker. She does not drive and relies on public transportation and her friend for transportation to medical appointments and grocery shopping.   Plan: --refill Percocet 10-'325mg'$  q8hrs PRN; signed pain contract --refill voltaren gel QID PRN --advised on using heat and maintaining activity as tolerated  --provided instructions on exercises she can do at home since she is not able to go to PT at this time --set up appointment with our financial counselor for assistance in obtaining indigent discounts for medical care, pain clinic, and PT --referred back to [facility] with Dr. Brigitte Pulse with orthopedics

## 2016-01-01 NOTE — Progress Notes (Signed)
Internal Medicine Clinic Attending  I saw and evaluated the patient.  I personally confirmed the key portions of the history and exam documented by Dr. Svalina and I reviewed pertinent patient test results.  The assessment, diagnosis, and plan were formulated together and I agree with the documentation in the resident's note.  

## 2016-01-02 ENCOUNTER — Ambulatory Visit (HOSPITAL_COMMUNITY)
Admission: RE | Admit: 2016-01-02 | Discharge: 2016-01-02 | Disposition: A | Discharge status: Home / Self Care | Payer: Self-pay | Source: Ambulatory Visit | Attending: Internal Medicine | Admitting: Internal Medicine

## 2016-01-02 DIAGNOSIS — M5416 Radiculopathy, lumbar region: Secondary | ICD-10-CM | POA: Insufficient documentation

## 2016-01-02 DIAGNOSIS — M4806 Spinal stenosis, lumbar region: Secondary | ICD-10-CM | POA: Insufficient documentation

## 2016-01-02 DIAGNOSIS — M4686 Other specified inflammatory spondylopathies, lumbar region: Secondary | ICD-10-CM | POA: Insufficient documentation

## 2016-01-04 IMAGING — RF DG VCUG
15 of 19 series · 15 of 19 positions shown · non-contrast
Comparison: 02/26/13

CLINICAL DATA: recurrent urinary tract infections

EXAM:
VOIDING CYSTOURETHROGRAM
TECHNIQUE: After catheterization of the urinary bladder following sterile
technique by nursing personnel, the bladder was filled with 375 ml
Cysto-hypaque 30% by drip infusion. Serial spot images were obtained
during bladder filling and voiding.
FLUOROSCOPY TIME:  57 seconds

[Series 1: run · 1 of 1 slices shown (1 of 15)]
[im 1/1]
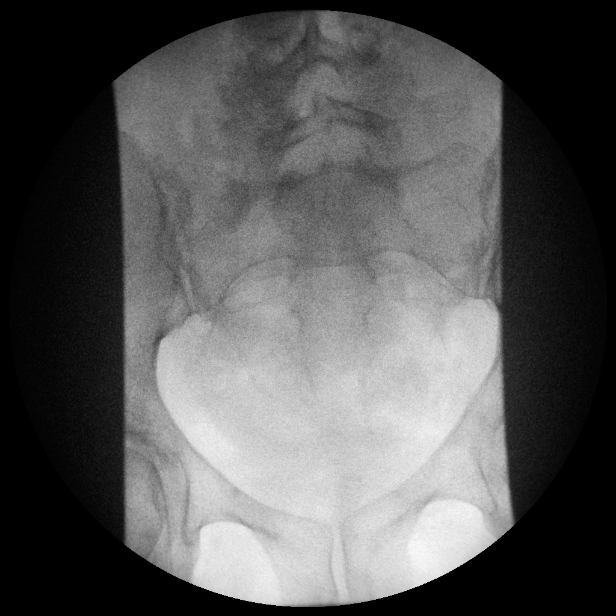

[Series 2: run · 1 of 1 slices shown (2 of 15)]
[im 1/1]
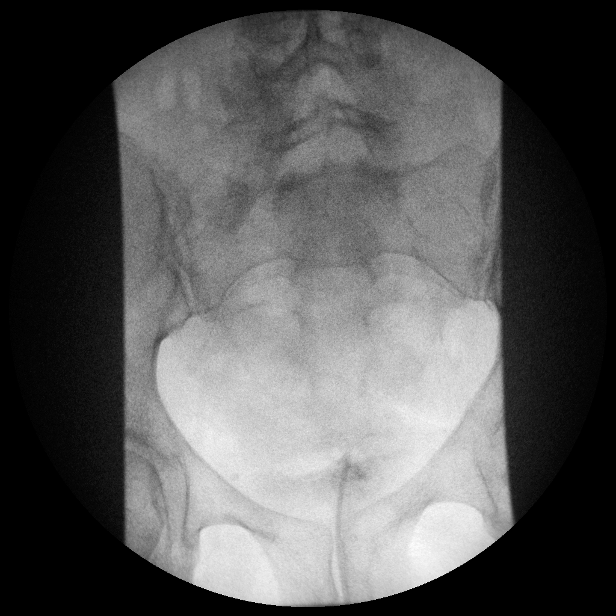

[Series 4: run · 1 of 1 slices shown (3 of 15)]
[im 1/1]
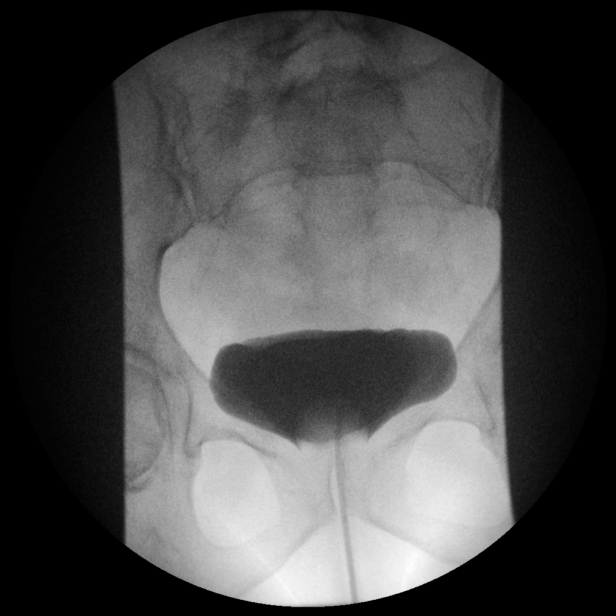

[Series 5: run · 1 of 1 slices shown (4 of 15)]
[im 1/1]
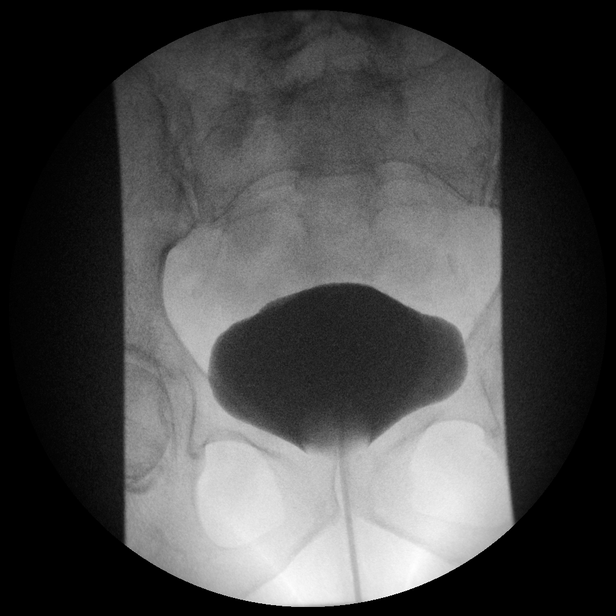

[Series 6: run · 1 of 1 slices shown (5 of 15)]
[im 1/1]
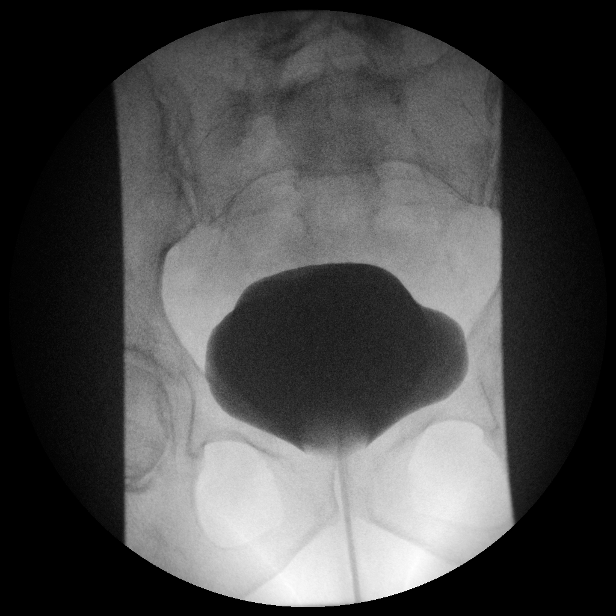

[Series 7: run · 1 of 1 slices shown (6 of 15)]
[im 1/1]
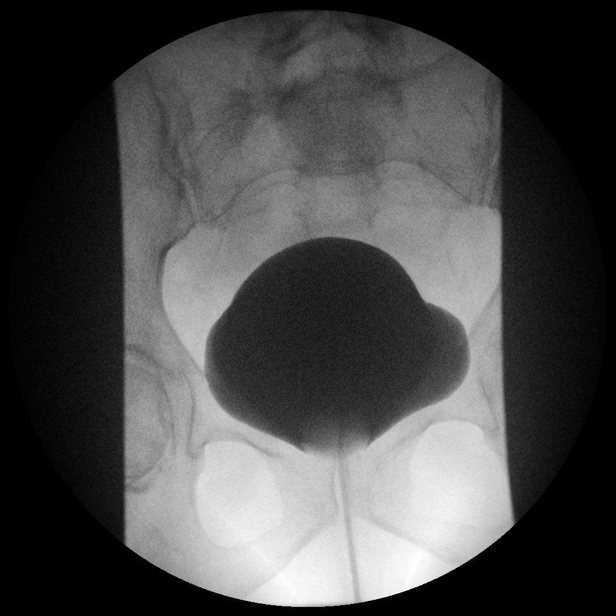

[Series 9: run · 1 of 1 slices shown (7 of 15)]
[im 1/1]
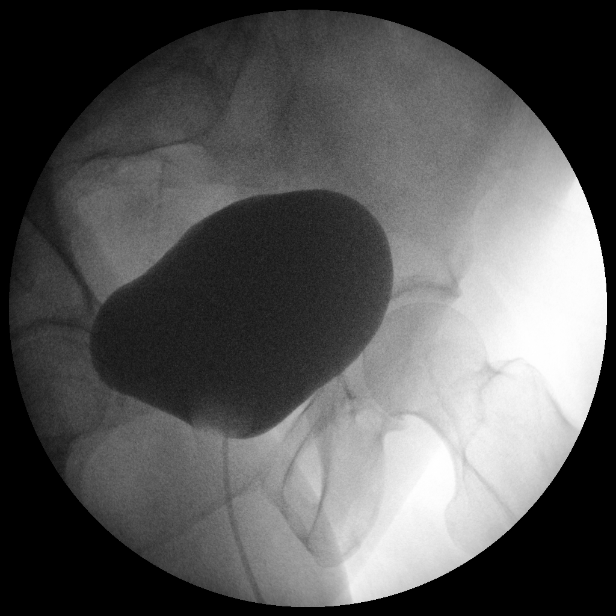

[Series 10: run · 1 of 1 slices shown (8 of 15)]
[im 1/1]
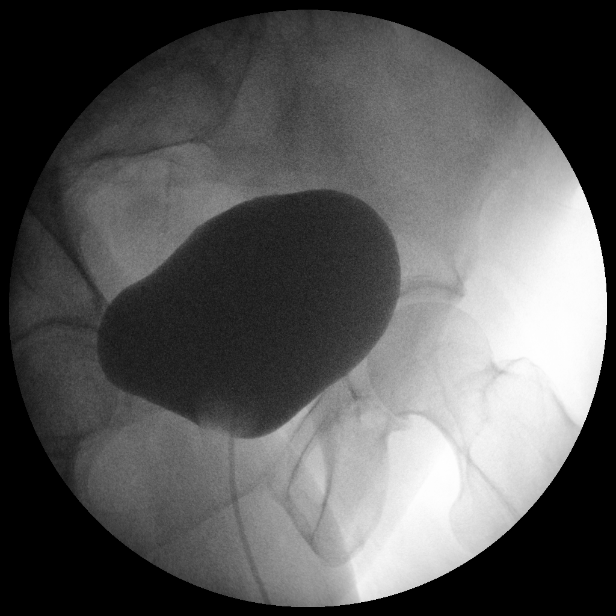

[Series 11: run · 1 of 1 slices shown (9 of 15)]
[im 1/1]
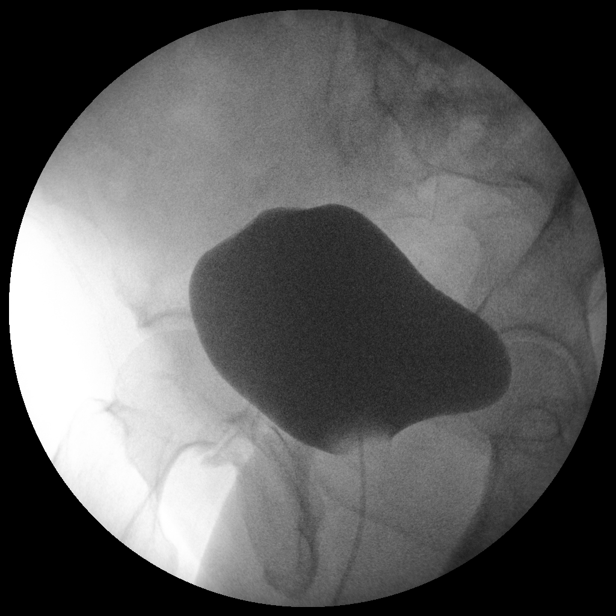

[Series 13: run · 1 of 1 slices shown (10 of 15)]
[im 1/1]
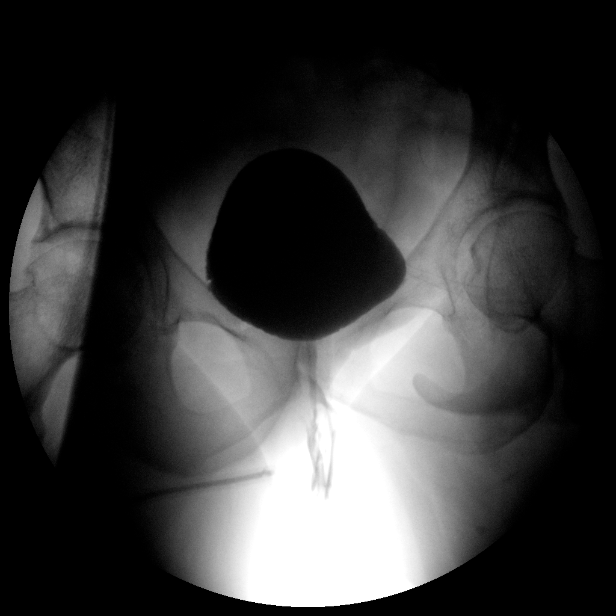

[Series 14: run · 1 of 1 slices shown (11 of 15)]
[im 1/1]
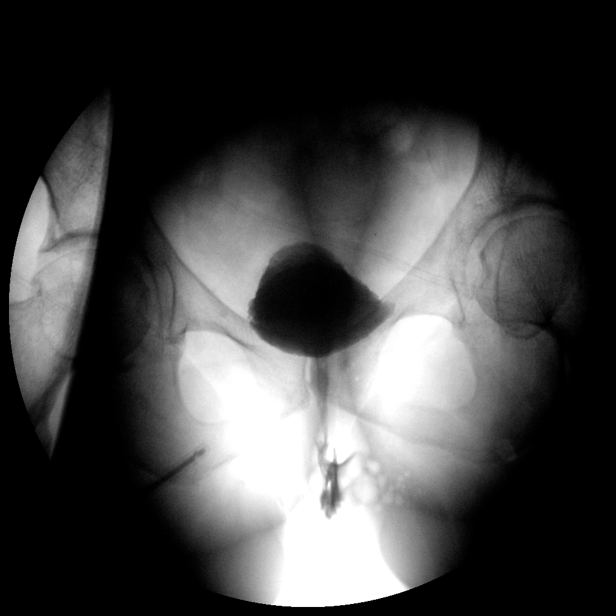

[Series 15: run · 1 of 1 slices shown (12 of 15)]
[im 1/1]
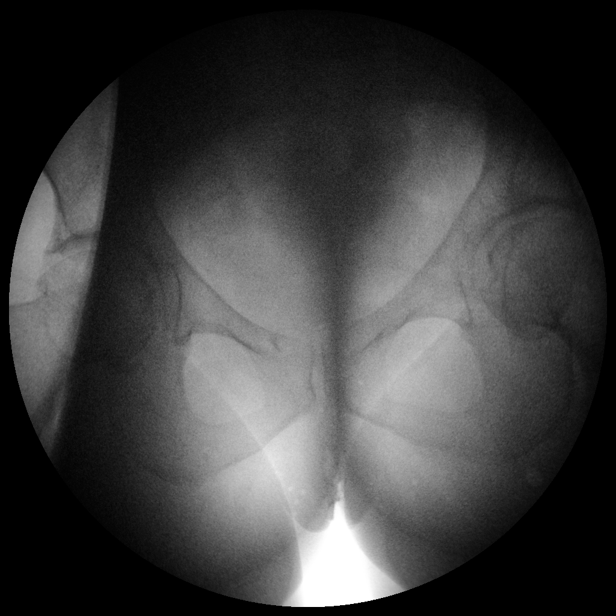

[Series 16: run · 1 of 1 slices shown (13 of 15)]
[im 1/1]
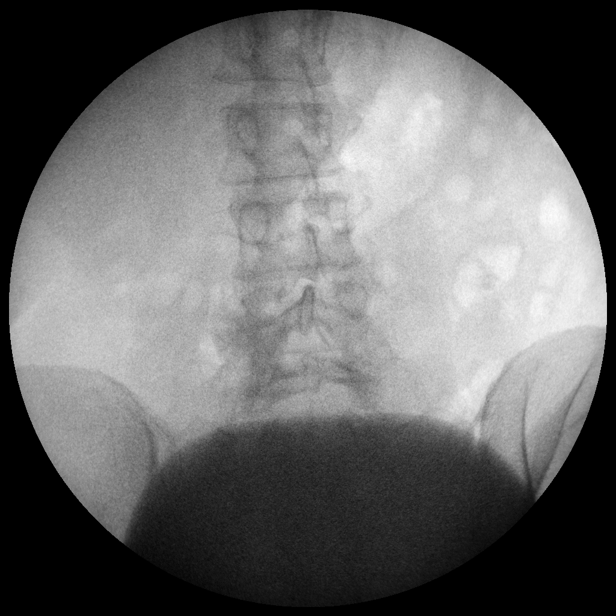

[Series 18: run · 1 of 1 slices shown (14 of 15)]
[im 1/1]
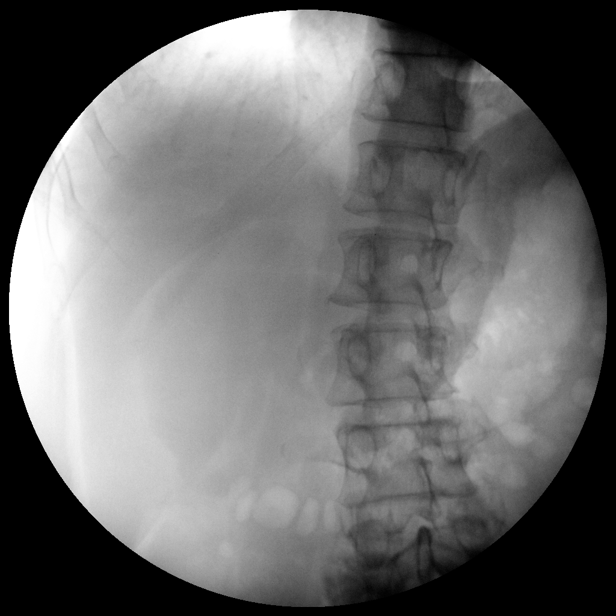

[Series 19: run · 1 of 1 slices shown (15 of 15)]
[im 1/1]
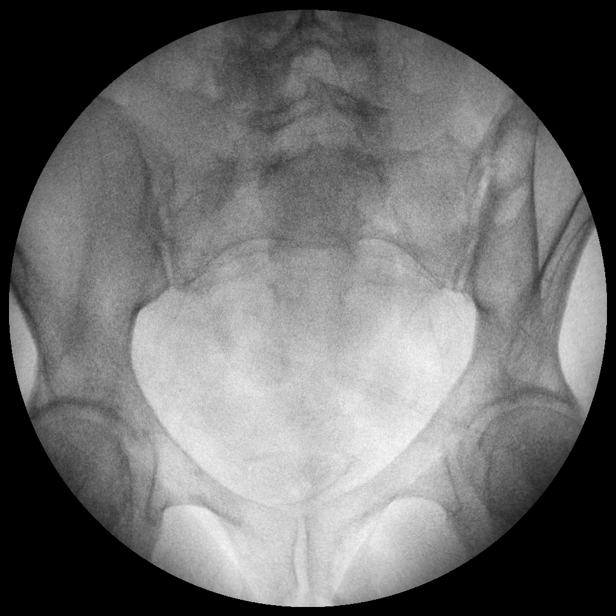

[15 of 19 positions shown; findings below may reference images not displayed]

FINDINGS: The urinary bladder has a normal appearance. No contour or
abnormality. No abnormal filling defects noted. At maximal bladder
filling the catheter was removed and the patient voided. No
significant postvoid residual identified. No reflux of contrast
material identified within the ureters or kidneys.
IMPRESSION: Normal voiding cystourethrogram.

## 2016-01-06 ENCOUNTER — Encounter: Payer: Self-pay | Admitting: Internal Medicine

## 2016-01-06 ENCOUNTER — Ambulatory Visit (INDEPENDENT_AMBULATORY_CARE_PROVIDER_SITE_OTHER): Payer: Self-pay | Admitting: Internal Medicine

## 2016-01-06 ENCOUNTER — Telehealth: Payer: Self-pay | Admitting: *Deleted

## 2016-01-06 VITALS — BP 164/76 | HR 64 | Temp 98.1°F | Ht 63.0 in | Wt 126.8 lb

## 2016-01-06 DIAGNOSIS — M4712 Other spondylosis with myelopathy, cervical region: Secondary | ICD-10-CM

## 2016-01-06 DIAGNOSIS — M5416 Radiculopathy, lumbar region: Secondary | ICD-10-CM

## 2016-01-06 DIAGNOSIS — Z79899 Other long term (current) drug therapy: Secondary | ICD-10-CM

## 2016-01-06 DIAGNOSIS — I10 Essential (primary) hypertension: Secondary | ICD-10-CM

## 2016-01-06 DIAGNOSIS — F17210 Nicotine dependence, cigarettes, uncomplicated: Secondary | ICD-10-CM

## 2016-01-06 NOTE — Assessment & Plan Note (Signed)
BP Readings from Last 3 Encounters:  01/06/16 (!) 164/76  12/25/15 (!) 182/77  11/22/15 (!) 186/99   Her blood pressure was still high today. He states that at home when she checked it twice it was between 138 140/78. spironolactone 25 mg daily was added to her current regimen because of uncontrolled hypertension during her last visit at clinic.  She said that she is compliant with her medicines, and further questioning the she said she cannot afford her medicine so she get her medicines from different resources for example [facility], [facility] outpatient, health Department, [employer]. It was really hard to get her refill history.  We will check her BMP for any hyperkalemia today.  Continue with the current management. Reassess during her follow-up visit with her PCP.

## 2016-01-06 NOTE — Assessment & Plan Note (Signed)
She had an history of chronic neck and lower back pain, she was demanding to get an cervical MRI done so she can go to [facility] and get a cervical spinal surgery there. She was seen by a orthopedic surgeon at [facility] in March 2017 and was referred for physical therapy at that point with consideration of surgery if her symptoms do not improve. She never went for physical therapy stating that she cannot afford it. She missed her orthopedic surgery appointment which was supposed to be in may 2017. She was keep pointing out that she has a Chief Executive Officer for her  chronic conditions.  She was provided with another referral during her last follow-up visit to [facility]. We advised her that she should go see her orthopedic surgeon. As most of the time they wanted to get an MRI done at their own facility so they can have a better look of pictures.

## 2016-01-06 NOTE — Telephone Encounter (Signed)
Call to [facility] per request of Dr. Lynnae January to schedule patient for a follow up appointment. Patient unclear as to who or where she was seen at [facility]. Patient was seen per the operator on 07/05/2015 with Dr. Brigitte Pulse .  Dr Brigitte Pulse had referredd her to another doctor there with an appointment on 07/10/2015.  Patient did not keep that appointment.  Cancelled the appointment as she did not have the co-pay,.  She was rescheduled today with Dr. Genia Harold for 01/24/2016 at 8:40 AM to arrive by 8:20 AM.  Patient aware of the appointment.  Given card with the appointment date and time.  Patient to be called a few days before the appointment to discuss payment  by the hospital.  Patient aware and will go to the appointment.  Sander Nephew, RN 01/06/2016 5:01 PM

## 2016-01-06 NOTE — Patient Instructions (Signed)
Thank you for this interesting the clinic today. Your blood pressure is still little high, please continue taking your medicines as directed. We are going to check your blood workup today to make sure that your electrolytes are okay. You are provided with your recent lumbar MRI results. You can follow-up with your PCP as scheduled.

## 2016-01-06 NOTE — Assessment & Plan Note (Signed)
She was having a lot of lower backache and leg symptoms during her previous visit. He was ordered a lumbar MRI at that point. Her results are. IMPRESSION: 1. Transitional lumbosacral anatomy. Same numbering system as on the prior MRIs. Correlation with radiographs is recommended prior to any operative intervention. 2. Chronic moderate to severe facet arthropathy at L3-L4 and L4-L5. Continued degenerative appearing marrow edema affecting the right side facet at those levels, although the left side marrow edema has resolved. 3. Chronic mild to moderate right L4 foraminal stenosis. No spinal stenosis or interval increased neural impingement.  She states that she cannot afford physical therapy. She was provided with a copy of her MRI results.
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
## 2016-01-07 LAB — BMP8+ANION GAP
Anion Gap: 19 mmol/L — ABNORMAL HIGH (ref 10.0–18.0)
BUN / CREAT RATIO: 15 (ref 9–23)
BUN: 11 mg/dL (ref 6–24)
CO2: 21 mmol/L (ref 18–29)
Calcium: 9.8 mg/dL (ref 8.7–10.2)
Chloride: 100 mmol/L (ref 96–106)
Creatinine, Ser: 0.71 mg/dL (ref 0.57–1.00)
GFR, EST AFRICAN AMERICAN: 112 mL/min/{1.73_m2} (ref >59)
GFR, EST NON AFRICAN AMERICAN: 97 mL/min/{1.73_m2} (ref >59)
Glucose: 97 mg/dL (ref 65–99)
POTASSIUM: 4.3 mmol/L (ref 3.5–5.2)
SODIUM: 140 mmol/L (ref 134–144)

## 2016-01-08 NOTE — Progress Notes (Signed)
Internal Medicine Clinic Attending  I saw and evaluated the patient.  I personally confirmed the key portions of the history and exam documented by Dr. Reesa Chew and I reviewed pertinent patient test results.  The assessment, diagnosis, and plan were formulated together and I agree with the documentation in the resident's note. Brenda [name] is a difficult situation as she has chronic medical needs but has no insurance and has financial difficulties. Her concern today was obtaining a cervical MRI at [facility]. We reviewed her notes from [facility] who wanted to maximize optimal non-surgical therapy with physiatry and pain clinic before even considering surgery. Due to financial restraints she has not completed these referrals. Due to a sub optimal conservative trial we are not ordering an MRI that are referring her to her surgeon at [facility] who can then decide whether an MRI is warranted. Regino Schultze was able to get her an appointment in early October.

## 2016-01-10 ENCOUNTER — Telehealth: Payer: Self-pay | Admitting: Internal Medicine

## 2016-01-10 NOTE — Telephone Encounter (Signed)
CALLED PT LMTCB IT IS TIME TO RENEW GCCN CARD

## 2016-01-13 ENCOUNTER — Encounter: Payer: Self-pay | Admitting: Internal Medicine

## 2016-01-13 ENCOUNTER — Telehealth: Payer: Self-pay | Admitting: Licensed Clinical Social Worker

## 2016-01-13 NOTE — Telephone Encounter (Signed)
CSW received incoming call from Ms. Tarquinio.  Pt currently lives is supportive housing and is in need of letter/work excuse.  Pt's housing requests residents to explore employment opportunities; however, Ms. Cortinas states she has been unable to look for employment due to her current medical status.  Ms. Lerner requesting letter to be placed in the mail. CSW informed Ms. Dotzler this request will be forwarded to PCP.  In addition to work excuse letter, Ms. Gramajo requesting CSW to assist with [facility] Form as she has not heard back from previous application.  Ms. Upperman aware if form is available online, CSW will leave with financial counselor for pt to receive during her upcoming appointment with financial counselor.

## 2016-01-14 ENCOUNTER — Encounter: Payer: Self-pay | Admitting: *Deleted

## 2016-02-06 ENCOUNTER — Encounter: Payer: Self-pay | Admitting: Internal Medicine

## 2016-02-12 ENCOUNTER — Telehealth: Payer: Self-pay | Admitting: *Deleted

## 2016-02-12 NOTE — Telephone Encounter (Signed)
Call from [facility] - states has Percocet rx , written on 9/13, which says do not fill until 30 days after one filled. The pharmacist states ,per their records, last filled on Oct 3. Wants to know if they can go ahead refill this rx on the 3rd? Please advise  Thanks

## 2016-02-12 NOTE — Telephone Encounter (Signed)
That is alright to refill today as it is technically 30 days since last refill even though not calendar month.  [name], MD IMTS - PGY1 Pager [phone]

## 2016-02-13 NOTE — Telephone Encounter (Signed)
Called the pharmacy - stated spoke to another nurse who ok'd the refill.

## 2016-02-26 ENCOUNTER — Ambulatory Visit: Payer: Self-pay

## 2016-03-03 ENCOUNTER — Ambulatory Visit: Payer: Self-pay
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
## 2016-03-06 IMAGING — CR DG SHOULDER 2+V*R*
3 series · 3 of 3 positions shown · non-contrast
Comparison: None.

CLINICAL DATA: Back pain, shoulder pain

EXAM:
RIGHT SHOULDER - 2+ VIEW

[w shoulder y-view right (1 of 2)]
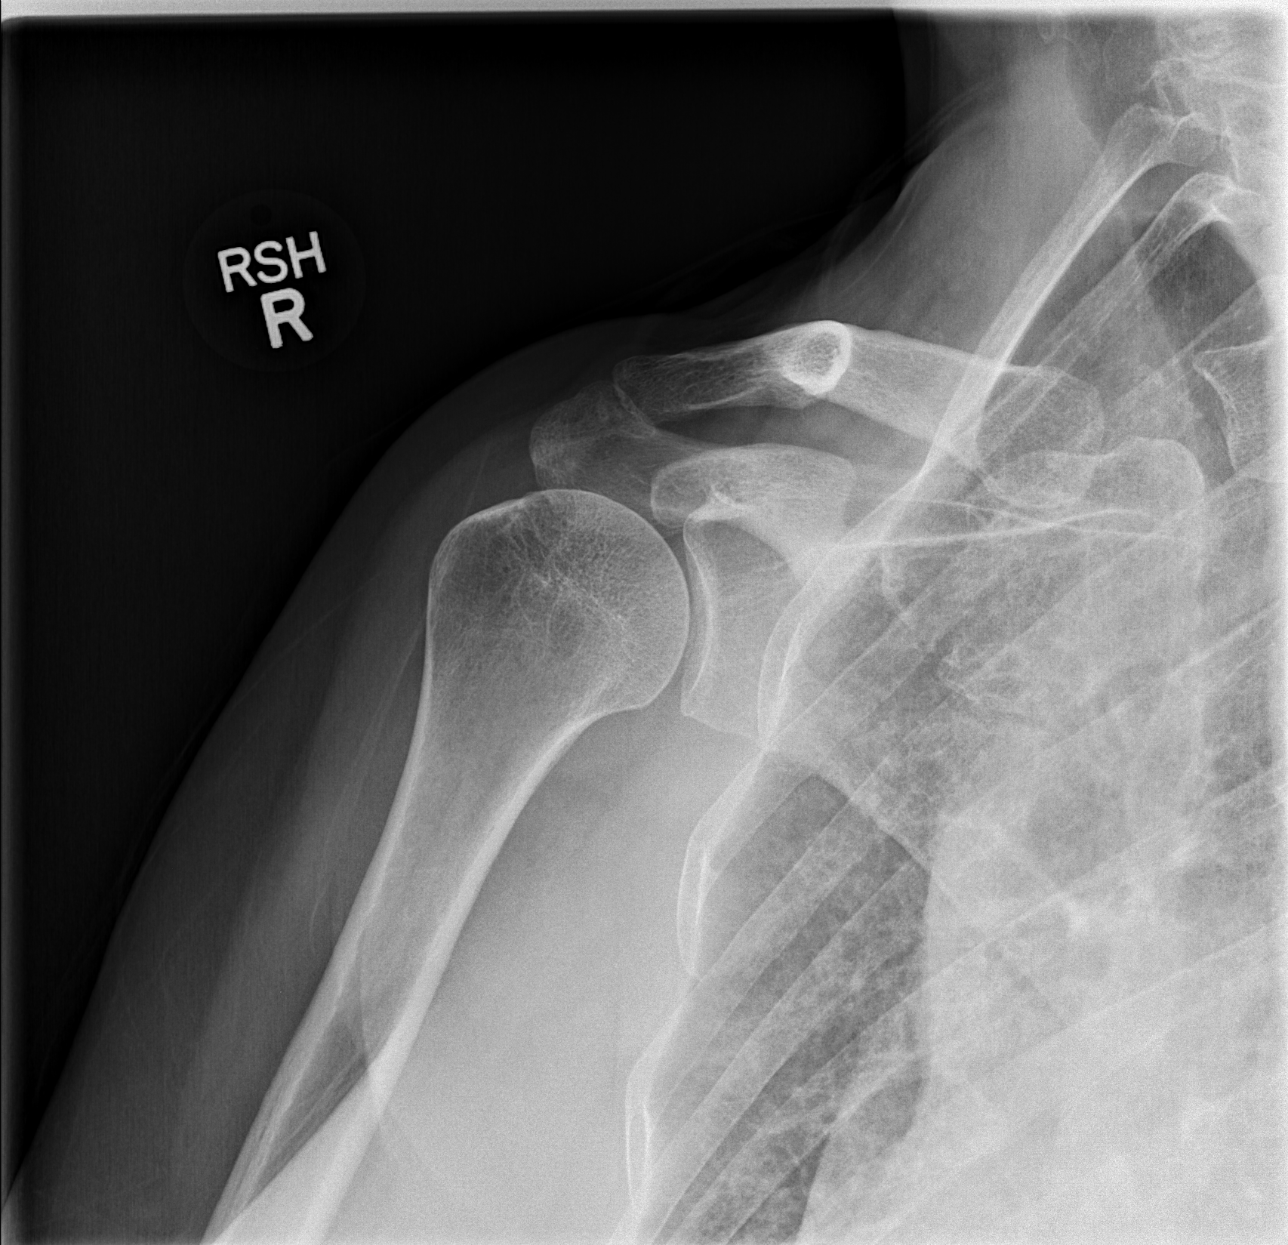

[w shoulder y-view right (2 of 2)]
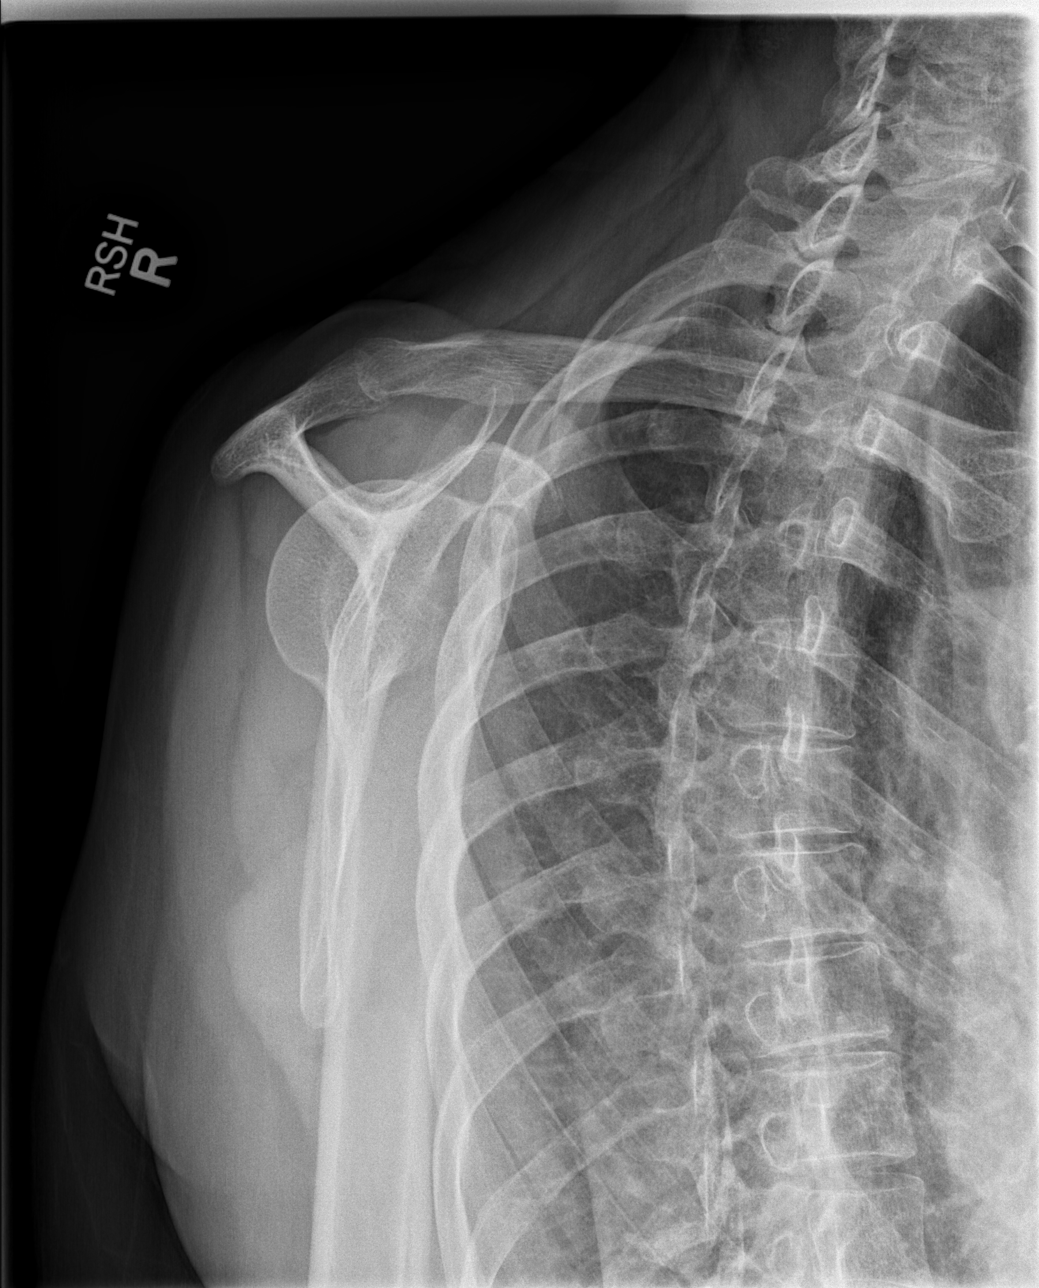

[x shoulder ap right]
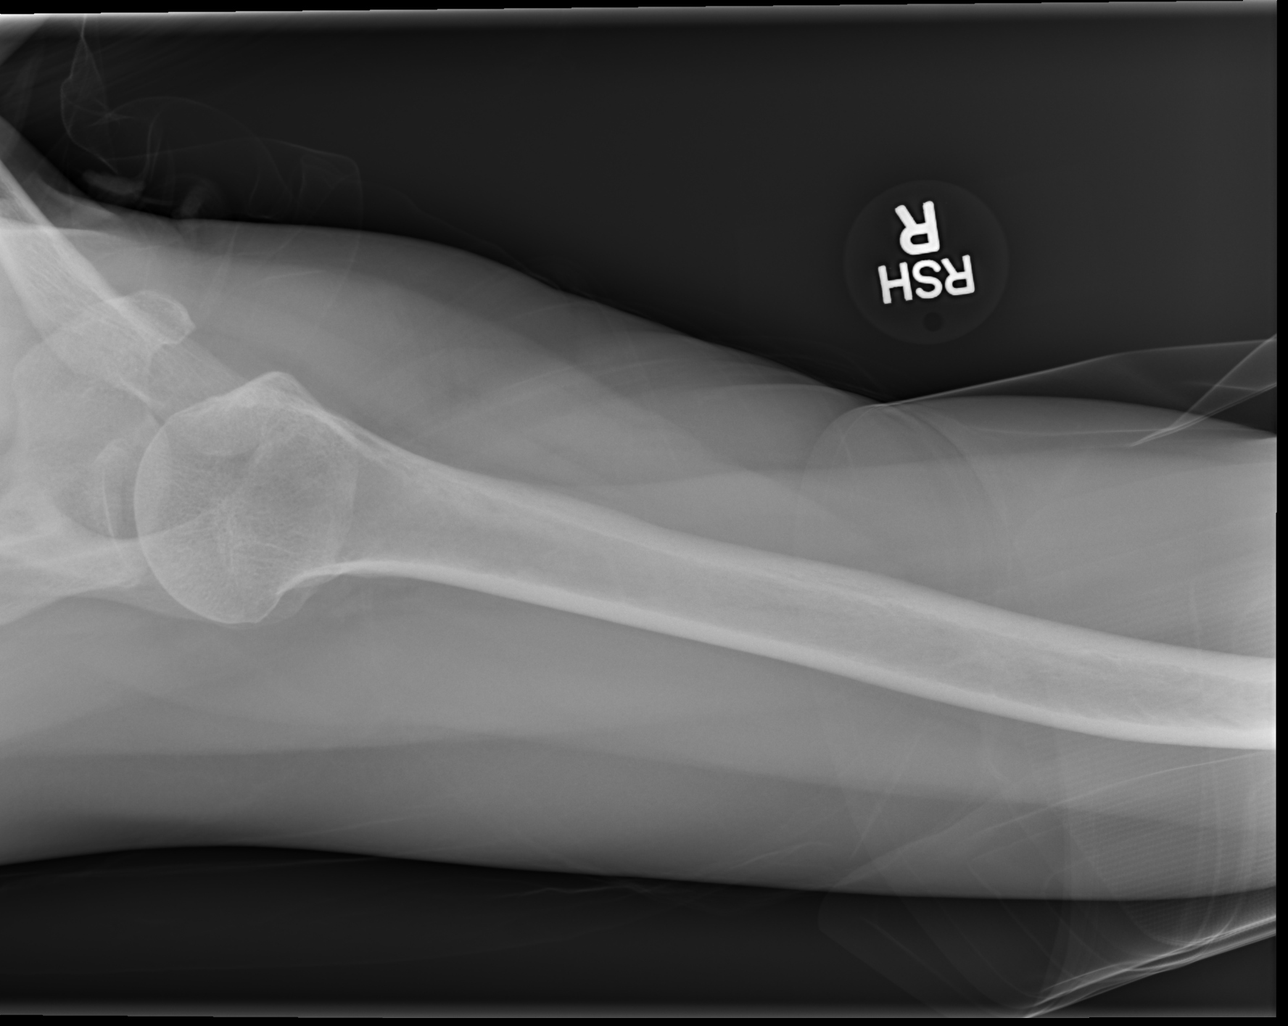

[3 of 3 positions shown; findings below may reference images not displayed]

FINDINGS: There is no evidence of fracture or dislocation. There is no
evidence of arthropathy or other focal bone abnormality. Soft
tissues are unremarkable.
IMPRESSION: Negative.
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
## 2016-03-10 ENCOUNTER — Telehealth: Payer: Self-pay | Admitting: Internal Medicine

## 2016-03-10 NOTE — Telephone Encounter (Signed)
APT. REMINDER CALL, LMTCB °

## 2016-03-11 ENCOUNTER — Encounter: Payer: Self-pay | Admitting: Pulmonary Disease

## 2016-03-11 ENCOUNTER — Ambulatory Visit (INDEPENDENT_AMBULATORY_CARE_PROVIDER_SITE_OTHER): Payer: Self-pay | Admitting: Internal Medicine

## 2016-03-11 ENCOUNTER — Ambulatory Visit (INDEPENDENT_AMBULATORY_CARE_PROVIDER_SITE_OTHER): Payer: Self-pay | Admitting: Pulmonary Disease

## 2016-03-11 ENCOUNTER — Encounter: Payer: Self-pay | Admitting: Internal Medicine

## 2016-03-11 DIAGNOSIS — F419 Anxiety disorder, unspecified: Secondary | ICD-10-CM

## 2016-03-11 DIAGNOSIS — K588 Other irritable bowel syndrome: Secondary | ICD-10-CM

## 2016-03-11 DIAGNOSIS — F17290 Nicotine dependence, other tobacco product, uncomplicated: Secondary | ICD-10-CM

## 2016-03-11 DIAGNOSIS — E7849 Other hyperlipidemia: Secondary | ICD-10-CM

## 2016-03-11 DIAGNOSIS — K580 Irritable bowel syndrome with diarrhea: Secondary | ICD-10-CM

## 2016-03-11 DIAGNOSIS — Z79899 Other long term (current) drug therapy: Secondary | ICD-10-CM

## 2016-03-11 DIAGNOSIS — M5416 Radiculopathy, lumbar region: Secondary | ICD-10-CM

## 2016-03-11 DIAGNOSIS — J432 Centrilobular emphysema: Secondary | ICD-10-CM

## 2016-03-11 DIAGNOSIS — F329 Major depressive disorder, single episode, unspecified: Secondary | ICD-10-CM

## 2016-03-11 DIAGNOSIS — Z760 Encounter for issue of repeat prescription: Secondary | ICD-10-CM

## 2016-03-11 DIAGNOSIS — C3432 Malignant neoplasm of lower lobe, left bronchus or lung: Secondary | ICD-10-CM

## 2016-03-11 DIAGNOSIS — F319 Bipolar disorder, unspecified: Secondary | ICD-10-CM

## 2016-03-11 DIAGNOSIS — I10 Essential (primary) hypertension: Secondary | ICD-10-CM

## 2016-03-11 DIAGNOSIS — F418 Other specified anxiety disorders: Secondary | ICD-10-CM

## 2016-03-11 DIAGNOSIS — G894 Chronic pain syndrome: Secondary | ICD-10-CM

## 2016-03-11 DIAGNOSIS — E785 Hyperlipidemia, unspecified: Secondary | ICD-10-CM

## 2016-03-11 DIAGNOSIS — M4712 Other spondylosis with myelopathy, cervical region: Secondary | ICD-10-CM

## 2016-03-11 MED ORDER — OXYCODONE-ACETAMINOPHEN 10-325 MG PO TABS: 1.0000 | ORAL_TABLET | Freq: Three times a day (TID) | ORAL | 0 refills | Status: DC | PRN

## 2016-03-11 MED ORDER — FAMOTIDINE 20 MG PO TABS: 20.0000 mg | ORAL_TABLET | Freq: Two times a day (BID) | ORAL | 3 refills | Status: DC

## 2016-03-11 MED ORDER — AMLODIPINE BESYLATE 10 MG PO TABS: 10.0000 mg | ORAL_TABLET | Freq: Every day | ORAL | 3 refills | Status: DC

## 2016-03-11 MED ORDER — LISINOPRIL-HYDROCHLOROTHIAZIDE 20-12.5 MG PO TABS: 2.0000 | ORAL_TABLET | Freq: Every day | ORAL | 3 refills | Status: DC

## 2016-03-11 MED ORDER — SERTRALINE HCL 100 MG PO TABS: 100.0000 mg | ORAL_TABLET | Freq: Every day | ORAL | 3 refills | Status: DC

## 2016-03-11 MED ORDER — SPIRONOLACTONE 25 MG PO TABS: 25.0000 mg | ORAL_TABLET | Freq: Every day | ORAL | 2 refills | Status: DC

## 2016-03-11 MED ORDER — CYCLOBENZAPRINE HCL 5 MG PO TABS: 5.0000 mg | ORAL_TABLET | Freq: Three times a day (TID) | ORAL | 2 refills | Status: DC | PRN

## 2016-03-11 MED ORDER — DICYCLOMINE HCL 20 MG PO TABS: 20.0000 mg | ORAL_TABLET | Freq: Three times a day (TID) | ORAL | 2 refills | Status: DC

## 2016-03-11 MED ORDER — SIMVASTATIN 20 MG PO TABS: 20.0000 mg | ORAL_TABLET | Freq: Every evening | ORAL | 3 refills | Status: DC

## 2016-03-11 MED ORDER — ASPIRIN EC 81 MG PO TBEC: 81.0000 mg | DELAYED_RELEASE_TABLET | Freq: Every day | ORAL | 0 refills | Status: DC

## 2016-03-11 MED ORDER — DIVALPROEX SODIUM 250 MG PO DR TAB: 500.0000 mg | DELAYED_RELEASE_TABLET | Freq: Two times a day (BID) | ORAL | 2 refills | Status: DC

## 2016-03-11 MED ORDER — METOPROLOL TARTRATE 25 MG PO TABS: 25.0000 mg | ORAL_TABLET | Freq: Two times a day (BID) | ORAL | 3 refills | Status: DC

## 2016-03-11 MED ORDER — DICLOFENAC SODIUM 1 % TD GEL: 2.0000 g | Freq: Four times a day (QID) | TRANSDERMAL | 3 refills | Status: DC | PRN

## 2016-03-11 MED ORDER — GABAPENTIN 300 MG PO CAPS: 600.0000 mg | ORAL_CAPSULE | Freq: Three times a day (TID) | ORAL | 3 refills | Status: DC

## 2016-03-11 MED ORDER — MIRTAZAPINE 15 MG PO TABS: 15.0000 mg | ORAL_TABLET | Freq: Every day | ORAL | 2 refills | Status: DC
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
## 2016-03-11 NOTE — Patient Instructions (Signed)
Keep your appointment with the thoracic oncology service We'll see you back on an as-needed basis

## 2016-03-11 NOTE — Patient Instructions (Addendum)
It was nice seeing you today!  I have provided you with your prescriptions.   We'll see you in February for a follow up visit. Hope your holidays are great! Let us know if you need anything before your next visit.

## 2016-03-11 NOTE — Assessment & Plan Note (Signed)
She will continue follow-up with Dr. Earlie Server. June 2017 chest imaging showed no evidence of recurrence.
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
## 2016-03-11 NOTE — Assessment & Plan Note (Signed)
She has mild centrilobular emphysema but on her October 2016 pulmonary function testing she did not have COPD.  I counseled her today that she does not need bronchodilator therapy. However, she is at increased risk for severe respiratory infections with her emphysema so advised that she get a flu shot every year. She's had one this year. I also counseled her on the importance of hand hygiene.  Follow-up with [place] on an as-needed basis

## 2016-03-13 ENCOUNTER — Encounter: Payer: Self-pay | Admitting: Internal Medicine

## 2016-03-13 NOTE — Assessment & Plan Note (Signed)
Patient with non-occlusive CAD on simvastatin therapy.  Plan: --refill simvastatin '20mg'$  daily

## 2016-03-13 NOTE — Assessment & Plan Note (Signed)
Patient with D-IBS controlled with Bentyl.  Plan: --Refill Bentyl '20mg'$  TID before meals and bedtime

## 2016-03-13 NOTE — Assessment & Plan Note (Signed)
Patient on amlodipine '10mg'$  daily, lisinopril-HCTZ 20-12.'5mg'$  BID, metoprolol '25mg'$  BID, and newly added spironolactone '25mg'$  daily in August. Patient had f/u visit after starting spironolactone with normal potassium. She had visited Dr. Lake Bells in pulm earlier today with BP of 124/82, but at our clinic she was hypertensive consistently in the 150's SBP. Patient endorsed a lot of stress after her visit that morning with multiple appointments being rescheduled and pushed back which was frustrating to her.  Plan: --continue current regimen, will reassess at f/u visit
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
## 2016-03-13 NOTE — Assessment & Plan Note (Signed)
Patient with Bipolar Disorder compliant on Depakote.  Plan: --refill Depakote '500mg'$  BID --continue encouraging patient to f/u with mental health services

## 2016-03-13 NOTE — Assessment & Plan Note (Addendum)
Patient with history of lumbar radiculopathy with sciatica and cervical spondylosis with myopathy and radiculopathy. Patient has appointment with [facility] whom she has seen before. Patient unable to afford PT/OT services in the past. Patient just now approved for [facility]. No alarm symptoms at this time  Plan: --refill percocet 10-'325mg'$  TID PRN #[id number] x3 to last through February.  --refilled gabapentin '600mg'$  TID, voltaren gel, flexeril '5mg'$  TID PRN --Plan on referral to pain clinic at follow up visit --f/u [facility] visit and recommendations.

## 2016-03-15 NOTE — Progress Notes (Signed)
Internal Medicine Clinic Attending  I saw and evaluated the patient.  I personally confirmed the key portions of the history and exam documented by Dr. Svalina and I reviewed pertinent patient test results.  The assessment, diagnosis, and plan were formulated together and I agree with the documentation in the resident's note.  

## 2016-03-23 ENCOUNTER — Other Ambulatory Visit (HOSPITAL_BASED_OUTPATIENT_CLINIC_OR_DEPARTMENT_OTHER): Payer: Self-pay

## 2016-03-23 ENCOUNTER — Ambulatory Visit (HOSPITAL_COMMUNITY)
Admission: RE | Admit: 2016-03-23 | Discharge: 2016-03-23 | Disposition: A | Discharge status: Home / Self Care | Payer: Self-pay | Source: Ambulatory Visit | Attending: Internal Medicine | Admitting: Internal Medicine

## 2016-03-23 ENCOUNTER — Encounter (HOSPITAL_COMMUNITY): Payer: Self-pay

## 2016-03-23 DIAGNOSIS — Z9889 Other specified postprocedural states: Secondary | ICD-10-CM | POA: Insufficient documentation

## 2016-03-23 DIAGNOSIS — Z85118 Personal history of other malignant neoplasm of bronchus and lung: Secondary | ICD-10-CM

## 2016-03-23 DIAGNOSIS — J439 Emphysema, unspecified: Secondary | ICD-10-CM | POA: Insufficient documentation

## 2016-03-23 DIAGNOSIS — I10 Essential (primary) hypertension: Secondary | ICD-10-CM

## 2016-03-23 DIAGNOSIS — I700 Atherosclerosis of aorta: Secondary | ICD-10-CM | POA: Insufficient documentation

## 2016-03-23 DIAGNOSIS — C3432 Malignant neoplasm of lower lobe, left bronchus or lung: Secondary | ICD-10-CM | POA: Insufficient documentation

## 2016-03-23 LAB — COMPREHENSIVE METABOLIC PANEL
ALBUMIN: 4 g/dL (ref 3.5–5.0)
ALT: 13 U/L (ref 0–55)
AST: 20 U/L (ref 5–34)
Alkaline Phosphatase: 112 U/L (ref 40–150)
Anion Gap: 10 mEq/L (ref 3–11)
BUN: 17.8 mg/dL (ref 7.0–26.0)
CO2: 21 mEq/L — ABNORMAL LOW (ref 22–29)
CREATININE: 0.8 mg/dL (ref 0.6–1.1)
Calcium: 9.6 mg/dL (ref 8.4–10.4)
Chloride: 107 mEq/L (ref 98–109)
EGFR: 82 mL/min/{1.73_m2} — ABNORMAL LOW (ref >90)
Glucose: 101 mg/dl (ref 70–140)
POTASSIUM: 4.2 meq/L (ref 3.5–5.1)
Sodium: 139 mEq/L (ref 136–145)
Total Bilirubin: 0.3 mg/dL (ref 0.20–1.20)
Total Protein: 8.3 g/dL (ref 6.4–8.3)

## 2016-03-23 LAB — CBC WITH DIFFERENTIAL/PLATELET
BASO%: 0.3 % (ref 0.0–2.0)
BASOS ABS: 0 10*3/uL (ref 0.0–0.1)
EOS ABS: 0.3 10*3/uL (ref 0.0–0.5)
EOS%: 3.4 % (ref 0.0–7.0)
HEMATOCRIT: 41.8 % (ref 34.8–46.6)
HEMOGLOBIN: 14 g/dL (ref 11.6–15.9)
LYMPH#: 3 10*3/uL (ref 0.9–3.3)
LYMPH%: 32.6 % (ref 14.0–49.7)
MCH: 32.6 pg (ref 25.1–34.0)
MCHC: 33.6 g/dL (ref 31.5–36.0)
MCV: 97.2 fL (ref 79.5–101.0)
MONO#: 0.5 10*3/uL (ref 0.1–0.9)
MONO%: 5.2 % (ref 0.0–14.0)
NEUT#: 5.3 10*3/uL (ref 1.5–6.5)
NEUT%: 58.5 % (ref 38.4–76.8)
PLATELETS: 272 10*3/uL (ref 145–400)
RBC: 4.3 10*6/uL (ref 3.70–5.45)
RDW: 14.6 % — AB (ref 11.2–14.5)
WBC: 9.1 10*3/uL (ref 3.9–10.3)

## 2016-03-23 MED ORDER — IOPAMIDOL (ISOVUE-300) INJECTION 61%
INTRAVENOUS | Status: AC
  Filled 2016-03-23: qty 75

## 2016-03-23 MED ORDER — SODIUM CHLORIDE 0.9 % IJ SOLN
INTRAMUSCULAR | Status: AC
  Filled 2016-03-23: qty 50

## 2016-03-23 MED ORDER — IOPAMIDOL (ISOVUE-300) INJECTION 61%
75.0000 mL | Freq: Once | INTRAVENOUS | Status: AC | PRN
  Administered 2016-03-23: 75 mL via INTRAVENOUS

## 2016-03-30 ENCOUNTER — Ambulatory Visit (HOSPITAL_BASED_OUTPATIENT_CLINIC_OR_DEPARTMENT_OTHER): Payer: No Typology Code available for payment source | Admitting: Internal Medicine

## 2016-03-30 ENCOUNTER — Encounter: Payer: Self-pay | Admitting: Internal Medicine

## 2016-03-30 VITALS — BP 148/84 | HR 87 | Temp 98.8°F | Resp 18 | Ht 63.0 in | Wt 126.5 lb

## 2016-03-30 DIAGNOSIS — Z85118 Personal history of other malignant neoplasm of bronchus and lung: Secondary | ICD-10-CM

## 2016-03-30 DIAGNOSIS — C3432 Malignant neoplasm of lower lobe, left bronchus or lung: Secondary | ICD-10-CM
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
## 2016-03-31 ENCOUNTER — Encounter: Payer: Self-pay | Admitting: Thoracic Surgery (Cardiothoracic Vascular Surgery)

## 2016-04-07 ENCOUNTER — Encounter: Payer: No Typology Code available for payment source | Admitting: Thoracic Surgery (Cardiothoracic Vascular Surgery)

## 2016-04-09 ENCOUNTER — Encounter: Payer: Self-pay | Admitting: Internal Medicine

## 2016-04-09 DIAGNOSIS — F1120 Opioid dependence, uncomplicated: Secondary | ICD-10-CM | POA: Insufficient documentation

## 2016-04-13 HISTORY — PX: LOBECTOMY: SHX5089

## 2016-04-15 ENCOUNTER — Telehealth: Payer: Self-pay | Admitting: Internal Medicine

## 2016-04-15 NOTE — Telephone Encounter (Signed)
I called the [phone] number this morning and a gentleman answered and said that she was not there and had a different number he would call back with it because he did not have it memorized, and never called back.  I tried the other numbers that have and I got no answer or voicemail

## 2016-04-23 ENCOUNTER — Telehealth: Payer: Self-pay | Admitting: Internal Medicine

## 2016-04-23 NOTE — Telephone Encounter (Signed)
Left message re June lab/fu. Schedule mailed.

## 2016-05-24 IMAGING — CR DG LUMBAR SPINE COMPLETE 4+V
5 series · 5 of 5 positions shown · non-contrast
Comparison: Lumbar spine MRI 11/02/2013

CLINICAL DATA: Low back pain, fall 1 day ago

EXAM:
LUMBAR SPINE - COMPLETE 4+ VIEW

[t l-spine a.p.]
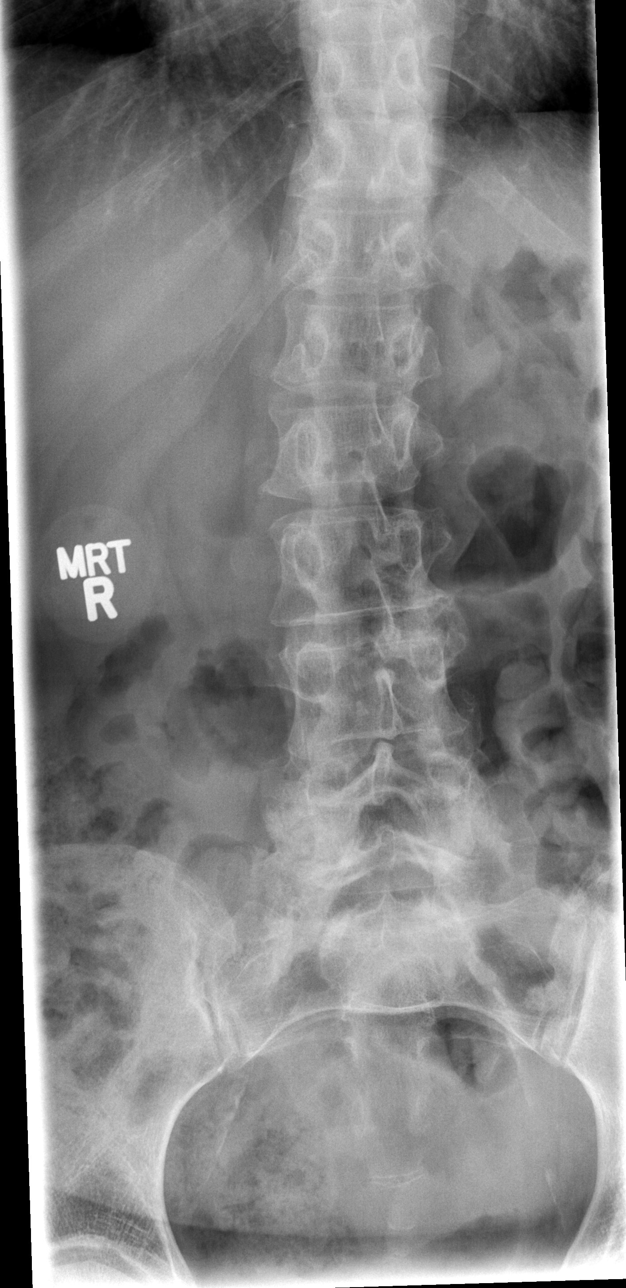

[t l-spine oblique exposure (1 of 2)]
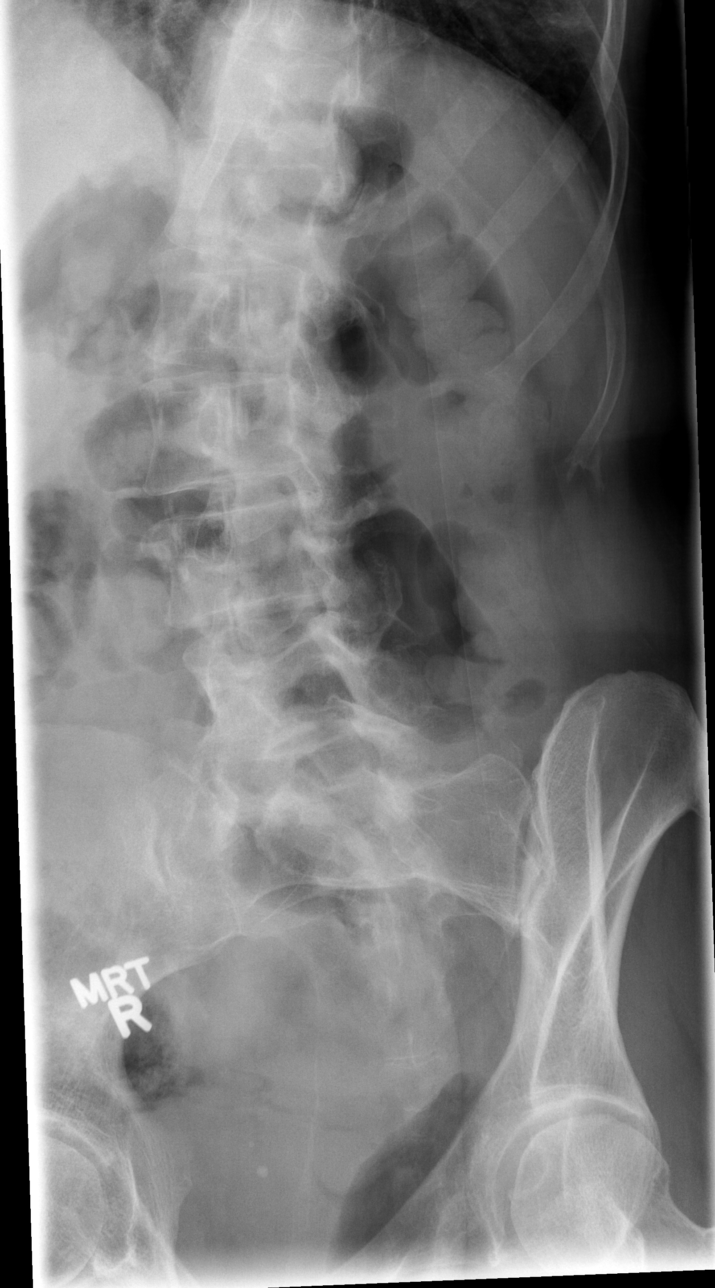

[t l-spine oblique exposure (2 of 2)]
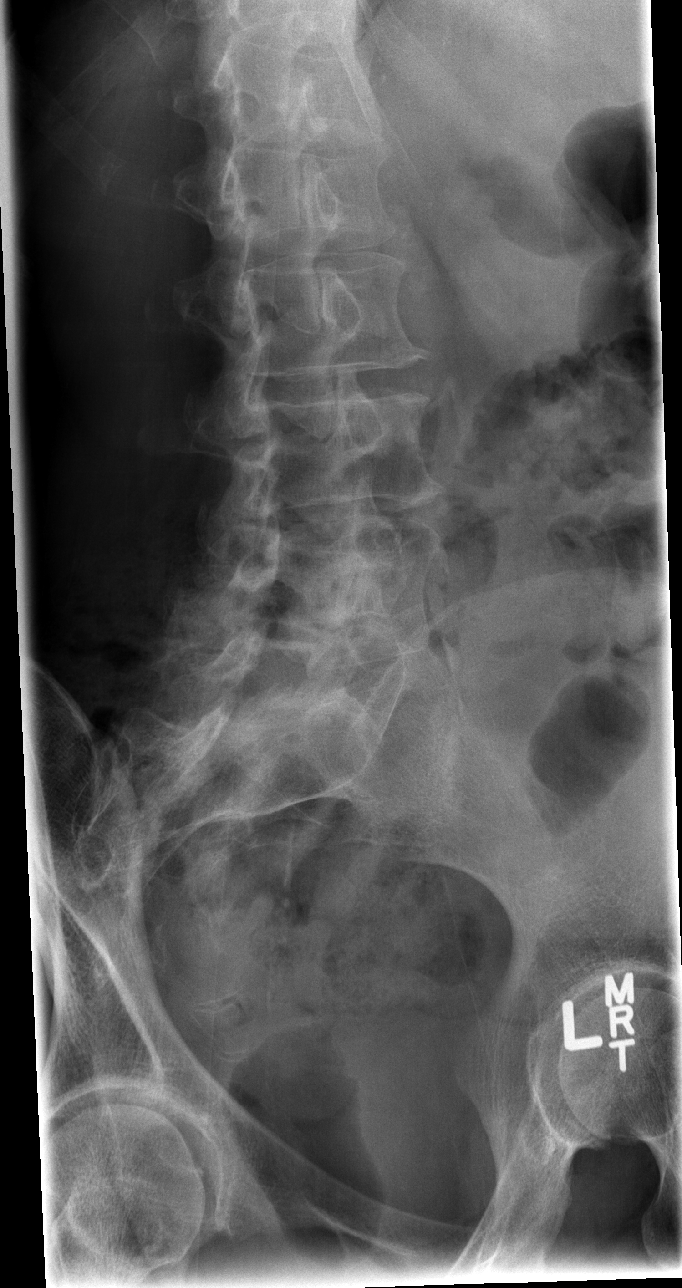

[t l-spine lat]
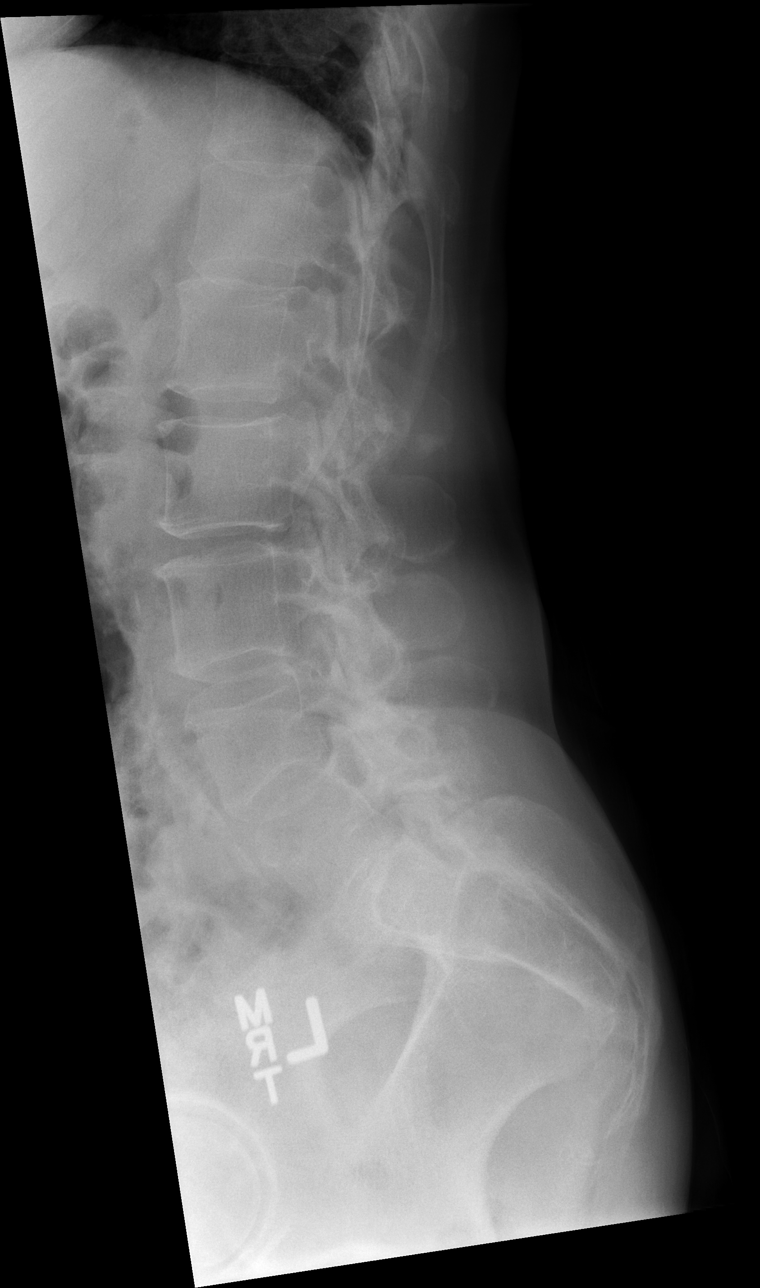

[t l-spine l5-s1 spot]
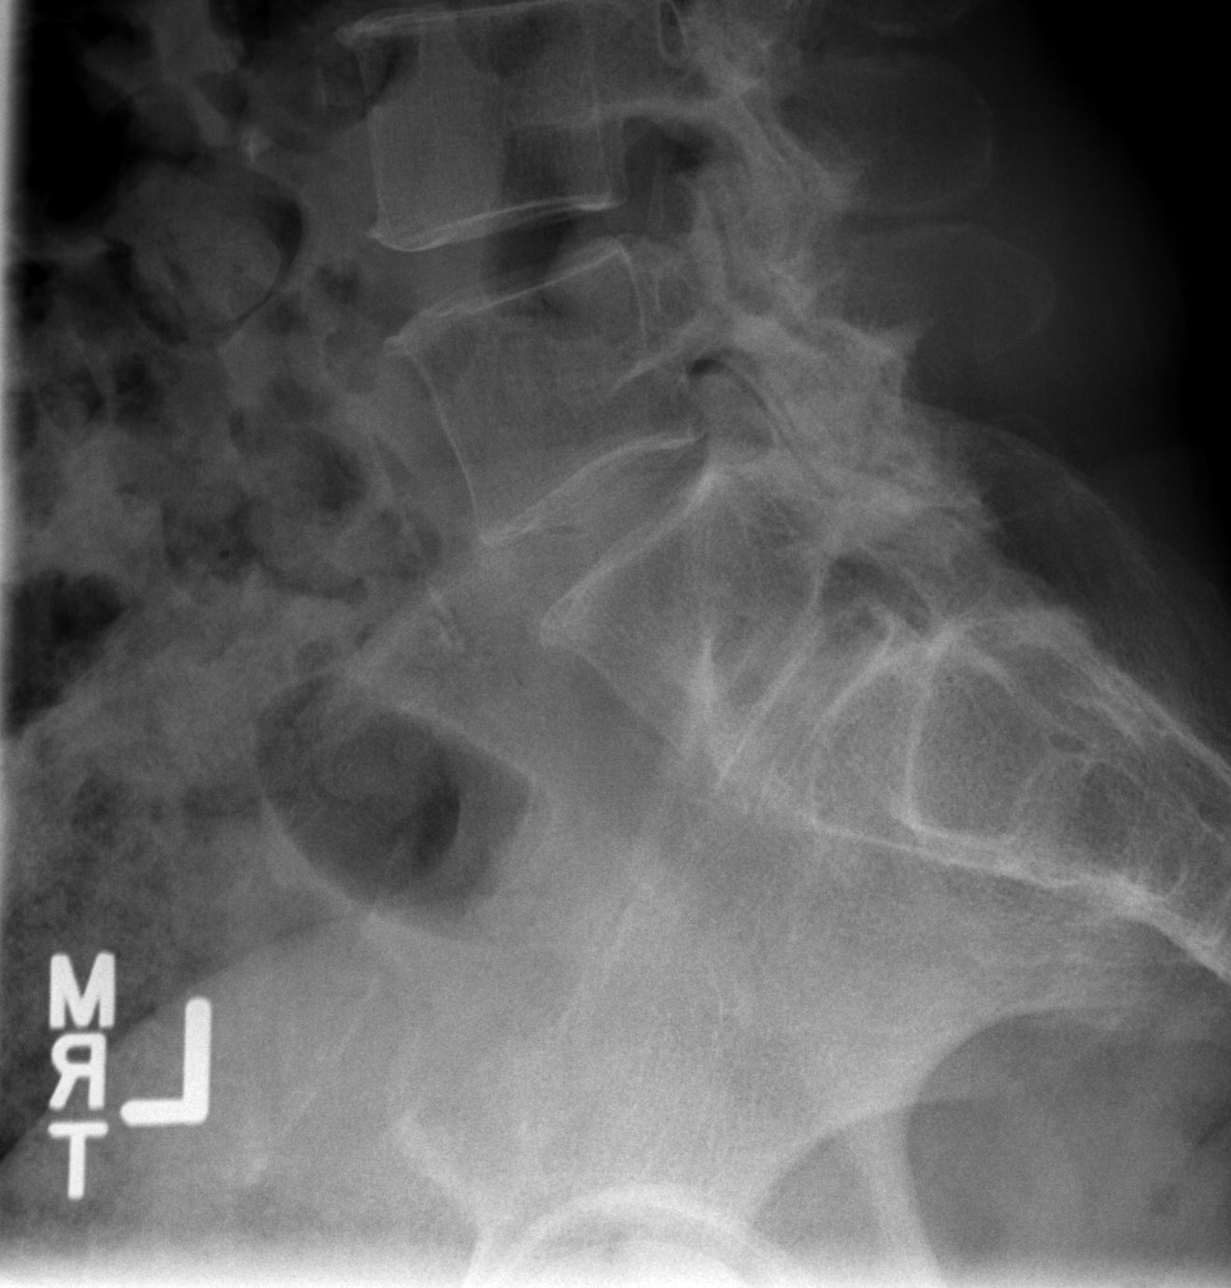

[5 of 5 positions shown; findings below may reference images not displayed]

FINDINGS: There is no evidence of lumbar spine fracture. Alignment is normal.
Intervertebral disc spaces are maintained. Mild rightward curvature
of the lumbar spine centered at L3 noted. Mild atheromatous aortic
calcification noted without aneurysm.
IMPRESSION: Negative.

## 2016-06-01 ENCOUNTER — Encounter: Payer: Self-pay | Admitting: Pulmonary Disease

## 2016-06-01 ENCOUNTER — Encounter (INDEPENDENT_AMBULATORY_CARE_PROVIDER_SITE_OTHER): Payer: Self-pay

## 2016-06-01 ENCOUNTER — Ambulatory Visit (INDEPENDENT_AMBULATORY_CARE_PROVIDER_SITE_OTHER): Payer: No Typology Code available for payment source | Admitting: Pulmonary Disease

## 2016-06-01 VITALS — BP 203/93 | Temp 98.0°F | Wt 118.9 lb

## 2016-06-01 DIAGNOSIS — G894 Chronic pain syndrome: Secondary | ICD-10-CM

## 2016-06-01 DIAGNOSIS — Z79891 Long term (current) use of opiate analgesic: Secondary | ICD-10-CM

## 2016-06-01 DIAGNOSIS — N644 Mastodynia: Secondary | ICD-10-CM

## 2016-06-01 DIAGNOSIS — I10 Essential (primary) hypertension: Secondary | ICD-10-CM

## 2016-06-01 DIAGNOSIS — R222 Localized swelling, mass and lump, trunk: Secondary | ICD-10-CM

## 2016-06-01 DIAGNOSIS — Z79899 Other long term (current) drug therapy: Secondary | ICD-10-CM

## 2016-06-01 DIAGNOSIS — F17210 Nicotine dependence, cigarettes, uncomplicated: Secondary | ICD-10-CM

## 2016-06-01 DIAGNOSIS — R0781 Pleurodynia: Secondary | ICD-10-CM

## 2016-06-01 DIAGNOSIS — N6459 Other signs and symptoms in breast: Secondary | ICD-10-CM

## 2016-06-01 DIAGNOSIS — R071 Chest pain on breathing: Secondary | ICD-10-CM

## 2016-06-01 MED ORDER — OXYCODONE-ACETAMINOPHEN 10-325 MG PO TABS: 1.0000 | ORAL_TABLET | Freq: Three times a day (TID) | ORAL | 0 refills | Status: DC | PRN

## 2016-06-01 MED ORDER — METOPROLOL TARTRATE 50 MG PO TABS: 50.0000 mg | ORAL_TABLET | Freq: Two times a day (BID) | ORAL | 0 refills | Status: DC

## 2016-06-01 NOTE — Progress Notes (Signed)
Case discussed with Dr. Randell Patient at the time of the visit. We reviewed the resident's history and exam and pertinent patient test results. I agree with the assessment, diagnosis, and plan of care documented in the resident's note.  It appears she did not stop to get her CXR and right rib series today.

## 2016-06-01 NOTE — Patient Instructions (Addendum)
Please get your xrays done Please get your ultrasound and mammogram done  Take two tablets of metoprolol twice a day until you run out. Your new prescription will be one tablet twice a day.  Please follow up in 2 weeks

## 2016-06-01 NOTE — Assessment & Plan Note (Signed)
Refilled two month supply of Percocet. Patient to follow up with PCP

## 2016-06-01 NOTE — Assessment & Plan Note (Signed)
Chest wall pain may be related to breasts but does describe a pleuritic component to it. Will check CXR and right ribs series.
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
## 2016-06-01 NOTE — Assessment & Plan Note (Signed)
Hypertension while exacerbated by pain is high in the 464V systolic.  Will increase metoprolol tartrate from '25mg'$  BID to '50mg'$  BID. Follow up in 2-3 weeks.

## 2016-06-01 NOTE — Assessment & Plan Note (Signed)
Assessment: Lateral right breast in 10 o'clock region with tenderness to palpation. No palpated nodule though patient reports one. Also has area of firmness in the left breast at 7:00 from areola to outwards. Rubbery 2cm nodule over left ribs that feels like a lipoma. Has not had mammogram for years.  Plan:  US breast Mammogram

## 2016-06-02 ENCOUNTER — Emergency Department (HOSPITAL_COMMUNITY): Payer: Self-pay

## 2016-06-02 ENCOUNTER — Other Ambulatory Visit: Payer: Self-pay

## 2016-06-02 ENCOUNTER — Emergency Department (HOSPITAL_COMMUNITY)
Admission: EM | Admit: 2016-06-02 | Discharge: 2016-06-02 | Disposition: A | Discharge status: Home / Self Care | Payer: Self-pay | Attending: Emergency Medicine | Admitting: Emergency Medicine

## 2016-06-02 ENCOUNTER — Encounter (HOSPITAL_COMMUNITY): Payer: Self-pay

## 2016-06-02 ENCOUNTER — Telehealth: Payer: Self-pay | Admitting: *Deleted

## 2016-06-02 DIAGNOSIS — F17210 Nicotine dependence, cigarettes, uncomplicated: Secondary | ICD-10-CM | POA: Insufficient documentation

## 2016-06-02 DIAGNOSIS — I509 Heart failure, unspecified: Secondary | ICD-10-CM | POA: Insufficient documentation

## 2016-06-02 DIAGNOSIS — R0789 Other chest pain: Secondary | ICD-10-CM | POA: Insufficient documentation

## 2016-06-02 DIAGNOSIS — Z8673 Personal history of transient ischemic attack (TIA), and cerebral infarction without residual deficits: Secondary | ICD-10-CM | POA: Insufficient documentation

## 2016-06-02 DIAGNOSIS — I110 Hypertensive heart disease with heart failure: Secondary | ICD-10-CM | POA: Insufficient documentation

## 2016-06-02 DIAGNOSIS — I2510 Atherosclerotic heart disease of native coronary artery without angina pectoris: Secondary | ICD-10-CM | POA: Insufficient documentation

## 2016-06-02 DIAGNOSIS — Z7982 Long term (current) use of aspirin: Secondary | ICD-10-CM | POA: Insufficient documentation

## 2016-06-02 DIAGNOSIS — I252 Old myocardial infarction: Secondary | ICD-10-CM | POA: Insufficient documentation

## 2016-06-02 DIAGNOSIS — Z79899 Other long term (current) drug therapy: Secondary | ICD-10-CM | POA: Insufficient documentation

## 2016-06-02 LAB — CBC WITH DIFFERENTIAL/PLATELET
Basophils Absolute: 0 10*3/uL (ref 0.0–0.1)
Basophils Relative: 0 %
Eosinophils Absolute: 0.2 10*3/uL (ref 0.0–0.7)
Eosinophils Relative: 2 %
HCT: 42.3 % (ref 36.0–46.0)
Hemoglobin: 14.5 g/dL (ref 12.0–15.0)
Lymphocytes Relative: 33 %
Lymphs Abs: 2.9 10*3/uL (ref 0.7–4.0)
MCH: 32.3 pg (ref 26.0–34.0)
MCHC: 34.3 g/dL (ref 30.0–36.0)
MCV: 94.2 fL (ref 78.0–100.0)
Monocytes Absolute: 0.4 10*3/uL (ref 0.1–1.0)
Monocytes Relative: 4 %
Neutro Abs: 5.3 10*3/uL (ref 1.7–7.7)
Neutrophils Relative %: 61 %
Platelets: 312 10*3/uL (ref 150–400)
RBC: 4.49 MIL/uL (ref 3.87–5.11)
RDW: 13.4 % (ref 11.5–15.5)
WBC: 8.8 10*3/uL (ref 4.0–10.5)

## 2016-06-02 LAB — I-STAT TROPONIN, ED: TROPONIN I, POC: 0 ng/mL (ref 0.00–0.08)

## 2016-06-02 LAB — BASIC METABOLIC PANEL
Anion gap: 9 (ref 5–15)
BUN: 10 mg/dL (ref 6–20)
CO2: 23 mmol/L (ref 22–32)
Calcium: 9.5 mg/dL (ref 8.9–10.3)
Chloride: 106 mmol/L (ref 101–111)
Creatinine, Ser: 0.56 mg/dL (ref 0.44–1.00)
GFR calc Af Amer: >60 mL/min (ref >60)
GFR calc non Af Amer: >60 mL/min (ref >60)
Glucose, Bld: 99 mg/dL (ref 65–99)
Potassium: 4.1 mmol/L (ref 3.5–5.1)
Sodium: 138 mmol/L (ref 135–145)

## 2016-06-02 MED ORDER — IOPAMIDOL (ISOVUE-370) INJECTION 76%
INTRAVENOUS | Status: AC
Start: 2016-06-02 — End: 2016-06-02
  Administered 2016-06-02: 100 mL
  Filled 2016-06-02: qty 100

## 2016-06-02 MED ORDER — MORPHINE SULFATE (PF) 4 MG/ML IV SOLN
4.0000 mg | Freq: Once | INTRAVENOUS | Status: AC
  Administered 2016-06-02: 4 mg via INTRAVENOUS
  Filled 2016-06-02: qty 1

## 2016-06-02 MED ORDER — LISINOPRIL-HYDROCHLOROTHIAZIDE 20-25 MG PO TABS: 1.0000 | ORAL_TABLET | Freq: Every day | ORAL | 0 refills | Status: DC

## 2016-06-02 MED ORDER — OXYCODONE-ACETAMINOPHEN 5-325 MG PO TABS
1.0000 | ORAL_TABLET | Freq: Once | ORAL | Status: AC
  Administered 2016-06-02: 1 via ORAL
  Filled 2016-06-02: qty 1

## 2016-06-02 MED ORDER — HYDROCHLOROTHIAZIDE 25 MG PO TABS
25.0000 mg | ORAL_TABLET | Freq: Every day | ORAL | Status: DC
  Administered 2016-06-02: 25 mg via ORAL
  Filled 2016-06-02: qty 1

## 2016-06-02 NOTE — ED Notes (Signed)
Patient transported to X-ray 

## 2016-06-02 NOTE — ED Notes (Signed)
Patient states she wants to leave.  Does not want further blood test.  PA notified and in speaking with patient

## 2016-06-02 NOTE — ED Notes (Signed)
Pt is stating she is not waiitng and ready to go home.

## 2016-06-02 NOTE — ED Notes (Signed)
Patient given snack of [place] sandwich and applesauce with coke.  Per approval from [facility]

## 2016-06-02 NOTE — Telephone Encounter (Signed)
Call from pt - states she was seen yesterday; having right wall chest pain. Now the pain is mid chest and middle of her back. States pain is worse and "now I'm scared". Wanted to know if test ordered yesterday has been scheduled - told her not yet. At first, she did not want to go to the ED b/c flu epidemic. But agreed to go to be evaluated.

## 2016-06-02 NOTE — ED Notes (Signed)
Pt has lump on L breast and under breast on sternum. Also has sore lymph node in R axillary area.

## 2016-06-02 NOTE — Discharge Instructions (Signed)
Please read attached information. Please share all information with your primary care doctor and oncologist. Please schedule follow up evaluation asap with both. Please use medication as directed, please inform your primary care doctor of recent changes to blood pressure medication. Please return immediately if you experience any new or worsening signs or symptoms.
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
## 2016-06-02 NOTE — ED Triage Notes (Signed)
Patient complains of right sided chest pain with ongoing cough, seen at [facility] yesterday and tests ordered but states doesn't want to wait. Anxious and hyperventilating on arrival, no resp. Distress. Has lung cancer per patient, smoker

## 2016-06-09 ENCOUNTER — Telehealth: Payer: Self-pay | Admitting: Internal Medicine

## 2016-06-09 NOTE — Telephone Encounter (Signed)
APT. REMINDER CALL, LMTCB °

## 2016-06-10 ENCOUNTER — Ambulatory Visit: Payer: No Typology Code available for payment source

## 2016-06-22 ENCOUNTER — Ambulatory Visit: Payer: No Typology Code available for payment source

## 2016-06-29 ENCOUNTER — Telehealth: Payer: Self-pay | Admitting: *Deleted

## 2016-06-29 NOTE — Telephone Encounter (Signed)
[facility] calls and ask if Anilah Komorowski may get her pain med 5 days early instead of the 30 days, he is told no it must be 30days, do you agree?

## 2016-06-29 NOTE — Telephone Encounter (Signed)
I agree on no early refill.

## 2016-07-06 ENCOUNTER — Telehealth: Payer: Self-pay | Admitting: Internal Medicine

## 2016-07-06 NOTE — Telephone Encounter (Signed)
APT. REMINDER CALL, NO ANSWER, NO VOICEMAIL °

## 2016-07-07 ENCOUNTER — Encounter: Payer: Self-pay | Admitting: Internal Medicine

## 2016-07-07 ENCOUNTER — Ambulatory Visit (INDEPENDENT_AMBULATORY_CARE_PROVIDER_SITE_OTHER): Payer: No Typology Code available for payment source | Admitting: Internal Medicine

## 2016-07-07 ENCOUNTER — Encounter (INDEPENDENT_AMBULATORY_CARE_PROVIDER_SITE_OTHER): Payer: Self-pay

## 2016-07-07 VITALS — BP 183/79 | HR 84 | Temp 98.4°F | Wt 119.9 lb

## 2016-07-07 DIAGNOSIS — F17210 Nicotine dependence, cigarettes, uncomplicated: Secondary | ICD-10-CM

## 2016-07-07 DIAGNOSIS — C3432 Malignant neoplasm of lower lobe, left bronchus or lung: Secondary | ICD-10-CM

## 2016-07-07 DIAGNOSIS — Z79891 Long term (current) use of opiate analgesic: Secondary | ICD-10-CM

## 2016-07-07 DIAGNOSIS — Z780 Asymptomatic menopausal state: Secondary | ICD-10-CM

## 2016-07-07 DIAGNOSIS — I10 Essential (primary) hypertension: Secondary | ICD-10-CM

## 2016-07-07 DIAGNOSIS — G8929 Other chronic pain: Secondary | ICD-10-CM

## 2016-07-07 DIAGNOSIS — M5416 Radiculopathy, lumbar region: Secondary | ICD-10-CM

## 2016-07-07 DIAGNOSIS — R87810 Cervical high risk human papillomavirus (HPV) DNA test positive: Secondary | ICD-10-CM

## 2016-07-07 DIAGNOSIS — F1410 Cocaine abuse, uncomplicated: Secondary | ICD-10-CM

## 2016-07-07 DIAGNOSIS — Z1151 Encounter for screening for human papillomavirus (HPV): Secondary | ICD-10-CM

## 2016-07-07 DIAGNOSIS — N649 Disorder of breast, unspecified: Secondary | ICD-10-CM

## 2016-07-07 DIAGNOSIS — F319 Bipolar disorder, unspecified: Secondary | ICD-10-CM

## 2016-07-07 DIAGNOSIS — N644 Mastodynia: Secondary | ICD-10-CM

## 2016-07-07 DIAGNOSIS — Z124 Encounter for screening for malignant neoplasm of cervix: Secondary | ICD-10-CM

## 2016-07-07 MED ORDER — LISINOPRIL-HYDROCHLOROTHIAZIDE 20-12.5 MG PO TABS: 2.0000 | ORAL_TABLET | Freq: Every day | ORAL | 3 refills | Status: DC

## 2016-07-07 MED ORDER — DIVALPROEX SODIUM 250 MG PO DR TAB: 500.0000 mg | DELAYED_RELEASE_TABLET | Freq: Two times a day (BID) | ORAL | 2 refills | Status: DC

## 2016-07-07 MED ORDER — OXYCODONE-ACETAMINOPHEN 10-325 MG PO TABS: 1.0000 | ORAL_TABLET | Freq: Three times a day (TID) | ORAL | 0 refills | Status: DC | PRN

## 2016-07-07 MED ORDER — SPIRONOLACTONE 25 MG PO TABS: 25.0000 mg | ORAL_TABLET | Freq: Every day | ORAL | 1 refills | Status: DC

## 2016-07-07 MED ORDER — GABAPENTIN 300 MG PO CAPS: 600.0000 mg | ORAL_CAPSULE | Freq: Three times a day (TID) | ORAL | 3 refills | Status: DC

## 2016-07-07 NOTE — Patient Instructions (Signed)
I have refilled your oxycodone (given two scripts); you should have one more refill from your visit last month for three total.  I have refilled the blood pressure meds that were close to running out. Please call the clinic if you need refills.   Please call to schedule your mammogram.   I'll see you in three months!
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
## 2016-07-09 LAB — CYTOLOGY - PAP
Diagnosis: NEGATIVE
HPV: NOT DETECTED

## 2016-07-09 MED ORDER — METOPROLOL TARTRATE 25 MG PO TABS: 50.0000 mg | ORAL_TABLET | Freq: Two times a day (BID) | ORAL | 2 refills | Status: DC

## 2016-07-09 MED ORDER — METOPROLOL TARTRATE 25 MG PO TABS: 25.0000 mg | ORAL_TABLET | Freq: Two times a day (BID) | ORAL | 2 refills | Status: DC

## 2016-07-09 NOTE — Assessment & Plan Note (Addendum)
Indication for chronic opioid: Chronic back pain with radiculopathy Medication and dose: Percocet '10mg'$  TID PRN # pills per month: 90 Last UDS date: 01/2015 Pain contract signed (Y/N): yes Date narcotic database last reviewed (include red flags): 07/08/2016 - appropriate  At Feb visit was given 2 month refills to last until through April. Patient endorses stable pain; she takes 3 Percocet 10 mg per day. She denies radiation of pain into legs, numbness tingling, bowel or bladder incontinence, saddle paresthesia.  Plan: --given 2 scripts for Percocet '10mg'$  TID PRN #90 --UDS today --f/u in 3 months  **UDS positive for cocaine metabolite; will address this at follow up visit. This is in violation of pain contract with clinic, and further concomitant use of illicit drugs increases her risk of chronic opioid use. At this point opiate analgesic therapy has a higher risk vs benefit. We will maximize her other non-opioid therapy to treat her chronic radicular pain. This will be addressed at f/u visit.

## 2016-07-09 NOTE — Assessment & Plan Note (Signed)
Refill Depakote '500mg'$  BID

## 2016-07-09 NOTE — Assessment & Plan Note (Addendum)
Metoprolol increased to '50mg'$  BID; Lisinopril-HCTZ 20-12.'5mg'$  BID; amlodipine '10mg'$  daily; spironolactone '25mg'$  daily. Patient states that she has kept her metoprolol at 25 mg twice a day because she thinks that her home her blood pressure is well controlled but all respects except when she is at the doctor's office. She does check her blood pressure increased to bring in her log to next visit. She endorses compliance with her other medicines, but has not taken them yet today. She denies chest pain, shortness of breath, headaches, vision or hearing changes, dizziness, focal weakness.  Plan: --metoprolol '25mg'$  BID, lisinopril-HCTZ 20-12.'5mg'$  BID, amlodipine '10mg'$  daily, spironolactone '25mg'$  daily --patient to bring in her BP log at f/u visit --refills provided

## 2016-07-09 NOTE — Assessment & Plan Note (Signed)
Patient endorses no further breast pain but still with dense tissue at 7 o'clock position on left breast. Exam reveals same, nontender; no associated skin or nipple changes, nipple discharge, axillary lymphadenopathy.  Plan: --patient to schedule previously ordered mammogram + axillary U/S

## 2016-07-09 NOTE — Assessment & Plan Note (Signed)
CTA in ED in Feb 2018 showed possible change in post-op site that could be from progressive scarring vs recurrence of carcinoma; Pet scan was recommended. Patient continues to be without chest pain, denies shortness of breath, hemoptysis, cough, appetite change, fevers, night sweats, or weight loss that would suggest recurrent, underlying malignancy though could be too early.  Plan: --will f/u Dr. Worthy Flank recc's; he is aware of imaging.

## 2016-07-09 NOTE — Assessment & Plan Note (Signed)
Patient is due for Pap smear, her last one was in Oct 2014 which was negative for intraepithelial changes or malignancy, however high risk HPV was noted. Patient was to repeat in one year but never did. She is post-menopausal, denies abnormal vaginal bleeding, discharge, discomfort, lesions, dysuria, hematuria. She is not currently sexually active.  Pelvic exam unremarkable.  Plan: --f/u pap smear +HPV cotesting - normal cytology, negative high risk HPV --repeat co-testing in 3 years per guidelines

## 2016-07-10 LAB — TOXASSURE SELECT,+ANTIDEPR,UR

## 2016-07-13 NOTE — Progress Notes (Signed)
Internal Medicine Clinic Attending  Case discussed with Dr. Jari Favre at the time of the visit.  We reviewed the resident's history and exam and pertinent patient test results.  I agree with the assessment, diagnosis, and plan of care documented in the resident's note.  Urine tox screen returned positive for a metabolite of cocaine, which is a violation of her controlled medication contract. This will need to be addressed at the next visit. Her chronic pain generator is radiculopathy due to mod/severe lumbar facet arthropathy. Given presence of cocaine, the risks of continued opioid therapy likely outweighs the benefits in this case. I would recommend maximizing non-opioid management as an alternative.

## 2016-08-17 ENCOUNTER — Telehealth: Payer: Self-pay | Admitting: Internal Medicine

## 2016-08-17 NOTE — Telephone Encounter (Signed)
CALLED PATIENT LMTCB, TIME TO RENEW GCCN CARD
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
## 2016-09-01 ENCOUNTER — Other Ambulatory Visit: Payer: Self-pay

## 2016-09-08 ENCOUNTER — Encounter: Payer: Self-pay | Admitting: Internal Medicine

## 2016-09-15 ENCOUNTER — Ambulatory Visit: Payer: No Typology Code available for payment source
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
## 2016-09-21 ENCOUNTER — Other Ambulatory Visit (HOSPITAL_COMMUNITY): Payer: Self-pay | Admitting: *Deleted

## 2016-09-21 DIAGNOSIS — N6320 Unspecified lump in the left breast, unspecified quadrant: Secondary | ICD-10-CM

## 2016-09-23 ENCOUNTER — Other Ambulatory Visit (HOSPITAL_COMMUNITY): Payer: Self-pay | Admitting: *Deleted

## 2016-09-23 DIAGNOSIS — N6320 Unspecified lump in the left breast, unspecified quadrant: Secondary | ICD-10-CM

## 2016-09-24 ENCOUNTER — Encounter (HOSPITAL_COMMUNITY): Payer: Self-pay

## 2016-09-24 ENCOUNTER — Ambulatory Visit
Admission: RE | Admit: 2016-09-24 | Discharge: 2016-09-24 | Disposition: A | Discharge status: Home / Self Care | Payer: No Typology Code available for payment source | Source: Ambulatory Visit | Attending: Obstetrics and Gynecology | Admitting: Obstetrics and Gynecology

## 2016-09-24 ENCOUNTER — Other Ambulatory Visit: Payer: No Typology Code available for payment source

## 2016-09-24 ENCOUNTER — Ambulatory Visit (HOSPITAL_COMMUNITY)
Admission: RE | Admit: 2016-09-24 | Discharge: 2016-09-24 | Disposition: A | Discharge status: Home / Self Care | Payer: No Typology Code available for payment source | Source: Ambulatory Visit | Attending: Obstetrics and Gynecology | Admitting: Obstetrics and Gynecology

## 2016-09-24 VITALS — BP 134/82 | Ht 63.0 in | Wt 114.4 lb

## 2016-09-24 DIAGNOSIS — C3432 Malignant neoplasm of lower lobe, left bronchus or lung: Secondary | ICD-10-CM | POA: Insufficient documentation

## 2016-09-24 DIAGNOSIS — Z1239 Encounter for other screening for malignant neoplasm of breast: Secondary | ICD-10-CM

## 2016-09-24 DIAGNOSIS — N6320 Unspecified lump in the left breast, unspecified quadrant: Secondary | ICD-10-CM

## 2016-09-24 DIAGNOSIS — N6324 Unspecified lump in the left breast, lower inner quadrant: Secondary | ICD-10-CM

## 2016-09-24 DIAGNOSIS — J439 Emphysema, unspecified: Secondary | ICD-10-CM | POA: Insufficient documentation

## 2016-09-24 DIAGNOSIS — I700 Atherosclerosis of aorta: Secondary | ICD-10-CM | POA: Insufficient documentation

## 2016-09-24 NOTE — Patient Instructions (Addendum)
Explained breast self awareness with [name]. Patient did not need a Pap smear today due to last Pap smear was 07/07/2016. Let her know that her next Pap smear will be due in one year because her recent history of a postive HPV Pap smear. Referred patient to the [place] for diagnostic mammogram and possible left breast ultrasound. Appointment scheduled for Thursday, September 24, 2016 at 1530. Discussed smoking cessation with patient. Referred patient to the [facility] Quitline and gave resources to the free smoking cessation classes at [facility].  Glean Salen [place] verbalized understanding.  Karrisa Didio, [place]vil [place], RN 12:50 PM

## 2016-09-24 NOTE — Progress Notes (Signed)
Complaints of two left breast lumps x 5 months.  Pap Smear: Pap smear not completed today. Last Pap smear was 07/07/2016 at [facility] and normal with negative HPV. Patient has a history of a normal Pap smear with positive HPV 02/06/2013. Per patient the positive HPV is the only abnormal result she has had. The above two Pap smears are in EPIC.   Physical exam: Breasts Breasts symmetrical. No skin abnormalities bilateral breasts. No nipple retraction bilateral breasts. No nipple discharge bilateral breasts. No lymphadenopathy. No lumps palpated right breast. Palpated a lump within the left breast at 7 o'clock 4 cm from the nipple. No complaints of pain or tenderness on exam. Referred patient to the [place] for diagnostic mammogram and possible left breast ultrasound. Appointment scheduled for Thursday, September 24, 2016 at 1530.        Pelvic/Bimanual No Pap smear completed today since last Pap smear was 07/07/2016. Pap smear not indicated per BCCCP guidelines.   Smoking History: Patient is a current smoker. Discussed smoking cessation with patient. Referred patient to the [facility] Quitline and gave resources to the free smoking cessation classes at [facility].  Patient Navigation: Patient education provided. Access to services provided for patient through [place] program.   Colorectal Cancer Screening: Per patient has never had a colonoscopy completed. No complaints today. FIT test given to patient to complete and return to BCCCP.

## 2016-09-24 NOTE — Addendum Note (Signed)
Encounter addended by: Loletta Parish, RN on: 09/24/2016  1:45 PM<BR>    Actions taken: Sign clinical note

## 2016-09-25 ENCOUNTER — Encounter (HOSPITAL_COMMUNITY): Payer: Self-pay | Admitting: *Deleted

## 2016-09-25 ENCOUNTER — Other Ambulatory Visit (HOSPITAL_BASED_OUTPATIENT_CLINIC_OR_DEPARTMENT_OTHER): Payer: Self-pay

## 2016-09-25 ENCOUNTER — Ambulatory Visit (HOSPITAL_COMMUNITY)
Admission: RE | Admit: 2016-09-25 | Discharge: 2016-09-25 | Disposition: A | Discharge status: Home / Self Care | Payer: No Typology Code available for payment source | Source: Ambulatory Visit | Attending: Internal Medicine | Admitting: Internal Medicine

## 2016-09-25 ENCOUNTER — Encounter (HOSPITAL_COMMUNITY): Payer: Self-pay

## 2016-09-25 DIAGNOSIS — C3432 Malignant neoplasm of lower lobe, left bronchus or lung: Secondary | ICD-10-CM

## 2016-09-25 LAB — COMPREHENSIVE METABOLIC PANEL
ALBUMIN: 3.9 g/dL (ref 3.5–5.0)
ALK PHOS: 110 U/L (ref 40–150)
ALT: 14 U/L (ref 0–55)
ANION GAP: 8 meq/L (ref 3–11)
AST: 17 U/L (ref 5–34)
BUN: 17.6 mg/dL (ref 7.0–26.0)
CALCIUM: 10.4 mg/dL (ref 8.4–10.4)
CHLORIDE: 107 meq/L (ref 98–109)
CO2: 27 mEq/L (ref 22–29)
Creatinine: 0.8 mg/dL (ref 0.6–1.1)
EGFR: 78 mL/min/{1.73_m2} — AB (ref >90)
Glucose: 89 mg/dl (ref 70–140)
Sodium: 142 mEq/L (ref 136–145)
Total Bilirubin: <0.22 mg/dL (ref 0.20–1.20)
Total Protein: 8.4 g/dL — ABNORMAL HIGH (ref 6.4–8.3)

## 2016-09-25 LAB — CBC WITH DIFFERENTIAL/PLATELET
BASO%: 1.2 % (ref 0.0–2.0)
Basophils Absolute: 0.1 10*3/uL (ref 0.0–0.1)
EOS ABS: 0.2 10*3/uL (ref 0.0–0.5)
EOS%: 2 % (ref 0.0–7.0)
HCT: 43.2 % (ref 34.8–46.6)
HEMOGLOBIN: 14.4 g/dL (ref 11.6–15.9)
LYMPH#: 2.3 10*3/uL (ref 0.9–3.3)
LYMPH%: 30.1 % (ref 14.0–49.7)
MCH: 32 pg (ref 25.1–34.0)
MCHC: 33.4 g/dL (ref 31.5–36.0)
MCV: 95.8 fL (ref 79.5–101.0)
MONO#: 0.6 10*3/uL (ref 0.1–0.9)
MONO%: 7.3 % (ref 0.0–14.0)
NEUT%: 59.4 % (ref 38.4–76.8)
NEUTROS ABS: 4.5 10*3/uL (ref 1.5–6.5)
PLATELETS: 328 10*3/uL (ref 145–400)
RBC: 4.5 10*6/uL (ref 3.70–5.45)
RDW: 14.1 % (ref 11.2–14.5)
WBC: 7.6 10*3/uL (ref 3.9–10.3)

## 2016-09-25 MED ORDER — IOPAMIDOL (ISOVUE-300) INJECTION 61%
INTRAVENOUS | Status: AC
  Administered 2016-09-25: 75 mL via INTRAVENOUS
  Filled 2016-09-25: qty 75
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
## 2016-09-30 ENCOUNTER — Other Ambulatory Visit: Payer: Self-pay

## 2016-09-30 ENCOUNTER — Encounter: Payer: Self-pay | Admitting: *Deleted

## 2016-09-30 ENCOUNTER — Encounter: Payer: Self-pay | Admitting: Internal Medicine

## 2016-09-30 ENCOUNTER — Ambulatory Visit (HOSPITAL_BASED_OUTPATIENT_CLINIC_OR_DEPARTMENT_OTHER): Payer: Self-pay | Admitting: Internal Medicine

## 2016-09-30 VITALS — BP 149/73 | HR 91 | Temp 97.9°F | Resp 20 | Ht 63.0 in | Wt 117.7 lb

## 2016-09-30 DIAGNOSIS — Z79891 Long term (current) use of opiate analgesic: Secondary | ICD-10-CM

## 2016-09-30 DIAGNOSIS — Z85118 Personal history of other malignant neoplasm of bronchus and lung: Secondary | ICD-10-CM

## 2016-09-30 DIAGNOSIS — C3432 Malignant neoplasm of lower lobe, left bronchus or lung: Secondary | ICD-10-CM
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
## 2016-09-30 NOTE — Telephone Encounter (Signed)
oxyCODONE-acetaminophen (PERCOCET) 10-325 MG tablet, refill request.

## 2016-09-30 NOTE — Progress Notes (Signed)
Oncology Nurse Navigator Documentation  Oncology Nurse Navigator Flowsheets 09/30/2016  Navigator Location [facility]  Navigator Encounter Type Clinic/MDC/spoke with patient and friend at [facility].  Smoking cessation education given   Patient Visit Type MedOnc  Treatment Phase Post-Tx Follow-up  Barriers/Navigation Needs Education  Education Smoking cessation  Interventions Education  Education Method Verbal  Acuity Level 1  Time Spent with Patient 15

## 2016-10-01 ENCOUNTER — Other Ambulatory Visit: Payer: No Typology Code available for payment source

## 2016-10-01 DIAGNOSIS — Z79891 Long term (current) use of opiate analgesic: Secondary | ICD-10-CM

## 2016-10-01 MED ORDER — OXYCODONE-ACETAMINOPHEN 10-325 MG PO TABS: 1.0000 | ORAL_TABLET | Freq: Three times a day (TID) | ORAL | 0 refills | Status: DC | PRN

## 2016-10-01 NOTE — Telephone Encounter (Signed)
Pt provided the least amt of urine acceptable for uds, 15cc

## 2016-10-01 NOTE — Telephone Encounter (Signed)
Spoke w/ dr Jari Favre, she will bring script down, pt needs tox screen also, pt informed she can pick up script about 4pm, did not inform she would be giving urine sample

## 2016-10-01 NOTE — Assessment & Plan Note (Signed)
Brenda [name] is on chronic opioid therapy for chronic pain. The date of the controlled substances contract is referenced in the [place] and / or the overview. Date of pain contract was 12/2015. As part of the treatment plan, the [place] controlled substance database is checked at least twice yearly and the database results are appropriate. I have last reviewed the results on 10/01/2016.   The last UDS was on 06/2016 and results are not as expected. Patient needs at least a yearly UDS.   The patient is on oxycodone/acetaminophen (Percocet, Tylox) strength 10, 90 per 30 days. Adjunctive treatment includes NSAID's and Muscle relaxants. This regimen allows Brenda [name] to function and does not cause excessive sedation or other side effects.  The benefits of continuing opioid therapy do not outweigh the risks because of illicit drug use".  Interventions today include: UDS One refill Will discuss inappropriate UDS result at f/u visit with me on 6/27 and plan for maximizing non-opioid therapy like cymbalta, referral back to ortho/sports med, NSAIDs while working on titrating off of opioid therapy.

## 2016-10-01 NOTE — Telephone Encounter (Signed)
Patient with chronic radicular back pain currently on Percocet 10mg  TID PRN; she is calling to request a refill.   [place] controlled substances database was checked today and appropriate.  At last PCP visit, [facility] was cocaine positive.  My next visit with her is on June 27th.   I will give her one script for Percocet 10mg  TID PRN and obtain another UDS at pickup. Will discuss inappropriate UDS at f/u visit with me and plan for titration off of opioids while maximizing non-opioid therapy.

## 2016-10-01 NOTE — Telephone Encounter (Signed)
Would like to know if pain med is ready for pick up. Please call pt back.

## 2016-10-01 NOTE — Telephone Encounter (Signed)
Also noted that pt states she last took pain med mon or tues of this week that would have been 6/18 or 6/19

## 2016-10-02 ENCOUNTER — Telehealth: Payer: Self-pay | Admitting: Internal Medicine

## 2016-10-02 NOTE — Telephone Encounter (Signed)
Scheduled appt per 6/20 los - lab and f/u in 6 months with ct - [facility] to contact patient with ct schedule- reminder letter sent in the mail.
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
## 2016-10-07 ENCOUNTER — Encounter: Payer: Self-pay | Admitting: Internal Medicine

## 2016-10-07 ENCOUNTER — Ambulatory Visit (INDEPENDENT_AMBULATORY_CARE_PROVIDER_SITE_OTHER): Payer: Self-pay | Admitting: Internal Medicine

## 2016-10-07 ENCOUNTER — Ambulatory Visit: Payer: No Typology Code available for payment source

## 2016-10-07 VITALS — BP 192/72 | HR 64 | Temp 98.0°F | Ht 63.0 in | Wt 117.4 lb

## 2016-10-07 DIAGNOSIS — K219 Gastro-esophageal reflux disease without esophagitis: Secondary | ICD-10-CM

## 2016-10-07 DIAGNOSIS — Z85118 Personal history of other malignant neoplasm of bronchus and lung: Secondary | ICD-10-CM

## 2016-10-07 DIAGNOSIS — F17210 Nicotine dependence, cigarettes, uncomplicated: Secondary | ICD-10-CM

## 2016-10-07 DIAGNOSIS — I10 Essential (primary) hypertension: Secondary | ICD-10-CM

## 2016-10-07 DIAGNOSIS — Z902 Acquired absence of lung [part of]: Secondary | ICD-10-CM

## 2016-10-07 DIAGNOSIS — F1910 Other psychoactive substance abuse, uncomplicated: Secondary | ICD-10-CM

## 2016-10-07 DIAGNOSIS — J439 Emphysema, unspecified: Secondary | ICD-10-CM

## 2016-10-07 DIAGNOSIS — N644 Mastodynia: Secondary | ICD-10-CM

## 2016-10-07 DIAGNOSIS — M549 Dorsalgia, unspecified: Secondary | ICD-10-CM

## 2016-10-07 DIAGNOSIS — G8929 Other chronic pain: Secondary | ICD-10-CM

## 2016-10-07 DIAGNOSIS — Z79899 Other long term (current) drug therapy: Secondary | ICD-10-CM

## 2016-10-07 LAB — TOXASSURE SELECT,+ANTIDEPR,UR

## 2016-10-07 NOTE — Patient Instructions (Signed)
Please make an appointment with the Suboxone clinic in our office for next Tuesday as an option for your pain control and withdrawal from opioids.  Please see me in about 2 months.  I will write those letters for you stating your diagnoses and in support of an emotional support animal. If you come in next Tuesday, you can pick them up then, or come in earlier.

## 2016-10-08 ENCOUNTER — Encounter: Payer: Self-pay | Admitting: Internal Medicine

## 2016-10-08 NOTE — Assessment & Plan Note (Signed)
Patient has had a pain contract with me for oxycodone-apap 10-338m TID PRN. A UDS was obtained in March which showed cocaine metabolites. UDS one week ago showed cocaine metabolites, hydrocodone, buprenorphine, alprazolam, and did not show oxycodone. When presented with this information today, patient denies use of any controlled substances or illiict drug use other than her oxycodone-apap.  Discussed with patient that our contract is broken due to clinic policy and high risk of serious injury or death with polysubstance use. Dr. VEvette Doffingmet with patient and offered a consultation for suboxone therapy vs methadone. [facility]   Plan: --patient [facility]--patient to make appointment with Dr. MDaryll Drownnext week for consultation for possibly starting suboxone therapy.

## 2016-10-08 NOTE — Assessment & Plan Note (Signed)
Uncontrolled. BP 192/72 today and asymptomatic. Though at previous visits she was adamant about compliance with 5-agent therapy, today she states she has only been able to afford lisinopril-HCTZ 40-25 daily.   Plan: --med list updated --continue lisinopril-HCTZ 40-25mg  daily --patient working on getting disability insurance. Will see if there are other options as patient cannot afford $4 copay for additional agent --UDS +cocaine; advised patient to abstain though she denies ever using it.

## 2016-10-08 NOTE — Assessment & Plan Note (Signed)
Patient has not had further pain. Mammogram and U/S unremarkable.

## 2016-10-09 ENCOUNTER — Encounter: Payer: Self-pay | Admitting: Student in an Organized Health Care Education/Training Program

## 2016-10-09 NOTE — Progress Notes (Signed)
Internal Medicine Clinic Attending  I saw and evaluated the patient.  I personally confirmed the key portions of the history and exam documented by Dr. Jari Favre and I reviewed pertinent patient test results.  The assessment, diagnosis, and plan were formulated together and I agree with the documentation in the resident's note.  This is a difficult case of a patient with chronic back pain since at least 2008 due to multi-level facet arthritis and nerve root impingement as well as chronic myofascial pain syndrome. She has received opioids chronically off and on through the [facility], pain clinics, and PM&R. [facility] has prescribed the most recent course of oxycodone under a pain contract since February 2017. On 10/01/2016 urine toxicology screen was wildly inappropriate, with multiple illicit substances that were not prescribed to her including benzos, cocaine's metabolite, codeine products, hydrocodone, and buprenorphine. The oxycodone she was prescribed was not present. We showed the patient this result and she denies using any other substances, says we must have switched the urine samples by mistake. I pointed out that the March screen was also positive for cociane's metaoblite. She says "I will be honest with you, I had a problem with cocaine many years ago, but now I don't touch it." She spent a lot of time telling us how bad the drug problem is on the street, that she sees drug addicts everywhere, and she doesn't want to turn out like one of them. She insists that she needs further opioids for her chronic back pain. I think she was implying that if we do not prescribe opioids she will be forced to use illicit opioids. She does have a fear of opioid withdrawal, but mostly she describes being afraid of worsening pain sensations.   I advised her that based on these tests, it would be poor medical care, unsafe, and against [facility] policy to prescribe any further opioids. She reports that she has about a 3 week supply of  oxycodone remaining at her house that she will use for now. I offered her a consultation with our Suboxone clinic. I am worried that she may not be a good candidate for long term suboxone. She is in denial about her substance use disorder, which will make it hard to have shared goals. She will certainly need counseling and psychiatric services as a condition of suboxone treatment. I advised that suboxone has limited benefit on pain generators, and if she has significant chronic pain, methadone would be a better option for her given its better analgesia. She became very tearful at this point, and said she will never go on methadone, she is afraid of it. We left our visit here, with her having a few days to decide what she wants to do. She will follow up next week with [place] in the suboxone clinic and we can re-assess. She quickly walked out of the clinic at that point, she was upset.

## 2016-10-13 ENCOUNTER — Ambulatory Visit (INDEPENDENT_AMBULATORY_CARE_PROVIDER_SITE_OTHER): Payer: Self-pay | Admitting: Internal Medicine

## 2016-10-13 VITALS — BP 180/76 | HR 90 | Temp 97.9°F | Resp 18 | Ht 63.0 in | Wt 115.0 lb

## 2016-10-13 DIAGNOSIS — Z79899 Other long term (current) drug therapy: Secondary | ICD-10-CM

## 2016-10-13 DIAGNOSIS — G8929 Other chronic pain: Secondary | ICD-10-CM

## 2016-10-13 DIAGNOSIS — F1120 Opioid dependence, uncomplicated: Secondary | ICD-10-CM

## 2016-10-13 DIAGNOSIS — F17290 Nicotine dependence, other tobacco product, uncomplicated: Secondary | ICD-10-CM

## 2016-10-13 DIAGNOSIS — F17210 Nicotine dependence, cigarettes, uncomplicated: Secondary | ICD-10-CM

## 2016-10-13 DIAGNOSIS — Z79891 Long term (current) use of opiate analgesic: Secondary | ICD-10-CM

## 2016-10-13 DIAGNOSIS — Z7982 Long term (current) use of aspirin: Secondary | ICD-10-CM

## 2016-10-13 MED ORDER — BUPRENORPHINE HCL-NALOXONE HCL 8-2 MG SL FILM: 1.0000 | ORAL_FILM | Freq: Two times a day (BID) | SUBLINGUAL | 0 refills | Status: DC

## 2016-10-13 MED FILL — SUBOXONE 8 MG-2 MG SL FILM: 8-2 | 7 days supply | Qty: 14 | Fill #0

## 2016-10-13 NOTE — Patient Instructions (Signed)
Instruction for starting buprenorphine-naloxone (Suboxone) at home  You should not mix buprenorphine-naloxone with other drugs especially large amounts of alcohol or benzodiazepines (Valium, Klonopin, Xanax, Ativan). If you have taken any of these medication, please tell your healthcare team and do not take buprenorphine-naloxone.   You must wait until you are feeling signs of withdrawal from opiates (heroin, pain pills) before you take buprenorphine-naloxone.  If you do not wait long enough the medication will make you sicker.  If you do take it too soon and get sicker then wait until later when you feel signs of withdrawal listed below and then try again.   Signs that you are withdrawing: ? Anxiety, restlessness, can't sit still ? Aches ? Nausea or sick to your stomach ? Goose-bumps ? Racing heart   You should have ALL of these symptoms before you start taking your first dose of buprenorphine-naloxone. If you are not sure call your healthcare team.    When it's time to take your first dose 1. Split your pill or film in half 2. Make sure your mouth is empty of everything (no candy/gum/etc) 3. Sit or stand, but do not lie down 4. Swallow a sip of water to wet your mouth  5. Put the half of the tablet or film under your tongue. Do not suck or swallow it. It must stay there until it is completely dissolved. Try to not even swallow your spit during this time. Anything that you swallow will not make you feel better.   In 20 minutes: You should start feeling a little better. If you feel worse then you started too early so you would wait a few hours and then try again later.    In one hour: You can take the other half of the pill or film the same way you took the first one.   In 2 hours: if you are still feeling symptoms of withdrawal listed above you can take another half a pill or film. You can repeat this if needed until you take a total of 2 pills or 2 films (16mg ). You may need less than  this to control your symptoms.  You should adjust your dose so that you are taking one and half or two pills or films per day (12-16mg  per day). At this dose you should have cut down on cravings and help with any withdrawal symptoms.   The next day:  In the morning you can take the same amount you took yesterday all at one time in the morning.  Expect a call from your team to see how you are doing.   If you have any questions or concerns at any time call your healthcare team.  Clinic Number:  [place]: [phone] After Hours Number: [phone] - - Leave your number and expect a call back from a physician.
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
## 2016-10-19 LAB — TOXASSURE SELECT,+ANTIDEPR,UR

## 2016-10-20 ENCOUNTER — Ambulatory Visit (INDEPENDENT_AMBULATORY_CARE_PROVIDER_SITE_OTHER): Payer: Self-pay | Admitting: Student in an Organized Health Care Education/Training Program

## 2016-10-20 VITALS — BP 173/78 | HR 82 | Temp 98.3°F | Resp 20 | Wt 115.5 lb

## 2016-10-20 DIAGNOSIS — G8929 Other chronic pain: Secondary | ICD-10-CM

## 2016-10-20 DIAGNOSIS — F17290 Nicotine dependence, other tobacco product, uncomplicated: Secondary | ICD-10-CM

## 2016-10-20 DIAGNOSIS — F1120 Opioid dependence, uncomplicated: Secondary | ICD-10-CM

## 2016-10-20 DIAGNOSIS — M47892 Other spondylosis, cervical region: Secondary | ICD-10-CM

## 2016-10-20 DIAGNOSIS — F17210 Nicotine dependence, cigarettes, uncomplicated: Secondary | ICD-10-CM

## 2016-10-20 DIAGNOSIS — Z79899 Other long term (current) drug therapy: Secondary | ICD-10-CM

## 2016-10-20 DIAGNOSIS — F329 Major depressive disorder, single episode, unspecified: Secondary | ICD-10-CM

## 2016-10-20 DIAGNOSIS — M47896 Other spondylosis, lumbar region: Secondary | ICD-10-CM

## 2016-10-20 DIAGNOSIS — F32A Depression, unspecified: Secondary | ICD-10-CM

## 2016-10-20 DIAGNOSIS — Z79891 Long term (current) use of opiate analgesic: Secondary | ICD-10-CM

## 2016-10-20 DIAGNOSIS — F419 Anxiety disorder, unspecified: Secondary | ICD-10-CM

## 2016-10-20 DIAGNOSIS — F1990 Other psychoactive substance use, unspecified, uncomplicated: Secondary | ICD-10-CM

## 2016-10-20 MED ORDER — BUPRENORPHINE HCL-NALOXONE HCL 8-2 MG SL FILM
1.0000 | ORAL_FILM | Freq: Two times a day (BID) | SUBLINGUAL | 0 refills | Status: DC
Start: 2016-10-20 — End: 2016-10-27

## 2016-10-20 MED ORDER — CLONAZEPAM 0.5 MG PO TABS: 0.5000 mg | ORAL_TABLET | Freq: Two times a day (BID) | ORAL | 0 refills | Status: DC | PRN

## 2016-10-20 MED FILL — clonazePAM 0.5 MG TABS: 0.5 | 7 days supply | Qty: 14 | Fill #0

## 2016-10-20 MED FILL — SUBOXONE 8 MG-2 MG SL FILM: 8-2 | 7 days supply | Qty: 14 | Fill #0

## 2016-10-20 NOTE — Patient Instructions (Signed)
1. Take your suboxone as prescribed.   2. We will try a short supply of klonipin to help your anxiety while we are making this transition. This is not a long term medication.   3. Come back next Tuesday to follow up. We expect you to not use other illicit substances over the next week.

## 2016-10-20 NOTE — Assessment & Plan Note (Signed)
Patient with severe and poorly controlled anxiety today. This is been worsened by the recent transition from oxycodone onto Suboxone. She has significant amount of financial distress and poor social support. She is unable to afford sertraline, has been using Cymbalta intermittently given to her by a friend. I empathized with her that the chronic anxiety is currently much worse than usual. We talked about different therapeutic options. Plan is to use a short course of clonazepam 0.5 mg twice a day for 1 week. I was very clear with Brenda [name] that this is not a long-term prescription. It is to be used only over the next few weeks and at very low levels to help with this transition off of oxycodone onto Suboxone. The patient understands and is agreeable to this plan. I set as a requisite for this treatments that her next urine sample will need to be clean of other substances including cocaine and illicitly obtained benzos. Patient understands and set this as a goal for herself.

## 2016-10-20 NOTE — Assessment & Plan Note (Signed)
Patient has moderate opioid use disorder stemming from chronic pain of her neck and lower back which is not amenable to surgical correction. She's been on chronic opioids on and off for many years and had inapropriate UDS tests in the last few months. She was initiated on Suboxone on 10/13/16. She reports that the medication was successful in preventing withdrawal. She says that she still feels a little shaky at night, thinks that she's having some mild withdrawal. She's having financial difficulties affording the medication. Our clinic gave her a coupon last week, the 14 strips cost her $80 out of pocket. She denies using any other opioids. She does report that her back pain is worse since coming off the oxycodone. We talked about the limited analgesia that she can expect from Suboxone, we talked again about methadone as a potential option for the future, however the patient says that she can't afford the $13 a day methadone clinics charge. Plan is to continue with Suboxone, 60 mg daily, #14 films prescribed today for a one week supply. UDS obtained today. I reviewed the database which was appropriate.

## 2016-10-20 NOTE — Progress Notes (Signed)
   10/20/2016  Brenda [name] presents for follow up of opioid use disorder I have reviewed the prior induction visit, follow up visits, and telephone encounters relevant to opiate use disorder (OUD) treatment.   Current daily dose: 16 mg daily  Date of Induction: 10/13/16  Current follow up interval, in weeks: 1  The patient has been adherent to the buprenorphine for OUD contract:   Last UDS Result: Positive for THC and Alprazolam.   HPI:   56 year old woman here for follow-up of a moderate polysubstance use disorder including opioid use disorder. Patient has a long history of chronic pain due to spondylosis of the cervical and lumbar spines. She tells me that her last evaluations at [facility] were around 2017 and they were unable to offer her surgery for her multilevel arthritic disease. She's not been able to follow back up with their clinic because of the cost of the co-pays. She is on chronic narcotics up until June 2018 when her primary care physician found that her urine drug screens were extremely appropriate with multiple illicit substances. The PCP decided that Brenda [name] would not be a candidate for continued short acting narcotics. The patient started to experience withdrawal, which was treated by initiating Suboxone last week with Dr. Daryll Drown. She reports being able to obtain the medicine, reports using it correctly. She says that has been successful in treating her withdrawal. During the day she feels relatively normal with no nausea, vomiting, diarrhea, fevers, palpitations, or other signs of withdrawal. However she does report that her back pain is now much worse. She says that the pain radiates down her right leg to her toe. She also reports that her right hand becomes numb. Pain is in her neck and lower back. Several times she said that her pain levels have been "retarded". She reports that this has made her anxiety much worse this week. She feels extremely jittery and anxious, she reports  self-medicating by using marijuana, reports using cocaine once last week. She denies using any illicitly obtained benzos. And she'll hardship, says that she has electric bill that she cannot pay and they're going to turn her lights off. She reports not being able to obtain disability, which she equated to her life being over.   Exam:   Vitals:   10/20/16 1004  BP: (!) 173/78  Pulse: 82  Resp: 20  Temp: 98.3 F (36.8 C)  TempSrc: Oral  SpO2: 100%  Weight: 115 lb 8 oz (52.4 kg)   Gen: Anxious and tearful, frail looking woman CV: RRR, no murmurs Lungs: clear throughout, no wheezing Ext: Warm, thin, no edema Neuro: Conversational, very slow gait that appears over dramatized, normal strength in all extremities.   Assessment/Plan:  See Problem Based Charting in the Encounters Tab  Medication Management - Patient is doing well on current daily dose, which is continued today. UDS ordered.    Follow up interval: every 1 week   Axel Filler, MD  10/20/2016  12:40 PM

## 2016-10-24 LAB — TOXASSURE SELECT,+ANTIDEPR,UR

## 2016-10-26 ENCOUNTER — Ambulatory Visit (HOSPITAL_COMMUNITY): Payer: No Typology Code available for payment source

## 2016-10-27 ENCOUNTER — Ambulatory Visit (INDEPENDENT_AMBULATORY_CARE_PROVIDER_SITE_OTHER): Payer: Self-pay | Admitting: Internal Medicine

## 2016-10-27 VITALS — BP 155/85 | HR 62 | Temp 98.5°F | Resp 20 | Wt 113.5 lb

## 2016-10-27 DIAGNOSIS — Z79899 Other long term (current) drug therapy: Secondary | ICD-10-CM

## 2016-10-27 DIAGNOSIS — Z79891 Long term (current) use of opiate analgesic: Secondary | ICD-10-CM

## 2016-10-27 DIAGNOSIS — M47892 Other spondylosis, cervical region: Secondary | ICD-10-CM

## 2016-10-27 DIAGNOSIS — G8929 Other chronic pain: Secondary | ICD-10-CM

## 2016-10-27 DIAGNOSIS — M47896 Other spondylosis, lumbar region: Secondary | ICD-10-CM

## 2016-10-27 DIAGNOSIS — F329 Major depressive disorder, single episode, unspecified: Secondary | ICD-10-CM

## 2016-10-27 DIAGNOSIS — F1990 Other psychoactive substance use, unspecified, uncomplicated: Secondary | ICD-10-CM

## 2016-10-27 DIAGNOSIS — F17210 Nicotine dependence, cigarettes, uncomplicated: Secondary | ICD-10-CM

## 2016-10-27 DIAGNOSIS — F1120 Opioid dependence, uncomplicated: Secondary | ICD-10-CM

## 2016-10-27 DIAGNOSIS — F419 Anxiety disorder, unspecified: Secondary | ICD-10-CM

## 2016-10-27 DIAGNOSIS — F17290 Nicotine dependence, other tobacco product, uncomplicated: Secondary | ICD-10-CM

## 2016-10-27 MED ORDER — BUPRENORPHINE HCL-NALOXONE HCL 8-2 MG SL FILM: ORAL_FILM | SUBLINGUAL | 0 refills | Status: DC

## 2016-10-27 MED ORDER — CLONAZEPAM 0.5 MG PO TABS: 0.5000 mg | ORAL_TABLET | Freq: Two times a day (BID) | ORAL | 0 refills | Status: DC | PRN

## 2016-10-27 MED FILL — clonazePAM 0.5 MG TABS: 0.5 | 7 days supply | Qty: 14 | Fill #0

## 2016-10-27 MED FILL — SUBOXONE 8 MG-2 MG SL FILM: 8-2 | 7 days supply | Qty: 18 | Fill #0

## 2016-10-27 NOTE — Progress Notes (Signed)
   10/27/2016  Brenda [name] presents for follow up of opioid use disorder.  I have reviewed the prior induction visit, follow up visits, and telephone encounters relevant to opiate use disorder (OUD) treatment.   Current daily dose: 16mg   Date of Induction: 10/13/16.  The patient has been adherent to the buprenorphine for OUD contract:   Last UDS Result: + for cocaine, THC, morphine and buprenorphine.  HPI:   Brenda [name] is a 56yo woman here for follow up moderate polysubstance use disorder including OUD.  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  She has followed with surgery consultants in the past, but has not recently been able to afford it due to the co pay.    Brenda [name] reports today that her pain is still uncontrolled.  She notes that she is taking her suboxone early in the morning around 6am and then at around 1pm in the afternoon.  She feels that her symptoms of withdrawal are then worse at night and she has problems sleeping.  She feels that she would do better with 3 films per day.  She notes continued symptoms of sweating off an on, nausea, poor sleep, stomach cramps and jerking.  We discussed her UDS and she is adamant that she has not taken other narcotics or cocaine in the past week and that her urine is "clean."  She feels she will eventually need surgery for her neck pain, but that she cannot afford it right now.    We discussed her anxiety at length.  She notes that the clonazepam is helping very much with her anxiety.  We discussed beginning a taper to 10 tablets, but she was very resistant to this.  She noted that her oncologist called her this week and asked her to come in for blood work and a CT scan of the chest this week (at first she noted it was tomorrow and then Friday).  She feels she is too anxious to wean down off of her clonazepam today.  We discussed that this medication is only intended to be prescribed for a few weeks, not  months and certainly not indefinitely.  She should anticipate a taper of this medication and we will help her to get on a longer lasting medication such as cymbalta in the future.    Exam:   Vitals:   10/27/16 0927  BP: (!) 155/85  Pulse: 62  Resp: 20  Temp: 98.5 F (36.9 C)  TempSrc: Oral  SpO2: 100%  Weight: 113 lb 8 oz (51.5 kg)   General: Well appearing, well dressed, pleasant.  At times tearful when discussing her fears of going on heroin CV: RR, NR, no murmur Pulm: CTAB, no wheezing Ext: Thin, no edema, TTP with palpation over legs diffusely Gait: Normal, brisk, no sign of deficit.    Assessment/Plan:  See Problem Based Charting in the Encounters Tab   Sid Falcon, MD  10/27/2016  9:43 AM

## 2016-10-27 NOTE — Patient Instructions (Signed)
Brenda [name] - -  Thank you for coming in today.   For your suboxone - please continue two films per day and add a half film at night to see if this helps with evening symptoms and insomnia.   For your anxiety - Please continue Klonopin.  This is a short term medication and you should expect to have this tapered down at next visit.  The best medication for you would be a long acting medication such as Cymbalta.  We will discuss with our pharmacist at next visit to see if you can get this more inexpensively.    Please come back to clinic in 1 week.

## 2016-10-27 NOTE — Assessment & Plan Note (Signed)
Brenda [name] is doing better as regards her anxiety today.  She notes that the clonazepam is improving her symptoms. She continues to have anxiety causing issues including her oncologist unexpectedly calling her for follow up (though only appointments I could find in Epic were for December).  We discussed at length better options for control of her anxiety including SSRI or SNRI therapy and weaning her clonazepam to off. I reminded her that the clonazepam is only meant to be on board for 2-4 weeks maximum.  She is interested in Cymbalta for therapy, but is concerned at the cost.  I discussed with her about going to see a psychiatrist to manage this anxiety and she noted that she has been "committed" 5 times in her life and seen a myriad of psychiatrists. She is not interested in going back to this type of provider.    Plan Continue clonazepam BID #14 for the next week, wean to #10 next week, or to off Discuss with pharmacist about getting Cymbalta if possible. May need to use another SSRI which is more inexpensive.

## 2016-10-27 NOTE — Assessment & Plan Note (Addendum)
Patient has moderate OUD due to chronic pain in the neck and lower back which is not amenable to surgery.  She has been off an on multiple narcotics over the last years.  She was initiated on Suboxone Therapy on 10/13/16.    Today she looks very well and notes that the suboxone is helping, along with the clonazepam.  She is well kept, walking easily and pleasant.  She does report a wearing off effect because of how she takes the medication and would like to try a third dose of medication.  She noted that she would need narcotics if she were ever to need surgery, but she is not sure when/if that will ever happen.    We reviewed her UDS and she is adamant that she has ceased use of cocaine, THC and other narcotics.  She notes that her urine is "clean" today.  I advised her that if UDS remain positive for other substances, we would need to consider changing the therapeutic plan and consider pain management clinics or methadone clinic.   I reviewed the [place] narcotic database and it was appropriate.    Increase suboxone to 20mg  daily dose, 1 film BID with a half film in the evenings.   Follow up interval: every 1 week

## 2016-10-28 ENCOUNTER — Encounter: Payer: Self-pay | Admitting: Pharmacist

## 2016-10-28 NOTE — Progress Notes (Signed)
Patient was approved for free suboxone under manufacturer patient assistance program. If any questions can call [phone]

## 2016-11-03 ENCOUNTER — Ambulatory Visit (INDEPENDENT_AMBULATORY_CARE_PROVIDER_SITE_OTHER): Payer: Self-pay | Admitting: Student in an Organized Health Care Education/Training Program

## 2016-11-03 VITALS — BP 169/86 | HR 89 | Temp 98.3°F | Wt 114.5 lb

## 2016-11-03 DIAGNOSIS — F17210 Nicotine dependence, cigarettes, uncomplicated: Secondary | ICD-10-CM

## 2016-11-03 DIAGNOSIS — F419 Anxiety disorder, unspecified: Secondary | ICD-10-CM

## 2016-11-03 DIAGNOSIS — Z79891 Long term (current) use of opiate analgesic: Secondary | ICD-10-CM

## 2016-11-03 DIAGNOSIS — Z79899 Other long term (current) drug therapy: Secondary | ICD-10-CM

## 2016-11-03 DIAGNOSIS — M5030 Other cervical disc degeneration, unspecified cervical region: Secondary | ICD-10-CM

## 2016-11-03 DIAGNOSIS — F329 Major depressive disorder, single episode, unspecified: Secondary | ICD-10-CM

## 2016-11-03 DIAGNOSIS — F17290 Nicotine dependence, other tobacco product, uncomplicated: Secondary | ICD-10-CM

## 2016-11-03 DIAGNOSIS — M5136 Other intervertebral disc degeneration, lumbar region: Secondary | ICD-10-CM

## 2016-11-03 DIAGNOSIS — F32A Depression, unspecified: Secondary | ICD-10-CM

## 2016-11-03 DIAGNOSIS — G8929 Other chronic pain: Secondary | ICD-10-CM

## 2016-11-03 DIAGNOSIS — F1120 Opioid dependence, uncomplicated: Secondary | ICD-10-CM

## 2016-11-03 MED ORDER — CLONAZEPAM 0.5 MG PO TABS: 0.5000 mg | ORAL_TABLET | Freq: Two times a day (BID) | ORAL | 0 refills | Status: DC | PRN

## 2016-11-03 MED ORDER — BUPRENORPHINE HCL-NALOXONE HCL 8-2 MG SL FILM: ORAL_FILM | SUBLINGUAL | 0 refills | Status: DC

## 2016-11-03 MED FILL — SUBOXONE 8 MG-2 MG SL FILM: 8-2 | 7 days supply | Qty: 18 | Fill #0

## 2016-11-03 MED FILL — clonazePAM 0.5 MG TABS: 0.5 | 7 days supply | Qty: 14 | Fill #0

## 2016-11-03 NOTE — Patient Instructions (Signed)
1. Continue your medications as you are doing.   2. You are doing a great job.

## 2016-11-03 NOTE — Assessment & Plan Note (Signed)
Patient has moderate opioid use disorder and has been doing well on Suboxone medication assisted therapy since July 3. Symptomatically she is stable, current dosing of Suboxone is adequate to keep her out of withdrawal. She still having some cravings, used illicit Percocet once last week. 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 However I think she is gaining more insight into the addiction component. We did counseling today about the course of chronic pain and addiction. I reviewed the available UDS, unfortunately results from last week is not available today. We obtained another UDS today. I reviewed the control database system which showed appropriate dispensing. I refilled one week supply of Suboxone, 2.5 films per day. Follow-up in one week. I think she is getting close to being able to go to the two-week visits, I told her we could do that next week if she gives a clean urine sample as an incentive.

## 2016-11-03 NOTE — Assessment & Plan Note (Signed)
Symptomatically much improved on low dose clonazepam. I am going to continue current dosing of clonazepam, and we can start weaning down over the next few weeks. Patient understands.

## 2016-11-03 NOTE — Progress Notes (Signed)
   11/03/2016  Brenda [name] presents for follow up of opioid use disorder I have reviewed the prior induction visit, follow up visits, and telephone encounters relevant to opiate use disorder (OUD) treatment.   Current daily dose: 20 mg daily  Date of Induction: 10/13/16  Current follow up interval, in weeks: 1  The patient has been adherent to the buprenorphine for OUD contract:   Last UDS Result: 7/10 inappropriate for cocaine and morphine.   HPI: Brenda [name] is a 56 year old woman here for follow-up of opioid use disorder. She's been on Suboxone therapy for 3 weeks now and is doing fairly well. Financial difficulties are improved now that she is on pharmaceutical assistance program which gives her Suboxone free for one year. She reports that current dosing of Suboxone has been adequate with keeping her out of withdrawal. She does feel still chronic pain in her neck and lower back. She reported that the chronic pain was severe on Sunday and she took a friend's Percocet, however says that it did not help her pain either. We talked briefly about the pharmacodynamics about Suboxone. I think the patient's coming to realize that she may be more suited for methadone however currently cannot afford that treatment. She reports that her anxiety is very well controlled on current clonazepam, denies any illicit benzo use. Does not use alcohol. No fevers or chills. No recent injections.   She is very excited about bringing 2 of her close friends into treatment at this clinic. She reports to me that both of them have chronic pain issues but are also addicted to opioid pain medications, which they obtain on the street.   Exam:   Vitals:   11/03/16 0840  BP: (!) 169/86  Pulse: 89  Temp: 98.3 F (36.8 C)  TempSrc: Oral  SpO2: 98%  Weight: 114 lb 8 oz (51.9 kg)    Gen: well appearing, calm, walking with a cane CV: RRR, no murmur Lungs: clear throughout Ext: warm, well perfused, no edema, normal  joints.  Assessment/Plan:  See Problem Based Charting in the Encounters Tab  Medication Management - Patient is doing well on current daily dose, which is continued today. UDS ordered.    Follow up interval: every 1 weeks   Axel Filler, MD  11/03/2016  9:14 AM

## 2016-11-04 ENCOUNTER — Telehealth: Payer: Self-pay | Admitting: Student in an Organized Health Care Education/Training Program

## 2016-11-04 LAB — HIV ANTIBODY (ROUTINE TESTING W REFLEX): HIV Screen 4th Generation wRfx: NONREACTIVE

## 2016-11-04 LAB — RPR: RPR Ser Ql: NONREACTIVE

## 2016-11-04 NOTE — Telephone Encounter (Signed)
I called and left a VM that labs were normal from yesterday.

## 2016-11-05 LAB — TOXASSURE SELECT,+ANTIDEPR,UR

## 2016-11-10 ENCOUNTER — Ambulatory Visit (INDEPENDENT_AMBULATORY_CARE_PROVIDER_SITE_OTHER): Payer: Self-pay | Admitting: Internal Medicine

## 2016-11-10 VITALS — BP 156/66 | HR 55 | Temp 98.2°F | Wt 115.9 lb

## 2016-11-10 DIAGNOSIS — F17290 Nicotine dependence, other tobacco product, uncomplicated: Secondary | ICD-10-CM

## 2016-11-10 DIAGNOSIS — Z8249 Family history of ischemic heart disease and other diseases of the circulatory system: Secondary | ICD-10-CM

## 2016-11-10 DIAGNOSIS — F1120 Opioid dependence, uncomplicated: Secondary | ICD-10-CM

## 2016-11-10 DIAGNOSIS — F17210 Nicotine dependence, cigarettes, uncomplicated: Secondary | ICD-10-CM

## 2016-11-10 DIAGNOSIS — F419 Anxiety disorder, unspecified: Secondary | ICD-10-CM

## 2016-11-10 DIAGNOSIS — Z87891 Personal history of nicotine dependence: Secondary | ICD-10-CM

## 2016-11-10 DIAGNOSIS — F329 Major depressive disorder, single episode, unspecified: Secondary | ICD-10-CM

## 2016-11-10 DIAGNOSIS — I10 Essential (primary) hypertension: Secondary | ICD-10-CM

## 2016-11-10 LAB — TOXASSURE SELECT,+ANTIDEPR,UR

## 2016-11-10 MED ORDER — BUPRENORPHINE HCL-NALOXONE HCL 8-2 MG SL FILM: ORAL_FILM | SUBLINGUAL | 0 refills | Status: DC

## 2016-11-10 MED ORDER — CLONAZEPAM 0.5 MG PO TABS: 0.5000 mg | ORAL_TABLET | Freq: Two times a day (BID) | ORAL | 0 refills | Status: DC | PRN

## 2016-11-10 MED FILL — SUBOXONE 8 MG-2 MG SL FILM: 8-2 | 7 days supply | Qty: 18 | Fill #0

## 2016-11-10 MED FILL — clonazePAM 0.5 MG TABS: 0.5 | 7 days supply | Qty: 14 | Fill #0

## 2016-11-10 NOTE — Progress Notes (Signed)
Internal Medicine Clinic Attending  I saw and evaluated the patient.  I personally confirmed the key portions of the history and exam documented by Dr. Tiburcio Pea and I reviewed pertinent patient test results.  The assessment, diagnosis, and plan were formulated together and I agree with the documentation in the resident's note.  Ms. Slagel is a 56yo woman with moderate OUD who has been in treatment with [place] for about a month.  Aidyn has only used pills in the past per her, and no injectable substances.  She notes that her cravings are pretty much gone and she no longer has any withdrawal issues with the 2.5 film/day dosing.  She continues to have chronic pain issues and uncontrolled bipolar disorder.  She does note that her anxiety is much improved with the clonazepam.  She is aware that weaning will need to occur in the next few weeks.  Last UDS available in the system is from 7/17 and was inappropriate.  We have discussed with her the need for compliance with the medication contract and no further illicit use of other substances.  She would like to be seen every 2 weeks, but she will need appropriate UDS consistently prior that occurring.  She has a UDS pending from the 7/24 visit and one from today which I will follow up.   Plan is for her to return in 1 week.  I gave her a 1 week supply of her medications.

## 2016-11-10 NOTE — Patient Instructions (Signed)
Thank you for your visit today  Please continue taking the suboxone. Please continue the clonazepam.  Please continue working on obtaining the med assist program. As your blood pressure was elevated, you will need to get back on the medications for your blood pressure.   Please follow up in 1 week.

## 2016-11-10 NOTE — Assessment & Plan Note (Signed)
BP Readings from Last 3 Encounters:  11/10/16 (!) 156/66  11/03/16 (!) 169/86  10/27/16 (!) 155/85   Blood pressure is elevated today, but patient has not been taking her current regimen of antihypertensives because of affordability. She is currently working on the med assist program for the antihypertensives.   Plan -encouraged the patient to continue working on the med assist.  -follow up as scheduled with pcp

## 2016-11-10 NOTE — Assessment & Plan Note (Signed)
Patient says that her symptoms are well controlled on the current dose of klonopin.  Plan -continue klonopin.

## 2016-11-10 NOTE — Progress Notes (Signed)
    11/10/2016  Brenda [name] presents for follow up of opioid use disorder I have reviewed the prior induction visit, follow up visits, and telephone encounters relevant to opiate use disorder (OUD) treatment.   Current daily dose: buprenorphine-naloxone 8-2 mg 2.5 films per day   Date of Induction: 10/13/2016  Current follow up interval, in weeks: 1 week    The patient has been adherent with the buprenorphine for OUD contract.   Last UDS Result:  Pending   HPI:  Brenda [name] is a 56 yo woman who comes here for follow up regarding the suboxone treatment for her moderate opioid use disorder. She says that she has been tolerating suboxone well. She denies any symptoms of withdrawal including diarrhea, shakiness, irritability, etc. She says that she manages her pain well with the suboxone, however, she has chronic pain and it is an ongoing issue for her. She denies any illicit drug use. She has not used any illicit drugs in the last week including percoset.  She is able to obtain the suboxone through med assistance program.   She also has concurrent anxiety related to her underlying bipolar disorder which she says is well controlled by the klonopin. She does not wish to see any psychiatrist currently and int he past she saw dr Elberta Fortis.   Apart from the suboxone and klonopin, she does not have any other medications currently including her antihypertensives as she is not able to afford them- even $4 prescriptions. She is currently working on getting the med assist program through that.    Exam:   Vitals:   11/10/16 0824  BP: (!) 156/66  Pulse: (!) 55  Temp: 98.2 F (36.8 C)  TempSrc: Oral  SpO2: 100%  Weight: 115 lb 14.4 oz (52.6 kg)    General: Vital signs reviewed. Patient in no acute distress Cardiovascular: regular rate, rhythm, no murmur appreciated  Pulmonary/Chest: Clear to auscultation bilaterally, no wheezes, rales, or rhonchi. Abdominal: Soft, non-tender, non-distended, BS  + Extremities: No lower extremity edema bilaterally Focal Neuro exam: normal sensory and motor strength. PERRLA   Assessment/Plan:  See Problem Based Charting in the Encounters Tab Case discussed with Dr Daryll Drown.    Burgess Estelle, MD  11/10/2016  9:35 AM

## 2016-11-10 NOTE — Assessment & Plan Note (Signed)
Patient is here for follow up regarding the suboxone treatment for her moderate opioid use disorder. She says that she has been tolerating suboxone well. She denies any symptoms of withdrawal including diarrhea, shakiness, irritability, etc. And so the current dose is able to keep her out of withdrawal. She says that she manages her pain well with the suboxone, however, she has chronic pain and it is an ongoing issue for her. She denies any illicit drug use. She has not used any illicit drugs in the last week including percoset.  She is able to obtain the suboxone through med assistance program. UDS from last week is still pending. [place] Database review shows appropriate dispensing. Labs were reviewed and HIV and RPR were negative. 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 Upon review of CDC guidelines, this does not require further follow up.    Plan -continue current dose of suboxone -Follow up in  week

## 2016-11-17 ENCOUNTER — Ambulatory Visit (INDEPENDENT_AMBULATORY_CARE_PROVIDER_SITE_OTHER): Payer: Self-pay | Admitting: Internal Medicine

## 2016-11-17 VITALS — BP 103/65 | HR 58 | Temp 97.8°F | Resp 20 | Wt 112.8 lb

## 2016-11-17 DIAGNOSIS — F32A Depression, unspecified: Secondary | ICD-10-CM

## 2016-11-17 DIAGNOSIS — F419 Anxiety disorder, unspecified: Secondary | ICD-10-CM

## 2016-11-17 DIAGNOSIS — Z79891 Long term (current) use of opiate analgesic: Secondary | ICD-10-CM

## 2016-11-17 DIAGNOSIS — F1120 Opioid dependence, uncomplicated: Secondary | ICD-10-CM

## 2016-11-17 DIAGNOSIS — F329 Major depressive disorder, single episode, unspecified: Secondary | ICD-10-CM

## 2016-11-17 DIAGNOSIS — F17210 Nicotine dependence, cigarettes, uncomplicated: Secondary | ICD-10-CM

## 2016-11-17 DIAGNOSIS — Z79899 Other long term (current) drug therapy: Secondary | ICD-10-CM

## 2016-11-17 LAB — TOXASSURE SELECT,+ANTIDEPR,UR

## 2016-11-17 MED ORDER — CLONAZEPAM 0.5 MG PO TABS: 0.5000 mg | ORAL_TABLET | Freq: Two times a day (BID) | ORAL | 0 refills | Status: DC | PRN

## 2016-11-17 MED ORDER — BUPRENORPHINE HCL-NALOXONE HCL 8-2 MG SL FILM: ORAL_FILM | SUBLINGUAL | 0 refills | Status: DC

## 2016-11-17 MED FILL — clonazePAM 0.5 MG TABS: 0.5 | 7 days supply | Qty: 14 | Fill #0

## 2016-11-17 MED FILL — SUBOXONE 8 MG-2 MG SL FILM: 8-2 | 8 days supply | Qty: 18 | Fill #0

## 2016-11-17 NOTE — Assessment & Plan Note (Signed)
Patient states her anxiety is being well controlled on clonazepam 0.5mg  BID. Past UDS inappropriately negative for substance due to using someone else's urine; today's sample, we're assured, is hers and will be appropriate.   Plan: --provide script for clonazepam 0.5mg  BID #14 --f/u in one week; will plan to taper off in future

## 2016-11-17 NOTE — Progress Notes (Signed)
evaluation

## 2016-11-17 NOTE — Progress Notes (Signed)
   11/17/2016  Brenda [name] presents for follow up of opioid use disorder I have reviewed the prior induction visit, follow up visits, and telephone encounters relevant to opiate use disorder (OUD) treatment.   Current daily dose: 2.5 films of buprenorphine-naloxone 8-2mg  daily  Date of Induction: 10/13/2016  Current follow up interval, in weeks: weekly  The patient has been adherent with the buprenorphine for OUD contract with some discrepancies  Last UDS Result: Buprenorphine appropriately present; clonazapam inappropriately absent; + desmethyldiazepman, oxazepam, temazepam inappropriately  HPI:  Brenda [name] states that the increased dose of buprenorphine has been adequate to control her withdrawals and cravings. She states that she will still have every once in a while some withdrawal symptoms (muscle aches, diarrhea) that mainly occur at night. She also states the the clonazapam is helping control her anxiety. She is not interested in counseling currently; her only support system are 2 friends who also have opioid use disorder. She endorses compliance with suboxone and clonazepam and denies use of illicit substances. Patient was made aware of her last two urine tox results which showed metabolites of oxycodone, valium and no clonazepam. Patient admitted that she had used a neighbor's urine sample due to fear that her use of marijuana would preclude her to further treatment with suboxone. She states that her urine sample today is her own, should be appropriate for her currently prescribed regimen and otherwise clear of other substances.   Exam:   Vitals:   11/17/16 0932  BP: 103/65  Pulse: (!) 58  Resp: 20  Temp: 97.8 F (36.6 C)  TempSrc: Oral  Weight: 112 lb 12.8 oz (51.2 kg)   Constitutional: mildly disheveled, tearful at times CV: RRR Resp: no increased work of breathing, CTAB Abd: soft, NDNT MSK: tenderness of palpation of lumbar spine and R SI joint; antalgic  gait  Assessment/Plan:  See Problem Based Charting in the Encounters Tab   [name], MD  11/17/2016  10:16 AM

## 2016-11-17 NOTE — Patient Instructions (Addendum)
Please continue taking 2 1/2 films of buprenorphine daily, and 1 tab of klonopin twice a day.   We will see you back in 1 week.

## 2016-11-17 NOTE — Progress Notes (Signed)
Internal Medicine Clinic Attending  I saw and evaluated the patient.  I personally confirmed the key portions of the history and exam documented by Dr. Jari Favre and I reviewed pertinent patient test results.  The assessment, diagnosis, and plan were formulated together and I agree with the documentation in the resident's note.  This patient is here for follow up of medication assisted therapy for opioid use disorder. I asked her about her several inappropriate urine tox studies over the last two weeks. At first she wondered if people were slipping her illicit substances, and then she confessed that she turned in urine that was not hers. She has done this before when she was receiving chronic oxycodone. I warned her that this is her last chance, and that we will not be able to continue MAT in this setting if her urine tox screens continue to be inappropriate. She voiced understanding. We will continue with suboxone for now, follow up in one week.

## 2016-11-17 NOTE — Assessment & Plan Note (Signed)
Patient endorses adequate control of her withdrawal and cravings with current suboxone regimen (2.5 films of 8-2mg  buprenophrine-naloxone daily). Two prior tox screens inappropriate however patient states she used neighbor's urine due to fear that being positive for marijuana would preclude her from further suboxone treatment. Her chronic pain is still an issue for her.   [place] controlled substances database checked 11/17/2016 and appropriate.   Plan: --f/u todays UDS --provide script for buprenorphine-naloxone 8-2mg  films #18 (2.5 films per day) --f/u in 1 week

## 2016-11-21 LAB — TOXASSURE SELECT,+ANTIDEPR,UR

## 2016-11-24 ENCOUNTER — Encounter: Payer: Self-pay | Admitting: Pharmacist

## 2016-11-24 ENCOUNTER — Ambulatory Visit (INDEPENDENT_AMBULATORY_CARE_PROVIDER_SITE_OTHER): Payer: Self-pay | Admitting: Internal Medicine

## 2016-11-24 VITALS — BP 179/76 | HR 85 | Temp 97.8°F | Ht 63.0 in | Wt 110.0 lb

## 2016-11-24 DIAGNOSIS — Z79899 Other long term (current) drug therapy: Secondary | ICD-10-CM

## 2016-11-24 DIAGNOSIS — F17290 Nicotine dependence, other tobacco product, uncomplicated: Secondary | ICD-10-CM

## 2016-11-24 DIAGNOSIS — F1120 Opioid dependence, uncomplicated: Secondary | ICD-10-CM

## 2016-11-24 DIAGNOSIS — F17210 Nicotine dependence, cigarettes, uncomplicated: Secondary | ICD-10-CM

## 2016-11-24 DIAGNOSIS — I2510 Atherosclerotic heart disease of native coronary artery without angina pectoris: Secondary | ICD-10-CM

## 2016-11-24 DIAGNOSIS — I10 Essential (primary) hypertension: Secondary | ICD-10-CM

## 2016-11-24 DIAGNOSIS — Z8249 Family history of ischemic heart disease and other diseases of the circulatory system: Secondary | ICD-10-CM

## 2016-11-24 DIAGNOSIS — E784 Other hyperlipidemia: Secondary | ICD-10-CM

## 2016-11-24 DIAGNOSIS — Z79891 Long term (current) use of opiate analgesic: Secondary | ICD-10-CM

## 2016-11-24 DIAGNOSIS — E7849 Other hyperlipidemia: Secondary | ICD-10-CM

## 2016-11-24 MED ORDER — CLONAZEPAM 0.5 MG PO TABS: 0.5000 mg | ORAL_TABLET | Freq: Two times a day (BID) | ORAL | 0 refills | Status: DC | PRN

## 2016-11-24 MED ORDER — LISINOPRIL-HYDROCHLOROTHIAZIDE 20-12.5 MG PO TABS: 1.0000 | ORAL_TABLET | Freq: Every day | ORAL | 3 refills | Status: DC

## 2016-11-24 MED ORDER — BUPRENORPHINE HCL-NALOXONE HCL 8-2 MG SL FILM: ORAL_FILM | SUBLINGUAL | 0 refills | Status: DC

## 2016-11-24 MED ORDER — ASPIRIN EC 81 MG PO TBEC: 81.0000 mg | DELAYED_RELEASE_TABLET | Freq: Every day | ORAL | 0 refills | Status: DC

## 2016-11-24 MED ORDER — NITROGLYCERIN 0.4 MG SL SUBL: 0.4000 mg | SUBLINGUAL_TABLET | SUBLINGUAL | 0 refills | Status: DC | PRN

## 2016-11-24 MED FILL — SUBOXONE 8 MG-2 MG SL FILM: 8-2 | 7 days supply | Qty: 18 | Fill #0

## 2016-11-24 MED FILL — clonazePAM 0.5 MG TABS: 0.5 | 7 days supply | Qty: 14 | Fill #0

## 2016-11-24 NOTE — Assessment & Plan Note (Signed)
Patient with adequate control of her withdrawal and cravings with buprenorphine-naloxone 8-2mg  2.5 films per day for her moderate opioid use disorder. She continues to have inappropriate UDS results with a question of those being of some-one else's urine. Patient guarantees [place] that today's sample will be appropriate for her suboxone and clonazepam and without other illicit substances. She is in agreement that if it is not, we will refer her to other substance use disorder treatment programs such as [facility] or [facility].   Plan: --f/u [facility]; if inappropriate, we will refer to another substance use disorder treatment program --refill buprenorphine-naloxone 8-2mg  2.5 films per day and clonazepam 0.5mg  BID PRN --otherwise will plan on seeing her in one week

## 2016-11-24 NOTE — Progress Notes (Signed)
Approved for [place] [facility] free pharmacy program.

## 2016-11-24 NOTE — Progress Notes (Signed)
Internal Medicine Clinic Attending  I saw and evaluated the patient.  I personally confirmed the key portions of the history and exam documented by Dr. Jari Favre and I reviewed pertinent patient test results.  The assessment, diagnosis, and plan were formulated together and I agree with the documentation in the resident's note.  Brenda [name] is here for follow-up of opioid use disorder, she reports that she has been doing much better this last week. She reports good compliance with buprenorphine and denies illicit substance use. Unfortunately after 6 weeks of treatment in our buprenorphine clinic we have been unable to get her completely clean, U tox from last visit continues to show diazepam and no clonazepam. She again reported that she lied to [place] last week, that the urine sample she turned in was not her own. She says that when I asked her about this last week she felt backed into a corner and she felt like she had to say whatever it would take so that we wouldn't kick her out of the clinic. We went over her 6 weeks' worth of toxicology tests and talked about how we have given her multiple chances to become clean. The patient assures me that today's sample is her own and it will be clean, should show buprenorphine and clonazepam. I'm willing to give her this one last chance. If the U tox is again inappropriate, I think she will require a higher level of care for her polysubstance use treatment. I'm going to schedule her for a tentative follow-up appointment in 1 week with our [place] clinic, however if the u tox is inappropriate I will ask her to cancel and instead seek help at [facility], [facility], or [facility]one [facility]. This one week supply of Buprenorphine can help her in that transition. We talked for a while about how she just may need a higher level of observed daily care. She reassured me that her sample today will be totally clean and that she can succeed in our clinic.

## 2016-11-24 NOTE — Assessment & Plan Note (Signed)
Patient was previously on simvastatin 20mg  daily for hyperlipidemia. She has been without it for at least 2-3 months per patient.  Plan: --restart simvastatin 20mg  daily

## 2016-11-24 NOTE — Patient Instructions (Addendum)
Please take 2 and a half films of suboxone a day, and 1 tab of klonopin twice a day.  We will see you tentatively in one week in the [place] clinic.  You should be receiving lisinopril-HCTZ by mail; please take on tablet once a day for now and we will adjust as needed based on your blood pressures in clinic.

## 2016-11-24 NOTE — Progress Notes (Signed)
   11/24/2016  Brenda [name] presents for follow up of opioid use disorder I have reviewed the prior induction visit, follow up visits, and telephone encounters relevant to opiate use disorder (OUD) treatment.   Current daily dose: 2.5 films buprenorphine-naltrexone 8-2mg  per day  Date of Induction: 10/13/2016  Current follow up interval, in weeks: 1  The patient has not been adherent with the buprenorphine for OUD contract.   Last UDS Result: +buprenorphine and metabolites; +metabolites of ethanol and valium; no clonazapam  HPI:  Ms. Brenda [name] is a 56yo female with opioid use disorder currently on buprenorphine-naloxone 8-2mg  2.5 films per day. Patient states that she feels her withdrawal symptoms are well controlled on current dose; she does still have cravings which is mainly due to her continued chronic pain. She endorses compliance with clonazepam which she states works well to control her anxiety. At last visit, patient admitted that her previous urine samples were not her own as an explanation for inappropriate UDS results. She did at that time assure [place] her 8/7 urine sample was her own and should be appropriate; on addressing the results of it today she is unsure that it was her own urine and denies other drug use except for occasional marijuana; she also denies diverting her clonazepam which has consistently been absent from her UDS.   Exam:   Vitals:   11/24/16 0920  BP: (!) 179/76  Pulse: 85  Temp: 97.8 F (36.6 C)  TempSrc: Oral  SpO2: 97%  Weight: 110 lb (49.9 kg)  Height: 5\' 3"  (1.6 m)   Constitutional: NAD, continues to focus conversation on her chronic pain CV: RRR, no murmurs, rubs or gallops appreciated Resp: CTAB, no increased work of breathing Ext: warm, moves all 4 extremities freely; walks with assistance of cane  Assessment/Plan:  See Problem Based Charting in the Encounters Tab   [name], MD  11/24/2016  9:22 AM

## 2016-11-24 NOTE — Assessment & Plan Note (Addendum)
Patient with uncontrolled hypertension; current reading in offic eat 179/76. She has been off of all medications other than suboxone and clonazepam due to inability to afford them. Was previously on amlodipine, lisinopril-HCTZ, metoprolol, spironolactone.   She has been approved for medication assistance through [place] so I will begin restarting her medications; it is uncertain if at any point she was actually taking all 5 classes of BP meds concurrently.  Plan: --start lisinopril-HCTZ 20-12.5mg  daily

## 2016-11-28 LAB — TOXASSURE SELECT,+ANTIDEPR,UR

## 2016-12-01 ENCOUNTER — Ambulatory Visit (INDEPENDENT_AMBULATORY_CARE_PROVIDER_SITE_OTHER): Payer: Self-pay | Admitting: Internal Medicine

## 2016-12-01 VITALS — BP 201/87 | HR 63 | Temp 98.2°F | Ht 63.0 in | Wt 113.9 lb

## 2016-12-01 DIAGNOSIS — I10 Essential (primary) hypertension: Secondary | ICD-10-CM

## 2016-12-01 DIAGNOSIS — F419 Anxiety disorder, unspecified: Secondary | ICD-10-CM

## 2016-12-01 DIAGNOSIS — F329 Major depressive disorder, single episode, unspecified: Secondary | ICD-10-CM

## 2016-12-01 DIAGNOSIS — F32A Depression, unspecified: Secondary | ICD-10-CM

## 2016-12-01 DIAGNOSIS — F1120 Opioid dependence, uncomplicated: Secondary | ICD-10-CM

## 2016-12-01 NOTE — Patient Instructions (Addendum)
Brenda [name], the following are local suboxone and methadone clinics which I think will be helpful in providing you with a higher level of care with daily visits and comprehensive treatment with counseling as well.  [facility] [phone] [place], [place], [place] [phone], [address] [place], [place]

## 2016-12-01 NOTE — Assessment & Plan Note (Signed)
Patient has not received her lisinopril-HCTZ yet from mail in pharmacy; her BP today is 201/87. She denies cocaine use.   Plan: --will have patient f/u with me for regular medical care and BP check in about 3-4 weeks to give time for meds to be delivered and treatment started.

## 2016-12-01 NOTE — Progress Notes (Signed)
   12/01/2016  Brenda [name] presents for follow up of opioid use disorder I have reviewed the prior induction visit, follow up visits, and telephone encounters relevant to opiate use disorder (OUD) treatment.   Current daily dose: 20mg  suboxone daily  Date of Induction: 10/13/2016  Current follow up interval, in weeks: 1  The patient has not been adherent with the buprenorphine for OUD contract.   Last UDS Result: inappropriate for cocaine; did have buprenorphine and clonazepam  HPI:  Brenda [name] is a 56yo female with moderate opioid use disorder here for f/u on medication assisted treatment. She has had several inappropriate urine tox screens with admission that some of those urine samples were not hers. Last week, we had a long discussion and an agreement that if that urine tox screen was inappropriate (which it was for cocaine), patient would be referred to higher level of care with local suboxone/methadone treatment centers who are able to follow patient daily. Today, patient states that the last week has been much better as she has had less troubles sleeping and is overall feeling better. She denies concurrent illicit drug use and maintains compliance with suboxone and clonazepam. Patient denies cocaine use and states last urine tox screen could not be correct as she has not used cocaine recently. We briefly discussed following with [facility] for free [place] management of her Bipolar disorder which patient was skeptical about due to prior poor experience. I discussed with her again our plan to refer her to another treatment center, however patient did not wish to have further discussion and left the visit early.  Exam:   Vitals:   12/01/16 0916 12/01/16 0917  BP: (!) 201/87 (!) 201/87  Pulse: 63 63  Temp: 98.2 F (36.8 C) 98.2 F (36.8 C)  TempSrc: Oral Oral  SpO2: 99%   Weight: 113 lb 14.4 oz (51.7 kg) 113 lb 14.4 oz (51.7 kg)  Height: 5\' 3"  (1.6 m)    Constitutional: visibly  upset CV: RRR Resp: no increased work of breathing  Assessment/Plan:  See Problem Based Charting in the Encounters Tab   [name], MD  12/01/2016  9:25 AM

## 2016-12-01 NOTE — Assessment & Plan Note (Signed)
Patient with multiple inappropriate urine tox screens and previous self-reported use of other people's urine. Last week, we had a long discussion and an agreement that if that urine tox screen was inappropriate (which it was for cocaine), patient would be referred to higher level of care with local suboxone/methadone treatment centers who are able to follow patient daily and address her back pain as well if candidate for methadone. With last week's inappropriate result (+cocaine which patient denies), we will no longer be able to provide OUD treatment and will refer her to another treatment center. Patient upset by this and left visit early.   Plan: --Patient will no longer be provided with suboxone through our clinic; will mail patient contact information for local suboxone/methadone clinics

## 2016-12-01 NOTE — Assessment & Plan Note (Signed)
Patient reports compliance with clonazepam and it was present on last urine tox screen, along with cocaine. Due to multiple inappropriate urine tox screens, we will no longer be providing her with clonazepam. She was advised to f/u with [facility] for management of anxiety and bipolar disorder.

## 2016-12-02 IMAGING — CR DG RIBS W/ CHEST 3+V*R*
5 series · 5 of 5 positions shown · non-contrast
Comparison: 03/26/2014

CLINICAL DATA: Right anterior rib pain for 6 days. No known injury.

EXAM:
RIGHT RIBS AND CHEST - 3+ VIEW

[w chest pa]
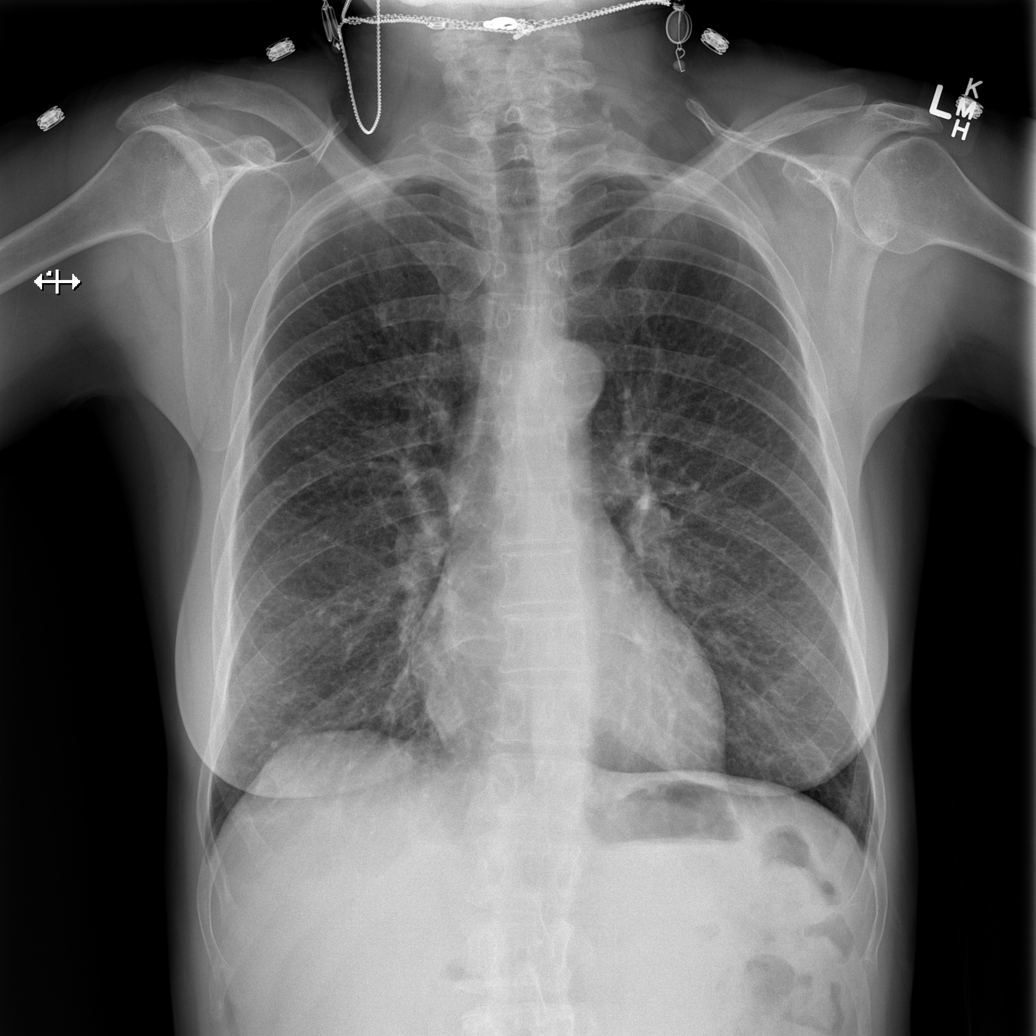

[w ribs ap upper right]
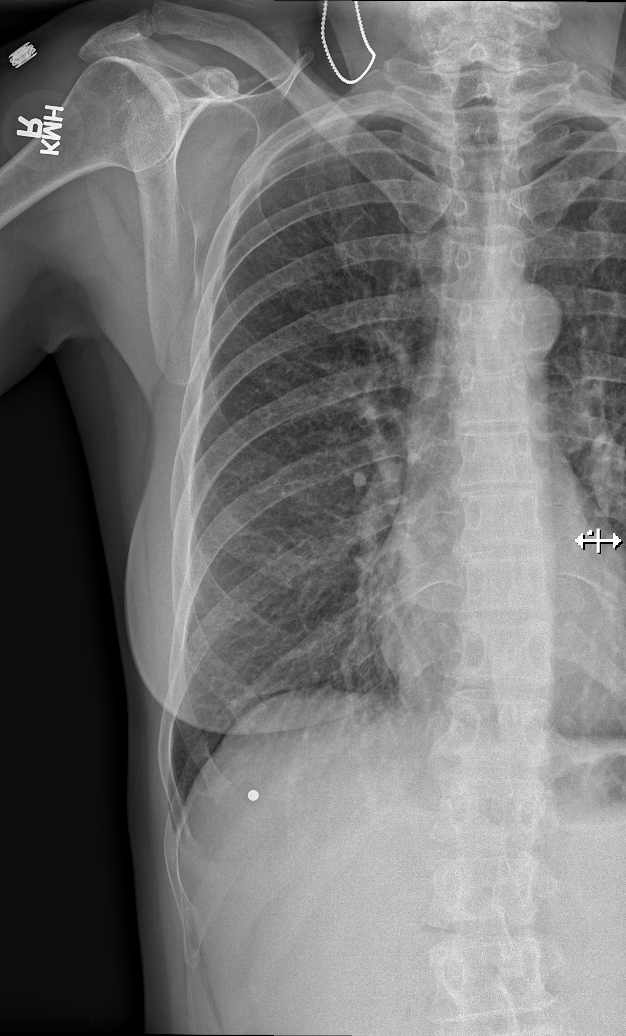

[w ribs ap lower right]
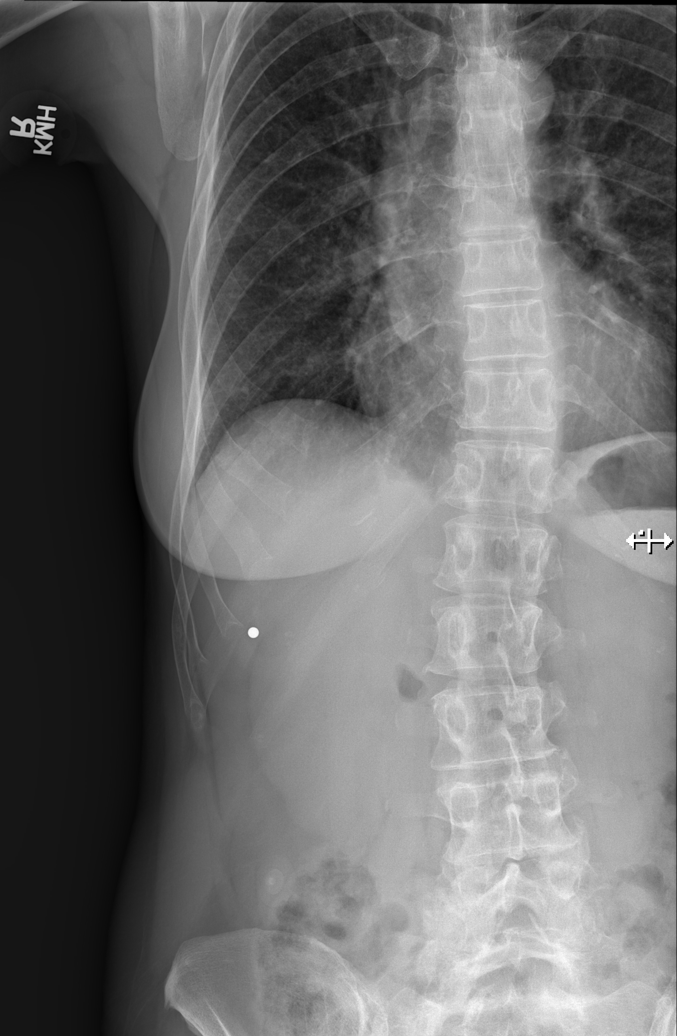

[w ribs obl right (1 of 2)]
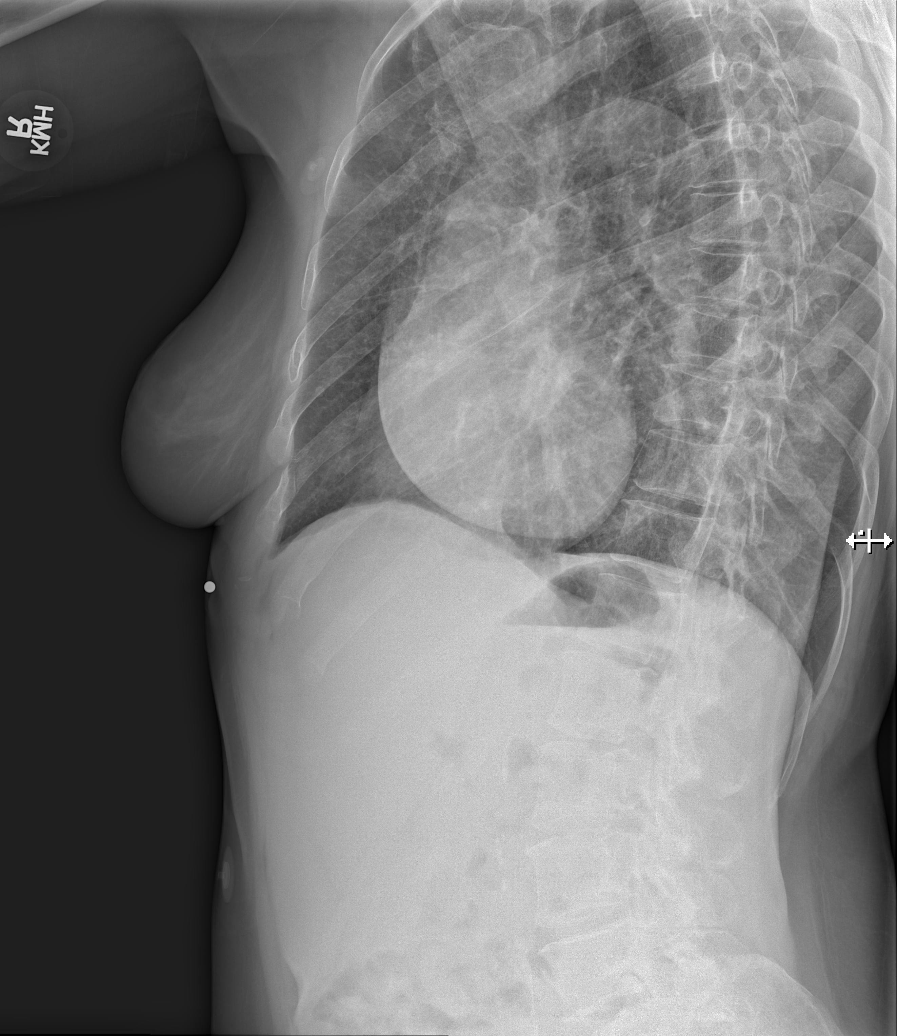

[w ribs obl right (2 of 2)]
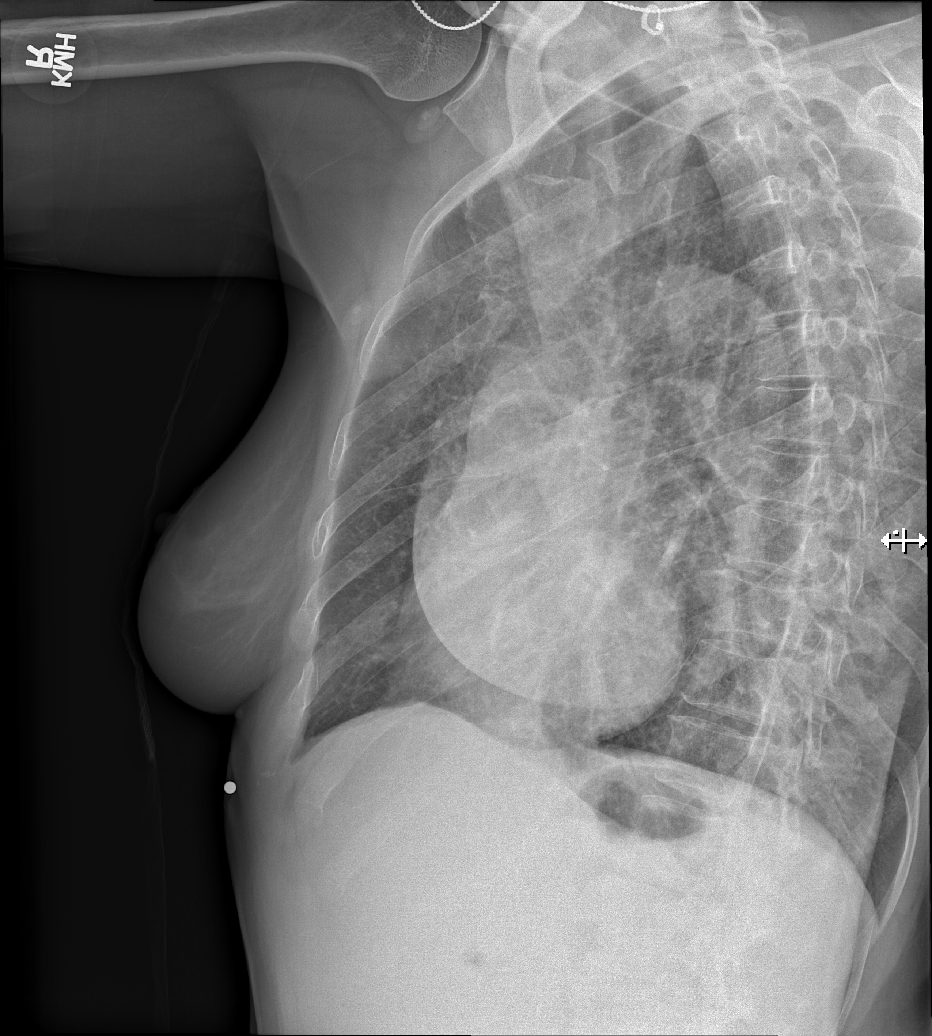

[5 of 5 positions shown; findings below may reference images not displayed]

FINDINGS: Heart is normal size.  Mild interstitial pro

Mild interstitial prominence within the lungs. Mild interstitial
prominence within the lungs without confluent airspace opacity or
effusion. No pneumothorax. Slight irregularity within the right
lower anterior ribs, particularly the anterior right seventh rib.
Cannot exclude nondisplaced fracture.
IMPRESSION: Mild irregularity in the anterior right seventh rib, possible
nondisplaced fracture. No associated pleural effusion or
pneumothorax.

Mild interstitial prominence within the lungs, likely chronic
interstitial disease.

## 2016-12-02 NOTE — Progress Notes (Signed)
Internal Medicine Clinic Attending  Case discussed with Dr. Jari Favre at the time of the visit.  We reviewed the resident's history and exam and pertinent patient test results.  I agree with the assessment, diagnosis, and plan of care documented in the resident's note.  Ms. Brenda [name] has been receiving care in our clinic since 10/13/16 for OUD.  She presented today for treatment of the same. She has had a mixed picture of moderate opioid use disorder and chronic pain and mental health disorder of bipolar disorder.  She was doing subjectively well on suboxone therapy, however, in her 7 weeks of treatment, she has not supplied an appropriate urine tox screen.  She was told at last visit that if her urine tox screen was not appropriate that we would no longer be able to provide care for her in our clinic and she would need a higher level of care at another [facility] clinic with in house psychiatric care or methadone providers.  She has admitted to continued use of illicit substances, including narcotics during her time with [place].  She has also reported giving samples of urine which were not hers on multiple occasions.  She has been sceptical about going back to see mental health, though I think this would be of benefit to her.  Dr. Jari Favre began this discussion with the patient, and she was not pleased with the documented plan and left the visit early.  She was offered contact information for clinics with a higher level of care as well as further support, however, she left prior to my being able to see her.  Dr. Jari Favre is per PCP and will plan to see her in the near future.  We gave her a list of other centers by mail as she left prior to her AVS being printed.   Based on multiple red flags and multiple violations of pain contract, she is no longer a candidate for suboxone therapy from our clinic and should not be rescheduled with this clinic.  She will maintain her status as having [place] serve as her PCP.

## 2016-12-10 ENCOUNTER — Telehealth: Payer: Self-pay | Admitting: *Deleted

## 2016-12-10 NOTE — Telephone Encounter (Signed)
Pt would like for you to call her asap, she states she is having severe pain, she has not been able to get out of bed for 3 days, a friend has come to get her and is caring for her in her home. She states she needs to talk to you , she is desperate and would like to be seen tues in OUD- states "ive had a cometo Jesus meeting with myself and I cant go on like this" She will call back this pm because she is out of minutes til next week and will have to borrow someone's phone Please advise There is an 0815 left for tues in OUD

## 2016-12-10 NOTE — Telephone Encounter (Signed)
In my opinion Brenda [name] should follow up with [facility]. We have tried to stabilize her over six weeks in our [place] clinic and were unable to get her clean. I think she needs a higher level of care than what we can provide.

## 2016-12-21 NOTE — Telephone Encounter (Signed)
I have called her multiple times, she had stated she would call clinic back because of problems w/ her phone

## 2017-03-04 ENCOUNTER — Emergency Department (HOSPITAL_COMMUNITY): Payer: No Typology Code available for payment source

## 2017-03-04 ENCOUNTER — Other Ambulatory Visit: Payer: Self-pay

## 2017-03-04 ENCOUNTER — Encounter (HOSPITAL_COMMUNITY): Payer: Self-pay | Admitting: Emergency Medicine

## 2017-03-04 ENCOUNTER — Inpatient Hospital Stay (HOSPITAL_COMMUNITY)
Admission: EM | Admit: 2017-03-04 | Discharge: 2017-03-08 | DRG: 493 | Disposition: A | Discharge status: Skilled Nursing Facility | Payer: No Typology Code available for payment source | Attending: Orthopedic Surgery | Admitting: Orthopedic Surgery

## 2017-03-04 DIAGNOSIS — M549 Dorsalgia, unspecified: Secondary | ICD-10-CM | POA: Diagnosis present

## 2017-03-04 DIAGNOSIS — S82462A Displaced segmental fracture of shaft of left fibula, initial encounter for closed fracture: Secondary | ICD-10-CM | POA: Diagnosis present

## 2017-03-04 DIAGNOSIS — C3492 Malignant neoplasm of unspecified part of left bronchus or lung: Secondary | ICD-10-CM | POA: Diagnosis present

## 2017-03-04 DIAGNOSIS — M542 Cervicalgia: Secondary | ICD-10-CM | POA: Diagnosis present

## 2017-03-04 DIAGNOSIS — Y92410 Unspecified street and highway as the place of occurrence of the external cause: Secondary | ICD-10-CM

## 2017-03-04 DIAGNOSIS — F419 Anxiety disorder, unspecified: Secondary | ICD-10-CM | POA: Diagnosis present

## 2017-03-04 DIAGNOSIS — F4310 Post-traumatic stress disorder, unspecified: Secondary | ICD-10-CM | POA: Diagnosis present

## 2017-03-04 DIAGNOSIS — G8929 Other chronic pain: Secondary | ICD-10-CM | POA: Diagnosis present

## 2017-03-04 DIAGNOSIS — Z7982 Long term (current) use of aspirin: Secondary | ICD-10-CM

## 2017-03-04 DIAGNOSIS — S82111A Displaced fracture of right tibial spine, initial encounter for closed fracture: Secondary | ICD-10-CM | POA: Diagnosis present

## 2017-03-04 DIAGNOSIS — F332 Major depressive disorder, recurrent severe without psychotic features: Secondary | ICD-10-CM

## 2017-03-04 DIAGNOSIS — E785 Hyperlipidemia, unspecified: Secondary | ICD-10-CM | POA: Diagnosis present

## 2017-03-04 DIAGNOSIS — D72829 Elevated white blood cell count, unspecified: Secondary | ICD-10-CM | POA: Diagnosis present

## 2017-03-04 DIAGNOSIS — G4700 Insomnia, unspecified: Secondary | ICD-10-CM | POA: Diagnosis present

## 2017-03-04 DIAGNOSIS — E119 Type 2 diabetes mellitus without complications: Secondary | ICD-10-CM | POA: Diagnosis present

## 2017-03-04 DIAGNOSIS — M5124 Other intervertebral disc displacement, thoracic region: Secondary | ICD-10-CM | POA: Diagnosis present

## 2017-03-04 DIAGNOSIS — Z23 Encounter for immunization: Secondary | ICD-10-CM

## 2017-03-04 DIAGNOSIS — I2510 Atherosclerotic heart disease of native coronary artery without angina pectoris: Secondary | ICD-10-CM | POA: Diagnosis present

## 2017-03-04 DIAGNOSIS — S82241A Displaced spiral fracture of shaft of right tibia, initial encounter for closed fracture: Principal | ICD-10-CM | POA: Diagnosis present

## 2017-03-04 DIAGNOSIS — R64 Cachexia: Secondary | ICD-10-CM | POA: Diagnosis present

## 2017-03-04 DIAGNOSIS — I10 Essential (primary) hypertension: Secondary | ICD-10-CM

## 2017-03-04 DIAGNOSIS — Z902 Acquired absence of lung [part of]: Secondary | ICD-10-CM

## 2017-03-04 DIAGNOSIS — R402413 Glasgow coma scale score 13-15, at hospital admission: Secondary | ICD-10-CM | POA: Diagnosis present

## 2017-03-04 DIAGNOSIS — F17210 Nicotine dependence, cigarettes, uncomplicated: Secondary | ICD-10-CM | POA: Diagnosis present

## 2017-03-04 DIAGNOSIS — S82202A Unspecified fracture of shaft of left tibia, initial encounter for closed fracture: Secondary | ICD-10-CM | POA: Diagnosis present

## 2017-03-04 DIAGNOSIS — Z79899 Other long term (current) drug therapy: Secondary | ICD-10-CM

## 2017-03-04 DIAGNOSIS — F1410 Cocaine abuse, uncomplicated: Secondary | ICD-10-CM | POA: Diagnosis present

## 2017-03-04 DIAGNOSIS — F1110 Opioid abuse, uncomplicated: Secondary | ICD-10-CM | POA: Diagnosis present

## 2017-03-04 HISTORY — DX: Other intervertebral disc displacement, thoracic region: M51.24

## 2017-03-04 HISTORY — DX: Other intervertebral disc degeneration, thoracic region: M51.34

## 2017-03-04 HISTORY — DX: Atherosclerotic heart disease of native coronary artery without angina pectoris: I25.10

## 2017-03-04 HISTORY — DX: Malignant (primary) neoplasm, unspecified: C80.1

## 2017-03-04 LAB — CBC
HCT: 40.9 % (ref 36.0–46.0)
HEMOGLOBIN: 13.9 g/dL (ref 12.0–15.0)
MCH: 32 pg (ref 26.0–34.0)
MCHC: 34 g/dL (ref 30.0–36.0)
MCV: 94 fL (ref 78.0–100.0)
PLATELETS: 259 10*3/uL (ref 150–400)
RBC: 4.35 MIL/uL (ref 3.87–5.11)
RDW: 14.5 % (ref 11.5–15.5)
WBC: 18.2 10*3/uL — ABNORMAL HIGH (ref 4.0–10.5)

## 2017-03-04 LAB — I-STAT CHEM 8, ED
BUN: 21 mg/dL — AB (ref 6–20)
CALCIUM ION: 1.13 mmol/L — AB (ref 1.15–1.40)
CREATININE: 0.9 mg/dL (ref 0.44–1.00)
Chloride: 108 mmol/L (ref 101–111)
GLUCOSE: 128 mg/dL — AB (ref 65–99)
HCT: 42 % (ref 36.0–46.0)
Hemoglobin: 14.3 g/dL (ref 12.0–15.0)
POTASSIUM: 4.6 mmol/L (ref 3.5–5.1)
Sodium: 143 mmol/L (ref 135–145)
TCO2: 25 mmol/L (ref 22–32)

## 2017-03-04 LAB — ETHANOL

## 2017-03-04 LAB — COMPREHENSIVE METABOLIC PANEL
ALK PHOS: 83 U/L (ref 38–126)
ALT: 17 U/L (ref 14–54)
ANION GAP: 8 (ref 5–15)
AST: 29 U/L (ref 15–41)
Albumin: 4 g/dL (ref 3.5–5.0)
BILIRUBIN TOTAL: 0.7 mg/dL (ref 0.3–1.2)
BUN: 19 mg/dL (ref 6–20)
CALCIUM: 8.8 mg/dL — AB (ref 8.9–10.3)
CO2: 24 mmol/L (ref 22–32)
CREATININE: 1 mg/dL (ref 0.44–1.00)
Chloride: 108 mmol/L (ref 101–111)
Glucose, Bld: 128 mg/dL — ABNORMAL HIGH (ref 65–99)
Potassium: 4.6 mmol/L (ref 3.5–5.1)
Sodium: 140 mmol/L (ref 135–145)
TOTAL PROTEIN: 7.6 g/dL (ref 6.5–8.1)

## 2017-03-04 LAB — URINALYSIS, ROUTINE W REFLEX MICROSCOPIC
BILIRUBIN URINE: NEGATIVE
Bacteria, UA: NONE SEEN
Glucose, UA: NEGATIVE mg/dL
Ketones, ur: NEGATIVE mg/dL
LEUKOCYTES UA: NEGATIVE
Nitrite: NEGATIVE
PH: 5 (ref 5.0–8.0)
Protein, ur: 30 mg/dL — AB
SPECIFIC GRAVITY, URINE: 1.021 (ref 1.005–1.030)

## 2017-03-04 LAB — I-STAT CG4 LACTIC ACID, ED: LACTIC ACID, VENOUS: 1.4 mmol/L (ref 0.5–1.9)

## 2017-03-04 LAB — SAMPLE TO BLOOD BANK

## 2017-03-04 LAB — PROTIME-INR
INR: 0.98
PROTHROMBIN TIME: 12.9 s (ref 11.4–15.2)

## 2017-03-04 LAB — CDS SEROLOGY

## 2017-03-04 MED ORDER — MORPHINE SULFATE (PF) 4 MG/ML IV SOLN
INTRAVENOUS | Status: AC
  Filled 2017-03-04: qty 1

## 2017-03-04 MED ORDER — MORPHINE SULFATE (PF) 4 MG/ML IV SOLN
4.0000 mg | Freq: Once | INTRAVENOUS | Status: AC
  Administered 2017-03-04: 4 mg via INTRAVENOUS

## 2017-03-04 MED ORDER — ONDANSETRON HCL 4 MG/2ML IJ SOLN
4.0000 mg | Freq: Once | INTRAMUSCULAR | Status: AC
  Administered 2017-03-04: 4 mg via INTRAVENOUS

## 2017-03-04 MED ORDER — ONDANSETRON HCL 4 MG/2ML IJ SOLN
INTRAMUSCULAR | Status: AC
  Administered 2017-03-04: 4 mg
  Filled 2017-03-04: qty 2

## 2017-03-04 MED ORDER — IOPAMIDOL (ISOVUE-300) INJECTION 61%
INTRAVENOUS | Status: AC
  Filled 2017-03-04: qty 100

## 2017-03-04 MED ORDER — MORPHINE SULFATE (PF) 4 MG/ML IV SOLN: 8.0000 mg | Freq: Once | INTRAVENOUS | Status: DC

## 2017-03-04 MED ORDER — MORPHINE SULFATE (PF) 4 MG/ML IV SOLN
INTRAVENOUS | Status: AC
Start: 2017-03-04 — End: 2017-03-05
  Filled 2017-03-04: qty 1

## 2017-03-04 MED ORDER — ONDANSETRON HCL 4 MG/2ML IJ SOLN
INTRAMUSCULAR | Status: AC
  Filled 2017-03-04: qty 2

## 2017-03-04 NOTE — ED Notes (Signed)
Dr Doran Durand at bedside.

## 2017-03-04 NOTE — ED Notes (Signed)
Patient taken to XRAY and CT

## 2017-03-04 NOTE — Progress Notes (Signed)
Orthopedic Tech Progress Note Patient Details:  Brenda [name] [birth year] [id number]  Ortho Devices Type of Ortho Device: Ace wrap, Post (short leg) splint, Knee Immobilizer Ortho Device/Splint Location: KI, ace wrap, RLE, SLS, LLE Ortho Device/Splint Interventions: Ordered, Application   Braulio Bosch 03/04/2017, 11:08 PM

## 2017-03-04 NOTE — Progress Notes (Signed)
Follow up from previous Chaplain. Patient states she is scared of having surgery again and states she is ready to go home because she is tired of Chronic pain.  When I asked her what home meant, she stated to heaven.  She further states that she has buried all of her family and all of her friends have turned to pieces of crap.  We talked about what she needed to feel more peaceful being that she is alive.  She stated that she is a religious woman so we prayed together.  She thanked me for the prayer and then we changed the conversation and she started talking about her mouth being dry and that she hoped they would wait to do surgery until tomorrow but this time she seemed less sad.  She began talking about her upcoming court date in January and how of all times she will probably be in a wheelchair. Available as needed.  Thank you to the medical team for caring for this patient.    03/04/17 2312  Clinical Encounter Type  Visited With Patient  Visit Type Follow-up;Spiritual support  Spiritual Encounters  Spiritual Needs Prayer;Emotional
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
## 2017-03-04 NOTE — ED Notes (Signed)
Patient returned from xray.  CT not ready at this time.
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
## 2017-03-04 NOTE — ED Notes (Signed)
Patient to CT.
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
## 2017-03-05 ENCOUNTER — Encounter (HOSPITAL_COMMUNITY): Payer: Self-pay

## 2017-03-05 ENCOUNTER — Inpatient Hospital Stay: Admit: 2017-03-05 | Payer: No Typology Code available for payment source | Admitting: Orthopedic Surgery

## 2017-03-05 ENCOUNTER — Encounter (HOSPITAL_COMMUNITY)
Admission: EM | Disposition: A | Discharge status: Skilled Nursing Facility | Payer: Self-pay | Source: Home / Self Care | Attending: Orthopedic Surgery

## 2017-03-05 ENCOUNTER — Inpatient Hospital Stay (HOSPITAL_COMMUNITY): Payer: No Typology Code available for payment source | Admitting: Certified Registered Nurse Anesthetist

## 2017-03-05 DIAGNOSIS — M2550 Pain in unspecified joint: Secondary | ICD-10-CM

## 2017-03-05 DIAGNOSIS — F319 Bipolar disorder, unspecified: Secondary | ICD-10-CM

## 2017-03-05 DIAGNOSIS — S82242A Displaced spiral fracture of shaft of left tibia, initial encounter for closed fracture: Secondary | ICD-10-CM

## 2017-03-05 DIAGNOSIS — G4700 Insomnia, unspecified: Secondary | ICD-10-CM

## 2017-03-05 DIAGNOSIS — F332 Major depressive disorder, recurrent severe without psychotic features: Secondary | ICD-10-CM

## 2017-03-05 DIAGNOSIS — Z85110 Personal history of malignant carcinoid tumor of bronchus and lung: Secondary | ICD-10-CM

## 2017-03-05 DIAGNOSIS — M549 Dorsalgia, unspecified: Secondary | ICD-10-CM

## 2017-03-05 DIAGNOSIS — Y9301 Activity, walking, marching and hiking: Secondary | ICD-10-CM

## 2017-03-05 DIAGNOSIS — S82114A Nondisplaced fracture of right tibial spine, initial encounter for closed fracture: Secondary | ICD-10-CM

## 2017-03-05 DIAGNOSIS — S82115A Nondisplaced fracture of left tibial spine, initial encounter for closed fracture: Secondary | ICD-10-CM

## 2017-03-05 DIAGNOSIS — F17210 Nicotine dependence, cigarettes, uncomplicated: Secondary | ICD-10-CM

## 2017-03-05 DIAGNOSIS — S82832A Other fracture of upper and lower end of left fibula, initial encounter for closed fracture: Secondary | ICD-10-CM

## 2017-03-05 DIAGNOSIS — Y92410 Unspecified street and highway as the place of occurrence of the external cause: Secondary | ICD-10-CM

## 2017-03-05 DIAGNOSIS — F1910 Other psychoactive substance abuse, uncomplicated: Secondary | ICD-10-CM

## 2017-03-05 DIAGNOSIS — F4310 Post-traumatic stress disorder, unspecified: Secondary | ICD-10-CM

## 2017-03-05 DIAGNOSIS — S82202A Unspecified fracture of shaft of left tibia, initial encounter for closed fracture: Secondary | ICD-10-CM

## 2017-03-05 DIAGNOSIS — R450 Nervousness: Secondary | ICD-10-CM

## 2017-03-05 DIAGNOSIS — Z902 Acquired absence of lung [part of]: Secondary | ICD-10-CM

## 2017-03-05 DIAGNOSIS — S82401A Unspecified fracture of shaft of right fibula, initial encounter for closed fracture: Secondary | ICD-10-CM

## 2017-03-05 HISTORY — PX: ORIF TIBIA FRACTURE: SHX5416

## 2017-03-05 HISTORY — PX: OPEN REDUCTION INTERNAL FIXATION (ORIF) TIBIA/FIBULA FRACTURE: SHX5992

## 2017-03-05 LAB — SURGICAL PCR SCREEN
MRSA, PCR: NEGATIVE
STAPHYLOCOCCUS AUREUS: NEGATIVE

## 2017-03-05 LAB — CBC
HCT: 30.6 % — ABNORMAL LOW (ref 36.0–46.0)
HEMOGLOBIN: 10 g/dL — AB (ref 12.0–15.0)
MCH: 31.2 pg (ref 26.0–34.0)
MCHC: 32.7 g/dL (ref 30.0–36.0)
MCV: 95.3 fL (ref 78.0–100.0)
PLATELETS: 197 10*3/uL (ref 150–400)
RBC: 3.21 MIL/uL — ABNORMAL LOW (ref 3.87–5.11)
RDW: 14.5 % (ref 11.5–15.5)
WBC: 8.6 10*3/uL (ref 4.0–10.5)

## 2017-03-05 LAB — CREATININE, SERUM
CREATININE: 0.8 mg/dL (ref 0.44–1.00)
GFR calc Af Amer: >60 mL/min (ref >60)
GFR calc non Af Amer: >60 mL/min (ref >60)

## 2017-03-05 LAB — GLUCOSE, CAPILLARY
Glucose-Capillary: 182 mg/dL — ABNORMAL HIGH (ref 65–99)
Glucose-Capillary: 143 mg/dL — ABNORMAL HIGH (ref 65–99)

## 2017-03-05 LAB — HIV ANTIBODY (ROUTINE TESTING W REFLEX): HIV Screen 4th Generation wRfx: NONREACTIVE

## 2017-03-05 SURGERY — OPEN REDUCTION INTERNAL FIXATION (ORIF) TIBIA/FIBULA FRACTURE
Anesthesia: General | Site: Leg Lower | Laterality: Left

## 2017-03-05 MED ORDER — ACETAMINOPHEN 650 MG RE SUPP: 650.0000 mg | Freq: Four times a day (QID) | RECTAL | Status: DC | PRN

## 2017-03-05 MED ORDER — MIDAZOLAM HCL 2 MG/2ML IJ SOLN
2.0000 mg | Freq: Once | INTRAMUSCULAR | Status: AC
  Administered 2017-03-05: 2 mg via INTRAVENOUS

## 2017-03-05 MED ORDER — LISINOPRIL 20 MG PO TABS
20.0000 mg | ORAL_TABLET | Freq: Every day | ORAL | Status: DC
  Administered 2017-03-05 – 2017-03-08 (×4): 20 mg via ORAL
  Filled 2017-03-05 (×4): qty 1

## 2017-03-05 MED ORDER — HYDROMORPHONE HCL 1 MG/ML IJ SOLN
INTRAMUSCULAR | Status: AC
  Administered 2017-03-05: 0.5 mg via INTRAVENOUS
  Filled 2017-03-05: qty 1

## 2017-03-05 MED ORDER — ACETAMINOPHEN 325 MG PO TABS
650.0000 mg | ORAL_TABLET | ORAL | Status: DC | PRN
  Administered 2017-03-05: 650 mg via ORAL
  Filled 2017-03-05: qty 2

## 2017-03-05 MED ORDER — MIDAZOLAM HCL 2 MG/2ML IJ SOLN
INTRAMUSCULAR | Status: AC
  Administered 2017-03-05: 2 mg via INTRAVENOUS
  Filled 2017-03-05: qty 2

## 2017-03-05 MED ORDER — FENTANYL CITRATE (PF) 250 MCG/5ML IJ SOLN
INTRAMUSCULAR | Status: AC
  Filled 2017-03-05: qty 5

## 2017-03-05 MED ORDER — HYDROMORPHONE HCL 1 MG/ML IJ SOLN
0.2500 mg | INTRAMUSCULAR | Status: DC | PRN
  Administered 2017-03-05 (×2): 0.5 mg via INTRAVENOUS

## 2017-03-05 MED ORDER — BUPIVACAINE HCL (PF) 0.25 % IJ SOLN
INTRAMUSCULAR | Status: AC
  Filled 2017-03-05: qty 30

## 2017-03-05 MED ORDER — LIDOCAINE 2% (20 MG/ML) 5 ML SYRINGE
INTRAMUSCULAR | Status: AC
  Filled 2017-03-05: qty 5

## 2017-03-05 MED ORDER — ONDANSETRON HCL 4 MG/2ML IJ SOLN: 4.0000 mg | Freq: Four times a day (QID) | INTRAMUSCULAR | Status: DC | PRN

## 2017-03-05 MED ORDER — SENNA 8.6 MG PO TABS
1.0000 | ORAL_TABLET | Freq: Two times a day (BID) | ORAL | Status: DC
  Administered 2017-03-06 – 2017-03-08 (×4): 8.6 mg via ORAL
  Filled 2017-03-05 (×5): qty 1

## 2017-03-05 MED ORDER — ROPIVACAINE HCL 5 MG/ML IJ SOLN
INTRAMUSCULAR | Status: DC | PRN
  Administered 2017-03-05: 30 mL via PERINEURAL

## 2017-03-05 MED ORDER — PHENYLEPHRINE 40 MCG/ML (10ML) SYRINGE FOR IV PUSH (FOR BLOOD PRESSURE SUPPORT)
PREFILLED_SYRINGE | INTRAVENOUS | Status: DC | PRN
  Administered 2017-03-05 (×2): 40 ug via INTRAVENOUS

## 2017-03-05 MED ORDER — ACETAMINOPHEN 325 MG PO TABS: 650.0000 mg | ORAL_TABLET | Freq: Four times a day (QID) | ORAL | Status: DC | PRN

## 2017-03-05 MED ORDER — FENTANYL CITRATE (PF) 100 MCG/2ML IJ SOLN
INTRAMUSCULAR | Status: AC
  Administered 2017-03-05: 50 ug via INTRAVENOUS
  Filled 2017-03-05: qty 2

## 2017-03-05 MED ORDER — PROMETHAZINE HCL 25 MG/ML IJ SOLN: 6.2500 mg | INTRAMUSCULAR | Status: DC | PRN

## 2017-03-05 MED ORDER — SUGAMMADEX SODIUM 200 MG/2ML IV SOLN
INTRAVENOUS | Status: AC
  Filled 2017-03-05: qty 2

## 2017-03-05 MED ORDER — FENTANYL CITRATE (PF) 100 MCG/2ML IJ SOLN
50.0000 ug | Freq: Once | INTRAMUSCULAR | Status: AC
  Administered 2017-03-05: 50 ug via INTRAVENOUS

## 2017-03-05 MED ORDER — PHENYLEPHRINE 40 MCG/ML (10ML) SYRINGE FOR IV PUSH (FOR BLOOD PRESSURE SUPPORT)
PREFILLED_SYRINGE | INTRAVENOUS | Status: AC
  Filled 2017-03-05: qty 10

## 2017-03-05 MED ORDER — FENTANYL CITRATE (PF) 100 MCG/2ML IJ SOLN
INTRAMUSCULAR | Status: DC | PRN
  Administered 2017-03-05: 25 ug via INTRAVENOUS
  Administered 2017-03-05: 50 ug via INTRAVENOUS
  Administered 2017-03-05 (×2): 25 ug via INTRAVENOUS
  Administered 2017-03-05: 50 ug via INTRAVENOUS

## 2017-03-05 MED ORDER — OXYCODONE HCL 5 MG PO TABS
ORAL_TABLET | ORAL | Status: AC
  Filled 2017-03-05: qty 2

## 2017-03-05 MED ORDER — ONDANSETRON HCL 4 MG/2ML IJ SOLN
INTRAMUSCULAR | Status: AC
  Filled 2017-03-05: qty 2

## 2017-03-05 MED ORDER — 0.9 % SODIUM CHLORIDE (POUR BTL) OPTIME
TOPICAL | Status: DC | PRN
  Administered 2017-03-05: 1000 mL

## 2017-03-05 MED ORDER — KETOROLAC TROMETHAMINE 15 MG/ML IJ SOLN
7.5000 mg | Freq: Four times a day (QID) | INTRAMUSCULAR | Status: AC
  Administered 2017-03-05 – 2017-03-06 (×4): 7.5 mg via INTRAVENOUS
  Filled 2017-03-05 (×4): qty 1

## 2017-03-05 MED ORDER — EPHEDRINE 5 MG/ML INJ
INTRAVENOUS | Status: AC
  Filled 2017-03-05: qty 10

## 2017-03-05 MED ORDER — DEXAMETHASONE SODIUM PHOSPHATE 10 MG/ML IJ SOLN
INTRAMUSCULAR | Status: AC
  Filled 2017-03-05: qty 1

## 2017-03-05 MED ORDER — PROPOFOL 10 MG/ML IV BOLUS
INTRAVENOUS | Status: DC | PRN
  Administered 2017-03-05: 150 mg via INTRAVENOUS

## 2017-03-05 MED ORDER — HYDROMORPHONE HCL 1 MG/ML IJ SOLN
1.0000 mg | INTRAMUSCULAR | Status: DC | PRN
  Administered 2017-03-05 (×4): 1 mg via INTRAVENOUS
  Filled 2017-03-05 (×4): qty 1

## 2017-03-05 MED ORDER — MAGNESIUM HYDROXIDE 400 MG/5ML PO SUSP: 30.0000 mL | Freq: Every day | ORAL | Status: DC | PRN

## 2017-03-05 MED ORDER — DEXAMETHASONE SODIUM PHOSPHATE 10 MG/ML IJ SOLN
INTRAMUSCULAR | Status: DC | PRN
  Administered 2017-03-05: 5 mg via INTRAVENOUS

## 2017-03-05 MED ORDER — ROCURONIUM BROMIDE 10 MG/ML (PF) SYRINGE
PREFILLED_SYRINGE | INTRAVENOUS | Status: AC
  Filled 2017-03-05: qty 5

## 2017-03-05 MED ORDER — ONDANSETRON HCL 4 MG/2ML IJ SOLN
INTRAMUSCULAR | Status: DC | PRN
  Administered 2017-03-05: 4 mg via INTRAVENOUS

## 2017-03-05 MED ORDER — CHLORHEXIDINE GLUCONATE 4 % EX LIQD
60.0000 mL | Freq: Once | CUTANEOUS | Status: AC
  Administered 2017-03-05: 4 via TOPICAL
  Filled 2017-03-05: qty 60

## 2017-03-05 MED ORDER — SUGAMMADEX SODIUM 200 MG/2ML IV SOLN
INTRAVENOUS | Status: DC | PRN
  Administered 2017-03-05: 100 mg via INTRAVENOUS

## 2017-03-05 MED ORDER — POVIDONE-IODINE 10 % EX SWAB
2.0000 "application " | Freq: Once | CUTANEOUS | Status: AC
  Administered 2017-03-05: 2 via TOPICAL

## 2017-03-05 MED ORDER — MIDAZOLAM HCL 2 MG/2ML IJ SOLN
INTRAMUSCULAR | Status: AC
  Filled 2017-03-05: qty 2

## 2017-03-05 MED ORDER — BISACODYL 10 MG RE SUPP: 10.0000 mg | Freq: Every day | RECTAL | Status: DC | PRN

## 2017-03-05 MED ORDER — ROCURONIUM BROMIDE 10 MG/ML (PF) SYRINGE
PREFILLED_SYRINGE | INTRAVENOUS | Status: DC | PRN
  Administered 2017-03-05 (×2): 5 mg via INTRAVENOUS
  Administered 2017-03-05: 40 mg via INTRAVENOUS

## 2017-03-05 MED ORDER — HYDROMORPHONE HCL 1 MG/ML IJ SOLN
0.5000 mg | INTRAMUSCULAR | Status: DC | PRN
  Administered 2017-03-05 – 2017-03-06 (×8): 1 mg via INTRAVENOUS
  Administered 2017-03-06: 2 mg via INTRAVENOUS
  Administered 2017-03-06 (×2): 1 mg via INTRAVENOUS
  Administered 2017-03-07 (×2): 2 mg via INTRAVENOUS
  Administered 2017-03-07: 1 mg via INTRAVENOUS
  Administered 2017-03-07: 2 mg via INTRAVENOUS
  Administered 2017-03-07: 1 mg via INTRAVENOUS
  Administered 2017-03-07: 2 mg via INTRAVENOUS
  Administered 2017-03-08 (×4): 1 mg via INTRAVENOUS
  Filled 2017-03-05: qty 2
  Filled 2017-03-05 (×2): qty 1
  Filled 2017-03-05: qty 2
  Filled 2017-03-05: qty 1
  Filled 2017-03-05 (×2): qty 2
  Filled 2017-03-05: qty 1
  Filled 2017-03-05: qty 2
  Filled 2017-03-05 (×12): qty 1

## 2017-03-05 MED ORDER — DOCUSATE SODIUM 100 MG PO CAPS
100.0000 mg | ORAL_CAPSULE | Freq: Two times a day (BID) | ORAL | Status: DC
  Administered 2017-03-06 – 2017-03-08 (×4): 100 mg via ORAL
  Filled 2017-03-05 (×5): qty 1

## 2017-03-05 MED ORDER — KCL IN DEXTROSE-NACL 20-5-0.45 MEQ/L-%-% IV SOLN
INTRAVENOUS | Status: DC
  Administered 2017-03-05: 01:00:00 via INTRAVENOUS
  Filled 2017-03-05: qty 1000

## 2017-03-05 MED ORDER — INFLUENZA VAC SPLIT QUAD 0.5 ML IM SUSY
0.5000 mL | PREFILLED_SYRINGE | INTRAMUSCULAR | Status: AC
  Administered 2017-03-06: 0.5 mL via INTRAMUSCULAR
  Filled 2017-03-05: qty 0.5

## 2017-03-05 MED ORDER — KCL IN DEXTROSE-NACL 20-5-0.45 MEQ/L-%-% IV SOLN
INTRAVENOUS | Status: DC
  Administered 2017-03-05: 12:00:00 via INTRAVENOUS
  Filled 2017-03-05: qty 1000

## 2017-03-05 MED ORDER — ACETAMINOPHEN 650 MG RE SUPP: 650.0000 mg | RECTAL | Status: DC | PRN

## 2017-03-05 MED ORDER — EPHEDRINE SULFATE-NACL 50-0.9 MG/10ML-% IV SOSY
PREFILLED_SYRINGE | INTRAVENOUS | Status: DC | PRN
  Administered 2017-03-05: 5 mg via INTRAVENOUS

## 2017-03-05 MED ORDER — PROPOFOL 10 MG/ML IV BOLUS
INTRAVENOUS | Status: AC
  Filled 2017-03-05: qty 20

## 2017-03-05 MED ORDER — KETOROLAC TROMETHAMINE 15 MG/ML IJ SOLN
7.5000 mg | Freq: Four times a day (QID) | INTRAMUSCULAR | Status: DC
  Administered 2017-03-05 (×2): 7.5 mg via INTRAVENOUS
  Filled 2017-03-05 (×2): qty 1

## 2017-03-05 MED ORDER — METHOCARBAMOL 500 MG PO TABS
500.0000 mg | ORAL_TABLET | Freq: Four times a day (QID) | ORAL | Status: DC | PRN
  Administered 2017-03-05 – 2017-03-08 (×9): 500 mg via ORAL
  Filled 2017-03-05 (×9): qty 1

## 2017-03-05 MED ORDER — LIDOCAINE 2% (20 MG/ML) 5 ML SYRINGE
INTRAMUSCULAR | Status: DC | PRN
  Administered 2017-03-05: 40 mg via INTRAVENOUS

## 2017-03-05 MED ORDER — ONDANSETRON HCL 4 MG PO TABS: 4.0000 mg | ORAL_TABLET | Freq: Four times a day (QID) | ORAL | Status: DC | PRN

## 2017-03-05 MED ORDER — BUPIVACAINE-EPINEPHRINE (PF) 0.25% -1:200000 IJ SOLN
INTRAMUSCULAR | Status: AC
  Filled 2017-03-05: qty 30

## 2017-03-05 MED ORDER — MAGNESIUM CITRATE PO SOLN: 1.0000 | Freq: Once | ORAL | Status: DC | PRN

## 2017-03-05 MED ORDER — CEFAZOLIN SODIUM-DEXTROSE 2-4 GM/100ML-% IV SOLN
2.0000 g | INTRAVENOUS | Status: AC
  Administered 2017-03-05: 2 g via INTRAVENOUS
  Filled 2017-03-05: qty 100

## 2017-03-05 MED ORDER — LISINOPRIL-HYDROCHLOROTHIAZIDE 20-12.5 MG PO TABS: 1.0000 | ORAL_TABLET | Freq: Every day | ORAL | Status: DC

## 2017-03-05 MED ORDER — LACTATED RINGERS IV SOLN
INTRAVENOUS | Status: DC
  Administered 2017-03-05: 08:00:00 via INTRAVENOUS

## 2017-03-05 MED ORDER — OXYCODONE HCL 5 MG PO TABS
5.0000 mg | ORAL_TABLET | ORAL | Status: DC | PRN
  Administered 2017-03-05 – 2017-03-08 (×18): 10 mg via ORAL
  Filled 2017-03-05 (×17): qty 2

## 2017-03-05 MED ORDER — ENOXAPARIN SODIUM 40 MG/0.4ML ~~LOC~~ SOLN
40.0000 mg | SUBCUTANEOUS | Status: DC
  Administered 2017-03-06 – 2017-03-08 (×3): 40 mg via SUBCUTANEOUS
  Filled 2017-03-05 (×3): qty 0.4

## 2017-03-05 MED ORDER — HYDROCHLOROTHIAZIDE 12.5 MG PO CAPS
12.5000 mg | ORAL_CAPSULE | Freq: Every day | ORAL | Status: DC
  Administered 2017-03-05 – 2017-03-08 (×4): 12.5 mg via ORAL
  Filled 2017-03-05 (×4): qty 1

## 2017-03-05 SURGICAL SUPPLY — 76 items
BANDAGE ESMARK 6X9 LF (GAUZE/BANDAGES/DRESSINGS) ×1 IMPLANT
BIT DRILL 2.5X2.75 QC CALB (BIT) ×3 IMPLANT
BLADE SURG 15 STRL LF DISP TIS (BLADE) ×1 IMPLANT
BLADE SURG 15 STRL SS (BLADE) ×2
BNDG COHESIVE 4X5 TAN STRL (GAUZE/BANDAGES/DRESSINGS) ×3 IMPLANT
BNDG COHESIVE 6X5 TAN STRL LF (GAUZE/BANDAGES/DRESSINGS) ×3 IMPLANT
BNDG ESMARK 6X9 LF (GAUZE/BANDAGES/DRESSINGS) ×3
CANISTER SUCT 3000ML PPV (MISCELLANEOUS) ×3 IMPLANT
CHLORAPREP W/TINT 26ML (MISCELLANEOUS) ×6 IMPLANT
COVER SURGICAL LIGHT HANDLE (MISCELLANEOUS) ×3 IMPLANT
CUFF TOURNIQUET SINGLE 24IN (TOURNIQUET CUFF) ×3 IMPLANT
CUFF TOURNIQUET SINGLE 34IN LL (TOURNIQUET CUFF) IMPLANT
CUFF TOURNIQUET SINGLE 44IN (TOURNIQUET CUFF) IMPLANT
DRAPE C-ARMOR (DRAPES) IMPLANT
DRAPE INCISE IOBAN 66X45 STRL (DRAPES) IMPLANT
DRAPE OEC MINIVIEW 54X84 (DRAPES) ×3 IMPLANT
DRAPE ORTHO SPLIT 77X108 STRL (DRAPES)
DRAPE SURG ORHT 6 SPLT 77X108 (DRAPES) IMPLANT
DRAPE U-SHAPE 47X51 STRL (DRAPES) ×3 IMPLANT
DRSG ADAPTIC 3X8 NADH LF (GAUZE/BANDAGES/DRESSINGS) IMPLANT
DRSG MEPILEX BORDER 4X4 (GAUZE/BANDAGES/DRESSINGS) IMPLANT
DRSG MEPITEL 4X7.2 (GAUZE/BANDAGES/DRESSINGS) ×3 IMPLANT
DRSG PAD ABDOMINAL 8X10 ST (GAUZE/BANDAGES/DRESSINGS) ×6 IMPLANT
ELECT REM PT RETURN 9FT ADLT (ELECTROSURGICAL) ×3
ELECTRODE REM PT RTRN 9FT ADLT (ELECTROSURGICAL) ×1 IMPLANT
GAUZE SPONGE 4X4 12PLY STRL (GAUZE/BANDAGES/DRESSINGS) ×3 IMPLANT
GLOVE BIO SURGEON STRL SZ8 (GLOVE) ×3 IMPLANT
GLOVE BIOGEL PI IND STRL 8 (GLOVE) ×2 IMPLANT
GLOVE BIOGEL PI INDICATOR 8 (GLOVE) ×4
GLOVE ECLIPSE 8.0 STRL XLNG CF (GLOVE) ×6 IMPLANT
GOWN STRL REUS W/ TWL LRG LVL3 (GOWN DISPOSABLE) ×1 IMPLANT
GOWN STRL REUS W/ TWL XL LVL3 (GOWN DISPOSABLE) ×2 IMPLANT
GOWN STRL REUS W/TWL LRG LVL3 (GOWN DISPOSABLE) ×2
GOWN STRL REUS W/TWL XL LVL3 (GOWN DISPOSABLE) ×6
K-WIRE ACE 1.6X6 (WIRE) ×6
KIT BASIN OR (CUSTOM PROCEDURE TRAY) ×3 IMPLANT
KIT ROOM TURNOVER OR (KITS) ×3 IMPLANT
KWIRE ACE 1.6X6 (WIRE) ×2 IMPLANT
MANIFOLD NEPTUNE II (INSTRUMENTS) IMPLANT
NEEDLE 22X1 1/2 (OR ONLY) (NEEDLE) IMPLANT
NS IRRIG 1000ML POUR BTL (IV SOLUTION) ×3 IMPLANT
PACK ORTHO EXTREMITY (CUSTOM PROCEDURE TRAY) ×3 IMPLANT
PAD ARMBOARD 7.5X6 YLW CONV (MISCELLANEOUS) IMPLANT
PAD CAST 4YDX4 CTTN HI CHSV (CAST SUPPLIES) ×1 IMPLANT
PADDING CAST COTTON 4X4 STRL (CAST SUPPLIES) ×2
PADDING CAST COTTON 6X4 STRL (CAST SUPPLIES) ×3 IMPLANT
PLATE 6H 142 LT MED DIST TIB (Plate) ×3 IMPLANT
SCREW CORTICAL 3.5MM  28MM (Screw) ×2 IMPLANT
SCREW CORTICAL 3.5MM 26MM (Screw) ×3 IMPLANT
SCREW CORTICAL 3.5MM 28MM (Screw) ×1 IMPLANT
SCREW CORTICAL 3.5MM 36MM (Screw) ×3 IMPLANT
SCREW LOCK CORT STAR 3.5X22 (Screw) ×3 IMPLANT
SCREW LOCK CORT STAR 3.5X30 (Screw) ×3 IMPLANT
SCREW LOCK CORT STAR 3.5X38 (Screw) ×3 IMPLANT
SCREW LOW PROFILE 3.5MMX42 (Screw) ×3 IMPLANT
SPLINT PLASTER CAST XFAST 5X30 (CAST SUPPLIES) ×1 IMPLANT
SPLINT PLASTER XFAST SET 5X30 (CAST SUPPLIES) ×2
SPONGE LAP 18X18 X RAY DECT (DISPOSABLE) IMPLANT
STAPLER VISISTAT 35W (STAPLE) IMPLANT
SUCTION FRAZIER HANDLE 10FR (MISCELLANEOUS)
SUCTION TUBE FRAZIER 10FR DISP (MISCELLANEOUS) IMPLANT
SUT ETHILON 2 0 FS 18 (SUTURE) IMPLANT
SUT ETHILON 3 0 PS 1 (SUTURE) ×3 IMPLANT
SUT MNCRL AB 3-0 PS2 18 (SUTURE) IMPLANT
SUT VIC AB 0 CT1 27 (SUTURE)
SUT VIC AB 0 CT1 27XBRD ANBCTR (SUTURE) IMPLANT
SUT VIC AB 2-0 CT1 27 (SUTURE) ×2
SUT VIC AB 2-0 CT1 TAPERPNT 27 (SUTURE) ×1 IMPLANT
SUT VIC AB 2-0 FS1 27 (SUTURE) ×3 IMPLANT
SYR CONTROL 10ML LL (SYRINGE) IMPLANT
TAP CORT 3.5MM SOLID DISP (TRAUMA) ×3 IMPLANT
TOWEL OR 17X24 6PK STRL BLUE (TOWEL DISPOSABLE) ×3 IMPLANT
TOWEL OR 17X26 10 PK STRL BLUE (TOWEL DISPOSABLE) ×3 IMPLANT
TUBE CONNECTING 12'X1/4 (SUCTIONS) ×1
TUBE CONNECTING 12X1/4 (SUCTIONS) ×2 IMPLANT
UNDERPAD 30X30 (UNDERPADS AND DIAPERS) ×3 IMPLANT
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
## 2017-03-05 NOTE — Progress Notes (Signed)
Pt states pain is 10/10 and she is "hurting all over." RN notified MD. MD's office states they will return RN's call.
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
## 2017-03-05 NOTE — Addendum Note (Signed)
Addendum  created 03/05/17 1300 by Colin Benton, CRNA   Intraprocedure Staff edited

## 2017-03-05 NOTE — Anesthesia Procedure Notes (Signed)
Anesthesia Regional Block: Femoral nerve block   Pre-Anesthetic Checklist: ,, timeout performed, Correct Patient, Correct Site, Correct Laterality, Correct Procedure, Correct Position, site marked, Risks and benefits discussed,  Surgical consent,  Pre-op evaluation,  At surgeon's request and post-op pain management  Laterality: Left  Prep: chloraprep       Needles:  Injection technique: Single-shot  Needle Type: Echogenic Needle     Needle Length: 9cm      Additional Needles:   Procedures:,,,, ultrasound used (permanent image in chart),,,,  Narrative:  Start time: 03/05/2017 8:35 AM End time: 03/05/2017 8:45 AM Injection made incrementally with aspirations every 5 mL.  Performed by: Personally  Anesthesiologist: Myrtie Soman, MD  Additional Notes: Patient tolerated the procedure well without complications

## 2017-03-05 NOTE — Transfer of Care (Signed)
Immediate Anesthesia Transfer of Care Note  Patient: Brenda [name]  Procedure(s) Performed: OPEN REDUCTION INTERNAL FIXATION (ORIF) TIBIA/FIBULA FRACTURE (Left Leg Lower)  Patient Location: PACU  Anesthesia Type:GA combined with regional for post-op pain  Level of Consciousness: awake, alert , oriented and patient cooperative  Airway & Oxygen Therapy: Patient Spontanous Breathing and Patient connected to nasal cannula oxygen  Post-op Assessment: Report given to RN and Post -op Vital signs reviewed and stable  Post vital signs: Reviewed and stable  Last Vitals:  Vitals:   03/05/17 0847 03/05/17 1025  BP: (!) 143/63 133/84  Pulse: 75 (!) 106  Resp:  18  Temp:  36.9 C  SpO2:  100%    Last Pain:  Vitals:   03/05/17 0651  TempSrc:   PainSc: 6       Patients Stated Pain Goal: 3 (93/71/69 6789)  Complications: No apparent anesthesia complications

## 2017-03-05 NOTE — Anesthesia Postprocedure Evaluation (Signed)
Anesthesia Post Note  Patient: Brenda [name]  Procedure(s) Performed: OPEN REDUCTION INTERNAL FIXATION (ORIF) TIBIA/FIBULA FRACTURE (Left Leg Lower)     Patient location during evaluation: PACU Anesthesia Type: General Level of consciousness: awake and alert Pain management: pain level controlled Vital Signs Assessment: post-procedure vital signs reviewed and stable Respiratory status: spontaneous breathing, nonlabored ventilation, respiratory function stable and patient connected to nasal cannula oxygen Cardiovascular status: blood pressure returned to baseline and stable Postop Assessment: no apparent nausea or vomiting Anesthetic complications: no    Last Vitals:  Vitals:   03/05/17 1045 03/05/17 1100  BP: (!) 150/72   Pulse: 96   Resp: 16 16  Temp:    SpO2: 100%     Last Pain:  Vitals:   03/05/17 1045  TempSrc:   PainSc: 8                  Carless Slatten S

## 2017-03-05 NOTE — Anesthesia Procedure Notes (Signed)
Procedure Name: Intubation Date/Time: 03/05/2017 8:59 AM Performed by: Colin Benton, CRNA Pre-anesthesia Checklist: Patient identified, Emergency Drugs available, Suction available and Patient being monitored Patient Re-evaluated:Patient Re-evaluated prior to induction Oxygen Delivery Method: Circle system utilized Preoxygenation: Pre-oxygenation with 100% oxygen Induction Type: IV induction Ventilation: Mask ventilation without difficulty Laryngoscope Size: Miller and 2 Grade View: Grade I Tube type: Oral Tube size: 7.0 mm Number of attempts: 1 Airway Equipment and Method: Stylet Placement Confirmation: ETT inserted through vocal cords under direct vision,  positive ETCO2 and breath sounds checked- equal and bilateral Secured at: 21 cm Tube secured with: Tape Dental Injury: Teeth and Oropharynx as per pre-operative assessment

## 2017-03-05 NOTE — Anesthesia Procedure Notes (Signed)
Anesthesia Procedure Image    
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
## 2017-03-05 NOTE — Anesthesia Preprocedure Evaluation (Addendum)
Anesthesia Evaluation  Patient identified by MRN, date of birth, ID band Patient awake    Reviewed: Allergy & Precautions, NPO status , Patient's Chart, lab work & pertinent test results  Airway Mallampati: II  TM Distance: >3 FB Neck ROM: Full    Dental no notable dental hx.    Pulmonary Current Smoker,  Lung CA   Pulmonary exam normal breath sounds clear to auscultation       Cardiovascular negative cardio ROS Normal cardiovascular exam Rhythm:Regular Rate:Normal     Neuro/Psych negative neurological ROS  negative psych ROS   GI/Hepatic negative GI ROS, Neg liver ROS,   Endo/Other  negative endocrine ROS  Renal/GU negative Renal ROS  negative genitourinary   Musculoskeletal negative musculoskeletal ROS (+)   Abdominal   Peds negative pediatric ROS (+)  Hematology negative hematology ROS (+)   Anesthesia Other Findings   Reproductive/Obstetrics negative OB ROS                             Anesthesia Physical Anesthesia Plan  ASA: III  Anesthesia Plan: General   Post-op Pain Management:  Regional for Post-op pain   Induction: Intravenous  PONV Risk Score and Plan: 2 and Ondansetron, Dexamethasone and Treatment may vary due to age or medical condition  Airway Management Planned: Oral ETT  Additional Equipment:   Intra-op Plan:   Post-operative Plan: Extubation in OR  Informed Consent: I have reviewed the patients History and Physical, chart, labs and discussed the procedure including the risks, benefits and alternatives for the proposed anesthesia with the patient or authorized representative who has indicated his/her understanding and acceptance.   Dental advisory given  Plan Discussed with: CRNA and Surgeon  Anesthesia Plan Comments:         Anesthesia Quick Evaluation

## 2017-03-06 ENCOUNTER — Inpatient Hospital Stay (HOSPITAL_COMMUNITY): Payer: No Typology Code available for payment source

## 2017-03-06 DIAGNOSIS — I10 Essential (primary) hypertension: Secondary | ICD-10-CM

## 2017-03-06 DIAGNOSIS — M542 Cervicalgia: Secondary | ICD-10-CM

## 2017-03-06 DIAGNOSIS — M545 Low back pain: Secondary | ICD-10-CM

## 2017-03-06 LAB — BASIC METABOLIC PANEL
Anion gap: 7 (ref 5–15)
BUN: 12 mg/dL (ref 6–20)
CHLORIDE: 105 mmol/L (ref 101–111)
CO2: 23 mmol/L (ref 22–32)
Calcium: 8.3 mg/dL — ABNORMAL LOW (ref 8.9–10.3)
Creatinine, Ser: 0.83 mg/dL (ref 0.44–1.00)
GFR calc Af Amer: >60 mL/min (ref >60)
GFR calc non Af Amer: >60 mL/min (ref >60)
Glucose, Bld: 107 mg/dL — ABNORMAL HIGH (ref 65–99)
POTASSIUM: 3.5 mmol/L (ref 3.5–5.1)
SODIUM: 135 mmol/L (ref 135–145)

## 2017-03-06 LAB — CBC
HCT: 26.6 % — ABNORMAL LOW (ref 36.0–46.0)
HEMOGLOBIN: 8.9 g/dL — AB (ref 12.0–15.0)
MCH: 31.2 pg (ref 26.0–34.0)
MCHC: 33.5 g/dL (ref 30.0–36.0)
MCV: 93.3 fL (ref 78.0–100.0)
Platelets: 197 10*3/uL (ref 150–400)
RBC: 2.85 MIL/uL — AB (ref 3.87–5.11)
RDW: 14.5 % (ref 11.5–15.5)
WBC: 10.2 10*3/uL (ref 4.0–10.5)

## 2017-03-06 MED ORDER — DULOXETINE HCL 60 MG PO CPEP
90.0000 mg | ORAL_CAPSULE | Freq: Every day | ORAL | Status: DC
  Administered 2017-03-06 – 2017-03-08 (×3): 90 mg via ORAL
  Filled 2017-03-06 (×3): qty 1

## 2017-03-06 MED ORDER — GABAPENTIN 100 MG PO CAPS
100.0000 mg | ORAL_CAPSULE | Freq: Three times a day (TID) | ORAL | Status: DC
  Administered 2017-03-06 – 2017-03-08 (×7): 100 mg via ORAL
  Filled 2017-03-06 (×7): qty 1

## 2017-03-06 MED ORDER — NICOTINE 21 MG/24HR TD PT24
21.0000 mg | MEDICATED_PATCH | Freq: Every day | TRANSDERMAL | Status: DC
  Administered 2017-03-06 – 2017-03-08 (×3): 21 mg via TRANSDERMAL
  Filled 2017-03-06 (×3): qty 1

## 2017-03-06 MED ORDER — TRAZODONE HCL 50 MG PO TABS
25.0000 mg | ORAL_TABLET | Freq: Every evening | ORAL | Status: DC | PRN
  Administered 2017-03-06: 25 mg via ORAL
  Filled 2017-03-06: qty 1

## 2017-03-06 NOTE — Progress Notes (Signed)
Orthopedic Tech Progress Note Patient Details:  Brenda [name] 1961-02-04 [id number]  Patient ID: [name], female   DOB: Jul 22, 1960, 56 y.o.   MRN: [id number]   [name] 03/06/2017, 9:45 AM Called in [employer] brace order; spoke with  Answering service

## 2017-03-06 NOTE — Progress Notes (Signed)
   Subjective: Continues to be emotionally labile and in distress. She is very anxious about what is happening and just wants medicines to calm her nerves. States that she needs more dilaudid to help with the pain. She has diffuse chronic pain. She has no one to help her at home and feels she needs to go to rehab. Discussed that we will give the new medications psych added on a chance to work and take it one day at a time. She agrees. All questions and concerns addressed.   Objective: Vital signs in last 24 hours: Vitals:   03/05/17 1620 03/05/17 2009 03/05/17 2335 03/06/17 0450  BP: 140/69 (!) 153/84 (!) 155/66 134/90  Pulse: 83 82 80 77  Resp: 16 18 18 18   Temp: 98.3 F (36.8 C) 98.8 F (37.1 C) 98.5 F (36.9 C) 98.9 F (37.2 C)  TempSrc: Oral Oral Oral Oral  SpO2: 97% 100% 92% 96%  Weight:      Height:       General: Thin female in emotional distress Pulm: Good air movement with no wheezing or crackles  CV: RRR, no murmurs, no rubs Extremities: Able to move her toes without pain.  Psych: Emotionally labile   Assessment/Plan:  Closed Left Tibial Fracture.  - S/p open reduction and internal fixation of the left tibial shaft and closed treatment of the left fibula fracture on 11/23  - Nonweightbearing bearing on the left LE  - PT recommending SNF, social work onboard  - OT evaluation pending  - Pain management and rehab per Ortho  Hypertension  - Continue Lisinopril 20 mg QD and HCTZ 12.5 mg QD   HLD - Can resume simvastatin on discharge   Bipolar / PTSD / Anxiety / Depression  - Pscyhiatry consulted. Appreciate the recommendation.  - Cymbalta 90 mg QD, Gabapentin 100 mg TID for anxiety, Trazodone 25-50 mg QHS PRN for insomnia, and follow-up with outpatient psychiatry   Substance Use Disorder  - Cocaine abuse and opiate abuse  - Previously on Suboxone but has been off of it since August   Stage 1A Adenocarcinoma of the Left Lung. Imaging Stable. F/u with Dr.  Julien Nordmann   Dispo: Discharge per placement and Ortho.   Ina Homes, MD 03/06/2017, 5:21 AM My Pager: [phone]

## 2017-03-06 NOTE — NC FL2 (Signed)
[place] LEVEL OF CARE SCREENING TOOL     IDENTIFICATION  Patient Name: Brenda [name] Birthdate: 07-04-1960 Sex: female Admission Date (Current Location): 03/04/2017  [facility] and [place] Number:  Herbalist and Address:  [facility], [place] [address] [place], [place] [address]      Provider Number: [id number]  Attending Physician Name and Address:  Wylene Simmer, MD  Relative Name and Phone Number:       Current Level of Care: Hospital Recommended Level of Care: [place] Prior Approval Number:    Date Approved/Denied:   PASRR Number:    Discharge Plan: SNF    Current Diagnoses: Patient Active Problem List   Diagnosis Date Noted  . Essential hypertension   . MDD (major depressive disorder), recurrent severe, without psychosis ([place]) 03/05/2017  . Closed left tibial fracture 03/04/2017    Orientation RESPIRATION BLADDER Height & Weight     Self, Time, Situation, Place  Normal Continent Weight: 110 lb (49.9 kg) Height:  5\' 4"  (162.6 cm)  BEHAVIORAL SYMPTOMS/MOOD NEUROLOGICAL BOWEL NUTRITION STATUS      Continent Diet(please see discharge summary. )  AMBULATORY STATUS COMMUNICATION OF NEEDS Skin   Extensive Assist Verbally Surgical wounds(left leg )                       Personal Care Assistance Level of Assistance  Bathing, Feeding, Dressing Bathing Assistance: Maximum assistance Feeding assistance: Limited assistance Dressing Assistance: Maximum assistance     Functional Limitations Info  Sight, Hearing, Speech Sight Info: Adequate Hearing Info: Adequate Speech Info: Adequate    SPECIAL CARE FACTORS FREQUENCY  PT (By licensed PT), OT (By licensed OT)     PT Frequency: 5 times a week  OT Frequency: 5 times a week            Contractures Contractures Info: Not present    Additional Factors Info  Code Status, Allergies Code Status Info: Prior Allergies Info: NKA            Current Medications (03/06/2017):  This is the current hospital active medication list Current Facility-Administered Medications  Medication Dose Route Frequency Provider Last Rate Last Dose  . acetaminophen (TYLENOL) tablet 650 mg  650 mg Oral Q4H PRN Wylene Simmer, MD   650 mg at 03/05/17 1255   Or  . acetaminophen (TYLENOL) suppository 650 mg  650 mg Rectal Q4H PRN Wylene Simmer, MD      . bisacodyl (DULCOLAX) suppository 10 mg  10 mg Rectal Daily PRN Wylene Simmer, MD      . dextrose 5 % and 0.45 % NaCl with KCl 20 mEq/L infusion   Intravenous Continuous Wylene Simmer, MD 75 mL/hr at 03/05/17 1157    . docusate sodium (COLACE) capsule 100 mg  100 mg Oral BID Wylene Simmer, MD   100 mg at 03/06/17 0905  . DULoxetine (CYMBALTA) DR capsule 90 mg  90 mg Oral Daily Nicholes Stairs, MD   90 mg at 03/06/17 0905  . enoxaparin (LOVENOX) injection 40 mg  40 mg Subcutaneous Q24H Wylene Simmer, MD   40 mg at 03/06/17 0350  . gabapentin (NEURONTIN) capsule 100 mg  100 mg Oral TID Nicholes Stairs, MD   100 mg at 03/06/17 1518  . hydrochlorothiazide (MICROZIDE) capsule 12.5 mg  12.5 mg Oral Daily Baggett, Brooke A, RPH   12.5 mg at 03/06/17 0905  . HYDROmorphone (DILAUDID) injection 0.5-2 mg  0.5-2 mg Intravenous Q3H PRN Paralee Cancel, MD   1 mg at 03/06/17 1518  . lisinopril (PRINIVIL,ZESTRIL) tablet 20 mg  20 mg Oral Daily Baggett, Brooke A, RPH   20 mg at 03/06/17 0905  . magnesium citrate solution 1 Bottle  1 Bottle Oral Once PRN Wylene Simmer, MD      . magnesium hydroxide (MILK OF MAGNESIA) suspension 30 mL  30 mL Oral Daily PRN Wylene Simmer, MD      . methocarbamol (ROBAXIN) tablet 500 mg  500 mg Oral Q6H PRN Paralee Cancel, MD   500 mg at 03/06/17 1103  . nicotine (NICODERM CQ - dosed in mg/24 hours) patch 21 mg  21 mg Transdermal Daily Ina Homes, MD   21 mg at 03/06/17 1518  . ondansetron (ZOFRAN) tablet 4 mg  4 mg Oral Q6H PRN Wylene Simmer, MD       Or  . ondansetron [facility])  injection 4 mg  4 mg Intravenous Q6H PRN Wylene Simmer, MD      . oxyCODONE (Oxy IR/ROXICODONE) immediate release tablet 5-10 mg  5-10 mg Oral Q4H PRN Wylene Simmer, MD   10 mg at 03/06/17 1103  . senna (SENOKOT) tablet 8.6 mg  1 tablet Oral BID Wylene Simmer, MD   8.6 mg at 03/06/17 0905  . traZODone (DESYREL) tablet 25 mg  25 mg Oral QHS PRN Nicholes Stairs, MD         Discharge Medications: Please see discharge summary for a list of discharge medications.  Relevant Imaging Results:  Relevant Lab Results:   Additional Information SSN-  Wetzel Bjornstad, LCSWA

## 2017-03-06 NOTE — Progress Notes (Signed)
CSW made aware of potential discharge needs. CSW will follow to ensure that needs are met.      Brenda [name], MSW, [place] [facility] Clinical Social Worker [phone]
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
## 2017-03-06 NOTE — Progress Notes (Signed)
   Subjective:  Patient reports pain as moderate.  She is complaining of pain that prohibits her from getting to the chair this morning.  She states that the splint feels tight on the left foot.  She also states that she has pain when she weight bears on the right leg.  She also feels very stressed out she states that she has not slept since Thursday.  She was disappointed with her interaction with the psychiatrist yesterday.  She currently denies any numbness tingling or otherwise in the legs.  She denies any shortness of breath or chest pain.  Objective:   VITALS:   Vitals:   03/05/17 1620 03/05/17 2009 03/05/17 2335 03/06/17 0450  BP: 140/69 (!) 153/84 (!) 155/66 134/90  Pulse: 83 82 80 77  Resp: 16 18 18 18   Temp: 98.3 F (36.8 C) 98.8 F (37.1 C) 98.5 F (36.9 C) 98.9 F (37.2 C)  TempSrc: Oral Oral Oral Oral  SpO2: 97% 100% 92% 96%  Weight:      Height:       Left leg: Short leg splint in place.  This is a well-padded splint in good repair.  There are no pressure spots that I can palpate on the plantar aspect of the toes.  Otherwise she wiggles her toes.  She endorses sensation intact in the deep and superficial peroneal nerve.  She has capillary refill less than 2 seconds.  She has no pain with passive or active stretch.  Right leg:  Mild knee effusion guarded range of motion secondary to pain.  Otherwise distally her calf is soft and nontender.  No signs of compartment syndrome.  She has neurovascularly intact distally.  Lab Results  Component Value Date   WBC 8.6 03/05/2017   HGB 10.0 (L) 03/05/2017   HCT 30.6 (L) 03/05/2017   MCV 95.3 03/05/2017   PLT 197 03/05/2017   BMET    Component Value Date/Time   NA 143 03/04/2017 1924   K 4.6 03/04/2017 1924   CL 108 03/04/2017 1924   CO2 24 03/04/2017 1915   GLUCOSE 128 (H) 03/04/2017 1924   BUN 21 (H) 03/04/2017 1924   CREATININE 0.80 03/05/2017 1139   CALCIUM 8.8 (L) 03/04/2017 1915   GFRNONAA >60 03/05/2017 1139     GFRAA >60 03/05/2017 1139     Assessment/Plan: 1 Day Post-Op   Principal Problem:   Closed left tibial fracture Active Problems:   MDD (major depressive disorder), recurrent severe, without psychosis ([place])   Essential hypertension   Advance diet Up with therapy -Nonweightbearing on the left lower extremity -Weight-bear as tolerated on the right lower extremity in the brace.  We have a range of motion Bledsoe style brace ordered to be placed when it arrives. -We appreciate the internal medicine team's assistance with her medical issues. -We appreciate psychiatry consultation I have moved forward with their recommendations this morning. -Disposition:  She will likely need a rehab facility at discharge.  We will do Lovenox for DVT prophylaxis.   Nicholes Stairs 03/06/2017, 10:14 AM   Geralynn Rile, MD [phone]

## 2017-03-06 NOTE — Plan of Care (Signed)
  Health Behavior/Discharge Planning: Ability to manage health-related needs will improve 03/06/2017 1122 - Progressing by Williams Che, RN   Clinical Measurements: Ability to maintain clinical measurements within normal limits will improve 03/06/2017 1122 - Progressing by Williams Che, RN   Coping: Level of anxiety will decrease 03/06/2017 1122 - Progressing by Williams Che, RN   Pain Managment: General experience of comfort will improve 03/06/2017 1122 - Progressing by Williams Che, RN   Safety: Ability to remain free from injury will improve 03/06/2017 1122 - Progressing by Williams Che, RN
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
## 2017-03-07 DIAGNOSIS — E785 Hyperlipidemia, unspecified: Secondary | ICD-10-CM

## 2017-03-07 DIAGNOSIS — F332 Major depressive disorder, recurrent severe without psychotic features: Secondary | ICD-10-CM

## 2017-03-07 DIAGNOSIS — C3492 Malignant neoplasm of unspecified part of left bronchus or lung: Secondary | ICD-10-CM

## 2017-03-07 DIAGNOSIS — F419 Anxiety disorder, unspecified: Secondary | ICD-10-CM

## 2017-03-07 DIAGNOSIS — Z79899 Other long term (current) drug therapy: Secondary | ICD-10-CM

## 2017-03-07 DIAGNOSIS — F1110 Opioid abuse, uncomplicated: Secondary | ICD-10-CM

## 2017-03-07 DIAGNOSIS — I10 Essential (primary) hypertension: Secondary | ICD-10-CM

## 2017-03-07 DIAGNOSIS — F1410 Cocaine abuse, uncomplicated: Secondary | ICD-10-CM

## 2017-03-07 LAB — CBC
HCT: 27.5 % — ABNORMAL LOW (ref 36.0–46.0)
HEMOGLOBIN: 9.3 g/dL — AB (ref 12.0–15.0)
MCH: 31.5 pg (ref 26.0–34.0)
MCHC: 33.8 g/dL (ref 30.0–36.0)
MCV: 93.2 fL (ref 78.0–100.0)
PLATELETS: 207 10*3/uL (ref 150–400)
RBC: 2.95 MIL/uL — AB (ref 3.87–5.11)
RDW: 14.1 % (ref 11.5–15.5)
WBC: 10.6 10*3/uL — ABNORMAL HIGH (ref 4.0–10.5)

## 2017-03-07 MED ORDER — IBUPROFEN 200 MG PO TABS: 600.0000 mg | ORAL_TABLET | Freq: Four times a day (QID) | ORAL | Status: DC | PRN

## 2017-03-07 MED ORDER — TRAZODONE HCL 50 MG PO TABS
25.0000 mg | ORAL_TABLET | Freq: Every evening | ORAL | Status: DC | PRN
  Administered 2017-03-08: 50 mg via ORAL
  Filled 2017-03-07 (×2): qty 1

## 2017-03-07 MED ORDER — ENSURE ENLIVE PO LIQD
237.0000 mL | Freq: Two times a day (BID) | ORAL | Status: DC
  Administered 2017-03-07 – 2017-03-08 (×4): 237 mL via ORAL

## 2017-03-07 NOTE — Clinical Social Work Note (Signed)
Clinical Social Work Assessment  Patient Details  Name: Brenda [name] MRN: [id number] Date of Birth: July 30, 1960  Date of referral:  03/07/17               Reason for consult:  Facility Placement                Permission sought to share information with:  Chartered certified accountant granted to share information::  Yes, Verbal Permission Granted  Name::        Agency::  SNF's  Relationship::     Contact Information:     Housing/Transportation Living arrangements for the past 2 months:  Apartment Source of Information:  Patient Patient Interpreter Needed:  None Criminal Activity/Legal Involvement Pertinent to Current Situation/Hospitalization:  No - Comment as needed Significant Relationships:  Adult Children, Friend Lives with:  Self Do you feel safe going back to the place where you live?  No Need for family participation in patient care:  No (Coment)  Care giving concerns:  Pt resides alone and does have adult children that do not live near her. She indicated that she used a cane prior to hospitalization. She receives food stamps. She does not receive any SSI/SSDI benefits.  CSW discussed the barriers with placement as patient is uninsured and will require LOG. Pt amenable to SNF placement locally or outside of area. CSW advised that CSW will try to have her placed locally, but that may be difficult. Pt in agreement and understands that she will need short term rehab no matter where it is located.  Pt confirmed that financial counselor met with her when she was admitted.   Social Worker assessment / plan:  CSW discussed the SNF process and placement barriers for uninsured. CSW will need to f/u on LOG placement as pt uninsured. CSW will obtain PASSR when NCMUST is up. CSW will continue to follow up for disposition.  Employment status:  Unemployed Forensic scientist:  Self Pay (Medicaid Pending) PT Recommendations:  [place] / Referral to  community resources:  [place]  Patient/Family's Response to care:  Patient appreciative of CSW assistance with SNF placement. No issues or concerns identified.  Patient/Family's Understanding of and Emotional Response to Diagnosis, Current Treatment, and Prognosis:  Patient has good understanding of diagnosis, current treatment and prognosis as she understands that she will need short term rehab and cannot return home alone.  Pt desires to stay locally for rehab however understands the barriers that are present with being uninsured. No other issues or concerns identified.  Emotional Assessment Appearance:  Appears stated age Attitude/Demeanor/Rapport:  (Cooperative and pleasant) Affect (typically observed):  Accepting, Appropriate Orientation:  Oriented to Situation, Oriented to  Time, Oriented to Place, Oriented to Self Alcohol / Substance use:  Not Applicable Psych involvement (Current and /or in the community):  No (Comment)  Discharge Needs  Concerns to be addressed:  Discharge Planning Concerns, Care Coordination Readmission within the last 30 days:  No Current discharge risk:  Physical Impairment, Dependent with Mobility Barriers to Discharge:  No Barriers Identified   Normajean Baxter, LCSW 03/07/2017, 11:42 AM

## 2017-03-07 NOTE — Social Work (Addendum)
CSW discussed case with clinical supervisor (Z.Brooks) and CSW was approved for LOG SNF placement.  NCMUST down so CSW unable to obtain PASSR.  CSW will f/u with local SNF's to see if any are willing to take a 30 day LOG.  Elissa Hefty, LCSW Clinical Social Worker [phone]

## 2017-03-07 NOTE — Progress Notes (Signed)
Pt's foley removed at 0630; pt tolerated procedure without difficulty.
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
## 2017-03-07 NOTE — Progress Notes (Addendum)
Brenda [name]  MRN: [id number] DOB/Age: 11-16-1960 56 y.o. Physician: Ander Slade, M.D. 2 Days Post-Op Procedure(s) (LRB): OPEN REDUCTION INTERNAL FIXATION (ORIF) TIBIA/FIBULA FRACTURE (Left)  Subjective: C/o generalized "soreness". Appetite OK, eating lunch at this time, no N/V. Vital Signs Temp:  [98.7 F (37.1 C)-98.9 F (37.2 C)] 98.9 F (37.2 C) (11/25 0539) Pulse Rate:  [68-81] 81 (11/25 0539) Resp:  [16-18] 18 (11/25 0539) BP: (130-174)/(60-71) 146/67 (11/25 0539) SpO2:  [98 %-100 %] 98 % (11/25 0539)  Lab Results Recent Labs    03/06/17 1843 03/07/17 0408  WBC 10.2 10.6*  HGB 8.9* 9.3*  HCT 26.6* 27.5*  PLT 197 207   BMET Recent Labs    03/04/17 1915 03/04/17 1924 03/05/17 1139 03/06/17 1023  NA 140 143  --  135  K 4.6 4.6  --  3.5  CL 108 108  --  105  CO2 24  --   --  23  GLUCOSE 128* 128*  --  107*  BUN 19 21*  --  12  CREATININE 1.00 0.90 0.80 0.83  CALCIUM 8.8*  --   --  8.3*   INR  Date Value Ref Range Status  03/04/2017 0.98  Final     Exam  A and O, splint intact to LLE, adjustment made to relieve minor pressure point. Knee immobilizer intact RLE. N/V intact BLE.  CT right knee confirms non-minimally displaced tibial spine Fx, nondisplaced proximal fibular fx, and segond Fx. Cruciate and collaterals intact.  Plan Per Dr. Doran Durand as previously outlined. SW investigating D/C options  KEVIN M SUPPLE 03/07/2017, 12:18 PM    Contact # [phone]  CT reviewed.  The right proximal tibia intra-articular fracture can be treated successfully in closed fashion.  She can bear weight as tolerated in the hinged ROM brace.  F/u films in two weeks in clinic to ensure reduction is maintained.  Plan ROM locked at 0 deg for the first 6 weeks post injury.

## 2017-03-08 ENCOUNTER — Encounter (HOSPITAL_COMMUNITY): Payer: Self-pay | Admitting: Orthopedic Surgery

## 2017-03-08 DIAGNOSIS — X58XXXA Exposure to other specified factors, initial encounter: Secondary | ICD-10-CM

## 2017-03-08 DIAGNOSIS — S82111A Displaced fracture of right tibial spine, initial encounter for closed fracture: Secondary | ICD-10-CM

## 2017-03-08 DIAGNOSIS — S82491A Other fracture of shaft of right fibula, initial encounter for closed fracture: Secondary | ICD-10-CM

## 2017-03-08 DIAGNOSIS — S82402A Unspecified fracture of shaft of left fibula, initial encounter for closed fracture: Secondary | ICD-10-CM

## 2017-03-08 DIAGNOSIS — F329 Major depressive disorder, single episode, unspecified: Secondary | ICD-10-CM

## 2017-03-08 LAB — CBC
HCT: 27.8 % — ABNORMAL LOW (ref 36.0–46.0)
HEMOGLOBIN: 9.4 g/dL — AB (ref 12.0–15.0)
MCH: 31.8 pg (ref 26.0–34.0)
MCHC: 33.8 g/dL (ref 30.0–36.0)
MCV: 93.9 fL (ref 78.0–100.0)
PLATELETS: 241 10*3/uL (ref 150–400)
RBC: 2.96 MIL/uL — AB (ref 3.87–5.11)
RDW: 14.2 % (ref 11.5–15.5)
WBC: 8.5 10*3/uL (ref 4.0–10.5)

## 2017-03-08 MED ORDER — SENNA 8.6 MG PO TABS: 2.0000 | ORAL_TABLET | Freq: Two times a day (BID) | ORAL | 0 refills | Status: DC

## 2017-03-08 MED ORDER — DOCUSATE SODIUM 100 MG PO CAPS: 100.0000 mg | ORAL_CAPSULE | Freq: Two times a day (BID) | ORAL | 0 refills | Status: DC

## 2017-03-08 MED ORDER — OXYCODONE HCL 5 MG PO TABS: 5.0000 mg | ORAL_TABLET | ORAL | 0 refills | Status: DC | PRN

## 2017-03-08 MED ORDER — ASPIRIN EC 81 MG PO TBEC: 81.0000 mg | DELAYED_RELEASE_TABLET | Freq: Two times a day (BID) | ORAL | 0 refills | Status: DC

## 2017-03-08 NOTE — Social Work (Addendum)
Pt has been accepted to [facility]. CSW discussed with patient who is pleased that she is able to stay in the local area.  CSW will f/u for discharge summary.  CSW also contacted financial counseling to follow up with patient.  Elissa Hefty, LCSW Clinical Social Worker [phone]

## 2017-03-08 NOTE — Progress Notes (Signed)
Subjective: 3 Days Post-Op Procedure(s) (LRB): OPEN REDUCTION INTERNAL FIXATION (ORIF) TIBIA/FIBULA FRACTURE (Left)  Patient reports pain as mild to moderate.  Tolerating POs well.  Admits to flatus.  Denies fever, chills, N/V, SOB, CP.  Objective:   VITALS:  Temp:  [98.5 F (36.9 C)-98.9 F (37.2 C)] 98.9 F (37.2 C) (11/26 0446) Pulse Rate:  [75-81] 81 (11/26 0446) Resp:  [16] 16 (11/26 0446) BP: (132-135)/(59-69) 132/59 (11/26 0446) SpO2:  [100 %] 100 % (11/26 0446)  General: WDWN patient in NAD. Psych:  Appropriate mood and affect. Neuro:  A&O x 3, Moving all extremities, sensation intact to light touch HEENT:  EOMs intact Chest:  Even non-labored respirations Skin: Dressing C/D/I, no rashes or lesions.  T-ROM brace on R LE intact Extremities: warm/dry, mild edema, no erythema or echymosis.  No lymphadenopathy. Pulses: Popliteus 2+ MSK:  ROM: EHL/FHL intact, MMT: able to perform quad set    LABS Recent Labs    03/05/17 1139 03/06/17 1843 03/07/17 0408 03/08/17 0414  HGB 10.0* 8.9* 9.3* 9.4*  WBC 8.6 10.2 10.6* 8.5  PLT 197 197 207 241   Recent Labs    03/05/17 1139 03/06/17 1023  NA  --  135  K  --  3.5  CL  --  105  CO2  --  23  BUN  --  12  CREATININE 0.80 0.83  GLUCOSE  --  107*   No results for input(s): LABPT, INR in the last 72 hours.   Assessment/Plan: 3 Days Post-Op Procedure(s) (LRB): OPEN REDUCTION INTERNAL FIXATION (ORIF) TIBIA/FIBULA FRACTURE (Left) R non-minimally displaced tibial spine fx, nondisplaced fibula fx.  NWB L LE WBAT R LE in T-ROM brace locked out at 0 degrees Up with therapy D/C to SNF when bed available.  Please call when ready and I will place D/C order. Scripts on chart Plan for 2 week outpatient post-op visit with Dr. Doran Durand.  Mechele Claude, PA-C [facility] Office:  [phone]

## 2017-03-08 NOTE — Progress Notes (Signed)
Removed IV, all questions and concerns addressed, Pt not in distress, called facility and gave report to [facility], [place]. Pt to discharge to facility via [facility].
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
## 2017-03-08 NOTE — Social Work (Signed)
Clinical Social Worker facilitated patient discharge including contacting patient family and facility to confirm patient discharge plans.  Clinical information faxed to facility and family agreeable with plan.    CSW arranged ambulance transport via [facility] to [facility].    RN to call [phone] to give report prior to discharge.  Clinical Social Worker will sign off for now as social work intervention is no longer needed. Please consult [place] again if new need arises.  Elissa Hefty, LCSW Clinical Social Worker [phone]

## 2017-03-08 NOTE — Clinical Social Work Placement (Signed)
   CLINICAL SOCIAL WORK PLACEMENT  NOTE  Date:  03/08/2017  Patient Details  Name: Brenda [name] MRN: [id number] Date of Birth: [birth year]  Clinical Social Work is seeking post-discharge placement for this patient at the [place] level of care (*CSW will initial, date and re-position this form in  chart as items are completed):  Yes   Patient/family provided with [place] Work Department's list of facilities offering this level of care within the geographic area requested by the patient (or if unable, by the patient's family).  Yes   Patient/family informed of their freedom to choose among providers that offer the needed level of care, that participate in Medicare, Medicaid or managed care program needed by the patient, have an available bed and are willing to accept the patient.  Yes   Patient/family informed of [facility]'s ownership interest in [facility] and [facility], as well as of the fact that they are under no obligation to receive care at these facilities.  PASRR submitted to EDS on       PASRR number received on 03/08/17     Existing PASRR number confirmed on       FL2 transmitted to all facilities in geographic area requested by pt/family on 03/08/17     FL2 transmitted to all facilities within larger geographic area on       Patient informed that his/her managed care company has contracts with or will negotiate with certain facilities, including the following:        Yes   Patient/family informed of bed offers received.  Patient chooses bed at [facility]     Physician recommends and patient chooses bed at      Patient to be transferred to [facility] on 03/08/17.  Patient to be transferred to facility by [facility]     Patient family notified on 03/08/17 of transfer.  Name of family member notified:  patient responsible for self     PHYSICIAN Please  prepare priority discharge summary, including medications, Please prepare prescriptions, Please sign FL2     Additional Comment:    _______________________________________________ Normajean Baxter, LCSW 03/08/2017, 3:08 PM

## 2017-03-08 NOTE — Discharge Instructions (Signed)
Brenda Simmer, MD [place]  Please read the following information regarding your care after surgery.  Medications  You only need a prescription for the narcotic pain medicine (ex. oxycodone, Percocet, Norco).  All of the other medicines listed below are available over the counter. X Aleve 2 pills twice a day for the first 3 days after surgery. X acetominophen (Tylenol) 650 mg every 4-6 hours as you need for minor to moderate pain X oxycodone as prescribed for severe pain  Narcotic pain medicine (ex. oxycodone, Percocet, Vicodin) will cause constipation.  To prevent this problem, take the following medicines while you are taking any pain medicine. X docusate sodium (Colace) 100 mg twice a day X senna (Senokot) 2 tablets twice a day  X To help prevent blood clots, take a baby aspirin (81 mg) twice a day for two weeks after surgery.  You should also get up every hour while you are awake to move around.    Weight Bearing X Do not bear any weight on the operated leg or foot. X Bear weight has tolerated on right lower extremity with T-ROM brace on.  Dressing X Keep your dressing clean and dry.  Dont put anything (coat hanger, pencil, etc) down inside of it.  If it gets damp, use a hair dryer on the cool setting to dry it.  If it gets soaked, call the office to schedule an appointment for a cast change.    After your dressing, cast or splint is removed; you may shower, but do not soak or scrub the wound.  Allow the water to run over it, and then gently pat it dry.  Swelling It is normal for you to have swelling where you had surgery.  To reduce swelling and pain, keep your toes above your nose for at least 3 days after surgery.  It may be necessary to keep your foot or leg elevated for several weeks.  If it hurts, it should be elevated.  Follow Up Call my office at [phone] when you are discharged from the hospital or surgery center to schedule an appointment to be seen two  weeks after surgery.  Call my office at [phone] if you develop a fever >101.5 F, nausea, vomiting, bleeding from the surgical site or severe pain.

## 2017-03-08 NOTE — NC FL2 (Signed)
[place] LEVEL OF CARE SCREENING TOOL     IDENTIFICATION  Patient Name: Brenda [name] Birthdate: 08/04/1960 Sex: female Admission Date (Current Location): 03/04/2017  [facility] and [place] Number:  Herbalist and Address:  [facility], [place] [address], [place], [place] [address]      Provider Number: [id number]  Attending Physician Name and Address:  Wylene Simmer, MD  Relative Name and Phone Number:       Current Level of Care: Hospital Recommended Level of Care: [place] Prior Approval Number:    Date Approved/Denied:   PASRR Number: [id number]  Discharge Plan: SNF    Current Diagnoses: Patient Active Problem List   Diagnosis Date Noted  . Essential hypertension   . MDD (major depressive disorder), recurrent severe, without psychosis ([place]) 03/05/2017  . Closed left tibial fracture 03/04/2017    Orientation RESPIRATION BLADDER Height & Weight     Self, Time, Situation, Place  Normal Continent Weight: 110 lb (49.9 kg) Height:  5\' 4"  (162.6 cm)  BEHAVIORAL SYMPTOMS/MOOD NEUROLOGICAL BOWEL NUTRITION STATUS      Continent Diet(please see discharge summary. )  AMBULATORY STATUS COMMUNICATION OF NEEDS Skin   Extensive Assist Verbally Surgical wounds(left leg )                       Personal Care Assistance Level of Assistance  Bathing, Feeding, Dressing Bathing Assistance: Maximum assistance Feeding assistance: Limited assistance Dressing Assistance: Maximum assistance     Functional Limitations Info  Sight, Hearing, Speech Sight Info: Adequate Hearing Info: Adequate Speech Info: Adequate    SPECIAL CARE FACTORS FREQUENCY  PT (By licensed PT), OT (By licensed OT)     PT Frequency: 5 times a week  OT Frequency: 5 times a week            Contractures Contractures Info: Not present    Additional Factors Info  Code Status, Allergies Code Status Info: Prior Allergies Info: NKA            Current Medications (03/08/2017):  This is the current hospital active medication list Current Facility-Administered Medications  Medication Dose Route Frequency Provider Last Rate Last Dose  . acetaminophen (TYLENOL) tablet 650 mg  650 mg Oral Q4H PRN Wylene Simmer, MD   650 mg at 03/05/17 1255   Or  . acetaminophen (TYLENOL) suppository 650 mg  650 mg Rectal Q4H PRN Wylene Simmer, MD      . bisacodyl (DULCOLAX) suppository 10 mg  10 mg Rectal Daily PRN Wylene Simmer, MD      . dextrose 5 % and 0.45 % NaCl with KCl 20 mEq/L infusion   Intravenous Continuous Wylene Simmer, MD 75 mL/hr at 03/05/17 1157    . docusate sodium (COLACE) capsule 100 mg  100 mg Oral BID Wylene Simmer, MD   100 mg at 03/08/17 0803  . DULoxetine (CYMBALTA) DR capsule 90 mg  90 mg Oral Daily Nicholes Stairs, MD   90 mg at 03/08/17 0803  . enoxaparin (LOVENOX) injection 40 mg  40 mg Subcutaneous Q24H Wylene Simmer, MD   40 mg at 03/08/17 0757  . feeding supplement (ENSURE ENLIVE) (ENSURE ENLIVE) liquid 237 mL  237 mL Oral BID BM Velna Ochs, MD   237 mL at 03/08/17 0805  . gabapentin (NEURONTIN) capsule 100 mg  100 mg Oral TID Nicholes Stairs, MD   100 mg at 03/08/17 0803  . hydrochlorothiazide (MICROZIDE)  capsule 12.5 mg  12.5 mg Oral Daily Baggett, Brooke A, RPH   12.5 mg at 03/08/17 0804  . HYDROmorphone (DILAUDID) injection 0.5-2 mg  0.5-2 mg Intravenous Q3H PRN Paralee Cancel, MD   1 mg at 03/08/17 0755  . ibuprofen (ADVIL,MOTRIN) tablet 600 mg  600 mg Oral Q6H PRN Velna Ochs, MD      . lisinopril (PRINIVIL,ZESTRIL) tablet 20 mg  20 mg Oral Daily Baggett, Brooke A, RPH   20 mg at 03/08/17 0804  . magnesium citrate solution 1 Bottle  1 Bottle Oral Once PRN Wylene Simmer, MD      . magnesium hydroxide (MILK OF MAGNESIA) suspension 30 mL  30 mL Oral Daily PRN Wylene Simmer, MD      . methocarbamol (ROBAXIN) tablet 500 mg  500 mg Oral Q6H PRN Paralee Cancel, MD   500 mg at 03/08/17 0424  . nicotine  (NICODERM CQ - dosed in mg/24 hours) patch 21 mg  21 mg Transdermal Daily Ina Homes, MD   21 mg at 03/08/17 0806  . ondansetron (ZOFRAN) tablet 4 mg  4 mg Oral Q6H PRN Wylene Simmer, MD       Or  . ondansetron [facility]) injection 4 mg  4 mg Intravenous Q6H PRN Wylene Simmer, MD      . oxyCODONE (Oxy IR/ROXICODONE) immediate release tablet 5-10 mg  5-10 mg Oral Q4H PRN Wylene Simmer, MD   10 mg at 03/08/17 0424  . senna (SENOKOT) tablet 8.6 mg  1 tablet Oral BID Wylene Simmer, MD   8.6 mg at 03/08/17 0803  . traZODone (DESYREL) tablet 25-50 mg  25-50 mg Oral QHS PRN Velna Ochs, MD   50 mg at 03/08/17 0026     Discharge Medications: Please see discharge summary for a list of discharge medications.  Relevant Imaging Results:  Relevant Lab Results:   Additional Information [id number] [place], LCSW

## 2017-03-08 NOTE — Plan of Care (Signed)
  Health Behavior/Discharge Planning: Ability to manage health-related needs will improve 03/08/2017 1024 - Progressing by Williams Che, RN   Activity: Risk for activity intolerance will decrease 03/08/2017 1024 - Progressing by Williams Che, RN   Nutrition: Adequate nutrition will be maintained 03/08/2017 1024 - Progressing by Williams Che, RN   Coping: Level of anxiety will decrease 03/08/2017 1024 - Progressing by Williams Che, RN   Pain Managment: General experience of comfort will improve 03/08/2017 1024 - Progressing by Williams Che, RN   Safety: Ability to remain free from injury will improve 03/08/2017 1024 - Progressing by Williams Che, RN

## 2017-03-08 NOTE — Progress Notes (Signed)
   Subjective: Continues to have pain but overall is doing well. She worked with PT this AM and is looking forward to going to rehab. Right now she is unsure how she is going to learn to walk with a walker. Discussed that we will continue all her current medication that we are managing. All questions and concerns addressed.   Objective: Vital signs in last 24 hours: Vitals:   03/06/17 2005 03/07/17 0539 03/07/17 1412 03/08/17 0446  BP: (!) 174/71 (!) 146/67 135/69 (!) 132/59  Pulse: 78 81 75 81  Resp: 18 18 16 16   Temp: 98.8 F (37.1 C) 98.9 F (37.2 C) 98.5 F (36.9 C) 98.9 F (37.2 C)  TempSrc: Oral Oral Oral Oral  SpO2: 100% 98% 100% 100%  Weight:      Height:       General: Thin female in no acute distress Pulm: Good air movement with no wheezing  CV: RRR, no murmurs, no rubs  Extremities: Able to wiggle her toes  Assessment/Plan:  Closed Left Tibial Fracture.  - S/p open reduction and internal fixation of the left tibial shaft and closed treatment of the left fibula fracture on 11/23  - Nonweightbearing bearing on the left LE - PT & OT recommending SNF, social work onboard  - OT evaluation pending  - Pain management and rehab per Ortho  Neck & Back pain  - MSK in nature  - Continue supportive care - Alternating ice & heat PRN - Robaxin PRN, and ibuprofen   Hypertension  - Continue Lisinopril 20 mg QD and HCTZ 12.5 mg QD   HLD - Can resume simvastatinon discharge   Bipolar / PTSD / Anxiety / Depression  -Pscyhiatry consulted. Appreciate the recommendation.  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 Imaging Stable. F/u with Dr. Julien Nordmann   Dispo: Anticipated discharge pending placement.    Ina Homes, MD 03/08/2017, 4:59 AM My Pager: [phone]

## 2017-03-30 ENCOUNTER — Other Ambulatory Visit (HOSPITAL_BASED_OUTPATIENT_CLINIC_OR_DEPARTMENT_OTHER): Payer: Self-pay

## 2017-03-30 ENCOUNTER — Ambulatory Visit (HOSPITAL_COMMUNITY)
Admission: RE | Admit: 2017-03-30 | Discharge: 2017-03-30 | Disposition: A | Discharge status: Home / Self Care | Payer: No Typology Code available for payment source | Source: Ambulatory Visit | Attending: Internal Medicine | Admitting: Internal Medicine

## 2017-03-30 ENCOUNTER — Encounter (HOSPITAL_COMMUNITY): Payer: Self-pay

## 2017-03-30 DIAGNOSIS — M4854XA Collapsed vertebra, not elsewhere classified, thoracic region, initial encounter for fracture: Secondary | ICD-10-CM | POA: Insufficient documentation

## 2017-03-30 DIAGNOSIS — C3432 Malignant neoplasm of lower lobe, left bronchus or lung: Secondary | ICD-10-CM | POA: Insufficient documentation

## 2017-03-30 DIAGNOSIS — X58XXXA Exposure to other specified factors, initial encounter: Secondary | ICD-10-CM | POA: Insufficient documentation

## 2017-03-30 DIAGNOSIS — J439 Emphysema, unspecified: Secondary | ICD-10-CM | POA: Insufficient documentation

## 2017-03-30 DIAGNOSIS — Z85118 Personal history of other malignant neoplasm of bronchus and lung: Secondary | ICD-10-CM

## 2017-03-30 DIAGNOSIS — I700 Atherosclerosis of aorta: Secondary | ICD-10-CM | POA: Insufficient documentation

## 2017-03-30 DIAGNOSIS — S2232XA Fracture of one rib, left side, initial encounter for closed fracture: Secondary | ICD-10-CM | POA: Insufficient documentation

## 2017-03-30 LAB — CBC WITH DIFFERENTIAL/PLATELET
BASO%: 0.8 % (ref 0.0–2.0)
Basophils Absolute: 0.1 10*3/uL (ref 0.0–0.1)
EOS ABS: 0.9 10*3/uL — AB (ref 0.0–0.5)
EOS%: 10.1 % — ABNORMAL HIGH (ref 0.0–7.0)
HCT: 33.4 % — ABNORMAL LOW (ref 34.8–46.6)
HGB: 11.3 g/dL — ABNORMAL LOW (ref 11.6–15.9)
LYMPH%: 25.7 % (ref 14.0–49.7)
MCH: 32.8 pg (ref 25.1–34.0)
MCHC: 34 g/dL (ref 31.5–36.0)
MCV: 96.5 fL (ref 79.5–101.0)
MONO#: 0.5 10*3/uL (ref 0.1–0.9)
MONO%: 6.1 % (ref 0.0–14.0)
NEUT#: 5 10*3/uL (ref 1.5–6.5)
NEUT%: 57.3 % (ref 38.4–76.8)
PLATELETS: 322 10*3/uL (ref 145–400)
RBC: 3.46 10*6/uL — AB (ref 3.70–5.45)
RDW: 15.3 % — ABNORMAL HIGH (ref 11.2–14.5)
WBC: 8.7 10*3/uL (ref 3.9–10.3)
lymph#: 2.2 10*3/uL (ref 0.9–3.3)

## 2017-03-30 LAB — COMPREHENSIVE METABOLIC PANEL
ALT: 52 U/L (ref 0–55)
ANION GAP: 9 meq/L (ref 3–11)
AST: 55 U/L — ABNORMAL HIGH (ref 5–34)
Albumin: 3.7 g/dL (ref 3.5–5.0)
Alkaline Phosphatase: 129 U/L (ref 40–150)
BUN: 32.1 mg/dL — AB (ref 7.0–26.0)
CO2: 24 meq/L (ref 22–29)
Calcium: 9.7 mg/dL (ref 8.4–10.4)
Chloride: 105 mEq/L (ref 98–109)
Creatinine: 0.8 mg/dL (ref 0.6–1.1)
EGFR: >60 mL/min/{1.73_m2} (ref >60)
GLUCOSE: 97 mg/dL (ref 70–140)
POTASSIUM: 4.6 meq/L (ref 3.5–5.1)
SODIUM: 138 meq/L (ref 136–145)
Total Bilirubin: 0.34 mg/dL (ref 0.20–1.20)
Total Protein: 7.5 g/dL (ref 6.4–8.3)

## 2017-03-30 MED ORDER — IOPAMIDOL (ISOVUE-300) INJECTION 61%
75.0000 mL | Freq: Once | INTRAVENOUS | Status: AC | PRN
  Administered 2017-03-30: 75 mL via INTRAVENOUS

## 2017-03-30 MED ORDER — IOPAMIDOL (ISOVUE-300) INJECTION 61%
INTRAVENOUS | Status: AC
  Administered 2017-03-30: 75 mL via INTRAVENOUS
  Filled 2017-03-30: qty 75

## 2017-04-01 ENCOUNTER — Ambulatory Visit (HOSPITAL_BASED_OUTPATIENT_CLINIC_OR_DEPARTMENT_OTHER): Payer: Self-pay | Admitting: Internal Medicine

## 2017-04-01 VITALS — BP 168/80 | HR 74 | Temp 98.5°F | Resp 19 | Ht 64.0 in

## 2017-04-01 DIAGNOSIS — I10 Essential (primary) hypertension: Secondary | ICD-10-CM

## 2017-04-01 DIAGNOSIS — Z85118 Personal history of other malignant neoplasm of bronchus and lung: Secondary | ICD-10-CM

## 2017-04-01 DIAGNOSIS — C3432 Malignant neoplasm of lower lobe, left bronchus or lung: Secondary | ICD-10-CM

## 2017-04-01 DIAGNOSIS — C3490 Malignant neoplasm of unspecified part of unspecified bronchus or lung: Secondary | ICD-10-CM

## 2017-04-01 NOTE — Progress Notes (Signed)
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 level. Electronically Signed   By: Lajean Manes M.D.   On: 03/04/2017 20:29    ASSESSMENT AND PLAN:  This is a very pleasant 56 years old white female with stage IA non-small cell lung cancer, adenocarcinoma status post left lower lobe segmentectomy with lymph node sampling and has been on observation since that time. The patient has no concerning complaints today except for the recent multiple fractions to her legs and she is currently undergoing rehabilitation. Her recent CT scan of the chest showed no evidence for disease recurrence. I discussed the scan results with the patient and recommended for her to continue on observation. She will come back for follow-up visit in 6 months for evaluation with repeat CT scan of the chest for restaging of her disease. For hypertension, she was advised to take her blood pressure medication as prescribed and to consult with her primary care physician if needed for adjustment of her medications. The patient was advised to call immediately if she has any concerning symptoms in the interval. The patient voices understanding of current disease status and treatment options and is in agreement with the current care plan. All  questions were answered. The patient knows to call the clinic with any problems, questions or concerns. We can certainly see the patient much sooner if necessary. I spent 10 minutes counseling the patient face to face. The total time spent in the appointment was 15 minutes. Disclaimer: This note was dictated with voice recognition software. Similar sounding words can inadvertently be transcribed and may not be corrected upon review.

## 2017-04-03 ENCOUNTER — Encounter: Payer: Self-pay | Admitting: Internal Medicine

## 2017-04-17 IMAGING — CT CT CHEST W/O CM
2 of 3 series · 15 of 36 positions shown, 18 images · non-contrast
Comparison: 08/22/2013 and prior abdominal/pelvic CTs dating back
to 02/16/2011. 07/07/2014 and prior radiographs.

CLINICAL DATA: 54-year-old female-followup left lower lobe
pulmonary nodule. Smoking history.

EXAM:
CT CHEST WITHOUT CONTRAST
TECHNIQUE: Multidetector CT imaging of the chest was performed following the
standard protocol without IV contrast.

[Series 2: thorax 5.0 i31f 1 · axial · 0.65mm/px · z∈[+1071,+1336]mm · 12 of 63 slices shown, 15 images]
[im 5/63  mediastinal]
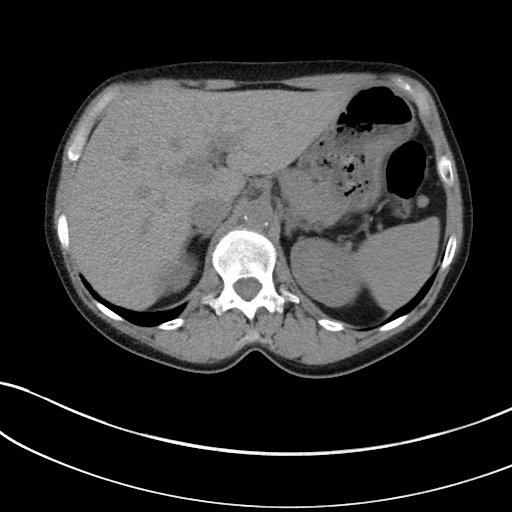
[im 5/63  lung]
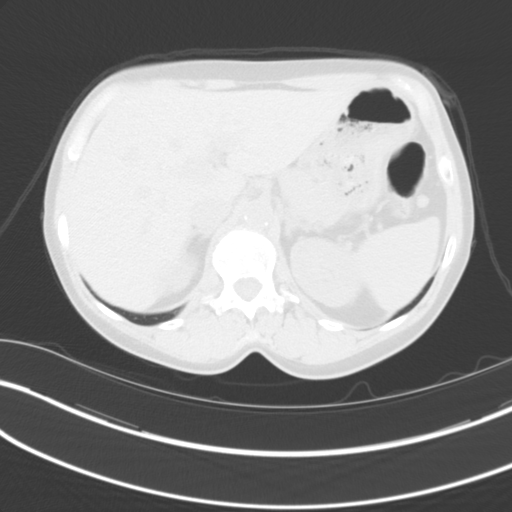
[im 10/63  lung]
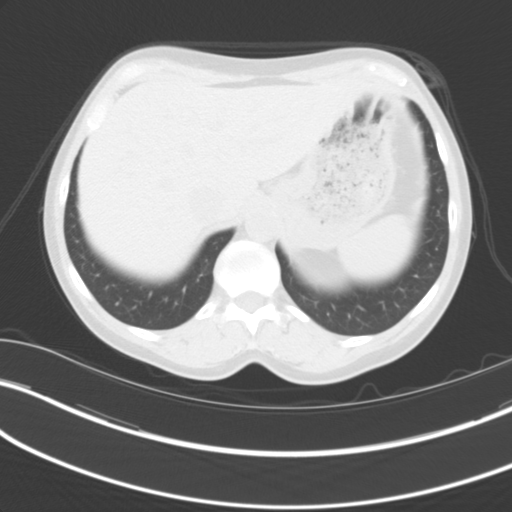
[im 14/63  lung]
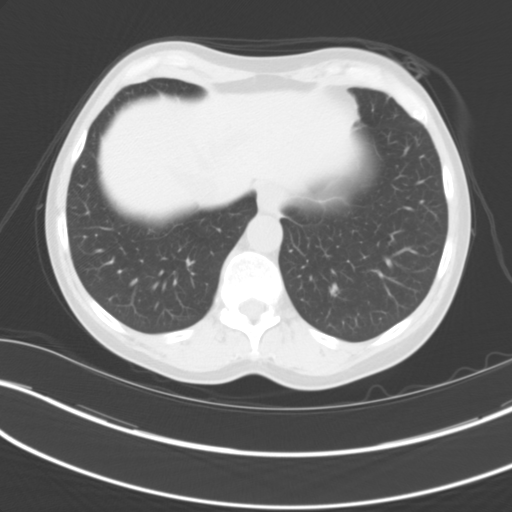
[im 19/63  lung]
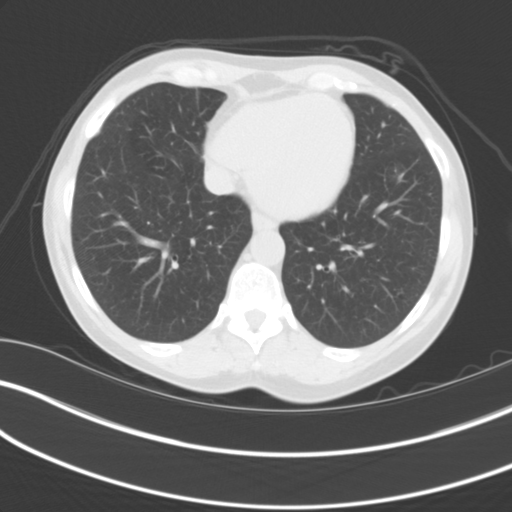
[im 23/63  mediastinal]
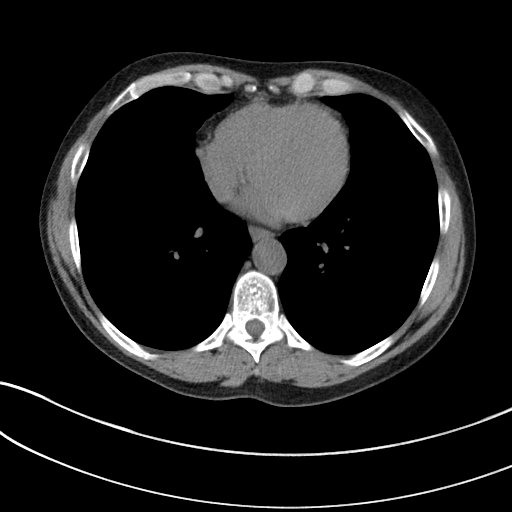
[im 23/63  lung]
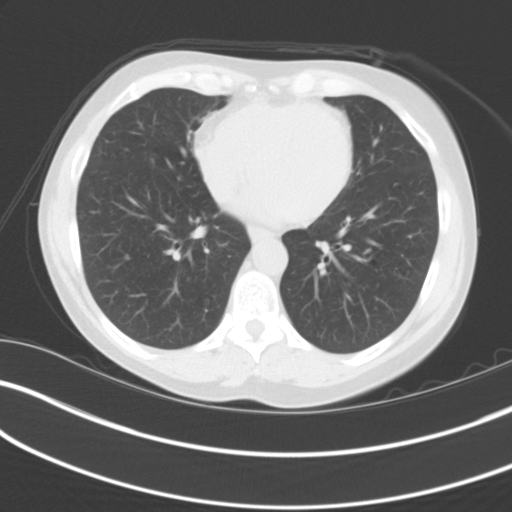
[im 28/63  lung]
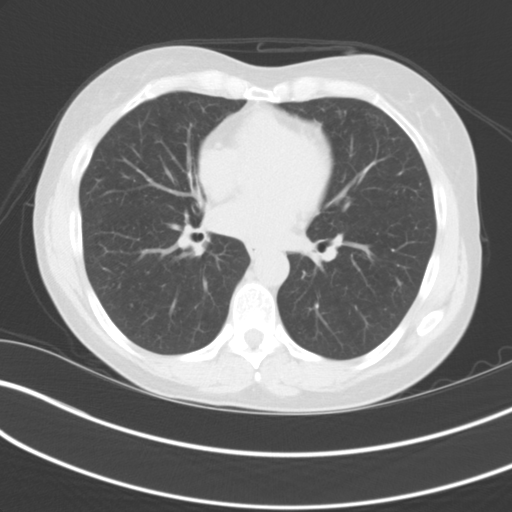
[im 35/63  lung]
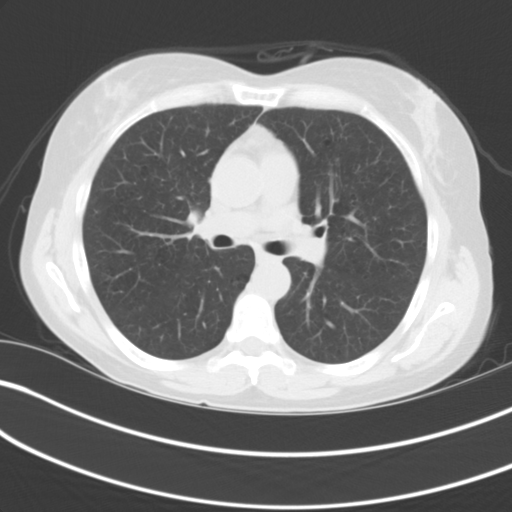
[im 40/63  lung]
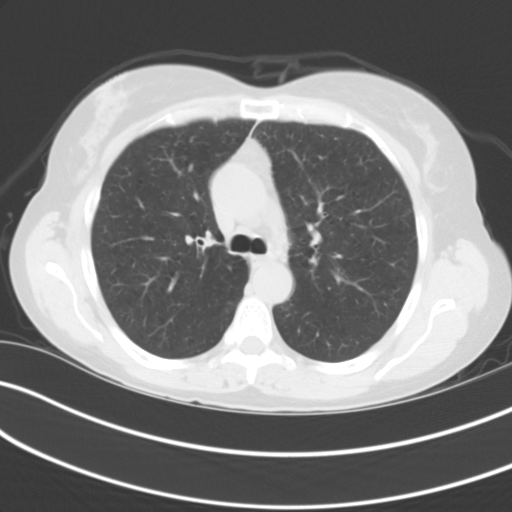
[im 44/63  mediastinal]
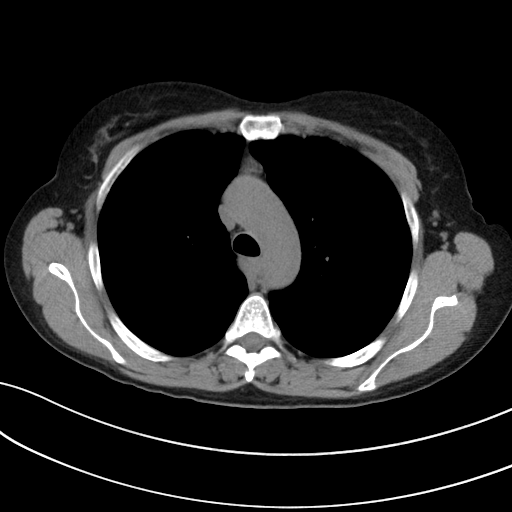
[im 44/63  lung]
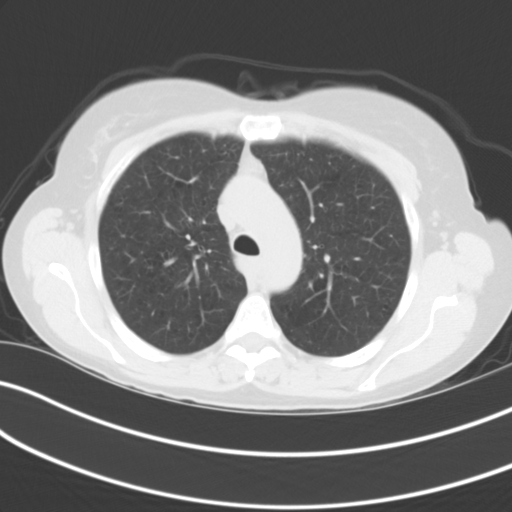
[im 49/63  lung]
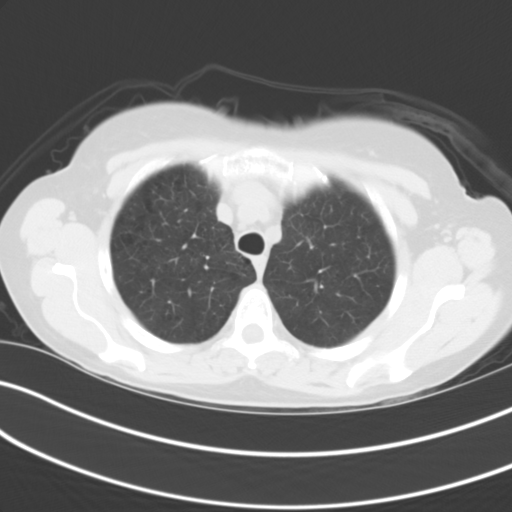
[im 53/63  lung]
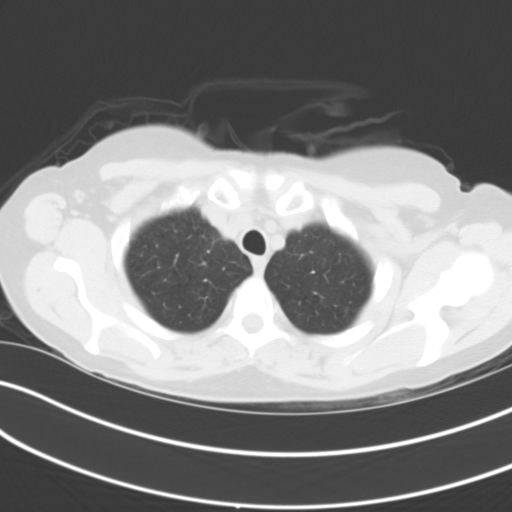
[im 58/63  lung]
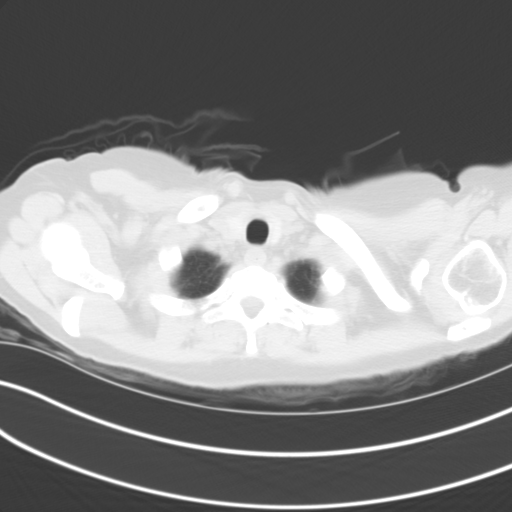

[Series 5: coronal · coronal · 0.61mm/px · 3 of 73 slices shown]
[im 15/73  lung]
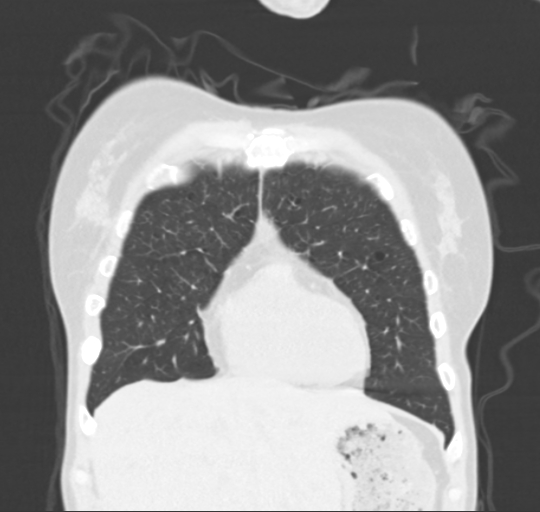
[im 29/73  lung]
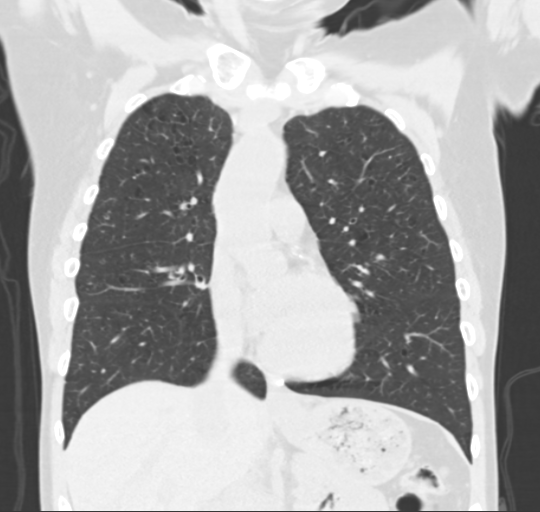
[im 44/73  lung]
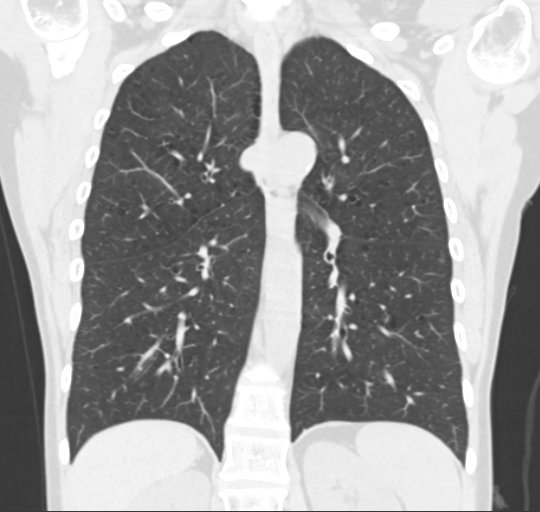

[15 of 36 positions shown; findings below may reference images not displayed]

FINDINGS: Mediastinum/Nodes: The heart and great vessels are unremarkable
except for mild coronary artery calcifications. No enlarged lymph
nodes are identified. There is no evidence of mediastinal mass or
pericardial effusion.

Lungs/Pleura: A 10 x 7 x 13 mm part solid ground-glass nodule within
the left lower lobe abutting the major fissure (Image 43) has
slightly increased in size.

Mild centrilobular emphysema is noted.

No other suspicious nodule, mass, airspace disease, consolidation or
pleural effusion identified.

Upper abdomen: A left renal cyst is present.

Musculoskeletal: No acute or suspicious abnormalities.
IMPRESSION: Slightly enlarging 10 x 7 x 13 mm part solid ground-glass nodule
within the left lower lobe, suspicious for bronchogenic malignancy.
PET-CT is recommended for further evaluation.

Mild centrilobular emphysema.

Coronary artery disease.

## 2017-04-21 ENCOUNTER — Encounter: Payer: Self-pay | Admitting: Internal Medicine

## 2017-05-08 IMAGING — CT NM PET TUM IMG INITIAL (PI) SKULL BASE T - THIGH
7 series · 25 of 25 positions shown · non-contrast
Comparison: 08/23/2008

CLINICAL DATA: Initial treatment strategy for pulmonary nodule.

EXAM:
NUCLEAR MEDICINE PET SKULL BASE TO THIGH
TECHNIQUE: 5.8 mCi F-18 FDG was injected intravenously. Full-ring PET imaging
was performed from the skull base to thigh after the radiotracer. CT
data was obtained and used for attenuation correction and anatomic
localization.
FASTING BLOOD GLUCOSE:  Value: 113 mg/dl

[Series 3: pet sk_thigh ac · axial · 5.0mm · 4.07mm/px · z∈[-816,+16]mm · 6 of 209 slices shown]
[im 1/209]
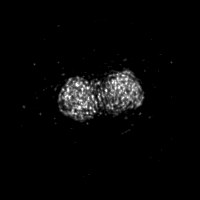
[im 42/209]
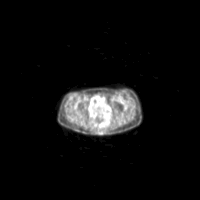
[im 84/209]
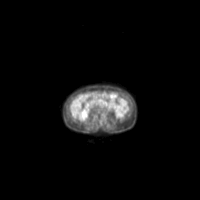
[im 125/209]
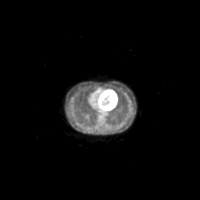
[im 167/209]
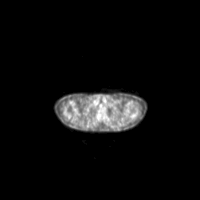
[im 209/209]
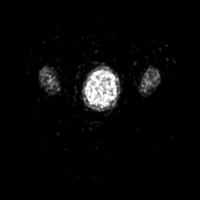

[Series 4: ct sk_thigh 5.0 b31f · axial · 5.0mm · 0.77mm/px · z∈[-816,+16]mm · 5 of 209 slices shown]
[im 1/209]
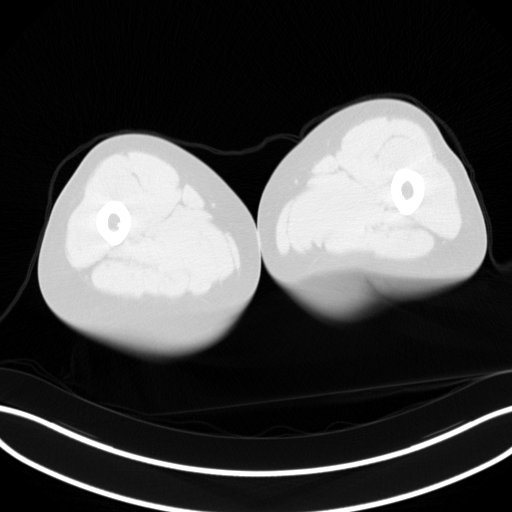
[im 53/209]
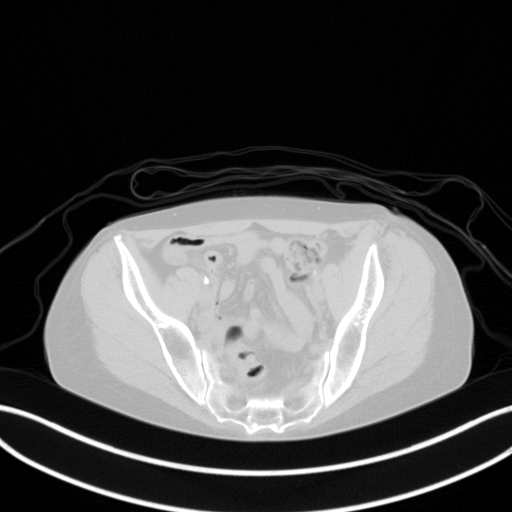
[im 105/209]
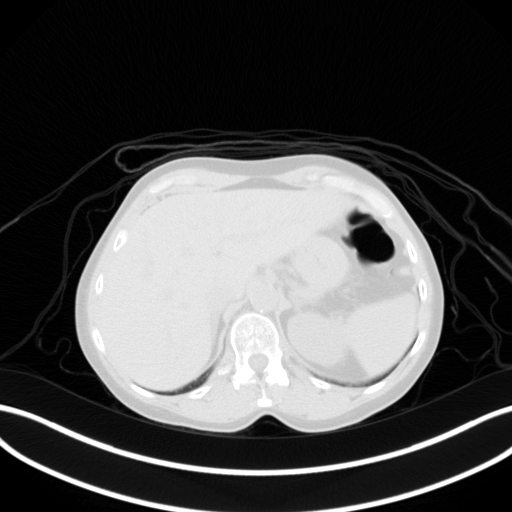
[im 157/209]
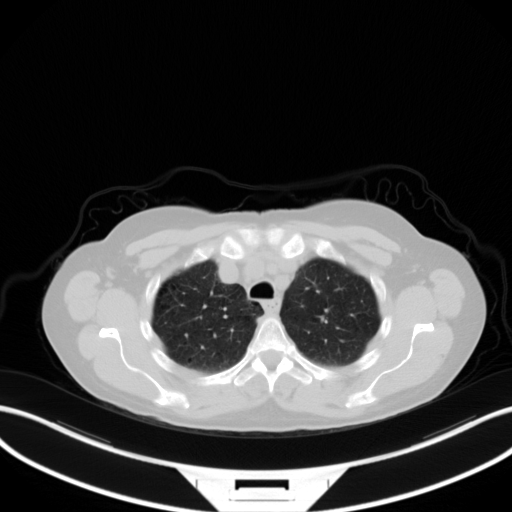
[im 209/209  brain]
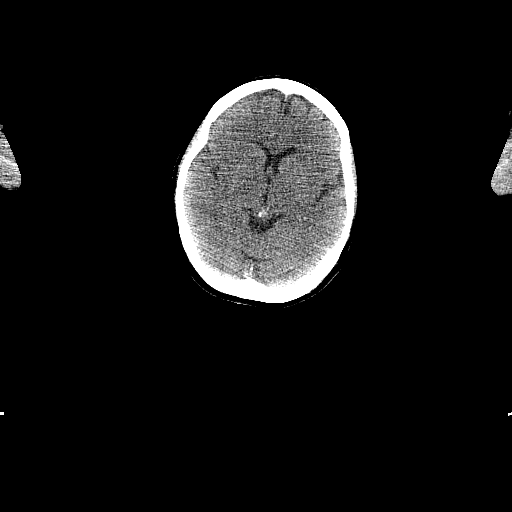

[Series 7: pet sk_thigh nac · axial · 5.0mm · 4.07mm/px · z∈[-816,+16]mm · 5 of 209 slices shown]
[im 1/209]
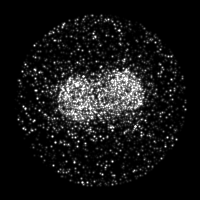
[im 53/209]
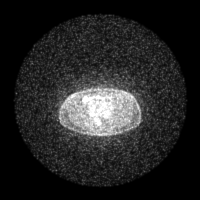
[im 105/209]
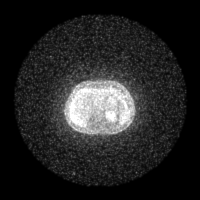
[im 157/209]
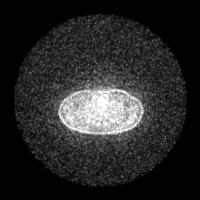
[im 209/209]
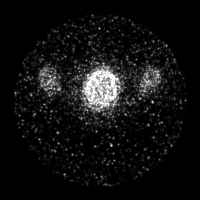

[Series 8: ct sk_thigh 5.0 b70f (id)_bone · axial · 5.0mm · 0.57mm/px · z∈[-420,-120]mm · 2 of 76 slices shown]
[im 1/76  bone]
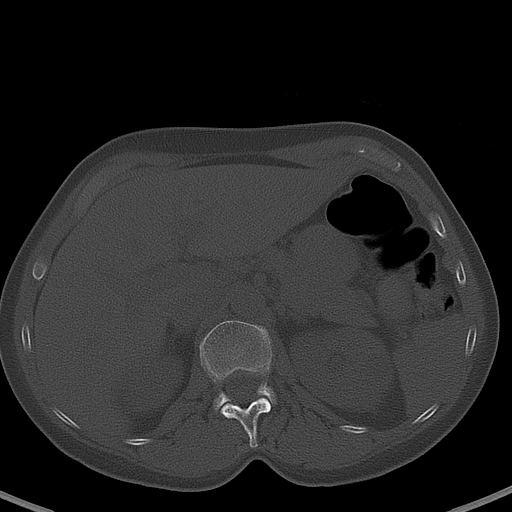
[im 76/76  bone]
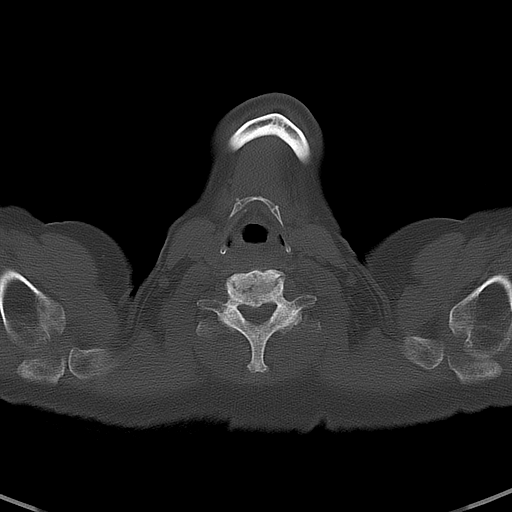

[Series 604: mip collection<mip range> · coronal · 1.73mm/px · 1 of 32 slices shown]
[im 1/32]
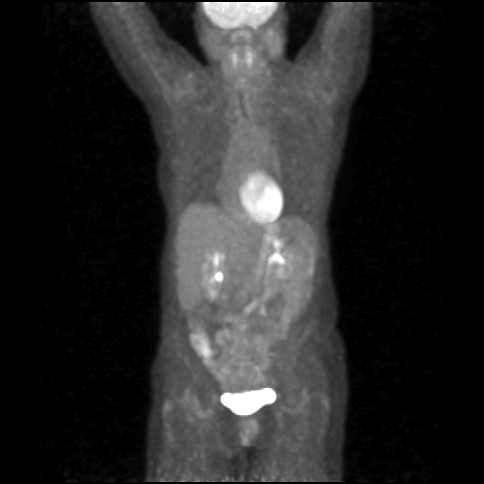

[Series 605: range-ct sk_thigh 5.0 (id)<alpha range> · 1 of 55 slices shown (1 of 2)]
[im 1/55]
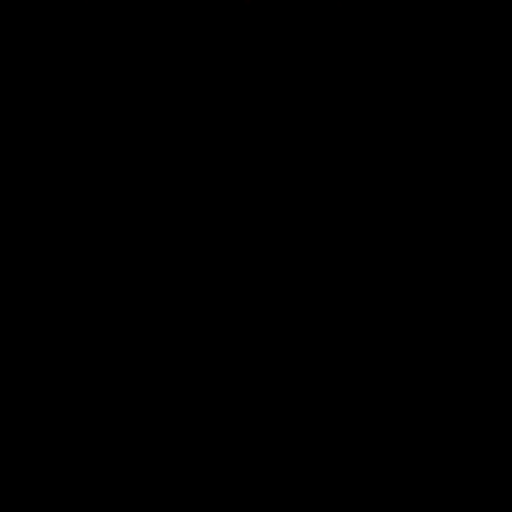

[Series 606: range-ct sk_thigh 5.0 (id)<alpha range> · 5 of 202 slices shown (2 of 2)]
[im 1/202]
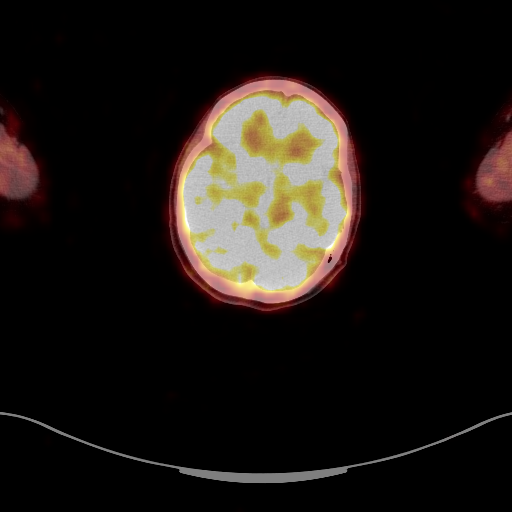
[im 51/202]
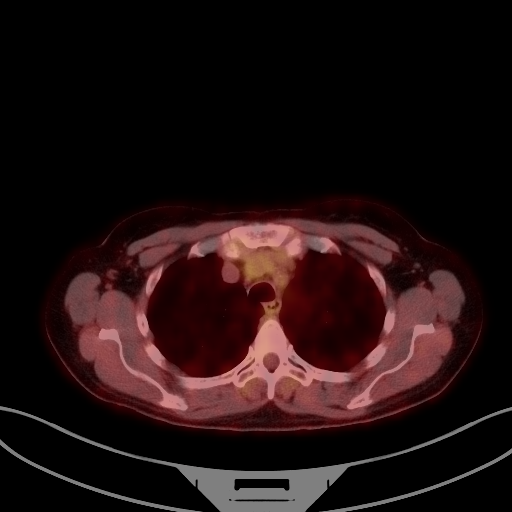
[im 101/202]
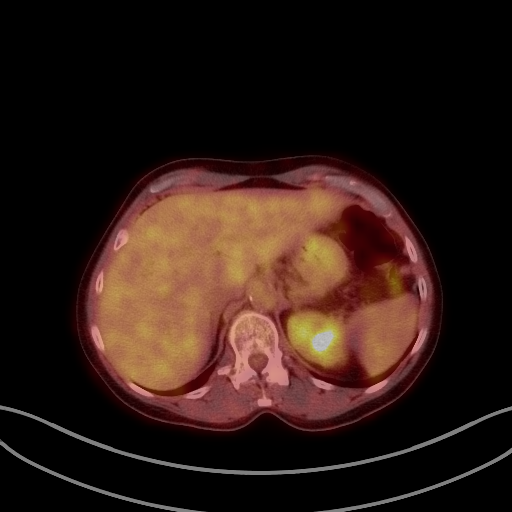
[im 151/202]
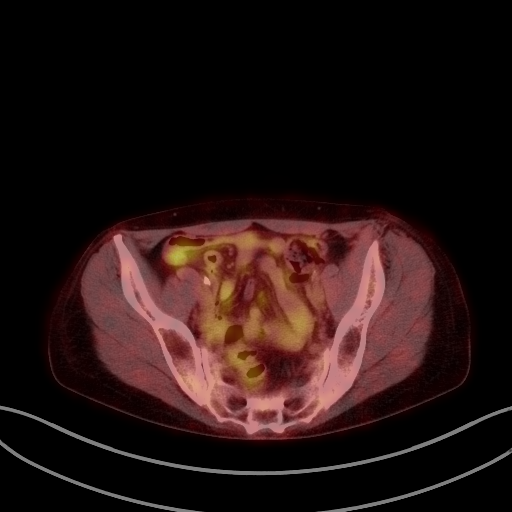
[im 202/202]
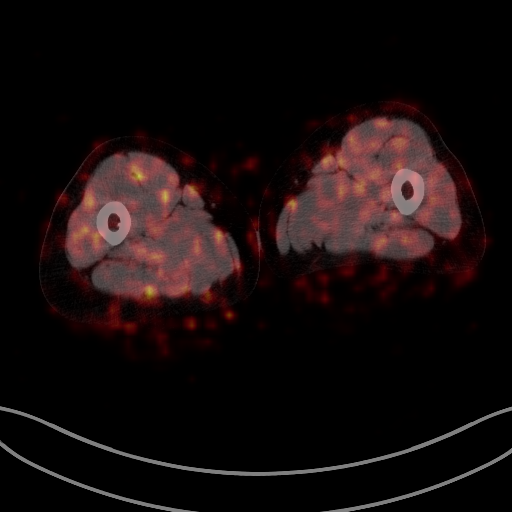

[25 of 25 positions shown; findings below may reference images not displayed]

FINDINGS: NECK

No hypermetabolic lymph nodes in the neck.

CHEST

The ground-glass pulmonary nodule within the left lower lobe
measures 1 cm. This is unchanged in size when compared with
08/22/2013. An equivocal central solid component is associated with
this structure measuring 3 mm, image 53/series 8. The SUV max
associated with this nodule is equal to 0.75. No hypermetabolic
mediastinal or hilar nodes identified.

ABDOMEN/PELVIS

No abnormal hypermetabolic activity within the liver, pancreas,
adrenal glands, or spleen. No hypermetabolic lymph nodes in the
abdomen or pelvis.

SKELETON

No focal hypermetabolic activity to suggest skeletal metastasis.
IMPRESSION: 1. Sub solid, predominantly ground-glass nodule within the left
lower lobe measures 1 cm. There is no malignant range FDG uptake
associated with this nodule. Annual surveillance for minimum of 3
years would be recommended. The next followup examination should be
obtained on 11/20/2015. Initial follow-up by chest CT without
contrast is recommended in 3 months to confirm persistence. This
recommendation follows the consensus statement: Recommendations for
the Management of Subsolid Pulmonary Nodules Detected at CT: A
Statement from the [HOSPITAL] as published in Radiology

## 2017-07-09 IMAGING — CR DG CHEST 2V
2 series · 2 of 2 positions shown · non-contrast
Comparison: None.

CLINICAL DATA: Preop for left lung surgery.  History of smoking.

EXAM:
CHEST  2 VIEW

[w chest pa]
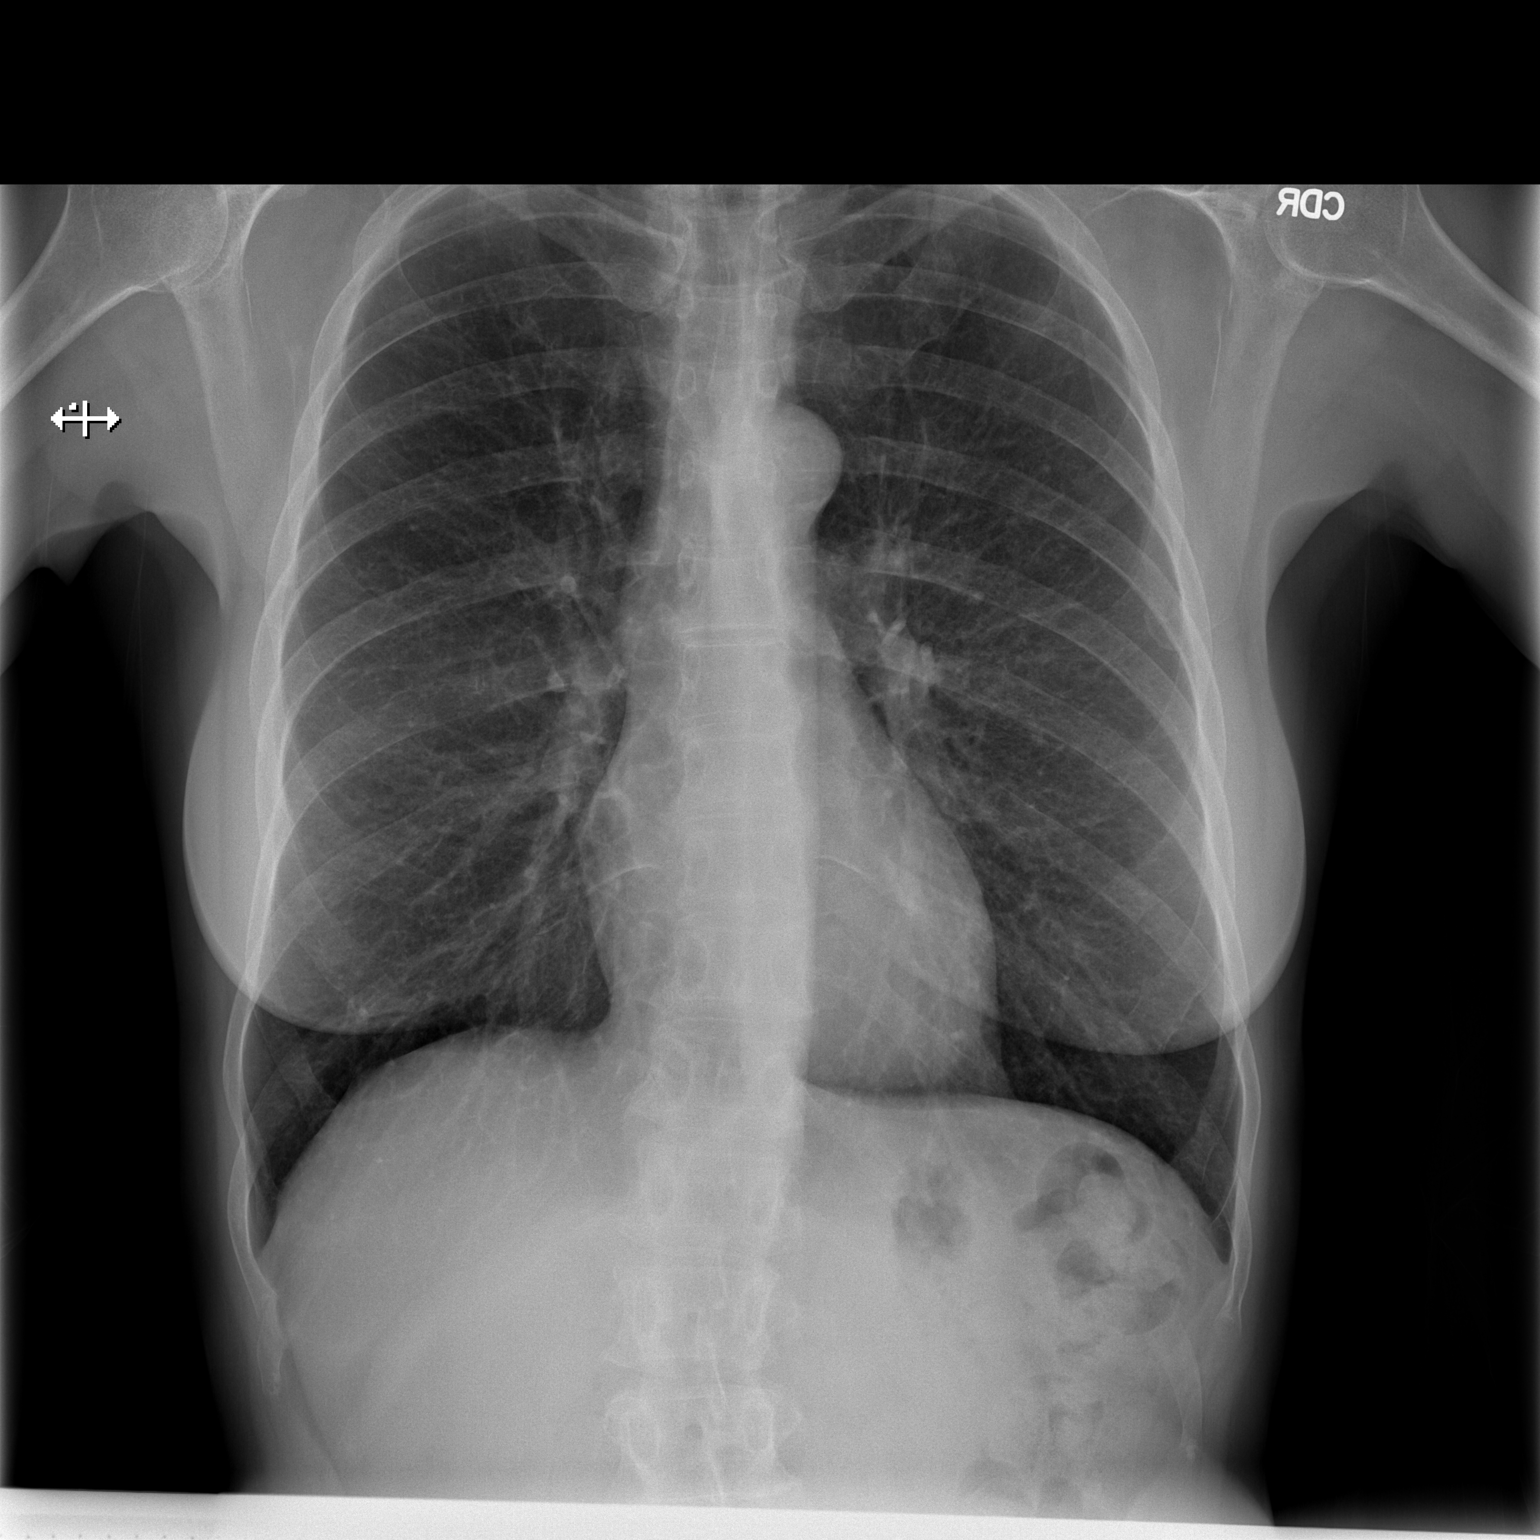

[w chest lat]
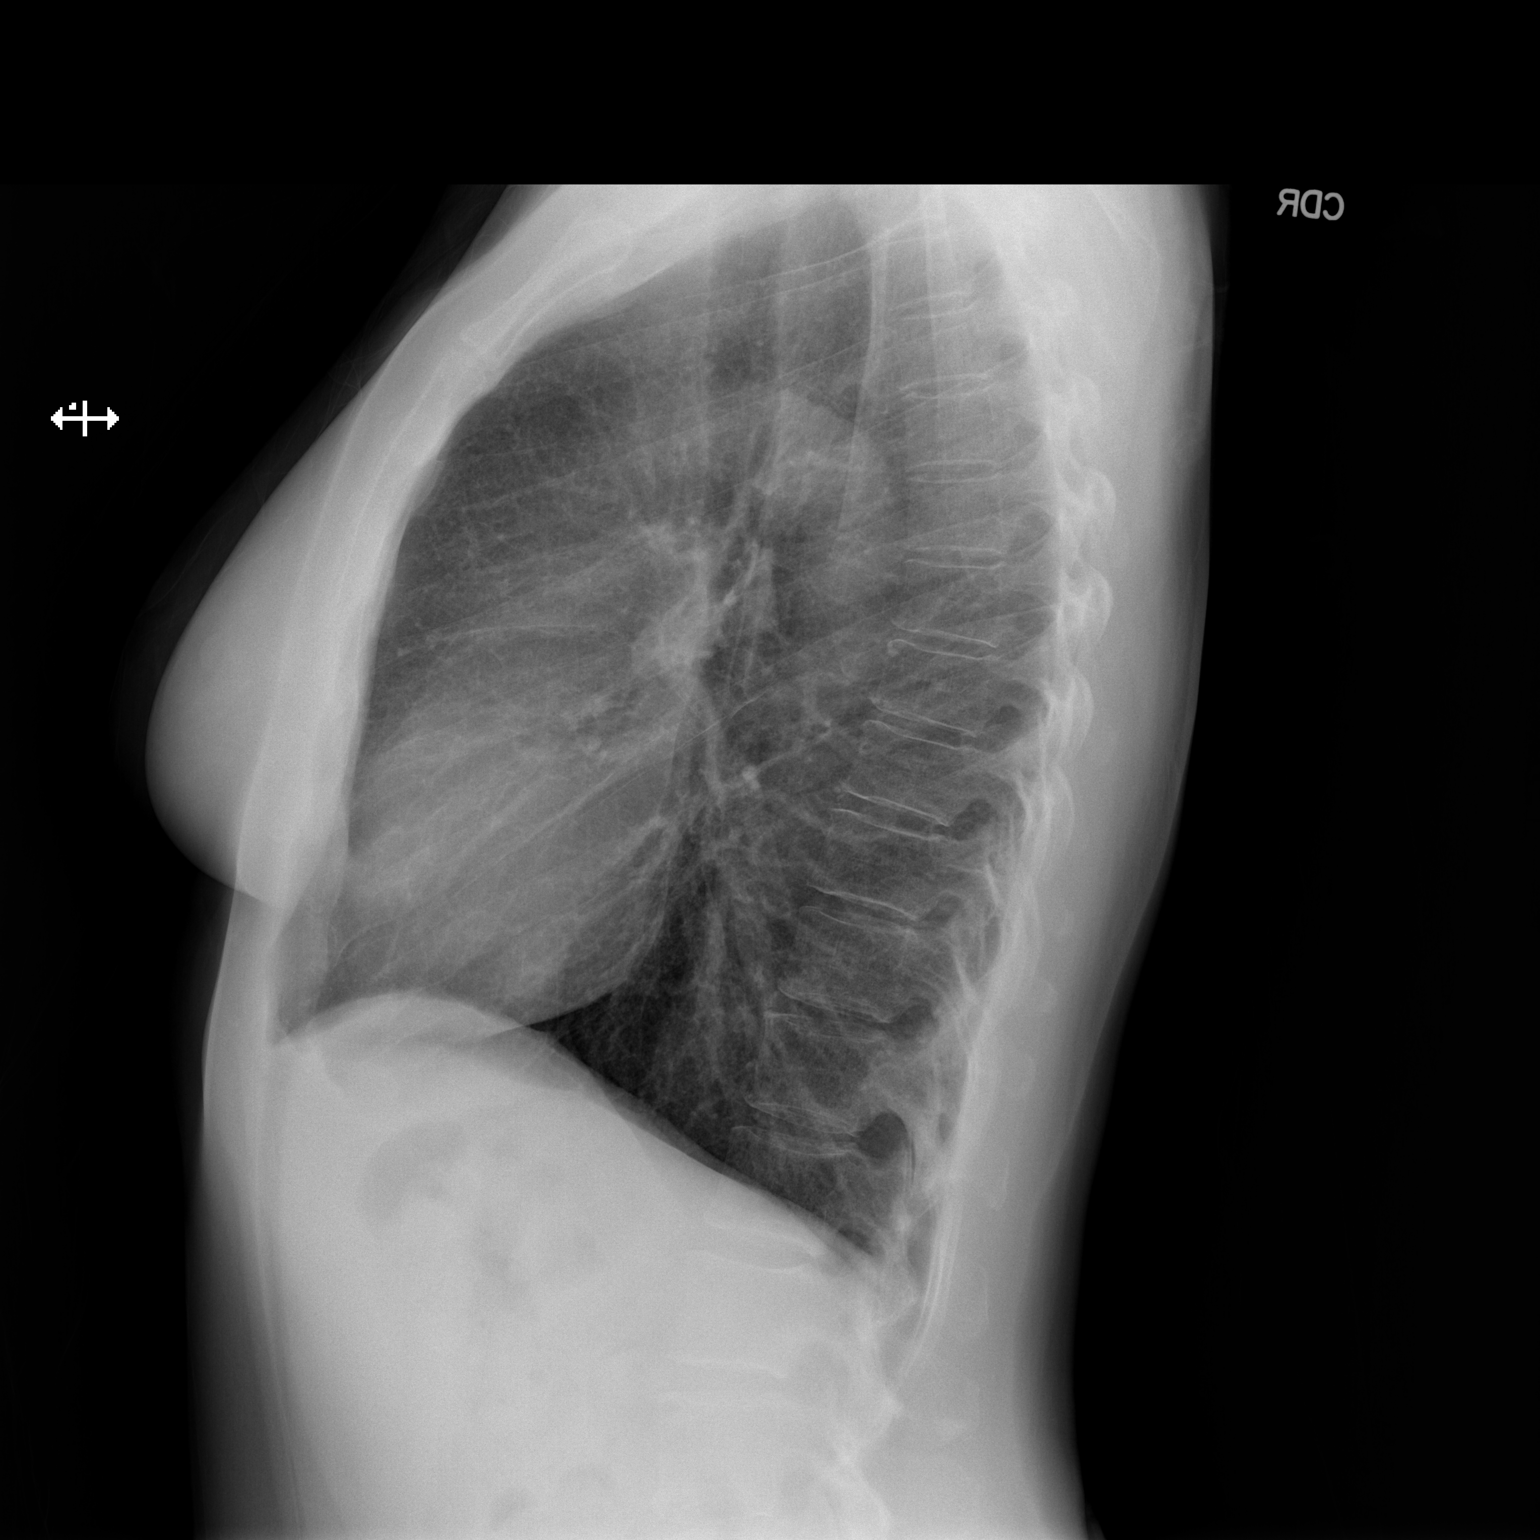

[2 of 2 positions shown; findings below may reference images not displayed]

FINDINGS: Cardiac silhouette is normal in size and configuration. Normal
mediastinal and hilar contours.

Lungs are clear.  No pleural effusion or pneumothorax.

Skeletal structures are unremarkable.
IMPRESSION: 1. No acute cardiopulmonary disease.
2. Hyperexpanded lungs consistent with COPD, stable.

## 2017-07-11 IMAGING — CR DG CHEST 1V PORT
1 series · 1 of 1 positions shown · non-contrast
Comparison: 02/11/2015

CLINICAL DATA: Left VATS, left chest tube

EXAM:
PORTABLE CHEST 1 VIEW

[AP]
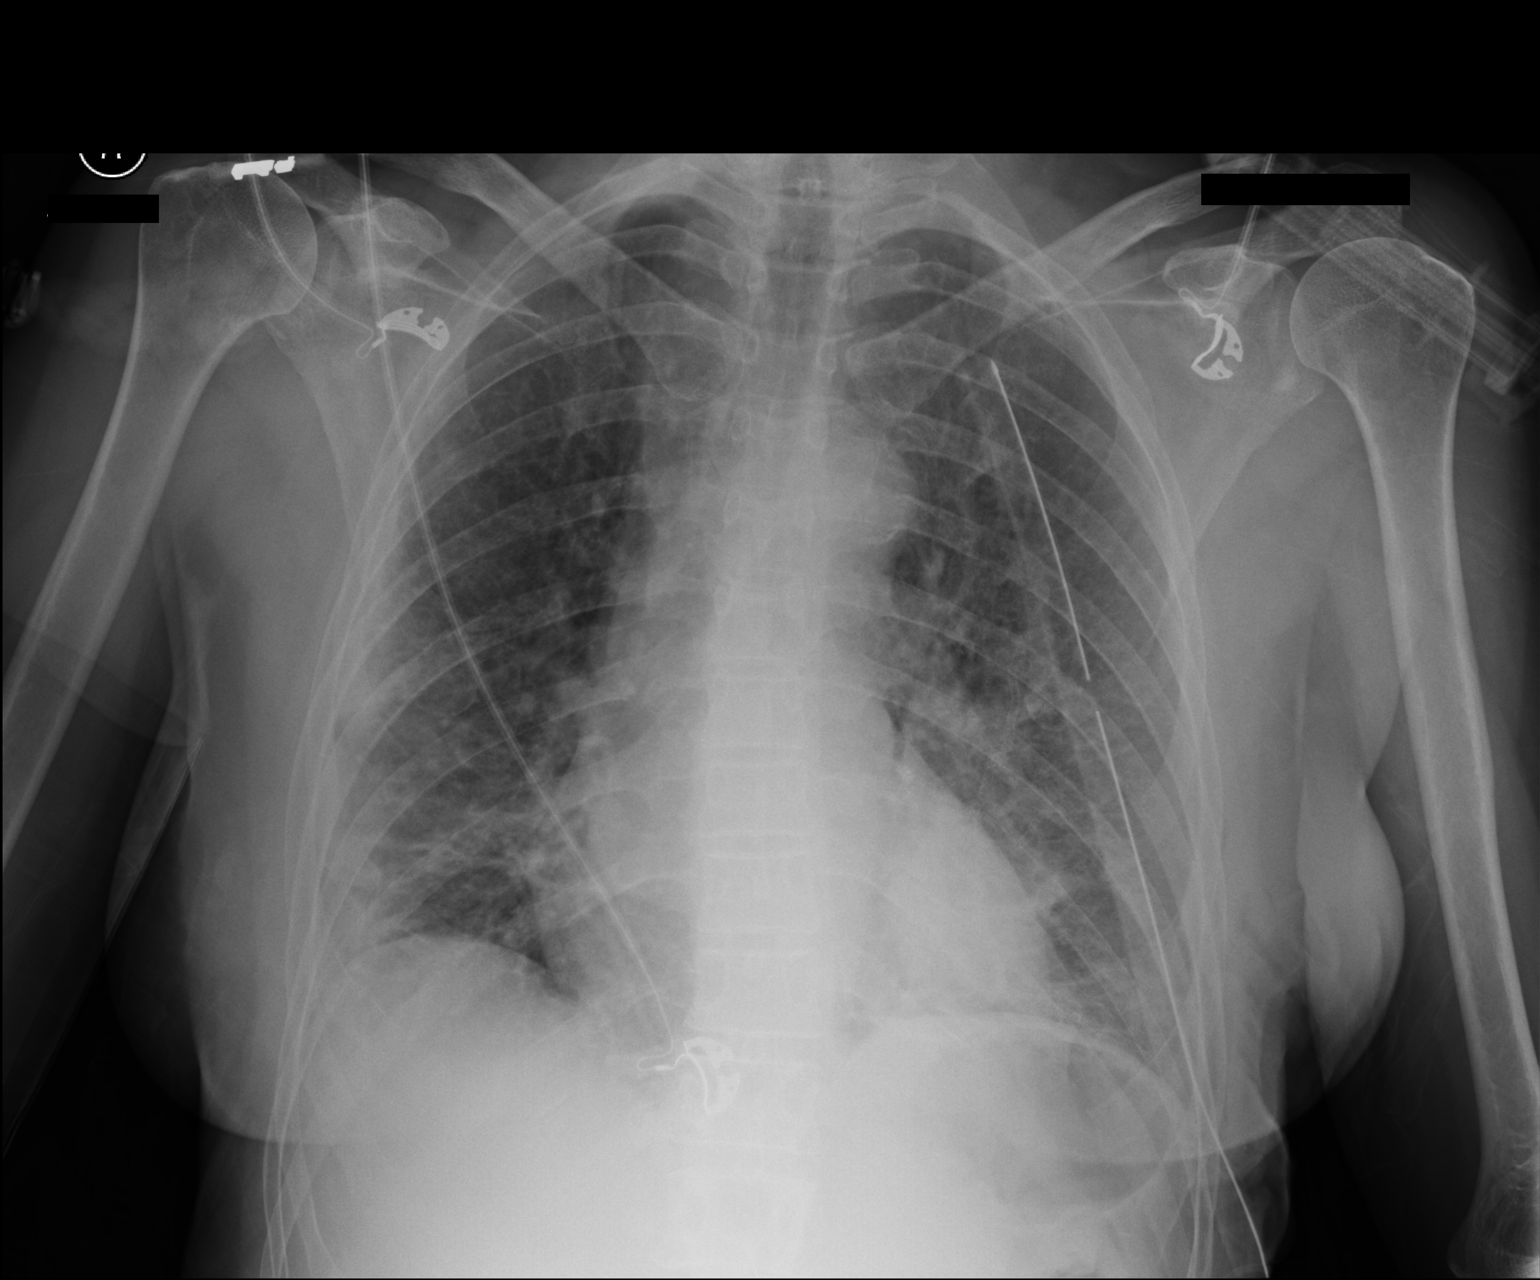

[1 of 1 positions shown; findings below may reference images not displayed]

FINDINGS: Interval placement of left chest tube and postoperative changes on
the left. No pneumothorax. Airspace opacities in both lower lobes,
left greater than right could reflect atelectasis or edema. Heart is
upper limits normal in size. No visible effusions. No acute bony
abnormality.
IMPRESSION: Postoperative changes on the left full left chest tube in place. No
pneumothorax.

Perihilar and lower lobe edema or atelectasis.

## 2017-07-12 IMAGING — CR DG CHEST 1V PORT
1 series · 1 of 1 positions shown · non-contrast
Comparison: Portable chest x-ray February 13, 2015

CLINICAL DATA: Lung malignancy, status post VATS on February 13, 2015

EXAM:
PORTABLE CHEST 1 VIEW

[AP]
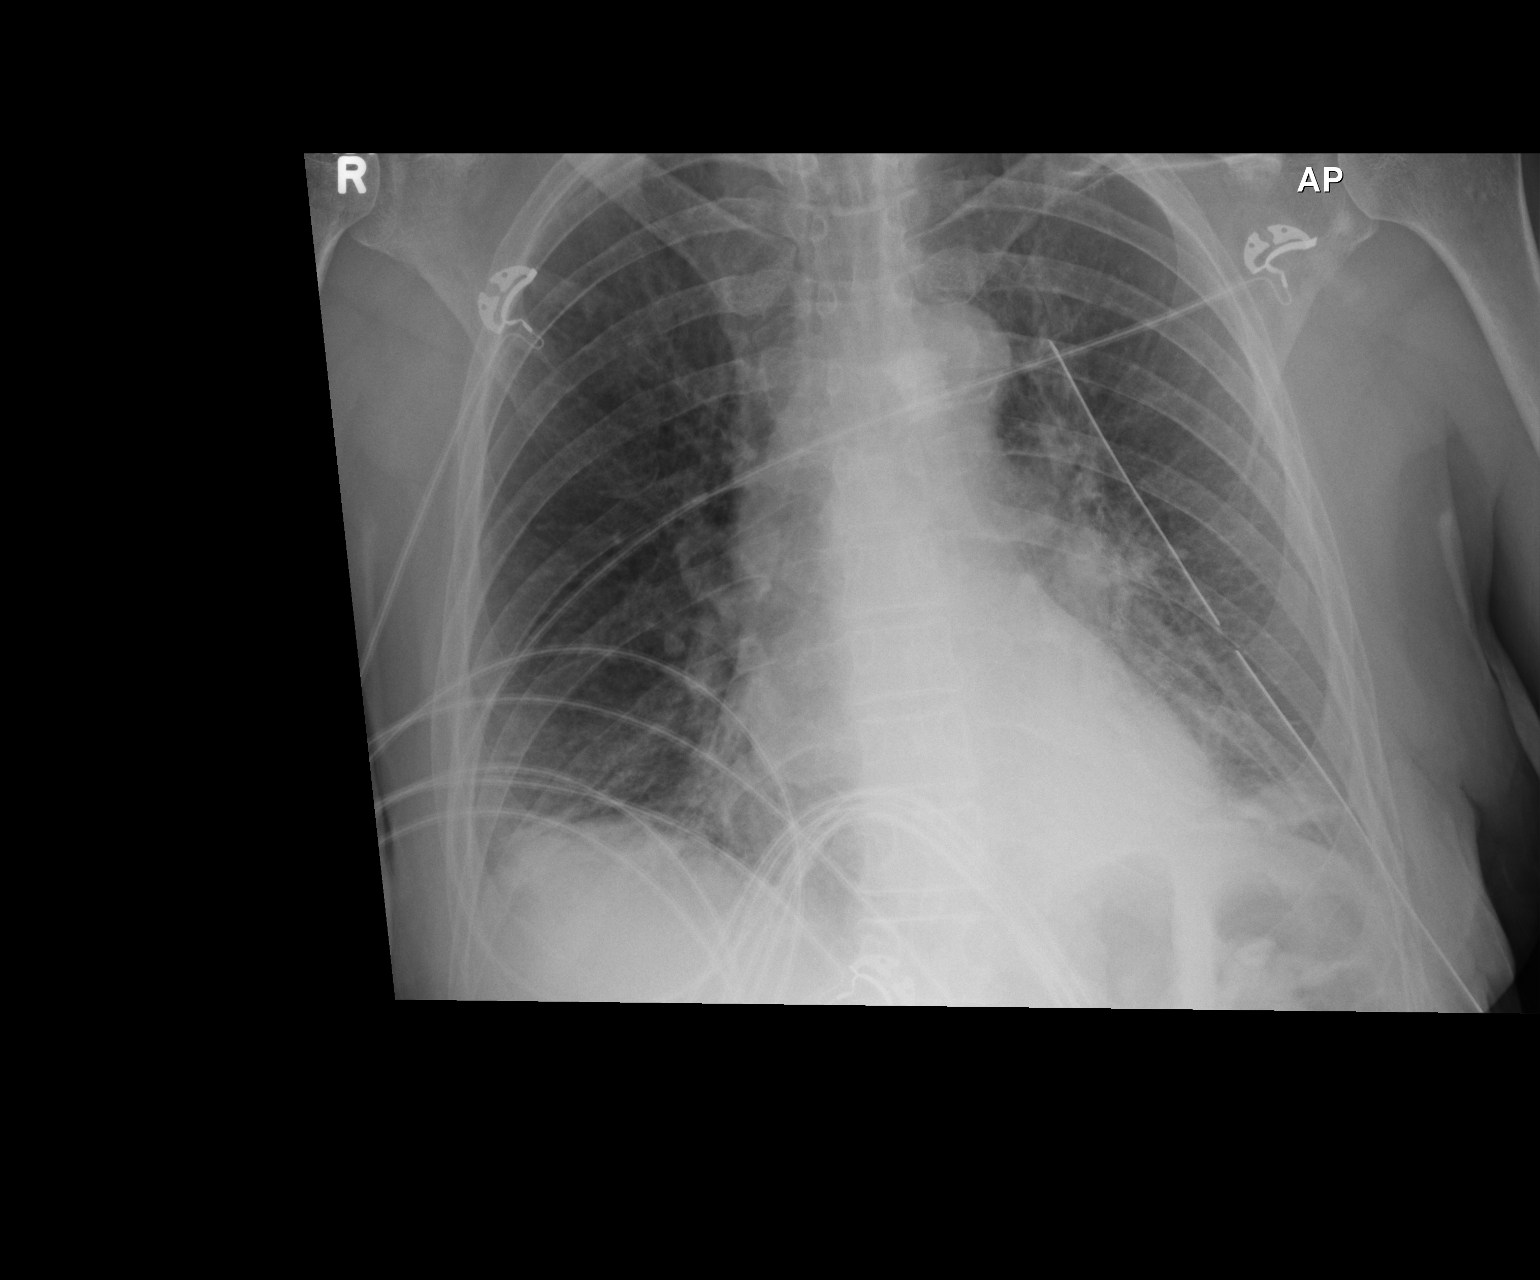

[1 of 1 positions shown; findings below may reference images not displayed]

FINDINGS: The lungs are well-expanded. There is no pneumothorax or significant
pleural effusion on the left. The left-sided chest tube tip projects
over the posterior aspect of the sixth rib. There is minimal
subsegmental atelectasis at both bases which has improved. The
pulmonary interstitial markings have become nearly normal. The
cardiac silhouette is top-normal in size. The pulmonary vascularity
is not engorged.
IMPRESSION: Interval improvement in the appearance of the pulmonary interstitium
consistent with resolving edema. There is minimal bibasilar
subsegmental atelectasis. There is no pneumothorax or significant
pleural effusion.

## 2017-07-13 IMAGING — CR DG CHEST 1V PORT
1 series · 1 of 1 positions shown · non-contrast
Comparison: Radiograph 02/14/2015

CLINICAL DATA: Adenocarcinoma LEFT lung

EXAM:
PORTABLE CHEST 1 VIEW

[AP]
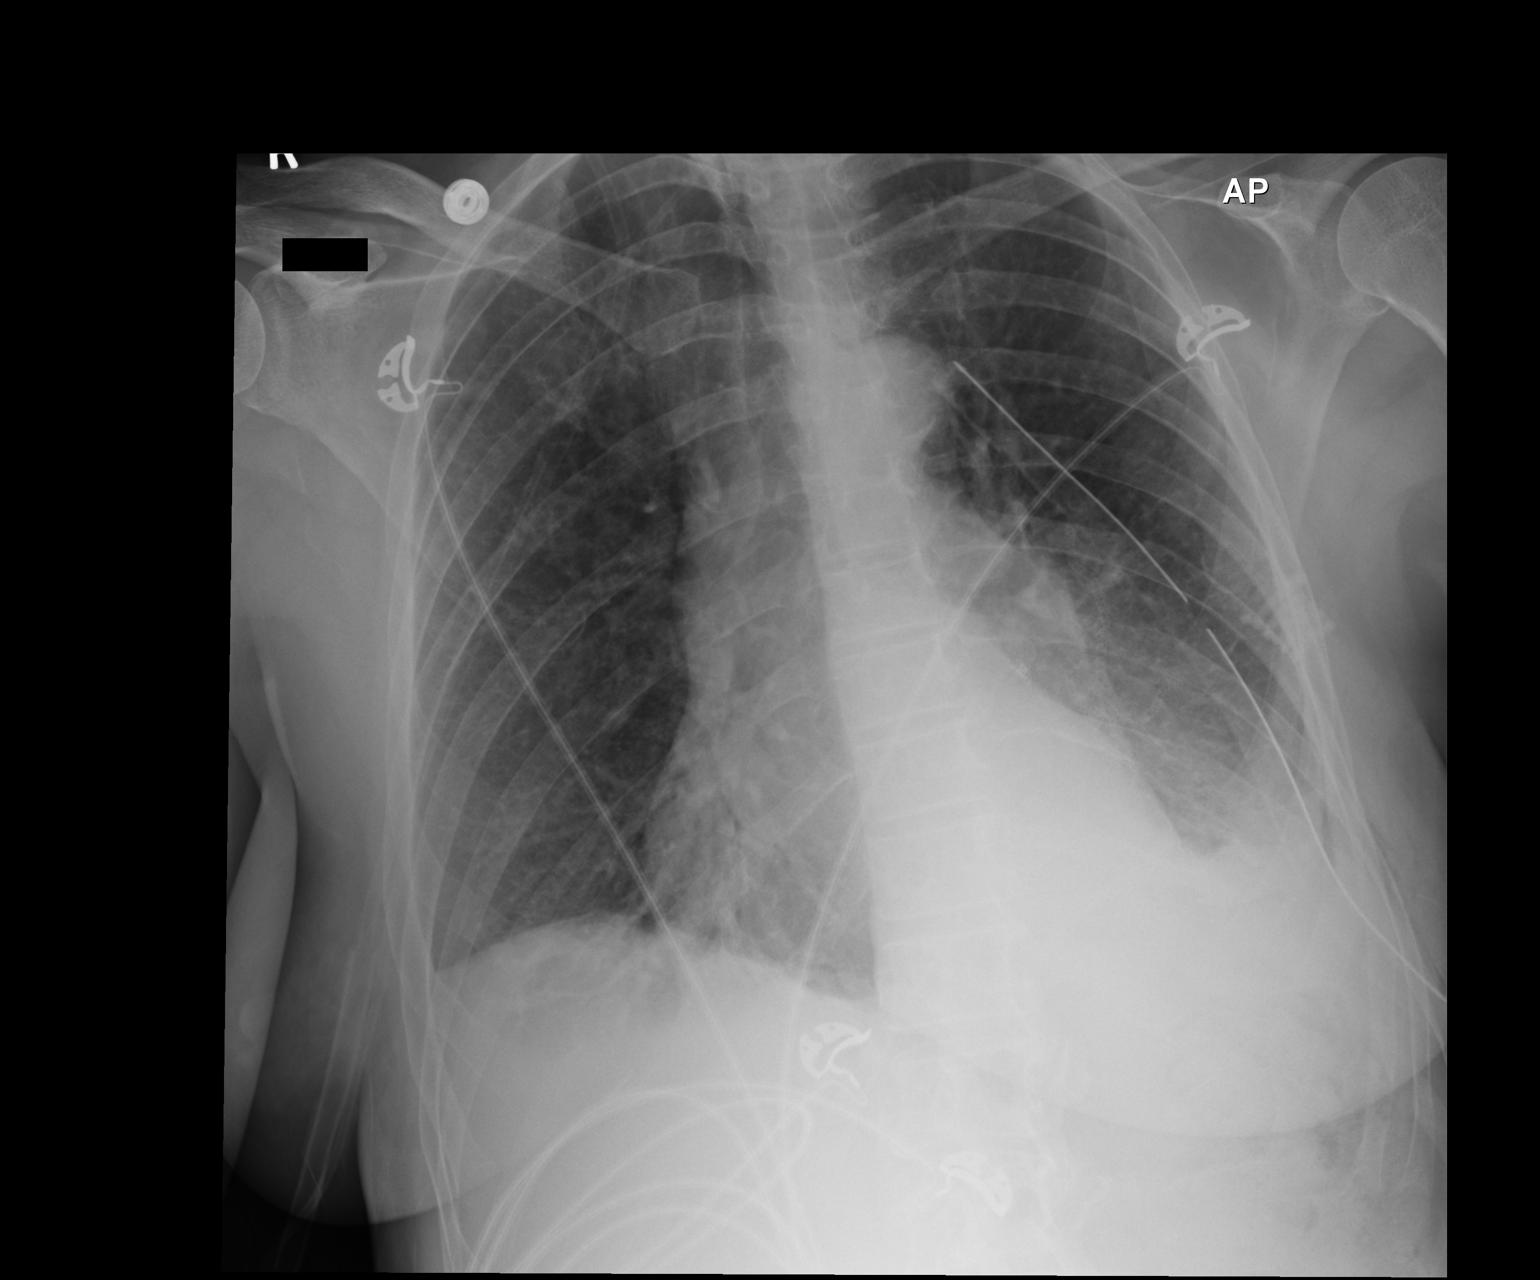

[1 of 1 positions shown; findings below may reference images not displayed]

FINDINGS: Normal cardiac silhouette. LEFT chest tube in place without
pneumothorax. LEFT basilar atelectasis is increased in the interval.
RIGHT lung is clear.
IMPRESSION: 1. Interval increase in LEFT pleural effusion and atelectasis.
2. LEFT chest tube in place without pneumothorax

## 2017-07-14 IMAGING — CR DG CHEST 1V PORT
1 series · 1 of 1 positions shown · non-contrast
Comparison: 02/15/2015

CLINICAL DATA: 54-year-old female with a history of adenocarcinoma
left lung. Status post left vats lower lobe anterior basilar
segmentectomy.

EXAM:
PORTABLE CHEST 1 VIEW

[AP]
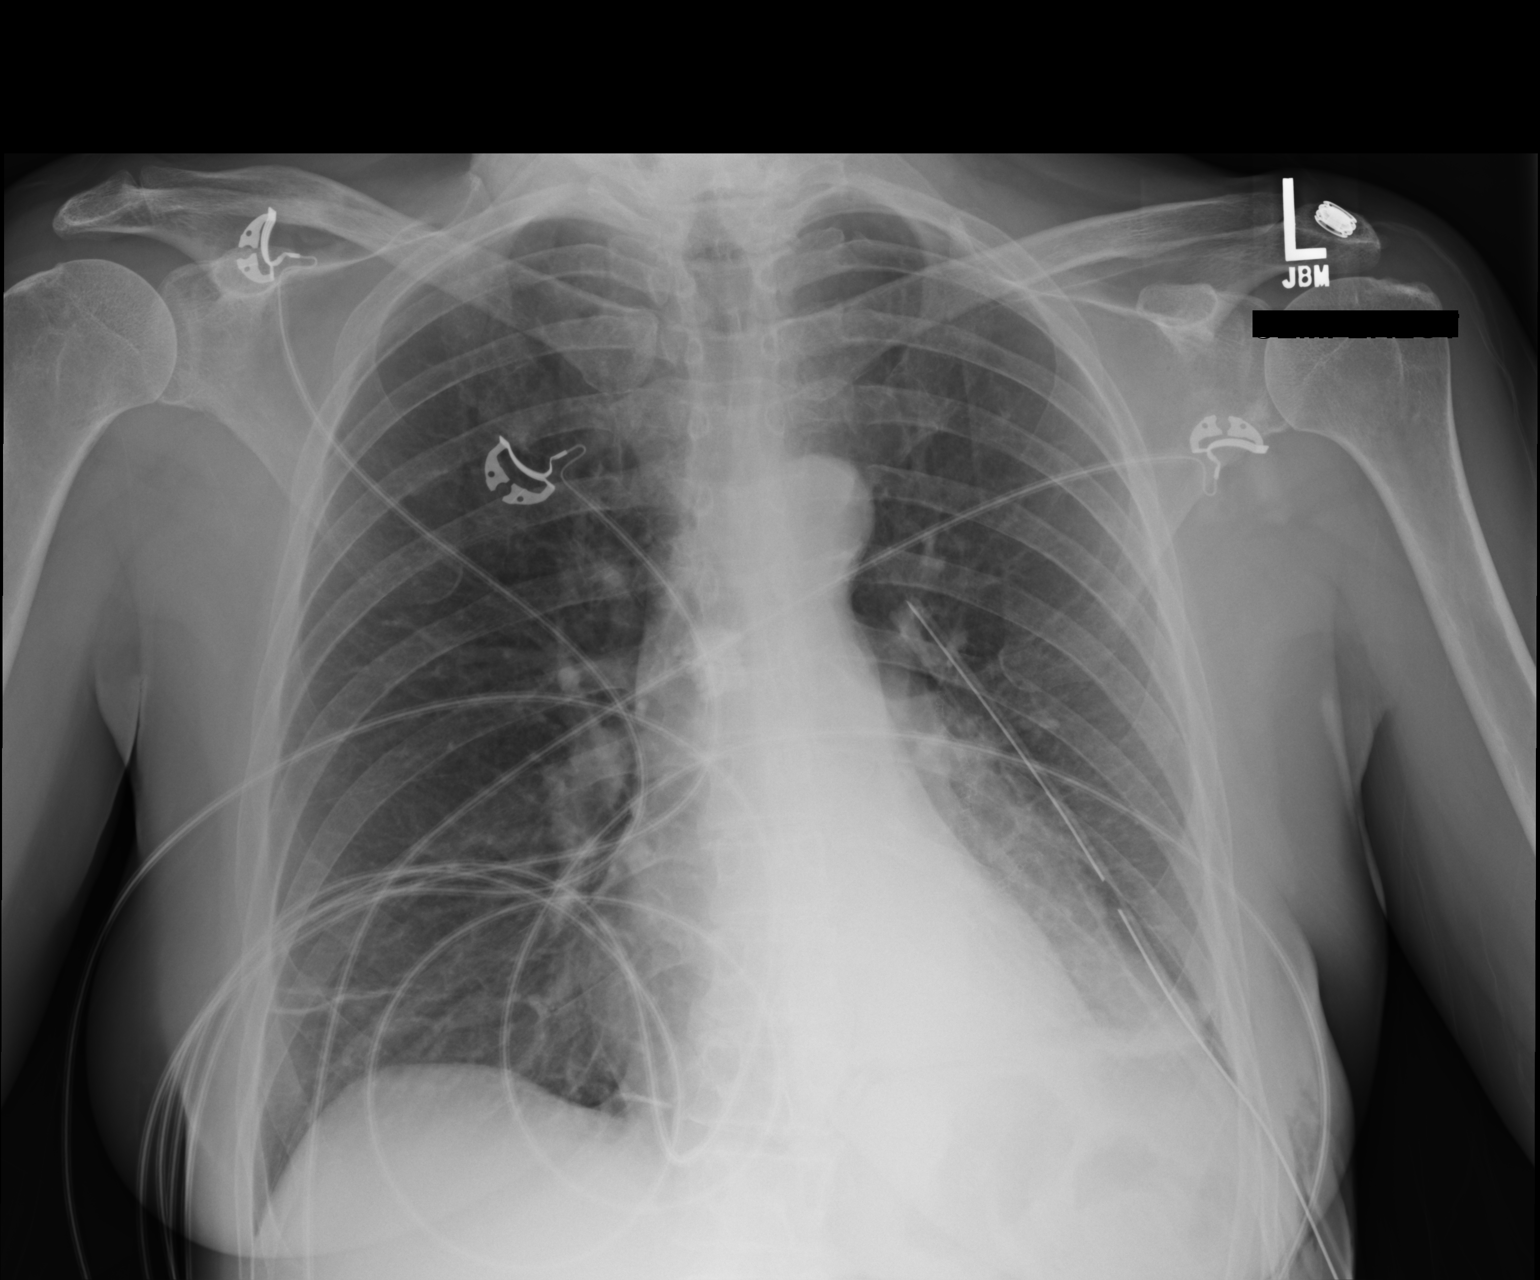

[1 of 1 positions shown; findings below may reference images not displayed]

FINDINGS: Cardiomediastinal silhouette unchanged in size and contour. No
evidence of pulmonary vascular congestion.

Unchanged position of left-sided thoracostomy tube.

Persisting opacity at the left base with partial obscuration of left
hemidiaphragm.

Questionable tiny left apical pneumothorax.
IMPRESSION: Unchanged position of left thoracostomy tube, with questionable tiny
left apical pneumothorax.

Similar appearance of left basilar atelectasis.

## 2017-07-15 IMAGING — CR DG CHEST 1V PORT
1 series · 1 of 1 positions shown · non-contrast
Comparison: Chest x-ray 02/16/2015.

CLINICAL DATA: 54-year-old female with history of pneumothorax.

EXAM:
PORTABLE CHEST 1 VIEW

[AP]
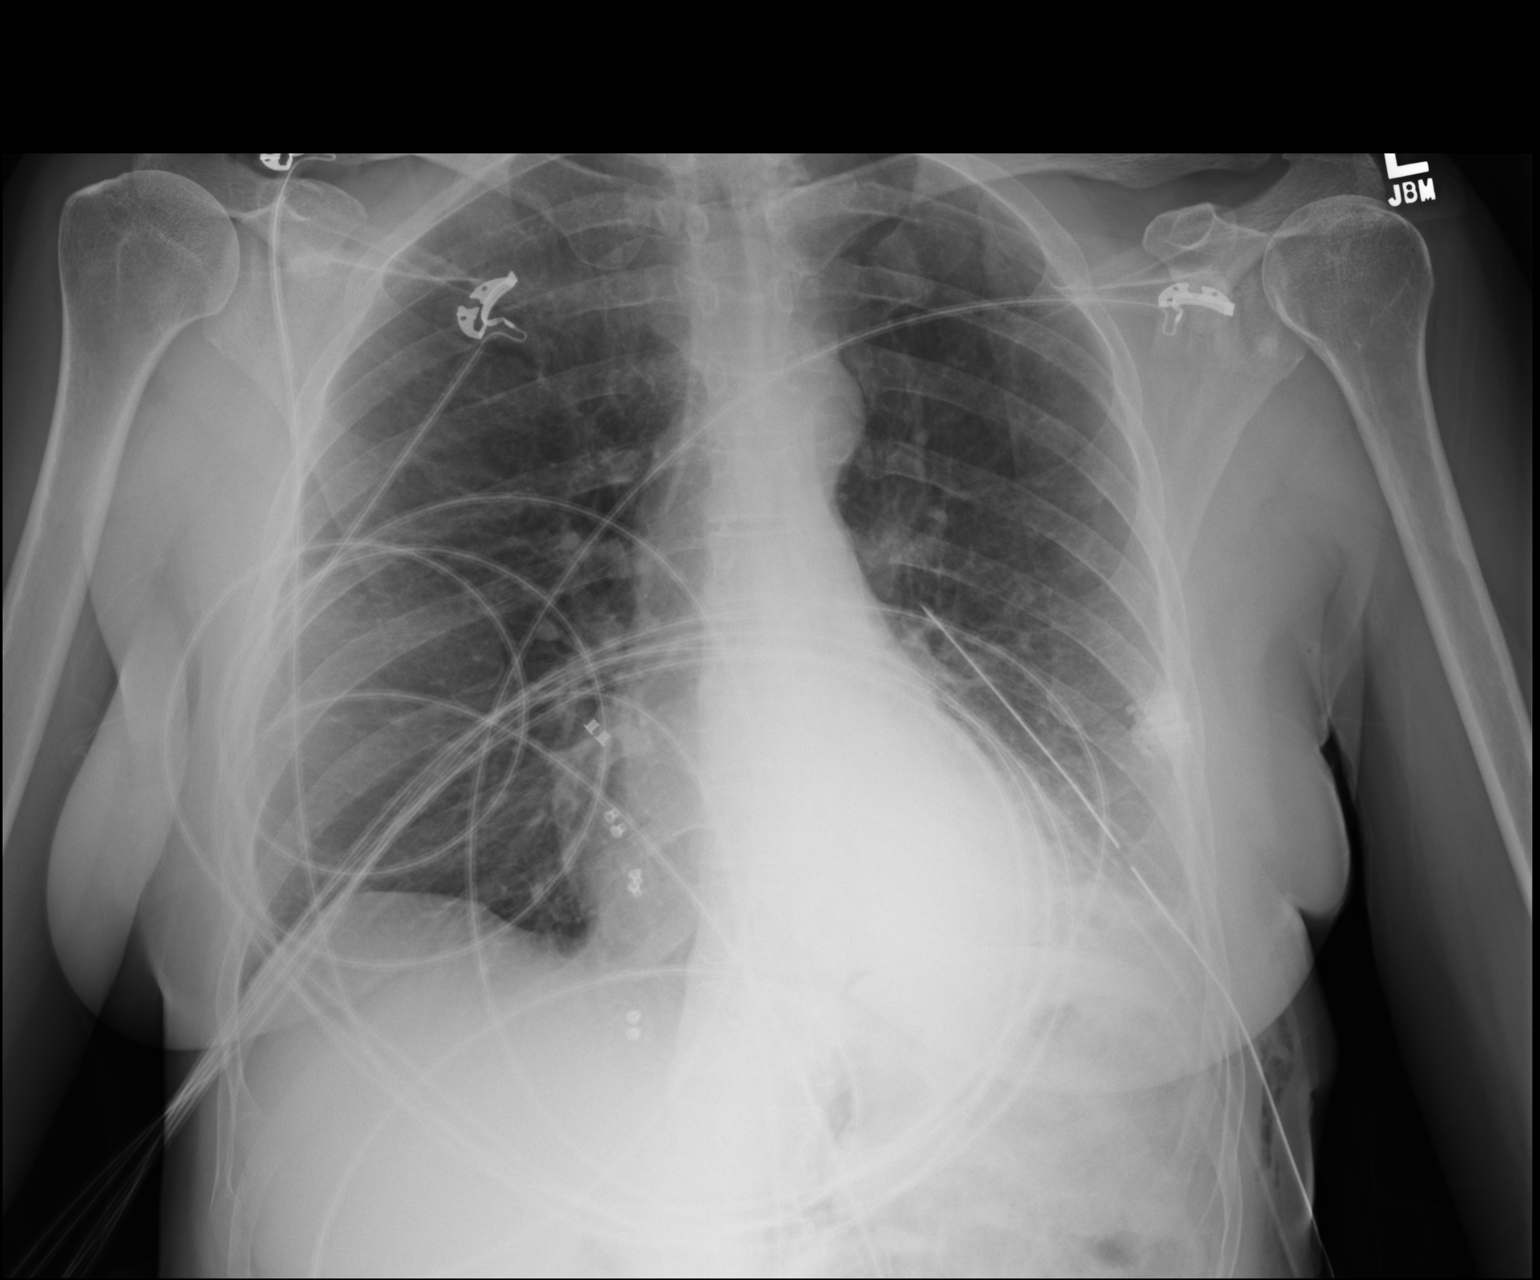

[1 of 1 positions shown; findings below may reference images not displayed]

FINDINGS: Left-sided chest tube remains in position, with tip slightly lower
near the left hilum. Previously noted trace left apical pneumothorax
has resolved. Small residual left pleural effusion. Postoperative
changes in the left lung base again noted related to recent left
lower lobe anterobasal segmentectomy. New opacity projecting over
the lower left hemithorax, presumably exterior to the patient, as
are multiple metallic densities projecting over the medial lower
right hemithorax. Right lung is clear. No right pleural effusion. No
evidence of pulmonary edema. Heart size and mediastinal contours are
within normal limits. Atherosclerosis in the thoracic aorta.
IMPRESSION: 1. Left-sided chest tube remains in position. Previously noted left
pneumothorax has resolved.
2. Postoperative changes related to recent left lower lobe
anterobasal segmentectomy redemonstrated, with resolving
postoperative changes in the left lower lobe and small left pleural
effusion.
3. Atherosclerosis.

## 2017-07-16 IMAGING — DX DG CHEST 2V
2 series · 2 of 2 positions shown · non-contrast
Comparison: Portable chest x-ray February 17, 2015

CLINICAL DATA: Status post partial lobectomy, subsequent left-sided
chest tube removal

EXAM:
CHEST  2 VIEW

[w chest pa]
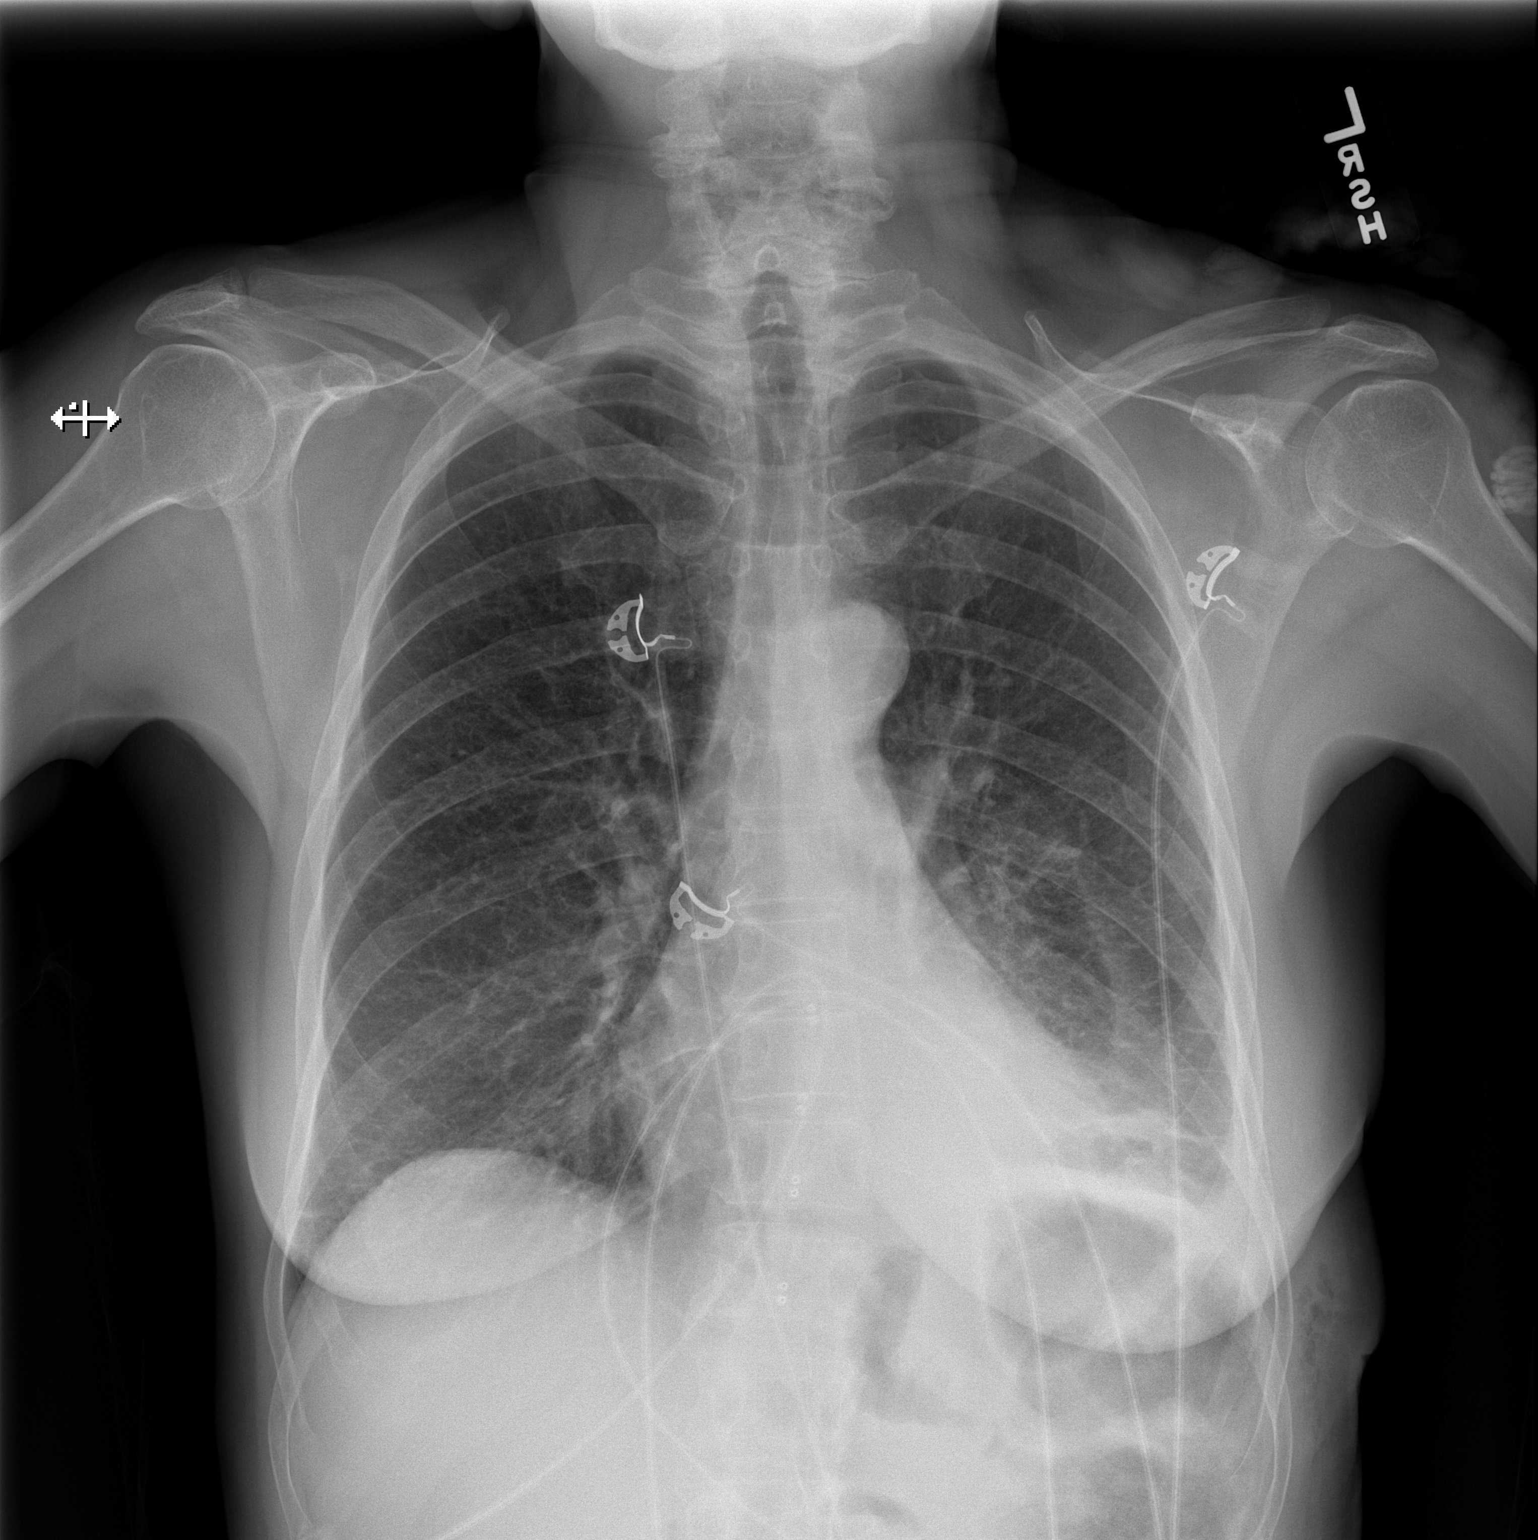

[w chest lat]
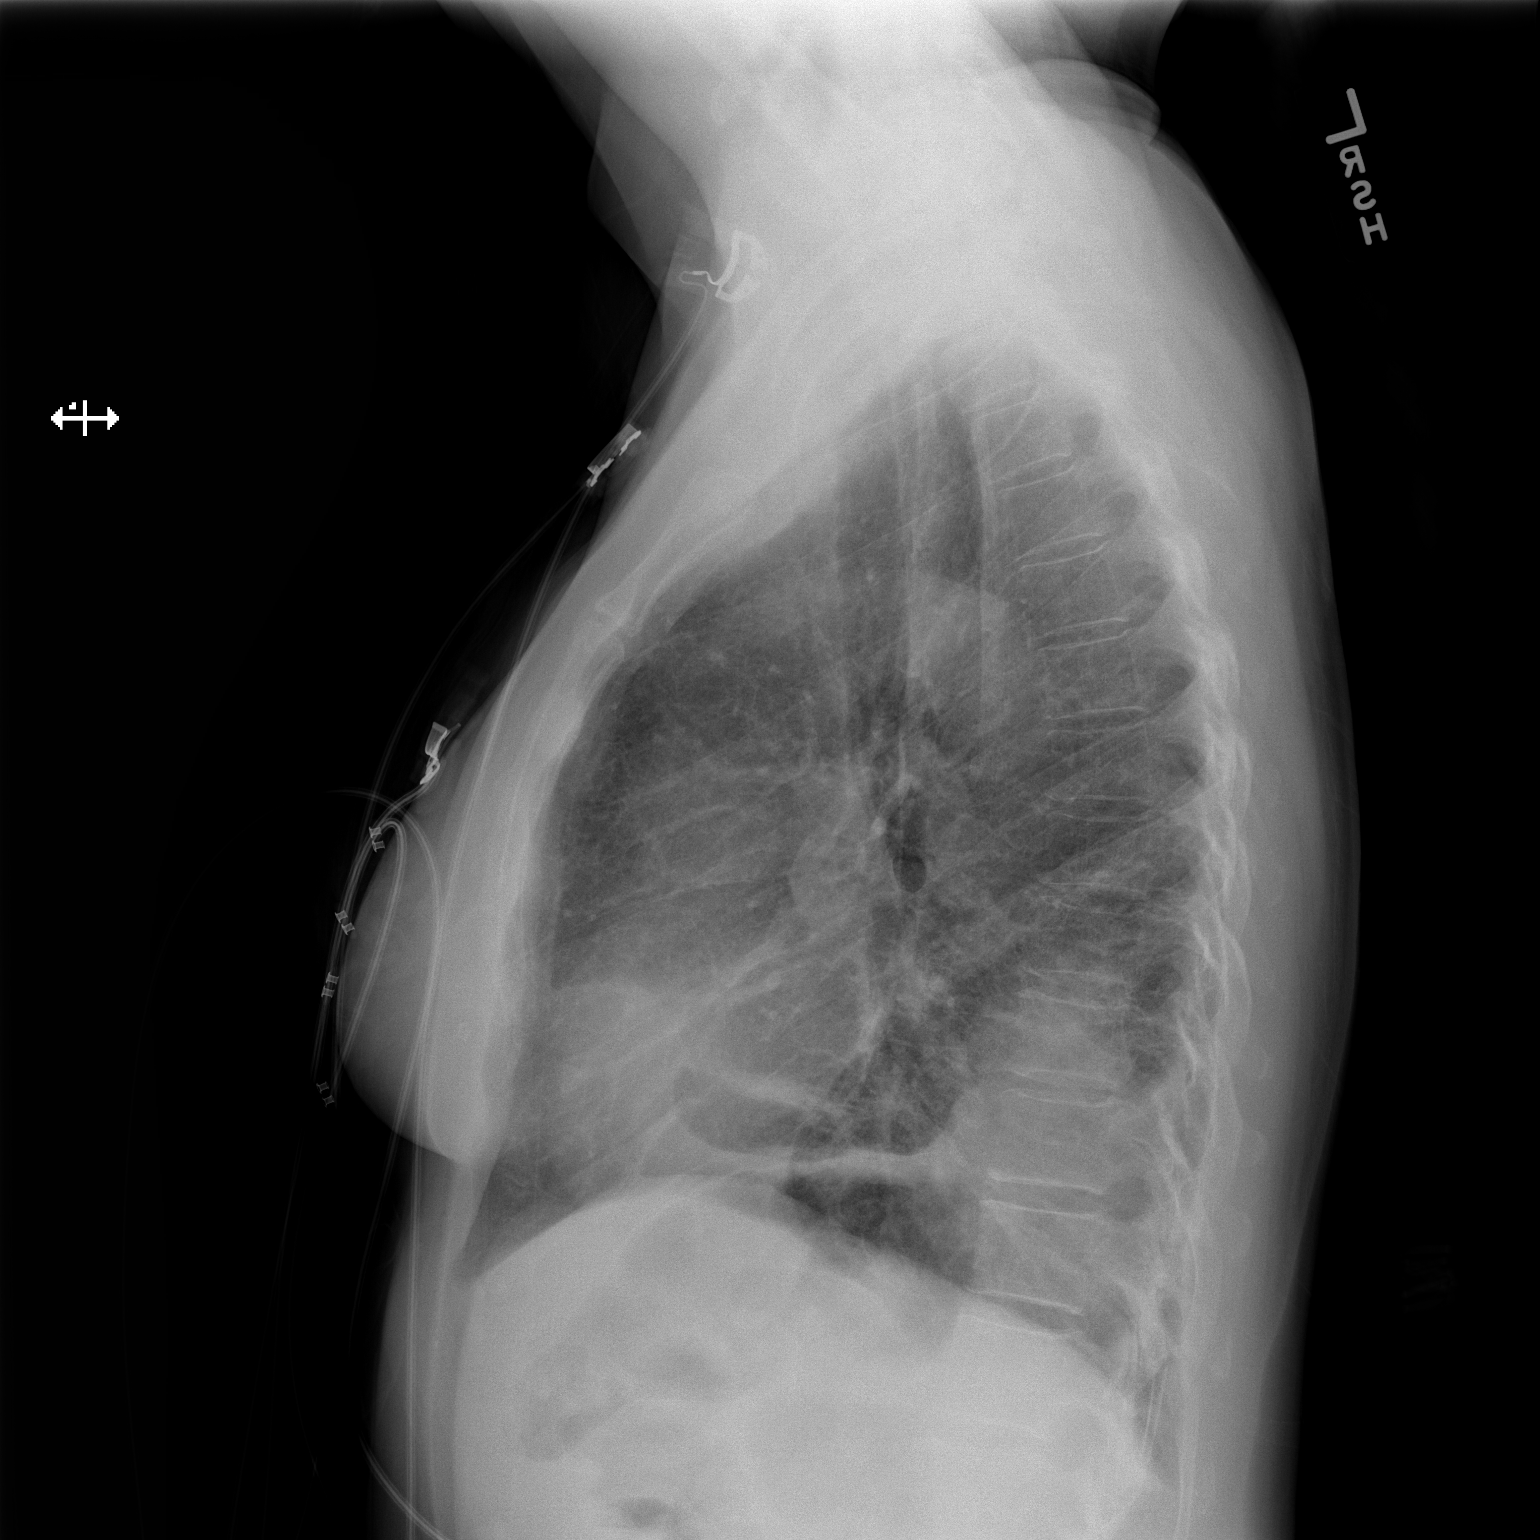

[2 of 2 positions shown; findings below may reference images not displayed]

FINDINGS: The patient has undergone interval removal of the left-sided chest
tube. A faint pleural line in the apex is observed. This amounts to
5% or less pneumothorax. There is a small amount of pleural fluid at
the left lung base. Atelectasis or infiltrate posteriorly in the
left hemi thorax is present. The right lung is clear. There is no
pneumomediastinum. The heart and pulmonary vascularity are normal.
The trachea is midline. The bony thorax exhibits no acute
abnormality.
IMPRESSION: Interval left-sided chest tube removal. Small amount of residual
left pleural fluid. A moderate amount of atelectasis or infiltrate
in the left lower lobe posteriorly is present. A trace of pleural
space air in the left pulmonary apex is noted but this amounts to 5%
or less of the lung volume.

## 2017-07-19 IMAGING — CR DG CHEST 2V
2 series · 2 of 2 positions shown · non-contrast
Comparison: Comparison made to prior study of 02/18/2015,
02/17/2015, CT 11/20/2014.

CLINICAL DATA: Shortness of breath. Chest pain. Fever. Prior left
lung surgery.

EXAM:
CHEST  2 VIEW

[w chest pa]
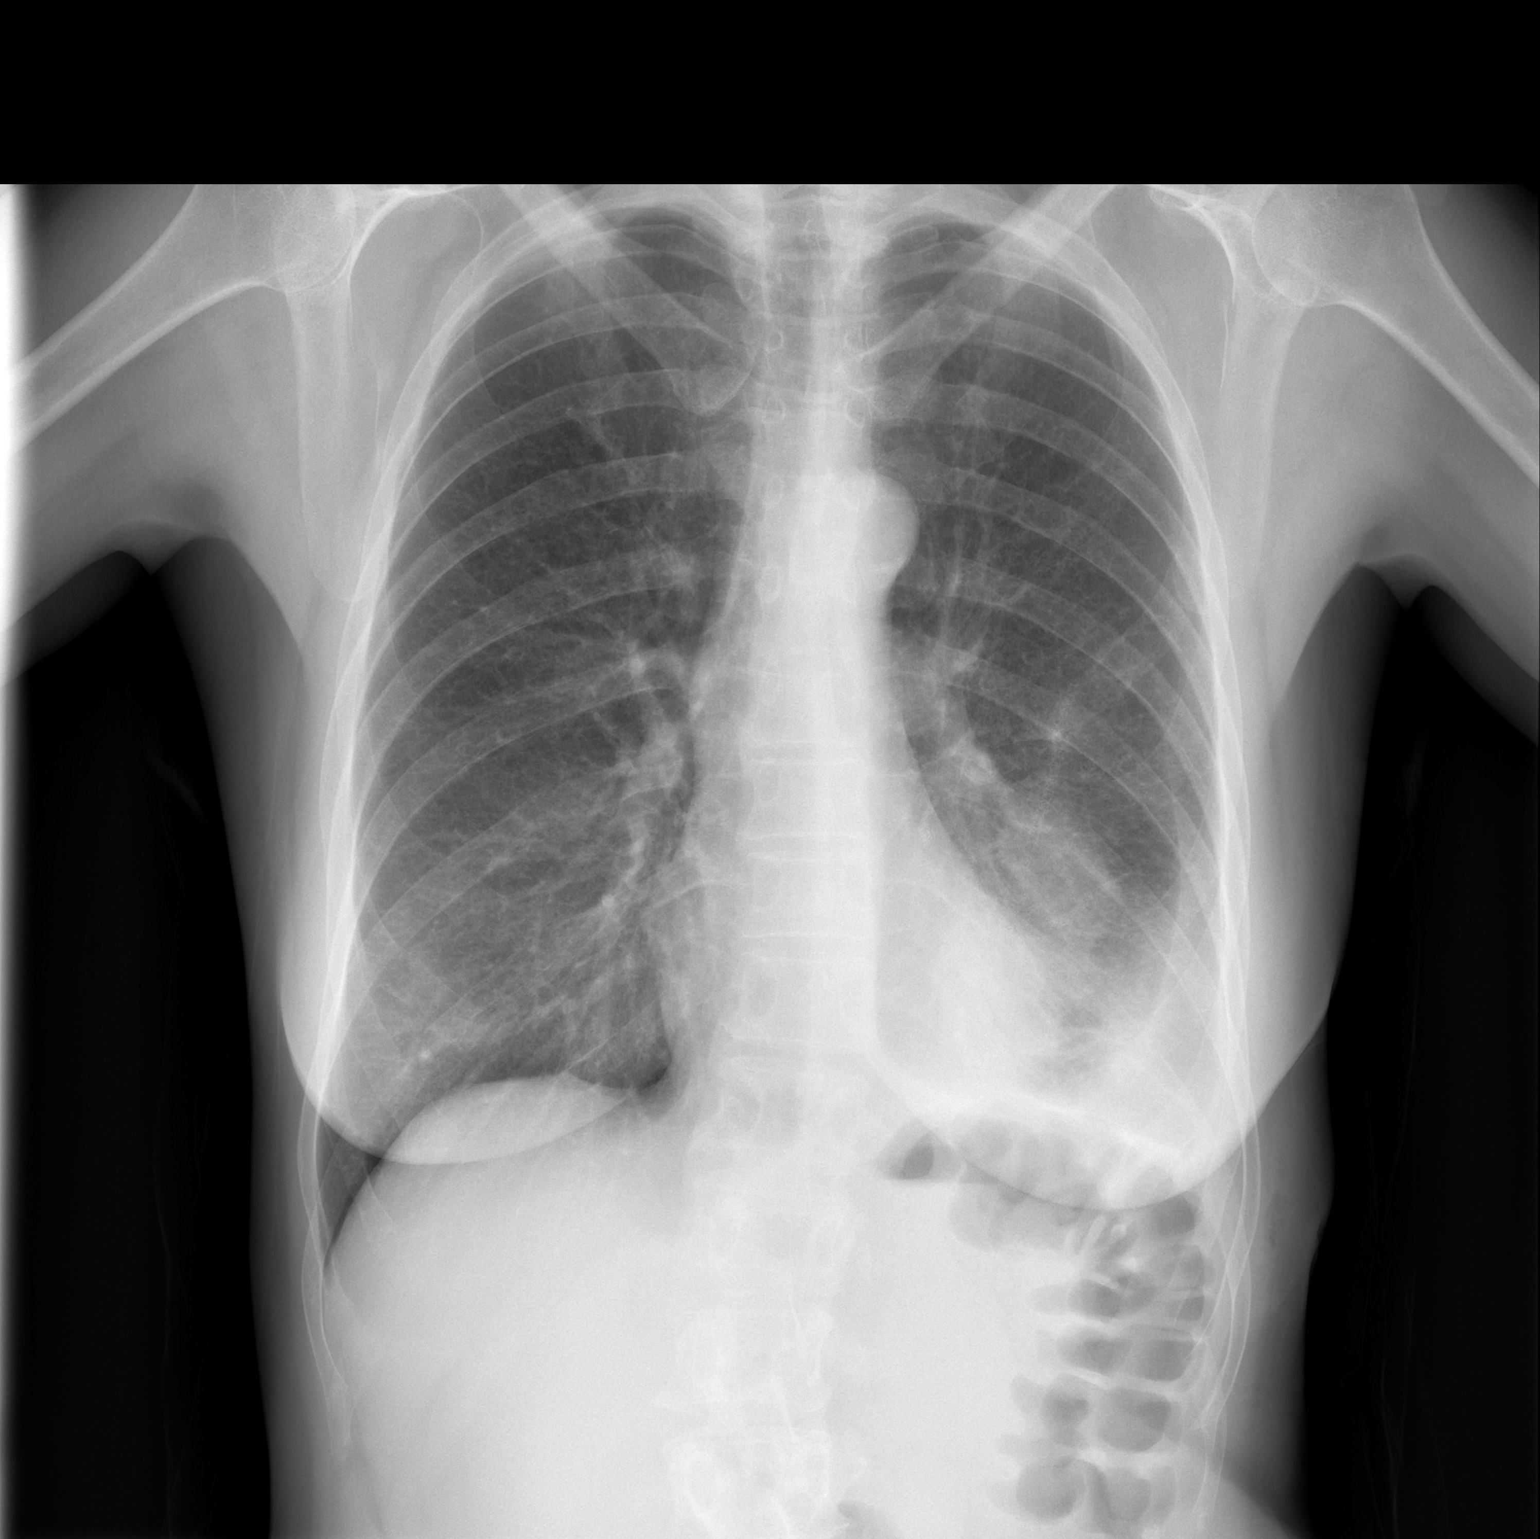

[w chest lat]
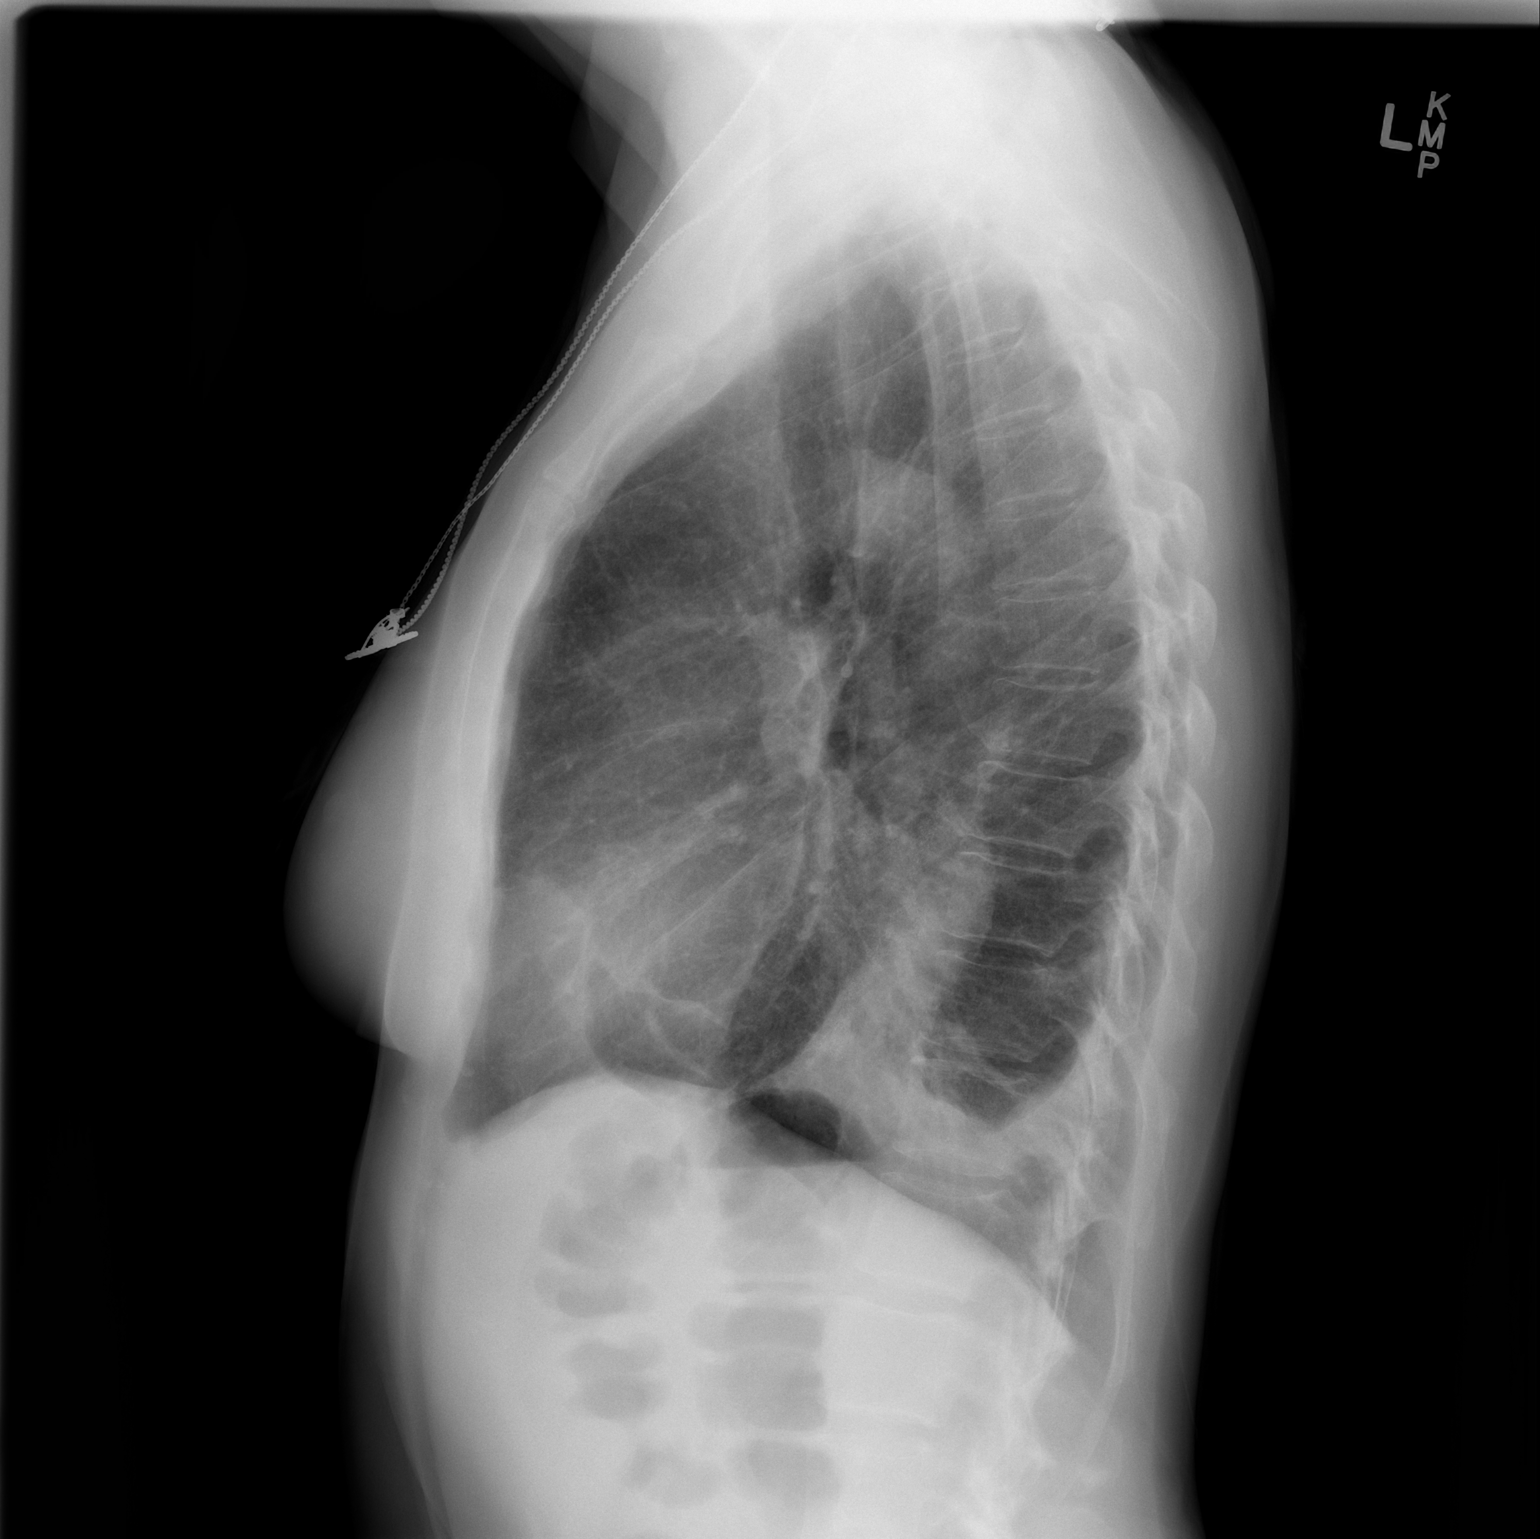

[2 of 2 positions shown; findings below may reference images not displayed]

FINDINGS: Mediastinum hilar structures are normal. Heart size normal.
Developing left lower lobe infiltrate noted. Small left pleural
effusion noted. No pneumothorax. No acute bony abnormality.
IMPRESSION: 1. Developing left lower lobe infiltrate most consistent with
pneumonia. Progressive left pleural effusion.
2. No other focal abnormality. No pneumothorax noted on today's
exam.

## 2017-07-31 IMAGING — CR DG CHEST 2V
2 series · 2 of 2 positions shown · non-contrast
Comparison: 02/21/2015.

CLINICAL DATA: Chest pain.  Prior lung surgery.

EXAM:
CHEST  2 VIEW

[w chest pa]
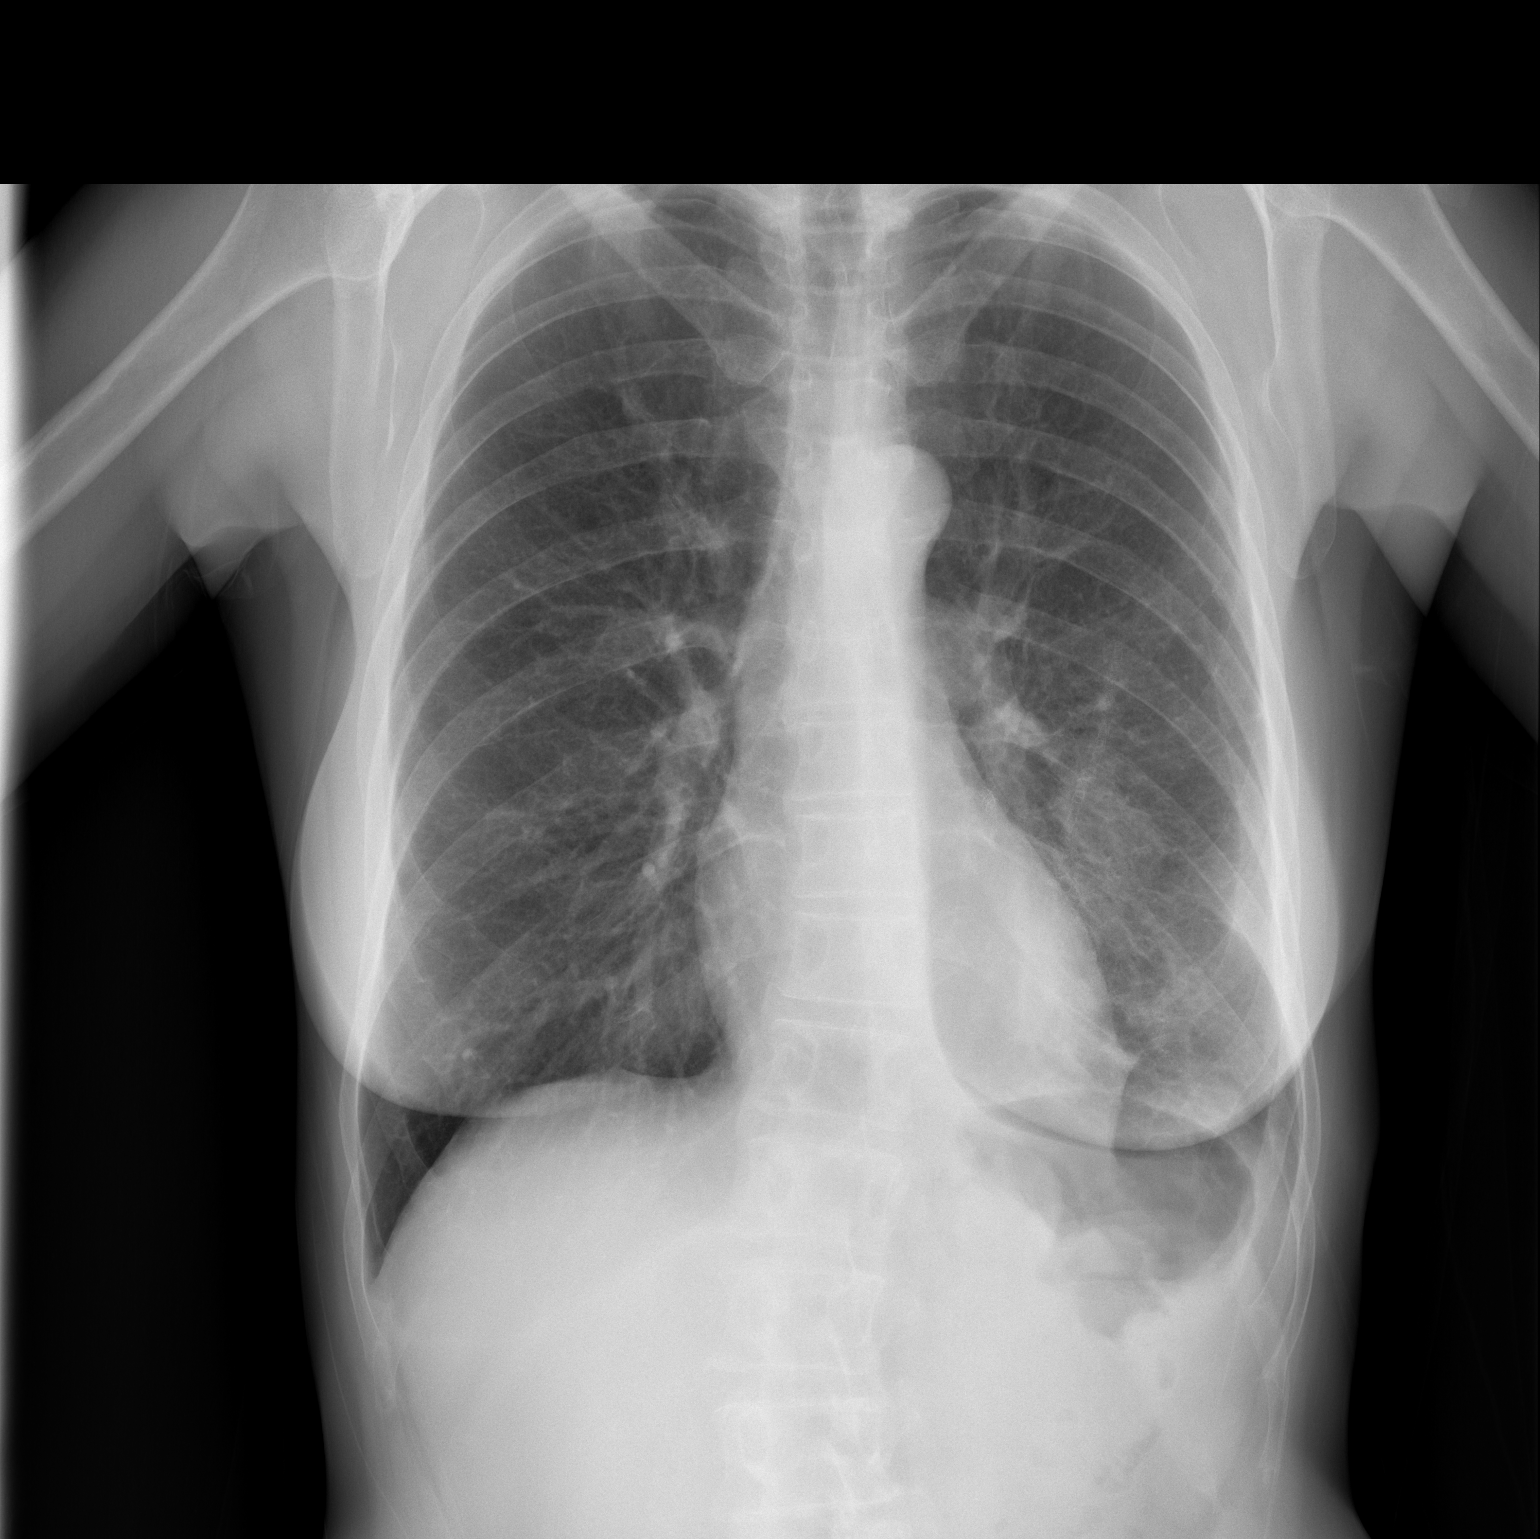

[w chest lat]
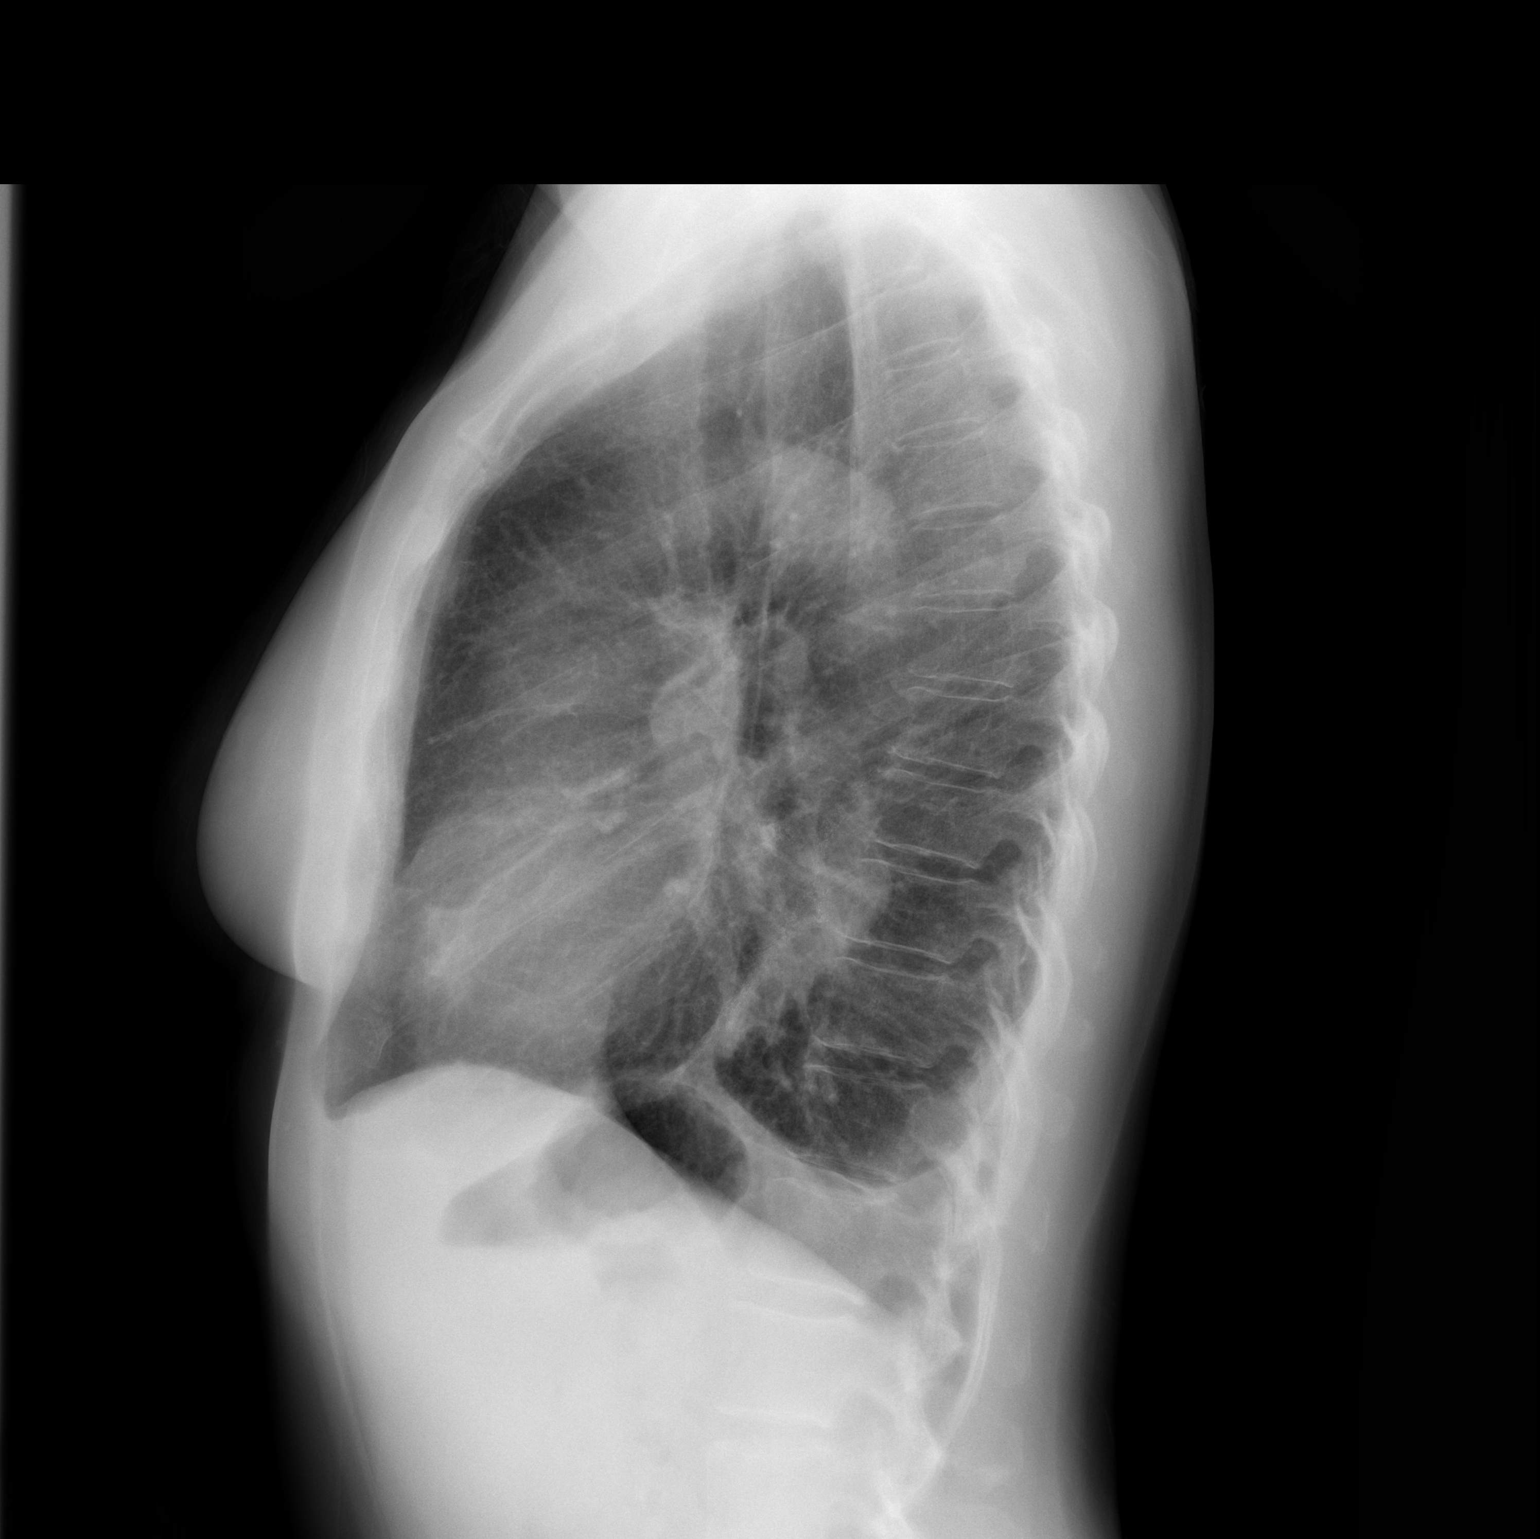

[2 of 2 positions shown; findings below may reference images not displayed]

FINDINGS: Mediastinum and hilar structures are normal. Interim improvement of
left lower lobe infiltrate and left pleural effusion. Heart size
normal. No pneumothorax. No acute bony abnormality .
IMPRESSION: Improve of left lower lobe infiltrate and left pleural effusion.

## 2017-08-07 IMAGING — DX DG CHEST 2V
2 series · 2 of 2 positions shown · non-contrast
Comparison: 03/05/2015 and earlier, including PET-CT 12/11/2014 and
CT chest 11/20/2014.

CLINICAL DATA: Acute onset of left-sided chest pain extending
anteriorly to posteriorly. Resection of a left lower lobe subsolid
nodule on 02/13/2015, pathology revealing adenocarcinoma.

EXAM:
CHEST  2 VIEW

[chest pa]
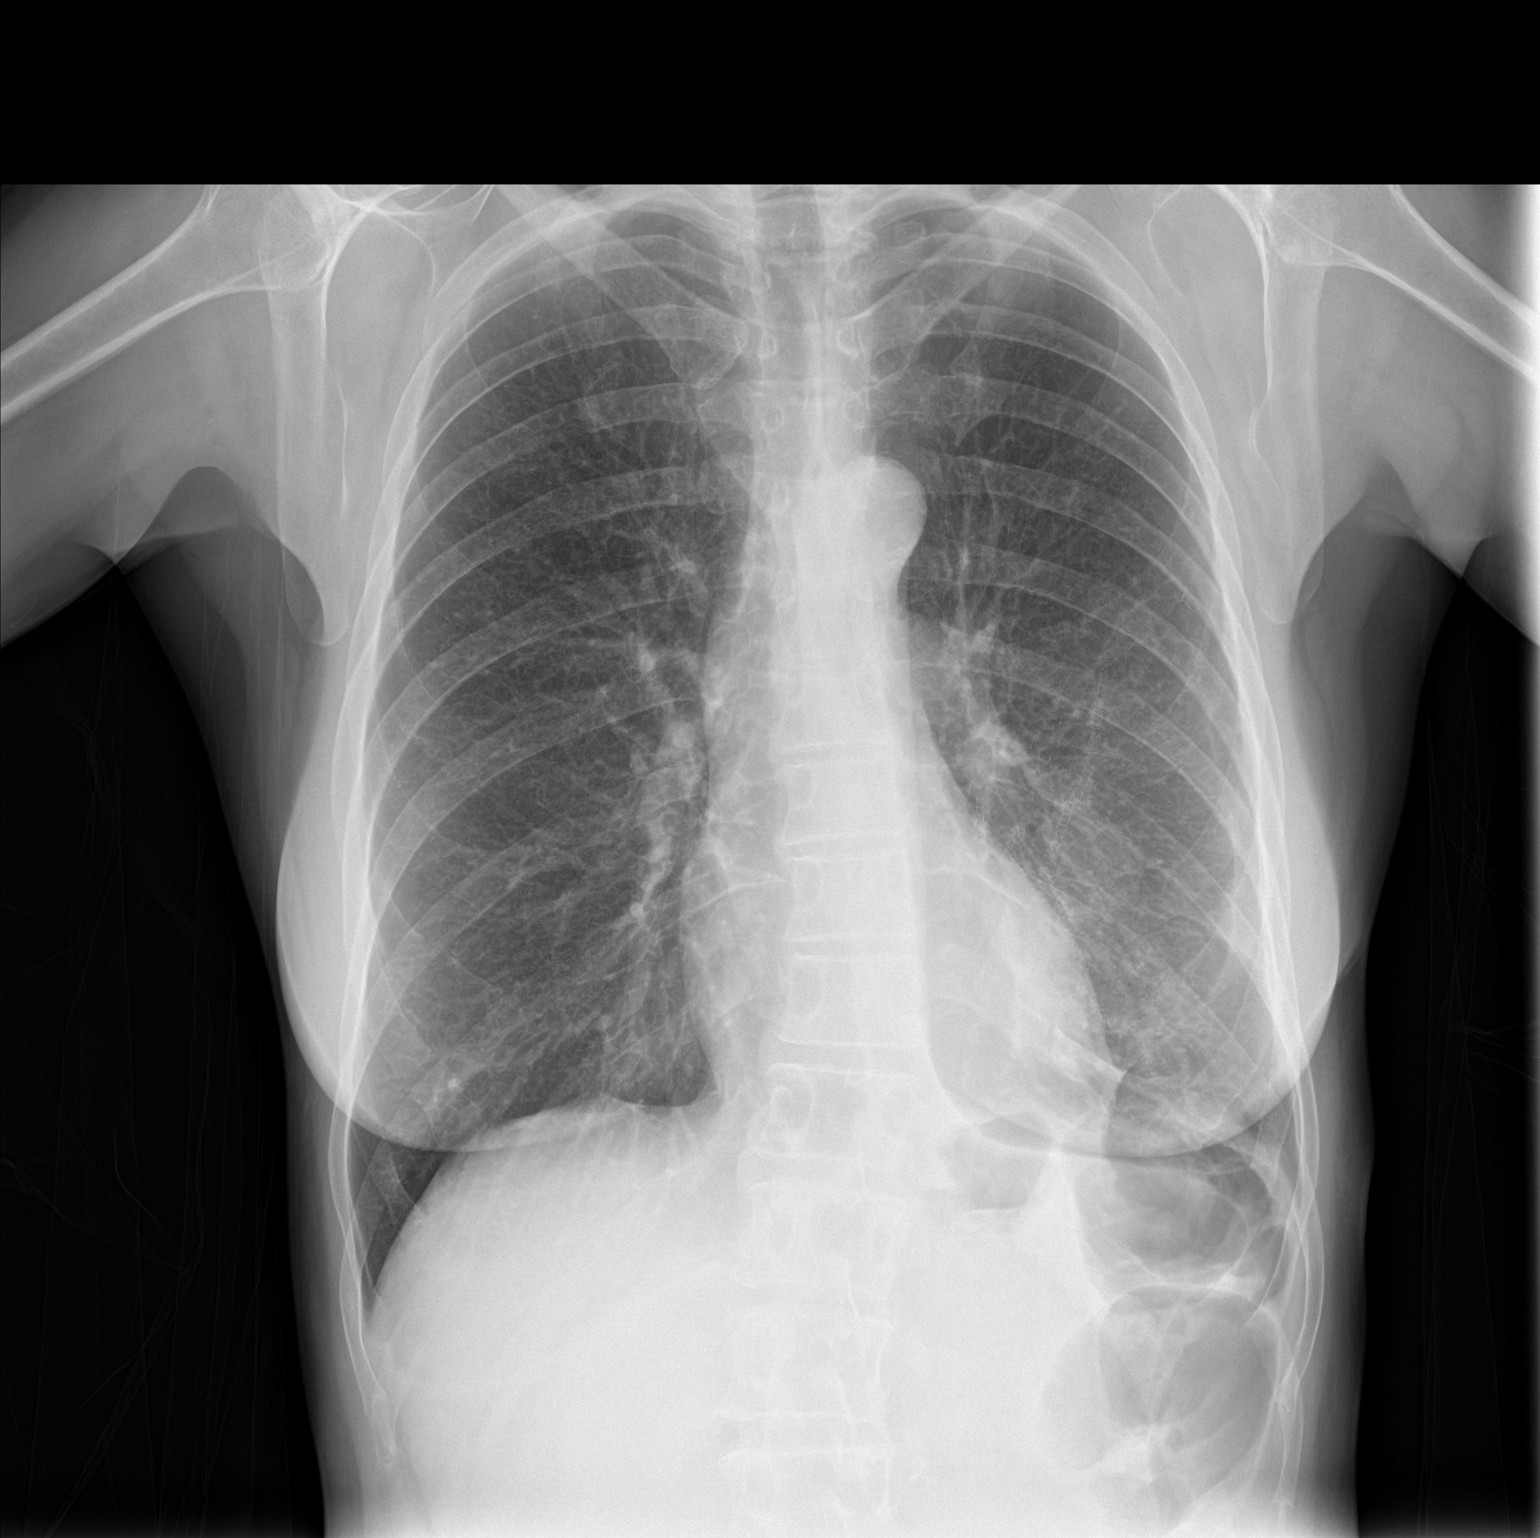

[chest lat]
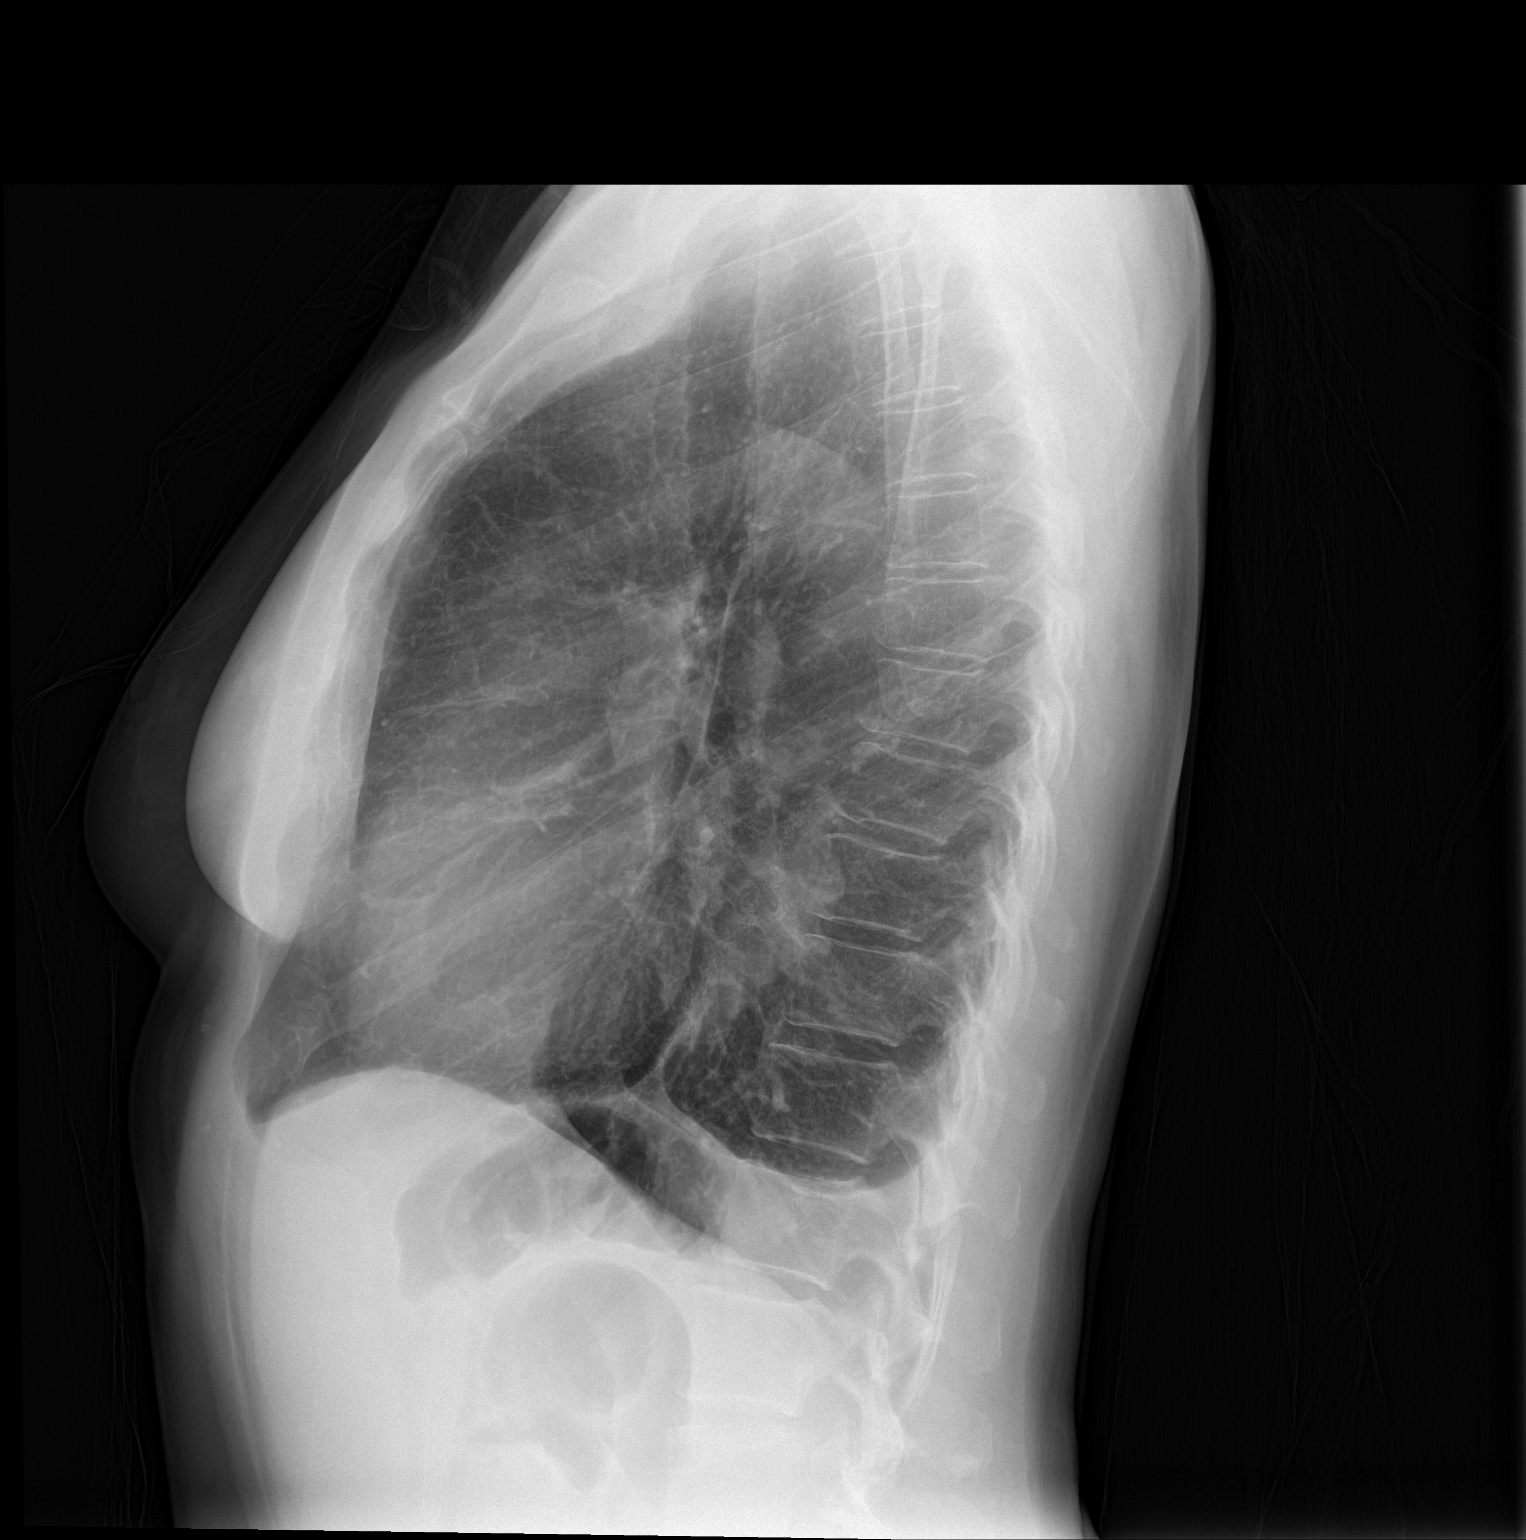

[2 of 2 positions shown; findings below may reference images not displayed]

FINDINGS: Cardiac silhouette normal in size, unchanged. Thoracic aorta mildly
atherosclerotic, unchanged. Hilar and mediastinal contours otherwise
unremarkable. Postsurgical scarring involving the left lower lobe,
with tenting of the left hemidiaphragm. Hyperinflation and
emphysematous changes throughout both lungs, unchanged. Lungs
otherwise clear. No localized airspace consolidation. No pleural
effusions. No pneumothorax. Normal pulmonary vascularity. Visualized
bony thorax intact with a slight lower thoracic scoliosis convex
left.
IMPRESSION: 1. COPD/emphysema.  No acute cardiopulmonary disease.
2. Scarring in the left lower lobe related to the recent surgery.

## 2017-09-27 ENCOUNTER — Inpatient Hospital Stay: Payer: Medicaid Other | Attending: Internal Medicine

## 2017-09-28 ENCOUNTER — Ambulatory Visit: Payer: Self-pay

## 2017-10-02 IMAGING — CR DG CHEST 2V
2 series · 2 of 2 positions shown · non-contrast
Comparison: 03/12/2015

CLINICAL DATA: Lung cancer, VATS 02/13/2015. Productive cough for 2
weeks with shortness of breath and left lower rib pain.

EXAM:
CHEST  2 VIEW

[w chest pa]
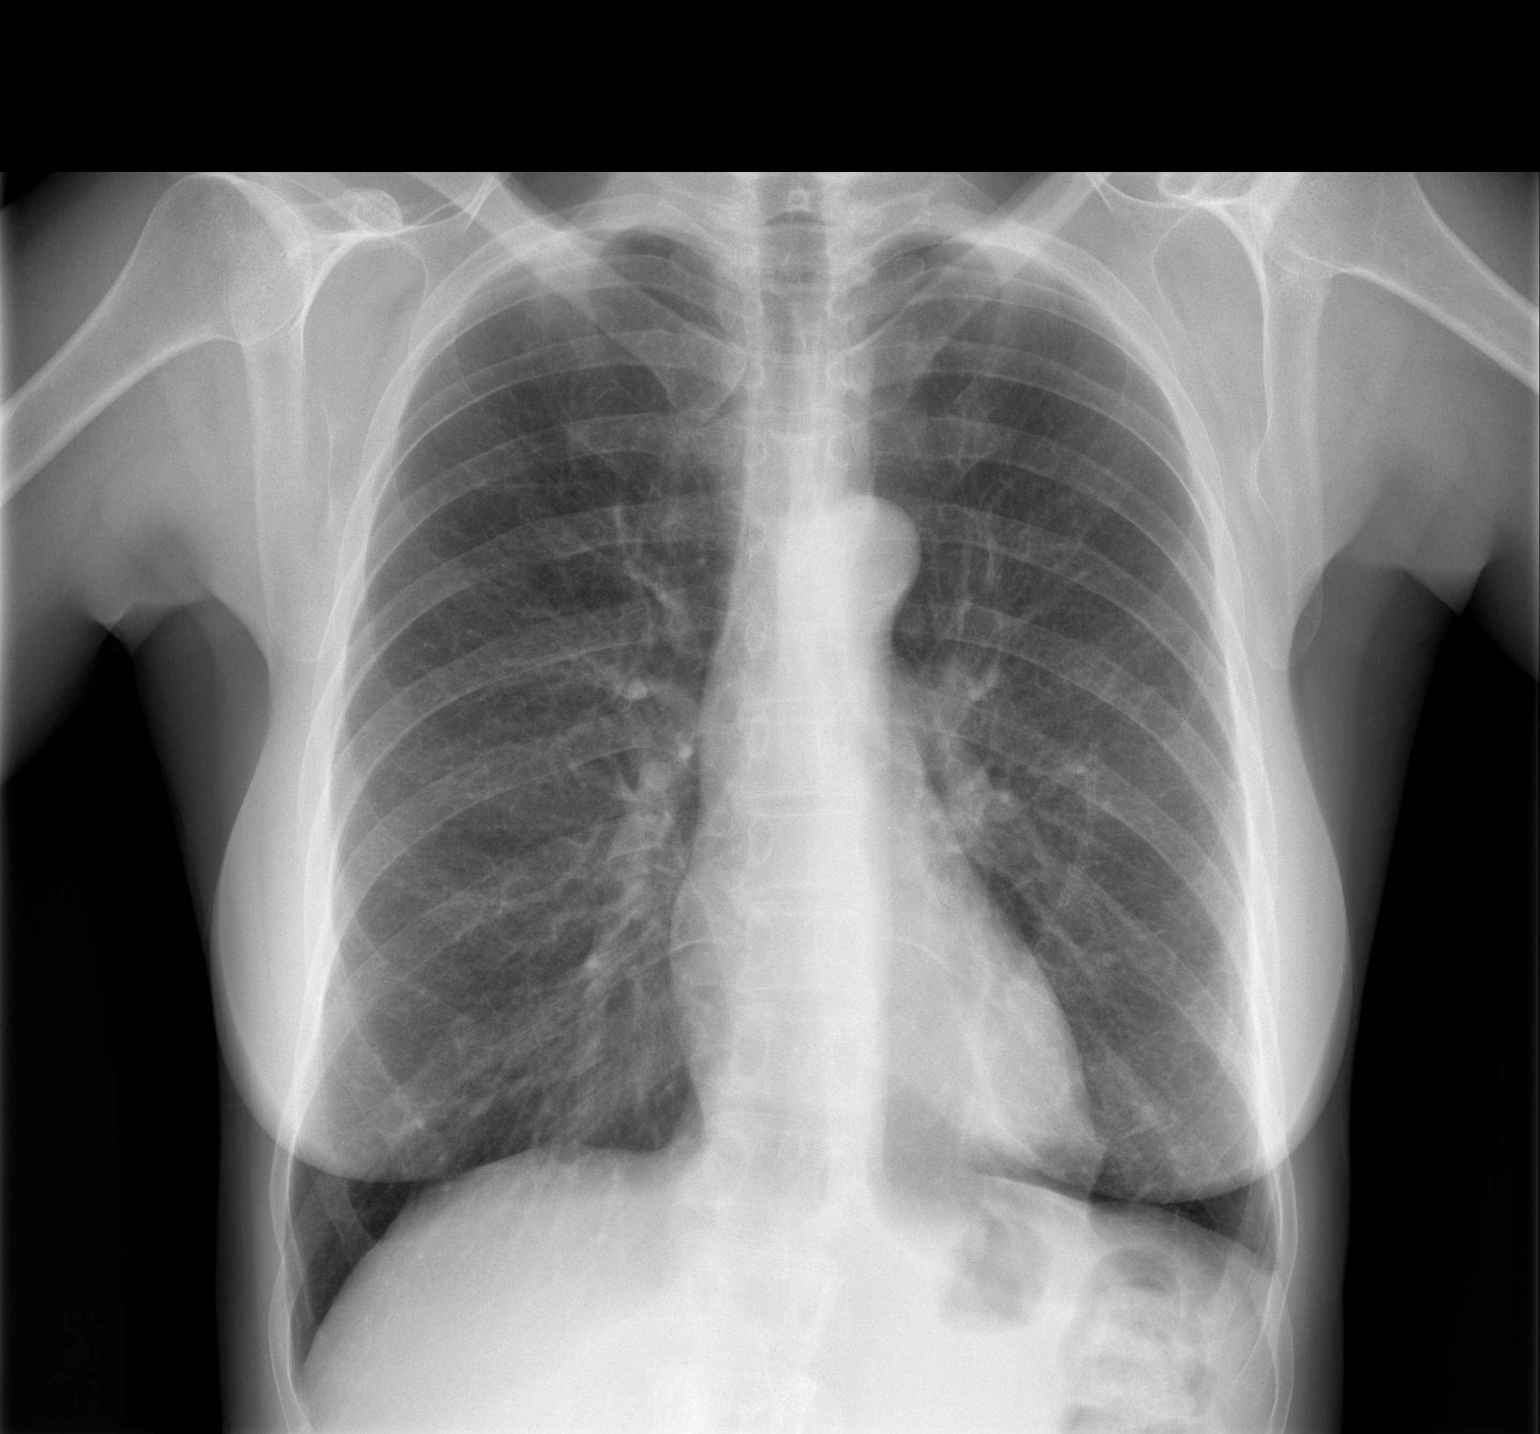

[w chest lat]
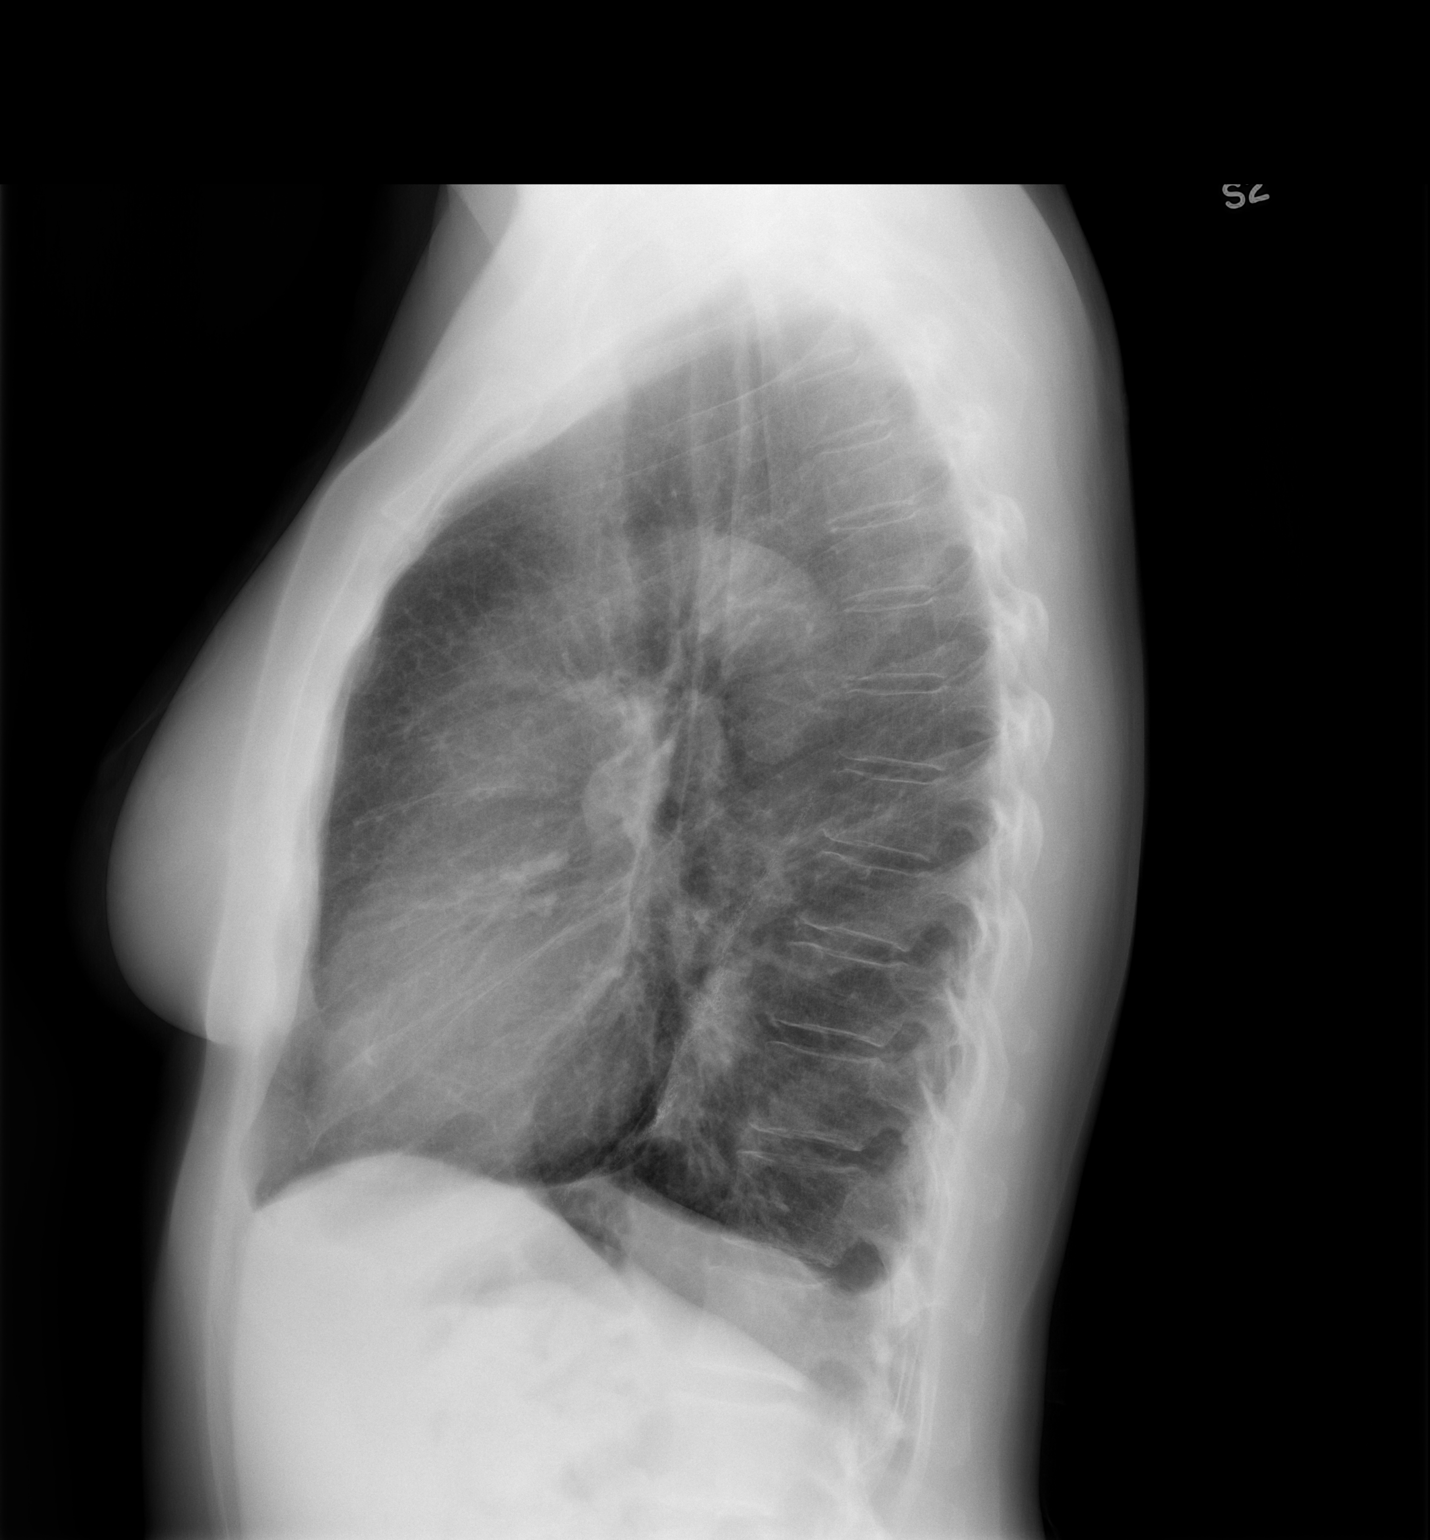

[2 of 2 positions shown; findings below may reference images not displayed]

FINDINGS: Scarring in the left lower lobe again noted, stable. Right lung is
clear. Heart is normal size. No effusions. No acute bony
abnormality.
IMPRESSION: No active cardiopulmonary disease.

## 2017-10-04 ENCOUNTER — Inpatient Hospital Stay: Payer: Medicaid Other | Admitting: Internal Medicine

## 2018-02-14 ENCOUNTER — Ambulatory Visit (INDEPENDENT_AMBULATORY_CARE_PROVIDER_SITE_OTHER): Payer: Medicaid Other

## 2018-02-14 ENCOUNTER — Other Ambulatory Visit: Payer: Self-pay

## 2018-02-14 ENCOUNTER — Ambulatory Visit (HOSPITAL_COMMUNITY): Payer: Medicaid Other

## 2018-02-14 ENCOUNTER — Ambulatory Visit (HOSPITAL_COMMUNITY)
Admission: EM | Admit: 2018-02-14 | Discharge: 2018-02-14 | Disposition: A | Discharge status: Home / Self Care | Payer: Medicaid Other | Attending: Family Medicine | Admitting: Family Medicine

## 2018-02-14 ENCOUNTER — Encounter (HOSPITAL_COMMUNITY): Payer: Self-pay | Admitting: Emergency Medicine

## 2018-02-14 DIAGNOSIS — M79642 Pain in left hand: Secondary | ICD-10-CM

## 2018-02-14 DIAGNOSIS — M79645 Pain in left finger(s): Secondary | ICD-10-CM | POA: Diagnosis not present
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
## 2018-02-14 NOTE — Discharge Instructions (Signed)
Please try buddy taping the fingers together  Please continue icing the finger  Please use tylenol and ibuprofen for the pain  Please follow up if your pain doesn't improve.

## 2018-02-14 NOTE — ED Triage Notes (Signed)
Pt states she fell on Friday and injured her left ring finger.  Pt has swelling to the finger and it is purple.  Pt states she has tried to get her rings off but is unable to due to the swelling and she states she wants the ring cut off.  Pt is tearful during triage.

## 2018-02-18 IMAGING — CT CT CHEST W/O CM
2 of 4 series · 15 of 36 positions shown, 18 images · non-contrast
Comparison: Plain films 05/07/2015. CT 11/20/2014. PET of
12/11/2014.

CLINICAL DATA: Left-sided lung cancer diagnosed [DATE]. Resection
only. Restaging.

EXAM:
CT CHEST WITHOUT CONTRAST
TECHNIQUE: Multidetector CT imaging of the chest was performed following the
standard protocol without IV contrast.

[Series 2: chest w/o st · axial · non-contrast · 0.74mm/px · z∈[-537,-267]mm · 12 of 161 slices shown, 15 images]
[im 13/161  mediastinal]
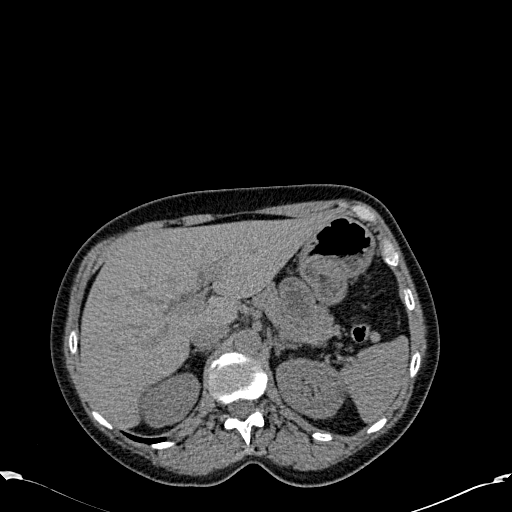
[im 13/161  lung]
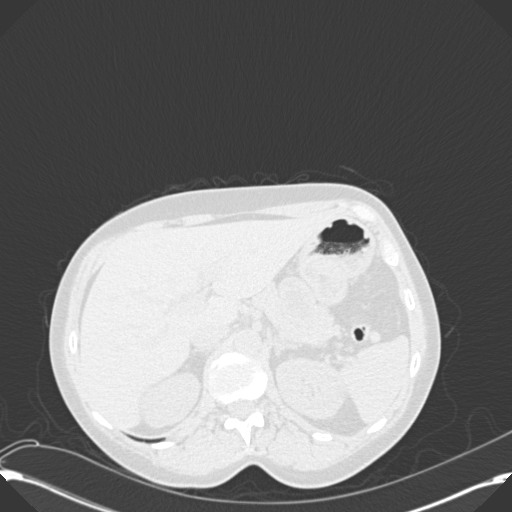
[im 25/161  lung]
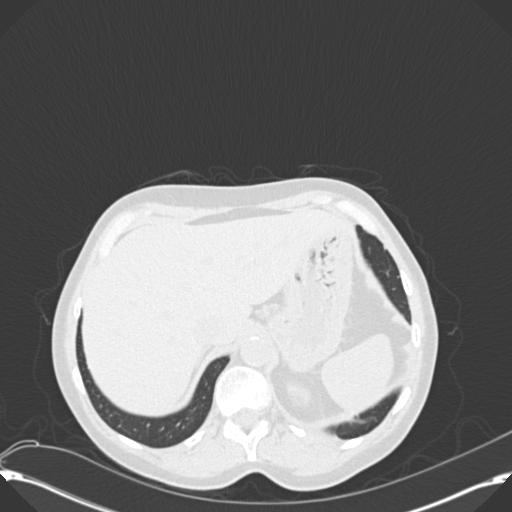
[im 37/161  lung]
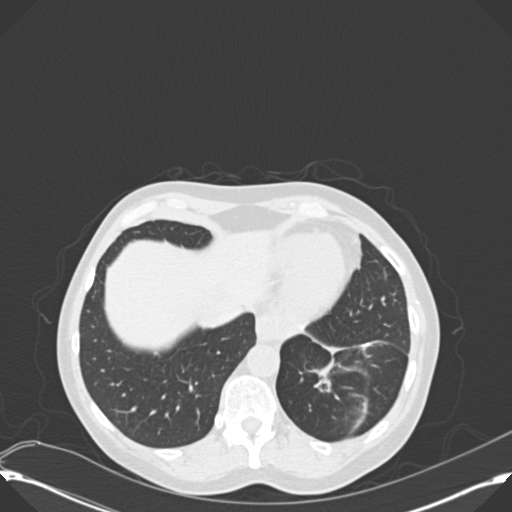
[im 50/161  lung]
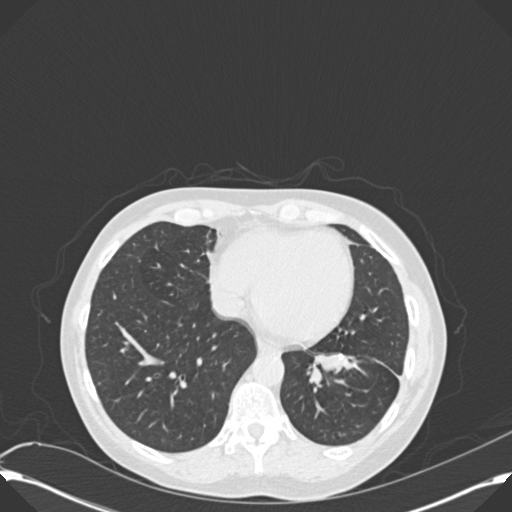
[im 62/161  mediastinal]
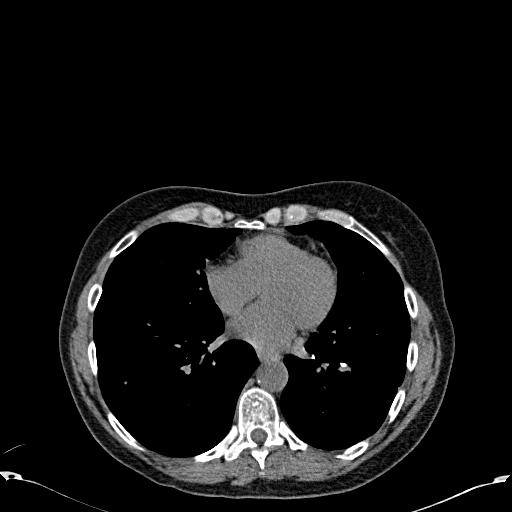
[im 62/161  lung]
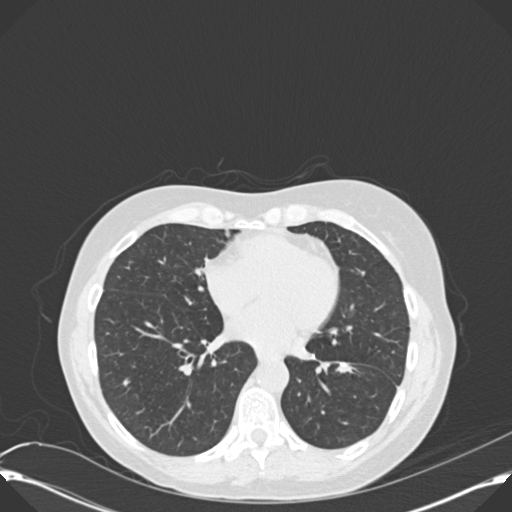
[im 74/161  lung]
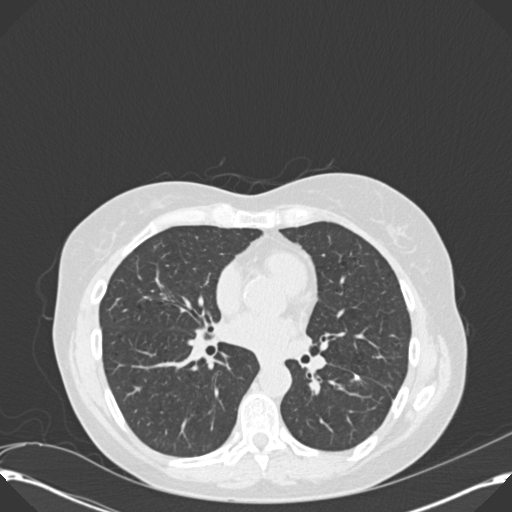
[im 87/161  lung]
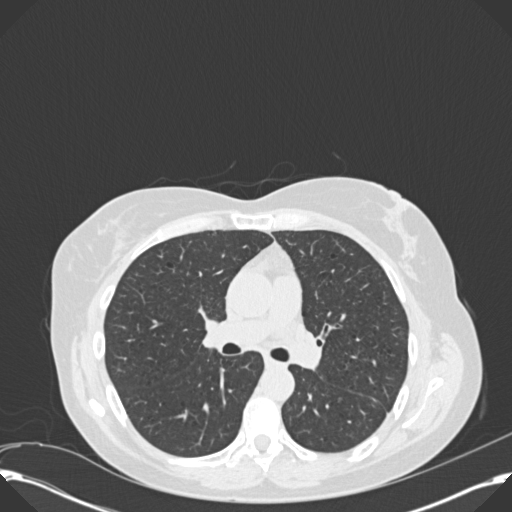
[im 99/161  lung]
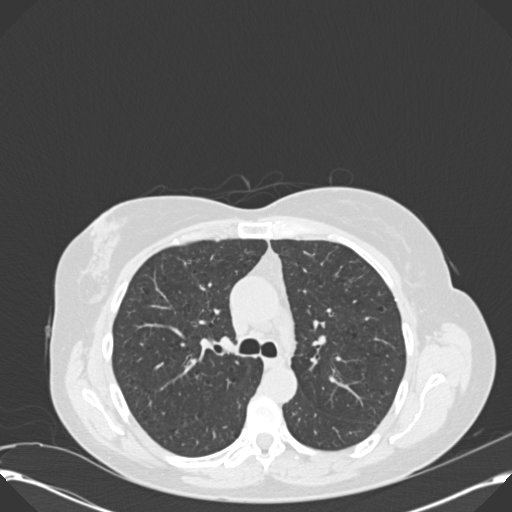
[im 111/161  mediastinal]
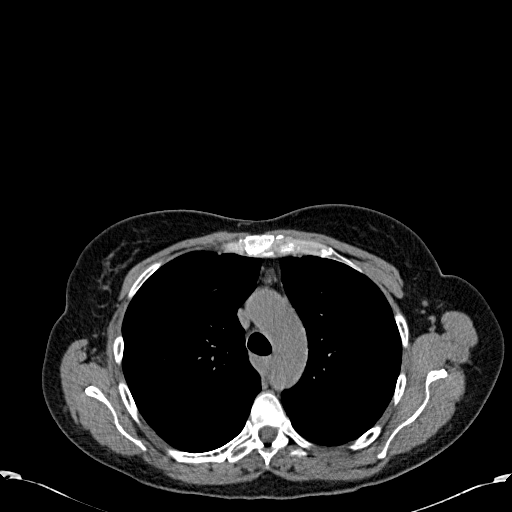
[im 111/161  lung]
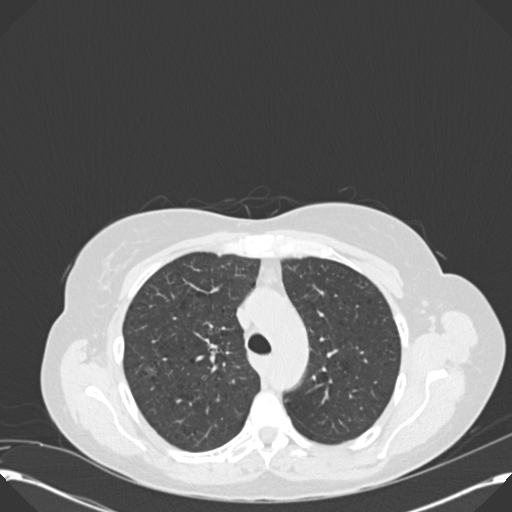
[im 124/161  lung]
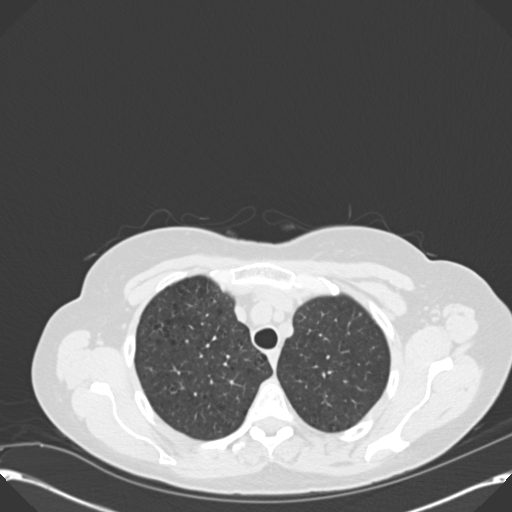
[im 136/161  lung]
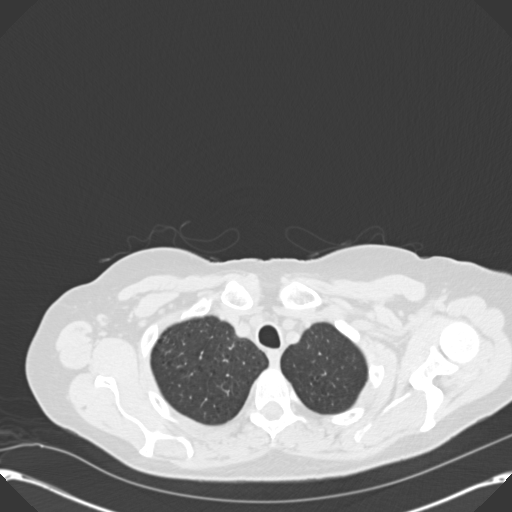
[im 148/161  lung]
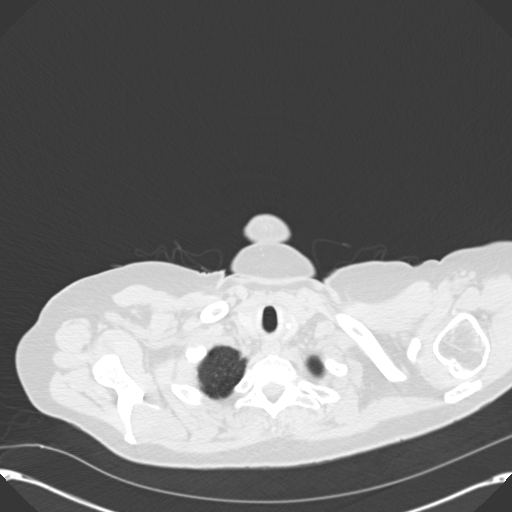

[Series 603: <mpr thick range(1)> · coronal · 0.74mm/px · 3 of 127 slices shown]
[im 26/127  lung]
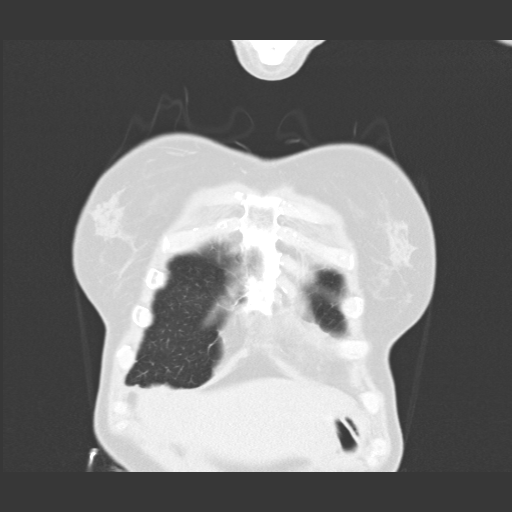
[im 51/127  lung]
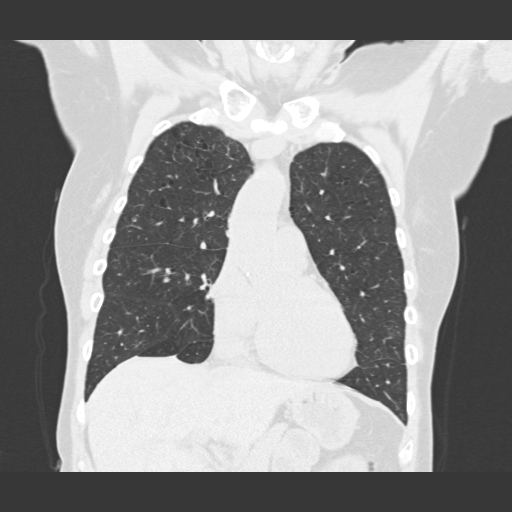
[im 76/127  lung]
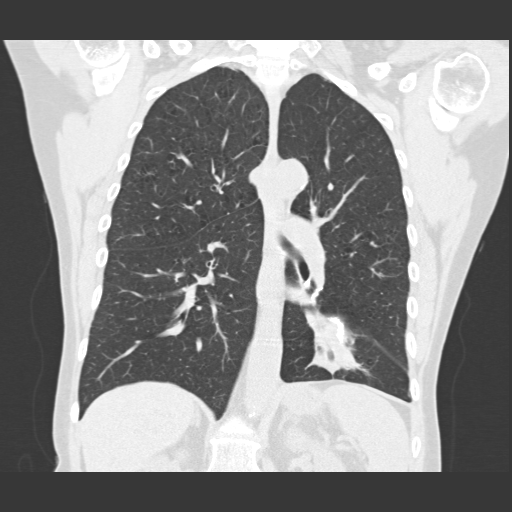

[15 of 36 positions shown; findings below may reference images not displayed]

FINDINGS: Mediastinum/Nodes: No supraclavicular adenopathy. Aortic and branch
vessel atherosclerosis. Normal heart size, without pericardial
effusion. Proximal LAD coronary artery atherosclerosis. No
mediastinal or definite hilar adenopathy, given limitations of
unenhanced CT.

Lungs/Pleura: No pleural fluid.  Moderate centrilobular emphysema.

Minimal right upper lobe nodularity on image 68/series 5 is
unchanged.

Subpleural medial right upper lobe 3 mm nodule on image 72/series 5
is also not significantly changed.

Focal right lower lobe bulla on image 84/series 5 is similar at 7
mm.

Volume loss and soft tissue thickening about the anterior aspect of
the left lower lobe is all felt to be postoperative. No well-defined
residual or recurrent disease identified. Example image 110/series
5.

Upper abdomen: Normal imaged portions of the liver, spleen, stomach,
pancreas, adrenal glands, right kidney. An upper pole left renal
fluid density lesion is consistent with a cyst.

Musculoskeletal: No acute osseous abnormality.
IMPRESSION: 1. Anterior left lower lobe volume loss consistent with wedge
resection. Soft tissue fullness in this area is felt to be related
to postoperative scarring. This can serve as a baseline for
follow-up.
2. Centrilobular emphysema with scattered pulmonary nodules, felt to
be similar. Recommend attention on follow-up.
3. Age advanced coronary artery atherosclerosis. Recommend
assessment of coronary risk factors and consideration of medical
therapy.

## 2018-03-23 ENCOUNTER — Ambulatory Visit: Payer: Medicaid Other

## 2018-03-23 ENCOUNTER — Encounter: Payer: Medicaid Other | Admitting: Internal Medicine

## 2018-04-27 ENCOUNTER — Encounter: Payer: Self-pay | Admitting: Internal Medicine

## 2018-04-27 ENCOUNTER — Encounter: Payer: Medicaid Other | Admitting: Internal Medicine
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
## 2018-05-17 ENCOUNTER — Other Ambulatory Visit: Payer: Self-pay | Admitting: Internal Medicine

## 2018-05-30 IMAGING — MR MR LUMBAR SPINE W/O CM
4 of 5 series · 18 of 48 positions shown · non-contrast
Comparison: PET-CT 12/11/2014. Lumbar radiographs 05/02/2014.
Lumbar MRI 04/20/2014, and earlier.

CLINICAL DATA: 55-year-old female with lumbar back pain radiating
to the left leg as far as the toes. Some weakness and numbness.
Symptoms for 3 years. Lumbar radiculopathy. Initial encounter.

EXAM:
MRI LUMBAR SPINE WITHOUT CONTRAST
TECHNIQUE: Multiplanar, multisequence MR imaging of the lumbar spine was
performed. No intravenous contrast was administered.

[Series 3: T1 · sagittal · 4.0mm · 0.51mm/px · 3 of 15 slices shown (1 of 2)]
[im 3/15]
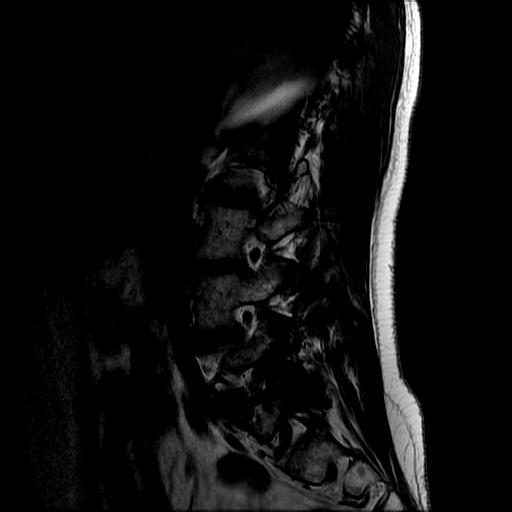
[im 9/15]
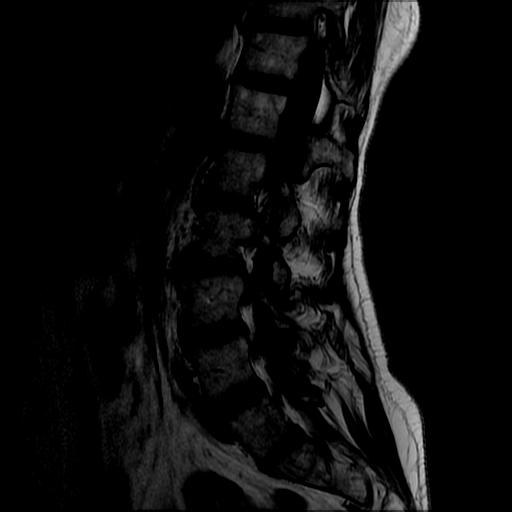
[im 15/15]
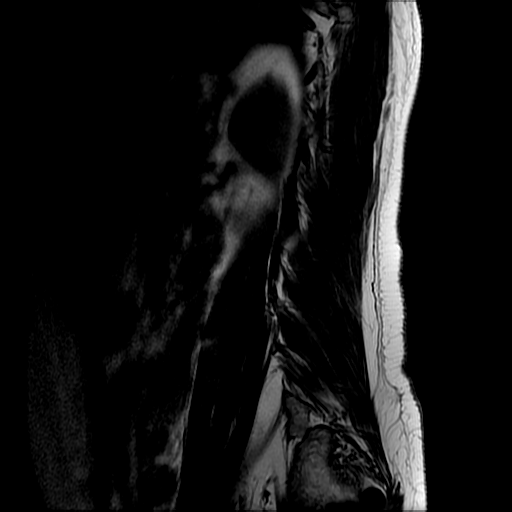

[Series 4: T2 post-contrast · sagittal · 4.0mm · 0.51mm/px · 6 of 15 slices shown]
[im 1/15]
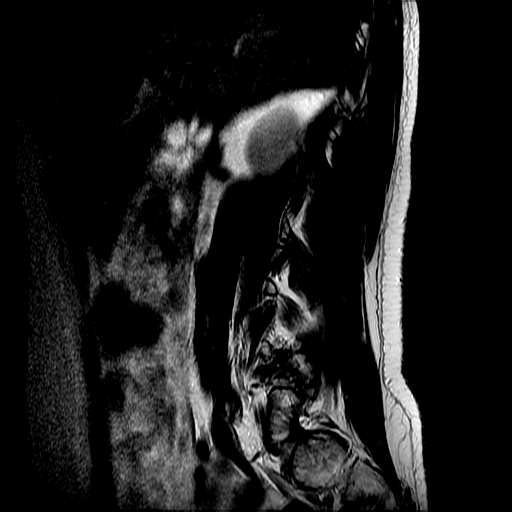
[im 3/15]
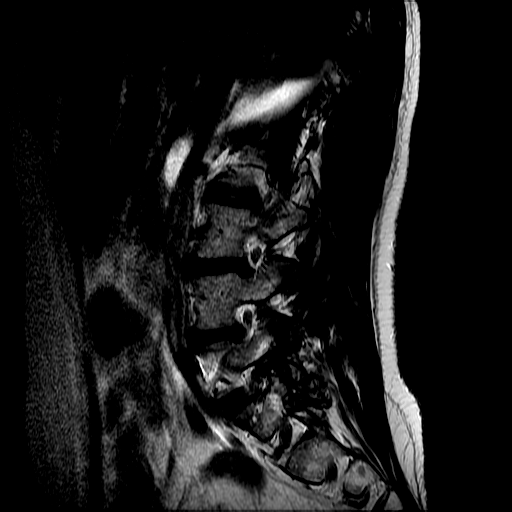
[im 6/15]
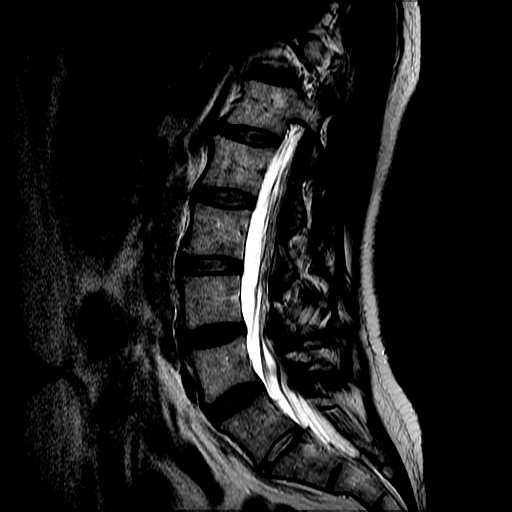
[im 9/15]
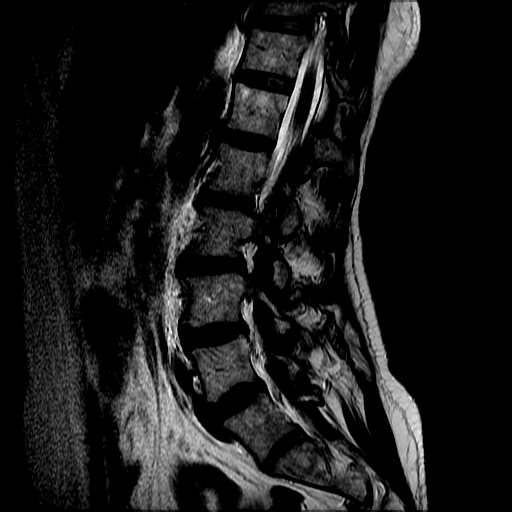
[im 12/15]
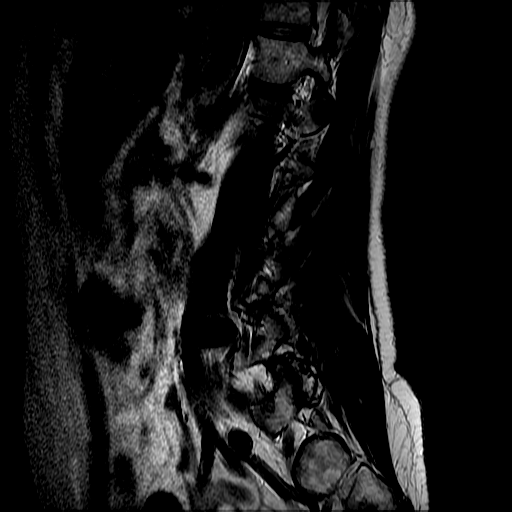
[im 15/15]
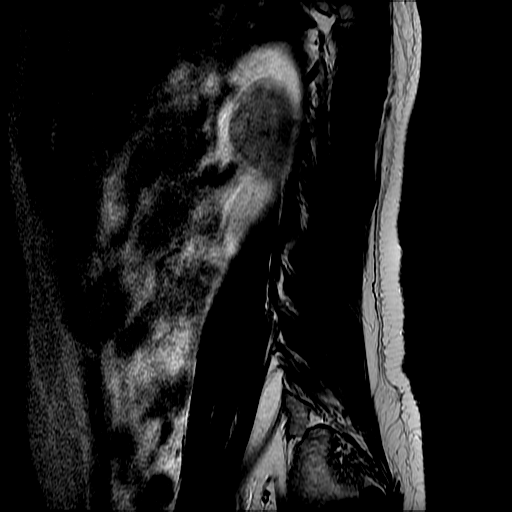

[Series 6: T2 · axial · 4.0mm · 0.39mm/px · z∈[-115,+31]mm · 6 of 35 slices shown]
[im 1/35]
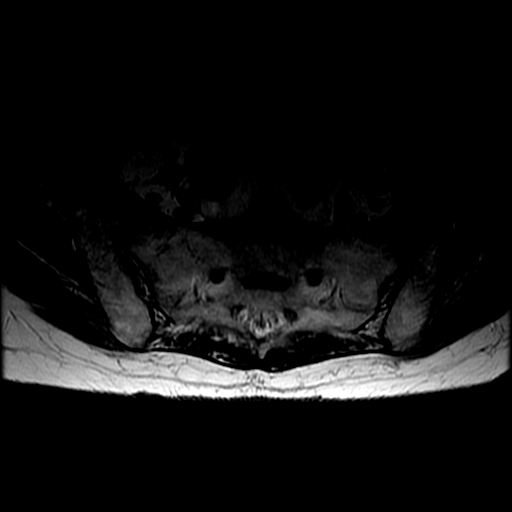
[im 5/35]
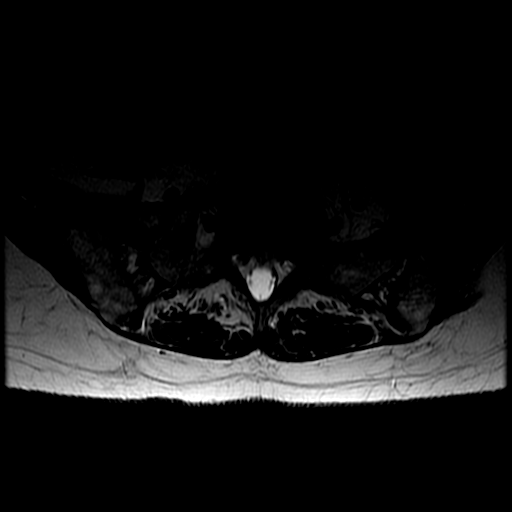
[im 10/35]
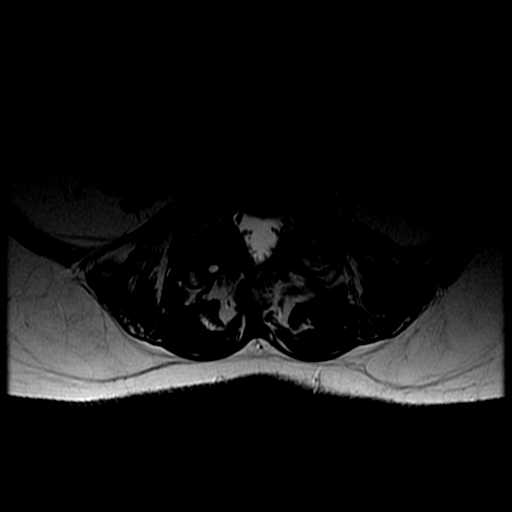
[im 15/35]
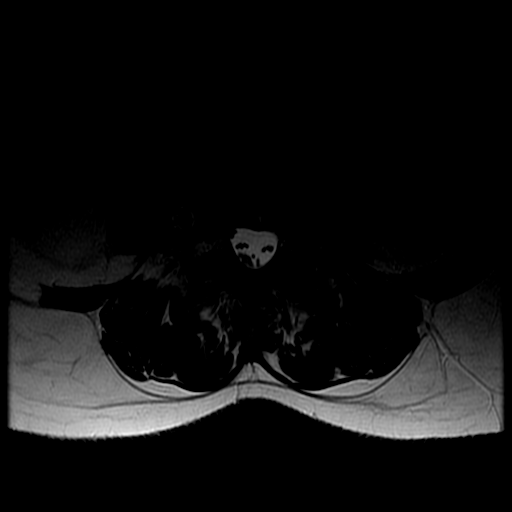
[im 18/35]
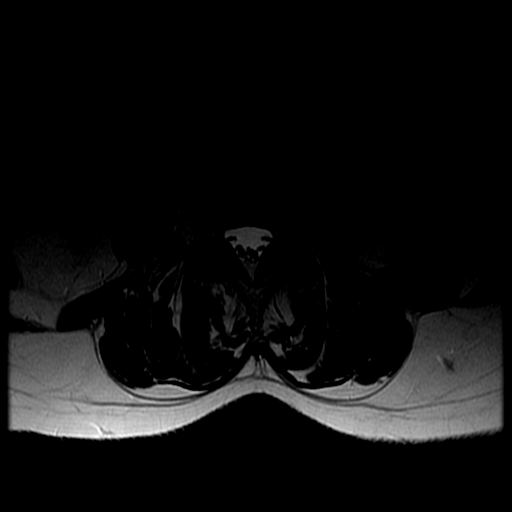
[im 30/35]
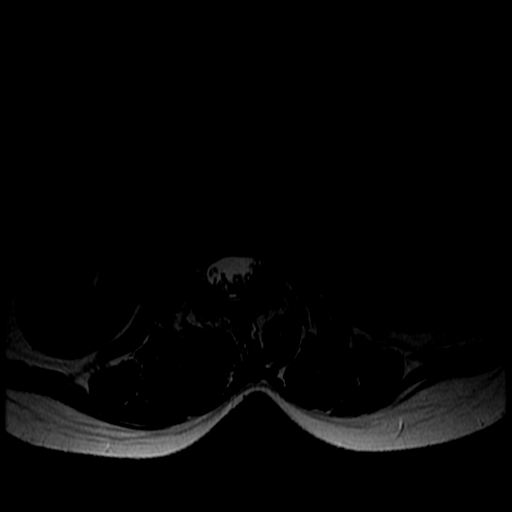

[Series 7: T1 · axial · 4.0mm · 0.39mm/px · z∈[-95,+31]mm · 3 of 35 slices shown (2 of 2)]
[im 5/35]
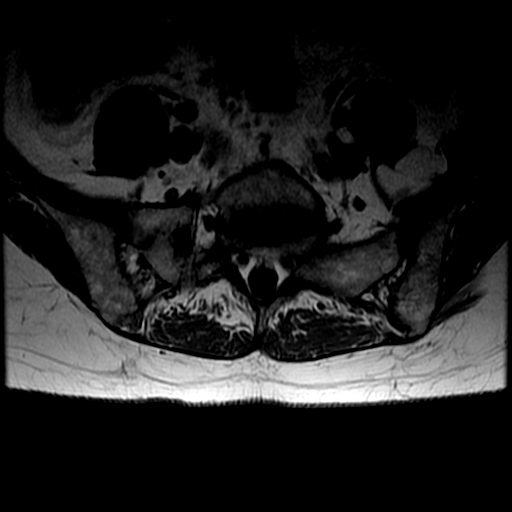
[im 18/35]
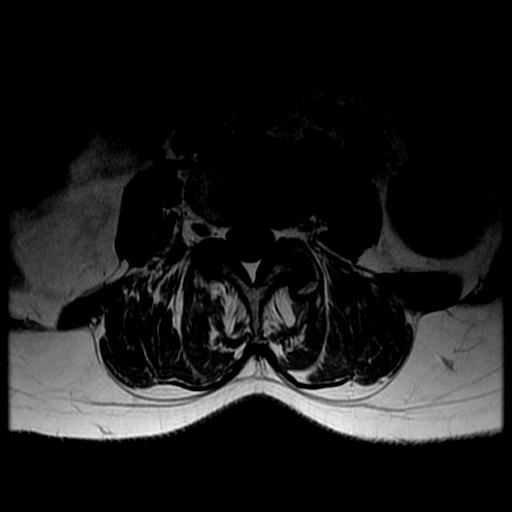
[im 30/35]
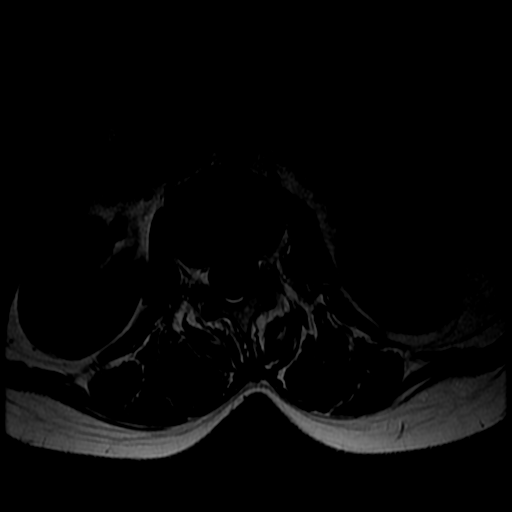

[18 of 48 positions shown; findings below may reference images not displayed]

FINDINGS: Segmentation: Same numbering system used as on the previous MRIs
designating the lowest full size ribs at T11, and sacral is a shin
of the L5 level with a a small normal L5-S1 disc space. Node but
this is a different numbering system than on the 8985 radiographs.

Alignment: Mild dextro convex lumbar scoliosis with a rotatory
component. Stable vertebral height and alignment since 8985.

Vertebrae: Continued mild to moderate marrow edema in the L4 lamina
and right L4 and L5 posterior elements. As before, this appears
degenerative in nature. The previously seen left side marrow edema
has resolved. Underlying chronic facet arthropathy (see below). No
acute osseous abnormality identified.

Conus medullaris: Extends to the L1-L2 level and appears normal.

Paraspinal and other soft tissues: Visualized abdominal viscera and
paraspinal soft tissues are within normal limits.

Disc levels:

T10-T11:  Stable mild facet hypertrophy.

T11-12:  Stable mild facet hypertrophy on the right.

T12-L1:  Stable minimal disc bulge and facet hypertrophy.

L1-L2:  Stable borderline to mild facet hypertrophy.

L2-L3: Stable mild far lateral disc bulging, and moderate facet
hypertrophy. No significant stenosis.

L3-L4: Stable mild mostly far lateral disc bulging. Chronic moderate
facet and ligament flavum hypertrophy appears stable. Decreased left
facet joint fluid. No significant stenosis.

L4-L5: Stable mild far lateral disc bulging greater on the right.
Chronic severe facet hypertrophy worse on the right. Small
posteriorly situated synovial cyst (series 6, image [DATE] be new
but should not cause neural compromise. No spinal or lateral recess
stenosis. Stable mild to moderate right L4 foraminal stenosis.

L5-S1:  Sacralized.  Mild facet hypertrophy.  No stenosis.
IMPRESSION: 1. Transitional lumbosacral anatomy. Same numbering system as on the
prior MRIs. Correlation with radiographs is recommended prior to any
operative intervention.
2. Chronic moderate to severe facet arthropathy at L3-L4 and L4-L5.
Continued degenerative appearing marrow edema affecting the right
side facet at those levels, although the left side marrow edema has
resolved.
3. Chronic mild to moderate right L4 foraminal stenosis. No spinal
stenosis or interval increased neural impingement.

## 2018-08-19 IMAGING — CT CT CHEST W/ CM
2 of 3 series · 15 of 36 positions shown, 18 images · IV contrast (ISOVUE 300)
Comparison: 09/23/2015

CLINICAL DATA: Followup left lower lobe lung carcinoma. Previous
surgery. Restaging.

EXAM:
CT CHEST WITH CONTRAST
TECHNIQUE: Multidetector CT imaging of the chest was performed during
intravenous contrast administration.
CONTRAST:  75mL [id number] IOPAMIDOL ([id number]) INJECTION 61%

[Series 2: axial st · axial · 0.63mm/px · z∈[-294,-18]mm · 12 of 163 slices shown, 15 images]
[im 13/163  mediastinal]
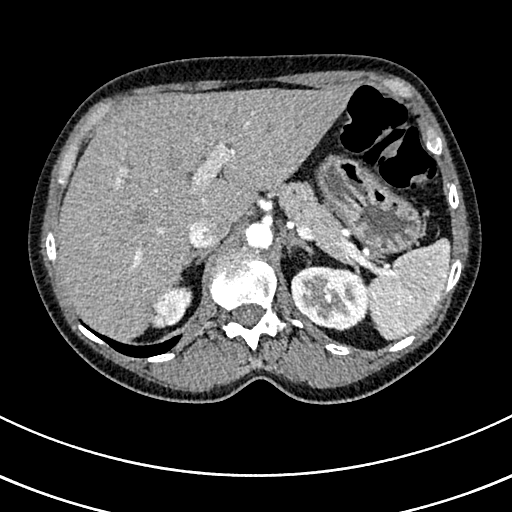
[im 13/163  lung]
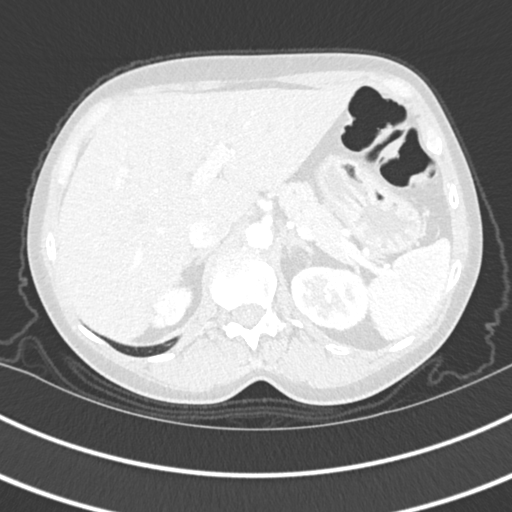
[im 25/163  lung]
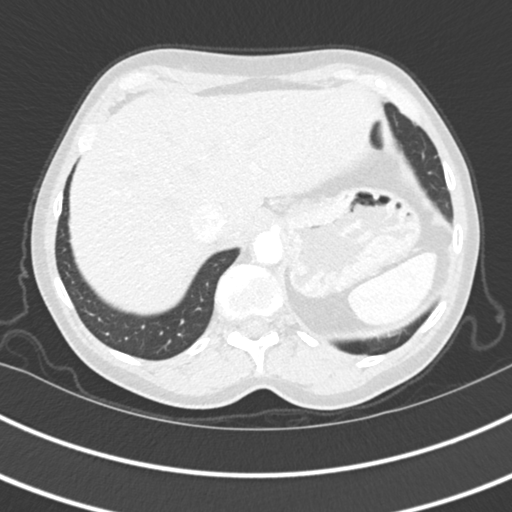
[im 37/163  lung]
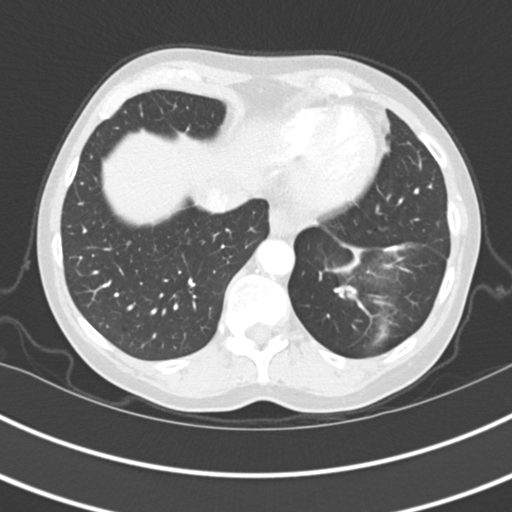
[im 49/163  lung]
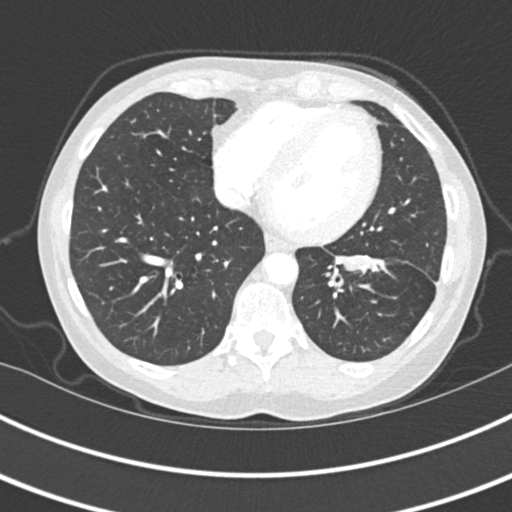
[im 61/163  mediastinal]
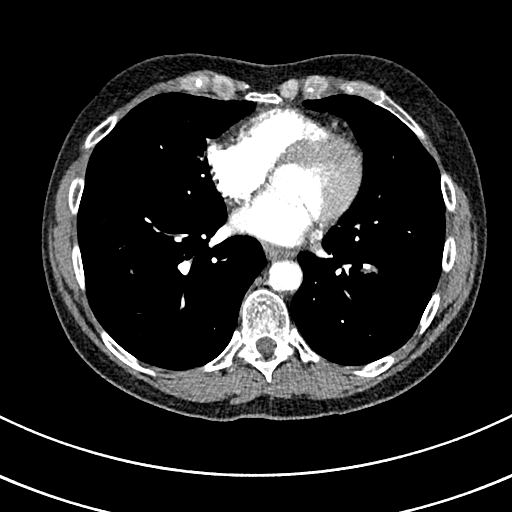
[im 61/163  lung]
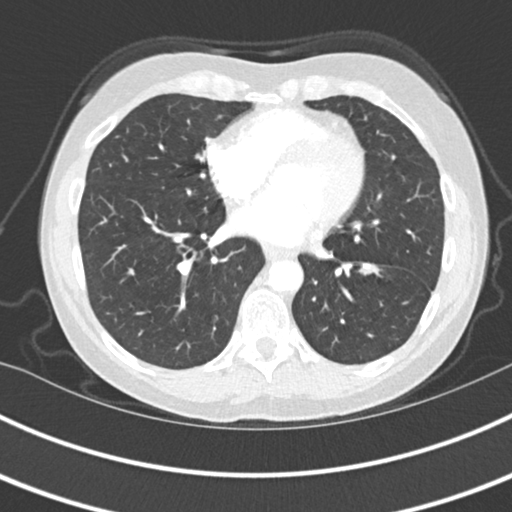
[im 73/163  lung]
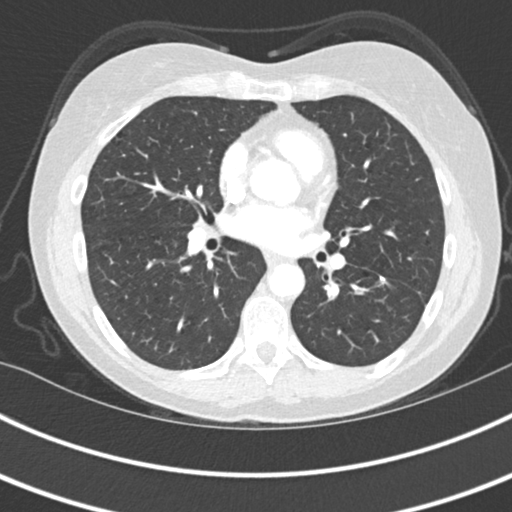
[im 91/163  lung]
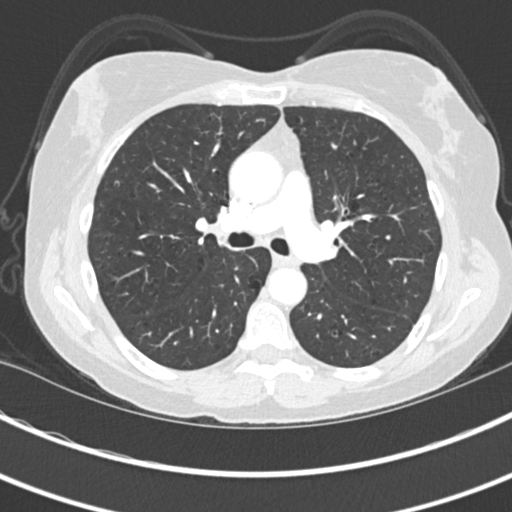
[im 103/163  lung]
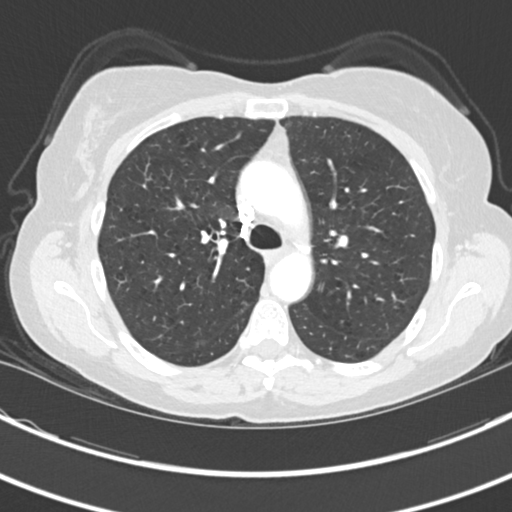
[im 115/163  mediastinal]
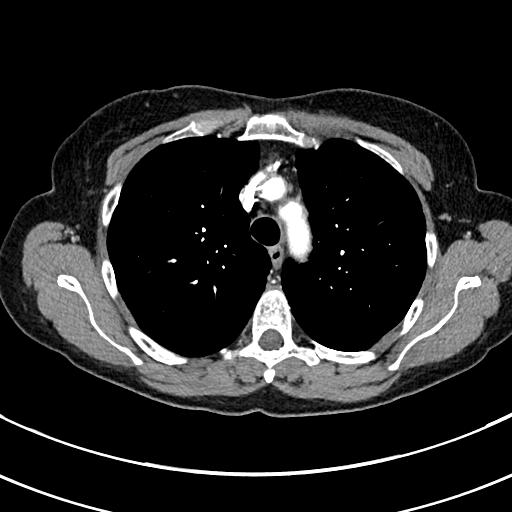
[im 115/163  lung]
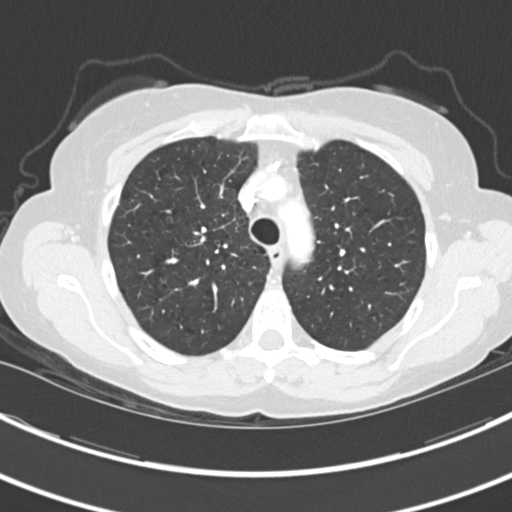
[im 127/163  lung]
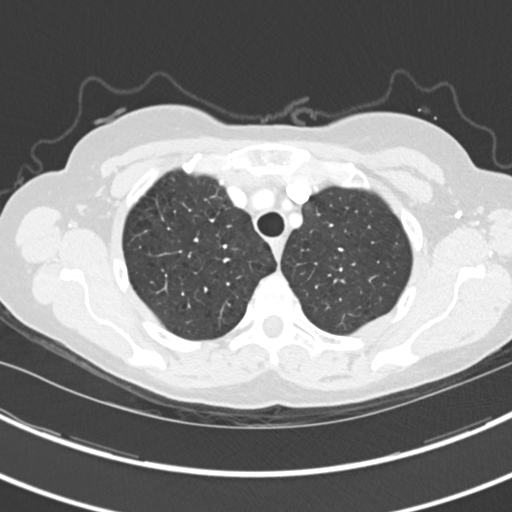
[im 139/163  lung]
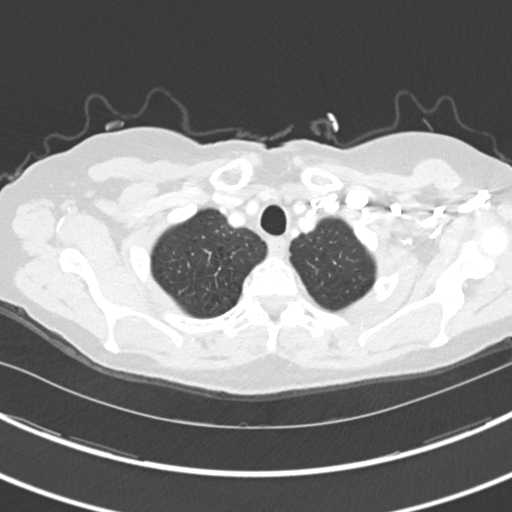
[im 151/163  lung]
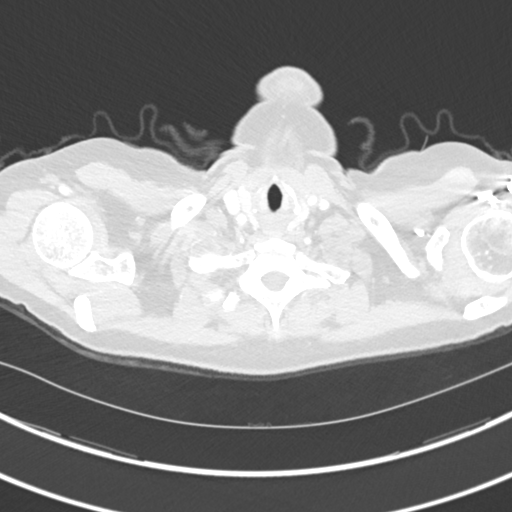

[Series 5: coronal · coronal · 0.64mm/px · 3 of 125 slices shown]
[im 25/125  lung]
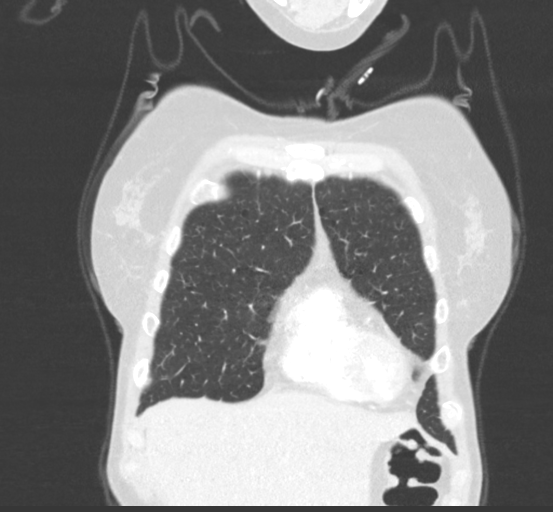
[im 50/125  lung]
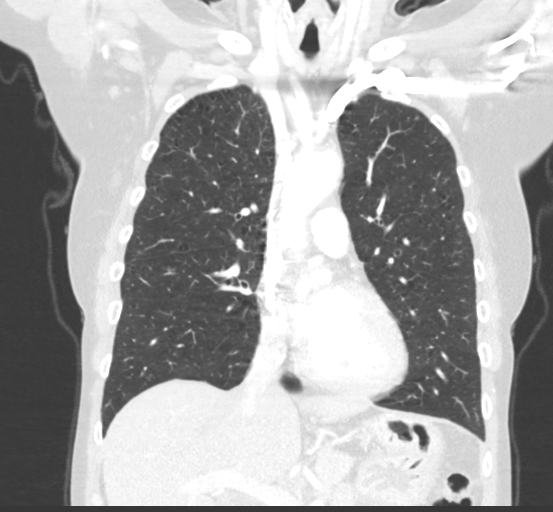
[im 75/125  lung]
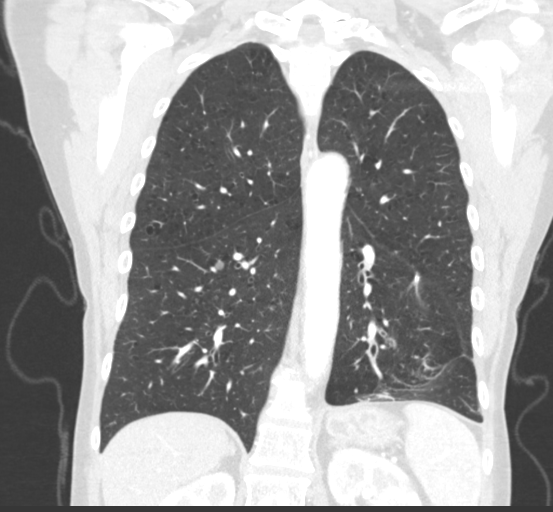

[15 of 36 positions shown; findings below may reference images not displayed]

FINDINGS: Cardiovascular:  No acute findings. Aortic atherosclerosis.

Mediastinum/Nodes: No masses or pathologically enlarged lymph nodes
identified.

Lungs/Pleura: Mild emphysema again noted. Soft tissue density
surrounding surgical staples in the antral basal aspect of the left
lower lobe remains stable, consistent with postop changes. No
suspicious pulmonary nodules or masses are identified. No evidence
of pleural effusion.

Upper Abdomen:  Normal adrenal glands.  Stable left renal cyst.

Musculoskeletal:  No suspicious bone lesions.
IMPRESSION: Stable post surgical changes in left lower lobe. No evidence of
recurrent or metastatic carcinoma within the thorax.

Mild emphysema.

Aortic atherosclerosis.

## 2018-09-11 ENCOUNTER — Encounter: Payer: Self-pay | Admitting: *Deleted

## 2018-10-23 ENCOUNTER — Emergency Department (HOSPITAL_COMMUNITY): Payer: Medicaid Other

## 2018-10-23 ENCOUNTER — Other Ambulatory Visit: Payer: Self-pay

## 2018-10-23 ENCOUNTER — Emergency Department (HOSPITAL_COMMUNITY)
Admission: EM | Admit: 2018-10-23 | Discharge: 2018-10-23 | Disposition: A | Discharge status: Home / Self Care | Payer: Medicaid Other | Attending: Emergency Medicine | Admitting: Emergency Medicine

## 2018-10-23 DIAGNOSIS — F1123 Opioid dependence with withdrawal: Secondary | ICD-10-CM | POA: Insufficient documentation

## 2018-10-23 DIAGNOSIS — Z951 Presence of aortocoronary bypass graft: Secondary | ICD-10-CM | POA: Diagnosis not present

## 2018-10-23 DIAGNOSIS — I2510 Atherosclerotic heart disease of native coronary artery without angina pectoris: Secondary | ICD-10-CM | POA: Insufficient documentation

## 2018-10-23 DIAGNOSIS — Z79899 Other long term (current) drug therapy: Secondary | ICD-10-CM | POA: Insufficient documentation

## 2018-10-23 DIAGNOSIS — I110 Hypertensive heart disease with heart failure: Secondary | ICD-10-CM | POA: Insufficient documentation

## 2018-10-23 DIAGNOSIS — F17210 Nicotine dependence, cigarettes, uncomplicated: Secondary | ICD-10-CM | POA: Insufficient documentation

## 2018-10-23 DIAGNOSIS — Z8673 Personal history of transient ischemic attack (TIA), and cerebral infarction without residual deficits: Secondary | ICD-10-CM | POA: Diagnosis not present

## 2018-10-23 DIAGNOSIS — R197 Diarrhea, unspecified: Secondary | ICD-10-CM | POA: Diagnosis not present

## 2018-10-23 DIAGNOSIS — R112 Nausea with vomiting, unspecified: Secondary | ICD-10-CM | POA: Diagnosis present

## 2018-10-23 DIAGNOSIS — Z85118 Personal history of other malignant neoplasm of bronchus and lung: Secondary | ICD-10-CM | POA: Diagnosis not present

## 2018-10-23 DIAGNOSIS — I509 Heart failure, unspecified: Secondary | ICD-10-CM | POA: Insufficient documentation

## 2018-10-23 DIAGNOSIS — E785 Hyperlipidemia, unspecified: Secondary | ICD-10-CM | POA: Diagnosis not present

## 2018-10-23 DIAGNOSIS — Z7982 Long term (current) use of aspirin: Secondary | ICD-10-CM | POA: Insufficient documentation

## 2018-10-23 DIAGNOSIS — F1193 Opioid use, unspecified with withdrawal: Secondary | ICD-10-CM

## 2018-10-23 LAB — CBC WITH DIFFERENTIAL/PLATELET
Abs Immature Granulocytes: 0.04 10*3/uL (ref 0.00–0.07)
Basophils Absolute: 0 10*3/uL (ref 0.0–0.1)
Basophils Relative: 0 %
Eosinophils Absolute: 0.1 10*3/uL (ref 0.0–0.5)
Eosinophils Relative: 1 %
HCT: 44.8 % (ref 36.0–46.0)
Hemoglobin: 15.4 g/dL — ABNORMAL HIGH (ref 12.0–15.0)
Immature Granulocytes: 0 %
Lymphocytes Relative: 20 %
Lymphs Abs: 2.1 10*3/uL (ref 0.7–4.0)
MCH: 30.6 pg (ref 26.0–34.0)
MCHC: 34.4 g/dL (ref 30.0–36.0)
MCV: 89.1 fL (ref 80.0–100.0)
Monocytes Absolute: 0.4 10*3/uL (ref 0.1–1.0)
Monocytes Relative: 4 %
Neutro Abs: 8 10*3/uL — ABNORMAL HIGH (ref 1.7–7.7)
Neutrophils Relative %: 75 %
Platelets: 319 10*3/uL (ref 150–400)
RBC: 5.03 MIL/uL (ref 3.87–5.11)
RDW: 13.2 % (ref 11.5–15.5)
WBC: 10.8 10*3/uL — ABNORMAL HIGH (ref 4.0–10.5)
nRBC: 0 % (ref 0.0–0.2)

## 2018-10-23 LAB — URINALYSIS, ROUTINE W REFLEX MICROSCOPIC
Bilirubin Urine: NEGATIVE
Glucose, UA: NEGATIVE mg/dL
Ketones, ur: 5 mg/dL — AB
Leukocytes,Ua: NEGATIVE
Nitrite: POSITIVE — AB
Protein, ur: 30 mg/dL — AB
Specific Gravity, Urine: 1.015 (ref 1.005–1.030)
pH: 6 (ref 5.0–8.0)

## 2018-10-23 LAB — LIPASE, BLOOD: Lipase: 45 U/L (ref 11–51)

## 2018-10-23 LAB — COMPREHENSIVE METABOLIC PANEL
ALT: 15 U/L (ref 0–44)
AST: 26 U/L (ref 15–41)
Albumin: 4.2 g/dL (ref 3.5–5.0)
Alkaline Phosphatase: 99 U/L (ref 38–126)
Anion gap: 13 (ref 5–15)
BUN: 17 mg/dL (ref 6–20)
CO2: 19 mmol/L — ABNORMAL LOW (ref 22–32)
Calcium: 9.6 mg/dL (ref 8.9–10.3)
Chloride: 106 mmol/L (ref 98–111)
Creatinine, Ser: 0.77 mg/dL (ref 0.44–1.00)
GFR calc Af Amer: >60 mL/min (ref >60)
GFR calc non Af Amer: >60 mL/min (ref >60)
Glucose, Bld: 100 mg/dL — ABNORMAL HIGH (ref 70–99)
Potassium: 4 mmol/L (ref 3.5–5.1)
Sodium: 138 mmol/L (ref 135–145)
Total Bilirubin: 0.9 mg/dL (ref 0.3–1.2)
Total Protein: 9.7 g/dL — ABNORMAL HIGH (ref 6.5–8.1)

## 2018-10-23 LAB — ETHANOL: Alcohol, Ethyl (B): <10 mg/dL (ref <10)

## 2018-10-23 LAB — RAPID URINE DRUG SCREEN, HOSP PERFORMED
Amphetamines: NOT DETECTED
Barbiturates: NOT DETECTED
Benzodiazepines: NOT DETECTED
Cocaine: NOT DETECTED
Opiates: NOT DETECTED
Tetrahydrocannabinol: NOT DETECTED

## 2018-10-23 MED ORDER — SODIUM CHLORIDE 0.9 % IV BOLUS
1000.0000 mL | Freq: Once | INTRAVENOUS | Status: AC
  Administered 2018-10-23: 1000 mL via INTRAVENOUS

## 2018-10-23 MED ORDER — CEPHALEXIN 500 MG PO CAPS: 500.0000 mg | ORAL_CAPSULE | Freq: Two times a day (BID) | ORAL | 0 refills | Status: DC

## 2018-10-23 MED ORDER — CEPHALEXIN 500 MG PO CAPS: 500.0000 mg | ORAL_CAPSULE | Freq: Two times a day (BID) | ORAL | 0 refills | Status: AC

## 2018-10-23 MED ORDER — LORAZEPAM 2 MG/ML IJ SOLN
1.0000 mg | Freq: Once | INTRAMUSCULAR | Status: AC
  Administered 2018-10-23: 1 mg via INTRAVENOUS
  Filled 2018-10-23: qty 1

## 2018-10-23 MED ORDER — MORPHINE SULFATE (PF) 4 MG/ML IV SOLN
4.0000 mg | Freq: Once | INTRAVENOUS | Status: AC
  Administered 2018-10-23: 4 mg via INTRAVENOUS
  Filled 2018-10-23: qty 1

## 2018-10-23 MED ORDER — DIAZEPAM 2 MG PO TABS: 2.0000 mg | ORAL_TABLET | Freq: Three times a day (TID) | ORAL | 0 refills | Status: DC | PRN

## 2018-10-23 MED ORDER — SODIUM CHLORIDE 0.9 % IV SOLN
1.0000 g | Freq: Once | INTRAVENOUS | Status: AC
  Administered 2018-10-23: 1 g via INTRAVENOUS
  Filled 2018-10-23: qty 10
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
## 2018-10-23 NOTE — ED Notes (Signed)
Patient verbalizes understanding of discharge instructions. Opportunity for questioning and answers were provided. Armband removed by staff, pt discharged from ED.  

## 2018-10-23 NOTE — ED Triage Notes (Signed)
Pt brought in by [facility] from home for abdominal pain, NV, and withdrawal symptoms since midnight last night. Pt states she stopped using heroin yesterday. Endorses using heroin x1 year. Pt states she no longer has the money to use heroin, states "I don't want to deal with the dealers no more, I don't want to use the stuff no more". Pt restless in bed, skin warm and dry.

## 2018-10-23 NOTE — Discharge Instructions (Addendum)
Please be sure to take advantage of the resources provided tonight to go to a counseling center tomorrow for assistance with opiates and opiate withdrawal.  You have been provided a prescription to assist with withdrawal symptoms. However, it is important that you use this medication only as needed.  Return here for concerning changes in your condition.

## 2018-10-23 NOTE — ED Notes (Signed)
Pt keep to continue to press call light and doesn't say anything. Then screams out of room that nobody is helping her.

## 2018-10-23 NOTE — ED Notes (Signed)
Pt called out for pain meds

## 2018-10-29 IMAGING — CR DG CHEST 2V
2 series · 2 of 2 positions shown · non-contrast
Comparison: CT chest of 03/23/2016 and chest x-ray of 05/07/2015

CLINICAL DATA: Mid and right-sided chest pain, cough, shortness of
breath over the last 2 days, smoking history, history of prior left
lower lobe anterior basilar segmentectomy for lung carcinoma in 9430

EXAM:
CHEST  2 VIEW

[chest lat]
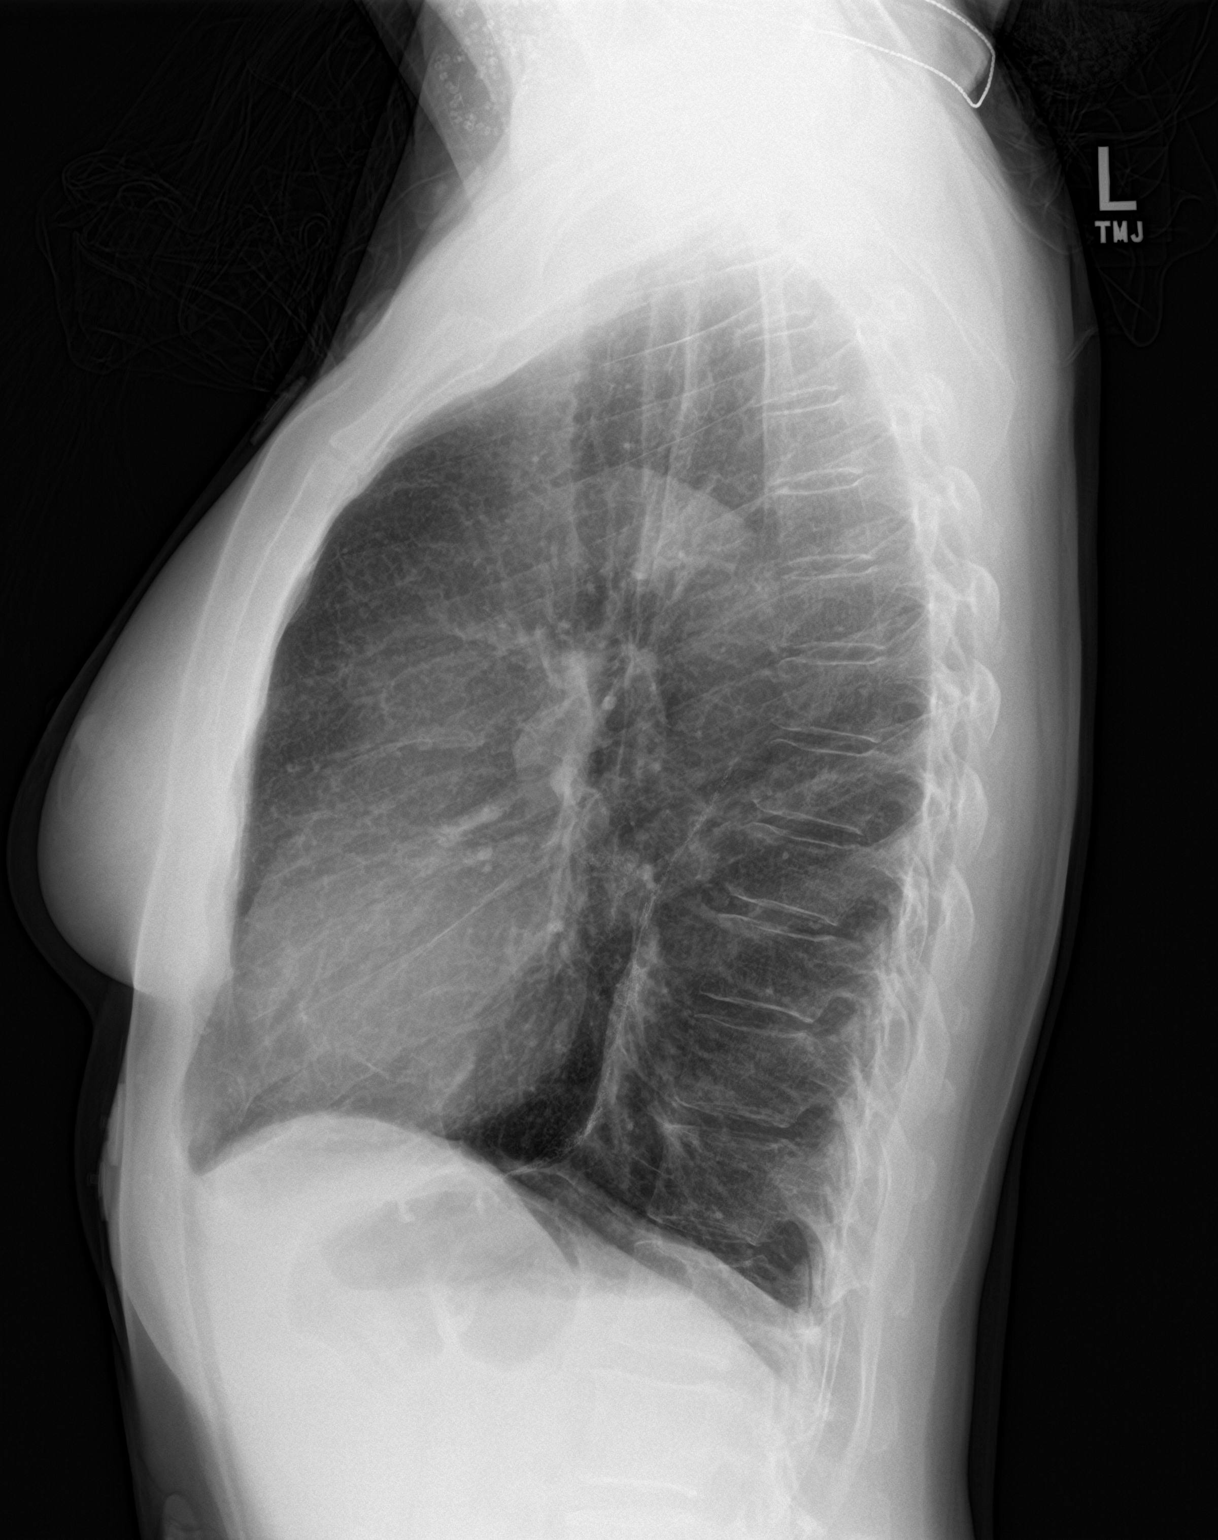

[chest pa]
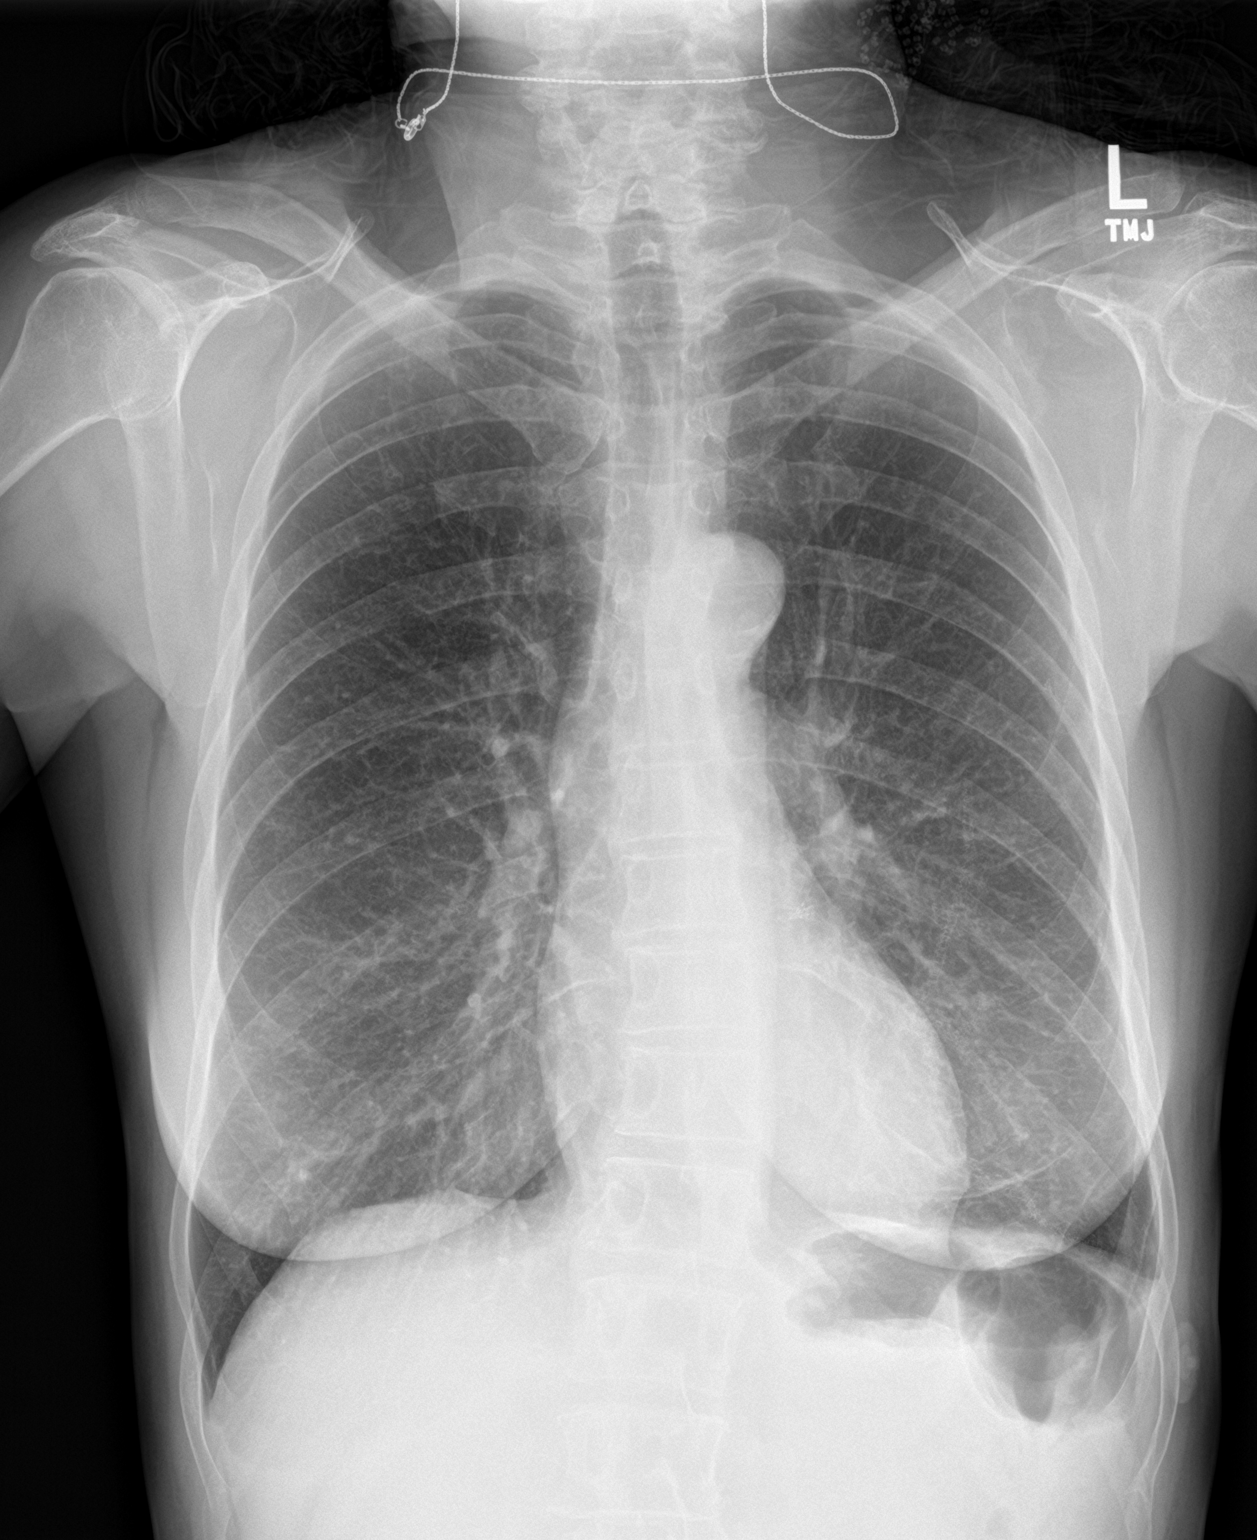

[2 of 2 positions shown; findings below may reference images not displayed]

FINDINGS: Surgical sutures are noted overlying the left infrahilar region. The
lungs are otherwise clear and slightly hyperaerated. Mediastinal and
hilar contours are unremarkable. The heart is within normal limits
in size. No acute bony abnormality is seen.
IMPRESSION: Stable postoperative change involving the left lower lobe. Slight
hyper aeration. No active lung disease.

## 2018-10-29 IMAGING — CT CT ANGIO CHEST
2 of 6 series · 18 of 36 positions shown · IV contrast (Omni 300)
Comparison: Chest x-ray of 06/02/2016 and CT chest of 03/23/2016

CLINICAL DATA: History of left lung carcinoma with left lower lobe
anterior basilar segmentectomy in 4038, and now with shortness of
breath over the last 2 days and right-sided chest pain

EXAM:
CT ANGIOGRAPHY CHEST WITH CONTRAST
TECHNIQUE: Multidetector CT imaging of the chest was performed using the
standard protocol during bolus administration of intravenous
contrast. Multiplanar CT image reconstructions and MIPs were
obtained to evaluate the vascular anatomy.
CONTRAST:  100 cc Isovue 370

[Series 6: pe thins · axial · 0.59mm/px · z∈[+1074,+1317]mm · 17 of 275 slices shown]
[im 16/275  lung]
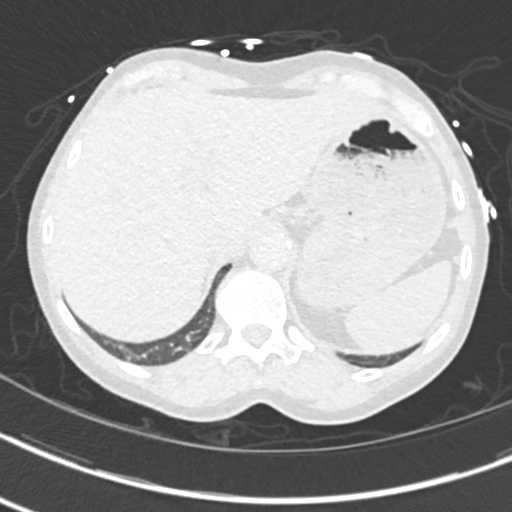
[im 31/275  mediastinal]
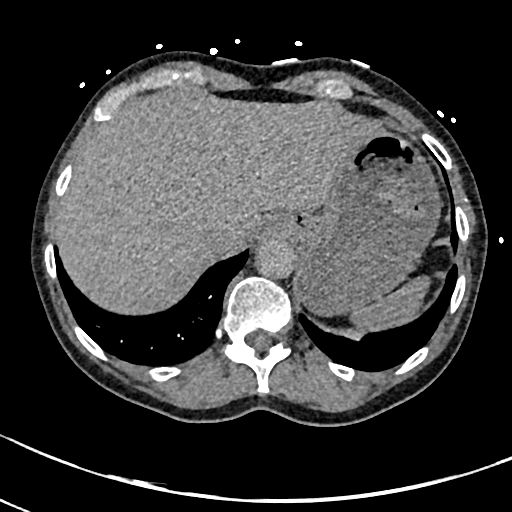
[im 46/275  lung]
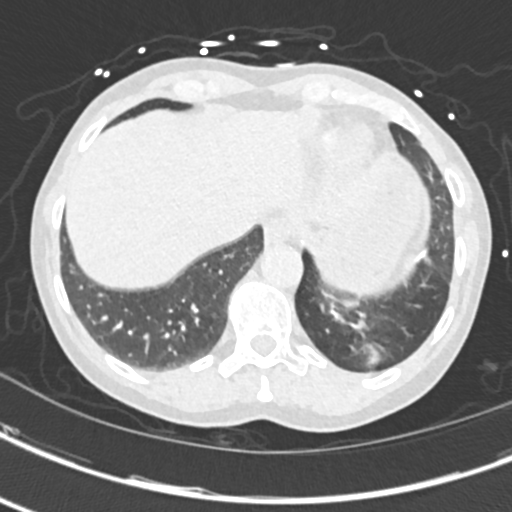
[im 61/275  mediastinal]
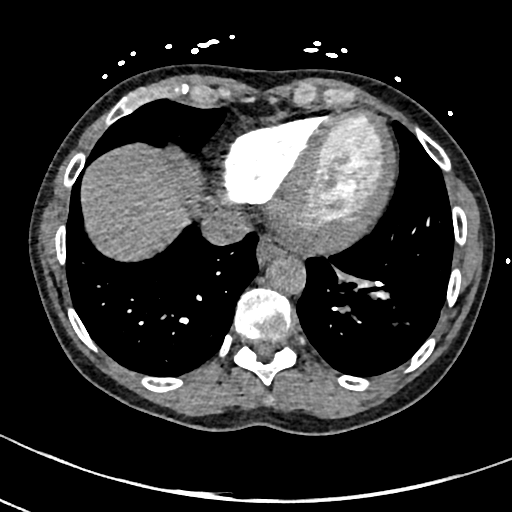
[im 77/275  lung]
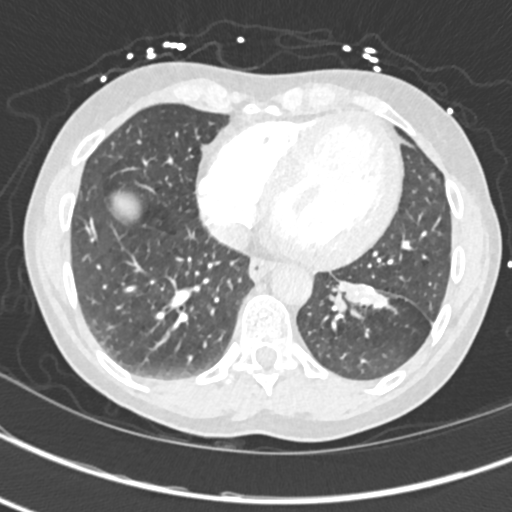
[im 92/275  mediastinal]
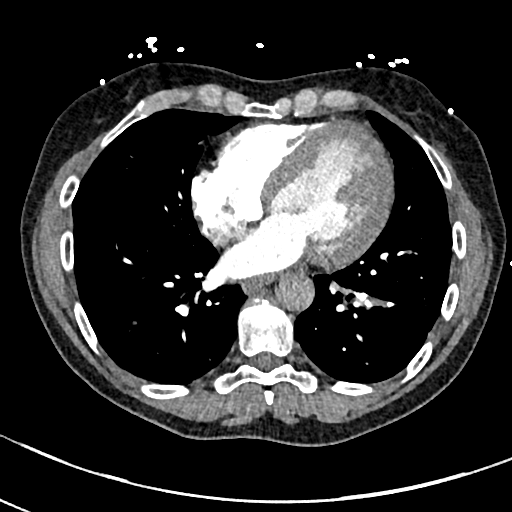
[im 107/275  lung]
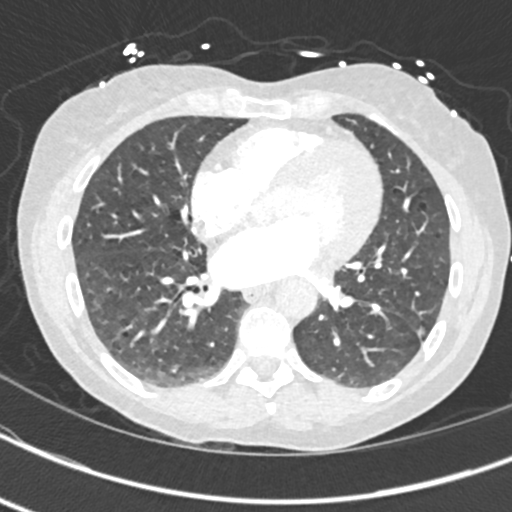
[im 122/275  mediastinal]
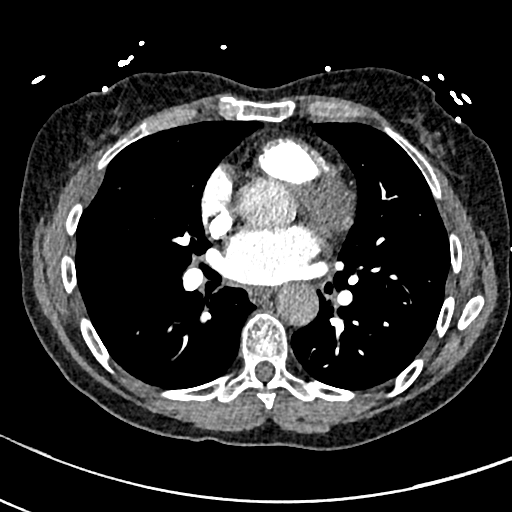
[im 138/275  lung]
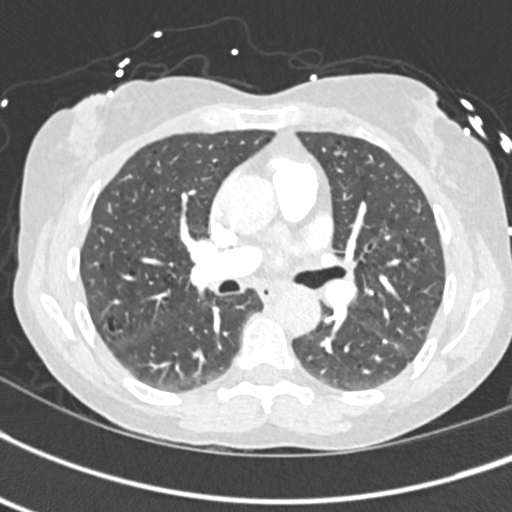
[im 153/275  mediastinal]
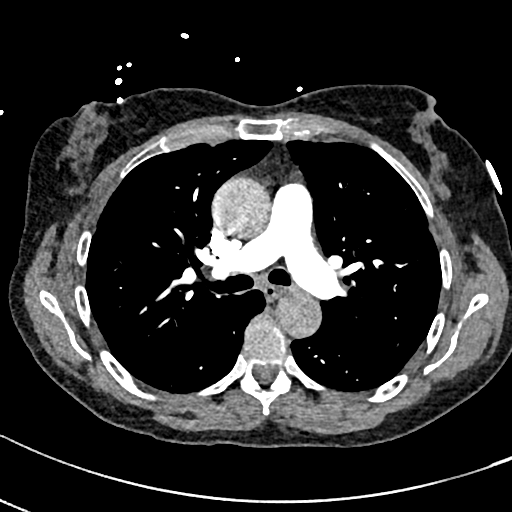
[im 168/275  lung]
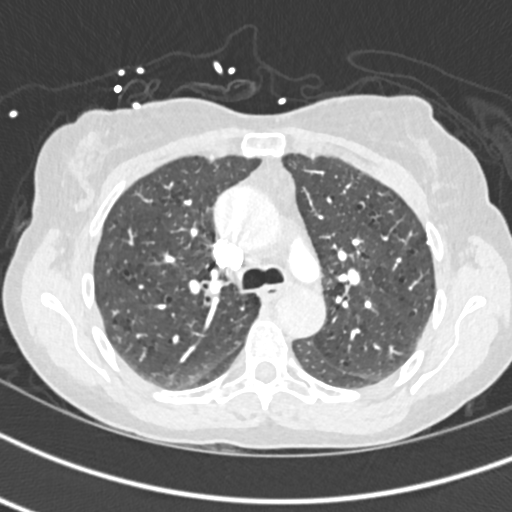
[im 183/275  mediastinal]
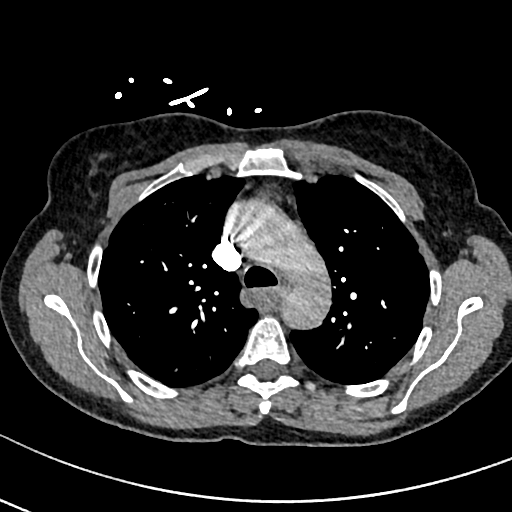
[im 198/275  lung]
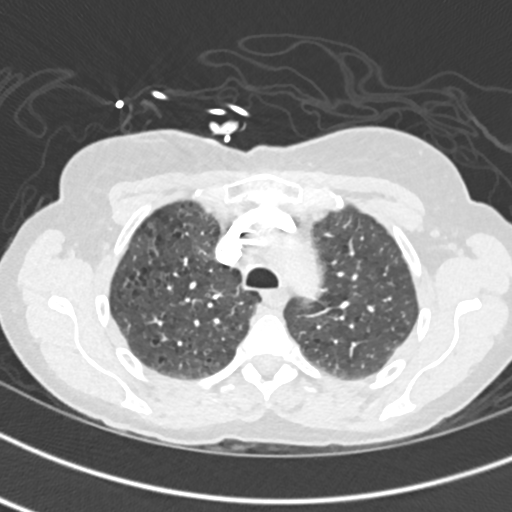
[im 214/275  mediastinal]
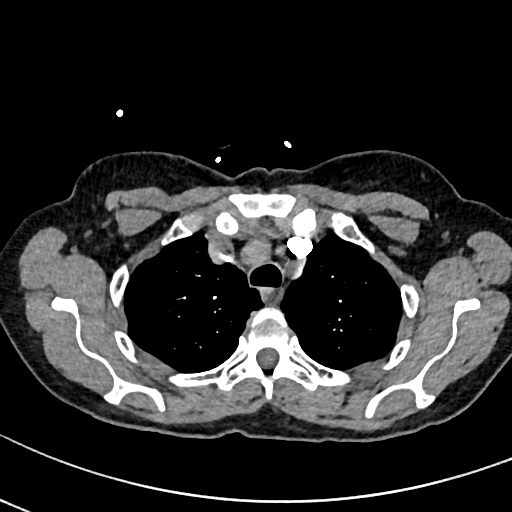
[im 229/275  lung]
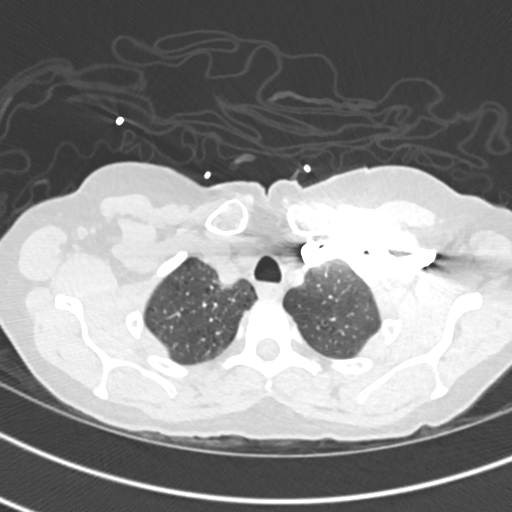
[im 244/275  mediastinal]
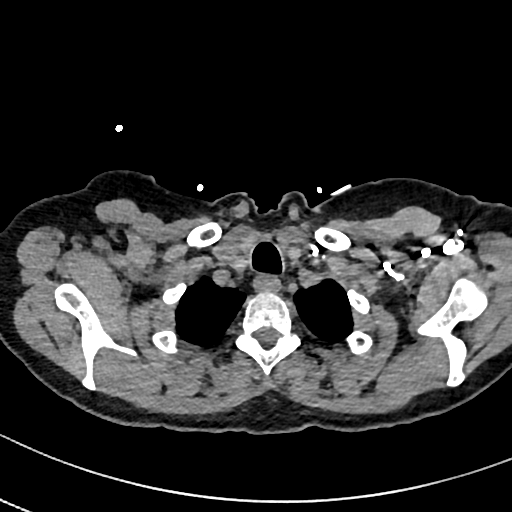
[im 259/275  lung]
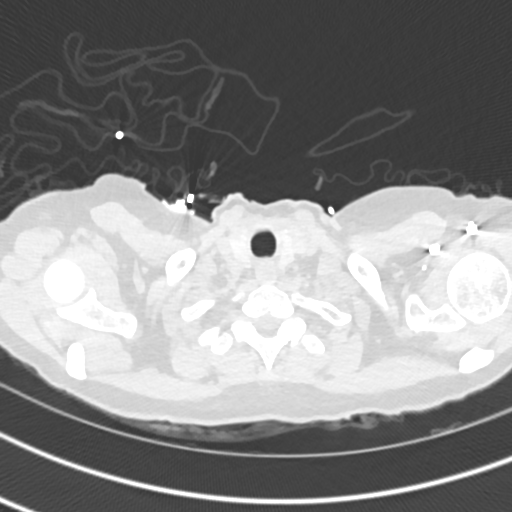

[Series 10: pe 2mm cor · coronal · 0.59mm/px · 1 of 148 slices shown]
[im 74/148  mediastinal]
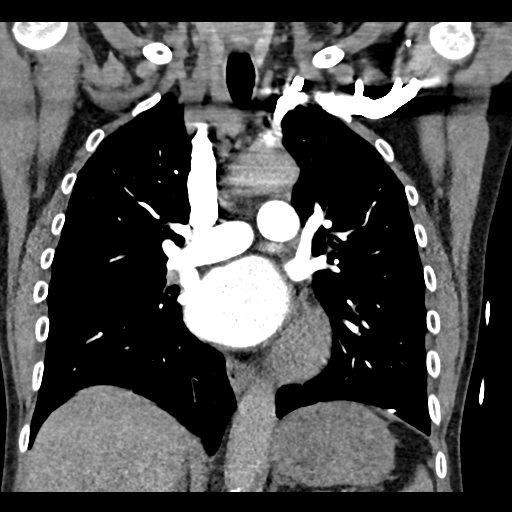

[18 of 36 positions shown; findings below may reference images not displayed]

FINDINGS: Cardiovascular: The pulmonary arteries are well opacified. There is
no evidence of acute pulmonary embolism. The thoracic aorta is not
well opacified but up no obvious acute abnormality is seen. The mid
ascending thoracic aorta measures 33 mm in diameter. There is mild
cardiomegaly present. No pericardial effusion is seen. No
significant coronary artery calcifications are noted.

Mediastinum/Nodes: No mediastinal or hilar adenopathy is seen. The
thyroid gland is unremarkable of although there may be a small
low-attenuation nodule anteriorly in the right lobe and ultrasound
could be performed if warranted.

Lungs/Pleura: Changes of centrilobular emphysema are present. A 4 mm
noncalcified nodule is present on image 95 series 5 which appears to
have been present previously and is unchanged. A small bleb is noted
in the right lower lobe on image 77 series 5 which may contain a
small amount of fluid possibly minimally impacted. Surgical sutures
are present from left lower lobe anterior basilar segmentectomy.
There is slightly more soft tissue opacity associated with the area
of surgery in the left lower lobe anteriorly abutting the major
fissure than on the prior CT. Some of this opacity could be due to
mucous inspissation, but recurrence of carcinoma cannot be excluded.
PET-CT may be helpful to assess metabolic activity. The central
airway is patent. No pleural effusion is seen

Upper Abdomen: No abnormality is seen on the limited images through
the upper abdomen.

Musculoskeletal: The thoracic vertebrae are in normal alignment. No
compression deformity is seen.

Review of the MIP images confirms the above findings.
IMPRESSION: 1. There is perhaps slightly more soft tissue at the site of prior
surgery in the left lower lobe anteriorly. Some of this could be due
to progressive scarring, as well as inspissated mucus, but
recurrence of carcinoma cannot be excluded. Consider PET-CT to
assess metabolic activity.
2. Changes of centrilobular emphysema.
3. No evidence of acute pulmonary embolism.
4. A small bleb in the right lower lobe with a tiny amount of fluid
may represent an infected bleb.

## 2019-02-21 IMAGING — CT CT CHEST W/ CM
2 of 4 series · 15 of 36 positions shown, 18 images · IV contrast (ISOVUE 300)
Comparison: 06/02/2016 chest CT angiogram.

CLINICAL DATA: Stage IA left lower lobe primary bronchogenic
adenocarcinoma status post wedge resection 02/13/2015, presenting
for restaging on observation.

EXAM:
CT CHEST WITH CONTRAST
TECHNIQUE: Multidetector CT imaging of the chest was performed during
intravenous contrast administration.
CONTRAST:  < 75 cc > H93BMU-HMM IOPAMIDOL (H93BMU-HMM) INJECTION 61%

[Series 2: axial st · axial · 0.62mm/px · z∈[-282,+10]mm · 12 of 174 slices shown, 15 images]
[im 14/174  mediastinal]
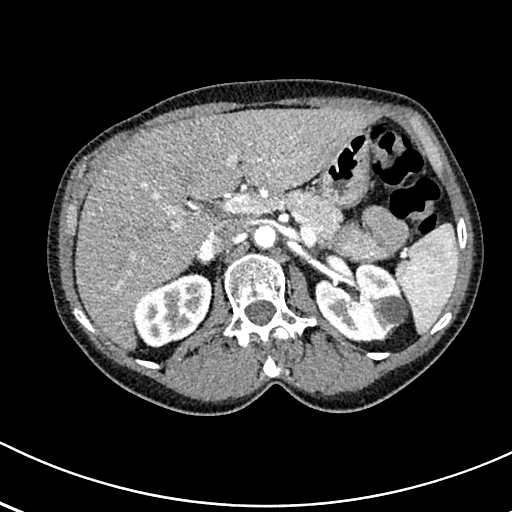
[im 14/174  lung]
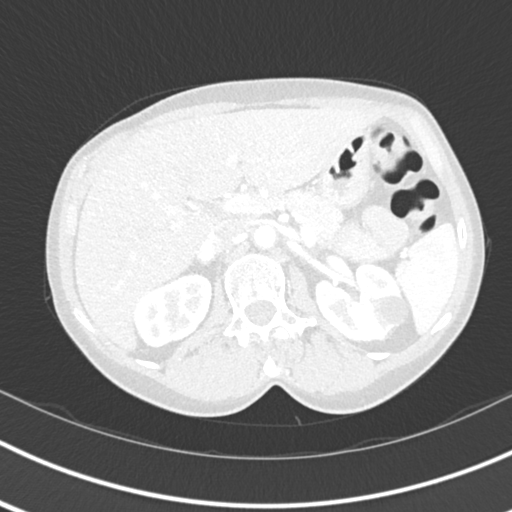
[im 27/174  lung]
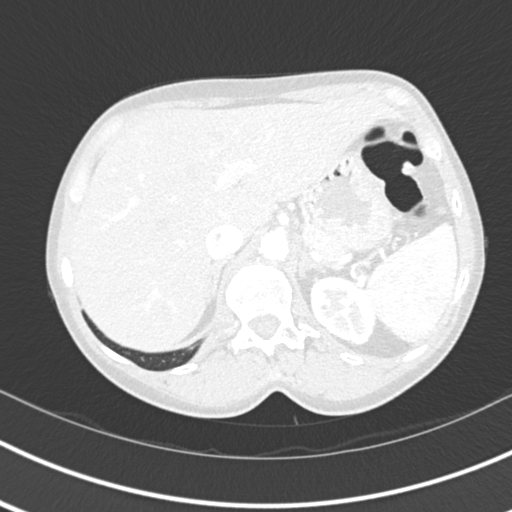
[im 40/174  lung]
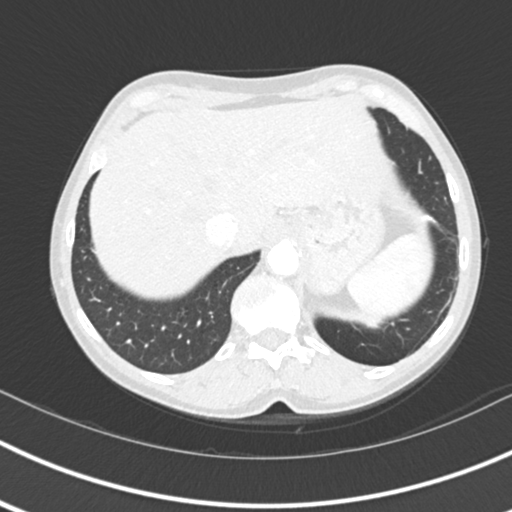
[im 54/174  lung]
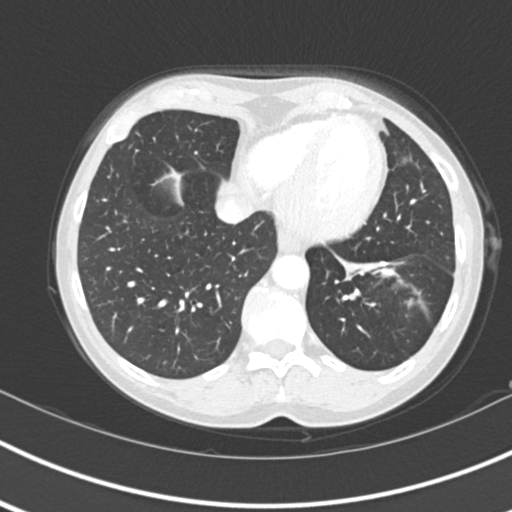
[im 67/174  mediastinal]
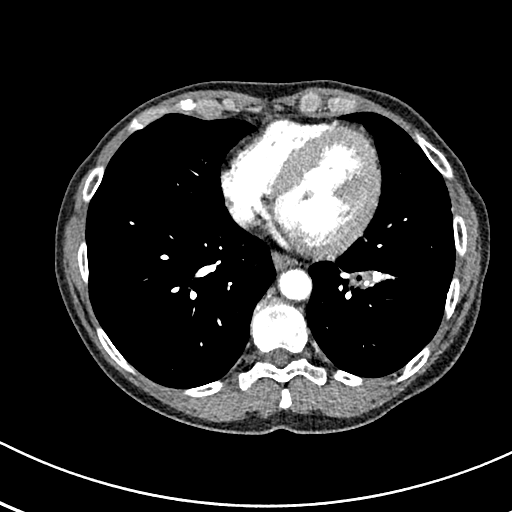
[im 67/174  lung]
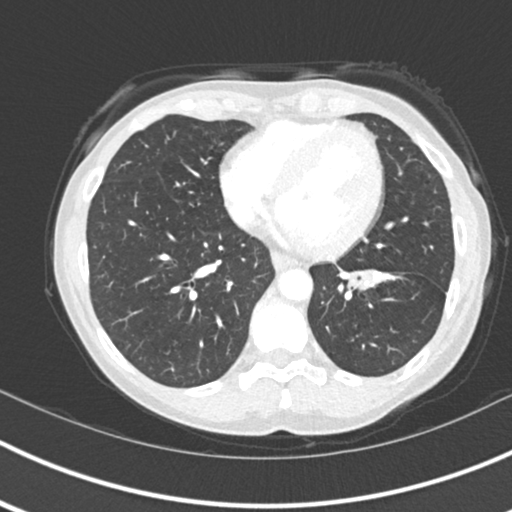
[im 80/174  lung]
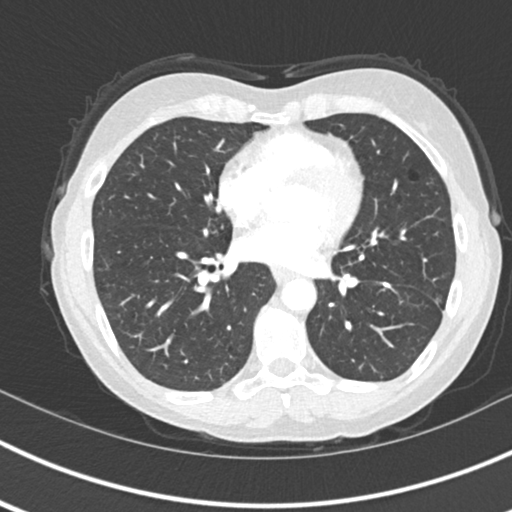
[im 94/174  lung]
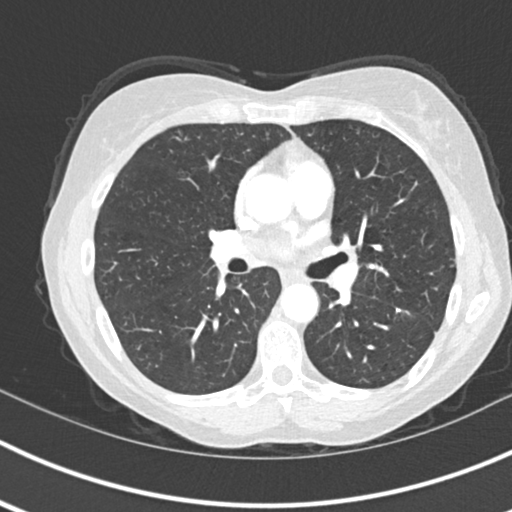
[im 107/174  lung]
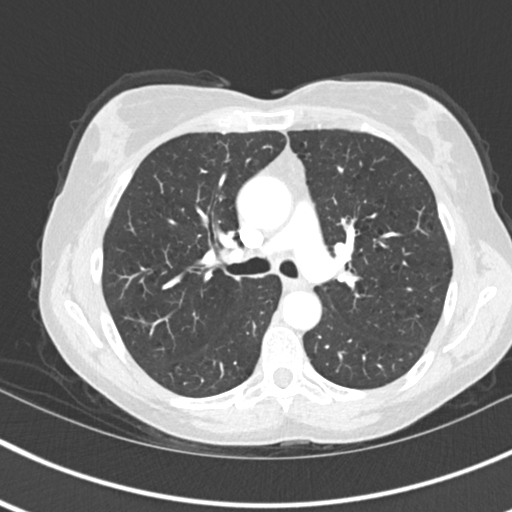
[im 120/174  mediastinal]
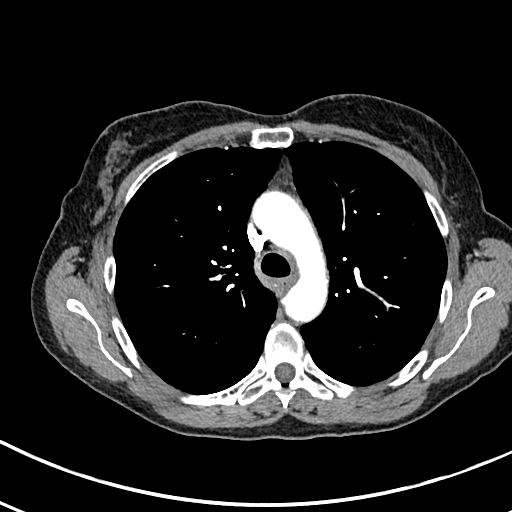
[im 120/174  lung]
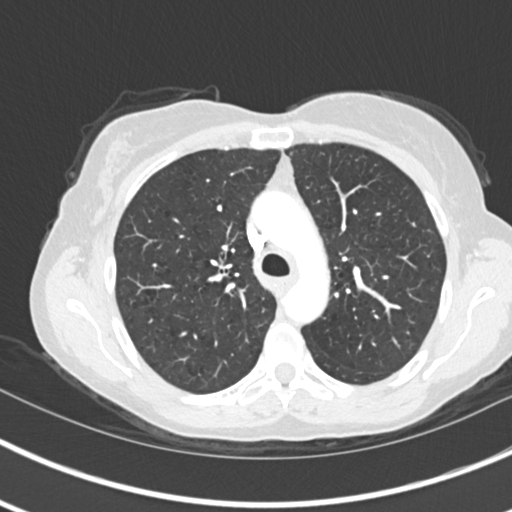
[im 134/174  lung]
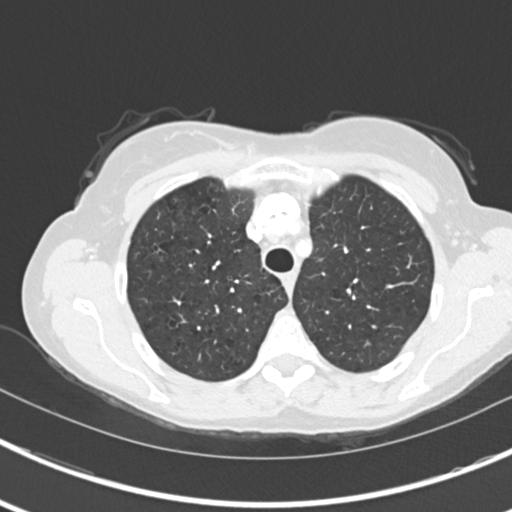
[im 147/174  lung]
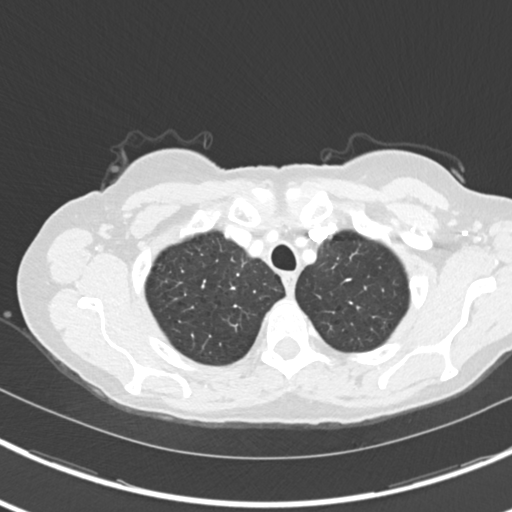
[im 160/174  lung]
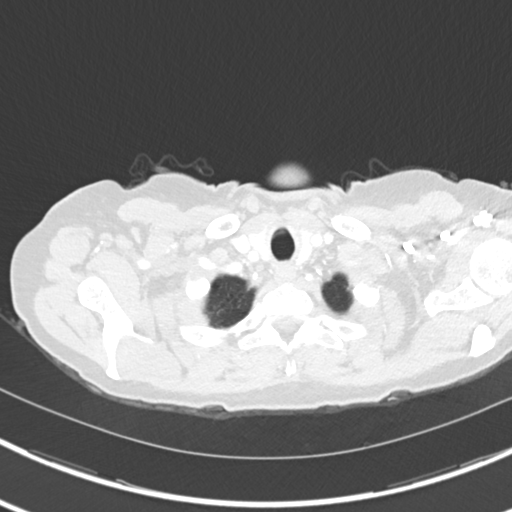

[Series 6: coronal · coronal · 0.66mm/px · 3 of 132 slices shown]
[im 27/132  lung]
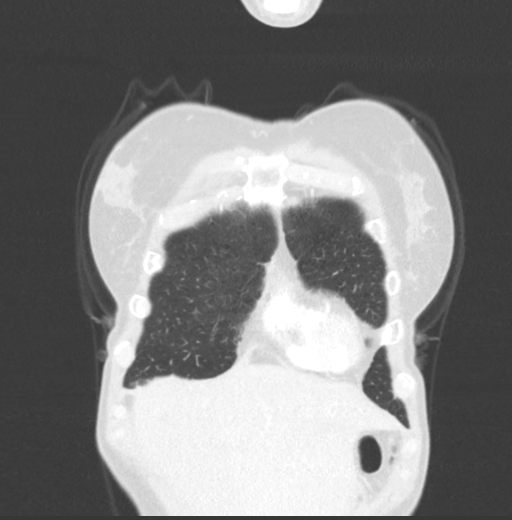
[im 53/132  lung]
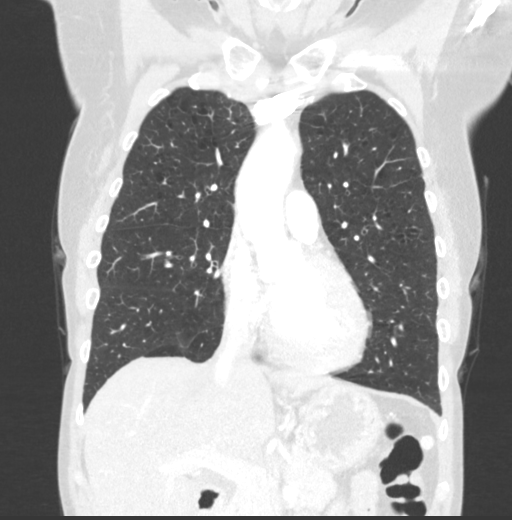
[im 79/132  lung]
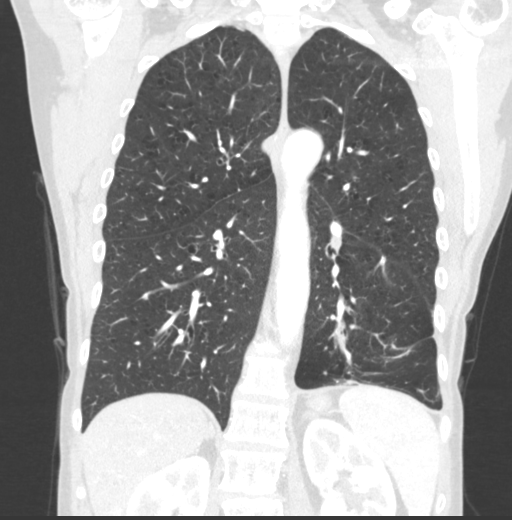

[15 of 36 positions shown; findings below may reference images not displayed]

FINDINGS: Cardiovascular: Normal heart size. No significant pericardial
fluid/thickening. Atherosclerotic nonaneurysmal thoracic aorta.
Normal caliber pulmonary arteries. No central pulmonary emboli.

Mediastinum/Nodes: No discrete thyroid nodules. Unremarkable
esophagus. No pathologically enlarged axillary, mediastinal or hilar
lymph nodes.

Lungs/Pleura: No pneumothorax. No pleural effusion. Mild-to-moderate
centrilobular emphysema with mild diffuse bronchial wall thickening.
Stable 4 mm right lower lobe pulmonary nodule (series 5/ image 105),
not appreciably changed back to 11/20/2014, considered benign.
Status post anterior left lower lobe wedge resection. Bandlike soft
tissue at the wedge resection site measures up to 9 mm thickness
(series 5/ image 112), previously 10 mm on 06/02/2016 and
03/23/2016, not appreciably changed, most compatible with
postsurgical scarring. No acute consolidative airspace disease or
new significant pulmonary nodules.

Upper abdomen: Simple 1.6 cm lateral upper left renal cyst.

Musculoskeletal: No aggressive appearing focal osseous lesions. Mild
thoracic spondylosis.
IMPRESSION: 1. No findings suspicious for local tumor recurrence at the anterior
left lower lobe wedge resection site, with stable postsurgical
scarring in this location.
2. No evidence of metastatic disease in the chest .

Aortic Atherosclerosis (DJE40-DLN.N) and Emphysema ([id number].F).

## 2019-03-04 ENCOUNTER — Emergency Department (HOSPITAL_COMMUNITY): Payer: Medicaid Other

## 2019-03-04 ENCOUNTER — Encounter (HOSPITAL_COMMUNITY): Payer: Self-pay

## 2019-03-04 ENCOUNTER — Emergency Department (HOSPITAL_COMMUNITY)
Admission: EM | Admit: 2019-03-04 | Discharge: 2019-03-04 | Disposition: A | Discharge status: Home / Self Care | Payer: Medicaid Other | Attending: Emergency Medicine | Admitting: Emergency Medicine

## 2019-03-04 DIAGNOSIS — Z20828 Contact with and (suspected) exposure to other viral communicable diseases: Secondary | ICD-10-CM | POA: Diagnosis not present

## 2019-03-04 DIAGNOSIS — I2510 Atherosclerotic heart disease of native coronary artery without angina pectoris: Secondary | ICD-10-CM | POA: Insufficient documentation

## 2019-03-04 DIAGNOSIS — I509 Heart failure, unspecified: Secondary | ICD-10-CM | POA: Diagnosis not present

## 2019-03-04 DIAGNOSIS — R109 Unspecified abdominal pain: Secondary | ICD-10-CM | POA: Diagnosis not present

## 2019-03-04 DIAGNOSIS — F17210 Nicotine dependence, cigarettes, uncomplicated: Secondary | ICD-10-CM | POA: Insufficient documentation

## 2019-03-04 DIAGNOSIS — M791 Myalgia, unspecified site: Secondary | ICD-10-CM | POA: Diagnosis present

## 2019-03-04 DIAGNOSIS — Z79899 Other long term (current) drug therapy: Secondary | ICD-10-CM | POA: Diagnosis not present

## 2019-03-04 DIAGNOSIS — Z8673 Personal history of transient ischemic attack (TIA), and cerebral infarction without residual deficits: Secondary | ICD-10-CM | POA: Diagnosis not present

## 2019-03-04 DIAGNOSIS — Z7982 Long term (current) use of aspirin: Secondary | ICD-10-CM | POA: Insufficient documentation

## 2019-03-04 DIAGNOSIS — I110 Hypertensive heart disease with heart failure: Secondary | ICD-10-CM | POA: Diagnosis not present

## 2019-03-04 LAB — TROPONIN I (HIGH SENSITIVITY)
Troponin I (High Sensitivity): 6 ng/L (ref <18)
Troponin I (High Sensitivity): 4 ng/L (ref <18)

## 2019-03-04 LAB — COMPREHENSIVE METABOLIC PANEL
ALT: 13 U/L (ref 0–44)
AST: 17 U/L (ref 15–41)
Albumin: 3.9 g/dL (ref 3.5–5.0)
Alkaline Phosphatase: 82 U/L (ref 38–126)
Anion gap: 9 (ref 5–15)
BUN: 17 mg/dL (ref 6–20)
CO2: 23 mmol/L (ref 22–32)
Calcium: 9.3 mg/dL (ref 8.9–10.3)
Chloride: 107 mmol/L (ref 98–111)
Creatinine, Ser: 0.8 mg/dL (ref 0.44–1.00)
GFR calc Af Amer: >60 mL/min (ref >60)
GFR calc non Af Amer: >60 mL/min (ref >60)
Glucose, Bld: 96 mg/dL (ref 70–99)
Potassium: 4.2 mmol/L (ref 3.5–5.1)
Sodium: 139 mmol/L (ref 135–145)
Total Bilirubin: 0.4 mg/dL (ref 0.3–1.2)
Total Protein: 8.1 g/dL (ref 6.5–8.1)

## 2019-03-04 LAB — CBC WITH DIFFERENTIAL/PLATELET
Abs Immature Granulocytes: 0.03 10*3/uL (ref 0.00–0.07)
Basophils Absolute: 0 10*3/uL (ref 0.0–0.1)
Basophils Relative: 0 %
Eosinophils Absolute: 0.3 10*3/uL (ref 0.0–0.5)
Eosinophils Relative: 3 %
HCT: 45.1 % (ref 36.0–46.0)
Hemoglobin: 14.3 g/dL (ref 12.0–15.0)
Immature Granulocytes: 0 %
Lymphocytes Relative: 34 %
Lymphs Abs: 3.3 10*3/uL (ref 0.7–4.0)
MCH: 30.2 pg (ref 26.0–34.0)
MCHC: 31.7 g/dL (ref 30.0–36.0)
MCV: 95.3 fL (ref 80.0–100.0)
Monocytes Absolute: 0.6 10*3/uL (ref 0.1–1.0)
Monocytes Relative: 6 %
Neutro Abs: 5.5 10*3/uL (ref 1.7–7.7)
Neutrophils Relative %: 57 %
Platelets: 259 10*3/uL (ref 150–400)
RBC: 4.73 MIL/uL (ref 3.87–5.11)
RDW: 13.5 % (ref 11.5–15.5)
WBC: 9.7 10*3/uL (ref 4.0–10.5)
nRBC: 0 % (ref 0.0–0.2)

## 2019-03-04 LAB — URINALYSIS, ROUTINE W REFLEX MICROSCOPIC
Bilirubin Urine: NEGATIVE
Glucose, UA: NEGATIVE mg/dL
Ketones, ur: NEGATIVE mg/dL
Leukocytes,Ua: NEGATIVE
Nitrite: NEGATIVE
Protein, ur: NEGATIVE mg/dL
Specific Gravity, Urine: 1.005 (ref 1.005–1.030)
pH: 6 (ref 5.0–8.0)

## 2019-03-04 LAB — RAPID URINE DRUG SCREEN, HOSP PERFORMED
Amphetamines: NOT DETECTED
Barbiturates: NOT DETECTED
Benzodiazepines: POSITIVE — AB
Cocaine: POSITIVE — AB
Opiates: NOT DETECTED
Tetrahydrocannabinol: NOT DETECTED

## 2019-03-04 LAB — I-STAT CHEM 8, ED
BUN: 15 mg/dL (ref 6–20)
Calcium, Ion: 1.12 mmol/L — ABNORMAL LOW (ref 1.15–1.40)
Chloride: 107 mmol/L (ref 98–111)
Creatinine, Ser: 0.7 mg/dL (ref 0.44–1.00)
Glucose, Bld: 92 mg/dL (ref 70–99)
HCT: 43 % (ref 36.0–46.0)
Hemoglobin: 14.6 g/dL (ref 12.0–15.0)
Potassium: 4.3 mmol/L (ref 3.5–5.1)
Sodium: 140 mmol/L (ref 135–145)
TCO2: 23 mmol/L (ref 22–32)

## 2019-03-04 LAB — CBG MONITORING, ED: Glucose-Capillary: 119 mg/dL — ABNORMAL HIGH (ref 70–99)

## 2019-03-04 LAB — POC SARS CORONAVIRUS 2 AG -  ED: SARS Coronavirus 2 Ag: NEGATIVE

## 2019-03-04 LAB — CK: Total CK: 51 U/L (ref 38–234)

## 2019-03-04 LAB — ETHANOL: Alcohol, Ethyl (B): <10 mg/dL (ref <10)

## 2019-03-04 MED ORDER — SODIUM CHLORIDE 0.9 % IV BOLUS
1000.0000 mL | Freq: Once | INTRAVENOUS | Status: AC
  Administered 2019-03-04: 15:00:00 1000 mL via INTRAVENOUS

## 2019-03-04 MED ORDER — ACETAMINOPHEN 325 MG PO TABS
650.0000 mg | ORAL_TABLET | Freq: Once | ORAL | Status: AC | PRN
  Administered 2019-03-04: 650 mg via ORAL
  Filled 2019-03-04: qty 2

## 2019-03-04 MED ORDER — SODIUM CHLORIDE (PF) 0.9 % IJ SOLN
INTRAMUSCULAR | Status: AC
  Filled 2019-03-04: qty 50

## 2019-03-04 MED ORDER — IOHEXOL 300 MG/ML  SOLN
100.0000 mL | Freq: Once | INTRAMUSCULAR | Status: AC | PRN
  Administered 2019-03-04: 80 mL via INTRAVENOUS

## 2019-03-04 MED ORDER — KETOROLAC TROMETHAMINE 15 MG/ML IJ SOLN
15.0000 mg | Freq: Once | INTRAMUSCULAR | Status: AC
  Administered 2019-03-04: 15 mg via INTRAVENOUS
  Filled 2019-03-04: qty 1
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
## 2019-03-04 NOTE — ED Notes (Signed)
CBG 119. RN aware.

## 2019-03-04 NOTE — Discharge Instructions (Signed)
If you develop worsening, continued, or recurrent abdominal pain, uncontrolled vomiting, fever, chest or back pain, or any other new/concerning symptoms then return to the ER for evaluation.  

## 2019-03-04 NOTE — ED Triage Notes (Signed)
Pt states that she has had body aches x 4 days and nausea/vomiting x 1 day. States she was coughing last week but is a current smoker. No fevers reported. Hx of lung CA. 99 RA.

## 2019-03-06 ENCOUNTER — Encounter (HOSPITAL_COMMUNITY): Payer: Self-pay | Admitting: Emergency Medicine

## 2019-03-06 ENCOUNTER — Other Ambulatory Visit: Payer: Self-pay

## 2019-03-06 ENCOUNTER — Emergency Department (HOSPITAL_COMMUNITY)
Admission: EM | Admit: 2019-03-06 | Discharge: 2019-03-06 | Disposition: A | Discharge status: Home / Self Care | Payer: Medicaid Other | Attending: Emergency Medicine | Admitting: Emergency Medicine

## 2019-03-06 ENCOUNTER — Emergency Department (HOSPITAL_COMMUNITY): Payer: Medicaid Other

## 2019-03-06 DIAGNOSIS — M7918 Myalgia, other site: Secondary | ICD-10-CM | POA: Diagnosis present

## 2019-03-06 DIAGNOSIS — M79606 Pain in leg, unspecified: Secondary | ICD-10-CM | POA: Diagnosis not present

## 2019-03-06 DIAGNOSIS — M791 Myalgia, unspecified site: Secondary | ICD-10-CM

## 2019-03-06 DIAGNOSIS — F17210 Nicotine dependence, cigarettes, uncomplicated: Secondary | ICD-10-CM | POA: Diagnosis not present

## 2019-03-06 DIAGNOSIS — I10 Essential (primary) hypertension: Secondary | ICD-10-CM | POA: Diagnosis not present

## 2019-03-06 DIAGNOSIS — F319 Bipolar disorder, unspecified: Secondary | ICD-10-CM | POA: Diagnosis not present

## 2019-03-06 DIAGNOSIS — Z7982 Long term (current) use of aspirin: Secondary | ICD-10-CM | POA: Diagnosis not present

## 2019-03-06 DIAGNOSIS — Z85118 Personal history of other malignant neoplasm of bronchus and lung: Secondary | ICD-10-CM | POA: Diagnosis not present

## 2019-03-06 LAB — NOVEL CORONAVIRUS, NAA (HOSP ORDER, SEND-OUT TO REF LAB; TAT 18-24 HRS): SARS-CoV-2, NAA: NOT DETECTED

## 2019-03-06 MED ORDER — KETOROLAC TROMETHAMINE 60 MG/2ML IM SOLN
30.0000 mg | Freq: Once | INTRAMUSCULAR | Status: AC
  Administered 2019-03-06: 30 mg via INTRAMUSCULAR
  Filled 2019-03-06: qty 2

## 2019-03-06 NOTE — ED Triage Notes (Signed)
Patient arrived with EMS from home reports persistent generalized muscle aches onset last week , denies injury/ambulatory , seen at [facility] yesterday for the same complaints.
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
## 2019-03-06 NOTE — ED Notes (Signed)
Patient c/o generalized pain; states has lung cancer and has not been to see a phys in 10 months because she is scared of contracting Covid. Pt is in tears and nearly inconsolable. Pt told tech "I'm just going to kill myself if you don't take me seriously". Patient advised that we take complaints of SI seriously, and that this may lead to IVC - pt denies SI. Patient states needs something for pain "now". Patient advised that she has multiple patients in front of her, and while I sympathize that she has pain, there is a 7 hour wait to be seen and we are working on getting everyone back as quickly as possible.

## 2019-03-06 NOTE — Discharge Instructions (Addendum)
Case management will contact you to assist with coordinating PCP follow up.

## 2019-03-06 NOTE — ED Notes (Signed)
Pt highly upset and shaken while in the waiting room. Pt was also heard by multiple patients saying that if not helped right away, that she wanted to "kill herself." pt was also seen pacing around the waiting room and outside pushing a wheelchair curing and yelling out loud how upset she was by being here in the ED. Triage Nurse spoke to pt directly and calmed and reassured pt.

## 2019-03-06 NOTE — ED Notes (Signed)
Pt walked out of ED with out waiting for DC papers

## 2019-03-07 ENCOUNTER — Other Ambulatory Visit: Payer: Self-pay

## 2019-03-07 ENCOUNTER — Emergency Department (HOSPITAL_COMMUNITY)
Admission: EM | Admit: 2019-03-07 | Discharge: 2019-03-07 | Disposition: A | Discharge status: Home / Self Care | Payer: Medicaid Other | Attending: Emergency Medicine | Admitting: Emergency Medicine

## 2019-03-07 DIAGNOSIS — Z8673 Personal history of transient ischemic attack (TIA), and cerebral infarction without residual deficits: Secondary | ICD-10-CM | POA: Diagnosis not present

## 2019-03-07 DIAGNOSIS — Z79899 Other long term (current) drug therapy: Secondary | ICD-10-CM | POA: Insufficient documentation

## 2019-03-07 DIAGNOSIS — I509 Heart failure, unspecified: Secondary | ICD-10-CM | POA: Diagnosis not present

## 2019-03-07 DIAGNOSIS — I110 Hypertensive heart disease with heart failure: Secondary | ICD-10-CM | POA: Insufficient documentation

## 2019-03-07 DIAGNOSIS — Z85118 Personal history of other malignant neoplasm of bronchus and lung: Secondary | ICD-10-CM | POA: Insufficient documentation

## 2019-03-07 DIAGNOSIS — M791 Myalgia, unspecified site: Secondary | ICD-10-CM | POA: Insufficient documentation

## 2019-03-07 DIAGNOSIS — Z20828 Contact with and (suspected) exposure to other viral communicable diseases: Secondary | ICD-10-CM | POA: Diagnosis not present

## 2019-03-07 DIAGNOSIS — I259 Chronic ischemic heart disease, unspecified: Secondary | ICD-10-CM | POA: Insufficient documentation

## 2019-03-07 DIAGNOSIS — Z7982 Long term (current) use of aspirin: Secondary | ICD-10-CM | POA: Insufficient documentation

## 2019-03-07 DIAGNOSIS — F17210 Nicotine dependence, cigarettes, uncomplicated: Secondary | ICD-10-CM | POA: Insufficient documentation

## 2019-03-07 LAB — POC SARS CORONAVIRUS 2 AG -  ED: SARS Coronavirus 2 Ag: NEGATIVE

## 2019-03-07 MED ORDER — KETOROLAC TROMETHAMINE 60 MG/2ML IM SOLN
60.0000 mg | Freq: Once | INTRAMUSCULAR | Status: AC
  Administered 2019-03-07: 60 mg via INTRAMUSCULAR
  Filled 2019-03-07: qty 2

## 2019-03-07 NOTE — ED Notes (Signed)
Pt was verbalized discharge instructions. Pt had no further questions at this time. NAD. 

## 2019-03-07 NOTE — ED Notes (Signed)
Patient ambulated in room. Gait unsteady. SPO2 remained 99% RA. Pt states it took her two hours to walk from [facility] ED to the bus stop. Pt denies using her walker that is at her bedside. Will continue to monitor.
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
## 2019-03-07 NOTE — Discharge Instructions (Addendum)
1. Medications: alternate ibuprofen and tylenol as needed for pain; usual home medications 2. Treatment: rest, drink plenty of fluids,  3. Follow Up: Please followup with your primary doctor in 2-3 days for discussion of your diagnoses and further evaluation after today's visit; if you do not have a primary care doctor use the resource guide provided to find one; Please return to the ER for difficulty breathing, focal weakness, persistent vomiting, high fevers or other concerns.

## 2019-03-07 NOTE — ED Triage Notes (Signed)
Per EMS: Pt is coming from home with a c/o muscle weakness. Patient states that she has been around people diagnosed with covid. She was covid tested on Saturday with a negative result. Pt reports having a nagging cough and has trouble sleeping at night. Denies N/V.  EMS VITALS: BP 160/60 HR 86 SPO2 100% TEMP 98.1

## 2019-03-29 ENCOUNTER — Other Ambulatory Visit: Payer: Self-pay

## 2019-03-29 ENCOUNTER — Emergency Department (HOSPITAL_COMMUNITY)
Admission: EM | Admit: 2019-03-29 | Discharge: 2019-03-29 | Disposition: A | Discharge status: Home / Self Care | Payer: Medicaid Other | Attending: Emergency Medicine | Admitting: Emergency Medicine

## 2019-03-29 ENCOUNTER — Emergency Department (HOSPITAL_COMMUNITY): Payer: Medicaid Other

## 2019-03-29 ENCOUNTER — Encounter (HOSPITAL_COMMUNITY): Payer: Self-pay | Admitting: Emergency Medicine

## 2019-03-29 DIAGNOSIS — Y999 Unspecified external cause status: Secondary | ICD-10-CM | POA: Diagnosis not present

## 2019-03-29 DIAGNOSIS — Z7982 Long term (current) use of aspirin: Secondary | ICD-10-CM | POA: Diagnosis not present

## 2019-03-29 DIAGNOSIS — Y9301 Activity, walking, marching and hiking: Secondary | ICD-10-CM | POA: Diagnosis not present

## 2019-03-29 DIAGNOSIS — Y929 Unspecified place or not applicable: Secondary | ICD-10-CM | POA: Diagnosis not present

## 2019-03-29 DIAGNOSIS — I2510 Atherosclerotic heart disease of native coronary artery without angina pectoris: Secondary | ICD-10-CM | POA: Diagnosis not present

## 2019-03-29 DIAGNOSIS — I110 Hypertensive heart disease with heart failure: Secondary | ICD-10-CM | POA: Insufficient documentation

## 2019-03-29 DIAGNOSIS — Z79899 Other long term (current) drug therapy: Secondary | ICD-10-CM | POA: Diagnosis not present

## 2019-03-29 DIAGNOSIS — F17210 Nicotine dependence, cigarettes, uncomplicated: Secondary | ICD-10-CM | POA: Insufficient documentation

## 2019-03-29 DIAGNOSIS — W109XXA Fall (on) (from) unspecified stairs and steps, initial encounter: Secondary | ICD-10-CM | POA: Insufficient documentation

## 2019-03-29 DIAGNOSIS — I509 Heart failure, unspecified: Secondary | ICD-10-CM | POA: Insufficient documentation

## 2019-03-29 DIAGNOSIS — S7001XA Contusion of right hip, initial encounter: Secondary | ICD-10-CM | POA: Insufficient documentation

## 2019-03-29 DIAGNOSIS — Z951 Presence of aortocoronary bypass graft: Secondary | ICD-10-CM | POA: Insufficient documentation

## 2019-03-29 DIAGNOSIS — S70911A Unspecified superficial injury of right hip, initial encounter: Secondary | ICD-10-CM | POA: Diagnosis present

## 2019-03-29 MED ORDER — LORAZEPAM 2 MG/ML IJ SOLN
1.0000 mg | Freq: Once | INTRAMUSCULAR | Status: AC
  Administered 2019-03-29: 1 mg via INTRAVENOUS
  Filled 2019-03-29: qty 1

## 2019-03-29 MED ORDER — KETOROLAC TROMETHAMINE 15 MG/ML IJ SOLN
15.0000 mg | Freq: Once | INTRAMUSCULAR | Status: AC
  Administered 2019-03-29: 15 mg via INTRAVENOUS
  Filled 2019-03-29: qty 1

## 2019-03-29 NOTE — Discharge Instructions (Addendum)
Your x-rays are ok. There is some arthritis in your hips but not signs of fracture/dislocation. Your hip will likely be sore for several days. You can continue activity as tolerated.
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
## 2019-03-29 NOTE — ED Triage Notes (Signed)
Pt BIB EMS.  Pt was going down the stairs and missed 3 stairs and slid down the stairs on her right hip.  Pt was able to get to her apartment "but it took her a long time".  No shortening or rotation.  Pt was given 100 mcg fentanyl en route.  Upon moving to the bed, pt complains of a chronic abd hernia.  Pt able to move quite well. Pt undressed herself and lifted her hips off the bed with no assistance or perceived limitations due to pain.  Pt c/o pain 8/10.  Faces scale 0/10.

## 2019-07-24 ENCOUNTER — Other Ambulatory Visit: Payer: Self-pay | Admitting: Medical Oncology

## 2019-07-24 DIAGNOSIS — C3490 Malignant neoplasm of unspecified part of unspecified bronchus or lung: Secondary | ICD-10-CM
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
## 2019-08-04 ENCOUNTER — Telehealth: Payer: Self-pay | Admitting: Internal Medicine

## 2019-08-04 NOTE — Telephone Encounter (Signed)
Scheduled appt per 4/23 sch message - pt is aware of appt date and times

## 2019-08-07 ENCOUNTER — Encounter (HOSPITAL_COMMUNITY): Payer: Self-pay

## 2019-08-07 ENCOUNTER — Other Ambulatory Visit: Payer: Self-pay

## 2019-08-07 ENCOUNTER — Ambulatory Visit (HOSPITAL_COMMUNITY)
Admission: RE | Admit: 2019-08-07 | Discharge: 2019-08-07 | Disposition: A | Discharge status: Home / Self Care | Payer: Medicaid Other | Source: Ambulatory Visit | Attending: Internal Medicine | Admitting: Internal Medicine

## 2019-08-07 ENCOUNTER — Inpatient Hospital Stay: Payer: Medicaid Other | Attending: Internal Medicine

## 2019-08-07 DIAGNOSIS — Z85118 Personal history of other malignant neoplasm of bronchus and lung: Secondary | ICD-10-CM | POA: Diagnosis present

## 2019-08-07 DIAGNOSIS — C3490 Malignant neoplasm of unspecified part of unspecified bronchus or lung: Secondary | ICD-10-CM | POA: Insufficient documentation

## 2019-08-07 LAB — CMP (CANCER CENTER ONLY)
ALT: 10 U/L (ref 0–44)
AST: 24 U/L (ref 15–41)
Albumin: 3.3 g/dL — ABNORMAL LOW (ref 3.5–5.0)
Alkaline Phosphatase: 95 U/L (ref 38–126)
Anion gap: 8 (ref 5–15)
BUN: 13 mg/dL (ref 6–20)
CO2: 28 mmol/L (ref 22–32)
Calcium: 9 mg/dL (ref 8.9–10.3)
Chloride: 101 mmol/L (ref 98–111)
Creatinine: 0.8 mg/dL (ref 0.44–1.00)
GFR, Est AFR Am: >60 mL/min (ref >60)
GFR, Estimated: >60 mL/min (ref >60)
Glucose, Bld: 102 mg/dL — ABNORMAL HIGH (ref 70–99)
Potassium: 3.9 mmol/L (ref 3.5–5.1)
Sodium: 137 mmol/L (ref 135–145)
Total Bilirubin: 0.4 mg/dL (ref 0.3–1.2)
Total Protein: 8.4 g/dL — ABNORMAL HIGH (ref 6.5–8.1)

## 2019-08-07 LAB — CBC WITH DIFFERENTIAL (CANCER CENTER ONLY)
Abs Immature Granulocytes: 0.02 10*3/uL (ref 0.00–0.07)
Basophils Absolute: 0.1 10*3/uL (ref 0.0–0.1)
Basophils Relative: 1 %
Eosinophils Absolute: 0.7 10*3/uL — ABNORMAL HIGH (ref 0.0–0.5)
Eosinophils Relative: 7 %
HCT: 36.6 % (ref 36.0–46.0)
Hemoglobin: 11.8 g/dL — ABNORMAL LOW (ref 12.0–15.0)
Immature Granulocytes: 0 %
Lymphocytes Relative: 24 %
Lymphs Abs: 2.2 10*3/uL (ref 0.7–4.0)
MCH: 29.7 pg (ref 26.0–34.0)
MCHC: 32.2 g/dL (ref 30.0–36.0)
MCV: 92.2 fL (ref 80.0–100.0)
Monocytes Absolute: 0.5 10*3/uL (ref 0.1–1.0)
Monocytes Relative: 5 %
Neutro Abs: 5.7 10*3/uL (ref 1.7–7.7)
Neutrophils Relative %: 63 %
Platelet Count: 252 10*3/uL (ref 150–400)
RBC: 3.97 MIL/uL (ref 3.87–5.11)
RDW: 13.6 % (ref 11.5–15.5)
WBC Count: 9.2 10*3/uL (ref 4.0–10.5)
nRBC: 0 % (ref 0.0–0.2)

## 2019-08-07 MED ORDER — SODIUM CHLORIDE (PF) 0.9 % IJ SOLN
INTRAMUSCULAR | Status: AC
  Filled 2019-08-07: qty 50

## 2019-08-07 MED ORDER — IOHEXOL 300 MG/ML  SOLN
75.0000 mL | Freq: Once | INTRAMUSCULAR | Status: AC | PRN
  Administered 2019-08-07: 75 mL via INTRAVENOUS

## 2019-08-14 ENCOUNTER — Inpatient Hospital Stay: Payer: Medicaid Other | Attending: Internal Medicine | Admitting: Internal Medicine

## 2019-08-14 ENCOUNTER — Other Ambulatory Visit: Payer: Self-pay

## 2019-08-14 ENCOUNTER — Encounter: Payer: Self-pay | Admitting: Internal Medicine

## 2019-08-14 VITALS — BP 144/76 | HR 63 | Temp 98.5°F | Resp 20 | Ht 63.0 in | Wt 96.6 lb

## 2019-08-14 DIAGNOSIS — C3432 Malignant neoplasm of lower lobe, left bronchus or lung: Secondary | ICD-10-CM | POA: Diagnosis not present

## 2019-08-14 DIAGNOSIS — C3490 Malignant neoplasm of unspecified part of unspecified bronchus or lung: Secondary | ICD-10-CM | POA: Diagnosis not present

## 2019-08-14 DIAGNOSIS — Z85118 Personal history of other malignant neoplasm of bronchus and lung: Secondary | ICD-10-CM | POA: Insufficient documentation
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
## 2019-08-16 ENCOUNTER — Telehealth: Payer: Self-pay | Admitting: Internal Medicine

## 2019-08-16 NOTE — Telephone Encounter (Signed)
Scheduled per los. Called and left msg. Mailed printout  °

## 2019-08-23 ENCOUNTER — Telehealth: Payer: Self-pay | Admitting: Medical Oncology

## 2019-08-23 NOTE — Telephone Encounter (Signed)
Mailed ct scan report.

## 2019-08-26 IMAGING — CT CT CHEST W/ CM
2 of 4 series · 15 of 36 positions shown, 18 images · IV contrast (ISOVUE 300)
Comparison: 09/25/2016

CLINICAL DATA: Followup lung cancer status post left lung
resection.

EXAM:
CT CHEST WITH CONTRAST
TECHNIQUE: Multidetector CT imaging of the chest was performed during
intravenous contrast administration.
CONTRAST:  75 cc of Nsovue-D66

[Series 2: axial st · axial · 0.60mm/px · z∈[-296,-20]mm · 12 of 164 slices shown, 15 images]
[im 13/164  mediastinal]
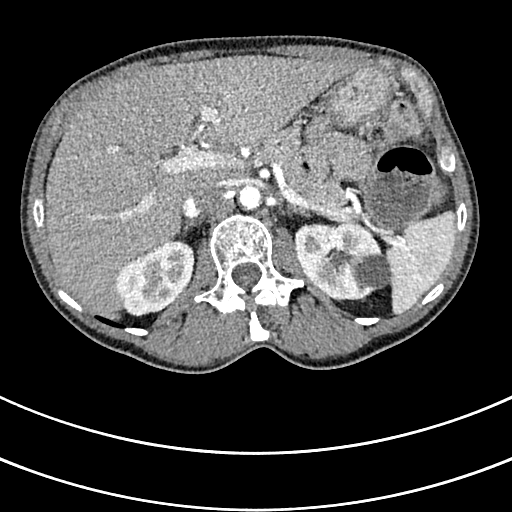
[im 13/164  lung]
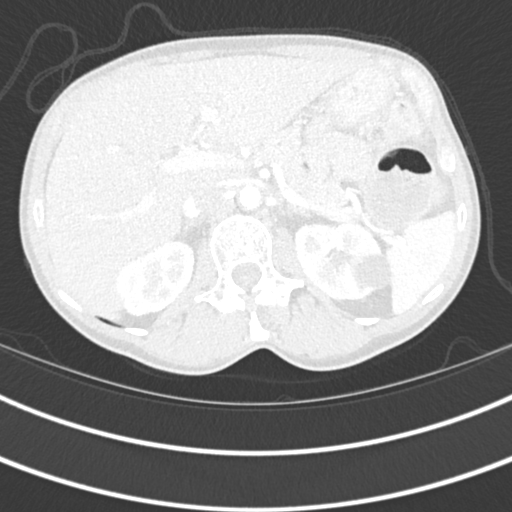
[im 26/164  lung]
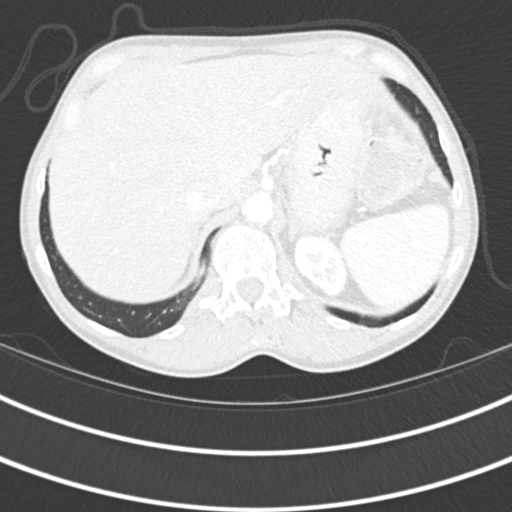
[im 38/164  lung]
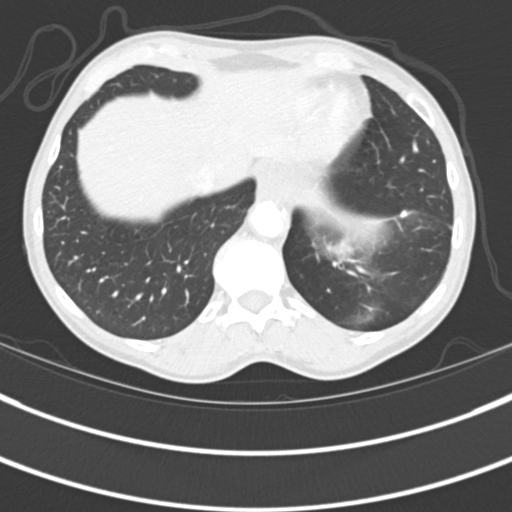
[im 51/164  lung]
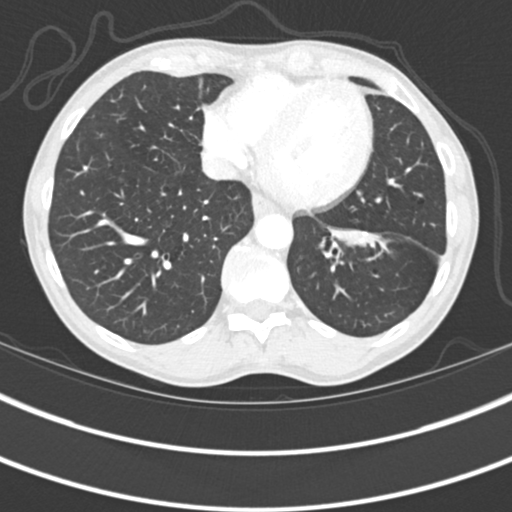
[im 63/164  mediastinal]
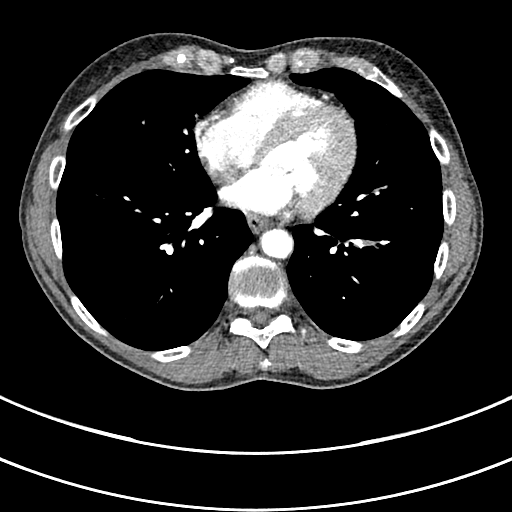
[im 63/164  lung]
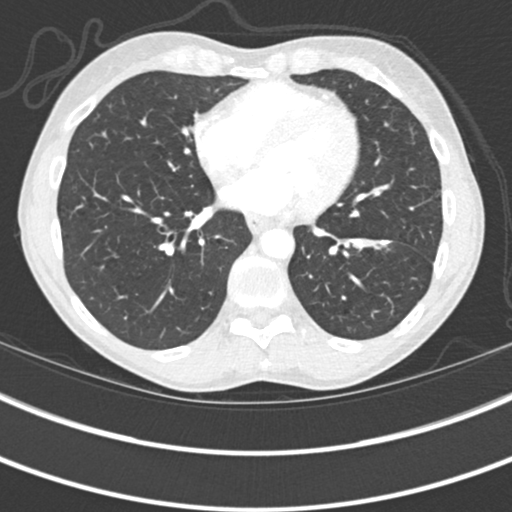
[im 76/164  lung]
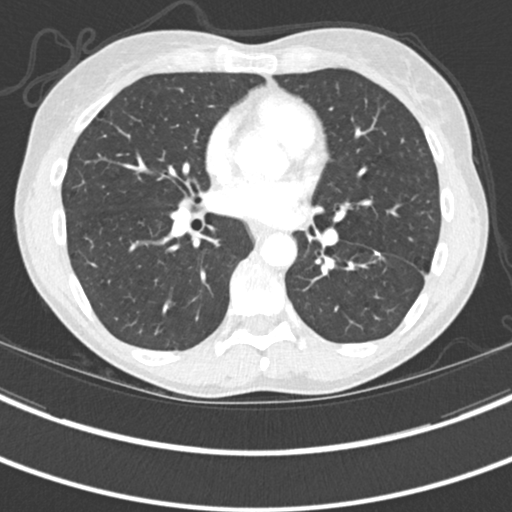
[im 88/164  lung]
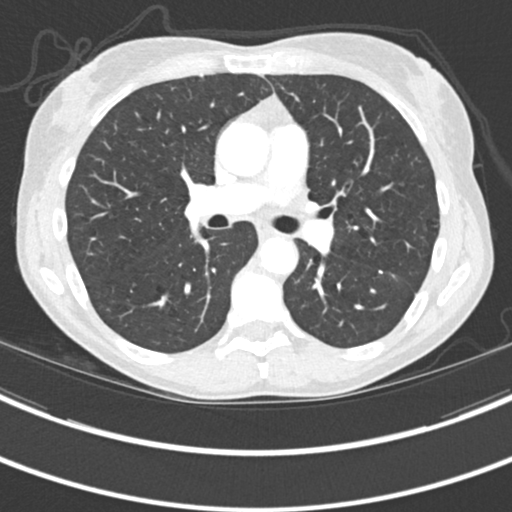
[im 101/164  lung]
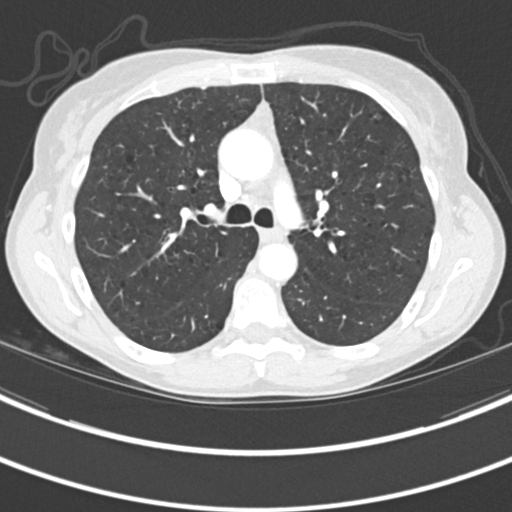
[im 113/164  mediastinal]
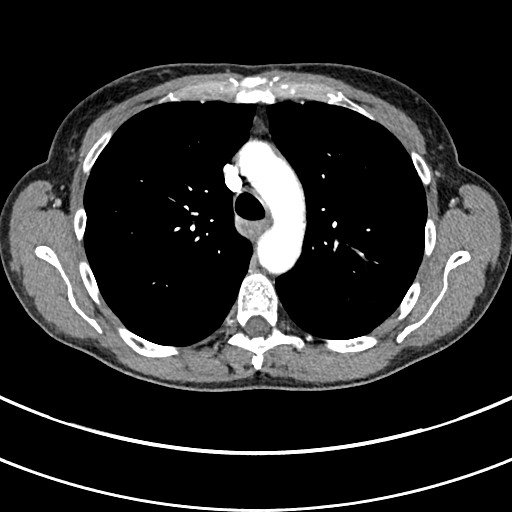
[im 113/164  lung]
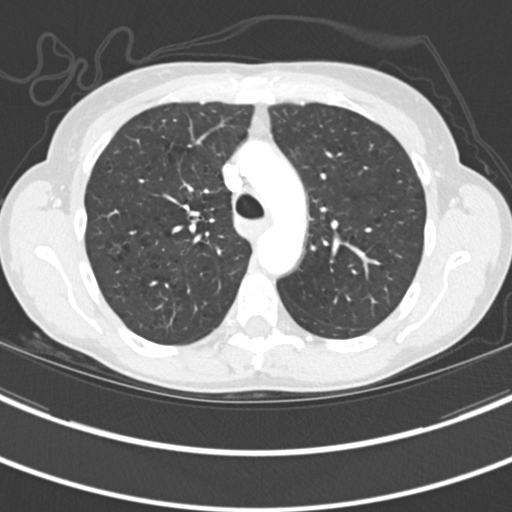
[im 126/164  lung]
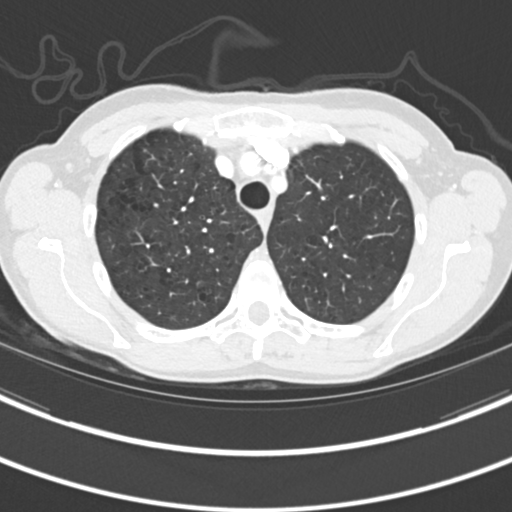
[im 138/164  lung]
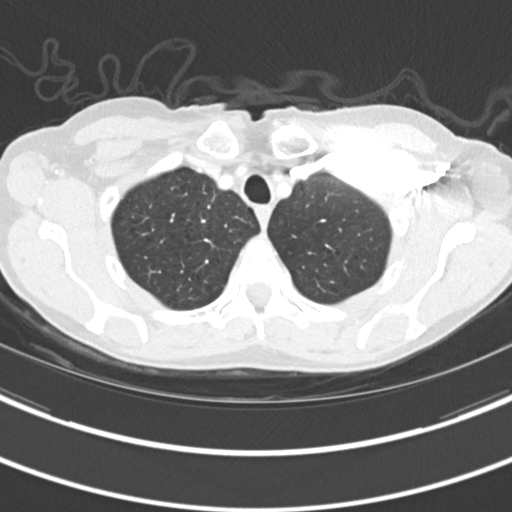
[im 151/164  lung]
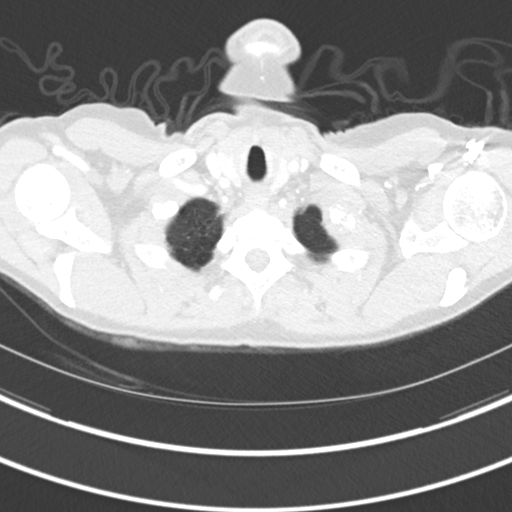

[Series 6: coronal · coronal · 0.61mm/px · 3 of 125 slices shown]
[im 25/125  lung]
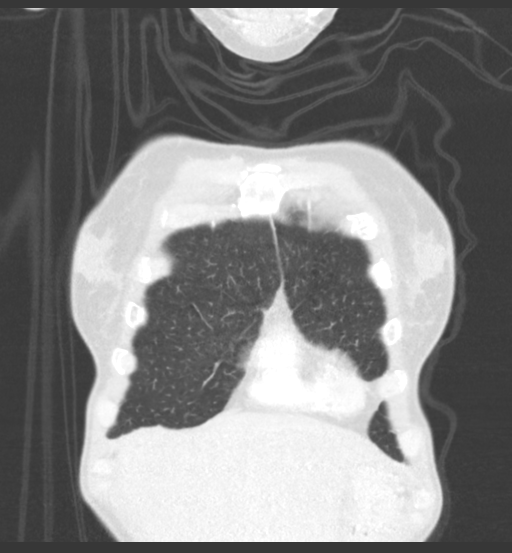
[im 50/125  lung]
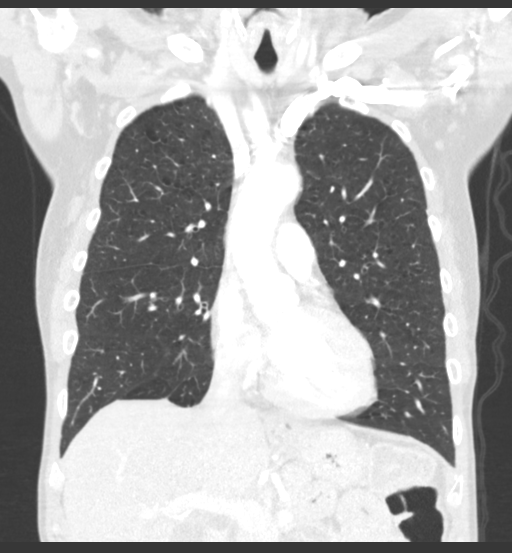
[im 75/125  lung]
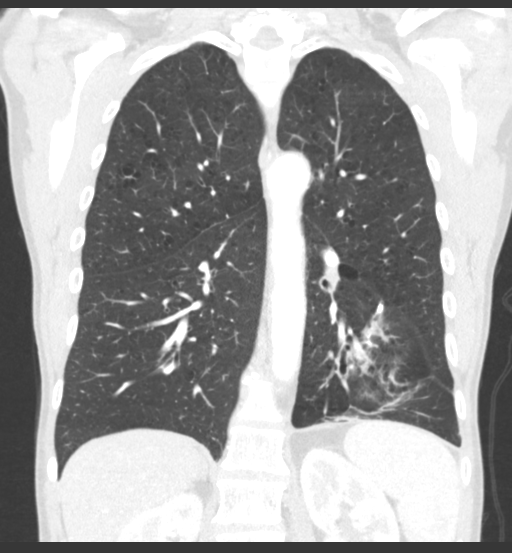

[15 of 36 positions shown; findings below may reference images not displayed]

FINDINGS: Cardiovascular: Normal heart size. Aortic atherosclerosis. No
pericardial effusion.

Mediastinum/Nodes: No enlarged mediastinal, hilar, or axillary lymph
nodes. Thyroid gland, trachea, and esophagus demonstrate no
significant findings.

Lungs/Pleura: Moderate change of emphysema. Soft tissue thickening
along the resection site is again noted and appears unchanged from
previous exam approximately 9 mm in thickness, image 113 of series
5. Pulmonary nodule within the right lower lobe is unchanged
measuring 4 mm, image 104 of series 5. Stable perifissural nodule in
the right lower lobe measuring 2 mm, image 66 of series 5.

Upper Abdomen: No acute abnormality.

Musculoskeletal: There are new mild superior endplate compression
fractures involving T4 and T7. There is an age indeterminate
fracture involving the posterolateral aspect of the left first rib.
Also new from previous exam.
IMPRESSION: 1. Stable appearance of the lungs. No specific findings identified
to suggest residual or recurrent tumor. Soft tissue along the suture
line in the left lower lobe is stable.
2. New compression deformities involving T4 and T7.
3. There is an age indeterminate fracture involving the left first
rib which is new from 09/25/2016
4. Aortic Atherosclerosis (CXMJS-0AZ.Z) and Emphysema (CXMJS-S8A.1).

## 2019-08-31 ENCOUNTER — Encounter: Payer: Self-pay | Admitting: Medical Oncology

## 2019-08-31 ENCOUNTER — Telehealth: Payer: Self-pay | Admitting: Medical Oncology

## 2019-08-31 NOTE — Telephone Encounter (Signed)
Letter mailed to  pt re diagnosis.

## 2020-01-16 ENCOUNTER — Encounter (HOSPITAL_COMMUNITY): Payer: Self-pay

## 2020-01-16 ENCOUNTER — Emergency Department (HOSPITAL_COMMUNITY)
Admission: EM | Admit: 2020-01-16 | Discharge: 2020-01-16 | Disposition: A | Discharge status: Home / Self Care | Payer: Medicaid Other | Attending: Emergency Medicine | Admitting: Emergency Medicine

## 2020-01-16 ENCOUNTER — Other Ambulatory Visit: Payer: Self-pay

## 2020-01-16 DIAGNOSIS — R112 Nausea with vomiting, unspecified: Secondary | ICD-10-CM | POA: Insufficient documentation

## 2020-01-16 DIAGNOSIS — Z5321 Procedure and treatment not carried out due to patient leaving prior to being seen by health care provider: Secondary | ICD-10-CM | POA: Diagnosis not present

## 2020-01-16 DIAGNOSIS — M549 Dorsalgia, unspecified: Secondary | ICD-10-CM | POA: Diagnosis not present

## 2020-01-16 LAB — COMPREHENSIVE METABOLIC PANEL
ALT: 11 U/L (ref 0–44)
AST: 18 U/L (ref 15–41)
Albumin: 4.1 g/dL (ref 3.5–5.0)
Alkaline Phosphatase: 106 U/L (ref 38–126)
Anion gap: 14 (ref 5–15)
BUN: 14 mg/dL (ref 6–20)
CO2: 26 mmol/L (ref 22–32)
Calcium: 9.8 mg/dL (ref 8.9–10.3)
Chloride: 99 mmol/L (ref 98–111)
Creatinine, Ser: 0.72 mg/dL (ref 0.44–1.00)
GFR calc non Af Amer: >60 mL/min (ref >60)
Glucose, Bld: 137 mg/dL — ABNORMAL HIGH (ref 70–99)
Potassium: 3.5 mmol/L (ref 3.5–5.1)
Sodium: 139 mmol/L (ref 135–145)
Total Bilirubin: 0.6 mg/dL (ref 0.3–1.2)
Total Protein: 10 g/dL — ABNORMAL HIGH (ref 6.5–8.1)

## 2020-01-16 LAB — CBC
HCT: 41.2 % (ref 36.0–46.0)
Hemoglobin: 13.4 g/dL (ref 12.0–15.0)
MCH: 28.2 pg (ref 26.0–34.0)
MCHC: 32.5 g/dL (ref 30.0–36.0)
MCV: 86.7 fL (ref 80.0–100.0)
Platelets: 413 10*3/uL — ABNORMAL HIGH (ref 150–400)
RBC: 4.75 MIL/uL (ref 3.87–5.11)
RDW: 16.2 % — ABNORMAL HIGH (ref 11.5–15.5)
WBC: 10.9 10*3/uL — ABNORMAL HIGH (ref 4.0–10.5)
nRBC: 0 % (ref 0.0–0.2)

## 2020-01-16 LAB — LIPASE, BLOOD: Lipase: 31 U/L (ref 11–51)

## 2020-01-16 MED ORDER — ONDANSETRON 4 MG PO TBDP
4.0000 mg | ORAL_TABLET | Freq: Once | ORAL | Status: AC | PRN
  Administered 2020-01-16: 4 mg via ORAL
  Filled 2020-01-16: qty 1

## 2020-01-16 NOTE — ED Triage Notes (Signed)
Per EMS, states patient complaining of back pain, nausea and vomiting-history of renal disorder-unable to keep meds down-frequent urination

## 2020-01-16 NOTE — ED Notes (Signed)
I called patient for a room and no one responded

## 2020-07-12 IMAGING — DX DG HAND COMPLETE 3+V*L*
3 series · 3 of 3 positions shown · non-contrast
Comparison: None.

CLINICAL DATA: Patient fell [REDACTED]. Left hand pain in the
fourth finger and fifth finger.

EXAM:
LEFT HAND - COMPLETE 3+ VIEW

[hand pa]
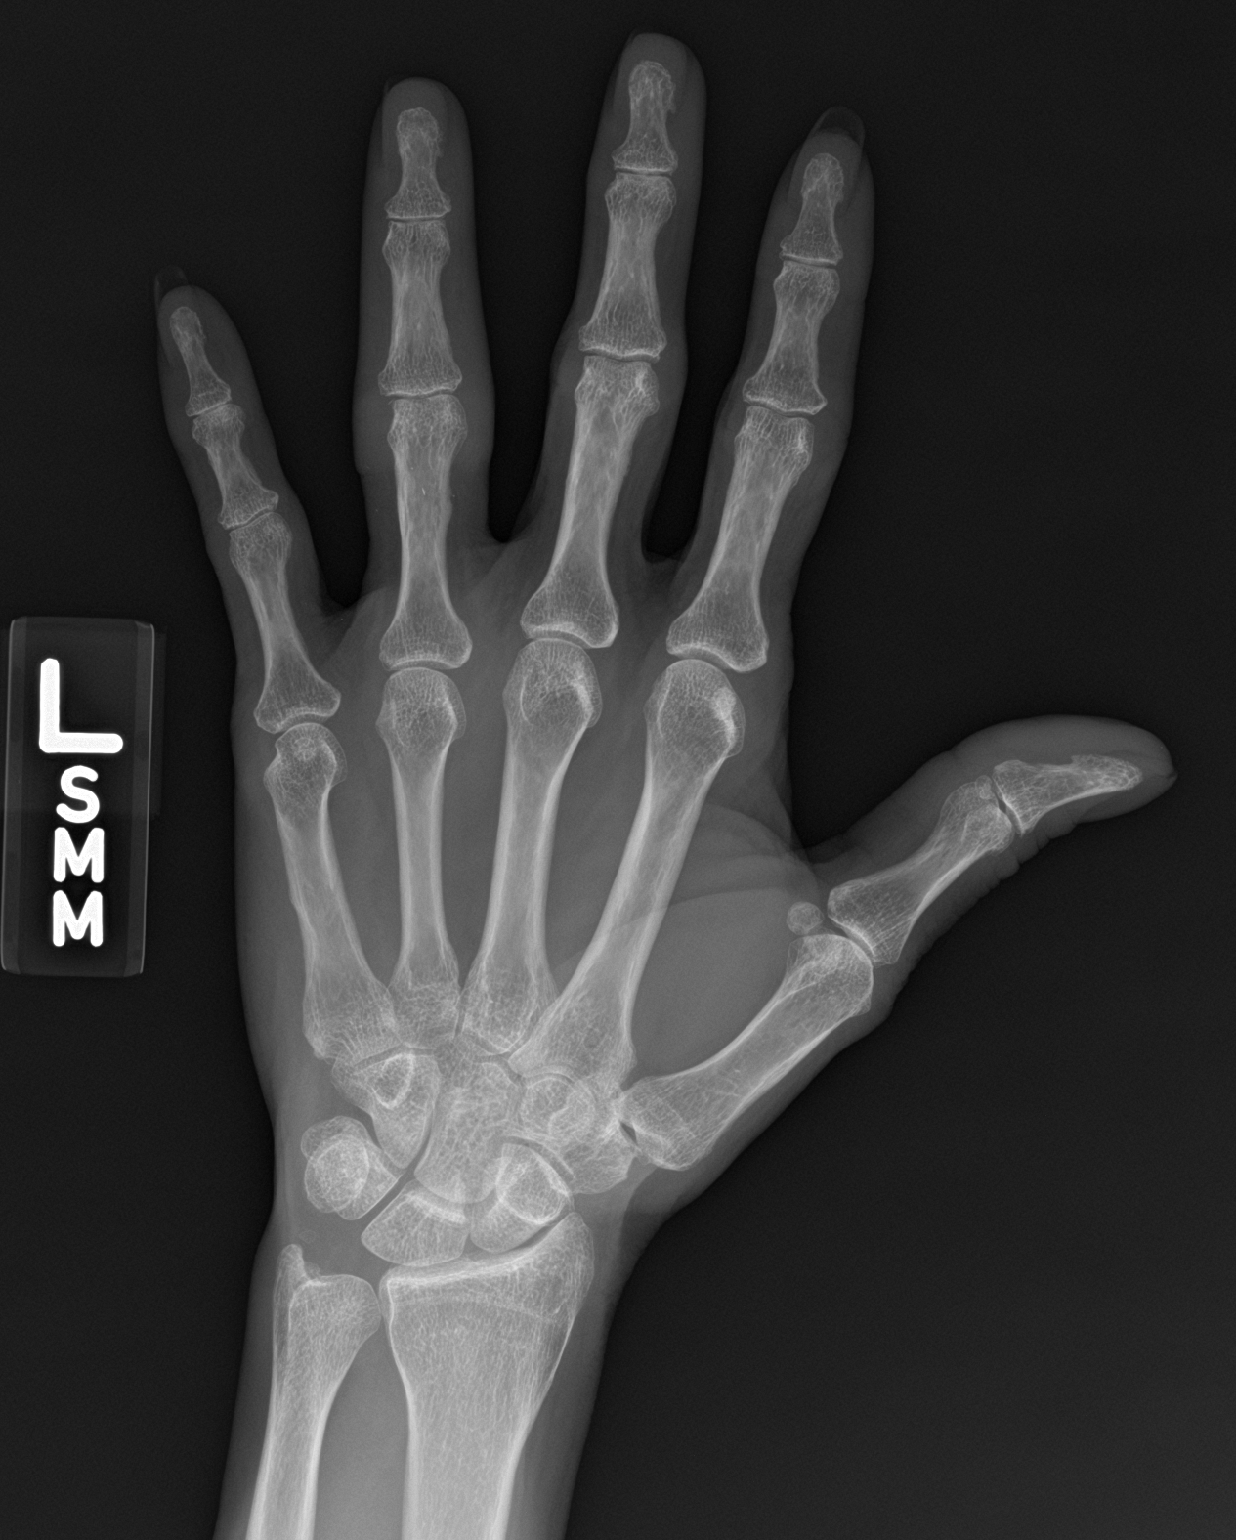

[hand obl]
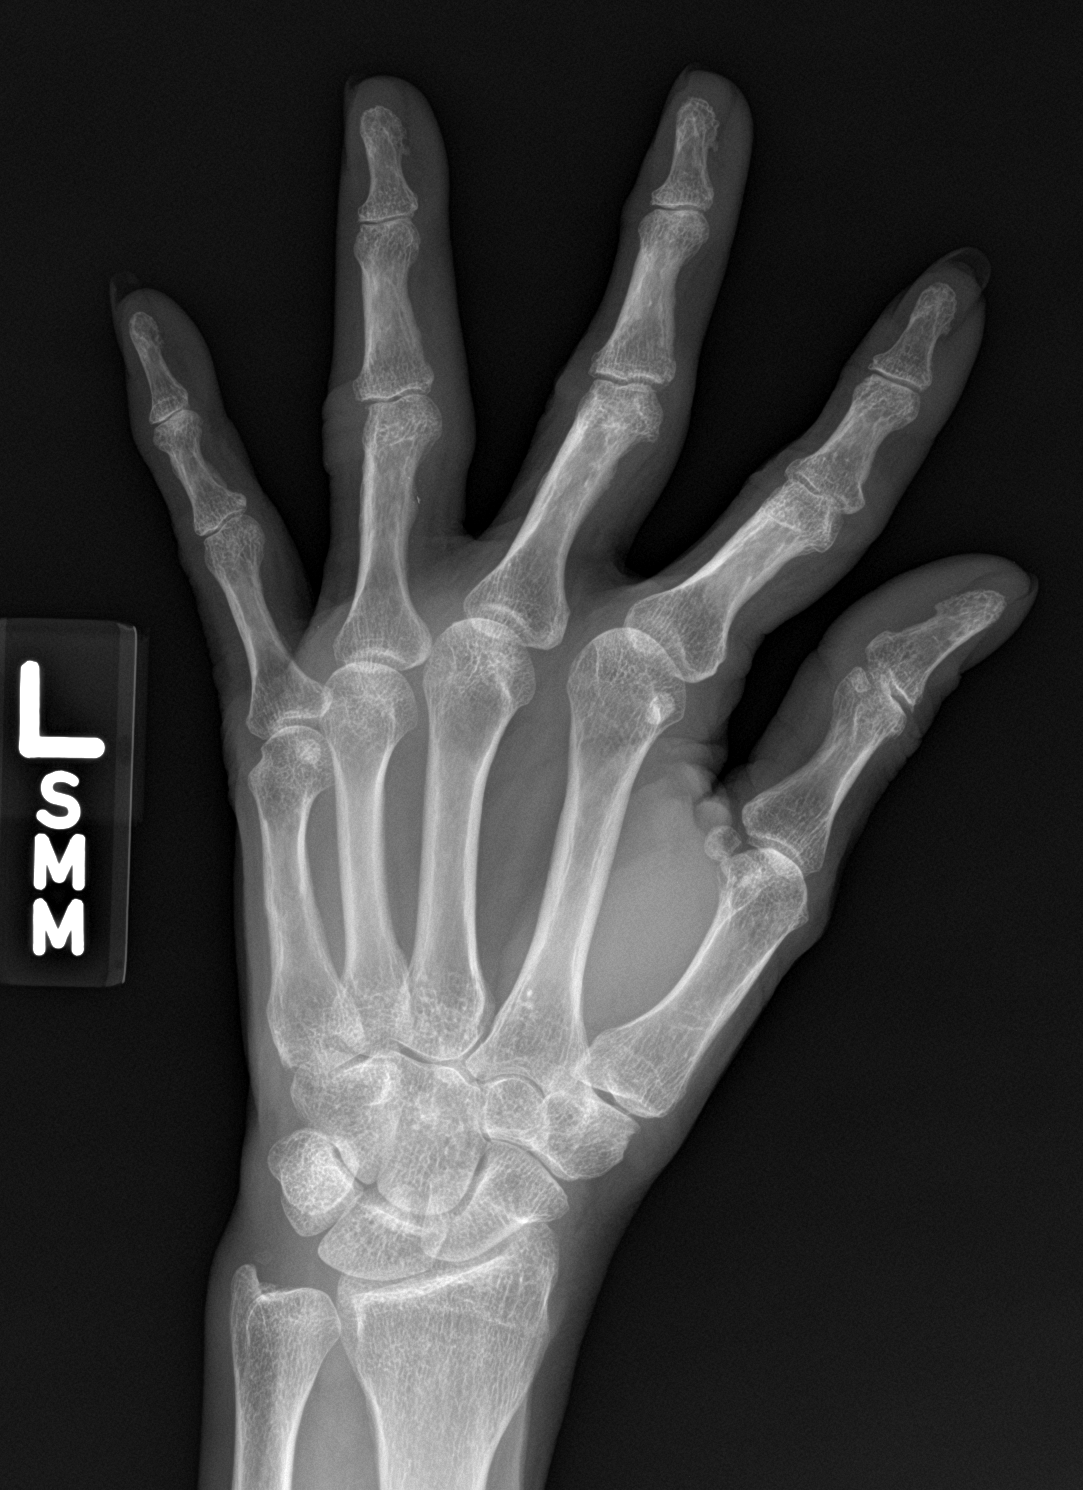

[hand lat]
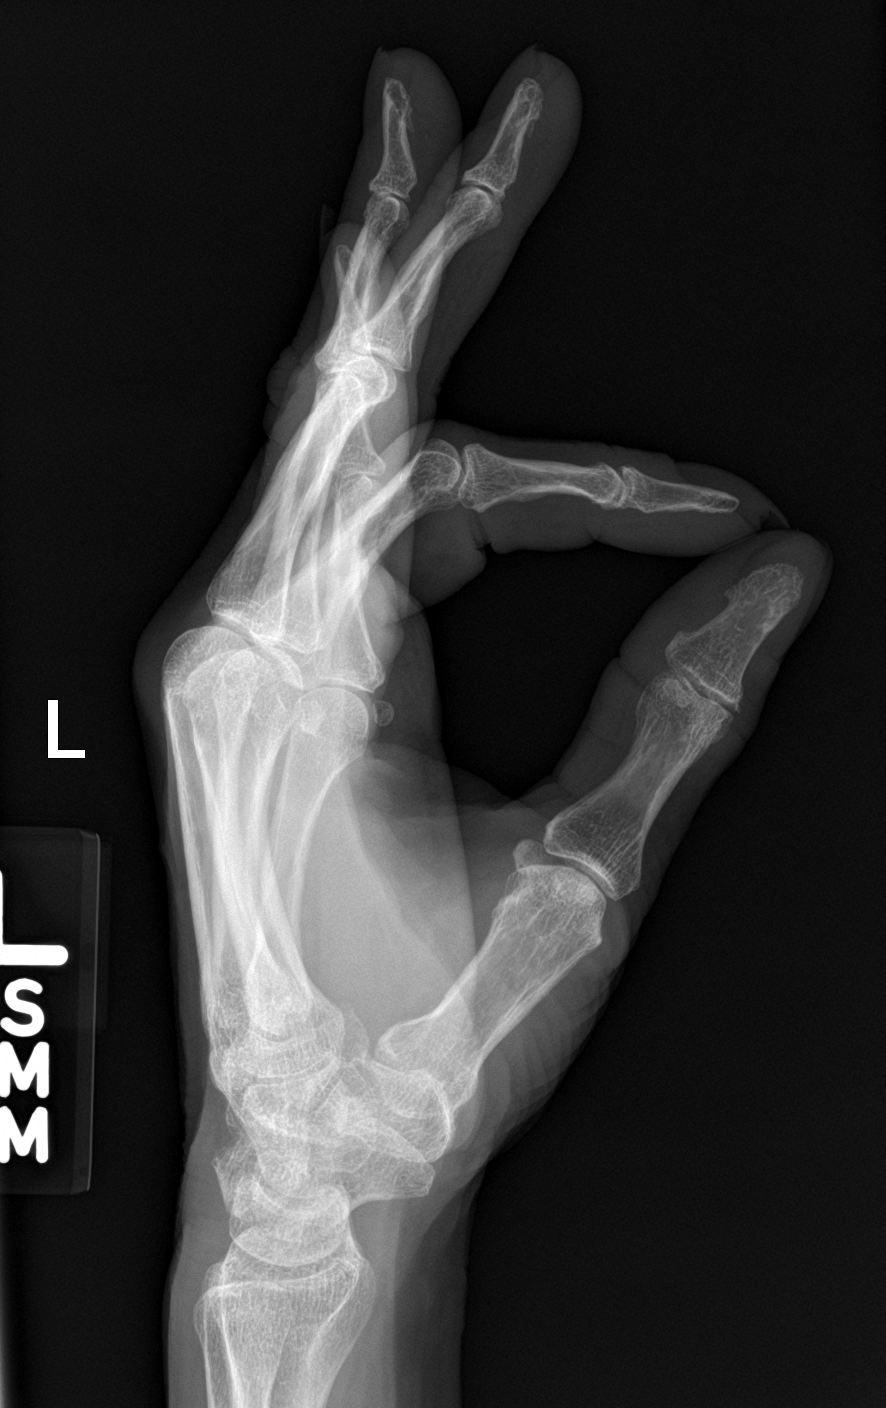

[3 of 3 positions shown; findings below may reference images not displayed]

FINDINGS: Degenerative changes in the interphalangeal joints, radiocarpal,
STT, and first metacarpal phalangeal joints of the left hand. No
evidence of acute fracture or subluxation. No focal bone lesion or
bone destruction. Bone cortex and trabecular architecture appear
intact. No radiopaque soft tissue foreign bodies.
IMPRESSION: Degenerative changes in the left hand. No acute bony abnormalities.

## 2020-08-12 ENCOUNTER — Inpatient Hospital Stay: Payer: Medicaid Other

## 2020-08-13 ENCOUNTER — Inpatient Hospital Stay: Payer: Medicaid Other | Admitting: Internal Medicine

## 2020-08-13 ENCOUNTER — Telehealth: Payer: Self-pay | Admitting: Medical Oncology

## 2020-08-13 NOTE — Telephone Encounter (Signed)
No show today . I LVM for pt to call me back.

## 2020-10-02 ENCOUNTER — Other Ambulatory Visit: Payer: Self-pay

## 2020-10-02 DIAGNOSIS — C3490 Malignant neoplasm of unspecified part of unspecified bronchus or lung: Secondary | ICD-10-CM

## 2020-11-19 ENCOUNTER — Inpatient Hospital Stay (HOSPITAL_COMMUNITY): Payer: Medicaid Other

## 2020-11-19 ENCOUNTER — Other Ambulatory Visit: Payer: Self-pay

## 2020-11-19 ENCOUNTER — Inpatient Hospital Stay (HOSPITAL_COMMUNITY)
Admission: EM | Admit: 2020-11-19 | Discharge: 2020-11-26 | DRG: 193 | Disposition: A | Discharge status: Home / Self Care | Payer: Medicaid Other | Attending: Internal Medicine | Admitting: Internal Medicine

## 2020-11-19 ENCOUNTER — Emergency Department (HOSPITAL_COMMUNITY): Payer: Medicaid Other

## 2020-11-19 ENCOUNTER — Encounter (HOSPITAL_COMMUNITY): Payer: Self-pay | Admitting: Pharmacy Technician

## 2020-11-19 DIAGNOSIS — J189 Pneumonia, unspecified organism: Secondary | ICD-10-CM | POA: Diagnosis not present

## 2020-11-19 DIAGNOSIS — E860 Dehydration: Secondary | ICD-10-CM | POA: Diagnosis present

## 2020-11-19 DIAGNOSIS — Z79899 Other long term (current) drug therapy: Secondary | ICD-10-CM | POA: Diagnosis not present

## 2020-11-19 DIAGNOSIS — R109 Unspecified abdominal pain: Secondary | ICD-10-CM | POA: Diagnosis not present

## 2020-11-19 DIAGNOSIS — E876 Hypokalemia: Secondary | ICD-10-CM | POA: Diagnosis present

## 2020-11-19 DIAGNOSIS — B9620 Unspecified Escherichia coli [E. coli] as the cause of diseases classified elsewhere: Secondary | ICD-10-CM | POA: Diagnosis present

## 2020-11-19 DIAGNOSIS — I471 Supraventricular tachycardia: Secondary | ICD-10-CM | POA: Diagnosis not present

## 2020-11-19 DIAGNOSIS — N12 Tubulo-interstitial nephritis, not specified as acute or chronic: Secondary | ICD-10-CM | POA: Diagnosis present

## 2020-11-19 DIAGNOSIS — N10 Acute tubulo-interstitial nephritis: Secondary | ICD-10-CM | POA: Diagnosis not present

## 2020-11-19 DIAGNOSIS — Z801 Family history of malignant neoplasm of trachea, bronchus and lung: Secondary | ICD-10-CM | POA: Diagnosis not present

## 2020-11-19 DIAGNOSIS — I2510 Atherosclerotic heart disease of native coronary artery without angina pectoris: Secondary | ICD-10-CM | POA: Diagnosis present

## 2020-11-19 DIAGNOSIS — Z681 Body mass index (BMI) 19 or less, adult: Secondary | ICD-10-CM | POA: Diagnosis not present

## 2020-11-19 DIAGNOSIS — I252 Old myocardial infarction: Secondary | ICD-10-CM

## 2020-11-19 DIAGNOSIS — E785 Hyperlipidemia, unspecified: Secondary | ICD-10-CM | POA: Diagnosis present

## 2020-11-19 DIAGNOSIS — Z823 Family history of stroke: Secondary | ICD-10-CM | POA: Diagnosis not present

## 2020-11-19 DIAGNOSIS — R9431 Abnormal electrocardiogram [ECG] [EKG]: Secondary | ICD-10-CM

## 2020-11-19 DIAGNOSIS — R112 Nausea with vomiting, unspecified: Secondary | ICD-10-CM

## 2020-11-19 DIAGNOSIS — E43 Unspecified severe protein-calorie malnutrition: Secondary | ICD-10-CM | POA: Diagnosis present

## 2020-11-19 DIAGNOSIS — Z7982 Long term (current) use of aspirin: Secondary | ICD-10-CM

## 2020-11-19 DIAGNOSIS — Z20822 Contact with and (suspected) exposure to covid-19: Secondary | ICD-10-CM | POA: Diagnosis present

## 2020-11-19 DIAGNOSIS — E871 Hypo-osmolality and hyponatremia: Secondary | ICD-10-CM | POA: Diagnosis not present

## 2020-11-19 DIAGNOSIS — Z8249 Family history of ischemic heart disease and other diseases of the circulatory system: Secondary | ICD-10-CM | POA: Diagnosis not present

## 2020-11-19 DIAGNOSIS — Z85118 Personal history of other malignant neoplasm of bronchus and lung: Secondary | ICD-10-CM

## 2020-11-19 DIAGNOSIS — G894 Chronic pain syndrome: Secondary | ICD-10-CM | POA: Diagnosis present

## 2020-11-19 DIAGNOSIS — F17210 Nicotine dependence, cigarettes, uncomplicated: Secondary | ICD-10-CM | POA: Diagnosis present

## 2020-11-19 DIAGNOSIS — F4310 Post-traumatic stress disorder, unspecified: Secondary | ICD-10-CM | POA: Diagnosis present

## 2020-11-19 DIAGNOSIS — I10 Essential (primary) hypertension: Secondary | ICD-10-CM | POA: Diagnosis present

## 2020-11-19 DIAGNOSIS — I160 Hypertensive urgency: Secondary | ICD-10-CM

## 2020-11-19 DIAGNOSIS — Z8673 Personal history of transient ischemic attack (TIA), and cerebral infarction without residual deficits: Secondary | ICD-10-CM

## 2020-11-19 LAB — URINALYSIS, ROUTINE W REFLEX MICROSCOPIC
Bilirubin Urine: NEGATIVE
Glucose, UA: NEGATIVE mg/dL
Hgb urine dipstick: NEGATIVE
Ketones, ur: 5 mg/dL — AB
Leukocytes,Ua: NEGATIVE
Nitrite: NEGATIVE
Protein, ur: >=300 mg/dL — AB
Specific Gravity, Urine: 1.039 — ABNORMAL HIGH (ref 1.005–1.030)
pH: 5 (ref 5.0–8.0)

## 2020-11-19 LAB — LIPASE, BLOOD: Lipase: 49 U/L (ref 11–51)

## 2020-11-19 LAB — COMPREHENSIVE METABOLIC PANEL
ALT: 12 U/L (ref 0–44)
ALT: 11 U/L (ref 0–44)
AST: 23 U/L (ref 15–41)
AST: 17 U/L (ref 15–41)
Albumin: 3.8 g/dL (ref 3.5–5.0)
Albumin: 3.4 g/dL — ABNORMAL LOW (ref 3.5–5.0)
Alkaline Phosphatase: 109 U/L (ref 38–126)
Alkaline Phosphatase: 93 U/L (ref 38–126)
Anion gap: 17 — ABNORMAL HIGH (ref 5–15)
Anion gap: 12 (ref 5–15)
BUN: 26 mg/dL — ABNORMAL HIGH (ref 6–20)
BUN: 26 mg/dL — ABNORMAL HIGH (ref 6–20)
CO2: 25 mmol/L (ref 22–32)
CO2: 25 mmol/L (ref 22–32)
Calcium: 10.4 mg/dL — ABNORMAL HIGH (ref 8.9–10.3)
Calcium: 9.3 mg/dL (ref 8.9–10.3)
Chloride: 96 mmol/L — ABNORMAL LOW (ref 98–111)
Chloride: 103 mmol/L (ref 98–111)
Creatinine, Ser: 0.74 mg/dL (ref 0.44–1.00)
Creatinine, Ser: 0.61 mg/dL (ref 0.44–1.00)
GFR, Estimated: >60 mL/min (ref >60)
GFR, Estimated: >60 mL/min (ref >60)
Glucose, Bld: 168 mg/dL — ABNORMAL HIGH (ref 70–99)
Glucose, Bld: 141 mg/dL — ABNORMAL HIGH (ref 70–99)
Potassium: 3.2 mmol/L — ABNORMAL LOW (ref 3.5–5.1)
Potassium: 3.5 mmol/L (ref 3.5–5.1)
Sodium: 138 mmol/L (ref 135–145)
Sodium: 140 mmol/L (ref 135–145)
Total Bilirubin: 0.7 mg/dL (ref 0.3–1.2)
Total Bilirubin: 0.4 mg/dL (ref 0.3–1.2)
Total Protein: 10 g/dL — ABNORMAL HIGH (ref 6.5–8.1)
Total Protein: 8.7 g/dL — ABNORMAL HIGH (ref 6.5–8.1)

## 2020-11-19 LAB — RESP PANEL BY RT-PCR (FLU A&B, COVID) ARPGX2
Influenza A by PCR: NEGATIVE
Influenza B by PCR: NEGATIVE
SARS Coronavirus 2 by RT PCR: NEGATIVE

## 2020-11-19 LAB — CBC
HCT: 47.8 % — ABNORMAL HIGH (ref 36.0–46.0)
Hemoglobin: 15.7 g/dL — ABNORMAL HIGH (ref 12.0–15.0)
MCH: 28.5 pg (ref 26.0–34.0)
MCHC: 32.8 g/dL (ref 30.0–36.0)
MCV: 86.8 fL (ref 80.0–100.0)
Platelets: 444 10*3/uL — ABNORMAL HIGH (ref 150–400)
RBC: 5.51 MIL/uL — ABNORMAL HIGH (ref 3.87–5.11)
RDW: 15.1 % (ref 11.5–15.5)
WBC: 22 10*3/uL — ABNORMAL HIGH (ref 4.0–10.5)
nRBC: 0 % (ref 0.0–0.2)

## 2020-11-19 MED ORDER — ACETAMINOPHEN 325 MG PO TABS
650.0000 mg | ORAL_TABLET | Freq: Four times a day (QID) | ORAL | Status: DC | PRN
  Administered 2020-11-25: 650 mg via ORAL
  Filled 2020-11-19: qty 2

## 2020-11-19 MED ORDER — MORPHINE SULFATE (PF) 4 MG/ML IV SOLN: 8.0000 mg | Freq: Once | INTRAVENOUS | Status: DC

## 2020-11-19 MED ORDER — IOHEXOL 300 MG/ML  SOLN
100.0000 mL | Freq: Once | INTRAMUSCULAR | Status: AC | PRN
  Administered 2020-11-19: 100 mL via INTRAVENOUS

## 2020-11-19 MED ORDER — SODIUM CHLORIDE 0.9 % IV SOLN
500.0000 mg | Freq: Once | INTRAVENOUS | Status: AC
  Administered 2020-11-19: 500 mg via INTRAVENOUS
  Filled 2020-11-19: qty 500

## 2020-11-19 MED ORDER — KETOROLAC TROMETHAMINE 15 MG/ML IJ SOLN
15.0000 mg | Freq: Once | INTRAMUSCULAR | Status: AC
  Administered 2020-11-19: 15 mg via INTRAVENOUS
  Filled 2020-11-19: qty 1

## 2020-11-19 MED ORDER — SODIUM CHLORIDE 0.9 % IV SOLN
1.0000 g | Freq: Once | INTRAVENOUS | Status: AC
  Administered 2020-11-19: 1 g via INTRAVENOUS
  Filled 2020-11-19: qty 10

## 2020-11-19 MED ORDER — ENSURE ENLIVE PO LIQD
237.0000 mL | Freq: Two times a day (BID) | ORAL | Status: DC
  Administered 2020-11-20: 237 mL via ORAL

## 2020-11-19 MED ORDER — POTASSIUM CHLORIDE 10 MEQ/100ML IV SOLN
10.0000 meq | INTRAVENOUS | Status: DC
Start: 2020-11-19 — End: 2020-11-19

## 2020-11-19 MED ORDER — POTASSIUM CHLORIDE 10 MEQ/100ML IV SOLN
10.0000 meq | INTRAVENOUS | Status: AC
Start: 2020-11-19 — End: 2020-11-20
  Administered 2020-11-19 – 2020-11-20 (×5): 10 meq via INTRAVENOUS
  Filled 2020-11-19 (×5): qty 100

## 2020-11-19 MED ORDER — LORAZEPAM 2 MG/ML IJ SOLN
1.0000 mg | INTRAMUSCULAR | Status: AC | PRN
Start: 2020-11-19 — End: 2020-11-20
  Administered 2020-11-20: 1 mg via INTRAVENOUS
  Filled 2020-11-19: qty 1

## 2020-11-19 MED ORDER — ONDANSETRON HCL 4 MG/2ML IJ SOLN
4.0000 mg | Freq: Once | INTRAMUSCULAR | Status: AC
  Administered 2020-11-19: 4 mg via INTRAVENOUS
  Filled 2020-11-19: qty 2

## 2020-11-19 MED ORDER — SODIUM CHLORIDE 0.9 % IV SOLN
1.0000 g | INTRAVENOUS | Status: DC
  Administered 2020-11-20 – 2020-11-21 (×2): 1 g via INTRAVENOUS
  Filled 2020-11-19 (×2): qty 10

## 2020-11-19 MED ORDER — HYDRALAZINE HCL 20 MG/ML IJ SOLN
20.0000 mg | Freq: Once | INTRAMUSCULAR | Status: AC
  Administered 2020-11-19: 20 mg via INTRAVENOUS
  Filled 2020-11-19: qty 1

## 2020-11-19 MED ORDER — MORPHINE SULFATE (PF) 4 MG/ML IV SOLN
4.0000 mg | Freq: Once | INTRAVENOUS | Status: AC
  Administered 2020-11-19: 4 mg via INTRAVENOUS
  Filled 2020-11-19: qty 1

## 2020-11-19 MED ORDER — DICYCLOMINE HCL 10 MG/ML IM SOLN: 20.0000 mg | Freq: Once | INTRAMUSCULAR | Status: DC

## 2020-11-19 MED ORDER — POTASSIUM CHLORIDE 10 MEQ/100ML IV SOLN: 10.0000 meq | INTRAVENOUS | Status: DC

## 2020-11-19 MED ORDER — FENTANYL CITRATE (PF) 100 MCG/2ML IJ SOLN
50.0000 ug | Freq: Once | INTRAMUSCULAR | Status: AC
  Administered 2020-11-19: 50 ug via INTRAVENOUS
  Filled 2020-11-19: qty 2

## 2020-11-19 MED ORDER — SCOPOLAMINE 1 MG/3DAYS TD PT72
1.0000 | MEDICATED_PATCH | TRANSDERMAL | Status: DC
  Administered 2020-11-19 – 2020-11-25 (×3): 1.5 mg via TRANSDERMAL
  Filled 2020-11-19 (×3): qty 1

## 2020-11-19 MED ORDER — HYDRALAZINE HCL 20 MG/ML IJ SOLN
10.0000 mg | INTRAMUSCULAR | Status: DC | PRN
  Administered 2020-11-19 – 2020-11-20 (×4): 10 mg via INTRAVENOUS
  Filled 2020-11-19 (×5): qty 1

## 2020-11-19 MED ORDER — DOXYCYCLINE HYCLATE 100 MG PO TABS
100.0000 mg | ORAL_TABLET | Freq: Two times a day (BID) | ORAL | Status: AC
  Administered 2020-11-20 – 2020-11-24 (×10): 100 mg via ORAL
  Filled 2020-11-19 (×11): qty 1

## 2020-11-19 MED ORDER — SODIUM CHLORIDE 0.9 % IV SOLN: INTRAVENOUS | Status: AC

## 2020-11-19 MED ORDER — SODIUM CHLORIDE 0.9 % IV SOLN: 500.0000 mg | INTRAVENOUS | Status: DC

## 2020-11-19 MED ORDER — SODIUM CHLORIDE 0.9 % IV BOLUS
1000.0000 mL | Freq: Once | INTRAVENOUS | Status: AC
  Administered 2020-11-19: 1000 mL via INTRAVENOUS

## 2020-11-19 MED ORDER — KETOROLAC TROMETHAMINE 15 MG/ML IJ SOLN
15.0000 mg | Freq: Three times a day (TID) | INTRAMUSCULAR | Status: AC | PRN
  Administered 2020-11-19 – 2020-11-20 (×2): 15 mg via INTRAVENOUS
  Filled 2020-11-19 (×2): qty 1

## 2020-11-19 MED ORDER — ACETAMINOPHEN 650 MG RE SUPP: 650.0000 mg | Freq: Four times a day (QID) | RECTAL | Status: DC | PRN

## 2020-11-19 MED ORDER — ENOXAPARIN SODIUM 40 MG/0.4ML IJ SOSY
40.0000 mg | PREFILLED_SYRINGE | INTRAMUSCULAR | Status: DC
  Administered 2020-11-19 – 2020-11-26 (×7): 40 mg via SUBCUTANEOUS
  Filled 2020-11-19 (×8): qty 0.4

## 2020-11-19 MED ORDER — LISINOPRIL 20 MG PO TABS
20.0000 mg | ORAL_TABLET | Freq: Once | ORAL | Status: AC
  Administered 2020-11-19: 20 mg via ORAL
  Filled 2020-11-19: qty 1

## 2020-11-19 NOTE — ED Notes (Signed)
I attempted to call report and she said charge RN hasn't looked at the pt yet and will have to call me back.

## 2020-11-19 NOTE — ED Notes (Signed)
Pt transported to floor via rn via stretcher with monitor. I changed pt's brief prior to going upstairs since it leaked around [place].

## 2020-11-19 NOTE — ED Notes (Signed)
Pt's pain still 10/10. Medicated. Lights turned out to encourage rest for pt. Denies further needs.

## 2020-11-19 NOTE — ED Triage Notes (Signed)
Pt here with reports of bil lower back pain and abdominal pain X3 days. Pt denies dysuria. Endorses emesis and decreased PO intake.
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
## 2020-11-19 NOTE — ED Notes (Signed)
Pt placed on/taken off bedpan. Peri-care completed. Still c/o 10/10 pain. Dr. Jeanell Sparrow notified and notified that her BP is still elevated. See new orders.

## 2020-11-19 NOTE — ED Notes (Signed)
X-ray at bedside

## 2020-11-19 NOTE — ED Notes (Signed)
Patient transported to CT 

## 2020-11-19 NOTE — ED Notes (Signed)
Pt put on/off bedpan. Peri-care done. 10/10 pain. Medicated with fentanyl. Denies further needs.

## 2020-11-19 NOTE — ED Notes (Signed)
[facility] Lab will add on urine culture

## 2020-11-19 NOTE — ED Notes (Signed)
Pt placed on/taken off bedpan and peri-care completed. Pt c/o pain and nausea still. Dr. Ree Kida. Pt denies further needs. Pt AxO x4.

## 2020-11-19 NOTE — ED Notes (Signed)
I messaged Dr. Marlowe Sax to see if she still wants the [facility] since pt received 2 doses of antibx.

## 2020-11-19 NOTE — ED Provider Notes (Addendum)
  Physical Exam  BP (!) 182/79   Pulse 73   Temp 98.2 F (36.8 C) (Oral)   Resp (!) 29   LMP 09/23/2007   SpO2 98%   Physical Exam  ED Course/Procedures     Procedures  MDM  60 yo history of lung cancer who continues to be monitored by oncology but is not currently undergoing any active treatment female bilateral flank pain r>l, nausea and vomiting, urinary frequency,  ? Pyelo, mild leukocytosis ? Contaminated urine. Abdomen nttp Right cva ttp CT pending  6:09 PM CT results with no acute findings in the abdomen but does have consolidative opacity in the left lower lobe which is new since prior CT Given patient's symptoms we will treat with broad-spectrum antibiotics of Rocephin and Zithromax. Patient has continued to vomit here in the ED. Plan admission for pneumonia with associated nausea vomiting and diarrhea. 7:20 PM Discussed above with Dr. Marlowe Sax.  Patient's blood pressure has been increasing and is now 203/83.  Hydralazine given here in ED. Dr. Marlowe Sax has accepted patient for admission         Pattricia Boss, MD 11/19/20 1811    Pattricia Boss, MD 11/19/20 [place]
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
## 2020-11-20 ENCOUNTER — Other Ambulatory Visit: Payer: Self-pay | Admitting: Internal Medicine

## 2020-11-20 DIAGNOSIS — I160 Hypertensive urgency: Secondary | ICD-10-CM

## 2020-11-20 DIAGNOSIS — J189 Pneumonia, unspecified organism: Secondary | ICD-10-CM | POA: Diagnosis not present

## 2020-11-20 DIAGNOSIS — C3490 Malignant neoplasm of unspecified part of unspecified bronchus or lung: Secondary | ICD-10-CM

## 2020-11-20 LAB — LIPID PANEL
Cholesterol: 177 mg/dL (ref 0–200)
HDL: 39 mg/dL — ABNORMAL LOW (ref >40)
LDL Cholesterol: 121 mg/dL — ABNORMAL HIGH (ref 0–99)
Total CHOL/HDL Ratio: 4.5 RATIO
Triglycerides: 86 mg/dL (ref <150)
VLDL: 17 mg/dL (ref 0–40)

## 2020-11-20 LAB — CBC
HCT: 39.6 % (ref 36.0–46.0)
Hemoglobin: 13.1 g/dL (ref 12.0–15.0)
MCH: 28.4 pg (ref 26.0–34.0)
MCHC: 33.1 g/dL (ref 30.0–36.0)
MCV: 85.9 fL (ref 80.0–100.0)
Platelets: 326 10*3/uL (ref 150–400)
RBC: 4.61 MIL/uL (ref 3.87–5.11)
RDW: 15.2 % (ref 11.5–15.5)
WBC: 17.9 10*3/uL — ABNORMAL HIGH (ref 4.0–10.5)
nRBC: 0 % (ref 0.0–0.2)

## 2020-11-20 LAB — BASIC METABOLIC PANEL
Anion gap: 8 (ref 5–15)
BUN: 20 mg/dL (ref 6–20)
CO2: 22 mmol/L (ref 22–32)
Calcium: 8.7 mg/dL — ABNORMAL LOW (ref 8.9–10.3)
Chloride: 104 mmol/L (ref 98–111)
Creatinine, Ser: 0.61 mg/dL (ref 0.44–1.00)
GFR, Estimated: >60 mL/min (ref >60)
Glucose, Bld: 133 mg/dL — ABNORMAL HIGH (ref 70–99)
Potassium: 3.8 mmol/L (ref 3.5–5.1)
Sodium: 134 mmol/L — ABNORMAL LOW (ref 135–145)

## 2020-11-20 LAB — MAGNESIUM: Magnesium: 2 mg/dL (ref 1.7–2.4)

## 2020-11-20 LAB — HIV ANTIBODY (ROUTINE TESTING W REFLEX): HIV Screen 4th Generation wRfx: NONREACTIVE

## 2020-11-20 MED ORDER — HYDROCHLOROTHIAZIDE 12.5 MG PO CAPS: 12.5000 mg | ORAL_CAPSULE | Freq: Every day | ORAL | Status: DC

## 2020-11-20 MED ORDER — MORPHINE SULFATE (PF) 2 MG/ML IV SOLN
1.0000 mg | INTRAVENOUS | Status: DC | PRN
  Administered 2020-11-20 – 2020-11-25 (×28): 2 mg via INTRAVENOUS
  Filled 2020-11-20 (×30): qty 1

## 2020-11-20 MED ORDER — HYDROCODONE-ACETAMINOPHEN 10-325 MG PO TABS
1.0000 | ORAL_TABLET | ORAL | Status: DC | PRN
Start: 2020-11-20 — End: 2020-11-25
  Administered 2020-11-20 – 2020-11-25 (×27): 1 via ORAL
  Filled 2020-11-20 (×28): qty 1

## 2020-11-20 MED ORDER — PROCHLORPERAZINE EDISYLATE 10 MG/2ML IJ SOLN
5.0000 mg | Freq: Once | INTRAMUSCULAR | Status: AC
  Administered 2020-11-20: 5 mg via INTRAVENOUS
  Filled 2020-11-20: qty 2

## 2020-11-20 MED ORDER — ENSURE ENLIVE PO LIQD
237.0000 mL | Freq: Three times a day (TID) | ORAL | Status: DC
  Administered 2020-11-20 – 2020-11-26 (×16): 237 mL via ORAL

## 2020-11-20 MED ORDER — HYDRALAZINE HCL 50 MG PO TABS
50.0000 mg | ORAL_TABLET | Freq: Three times a day (TID) | ORAL | Status: DC
  Administered 2020-11-20: 50 mg via ORAL
  Filled 2020-11-20: qty 1

## 2020-11-20 MED ORDER — ADULT MULTIVITAMIN W/MINERALS CH
1.0000 | ORAL_TABLET | Freq: Every day | ORAL | Status: DC
  Administered 2020-11-20 – 2020-11-26 (×5): 1 via ORAL
  Filled 2020-11-20 (×6): qty 1

## 2020-11-20 MED ORDER — HYDRALAZINE HCL 20 MG/ML IJ SOLN
10.0000 mg | Freq: Once | INTRAMUSCULAR | Status: AC
  Administered 2020-11-20: 10 mg via INTRAVENOUS
  Filled 2020-11-20: qty 1

## 2020-11-20 MED ORDER — HYDRALAZINE HCL 50 MG PO TABS
50.0000 mg | ORAL_TABLET | Freq: Four times a day (QID) | ORAL | Status: DC
  Administered 2020-11-20 – 2020-11-24 (×15): 50 mg via ORAL
  Filled 2020-11-20 (×16): qty 1

## 2020-11-20 MED ORDER — AMLODIPINE BESYLATE 10 MG PO TABS
10.0000 mg | ORAL_TABLET | Freq: Every day | ORAL | Status: DC
  Administered 2020-11-20 – 2020-11-26 (×7): 10 mg via ORAL
  Filled 2020-11-20 (×7): qty 1

## 2020-11-20 NOTE — Discharge Instructions (Signed)
Nutrition Post Hospital Stay °Proper nutrition can help your body recover from illness and injury.   °Foods and beverages high in protein, vitamins, and minerals help rebuild muscle loss, promote healing, & reduce fall risk.  ° °•In addition to eating healthy foods, a nutrition shake is an easy, delicious way to get the nutrition you need during and after your hospital stay ° °It is recommended that you continue to drink 2 bottles per day of:       Ensure Plus for at least 1 month (30 days) after your hospital stay  ° °Tips for adding a nutrition shake into your routine: °As allowed, drink one with vitamins or medications instead of water or juice °Enjoy one as a tasty mid-morning or afternoon snack °Drink cold or make a milkshake out of it °Drink one instead of milk with cereal or snacks °Use as a coffee creamer °  °Available at the following grocery stores and pharmacies:           °* [employer] * [employer] * [employer]  °* [employer]          * [employer] * [employer]  °* [facility]      * [employer]  * [employer]   °* [facility]  * [employer]   °* [facility] [phone]  °          °For COUPONS visit: [email or web address]/join or [email or web address]  ° °Suggested Substitutions °Ensure Plus = Boost Plus = Carnation Breakfast Essentials = Boost Compact °Ensure Active Clear = Boost Breeze °Glucerna Shake = Boost Glucose Control = Carnation Breakfast Essentials SUGAR FREE ° °  °

## 2020-11-20 NOTE — Progress Notes (Addendum)
PROGRESS NOTE    TRACHELLE LOW  YPP:[id number] DOB: December 03, 1960 DOA: 11/19/2020 PCP: Pcp, No    Chief Complaint  Patient presents with   Abdominal Pain   Back Pain    Brief Narrative:   ANGELLA MONTAS is a 60 y.o. female with medical history significant of stage IA non-small cell lung cancer status post left lower lobe anterior basilar segmentectomy in 2016 followed by Dr. Julien Nordmann and not on active treatment, CAD, CHF, hypertension, hyperlipidemia, TIA, IBS, PTSD, bipolar disorder, anxiety, depression, chronic back pain presenting to the ED with complaints of abdominal pain, flank pain, nausea, and vomiting.  In the ED, blood pressure significantly elevated with systolic above 458.  Not febrile or tachycardic.  Not hypoxic.  Labs showing WBC 22.0, hemoglobin 15.7, platelet count 444. Sodium 138, potassium 3.2, chloride 96, bicarb 25, BUN 26, creatinine 0.7, glucose 168.  Lipase and LFTs normal.  UA with negative nitrite, negative leukocytes, 6-10 WBCs, and many bacteria.  Urine culture pending.  Repeat CMP showing sodium 140, potassium 3.5, chloride 103, bicarb 25, BUN 26, creatinine 0.6, glucose 141, normal LFTs.  COVID test pending.  CT abdomen pelvis showing no acute intra-abdominal finding but does show consolidative opacity in the left lower lobe with nodular and elongated fingerlike projections suspicious for infection.  Radiologist recommending dedicated CT chest for further evaluation.  Chest x-ray showing left basilar infiltrate. Patient was given fentanyl, IV hydralazine 20 mg, Toradol, lisinopril, morphine, Zofran, ceftriaxone, azithromycin, and 1 L normal saline bolus.     Patient reports 3-day history of lower abdominal pain, bilateral flank pain, dysuria, urinary frequency, nausea, and vomiting.  Unable to tolerate p.o. intake.  States her temperature was 100 F today.  She was previously having diarrhea which has subsided.  Also complaining of cough and shortness of breath.  Patient  states she is vaccinated against COVID but does not know which vaccine.  She thinks she received 4 doses of the vaccine.  She does not take any medications at home.  Assessment & Plan:   Principal Problem:   CAP (community acquired pneumonia) Active Problems:   Hyperlipidemia   Abdominal pain   Hypertensive urgency   QT prolongation   Community-acquired pneumonia - Does have leukocytosis on labs but not febrile or tachycardic.  Not hypoxic.  Chest x-ray showing left basilar infiltrate.  CT abdomen pelvis showing consolidative opacity in the left lower lobe with nodular and elongated fingerlike projections suspicious for infection.  Radiologist recommending dedicated CT chest for further evaluation.  CT chest showing increasing lobulated soft tissue density adjacent to the suture lines. -Will be treated with IV Rocephin, and doxycycline for up pneumonia, avoid azithromycin given prolonged QTC. -I have discussed with patient primary oncologist, who will arrange for follow-up in 1 to 94-month after discharge as patient will likely need repeat imaging giving these findings related to infection versus current malignancy, have discussed these findings with the patient, and she is aware about the importance of the follow-up.   Abdominal pain, intractable nausea and vomiting -  Lipase and LFTs normal.  CT showing no acute intra-abdominal finding.  UA with bacteriuria but no significant pyuria and negative nitrite.  Does have a history of marijuana use which could be causing nausea/vomiting. -IV fluid hydration.  IV Toradol as needed for pain.  IV Ativan as needed for nausea/vomiting, scopolamine patch.  Avoid QT prolonging antiemetics.  Urine culture pending.  Check UDS.   Hypertensive urgency -Blood pressure remains significantly elevated despite  received multiple dosing of IV allysine, and adding p.o. Norvasc, so I have added oral hydralazine as well.   QT prolongation -Cardiac monitoring.  Keep  potassium above 4 and magnesium above 2.  Avoid QT prolonging drugs if possible.  R   Stage IA non-small cell lung cancer Status post left lower lobe anterior basilar segmentectomy in 2016 followed by Dr. Julien Nordmann and not on active treatment. -Please see above discussion, discussed with Dr. Earlie Server, he will arrange for outpatient follow-up when he is treated.   Chronic pain syndrome -Complaining of generalized pain, likely pain is contributing to her uncontrolled blood pressure, I did start on as needed Vicodin and morphine.  CAD -Denies any chest pain, continue with aspirin.  CHF, unspecified chronicity or type Documented in past medical history but not able to see any echo results in the chart.  No signs of volume overload.  PCM -Her BMI is 15, will consult nutritionist.     DVT prophylaxis: Lovenox Code Status: Full Family Communication: none at bedside Disposition:   Status is: Inpatient  Remains inpatient appropriate because:IV treatments appropriate due to intensity of illness or inability to take PO  Dispo: The patient is from: Home              Anticipated d/c is to: Home              Patient currently is not medically stable to d/c.   Difficult to place patient No       Consultants:  D/w oncology by Phone   Subjective:  Does report cough, she reports generalized pain.  Objective: Vitals:   11/20/20 0926 11/20/20 1000 11/20/20 1013 11/20/20 1045  BP:   (!) 189/78 (!) 190/84  Pulse:  75 72 71  Resp:      Temp:      TempSrc:      SpO2:  98% 97% 96%  Weight:      Height: 5\' 3"  (1.6 m)       Intake/Output Summary (Last 24 hours) at 11/20/2020 1054 Last data filed at 11/20/2020 0641 Gross per 24 hour  Intake 1450 ml  Output 900 ml  Net 550 ml   Filed Weights   11/20/20 0500  Weight: 40.9 kg    Examination:  Awake Alert, frail, thin appearing, in mild distress secondary to generalized pain  symmetrical Chest wall movement, Good air movement  bilaterally, CTAB RRR,No Gallops,Rubs or new Murmurs, No Parasternal Heave +ve B.Sounds, Abd Soft, No tenderness, No rebound - guarding or rigidity. No Cyanosis, Clubbing or edema, No new Rash or bruise     Data Reviewed: I have personally reviewed following labs and imaging studies  CBC: Recent Labs  Lab 11/19/20 0843 11/20/20 0336  WBC 22.0* 17.9*  HGB 15.7* 13.1  HCT 47.8* 39.6  MCV 86.8 85.9  PLT 444* 630    Basic Metabolic Panel: Recent Labs  Lab 11/19/20 0843 11/19/20 1336 11/19/20 2309 11/20/20 0336  NA 138 140  --  134*  K 3.2* 3.5  --  3.8  CL 96* 103  --  104  CO2 25 25  --  22  GLUCOSE 168* 141*  --  133*  BUN 26* 26*  --  20  CREATININE 0.74 0.61  --  0.61  CALCIUM 10.4* 9.3  --  8.7*  MG  --   --  2.0  --     GFR: Estimated Creatinine Clearance: 48.9 mL/min (by C-G formula based on SCr of 0.61  mg/dL).  Liver Function Tests: Recent Labs  Lab 11/19/20 0843 11/19/20 1336  AST 23 17  ALT 12 11  ALKPHOS 109 93  BILITOT 0.7 0.4  PROT 10.0* 8.7*  ALBUMIN 3.8 3.4*    CBG: No results for input(s): GLUCAP in the last 168 hours.   Recent Results (from the past 240 hour(s))  Resp Panel by RT-PCR (Flu A&B, Covid) Nasopharyngeal Swab     Status: None   Collection Time: 11/19/20  7:24 PM   Specimen: Nasopharyngeal Swab; Nasopharyngeal(NP) swabs in vial transport medium  Result Value Ref Range Status   SARS Coronavirus 2 by RT PCR NEGATIVE NEGATIVE Final    Comment: (NOTE) SARS-CoV-2 target nucleic acids are NOT DETECTED.  The SARS-CoV-2 RNA is generally detectable in upper respiratory specimens during the acute phase of infection. The lowest concentration of SARS-CoV-2 viral copies this assay can detect is 138 copies/mL. A negative result does not preclude SARS-Cov-2 infection and should not be used as the sole basis for treatment or other patient management decisions. A negative result may occur with  improper specimen collection/handling,  submission of specimen other than nasopharyngeal swab, presence of viral mutation(s) within the areas targeted by this assay, and inadequate number of viral copies(<138 copies/mL). A negative result must be combined with clinical observations, patient history, and epidemiological information. The expected result is Negative.  Fact Sheet for Patients:  EntrepreneurPulse.com.au  Fact Sheet for Healthcare Providers:  IncredibleEmployment.be  This test is no t yet approved or cleared by the [place] FDA and  has been authorized for detection and/or diagnosis of SARS-CoV-2 by FDA under an Emergency Use Authorization (EUA). This EUA will remain  in effect (meaning this test can be used) for the duration of the COVID-19 declaration under Section 564(b)(1) of the Act, 21 U.S.C.section 360bbb-3(b)(1), unless the authorization is terminated  or revoked sooner.       Influenza A by PCR NEGATIVE NEGATIVE Final   Influenza B by PCR NEGATIVE NEGATIVE Final    Comment: (NOTE) The Xpert Xpress SARS-CoV-2/FLU/RSV plus assay is intended as an aid in the diagnosis of influenza from Nasopharyngeal swab specimens and should not be used as a sole basis for treatment. Nasal washings and aspirates are unacceptable for Xpert Xpress SARS-CoV-2/FLU/RSV testing.  Fact Sheet for Patients: EntrepreneurPulse.com.au  Fact Sheet for Healthcare Providers: IncredibleEmployment.be  This test is not yet approved or cleared by the [place] FDA and has been authorized for detection and/or diagnosis of SARS-CoV-2 by FDA under an Emergency Use Authorization (EUA). This EUA will remain in effect (meaning this test can be used) for the duration of the COVID-19 declaration under Section 564(b)(1) of the Act, 21 U.S.C. section 360bbb-3(b)(1), unless the authorization is terminated or revoked.  Performed at [facility], [place] [address], [place], [place] [address]          Radiology Studies: CT CHEST WO CONTRAST  Result Date: 11/19/2020 CLINICAL DATA:  Respiratory failure. Prior radiologic records states non-small cell lung cancer. EXAM: CT CHEST WITHOUT CONTRAST TECHNIQUE: Multidetector CT imaging of the chest was performed following the standard protocol without IV contrast. COMPARISON:  Chest radiograph earlier today. Lung bases from abdominal CT earlier today. Chest CT 08/07/2019 FINDINGS: Cardiovascular: Moderate aortic atherosclerosis. No aortic aneurysm. No periaortic stranding. Heart is normal in size. There are coronary artery calcifications. No pericardial effusion. Mediastinum/Nodes: Limited assessment for adenopathy in the absence of contrast and paucity of mediastinal fat. Few lower paratracheal lymph nodes are  not enlarged by size criteria. Limited hilar assessment in the absence of IV contrast. No esophageal wall thickening. Mild thyroid heterogeneity without dominant nodule. Similar appearance of prominent bilateral axillary nodes. Largest on the right measures 9 mm. Largest on the left measures 8 mm. Lungs/Pleura: Left lower lobe suture line again seen. There is increasing adjacent lobulated soft tissue density, adjacent to the suture line. Allowing for same caliper placement this measures 11 mm, previously 8 mm, however this is extended in the transverse and AP dimension, currently measuring 1.9 x 3.7 cm. There are curvilinear streaky densities extending peripherally to the pleural surface with mild pleural thickening in the lower left hemithorax. Moderate underlying emphysema. Previous paramediastinal triangular soft tissue density in the right middle lobe abutting the heart border has resolved. No new pulmonary nodule or mass. No findings of pulmonary edema. No additional sites of airspace disease. Trachea and central bronchi are patent. Upper Abdomen: Assessed on abdominopelvic CT earlier today. Musculoskeletal:  Chronic mild compression fractures of T4 and T7. No acute osseous abnormalities are seen. No focal bone lesion. IMPRESSION: 1. Left lower lobe suture line again seen. There is increasing lobulated soft tissue density adjacent to the suture line since April 2021 exam, currently measuring 1.9 x 3.7 cm. Differential considerations include atypical infection or disease recurrence. Recommend short interval follow-up exam after course of treatment. Follow-up PET-CT or bronchoscopy may be of value. 2. Previous paramediastinal triangular soft tissue density in the right middle lobe abutting the heart border has resolved. 3. Emphysema and aortic atherosclerosis. Coronary artery calcifications. Aortic Atherosclerosis (ICD10-I70.0) and Emphysema (ICD10-J43.9). Electronically Signed   By: Keith Rake M.D.   On: 11/19/2020 22:43   CT ABDOMEN PELVIS W CONTRAST  Result Date: 11/19/2020 CLINICAL DATA:  Diffuse abdominal pain. EXAM: CT ABDOMEN AND PELVIS WITH CONTRAST TECHNIQUE: Multidetector CT imaging of the abdomen and pelvis was performed using the standard protocol following bolus administration of intravenous contrast. CONTRAST:  145mL OMNIPAQUE IOHEXOL 300 MG/ML  SOLN COMPARISON:  03/04/2019 FINDINGS: Lower chest: Increased volume loss and consolidative opacity in the left lower lobe in this patient status post anterior basal segmentectomy. Some of the increased opacity has a nodular configuration while other areas are more elongated, finger-like projections. Findings may reflect progression of an atypical infectious etiology. Hepatobiliary: No suspicious focal abnormality within the liver parenchyma. Gallbladder is nondistended. No intrahepatic or extrahepatic biliary dilation. Pancreas: No focal mass lesion. No dilatation of the main duct. No intraparenchymal cyst. No peripancreatic edema. Spleen: No splenomegaly. No focal mass lesion. Adrenals/Urinary Tract: No adrenal nodule or mass. 7 mm low-density lesion lower  pole right kidney is too small to characterize but likely benign. 3.1 cm well-defined homogeneous low-density lesion in the upper pole pole left kidney is similar to prior, compatible with a cyst. No evidence for hydroureter. The urinary bladder appears normal for the degree of distention. Stomach/Bowel: Stomach is moderately distended with fluid. Duodenum shows fluid filled distended lumen. No small bowel dilatation. Terminal ileum not discretely evident. The appendix is not well visualized, but there is no edema or inflammation in the region of the cecum. No gross colonic mass. No colonic wall thickening. Vascular/Lymphatic: There is advanced atherosclerotic calcification of the abdominal aorta without aneurysm. There is no gastrohepatic or hepatoduodenal ligament lymphadenopathy. No retroperitoneal or mesenteric lymphadenopathy. No pelvic sidewall lymphadenopathy. Reproductive: Uterus surgically absent. Dominant follicle noted left ovary. No right adnexal mass. Other: No intraperitoneal free fluid. Musculoskeletal: Degenerative changes noted in both hips. Patient is fused across  the L3-4 interspace. IMPRESSION: 1. No acute findings in the abdomen or pelvis. Specifically, no findings to explain the patient's history of abdominal pain. 2. Since prior abdomen/pelvis CT and chest CT of 08/07/2019, increased volume loss and consolidative opacity in the left lower lobe in this patient status post anterior basal segmentectomy. Some of the increased opacity has a nodular configuration while other areas are more elongated, finger-like projections. Findings may reflect progression of an atypical infectious etiology. Dedicated CT chest recommended to further evaluate. 3. Aortic Atherosclerosis (ICD10-I70.0). Electronically Signed   By: Misty Stanley M.D.   On: 11/19/2020 17:42   DG Chest Port 1 View  Result Date: 11/19/2020 CLINICAL DATA:  Cough and leukocytosis EXAM: PORTABLE CHEST 1 VIEW COMPARISON:  03/04/2019  FINDINGS: Cardiac shadow is stable. Aortic calcifications are noted. Skin fold is noted over the right apex simulating pneumothorax although lung markings are noted beyond this density. Similar findings are noted on the left. Lungs are hyperinflated with mild left basilar infiltrate similar to that seen on recent CT examination. No sizable effusion is noted. No bony abnormality is seen. IMPRESSION: Left basilar infiltrate similar to that seen on prior CT. Aortic Atherosclerosis (ICD10-I70.0) and Emphysema (ICD10-J43.9). Electronically Signed   By: Inez Catalina M.D.   On: 11/19/2020 19:07        Scheduled Meds:  amLODipine  10 mg Oral Daily   doxycycline  100 mg Oral Q12H   enoxaparin (LOVENOX) injection  40 mg Subcutaneous Q24H   feeding supplement  237 mL Oral BID BM   hydrALAZINE  50 mg Oral TID   scopolamine  1 patch Transdermal Q72H   Continuous Infusions:  cefTRIAXone (ROCEPHIN)  IV       LOS: 1 day       Phillips Climes, MD [facility]   To contact the attending provider between 7A-7P or the covering provider during after hours 7P-7A, please log into the web site [email or web address] and access using universal [place] password for that web site. If you do not have the password, please call the hospital operator.  11/20/2020, 10:54 AM
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
## 2020-11-20 NOTE — Progress Notes (Signed)
Note FYI: Patients labs were drawn this am while K runs were still infusing. K resulted at 3.8 and pt had one additional run to complete. Jessie Foot, RN

## 2020-11-21 ENCOUNTER — Telehealth: Payer: Self-pay | Admitting: Internal Medicine

## 2020-11-21 DIAGNOSIS — R109 Unspecified abdominal pain: Secondary | ICD-10-CM

## 2020-11-21 DIAGNOSIS — J189 Pneumonia, unspecified organism: Secondary | ICD-10-CM | POA: Diagnosis not present

## 2020-11-21 DIAGNOSIS — E43 Unspecified severe protein-calorie malnutrition: Secondary | ICD-10-CM | POA: Diagnosis not present

## 2020-11-21 LAB — BASIC METABOLIC PANEL
Anion gap: 14 (ref 5–15)
BUN: 17 mg/dL (ref 6–20)
CO2: 18 mmol/L — ABNORMAL LOW (ref 22–32)
Calcium: 9.1 mg/dL (ref 8.9–10.3)
Chloride: 101 mmol/L (ref 98–111)
Creatinine, Ser: 0.65 mg/dL (ref 0.44–1.00)
GFR, Estimated: >60 mL/min (ref >60)
Glucose, Bld: 166 mg/dL — ABNORMAL HIGH (ref 70–99)
Potassium: 3.5 mmol/L (ref 3.5–5.1)
Sodium: 133 mmol/L — ABNORMAL LOW (ref 135–145)

## 2020-11-21 LAB — CBC
HCT: 42.3 % (ref 36.0–46.0)
Hemoglobin: 13.8 g/dL (ref 12.0–15.0)
MCH: 28.5 pg (ref 26.0–34.0)
MCHC: 32.6 g/dL (ref 30.0–36.0)
MCV: 87.2 fL (ref 80.0–100.0)
Platelets: 301 10*3/uL (ref 150–400)
RBC: 4.85 MIL/uL (ref 3.87–5.11)
RDW: 15.1 % (ref 11.5–15.5)
WBC: 13.4 10*3/uL — ABNORMAL HIGH (ref 4.0–10.5)
nRBC: 0 % (ref 0.0–0.2)

## 2020-11-21 LAB — URINE CULTURE: Culture: >=100000 — AB

## 2020-11-21 MED ORDER — LABETALOL HCL 200 MG PO TABS
100.0000 mg | ORAL_TABLET | Freq: Two times a day (BID) | ORAL | Status: DC
  Administered 2020-11-21 – 2020-11-26 (×11): 100 mg via ORAL
  Filled 2020-11-21 (×12): qty 1

## 2020-11-21 MED ORDER — SPIRONOLACTONE 12.5 MG HALF TABLET
12.5000 mg | ORAL_TABLET | Freq: Every day | ORAL | Status: DC
  Administered 2020-11-21 – 2020-11-26 (×6): 12.5 mg via ORAL
  Filled 2020-11-21 (×6): qty 1

## 2020-11-21 NOTE — Plan of Care (Signed)

## 2020-11-21 NOTE — Telephone Encounter (Signed)
Scheduled appts per 8/10 sch msg. Called pt, no answer. Left msg with appts dates and times.

## 2020-11-21 NOTE — Progress Notes (Signed)
Chaplain delivered and explained AD to pt per Spiritual Consult request.  Chaplain engaged in a ministry of presence with the pt as she talked about all the good things God has done in her life, about her abiding faith and love in Him.  She spoke of the importance of having her friend be her 47.  Chaplain prayed at bedside with pt.  Chaplain delivered a Bible per pt's request.  De Burrs Chaplain

## 2020-11-21 NOTE — Progress Notes (Signed)
PROGRESS NOTE    Brenda [name]  SFK:[id number] DOB: August 03, 1960 DOA: 11/19/2020 PCP: Pcp, No    Chief Complaint  Patient presents with   Abdominal Pain   Back Pain    Brief Narrative:   Brenda [name] is a 61 y.o. female with medical history significant of stage IA non-small cell lung cancer status post left lower lobe anterior basilar segmentectomy in 2016 followed by Dr. Julien Nordmann and not on active treatment, CAD, CHF, hypertension, hyperlipidemia, TIA, IBS, PTSD, bipolar disorder, anxiety, depression, chronic back pain presenting to the ED with complaints of abdominal pain, flank pain, nausea, and vomiting.  >He was found to be significantly hypertensive with systolic blood pressure  174, as well her work-up significant for left lower lobe i opacity, infectious versus recurrent malignancy, she was admitted for further management.  .  Assessment & Plan:   Principal Problem:   CAP (community acquired pneumonia) Active Problems:   Hyperlipidemia   Abdominal pain   Hypertensive urgency   QT prolongation   Protein-calorie malnutrition, severe   Community-acquired pneumonia -Presents with significant leukocytosis at 22,000, but no fever or hypoxia . -Will be treated with IV Rocephin, and doxycycline for up pneumonia, avoid azithromycin given prolonged QTC. -CT chest with density adjacent to the suture line previous left lower lobe density, infectious versus disease recurrence. -I have discussed with patient primary oncologist, who will arrange for follow-up in 1 to 16-month after discharge as patient will likely need repeat imaging giving these findings related to infection versus current malignancy, have discussed these findings with the patient, and she is aware about the importance of the follow-up.   Abdominal pain, intractable nausea and vomiting -  Lipase and LFTs normal.  CT showing no acute intra-abdominal finding.  UA with bacteriuria but no significant pyuria and negative  nitrite.   -Improving   Hypertensive urgency -Blood pressure extremely elevated despite receiving multiple doses of IV Eldisine initially, and her medication has been rapidly titrated, which she is currently on Norvasc, hydralazine, with poor control, so I have added labetalol and aldactone today as well.    QT prolongation -Cardiac monitoring.  Keep potassium above 4 and magnesium above 2.  Avoid QT prolonging drugs if possible.  R   Stage IA non-small cell lung cancer Status post left lower lobe anterior basilar segmentectomy in 2016 followed by Dr. Julien Nordmann and not on active treatment. -Please see above discussion, discussed with Dr. Earlie Server, he will arrange for outpatient follow-up when he is treated.   Chronic pain syndrome -Complaining of generalized pain, likely pain is contributing to her uncontrolled blood pressure, I did start on as needed Vicodin and morphine.  CAD -Denies any chest pain, continue with aspirin.  CHF, unspecified chronicity or type Documented in past medical history but not able to see any echo results in the chart.  No signs of volume overload.  PCM -Her BMI is 15, will consult nutritionist.     DVT prophylaxis: Lovenox Code Status: Full Family Communication: none at bedside Disposition:   Status is: Inpatient  Remains inpatient appropriate because:IV treatments appropriate due to intensity of illness or inability to take PO  Dispo: The patient is from: Home              Anticipated d/c is to: Home              Patient currently is not medically stable to d/c.   Difficult to place patient No  Consultants:  D/w oncology by Phone   Subjective:  Does report cough, she reports generalized pain.  Objective: Vitals:   11/21/20 0452 11/21/20 0915 11/21/20 1120 11/21/20 1206  BP: (!) 182/85 (!) 190/83 134/68   Pulse:   70 69  Resp:   18   Temp:   98 F (36.7 C)   TempSrc:   Oral   SpO2:   99% 100%  Weight:      Height:         Intake/Output Summary (Last 24 hours) at 11/21/2020 1400 Last data filed at 11/21/2020 0600 Gross per 24 hour  Intake 820.07 ml  Output --  Net 820.07 ml   Filed Weights   11/20/20 0500  Weight: 40.9 kg    Examination:  Awake Alert, Oriented X 3, ill-appearing, frail.   Symmetrical Chest wall movement, Good air movement bilaterally, CTAB RRR,No Gallops,Rubs or new Murmurs, No Parasternal Heave +ve B.Sounds, Abd Soft, No tenderness, No rebound - guarding or rigidity. No Cyanosis, Clubbing or edema, No new Rash or bruise      Data Reviewed: I have personally reviewed following labs and imaging studies  CBC: Recent Labs  Lab 11/19/20 0843 11/20/20 0336 11/21/20 0249  WBC 22.0* 17.9* 13.4*  HGB 15.7* 13.1 13.8  HCT 47.8* 39.6 42.3  MCV 86.8 85.9 87.2  PLT 444* 326 235    Basic Metabolic Panel: Recent Labs  Lab 11/19/20 0843 11/19/20 1336 11/19/20 2309 11/20/20 0336 11/21/20 0249  NA 138 140  --  134* 133*  K 3.2* 3.5  --  3.8 3.5  CL 96* 103  --  104 101  CO2 25 25  --  22 18*  GLUCOSE 168* 141*  --  133* 166*  BUN 26* 26*  --  20 17  CREATININE 0.74 0.61  --  0.61 0.65  CALCIUM 10.4* 9.3  --  8.7* 9.1  MG  --   --  2.0  --   --     GFR: Estimated Creatinine Clearance: 48.9 mL/min (by C-G formula based on SCr of 0.65 mg/dL).  Liver Function Tests: Recent Labs  Lab 11/19/20 0843 11/19/20 1336  AST 23 17  ALT 12 11  ALKPHOS 109 93  BILITOT 0.7 0.4  PROT 10.0* 8.7*  ALBUMIN 3.8 3.4*    CBG: No results for input(s): GLUCAP in the last 168 hours.   Recent Results (from the past 240 hour(s))  Urine Culture     Status: Abnormal   Collection Time: 11/19/20  8:43 AM   Specimen: Urine, Clean Catch  Result Value Ref Range Status   Specimen Description URINE, CLEAN CATCH  Final   Special Requests   Final    NONE Performed at [facility], [address] [place], [place] [address]    Culture (A)  Final    >=100,000 COLONIES/mL  ESCHERICHIA COLI Confirmed Extended Spectrum Beta-Lactamase Producer (ESBL).  In bloodstream infections from ESBL organisms, carbapenems are preferred over piperacillin/tazobactam. They are shown to have a lower risk of mortality.    Report Status 11/21/2020 FINAL  Final   Organism ID, Bacteria ESCHERICHIA COLI (A)  Final      Susceptibility   Escherichia coli - MIC*    AMPICILLIN >=32 RESISTANT Resistant     CEFAZOLIN >=64 RESISTANT Resistant     CEFEPIME 16 RESISTANT Resistant     CEFTRIAXONE >=64 RESISTANT Resistant     CIPROFLOXACIN >=4 RESISTANT Resistant     GENTAMICIN <=1 SENSITIVE  Sensitive     IMIPENEM <=0.25 SENSITIVE Sensitive     NITROFURANTOIN <=16 SENSITIVE Sensitive     TRIMETH/SULFA <=20 SENSITIVE Sensitive     AMPICILLIN/SULBACTAM >=32 RESISTANT Resistant     PIP/TAZO <=4 SENSITIVE Sensitive     * >=100,000 COLONIES/mL ESCHERICHIA COLI  Resp Panel by RT-PCR (Flu A&B, Covid) Nasopharyngeal Swab     Status: None   Collection Time: 11/19/20  7:24 PM   Specimen: Nasopharyngeal Swab; Nasopharyngeal(NP) swabs in vial transport medium  Result Value Ref Range Status   SARS Coronavirus 2 by RT PCR NEGATIVE NEGATIVE Final    Comment: (NOTE) SARS-CoV-2 target nucleic acids are NOT DETECTED.  The SARS-CoV-2 RNA is generally detectable in upper respiratory specimens during the acute phase of infection. The lowest concentration of SARS-CoV-2 viral copies this assay can detect is 138 copies/mL. A negative result does not preclude SARS-Cov-2 infection and should not be used as the sole basis for treatment or other patient management decisions. A negative result may occur with  improper specimen collection/handling, submission of specimen other than nasopharyngeal swab, presence of viral mutation(s) within the areas targeted by this assay, and inadequate number of viral copies(<138 copies/mL). A negative result must be combined with clinical observations, patient history, and  epidemiological information. The expected result is Negative.  Fact Sheet for Patients:  EntrepreneurPulse.com.au  Fact Sheet for Healthcare Providers:  IncredibleEmployment.be  This test is no t yet approved or cleared by the [place] FDA and  has been authorized for detection and/or diagnosis of SARS-CoV-2 by FDA under an Emergency Use Authorization (EUA). This EUA will remain  in effect (meaning this test can be used) for the duration of the COVID-19 declaration under Section 564(b)(1) of the Act, 21 U.S.C.section 360bbb-3(b)(1), unless the authorization is terminated  or revoked sooner.       Influenza A by PCR NEGATIVE NEGATIVE Final   Influenza B by PCR NEGATIVE NEGATIVE Final    Comment: (NOTE) The Xpert Xpress SARS-CoV-2/FLU/RSV plus assay is intended as an aid in the diagnosis of influenza from Nasopharyngeal swab specimens and should not be used as a sole basis for treatment. Nasal washings and aspirates are unacceptable for Xpert Xpress SARS-CoV-2/FLU/RSV testing.  Fact Sheet for Patients: EntrepreneurPulse.com.au  Fact Sheet for Healthcare Providers: IncredibleEmployment.be  This test is not yet approved or cleared by the [place] FDA and has been authorized for detection and/or diagnosis of SARS-CoV-2 by FDA under an Emergency Use Authorization (EUA). This EUA will remain in effect (meaning this test can be used) for the duration of the COVID-19 declaration under Section 564(b)(1) of the Act, 21 U.S.C. section 360bbb-3(b)(1), unless the authorization is terminated or revoked.  Performed at [facility], [place] [address], [place], [place] [address]   Culture, blood (routine x 2)     Status: None (Preliminary result)   Collection Time: 11/19/20 11:09 PM   Specimen: BLOOD RIGHT HAND  Result Value Ref Range Status   Specimen Description BLOOD RIGHT HAND  Final   Special  Requests AEROBIC BOTTLE ONLY Blood Culture adequate volume  Final   Culture   Final    NO GROWTH 1 DAY Performed at [facility], [place] [address], [place], [place] [address]    Report Status PENDING  Incomplete  Culture, blood (routine x 2)     Status: None (Preliminary result)   Collection Time: 11/19/20 11:09 PM   Specimen: BLOOD  Result Value Ref Range Status   Specimen  Description BLOOD LEFT ANTECUBITAL  Final   Special Requests   Final    BOTTLES DRAWN AEROBIC AND ANAEROBIC Blood Culture adequate volume   Culture   Final    NO GROWTH 1 DAY Performed at [facility], [address] [place], [place] [address]    Report Status PENDING  Incomplete         Radiology Studies: CT CHEST WO CONTRAST  Result Date: 11/19/2020 CLINICAL DATA:  Respiratory failure. Prior radiologic records states non-small cell lung cancer. EXAM: CT CHEST WITHOUT CONTRAST TECHNIQUE: Multidetector CT imaging of the chest was performed following the standard protocol without IV contrast. COMPARISON:  Chest radiograph earlier today. Lung bases from abdominal CT earlier today. Chest CT 08/07/2019 FINDINGS: Cardiovascular: Moderate aortic atherosclerosis. No aortic aneurysm. No periaortic stranding. Heart is normal in size. There are coronary artery calcifications. No pericardial effusion. Mediastinum/Nodes: Limited assessment for adenopathy in the absence of contrast and paucity of mediastinal fat. Few lower paratracheal lymph nodes are not enlarged by size criteria. Limited hilar assessment in the absence of IV contrast. No esophageal wall thickening. Mild thyroid heterogeneity without dominant nodule. Similar appearance of prominent bilateral axillary nodes. Largest on the right measures 9 mm. Largest on the left measures 8 mm. Lungs/Pleura: Left lower lobe suture line again seen. There is increasing adjacent lobulated soft tissue density, adjacent to the suture line. Allowing for same caliper placement  this measures 11 mm, previously 8 mm, however this is extended in the transverse and AP dimension, currently measuring 1.9 x 3.7 cm. There are curvilinear streaky densities extending peripherally to the pleural surface with mild pleural thickening in the lower left hemithorax. Moderate underlying emphysema. Previous paramediastinal triangular soft tissue density in the right middle lobe abutting the heart border has resolved. No new pulmonary nodule or mass. No findings of pulmonary edema. No additional sites of airspace disease. Trachea and central bronchi are patent. Upper Abdomen: Assessed on abdominopelvic CT earlier today. Musculoskeletal: Chronic mild compression fractures of T4 and T7. No acute osseous abnormalities are seen. No focal bone lesion. IMPRESSION: 1. Left lower lobe suture line again seen. There is increasing lobulated soft tissue density adjacent to the suture line since April 2021 exam, currently measuring 1.9 x 3.7 cm. Differential considerations include atypical infection or disease recurrence. Recommend short interval follow-up exam after course of treatment. Follow-up PET-CT or bronchoscopy may be of value. 2. Previous paramediastinal triangular soft tissue density in the right middle lobe abutting the heart border has resolved. 3. Emphysema and aortic atherosclerosis. Coronary artery calcifications. Aortic Atherosclerosis (ICD10-I70.0) and Emphysema (ICD10-J43.9). Electronically Signed   By: Keith Rake M.D.   On: 11/19/2020 22:43   CT ABDOMEN PELVIS W CONTRAST  Result Date: 11/19/2020 CLINICAL DATA:  Diffuse abdominal pain. EXAM: CT ABDOMEN AND PELVIS WITH CONTRAST TECHNIQUE: Multidetector CT imaging of the abdomen and pelvis was performed using the standard protocol following bolus administration of intravenous contrast. CONTRAST:  160mL OMNIPAQUE IOHEXOL 300 MG/ML  SOLN COMPARISON:  03/04/2019 FINDINGS: Lower chest: Increased volume loss and consolidative opacity in the left  lower lobe in this patient status post anterior basal segmentectomy. Some of the increased opacity has a nodular configuration while other areas are more elongated, finger-like projections. Findings may reflect progression of an atypical infectious etiology. Hepatobiliary: No suspicious focal abnormality within the liver parenchyma. Gallbladder is nondistended. No intrahepatic or extrahepatic biliary dilation. Pancreas: No focal mass lesion. No dilatation of the main duct. No intraparenchymal cyst. No peripancreatic edema.  Spleen: No splenomegaly. No focal mass lesion. Adrenals/Urinary Tract: No adrenal nodule or mass. 7 mm low-density lesion lower pole right kidney is too small to characterize but likely benign. 3.1 cm well-defined homogeneous low-density lesion in the upper pole pole left kidney is similar to prior, compatible with a cyst. No evidence for hydroureter. The urinary bladder appears normal for the degree of distention. Stomach/Bowel: Stomach is moderately distended with fluid. Duodenum shows fluid filled distended lumen. No small bowel dilatation. Terminal ileum not discretely evident. The appendix is not well visualized, but there is no edema or inflammation in the region of the cecum. No gross colonic mass. No colonic wall thickening. Vascular/Lymphatic: There is advanced atherosclerotic calcification of the abdominal aorta without aneurysm. There is no gastrohepatic or hepatoduodenal ligament lymphadenopathy. No retroperitoneal or mesenteric lymphadenopathy. No pelvic sidewall lymphadenopathy. Reproductive: Uterus surgically absent. Dominant follicle noted left ovary. No right adnexal mass. Other: No intraperitoneal free fluid. Musculoskeletal: Degenerative changes noted in both hips. Patient is fused across the L3-4 interspace. IMPRESSION: 1. No acute findings in the abdomen or pelvis. Specifically, no findings to explain the patient's history of abdominal pain. 2. Since prior abdomen/pelvis CT  and chest CT of 08/07/2019, increased volume loss and consolidative opacity in the left lower lobe in this patient status post anterior basal segmentectomy. Some of the increased opacity has a nodular configuration while other areas are more elongated, finger-like projections. Findings may reflect progression of an atypical infectious etiology. Dedicated CT chest recommended to further evaluate. 3. Aortic Atherosclerosis (ICD10-I70.0). Electronically Signed   By: Misty Stanley M.D.   On: 11/19/2020 17:42   DG Chest Port 1 View  Result Date: 11/19/2020 CLINICAL DATA:  Cough and leukocytosis EXAM: PORTABLE CHEST 1 VIEW COMPARISON:  03/04/2019 FINDINGS: Cardiac shadow is stable. Aortic calcifications are noted. Skin fold is noted over the right apex simulating pneumothorax although lung markings are noted beyond this density. Similar findings are noted on the left. Lungs are hyperinflated with mild left basilar infiltrate similar to that seen on recent CT examination. No sizable effusion is noted. No bony abnormality is seen. IMPRESSION: Left basilar infiltrate similar to that seen on prior CT. Aortic Atherosclerosis (ICD10-I70.0) and Emphysema (ICD10-J43.9). Electronically Signed   By: Inez Catalina M.D.   On: 11/19/2020 19:07        Scheduled Meds:  amLODipine  10 mg Oral Daily   doxycycline  100 mg Oral Q12H   enoxaparin (LOVENOX) injection  40 mg Subcutaneous Q24H   feeding supplement  237 mL Oral TID BM   hydrALAZINE  50 mg Oral QID   labetalol  100 mg Oral BID   multivitamin with minerals  1 tablet Oral Daily   scopolamine  1 patch Transdermal Q72H   spironolactone  12.5 mg Oral Daily   Continuous Infusions:  cefTRIAXone (ROCEPHIN)  IV 1 g (11/20/20 1744)     LOS: 2 days       Phillips Climes, MD [facility]   To contact the attending provider between 7A-7P or the covering provider during after hours 7P-7A, please log into the web site [email or web address] and access using  universal [place] password for that web site. If you do not have the password, please call the hospital operator.  11/21/2020, 2:00 PM

## 2020-11-22 DIAGNOSIS — N10 Acute tubulo-interstitial nephritis: Secondary | ICD-10-CM

## 2020-11-22 DIAGNOSIS — J189 Pneumonia, unspecified organism: Secondary | ICD-10-CM | POA: Diagnosis not present

## 2020-11-22 LAB — BLOOD CULTURE ID PANEL (REFLEXED) - BCID2

## 2020-11-22 LAB — CBC
HCT: 38.4 % (ref 36.0–46.0)
Hemoglobin: 12.5 g/dL (ref 12.0–15.0)
MCH: 28.7 pg (ref 26.0–34.0)
MCHC: 32.6 g/dL (ref 30.0–36.0)
MCV: 88.3 fL (ref 80.0–100.0)
Platelets: 291 10*3/uL (ref 150–400)
RBC: 4.35 MIL/uL (ref 3.87–5.11)
RDW: 15 % (ref 11.5–15.5)
WBC: 10.8 10*3/uL — ABNORMAL HIGH (ref 4.0–10.5)
nRBC: 0 % (ref 0.0–0.2)

## 2020-11-22 LAB — BASIC METABOLIC PANEL
Anion gap: 6 (ref 5–15)
BUN: 19 mg/dL (ref 6–20)
CO2: 23 mmol/L (ref 22–32)
Calcium: 8.7 mg/dL — ABNORMAL LOW (ref 8.9–10.3)
Chloride: 104 mmol/L (ref 98–111)
Creatinine, Ser: 0.68 mg/dL (ref 0.44–1.00)
GFR, Estimated: >60 mL/min (ref >60)
Glucose, Bld: 99 mg/dL (ref 70–99)
Potassium: 4.1 mmol/L (ref 3.5–5.1)
Sodium: 133 mmol/L — ABNORMAL LOW (ref 135–145)

## 2020-11-22 MED ORDER — NICOTINE 14 MG/24HR TD PT24
14.0000 mg | MEDICATED_PATCH | Freq: Every day | TRANSDERMAL | Status: DC
  Administered 2020-11-22 – 2020-11-26 (×5): 14 mg via TRANSDERMAL
  Filled 2020-11-22 (×5): qty 1

## 2020-11-22 MED ORDER — SODIUM CHLORIDE 0.9 % IV SOLN
1.0000 g | Freq: Two times a day (BID) | INTRAVENOUS | Status: DC
  Administered 2020-11-22 – 2020-11-26 (×10): 1 g via INTRAVENOUS
  Filled 2020-11-22 (×10): qty 1

## 2020-11-22 NOTE — Plan of Care (Signed)

## 2020-11-22 NOTE — Progress Notes (Addendum)
PROGRESS NOTE    Brenda [name]  KAJ:[id number] DOB: 09-28-1960 DOA: 11/19/2020 PCP: Pcp, No    Chief Complaint  Patient presents with   Abdominal Pain   Back Pain    Brief Narrative:   Brenda [name] is a 60 y.o. female with medical history significant of stage IA non-small cell lung cancer status post left lower lobe anterior basilar segmentectomy in 2016 followed by Dr. Julien Nordmann and not on active treatment, CAD, CHF, hypertension, hyperlipidemia, TIA, IBS, PTSD, bipolar disorder, anxiety, depression, chronic back pain presenting to the ED with complaints of abdominal pain, flank pain, nausea, and vomiting.  Initial work-up was significant for up pneumonia, he was sent, urine culture was sent, it is growing ESBL, she does endorse bilateral flank pain and has CVA tenderness, so antibiotic has been adjusted to treat for pyelonephritis.  Assessment & Plan:   Principal Problem:   CAP (community acquired pneumonia) Active Problems:   Hyperlipidemia   Abdominal pain   Hypertensive urgency   QT prolongation   Protein-calorie malnutrition, severe  ESBL bilateral pyelonephritis -Patient presents initially with abdominal pain/flank pain/intractable nausea or vomiting, her urine ESBL,  - ESBL, she does endorse dysuria upon presentation, does have bilateral CVA tenderness -So clinical picture fits ESBL-itis, started antibiotic have been changed to imipenem  Community-acquired pneumonia -Presents with significant leukocytosis at 22,000, but no fever or hypoxia . -Will be treated with IV Rocephin, and doxycycline for up pneumonia, avoid azithromycin given prolonged QTC. -CT chest with density adjacent to the suture line previous left lower lobe density, infectious versus disease recurrence. -I have discussed with patient primary oncologist, who will arrange for follow-up in 1 to 65-month after discharge as patient will likely need repeat imaging giving these findings related to infection versus  current malignancy, have discussed these findings with the patient, and she is aware about the importance of the follow-up.   Abdominal pain, intractable nausea and vomiting -  Lipase and LFTs normal.  CT showing no acute intra-abdominal finding.   - above findings most likely related to pyelonephritis.  - -Improving  1/3 positive blood culture for bacillus -This is most likely contamination, no indication to treat.   Hypertensive urgency -Pressure extremities upon presentation, she was started on multiple medications to achieve good control, she is currently on Norvasc, hydralazine, labetalol and Aldactone.     QT prolongation -Cardiac monitoring.  Keep potassium above 4 and magnesium above 2.   -Patient with 15 beats of SVT today, will repeat EKG to confirm QTC, and will check magnesium level as well, potassium is 4.1   Stage IA non-small cell lung cancer Status post left lower lobe anterior basilar segmentectomy in 2016 followed by Dr. Julien Nordmann and not on active treatment. -Please see above discussion, discussed with Dr. Earlie Server, he will arrange for outpatient follow-up when he is treated.   Chronic pain syndrome -Complaining of generalized pain, likely pain is contributing to her uncontrolled blood pressure, I did start on as needed Vicodin and morphine.  CAD -Denies any chest pain, continue with aspirin.  CHF, unspecified chronicity or type Documented in past medical history but not able to see any echo results in the chart.  No signs of volume overload.  PCM -Her BMI is 15, will consult nutritionist.     DVT prophylaxis: Lovenox Code Status: Full Family Communication: none at bedside Disposition:   Status is: Inpatient  Remains inpatient appropriate because:IV treatments appropriate due to intensity of illness or inability to take PO  Dispo: The patient is from: Home              Anticipated d/c is to: Home              Patient currently is not medically stable to  d/c.   Difficult to place patient No       Consultants:  D/w oncology by Phone   Subjective:  Reports she is feeling better, no nausea, no vomiting, still complains of bilateral flank pains  Objective: Vitals:   11/21/20 1800 11/21/20 2046 11/21/20 2115 11/22/20 0253  BP: 130/74 (!) 149/75 (!) 149/75 (!) 148/72  Pulse: 76  74 61  Resp:  20  18  Temp:  98.6 F (37 C)  98.4 F (36.9 C)  TempSrc:    Oral  SpO2: 100%   99%  Weight:      Height:        Intake/Output Summary (Last 24 hours) at 11/22/2020 1531 Last data filed at 11/22/2020 1135 Gross per 24 hour  Intake 440 ml  Output --  Net 440 ml   Filed Weights   11/20/20 0500  Weight: 40.9 kg    Examination:  Awake Alert, Oriented X 3,, extremely thin appearing  symmetrical Chest wall movement, Good air movement bilaterally, CTAB RRR,No Gallops,Rubs or new Murmurs, No Parasternal Heave +ve B.Sounds, Abd Soft, bilateral CVA tenderness. No Cyanosis, Clubbing or edema, No new Rash or bruise      Data Reviewed: I have personally reviewed following labs and imaging studies  CBC: Recent Labs  Lab 11/19/20 0843 11/20/20 0336 11/21/20 0249 11/22/20 0239  WBC 22.0* 17.9* 13.4* 10.8*  HGB 15.7* 13.1 13.8 12.5  HCT 47.8* 39.6 42.3 38.4  MCV 86.8 85.9 87.2 88.3  PLT 444* 326 301 629    Basic Metabolic Panel: Recent Labs  Lab 11/19/20 0843 11/19/20 1336 11/19/20 2309 11/20/20 0336 11/21/20 0249 11/22/20 0239  NA 138 140  --  134* 133* 133*  K 3.2* 3.5  --  3.8 3.5 4.1  CL 96* 103  --  104 101 104  CO2 25 25  --  22 18* 23  GLUCOSE 168* 141*  --  133* 166* 99  BUN 26* 26*  --  20 17 19   CREATININE 0.74 0.61  --  0.61 0.65 0.68  CALCIUM 10.4* 9.3  --  8.7* 9.1 8.7*  MG  --   --  2.0  --   --   --     GFR: Estimated Creatinine Clearance: 48.9 mL/min (by C-G formula based on SCr of 0.68 mg/dL).  Liver Function Tests: Recent Labs  Lab 11/19/20 0843 11/19/20 1336  AST 23 17  ALT 12 11   ALKPHOS 109 93  BILITOT 0.7 0.4  PROT 10.0* 8.7*  ALBUMIN 3.8 3.4*    CBG: No results for input(s): GLUCAP in the last 168 hours.   Recent Results (from the past 240 hour(s))  Urine Culture     Status: Abnormal   Collection Time: 11/19/20  8:43 AM   Specimen: Urine, Clean Catch  Result Value Ref Range Status   Specimen Description URINE, CLEAN CATCH  Final   Special Requests   Final    NONE Performed at [facility], [address] [place], [place] [address]    Culture (A)  Final    >=100,000 COLONIES/mL ESCHERICHIA COLI Confirmed Extended Spectrum Beta-Lactamase Producer (ESBL).  In bloodstream infections from ESBL organisms, carbapenems are preferred over piperacillin/tazobactam. They are  shown to have a lower risk of mortality.    Report Status 11/21/2020 FINAL  Final   Organism ID, Bacteria ESCHERICHIA COLI (A)  Final      Susceptibility   Escherichia coli - MIC*    AMPICILLIN >=32 RESISTANT Resistant     CEFAZOLIN >=64 RESISTANT Resistant     CEFEPIME 16 RESISTANT Resistant     CEFTRIAXONE >=64 RESISTANT Resistant     CIPROFLOXACIN >=4 RESISTANT Resistant     GENTAMICIN <=1 SENSITIVE Sensitive     IMIPENEM <=0.25 SENSITIVE Sensitive     NITROFURANTOIN <=16 SENSITIVE Sensitive     TRIMETH/SULFA <=20 SENSITIVE Sensitive     AMPICILLIN/SULBACTAM >=32 RESISTANT Resistant     PIP/TAZO <=4 SENSITIVE Sensitive     * >=100,000 COLONIES/mL ESCHERICHIA COLI  Resp Panel by RT-PCR (Flu A&B, Covid) Nasopharyngeal Swab     Status: None   Collection Time: 11/19/20  7:24 PM   Specimen: Nasopharyngeal Swab; Nasopharyngeal(NP) swabs in vial transport medium  Result Value Ref Range Status   SARS Coronavirus 2 by RT PCR NEGATIVE NEGATIVE Final    Comment: (NOTE) SARS-CoV-2 target nucleic acids are NOT DETECTED.  The SARS-CoV-2 RNA is generally detectable in upper respiratory specimens during the acute phase of infection. The lowest concentration of SARS-CoV-2 viral copies  this assay can detect is 138 copies/mL. A negative result does not preclude SARS-Cov-2 infection and should not be used as the sole basis for treatment or other patient management decisions. A negative result may occur with  improper specimen collection/handling, submission of specimen other than nasopharyngeal swab, presence of viral mutation(s) within the areas targeted by this assay, and inadequate number of viral copies(<138 copies/mL). A negative result must be combined with clinical observations, patient history, and epidemiological information. The expected result is Negative.  Fact Sheet for Patients:  EntrepreneurPulse.com.au  Fact Sheet for Healthcare Providers:  IncredibleEmployment.be  This test is no t yet approved or cleared by the [place] FDA and  has been authorized for detection and/or diagnosis of SARS-CoV-2 by FDA under an Emergency Use Authorization (EUA). This EUA will remain  in effect (meaning this test can be used) for the duration of the COVID-19 declaration under Section 564(b)(1) of the Act, 21 U.S.C.section 360bbb-3(b)(1), unless the authorization is terminated  or revoked sooner.       Influenza A by PCR NEGATIVE NEGATIVE Final   Influenza B by PCR NEGATIVE NEGATIVE Final    Comment: (NOTE) The Xpert Xpress SARS-CoV-2/FLU/RSV plus assay is intended as an aid in the diagnosis of influenza from Nasopharyngeal swab specimens and should not be used as a sole basis for treatment. Nasal washings and aspirates are unacceptable for Xpert Xpress SARS-CoV-2/FLU/RSV testing.  Fact Sheet for Patients: EntrepreneurPulse.com.au  Fact Sheet for Healthcare Providers: IncredibleEmployment.be  This test is not yet approved or cleared by the [place] FDA and has been authorized for detection and/or diagnosis of SARS-CoV-2 by FDA under an Emergency Use Authorization (EUA). This EUA  will remain in effect (meaning this test can be used) for the duration of the COVID-19 declaration under Section 564(b)(1) of the Act, 21 U.S.C. section 360bbb-3(b)(1), unless the authorization is terminated or revoked.  Performed at [facility], [place] [address], [place], [place] [address]   Culture, blood (routine x 2)     Status: None (Preliminary result)   Collection Time: 11/19/20 11:09 PM   Specimen: BLOOD RIGHT HAND  Result Value Ref Range Status   Specimen Description BLOOD  RIGHT HAND  Final   Special Requests AEROBIC BOTTLE ONLY Blood Culture adequate volume  Final   Culture   Final    NO GROWTH 2 DAYS Performed at [facility], [address], [place], [place] [address]    Report Status PENDING  Incomplete  Culture, blood (routine x 2)     Status: Abnormal (Preliminary result)   Collection Time: 11/19/20 11:09 PM   Specimen: BLOOD  Result Value Ref Range Status   Specimen Description BLOOD LEFT ANTECUBITAL  Final   Special Requests   Final    BOTTLES DRAWN AEROBIC AND ANAEROBIC Blood Culture adequate volume   Culture  Setup Time (A)  Final    GRAM VARIABLE ROD ANAEROBIC BOTTLE ONLY CRITICAL RESULT CALLED TO, READ BACK BY AND VERIFIED WITH: J,LEDFORD PHARMD @0033  11/22/20 EB    Culture (A)  Final    BACILLUS SPECIES Standardized susceptibility testing for this organism is not available. Performed at [facility], [place] [address], [place], [place] [address]    Report Status PENDING  Incomplete  Blood Culture ID Panel (Reflexed)     Status: None   Collection Time: 11/19/20 11:09 PM  Result Value Ref Range Status   Enterococcus faecalis NOT DETECTED NOT DETECTED Final   Enterococcus Faecium NOT DETECTED NOT DETECTED Final   Listeria monocytogenes NOT DETECTED NOT DETECTED Final   Staphylococcus species NOT DETECTED NOT DETECTED Final   Staphylococcus aureus (BCID) NOT DETECTED NOT DETECTED Final   Staphylococcus epidermidis NOT DETECTED NOT DETECTED  Final   Staphylococcus lugdunensis NOT DETECTED NOT DETECTED Final   Streptococcus species NOT DETECTED NOT DETECTED Final   Streptococcus agalactiae NOT DETECTED NOT DETECTED Final   Streptococcus pneumoniae NOT DETECTED NOT DETECTED Final   Streptococcus pyogenes NOT DETECTED NOT DETECTED Final   A.calcoaceticus-baumannii NOT DETECTED NOT DETECTED Final   Bacteroides fragilis NOT DETECTED NOT DETECTED Final   Enterobacterales NOT DETECTED NOT DETECTED Final   Enterobacter cloacae complex NOT DETECTED NOT DETECTED Final   Escherichia coli NOT DETECTED NOT DETECTED Final   Klebsiella aerogenes NOT DETECTED NOT DETECTED Final   Klebsiella oxytoca NOT DETECTED NOT DETECTED Final   Klebsiella pneumoniae NOT DETECTED NOT DETECTED Final   Proteus species NOT DETECTED NOT DETECTED Final   Salmonella species NOT DETECTED NOT DETECTED Final   Serratia marcescens NOT DETECTED NOT DETECTED Final   Haemophilus influenzae NOT DETECTED NOT DETECTED Final   Neisseria meningitidis NOT DETECTED NOT DETECTED Final   Pseudomonas aeruginosa NOT DETECTED NOT DETECTED Final   Stenotrophomonas maltophilia NOT DETECTED NOT DETECTED Final   Candida albicans NOT DETECTED NOT DETECTED Final   Candida auris NOT DETECTED NOT DETECTED Final   Candida glabrata NOT DETECTED NOT DETECTED Final   Candida krusei NOT DETECTED NOT DETECTED Final   Candida parapsilosis NOT DETECTED NOT DETECTED Final   Candida tropicalis NOT DETECTED NOT DETECTED Final   Cryptococcus neoformans/gattii NOT DETECTED NOT DETECTED Final    Comment: Performed at [facility], [address] [place], [place] [address]         Radiology Studies: No results found.      Scheduled Meds:  amLODipine  10 mg Oral Daily   doxycycline  100 mg Oral Q12H   enoxaparin (LOVENOX) injection  40 mg Subcutaneous Q24H   feeding supplement  237 mL Oral TID BM   hydrALAZINE  50 mg Oral QID   labetalol  100 mg Oral BID   multivitamin with  minerals  1 tablet Oral Daily   scopolamine  1 patch Transdermal Q72H   spironolactone  12.5 mg Oral Daily   Continuous Infusions:  meropenem (MERREM) IV 1 g (11/22/20 0933)     LOS: 3 days       Phillips Climes, MD [facility]   To contact the attending provider between 7A-7P or the covering provider during after hours 7P-7A, please log into the web site [email or web address] and access using universal Echo password for that web site. If you do not have the password, please call the hospital operator.  11/22/2020, 3:31 PM

## 2020-11-22 NOTE — Progress Notes (Signed)
PHARMACY - PHYSICIAN COMMUNICATION CRITICAL VALUE ALERT - BLOOD CULTURE IDENTIFICATION (BCID)  Brenda [name] is an 60 y.o. female who presented to [facility] on 11/19/2020 with a chief complaint of flank pain/abdominal pain  8/9 Blood: 1/3 Gram variable rods, no organism on BCID  8/9 Urine: ESBL E Coli  Assessment: WBC elevated but trending down  Name of physician (or Provider) Contacted: Dr. Nevada Crane  Current antibiotics:Ceftriaxone/Doxycycline  Changes to prescribed antibiotics recommended:  -Will DC Ceftriaxone -Will start Merrem 1g IV q12h -Cont Doxycyline for atypical PNA coverage  Results for orders placed or performed during the hospital encounter of 11/19/20  Blood Culture ID Panel (Reflexed) (Collected: 11/19/2020 11:09 PM)  Result Value Ref Range   Enterococcus faecalis NOT DETECTED NOT DETECTED   Enterococcus Faecium NOT DETECTED NOT DETECTED   Listeria monocytogenes NOT DETECTED NOT DETECTED   Staphylococcus species NOT DETECTED NOT DETECTED   Staphylococcus aureus (BCID) NOT DETECTED NOT DETECTED   Staphylococcus epidermidis NOT DETECTED NOT DETECTED   Staphylococcus lugdunensis NOT DETECTED NOT DETECTED   Streptococcus species NOT DETECTED NOT DETECTED   Streptococcus agalactiae NOT DETECTED NOT DETECTED   Streptococcus pneumoniae NOT DETECTED NOT DETECTED   Streptococcus pyogenes NOT DETECTED NOT DETECTED   A.calcoaceticus-baumannii NOT DETECTED NOT DETECTED   Bacteroides fragilis NOT DETECTED NOT DETECTED   Enterobacterales NOT DETECTED NOT DETECTED   Enterobacter cloacae complex NOT DETECTED NOT DETECTED   Escherichia coli NOT DETECTED NOT DETECTED   Klebsiella aerogenes NOT DETECTED NOT DETECTED   Klebsiella oxytoca NOT DETECTED NOT DETECTED   Klebsiella pneumoniae NOT DETECTED NOT DETECTED   Proteus species NOT DETECTED NOT DETECTED   Salmonella species NOT DETECTED NOT DETECTED   Serratia marcescens NOT DETECTED NOT DETECTED   Haemophilus influenzae NOT  DETECTED NOT DETECTED   Neisseria meningitidis NOT DETECTED NOT DETECTED   Pseudomonas aeruginosa NOT DETECTED NOT DETECTED   Stenotrophomonas maltophilia NOT DETECTED NOT DETECTED   Candida albicans NOT DETECTED NOT DETECTED   Candida auris NOT DETECTED NOT DETECTED   Candida glabrata NOT DETECTED NOT DETECTED   Candida krusei NOT DETECTED NOT DETECTED   Candida parapsilosis NOT DETECTED NOT DETECTED   Candida tropicalis NOT DETECTED NOT DETECTED   Cryptococcus neoformans/gattii NOT DETECTED NOT DETECTED    Narda Bonds, PharmD, BCPS Clinical Pharmacist Phone: [phone]

## 2020-11-22 NOTE — Progress Notes (Addendum)
PHARMACY - PHYSICIAN COMMUNICATION CRITICAL VALUE ALERT - BLOOD CULTURE IDENTIFICATION (BCID)  Brenda [name] is an 60 y.o. female who presented to [facility] on 11/19/2020 with a chief complaint of flank pain/abdominal pain  8/9 Blood: 1/3 Gram variable rods, no organism on BCID 8/9 Urine: ESBL E Coli  Assessment: WBC elevated but trending down  Name of physician (or Provider) Contacted: Dr. Nevada Crane  Current antibiotics:Ceftriaxone/Doxycycline  Changes to prescribed antibiotics recommended:  -Will DC Ceftriaxone -Will start Merrem 1g IV q12h -Cont Doxycyline for atypical PNA coverage  Results for orders placed or performed during the hospital encounter of 11/19/20  Blood Culture ID Panel (Reflexed) (Collected: 11/19/2020 11:09 PM)  Result Value Ref Range   Enterococcus faecalis NOT DETECTED NOT DETECTED   Enterococcus Faecium NOT DETECTED NOT DETECTED   Listeria monocytogenes NOT DETECTED NOT DETECTED   Staphylococcus species NOT DETECTED NOT DETECTED   Staphylococcus aureus (BCID) NOT DETECTED NOT DETECTED   Staphylococcus epidermidis NOT DETECTED NOT DETECTED   Staphylococcus lugdunensis NOT DETECTED NOT DETECTED   Streptococcus species NOT DETECTED NOT DETECTED   Streptococcus agalactiae NOT DETECTED NOT DETECTED   Streptococcus pneumoniae NOT DETECTED NOT DETECTED   Streptococcus pyogenes NOT DETECTED NOT DETECTED   A.calcoaceticus-baumannii NOT DETECTED NOT DETECTED   Bacteroides fragilis NOT DETECTED NOT DETECTED   Enterobacterales NOT DETECTED NOT DETECTED   Enterobacter cloacae complex NOT DETECTED NOT DETECTED   Escherichia coli NOT DETECTED NOT DETECTED   Klebsiella aerogenes NOT DETECTED NOT DETECTED   Klebsiella oxytoca NOT DETECTED NOT DETECTED   Klebsiella pneumoniae NOT DETECTED NOT DETECTED   Proteus species NOT DETECTED NOT DETECTED   Salmonella species NOT DETECTED NOT DETECTED   Serratia marcescens NOT DETECTED NOT DETECTED   Haemophilus influenzae NOT  DETECTED NOT DETECTED   Neisseria meningitidis NOT DETECTED NOT DETECTED   Pseudomonas aeruginosa NOT DETECTED NOT DETECTED   Stenotrophomonas maltophilia NOT DETECTED NOT DETECTED   Candida albicans NOT DETECTED NOT DETECTED   Candida auris NOT DETECTED NOT DETECTED   Candida glabrata NOT DETECTED NOT DETECTED   Candida krusei NOT DETECTED NOT DETECTED   Candida parapsilosis NOT DETECTED NOT DETECTED   Candida tropicalis NOT DETECTED NOT DETECTED   Cryptococcus neoformans/gattii NOT DETECTED NOT DETECTED    Narda Bonds, PharmD, BCPS Clinical Pharmacist Phone: [phone]

## 2020-11-22 NOTE — Progress Notes (Signed)
Pt had 15 beat run of SVT. Went back into SR. Pt is stable. Will continue to monitor. MD aware. No new orders.

## 2020-11-23 DIAGNOSIS — E43 Unspecified severe protein-calorie malnutrition: Secondary | ICD-10-CM | POA: Diagnosis not present

## 2020-11-23 LAB — CBC
HCT: 39.1 % (ref 36.0–46.0)
Hemoglobin: 12.5 g/dL (ref 12.0–15.0)
MCH: 28.5 pg (ref 26.0–34.0)
MCHC: 32 g/dL (ref 30.0–36.0)
MCV: 89.3 fL (ref 80.0–100.0)
Platelets: 287 10*3/uL (ref 150–400)
RBC: 4.38 MIL/uL (ref 3.87–5.11)
RDW: 15 % (ref 11.5–15.5)
WBC: 10.4 10*3/uL (ref 4.0–10.5)
nRBC: 0 % (ref 0.0–0.2)

## 2020-11-23 LAB — BASIC METABOLIC PANEL
Anion gap: 7 (ref 5–15)
BUN: 19 mg/dL (ref 6–20)
CO2: 23 mmol/L (ref 22–32)
Calcium: 9.1 mg/dL (ref 8.9–10.3)
Chloride: 102 mmol/L (ref 98–111)
Creatinine, Ser: 0.58 mg/dL (ref 0.44–1.00)
GFR, Estimated: >60 mL/min (ref >60)
Glucose, Bld: 100 mg/dL — ABNORMAL HIGH (ref 70–99)
Potassium: 4.6 mmol/L (ref 3.5–5.1)
Sodium: 132 mmol/L — ABNORMAL LOW (ref 135–145)

## 2020-11-23 LAB — CULTURE, BLOOD (ROUTINE X 2): Special Requests: ADEQUATE

## 2020-11-23 MED ORDER — SODIUM CHLORIDE 0.9 % IV SOLN
INTRAVENOUS | Status: DC | PRN
  Administered 2020-11-24: 250 mL via INTRAVENOUS

## 2020-11-23 MED ORDER — ZOLPIDEM TARTRATE 5 MG PO TABS
5.0000 mg | ORAL_TABLET | Freq: Every evening | ORAL | Status: DC | PRN
  Administered 2020-11-23 – 2020-11-25 (×3): 5 mg via ORAL
  Filled 2020-11-23 (×3): qty 1
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
## 2020-11-24 DIAGNOSIS — J189 Pneumonia, unspecified organism: Secondary | ICD-10-CM | POA: Diagnosis not present

## 2020-11-24 DIAGNOSIS — N12 Tubulo-interstitial nephritis, not specified as acute or chronic: Secondary | ICD-10-CM | POA: Diagnosis not present

## 2020-11-24 DIAGNOSIS — I160 Hypertensive urgency: Secondary | ICD-10-CM | POA: Diagnosis not present

## 2020-11-24 MED ORDER — HYDRALAZINE HCL 50 MG PO TABS
50.0000 mg | ORAL_TABLET | Freq: Three times a day (TID) | ORAL | Status: DC
  Administered 2020-11-24 – 2020-11-26 (×6): 50 mg via ORAL
  Filled 2020-11-24 (×6): qty 1
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
## 2020-11-25 DIAGNOSIS — N10 Acute tubulo-interstitial nephritis: Secondary | ICD-10-CM | POA: Diagnosis not present

## 2020-11-25 DIAGNOSIS — J189 Pneumonia, unspecified organism: Secondary | ICD-10-CM | POA: Diagnosis not present

## 2020-11-25 LAB — CULTURE, BLOOD (ROUTINE X 2)
Culture: NO GROWTH
Special Requests: ADEQUATE

## 2020-11-25 MED ORDER — KETOROLAC TROMETHAMINE 15 MG/ML IJ SOLN
15.0000 mg | Freq: Once | INTRAMUSCULAR | Status: AC
  Administered 2020-11-25: 15 mg via INTRAVENOUS
  Filled 2020-11-25: qty 1

## 2020-11-25 MED ORDER — HYDROCODONE-ACETAMINOPHEN 10-325 MG PO TABS
1.0000 | ORAL_TABLET | Freq: Three times a day (TID) | ORAL | Status: DC | PRN
  Administered 2020-11-25 – 2020-11-26 (×3): 1 via ORAL
  Filled 2020-11-25 (×3): qty 1

## 2020-11-25 MED ORDER — MORPHINE SULFATE (PF) 2 MG/ML IV SOLN
1.0000 mg | Freq: Three times a day (TID) | INTRAVENOUS | Status: DC | PRN
  Administered 2020-11-25 – 2020-11-26 (×3): 2 mg via INTRAVENOUS
  Filled 2020-11-25 (×3): qty 1
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
## 2020-11-26 DIAGNOSIS — I10 Essential (primary) hypertension: Secondary | ICD-10-CM

## 2020-11-26 DIAGNOSIS — E43 Unspecified severe protein-calorie malnutrition: Secondary | ICD-10-CM | POA: Diagnosis not present

## 2020-11-26 DIAGNOSIS — J189 Pneumonia, unspecified organism: Secondary | ICD-10-CM | POA: Diagnosis not present

## 2020-11-26 MED ORDER — SPIRONOLACTONE 25 MG PO TABS: 12.5000 mg | ORAL_TABLET | Freq: Every day | ORAL | 0 refills | Status: DC

## 2020-11-26 MED ORDER — LABETALOL HCL 100 MG PO TABS: 100.0000 mg | ORAL_TABLET | Freq: Two times a day (BID) | ORAL | 0 refills | Status: DC

## 2020-11-26 MED ORDER — HYDROCODONE-ACETAMINOPHEN 10-325 MG PO TABS: 1.0000 | ORAL_TABLET | Freq: Three times a day (TID) | ORAL | 0 refills | Status: DC | PRN

## 2020-11-26 MED ORDER — ASPIRIN EC 81 MG PO TBEC: 81.0000 mg | DELAYED_RELEASE_TABLET | Freq: Every day | ORAL | 0 refills | Status: AC

## 2020-11-26 MED ORDER — ADULT MULTIVITAMIN W/MINERALS CH: 1.0000 | ORAL_TABLET | Freq: Every day | ORAL | 1 refills | Status: AC

## 2020-11-26 MED ORDER — HYDRALAZINE HCL 50 MG PO TABS
50.0000 mg | ORAL_TABLET | Freq: Three times a day (TID) | ORAL | 1 refills | Status: DC
Start: 2020-11-26 — End: 2021-05-20

## 2020-11-26 MED ORDER — ENSURE ENLIVE PO LIQD: 237.0000 mL | Freq: Three times a day (TID) | ORAL | 12 refills | Status: AC

## 2020-11-26 MED ORDER — AMLODIPINE BESYLATE 10 MG PO TABS: 10.0000 mg | ORAL_TABLET | Freq: Every day | ORAL | 1 refills | Status: AC

## 2020-11-26 NOTE — Discharge Summary (Signed)
Physician Discharge Summary  Brenda [name]:[id number] DOB: [birth year] DOA: 11/19/2020  PCP: Merryl Hacker, No  Admit date: 11/19/2020 Discharge date: 11/26/2020  Admitted From: Home Disposition:  Home   Recommendations for Outpatient Follow-up:  Follow up with PCP in 1-2 weeks Please obtain BMP/CBC in one week Patient need to follow with her primary oncologist Dr. Earlie Server, who will arrange for outpatient follow-up regarding possible recurrence of malignancy in left lower lobe, patient aware of these findings and the importance of follow-up.   Home Health:NO Equipment/Devices:none  Discharge Condition:Stable CODE STATUS:FULL Diet recommendation: Heart Healthy  Brief/Interim Summary:    Discharge Diag  ESBL acute cystitis with possible bilateral pyelonephritis: Presenting with abdominal pain/back-flank pain intractable nausea vomiting, urine culture with ESBL and dysuria symptoms.  She was initially on Rocephin, but when the culture came back significant for ESBL, she was transitioned to meropenem, finish total of 5 days,  Leukocytosis has resolved, afebrile   her  Lipase LFTs are normal CT did not show any acute intra-abdominal findings. She does have chronic back pain.     Community-acquired pneumonia:  CT chest showed density adjacent to the suture line previous left lower lobe density versus versus discharge I have discussed with Dr. Earlie Server her primary oncologist will arrange for follow-up in 1 to 2 months discharge as it will need repeat imaging to make sure there is no recurrence of cancer.  He was treated with total of 5 days of antibiotics.     1/3 positive blood culture for bacillus likely contamination   HTN urgency on presentation.  He was not taking any home medications, started on Norvasc, hydralazine, Aldactone with good blood pressure control.      History of stage Ia non-small cell lung cancer followed by Dr. Julien Nordmann - plan to have outpatient follow-up upon discharge    QT prolongation:Monitor electrolytes.  Has had SVT, cont her beta-blockers   Mild hyponatremia- asymptomatic   Protein-calorie malnutrition, severe: Continue augment nutritional status as below with Ensure        Mild dehydration  Abdominal pain, unspecified abdominal location  Nausea and vomiting, intractability of vomiting not specified, unspecified vomiting type  Essential hypertension - Plan: aspirin EC 81 MG tablet  Principal Problem:   CAP (community acquired pneumonia) Active Problems:   Hyperlipidemia   Abdominal pain   Hypertensive urgency   QT prolongation   Protein-calorie malnutrition, severe    Discharge Instructions  Discharge Instructions     Diet - low sodium heart healthy   Complete by: As directed    Increase activity slowly   Complete by: As directed       Allergies as of 11/26/2020   No Known Allergies      Medication List     STOP taking these medications    diazepam 2 MG tablet Commonly known as: VALIUM   nitroGLYCERIN 0.4 MG SL tablet Commonly known as: NITROSTAT   senna 8.6 MG Tabs tablet Commonly known as: SENOKOT       TAKE these medications    amLODipine 10 MG tablet Commonly known as: NORVASC Take 1 tablet (10 mg total) by mouth daily. Start taking on: November 27, 2020   aspirin EC 81 MG tablet Take 1 tablet (81 mg total) by mouth daily.   feeding supplement Liqd Take 237 mLs by mouth 3 (three) times daily between meals.   hydrALAZINE 50 MG tablet Commonly known as: APRESOLINE Take 1 tablet (50 mg total) by mouth every 8 (eight) hours.  HYDROcodone-acetaminophen 10-325 MG tablet Commonly known as: NORCO Take 1 tablet by mouth every 8 (eight) hours as needed for severe pain.   labetalol 100 MG tablet Commonly known as: NORMODYNE Take 1 tablet (100 mg total) by mouth 2 (two) times daily.   multivitamin with minerals Tabs tablet Take 1 tablet by mouth daily. Start taking on: November 27, 2020    spironolactone 25 MG tablet Commonly known as: ALDACTONE Take 0.5 tablets (12.5 mg total) by mouth daily. Start taking on: November 27, 2020        Follow-up Information     Nolene Ebbs, MD. Go to.   Specialty: Internal Medicine Why: follow up with PCP Contact information: [place] [address] [phone]                No Known Allergies  Consultations: None Discussed  with her primary oncologist Dr. Earlie Server via phone   Procedures/Studies: CT CHEST WO CONTRAST  Result Date: 11/19/2020 CLINICAL DATA:  Respiratory failure. Prior radiologic records states non-small cell lung cancer. EXAM: CT CHEST WITHOUT CONTRAST TECHNIQUE: Multidetector CT imaging of the chest was performed following the standard protocol without IV contrast. COMPARISON:  Chest radiograph earlier today. Lung bases from abdominal CT earlier today. Chest CT 08/07/2019 FINDINGS: Cardiovascular: Moderate aortic atherosclerosis. No aortic aneurysm. No periaortic stranding. Heart is normal in size. There are coronary artery calcifications. No pericardial effusion. Mediastinum/Nodes: Limited assessment for adenopathy in the absence of contrast and paucity of mediastinal fat. Few lower paratracheal lymph nodes are not enlarged by size criteria. Limited hilar assessment in the absence of IV contrast. No esophageal wall thickening. Mild thyroid heterogeneity without dominant nodule. Similar appearance of prominent bilateral axillary nodes. Largest on the right measures 9 mm. Largest on the left measures 8 mm. Lungs/Pleura: Left lower lobe suture line again seen. There is increasing adjacent lobulated soft tissue density, adjacent to the suture line. Allowing for same caliper placement this measures 11 mm, previously 8 mm, however this is extended in the transverse and AP dimension, currently measuring 1.9 x 3.7 cm. There are curvilinear streaky densities extending peripherally to the pleural surface  with mild pleural thickening in the lower left hemithorax. Moderate underlying emphysema. Previous paramediastinal triangular soft tissue density in the right middle lobe abutting the heart border has resolved. No new pulmonary nodule or mass. No findings of pulmonary edema. No additional sites of airspace disease. Trachea and central bronchi are patent. Upper Abdomen: Assessed on abdominopelvic CT earlier today. Musculoskeletal: Chronic mild compression fractures of T4 and T7. No acute osseous abnormalities are seen. No focal bone lesion. IMPRESSION: 1. Left lower lobe suture line again seen. There is increasing lobulated soft tissue density adjacent to the suture line since April 2021 exam, currently measuring 1.9 x 3.7 cm. Differential considerations include atypical infection or disease recurrence. Recommend short interval follow-up exam after course of treatment. Follow-up PET-CT or bronchoscopy may be of value. 2. Previous paramediastinal triangular soft tissue density in the right middle lobe abutting the heart border has resolved. 3. Emphysema and aortic atherosclerosis. Coronary artery calcifications. Aortic Atherosclerosis (ICD10-I70.0) and Emphysema (ICD10-J43.9). Electronically Signed   By: Keith Rake M.D.   On: 11/19/2020 22:43   CT ABDOMEN PELVIS W CONTRAST  Result Date: 11/19/2020 CLINICAL DATA:  Diffuse abdominal pain. EXAM: CT ABDOMEN AND PELVIS WITH CONTRAST TECHNIQUE: Multidetector CT imaging of the abdomen and pelvis was performed using the standard protocol following bolus administration of intravenous contrast. CONTRAST:  155mL OMNIPAQUE IOHEXOL  300 MG/ML  SOLN COMPARISON:  03/04/2019 FINDINGS: Lower chest: Increased volume loss and consolidative opacity in the left lower lobe in this patient status post anterior basal segmentectomy. Some of the increased opacity has a nodular configuration while other areas are more elongated, finger-like projections. Findings may reflect progression  of an atypical infectious etiology. Hepatobiliary: No suspicious focal abnormality within the liver parenchyma. Gallbladder is nondistended. No intrahepatic or extrahepatic biliary dilation. Pancreas: No focal mass lesion. No dilatation of the main duct. No intraparenchymal cyst. No peripancreatic edema. Spleen: No splenomegaly. No focal mass lesion. Adrenals/Urinary Tract: No adrenal nodule or mass. 7 mm low-density lesion lower pole right kidney is too small to characterize but likely benign. 3.1 cm well-defined homogeneous low-density lesion in the upper pole pole left kidney is similar to prior, compatible with a cyst. No evidence for hydroureter. The urinary bladder appears normal for the degree of distention. Stomach/Bowel: Stomach is moderately distended with fluid. Duodenum shows fluid filled distended lumen. No small bowel dilatation. Terminal ileum not discretely evident. The appendix is not well visualized, but there is no edema or inflammation in the region of the cecum. No gross colonic mass. No colonic wall thickening. Vascular/Lymphatic: There is advanced atherosclerotic calcification of the abdominal aorta without aneurysm. There is no gastrohepatic or hepatoduodenal ligament lymphadenopathy. No retroperitoneal or mesenteric lymphadenopathy. No pelvic sidewall lymphadenopathy. Reproductive: Uterus surgically absent. Dominant follicle noted left ovary. No right adnexal mass. Other: No intraperitoneal free fluid. Musculoskeletal: Degenerative changes noted in both hips. Patient is fused across the L3-4 interspace. IMPRESSION: 1. No acute findings in the abdomen or pelvis. Specifically, no findings to explain the patient's history of abdominal pain. 2. Since prior abdomen/pelvis CT and chest CT of 08/07/2019, increased volume loss and consolidative opacity in the left lower lobe in this patient status post anterior basal segmentectomy. Some of the increased opacity has a nodular configuration while  other areas are more elongated, finger-like projections. Findings may reflect progression of an atypical infectious etiology. Dedicated CT chest recommended to further evaluate. 3. Aortic Atherosclerosis (ICD10-I70.0). Electronically Signed   By: Misty Stanley M.D.   On: 11/19/2020 17:42   DG Chest Port 1 View  Result Date: 11/19/2020 CLINICAL DATA:  Cough and leukocytosis EXAM: PORTABLE CHEST 1 VIEW COMPARISON:  03/04/2019 FINDINGS: Cardiac shadow is stable. Aortic calcifications are noted. Skin fold is noted over the right apex simulating pneumothorax although lung markings are noted beyond this density. Similar findings are noted on the left. Lungs are hyperinflated with mild left basilar infiltrate similar to that seen on recent CT examination. No sizable effusion is noted. No bony abnormality is seen. IMPRESSION: Left basilar infiltrate similar to that seen on prior CT. Aortic Atherosclerosis (ICD10-I70.0) and Emphysema (ICD10-J43.9). Electronically Signed   By: Inez Catalina M.D.   On: 11/19/2020 19:07      Subjective:  Fever, no chest pain, no shortness of breath, no dysuria Discharge Exam: Vitals:   11/26/20 0608 11/26/20 0820  BP: 135/62 (!) 143/70  Pulse: 75 75  Resp: 15 16  Temp: 98.3 F (36.8 C) 98.1 F (36.7 C)  SpO2:  98%   Vitals:   11/25/20 1436 11/25/20 2005 11/26/20 0608 11/26/20 0820  BP: 137/82 127/71 135/62 (!) 143/70  Pulse:  75 75 75  Resp:  17 15 16   Temp:  (!) 97.3 F (36.3 C) 98.3 F (36.8 C) 98.1 F (36.7 C)  TempSrc:  Oral Oral Oral  SpO2:  98%  98%  Weight:  Height:        General: Pt is alert, awake, not in acute distress, extremely thin appearing Cardiovascular: RRR, S1/S2 +, no rubs, no gallops Respiratory: CTA bilaterally, no wheezing, no rhonchi Abdominal: Soft, NT, ND, bowel sounds + Extremities: no edema, no cyanosis    The results of significant diagnostics from this hospitalization (including imaging, microbiology, ancillary and  laboratory) are listed below for reference.     Microbiology: Recent Results (from the past 240 hour(s))  Urine Culture     Status: Abnormal   Collection Time: 11/19/20  8:43 AM   Specimen: Urine, Clean Catch  Result Value Ref Range Status   Specimen Description URINE, CLEAN CATCH  Final   Special Requests   Final    NONE Performed at [facility], [address] [place], [place] [address]    Culture (A)  Final    >=100,000 COLONIES/mL ESCHERICHIA COLI Confirmed Extended Spectrum Beta-Lactamase Producer (ESBL).  In bloodstream infections from ESBL organisms, carbapenems are preferred over piperacillin/tazobactam. They are shown to have a lower risk of mortality.    Report Status 11/21/2020 FINAL  Final   Organism ID, Bacteria ESCHERICHIA COLI (A)  Final      Susceptibility   Escherichia coli - MIC*    AMPICILLIN >=32 RESISTANT Resistant     CEFAZOLIN >=64 RESISTANT Resistant     CEFEPIME 16 RESISTANT Resistant     CEFTRIAXONE >=64 RESISTANT Resistant     CIPROFLOXACIN >=4 RESISTANT Resistant     GENTAMICIN <=1 SENSITIVE Sensitive     IMIPENEM <=0.25 SENSITIVE Sensitive     NITROFURANTOIN <=16 SENSITIVE Sensitive     TRIMETH/SULFA <=20 SENSITIVE Sensitive     AMPICILLIN/SULBACTAM >=32 RESISTANT Resistant     PIP/TAZO <=4 SENSITIVE Sensitive     * >=100,000 COLONIES/mL ESCHERICHIA COLI  Resp Panel by RT-PCR (Flu A&B, Covid) Nasopharyngeal Swab     Status: None   Collection Time: 11/19/20  7:24 PM   Specimen: Nasopharyngeal Swab; Nasopharyngeal(NP) swabs in vial transport medium  Result Value Ref Range Status   SARS Coronavirus 2 by RT PCR NEGATIVE NEGATIVE Final    Comment: (NOTE) SARS-CoV-2 target nucleic acids are NOT DETECTED.  The SARS-CoV-2 RNA is generally detectable in upper respiratory specimens during the acute phase of infection. The lowest concentration of SARS-CoV-2 viral copies this assay can detect is 138 copies/mL. A negative result does not preclude  SARS-Cov-2 infection and should not be used as the sole basis for treatment or other patient management decisions. A negative result may occur with  improper specimen collection/handling, submission of specimen other than nasopharyngeal swab, presence of viral mutation(s) within the areas targeted by this assay, and inadequate number of viral copies(<138 copies/mL). A negative result must be combined with clinical observations, patient history, and epidemiological information. The expected result is Negative.  Fact Sheet for Patients:  EntrepreneurPulse.com.au  Fact Sheet for Healthcare Providers:  IncredibleEmployment.be  This test is no t yet approved or cleared by the [place] FDA and  has been authorized for detection and/or diagnosis of SARS-CoV-2 by FDA under an Emergency Use Authorization (EUA). This EUA will remain  in effect (meaning this test can be used) for the duration of the COVID-19 declaration under Section 564(b)(1) of the Act, 21 U.S.C.section 360bbb-3(b)(1), unless the authorization is terminated  or revoked sooner.       Influenza A by PCR NEGATIVE NEGATIVE Final   Influenza B by PCR NEGATIVE NEGATIVE Final    Comment: (  NOTE) The Xpert Xpress SARS-CoV-2/FLU/RSV plus assay is intended as an aid in the diagnosis of influenza from Nasopharyngeal swab specimens and should not be used as a sole basis for treatment. Nasal washings and aspirates are unacceptable for Xpert Xpress SARS-CoV-2/FLU/RSV testing.  Fact Sheet for Patients: EntrepreneurPulse.com.au  Fact Sheet for Healthcare Providers: IncredibleEmployment.be  This test is not yet approved or cleared by the [place] FDA and has been authorized for detection and/or diagnosis of SARS-CoV-2 by FDA under an Emergency Use Authorization (EUA). This EUA will remain in effect (meaning this test can be used) for the duration of  the COVID-19 declaration under Section 564(b)(1) of the Act, 21 U.S.C. section 360bbb-3(b)(1), unless the authorization is terminated or revoked.  Performed at [facility], [place] [address] [place], [place] [address]   Culture, blood (routine x 2)     Status: None   Collection Time: 11/19/20 11:09 PM   Specimen: BLOOD RIGHT HAND  Result Value Ref Range Status   Specimen Description BLOOD RIGHT HAND  Final   Special Requests AEROBIC BOTTLE ONLY Blood Culture adequate volume  Final   Culture   Final    NO GROWTH 5 DAYS Performed at [facility], [place] [address], [place], [place] [address]    Report Status 11/25/2020 FINAL  Final  Culture, blood (routine x 2)     Status: Abnormal   Collection Time: 11/19/20 11:09 PM   Specimen: BLOOD  Result Value Ref Range Status   Specimen Description BLOOD LEFT ANTECUBITAL  Final   Special Requests   Final    BOTTLES DRAWN AEROBIC AND ANAEROBIC Blood Culture adequate volume   Culture  Setup Time (A)  Final    GRAM VARIABLE ROD ANAEROBIC BOTTLE ONLY CRITICAL RESULT CALLED TO, READ BACK BY AND VERIFIED WITH: J,LEDFORD PHARMD @0033  11/22/20 EB    Culture (A)  Final    BACILLUS SPECIES Standardized susceptibility testing for this organism is not available. Performed at [facility], [place] [address], [place], [place] [address]    Report Status 11/23/2020 FINAL  Final  Blood Culture ID Panel (Reflexed)     Status: None   Collection Time: 11/19/20 11:09 PM  Result Value Ref Range Status   Enterococcus faecalis NOT DETECTED NOT DETECTED Final   Enterococcus Faecium NOT DETECTED NOT DETECTED Final   Listeria monocytogenes NOT DETECTED NOT DETECTED Final   Staphylococcus species NOT DETECTED NOT DETECTED Final   Staphylococcus aureus (BCID) NOT DETECTED NOT DETECTED Final   Staphylococcus epidermidis NOT DETECTED NOT DETECTED Final   Staphylococcus lugdunensis NOT DETECTED NOT DETECTED Final   Streptococcus species NOT DETECTED  NOT DETECTED Final   Streptococcus agalactiae NOT DETECTED NOT DETECTED Final   Streptococcus pneumoniae NOT DETECTED NOT DETECTED Final   Streptococcus pyogenes NOT DETECTED NOT DETECTED Final   A.calcoaceticus-baumannii NOT DETECTED NOT DETECTED Final   Bacteroides fragilis NOT DETECTED NOT DETECTED Final   Enterobacterales NOT DETECTED NOT DETECTED Final   Enterobacter cloacae complex NOT DETECTED NOT DETECTED Final   Escherichia coli NOT DETECTED NOT DETECTED Final   Klebsiella aerogenes NOT DETECTED NOT DETECTED Final   Klebsiella oxytoca NOT DETECTED NOT DETECTED Final   Klebsiella pneumoniae NOT DETECTED NOT DETECTED Final   Proteus species NOT DETECTED NOT DETECTED Final   Salmonella species NOT DETECTED NOT DETECTED Final   Serratia marcescens NOT DETECTED NOT DETECTED Final   Haemophilus influenzae NOT DETECTED NOT DETECTED Final   Neisseria meningitidis NOT DETECTED  NOT DETECTED Final   Pseudomonas aeruginosa NOT DETECTED NOT DETECTED Final   Stenotrophomonas maltophilia NOT DETECTED NOT DETECTED Final   Candida albicans NOT DETECTED NOT DETECTED Final   Candida auris NOT DETECTED NOT DETECTED Final   Candida glabrata NOT DETECTED NOT DETECTED Final   Candida krusei NOT DETECTED NOT DETECTED Final   Candida parapsilosis NOT DETECTED NOT DETECTED Final   Candida tropicalis NOT DETECTED NOT DETECTED Final   Cryptococcus neoformans/gattii NOT DETECTED NOT DETECTED Final    Comment: Performed at [facility], [place] [address] [place], [place] [address]     Labs: BNP (last 3 results) No results for input(s): BNP in the last 8760 hours. Basic Metabolic Panel: Recent Labs  Lab 11/19/20 1336 11/19/20 2309 11/20/20 0336 11/21/20 0249 11/22/20 0239 11/23/20 0337  NA 140  --  134* 133* 133* 132*  K 3.5  --  3.8 3.5 4.1 4.6  CL 103  --  104 101 104 102  CO2 25  --  22 18* 23 23  GLUCOSE 141*  --  133* 166* 99 100*  BUN 26*  --  20 17 19 19   CREATININE 0.61  --  0.61  0.65 0.68 0.58  CALCIUM 9.3  --  8.7* 9.1 8.7* 9.1  MG  --  2.0  --   --   --   --    Liver Function Tests: Recent Labs  Lab 11/19/20 1336  AST 17  ALT 11  ALKPHOS 93  BILITOT 0.4  PROT 8.7*  ALBUMIN 3.4*   No results for input(s): LIPASE, AMYLASE in the last 168 hours. No results for input(s): AMMONIA in the last 168 hours. CBC: Recent Labs  Lab 11/20/20 0336 11/21/20 0249 11/22/20 0239 11/23/20 0337  WBC 17.9* 13.4* 10.8* 10.4  HGB 13.1 13.8 12.5 12.5  HCT 39.6 42.3 38.4 39.1  MCV 85.9 87.2 88.3 89.3  PLT 326 301 291 287   Cardiac Enzymes: No results for input(s): CKTOTAL, CKMB, CKMBINDEX, TROPONINI in the last 168 hours. BNP: Invalid input(s): POCBNP CBG: No results for input(s): GLUCAP in the last 168 hours. D-Dimer No results for input(s): DDIMER in the last 72 hours. Hgb A1c No results for input(s): HGBA1C in the last 72 hours. Lipid Profile No results for input(s): CHOL, HDL, LDLCALC, TRIG, CHOLHDL, LDLDIRECT in the last 72 hours. Thyroid function studies No results for input(s): TSH, T4TOTAL, T3FREE, THYROIDAB in the last 72 hours.  Invalid input(s): FREET3 Anemia work up No results for input(s): VITAMINB12, FOLATE, FERRITIN, TIBC, IRON, RETICCTPCT in the last 72 hours. Urinalysis    Component Value Date/Time   COLORURINE AMBER (A) 11/19/2020 0843   APPEARANCEUR CLOUDY (A) 11/19/2020 0843   LABSPEC 1.039 (H) 11/19/2020 0843   PHURINE 5.0 11/19/2020 0843   GLUCOSEU NEGATIVE 11/19/2020 0843   GLUCOSEU NEG mg/dL 12/01/2007 1427   HGBUR NEGATIVE 11/19/2020 0843   HGBUR negative 09/13/2008 1333   BILIRUBINUR NEGATIVE 11/19/2020 0843   BILIRUBINUR Negative 02/27/2014 0842   KETONESUR 5 (A) 11/19/2020 0843   PROTEINUR >=300 (A) 11/19/2020 0843   UROBILINOGEN 0.2 02/11/2015 1100   NITRITE NEGATIVE 11/19/2020 0843   LEUKOCYTESUR NEGATIVE 11/19/2020 0843   Sepsis Labs Invalid input(s): PROCALCITONIN,  WBC,  LACTICIDVEN Microbiology Recent Results  (from the past 240 hour(s))  Urine Culture     Status: Abnormal   Collection Time: 11/19/20  8:43 AM   Specimen: Urine, Clean Catch  Result Value Ref Range Status   Specimen Description URINE,  CLEAN CATCH  Final   Special Requests   Final    NONE Performed at [facility], [place] [address] [place], [place] [address]    Culture (A)  Final    >=100,000 COLONIES/mL ESCHERICHIA COLI Confirmed Extended Spectrum Beta-Lactamase Producer (ESBL).  In bloodstream infections from ESBL organisms, carbapenems are preferred over piperacillin/tazobactam. They are shown to have a lower risk of mortality.    Report Status 11/21/2020 FINAL  Final   Organism ID, Bacteria ESCHERICHIA COLI (A)  Final      Susceptibility   Escherichia coli - MIC*    AMPICILLIN >=32 RESISTANT Resistant     CEFAZOLIN >=64 RESISTANT Resistant     CEFEPIME 16 RESISTANT Resistant     CEFTRIAXONE >=64 RESISTANT Resistant     CIPROFLOXACIN >=4 RESISTANT Resistant     GENTAMICIN <=1 SENSITIVE Sensitive     IMIPENEM <=0.25 SENSITIVE Sensitive     NITROFURANTOIN <=16 SENSITIVE Sensitive     TRIMETH/SULFA <=20 SENSITIVE Sensitive     AMPICILLIN/SULBACTAM >=32 RESISTANT Resistant     PIP/TAZO <=4 SENSITIVE Sensitive     * >=100,000 COLONIES/mL ESCHERICHIA COLI  Resp Panel by RT-PCR (Flu A&B, Covid) Nasopharyngeal Swab     Status: None   Collection Time: 11/19/20  7:24 PM   Specimen: Nasopharyngeal Swab; Nasopharyngeal(NP) swabs in vial transport medium  Result Value Ref Range Status   SARS Coronavirus 2 by RT PCR NEGATIVE NEGATIVE Final    Comment: (NOTE) SARS-CoV-2 target nucleic acids are NOT DETECTED.  The SARS-CoV-2 RNA is generally detectable in upper respiratory specimens during the acute phase of infection. The lowest concentration of SARS-CoV-2 viral copies this assay can detect is 138 copies/mL. A negative result does not preclude SARS-Cov-2 infection and should not be used as the sole basis for treatment  or other patient management decisions. A negative result may occur with  improper specimen collection/handling, submission of specimen other than nasopharyngeal swab, presence of viral mutation(s) within the areas targeted by this assay, and inadequate number of viral copies(<138 copies/mL). A negative result must be combined with clinical observations, patient history, and epidemiological information. The expected result is Negative.  Fact Sheet for Patients:  EntrepreneurPulse.com.au  Fact Sheet for Healthcare Providers:  IncredibleEmployment.be  This test is no t yet approved or cleared by the [place] FDA and  has been authorized for detection and/or diagnosis of SARS-CoV-2 by FDA under an Emergency Use Authorization (EUA). This EUA will remain  in effect (meaning this test can be used) for the duration of the COVID-19 declaration under Section 564(b)(1) of the Act, 21 U.S.C.section 360bbb-3(b)(1), unless the authorization is terminated  or revoked sooner.       Influenza A by PCR NEGATIVE NEGATIVE Final   Influenza B by PCR NEGATIVE NEGATIVE Final    Comment: (NOTE) The Xpert Xpress SARS-CoV-2/FLU/RSV plus assay is intended as an aid in the diagnosis of influenza from Nasopharyngeal swab specimens and should not be used as a sole basis for treatment. Nasal washings and aspirates are unacceptable for Xpert Xpress SARS-CoV-2/FLU/RSV testing.  Fact Sheet for Patients: EntrepreneurPulse.com.au  Fact Sheet for Healthcare Providers: IncredibleEmployment.be  This test is not yet approved or cleared by the [place] FDA and has been authorized for detection and/or diagnosis of SARS-CoV-2 by FDA under an Emergency Use Authorization (EUA). This EUA will remain in effect (meaning this test can be used) for the duration of the COVID-19 declaration under Section 564(b)(1) of the Act, 21 U.S.C. section  360bbb-3(b)(1), unless the authorization is terminated or revoked.  Performed at [facility], [place] [address], [place], [place] [address]   Culture, blood (routine x 2)     Status: None   Collection Time: 11/19/20 11:09 PM   Specimen: BLOOD RIGHT HAND  Result Value Ref Range Status   Specimen Description BLOOD RIGHT HAND  Final   Special Requests AEROBIC BOTTLE ONLY Blood Culture adequate volume  Final   Culture   Final    NO GROWTH 5 DAYS Performed at [facility], [place] [address], [place], [place] [address]    Report Status 11/25/2020 FINAL  Final  Culture, blood (routine x 2)     Status: Abnormal   Collection Time: 11/19/20 11:09 PM   Specimen: BLOOD  Result Value Ref Range Status   Specimen Description BLOOD LEFT ANTECUBITAL  Final   Special Requests   Final    BOTTLES DRAWN AEROBIC AND ANAEROBIC Blood Culture adequate volume   Culture  Setup Time (A)  Final    GRAM VARIABLE ROD ANAEROBIC BOTTLE ONLY CRITICAL RESULT CALLED TO, READ BACK BY AND VERIFIED WITH: J,LEDFORD PHARMD @0033  11/22/20 EB    Culture (A)  Final    BACILLUS SPECIES Standardized susceptibility testing for this organism is not available. Performed at [facility], [place] [address] [place], [place] [address]    Report Status 11/23/2020 FINAL  Final  Blood Culture ID Panel (Reflexed)     Status: None   Collection Time: 11/19/20 11:09 PM  Result Value Ref Range Status   Enterococcus faecalis NOT DETECTED NOT DETECTED Final   Enterococcus Faecium NOT DETECTED NOT DETECTED Final   Listeria monocytogenes NOT DETECTED NOT DETECTED Final   Staphylococcus species NOT DETECTED NOT DETECTED Final   Staphylococcus aureus (BCID) NOT DETECTED NOT DETECTED Final   Staphylococcus epidermidis NOT DETECTED NOT DETECTED Final   Staphylococcus lugdunensis NOT DETECTED NOT DETECTED Final   Streptococcus species NOT DETECTED NOT DETECTED Final   Streptococcus agalactiae NOT DETECTED NOT DETECTED Final    Streptococcus pneumoniae NOT DETECTED NOT DETECTED Final   Streptococcus pyogenes NOT DETECTED NOT DETECTED Final   A.calcoaceticus-baumannii NOT DETECTED NOT DETECTED Final   Bacteroides fragilis NOT DETECTED NOT DETECTED Final   Enterobacterales NOT DETECTED NOT DETECTED Final   Enterobacter cloacae complex NOT DETECTED NOT DETECTED Final   Escherichia coli NOT DETECTED NOT DETECTED Final   Klebsiella aerogenes NOT DETECTED NOT DETECTED Final   Klebsiella oxytoca NOT DETECTED NOT DETECTED Final   Klebsiella pneumoniae NOT DETECTED NOT DETECTED Final   Proteus species NOT DETECTED NOT DETECTED Final   Salmonella species NOT DETECTED NOT DETECTED Final   Serratia marcescens NOT DETECTED NOT DETECTED Final   Haemophilus influenzae NOT DETECTED NOT DETECTED Final   Neisseria meningitidis NOT DETECTED NOT DETECTED Final   Pseudomonas aeruginosa NOT DETECTED NOT DETECTED Final   Stenotrophomonas maltophilia NOT DETECTED NOT DETECTED Final   Candida albicans NOT DETECTED NOT DETECTED Final   Candida auris NOT DETECTED NOT DETECTED Final   Candida glabrata NOT DETECTED NOT DETECTED Final   Candida krusei NOT DETECTED NOT DETECTED Final   Candida parapsilosis NOT DETECTED NOT DETECTED Final   Candida tropicalis NOT DETECTED NOT DETECTED Final   Cryptococcus neoformans/gattii NOT DETECTED NOT DETECTED Final    Comment: Performed at [facility], [address], [place], [place] [address]     Time coordinating discharge: Over 30 minutes  SIGNED:   Phillips Climes,  MD  [facility] 11/26/2020, 12:10 PM Pager   If 7PM-7AM, please contact night-coverage [email or web address] Password [email or web address]

## 2020-11-26 NOTE — Progress Notes (Addendum)
Per bedside RN- pt states she has PCP- Nolene Ebbs, MD at [facility]- to follow up with.

## 2021-01-17 ENCOUNTER — Ambulatory Visit (HOSPITAL_COMMUNITY): Admission: RE | Admit: 2021-01-17 | Payer: Medicaid Other | Source: Ambulatory Visit

## 2021-01-17 ENCOUNTER — Inpatient Hospital Stay: Payer: Medicaid Other

## 2021-01-20 ENCOUNTER — Telehealth: Payer: Self-pay | Admitting: *Deleted

## 2021-01-20 ENCOUNTER — Inpatient Hospital Stay: Payer: Medicaid Other | Admitting: Internal Medicine

## 2021-01-20 NOTE — Telephone Encounter (Signed)
LM to call Dr Worthy Flank nurse

## 2021-03-20 IMAGING — DX PORTABLE CHEST - 1 VIEW
1 series · 1 of 1 positions shown · non-contrast
Comparison: June 02, 2016

CLINICAL DATA: Chest tightness.  Labored breathing.

EXAM:
PORTABLE CHEST 1 VIEW

[chest ap]
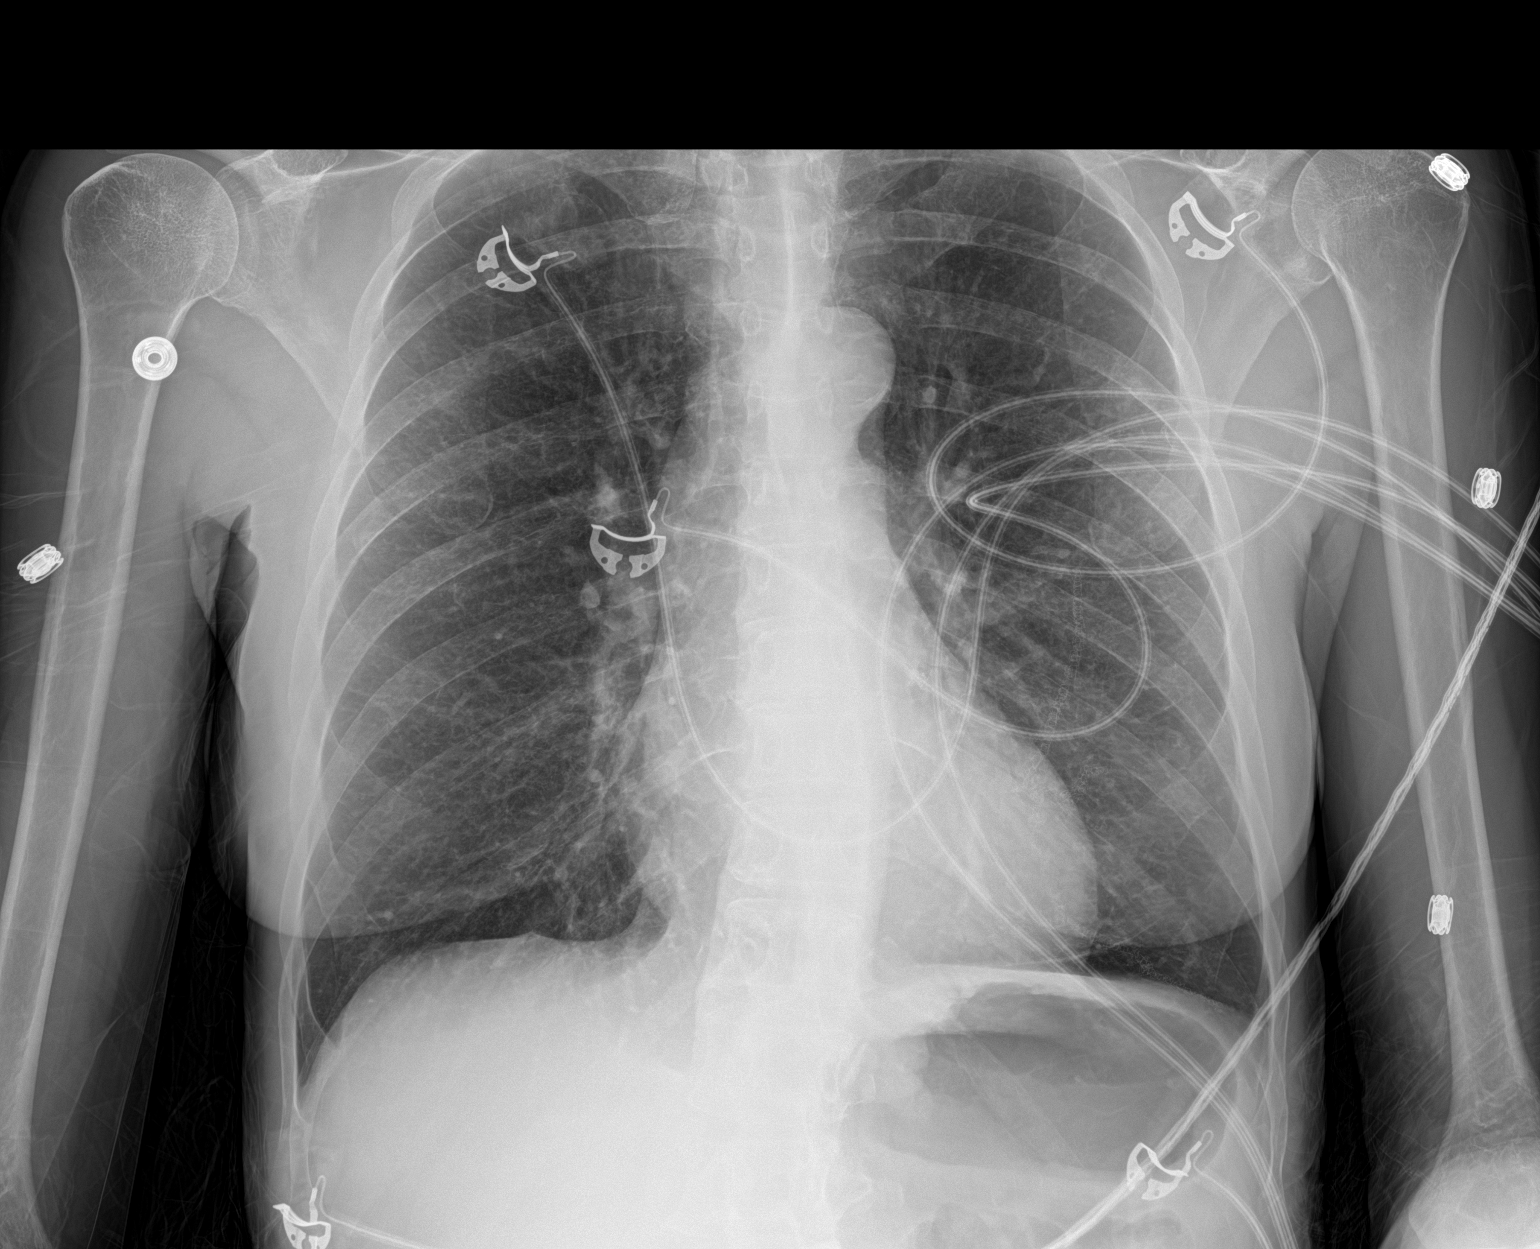

[1 of 1 positions shown; findings below may reference images not displayed]

FINDINGS: The heart size and mediastinal contours are within normal limits.
Both lungs are clear. The visualized skeletal structures are
unremarkable.
IMPRESSION: No active disease.

## 2021-04-15 ENCOUNTER — Other Ambulatory Visit: Payer: Self-pay

## 2021-04-15 ENCOUNTER — Encounter (HOSPITAL_COMMUNITY): Payer: Self-pay

## 2021-04-15 ENCOUNTER — Ambulatory Visit (HOSPITAL_COMMUNITY)
Admission: EM | Admit: 2021-04-15 | Discharge: 2021-04-15 | Disposition: A | Discharge status: Other Acute Inpatient Hospital | Payer: Medicaid Other | Attending: Psychiatry | Admitting: Psychiatry

## 2021-04-15 ENCOUNTER — Inpatient Hospital Stay (HOSPITAL_COMMUNITY)
Admission: EM | Admit: 2021-04-15 | Discharge: 2021-05-20 | DRG: 551 | Disposition: A | Discharge status: Home / Self Care | Payer: Medicaid Other | Attending: Internal Medicine | Admitting: Internal Medicine

## 2021-04-15 ENCOUNTER — Emergency Department (HOSPITAL_COMMUNITY): Payer: Medicaid Other

## 2021-04-15 DIAGNOSIS — I2510 Atherosclerotic heart disease of native coronary artery without angina pectoris: Secondary | ICD-10-CM | POA: Diagnosis present

## 2021-04-15 DIAGNOSIS — F4310 Post-traumatic stress disorder, unspecified: Secondary | ICD-10-CM | POA: Diagnosis present

## 2021-04-15 DIAGNOSIS — M4650 Other infective spondylopathies, site unspecified: Secondary | ICD-10-CM

## 2021-04-15 DIAGNOSIS — Z8249 Family history of ischemic heart disease and other diseases of the circulatory system: Secondary | ICD-10-CM

## 2021-04-15 DIAGNOSIS — Z7982 Long term (current) use of aspirin: Secondary | ICD-10-CM

## 2021-04-15 DIAGNOSIS — R251 Tremor, unspecified: Secondary | ICD-10-CM | POA: Diagnosis not present

## 2021-04-15 DIAGNOSIS — Z8673 Personal history of transient ischemic attack (TIA), and cerebral infarction without residual deficits: Secondary | ICD-10-CM

## 2021-04-15 DIAGNOSIS — F1120 Opioid dependence, uncomplicated: Secondary | ICD-10-CM | POA: Diagnosis present

## 2021-04-15 DIAGNOSIS — Z801 Family history of malignant neoplasm of trachea, bronchus and lung: Secondary | ICD-10-CM

## 2021-04-15 DIAGNOSIS — E559 Vitamin D deficiency, unspecified: Secondary | ICD-10-CM | POA: Diagnosis present

## 2021-04-15 DIAGNOSIS — L089 Local infection of the skin and subcutaneous tissue, unspecified: Secondary | ICD-10-CM

## 2021-04-15 DIAGNOSIS — I10 Essential (primary) hypertension: Secondary | ICD-10-CM | POA: Diagnosis present

## 2021-04-15 DIAGNOSIS — Z85118 Personal history of other malignant neoplasm of bronchus and lung: Secondary | ICD-10-CM

## 2021-04-15 DIAGNOSIS — F319 Bipolar disorder, unspecified: Secondary | ICD-10-CM | POA: Diagnosis present

## 2021-04-15 DIAGNOSIS — R229 Localized swelling, mass and lump, unspecified: Secondary | ICD-10-CM

## 2021-04-15 DIAGNOSIS — E785 Hyperlipidemia, unspecified: Secondary | ICD-10-CM | POA: Diagnosis present

## 2021-04-15 DIAGNOSIS — F17210 Nicotine dependence, cigarettes, uncomplicated: Secondary | ICD-10-CM | POA: Diagnosis present

## 2021-04-15 DIAGNOSIS — F1123 Opioid dependence with withdrawal: Secondary | ICD-10-CM | POA: Diagnosis present

## 2021-04-15 DIAGNOSIS — E43 Unspecified severe protein-calorie malnutrition: Secondary | ICD-10-CM | POA: Diagnosis present

## 2021-04-15 DIAGNOSIS — G629 Polyneuropathy, unspecified: Secondary | ICD-10-CM | POA: Diagnosis not present

## 2021-04-15 DIAGNOSIS — I252 Old myocardial infarction: Secondary | ICD-10-CM

## 2021-04-15 DIAGNOSIS — I330 Acute and subacute infective endocarditis: Secondary | ICD-10-CM

## 2021-04-15 DIAGNOSIS — K2970 Gastritis, unspecified, without bleeding: Secondary | ICD-10-CM | POA: Diagnosis present

## 2021-04-15 DIAGNOSIS — Z20822 Contact with and (suspected) exposure to covid-19: Secondary | ICD-10-CM | POA: Diagnosis present

## 2021-04-15 DIAGNOSIS — I351 Nonrheumatic aortic (valve) insufficiency: Secondary | ICD-10-CM

## 2021-04-15 DIAGNOSIS — M4646 Discitis, unspecified, lumbar region: Principal | ICD-10-CM | POA: Diagnosis present

## 2021-04-15 DIAGNOSIS — Z66 Do not resuscitate: Secondary | ICD-10-CM | POA: Diagnosis present

## 2021-04-15 DIAGNOSIS — Z9114 Patient's other noncompliance with medication regimen: Secondary | ICD-10-CM

## 2021-04-15 DIAGNOSIS — F1920 Other psychoactive substance dependence, uncomplicated: Secondary | ICD-10-CM | POA: Insufficient documentation

## 2021-04-15 DIAGNOSIS — E875 Hyperkalemia: Secondary | ICD-10-CM | POA: Diagnosis not present

## 2021-04-15 DIAGNOSIS — F1193 Opioid use, unspecified with withdrawal: Secondary | ICD-10-CM

## 2021-04-15 DIAGNOSIS — D649 Anemia, unspecified: Secondary | ICD-10-CM | POA: Diagnosis present

## 2021-04-15 DIAGNOSIS — R197 Diarrhea, unspecified: Secondary | ICD-10-CM

## 2021-04-15 DIAGNOSIS — L02415 Cutaneous abscess of right lower limb: Secondary | ICD-10-CM | POA: Diagnosis not present

## 2021-04-15 DIAGNOSIS — Z681 Body mass index (BMI) 19 or less, adult: Secondary | ICD-10-CM

## 2021-04-15 DIAGNOSIS — M009 Pyogenic arthritis, unspecified: Secondary | ICD-10-CM | POA: Diagnosis present

## 2021-04-15 DIAGNOSIS — Z560 Unemployment, unspecified: Secondary | ICD-10-CM

## 2021-04-15 DIAGNOSIS — R64 Cachexia: Secondary | ICD-10-CM | POA: Diagnosis present

## 2021-04-15 DIAGNOSIS — E876 Hypokalemia: Secondary | ICD-10-CM | POA: Diagnosis present

## 2021-04-15 DIAGNOSIS — G8921 Chronic pain due to trauma: Secondary | ICD-10-CM | POA: Diagnosis present

## 2021-04-15 DIAGNOSIS — F1990 Other psychoactive substance use, unspecified, uncomplicated: Secondary | ICD-10-CM

## 2021-04-15 DIAGNOSIS — B9562 Methicillin resistant Staphylococcus aureus infection as the cause of diseases classified elsewhere: Secondary | ICD-10-CM | POA: Diagnosis not present

## 2021-04-15 DIAGNOSIS — R1110 Vomiting, unspecified: Secondary | ICD-10-CM | POA: Insufficient documentation

## 2021-04-15 DIAGNOSIS — D72829 Elevated white blood cell count, unspecified: Secondary | ICD-10-CM

## 2021-04-15 DIAGNOSIS — Z79899 Other long term (current) drug therapy: Secondary | ICD-10-CM

## 2021-04-15 DIAGNOSIS — R112 Nausea with vomiting, unspecified: Principal | ICD-10-CM

## 2021-04-15 LAB — URINALYSIS, ROUTINE W REFLEX MICROSCOPIC
Bilirubin Urine: NEGATIVE
Glucose, UA: NEGATIVE mg/dL
Ketones, ur: NEGATIVE mg/dL
Leukocytes,Ua: NEGATIVE
Nitrite: NEGATIVE
Protein, ur: 30 mg/dL — AB
Specific Gravity, Urine: 1.025 (ref 1.005–1.030)
pH: 7 (ref 5.0–8.0)

## 2021-04-15 LAB — COMPREHENSIVE METABOLIC PANEL
ALT: 9 U/L (ref 0–44)
AST: 16 U/L (ref 15–41)
Albumin: 3.4 g/dL — ABNORMAL LOW (ref 3.5–5.0)
Alkaline Phosphatase: 72 U/L (ref 38–126)
Anion gap: 11 (ref 5–15)
BUN: 12 mg/dL (ref 6–20)
CO2: 21 mmol/L — ABNORMAL LOW (ref 22–32)
Calcium: 8.8 mg/dL — ABNORMAL LOW (ref 8.9–10.3)
Chloride: 103 mmol/L (ref 98–111)
Creatinine, Ser: 0.8 mg/dL (ref 0.44–1.00)
GFR, Estimated: >60 mL/min (ref >60)
Glucose, Bld: 130 mg/dL — ABNORMAL HIGH (ref 70–99)
Potassium: 3.4 mmol/L — ABNORMAL LOW (ref 3.5–5.1)
Sodium: 135 mmol/L (ref 135–145)
Total Bilirubin: 0.6 mg/dL (ref 0.3–1.2)
Total Protein: 7.6 g/dL (ref 6.5–8.1)

## 2021-04-15 LAB — CBC WITH DIFFERENTIAL/PLATELET
Abs Immature Granulocytes: 0.06 10*3/uL (ref 0.00–0.07)
Basophils Absolute: 0.1 10*3/uL (ref 0.0–0.1)
Basophils Relative: 0 %
Eosinophils Absolute: 0 10*3/uL (ref 0.0–0.5)
Eosinophils Relative: 0 %
HCT: 37.1 % (ref 36.0–46.0)
Hemoglobin: 11.7 g/dL — ABNORMAL LOW (ref 12.0–15.0)
Immature Granulocytes: 0 %
Lymphocytes Relative: 4 %
Lymphs Abs: 0.6 10*3/uL — ABNORMAL LOW (ref 0.7–4.0)
MCH: 28.2 pg (ref 26.0–34.0)
MCHC: 31.5 g/dL (ref 30.0–36.0)
MCV: 89.4 fL (ref 80.0–100.0)
Monocytes Absolute: 0.1 10*3/uL (ref 0.1–1.0)
Monocytes Relative: 1 %
Neutro Abs: 13.3 10*3/uL — ABNORMAL HIGH (ref 1.7–7.7)
Neutrophils Relative %: 95 %
Platelets: 273 10*3/uL (ref 150–400)
RBC: 4.15 MIL/uL (ref 3.87–5.11)
RDW: 13.7 % (ref 11.5–15.5)
WBC: 14.1 10*3/uL — ABNORMAL HIGH (ref 4.0–10.5)
nRBC: 0 % (ref 0.0–0.2)

## 2021-04-15 LAB — LIPASE, BLOOD: Lipase: 27 U/L (ref 11–51)

## 2021-04-15 LAB — RAPID URINE DRUG SCREEN, HOSP PERFORMED
Amphetamines: NOT DETECTED
Barbiturates: NOT DETECTED
Benzodiazepines: NOT DETECTED
Cocaine: POSITIVE — AB
Opiates: NOT DETECTED
Tetrahydrocannabinol: NOT DETECTED

## 2021-04-15 LAB — URINALYSIS, MICROSCOPIC (REFLEX)

## 2021-04-15 LAB — RESP PANEL BY RT-PCR (FLU A&B, COVID) ARPGX2
Influenza A by PCR: NEGATIVE
Influenza B by PCR: NEGATIVE
SARS Coronavirus 2 by RT PCR: NEGATIVE

## 2021-04-15 LAB — ETHANOL: Alcohol, Ethyl (B): <10 mg/dL (ref <10)

## 2021-04-15 MED ORDER — ONDANSETRON 4 MG PO TBDP
ORAL_TABLET | ORAL | Status: AC
  Filled 2021-04-15: qty 2

## 2021-04-15 NOTE — Progress Notes (Signed)
EMS arrived, paperwork given to the EMT per policy. She was transported without further incident.

## 2021-04-15 NOTE — Progress Notes (Signed)
°   04/15/21 1636  [place] (Walk-ins at [facility] only)  How Did You Hear About [place]? Self  What Is the Reason for Your Visit/Call Today? Patient presents reporting recent heroin/fentanyl use and presents with elevated BP, nausea and vomiting - likely withdrawal related or reaction to a substance.  She states she has been using 1/2 g of IV heroin for the past 3 years daily. She also reports there may have been something else in the syringe, "maybe fentanyl.  I don't know."  She had difficulty recalling a period she was clean/sober during this period.  She is also somewhat irritable and struggles to engage in triage assessment, due to feeling ill.  She is seeking detox treatment at this time. Given her acute w/d symptoms, she will be transferred to the ED for further medical evaluation and treatment of acute withdrawals.  Patient denies SI, HI and AVH.  How Long Has This Been Causing You Problems? > than 6 months  Have You Recently Had Any Thoughts About Hurting Yourself? No  Are You Planning to Commit Suicide/Harm Yourself At This time? No  Have you Recently Had Thoughts About [place]? No  Are You Planning To Harm Someone At This Time? No  Have You Used Any Alcohol or Drugs in the Past 24 Hours? Yes  What Did You Use and How Much? 1/2 of heroin, possibly laced with fentanyl or another substance  Do you have any current medical co-morbidities that require immediate attention? No  Clinician description of patient physical appearance/behavior: active withdrawal, vomiting on arrival  What Do You Feel Would Help You the Most Today? Alcohol or Drug Use Treatment  If access to [facility] was not available, would you have sought care in the Emergency Department? Yes  Determination of Need Routine (7 days)  Options For Referral ED Referral

## 2021-04-15 NOTE — ED Triage Notes (Signed)
Patient arrived by [facility] from [facility] with 2 weeks of nausea and vomiting. States that she uses narcotics daily and last used this am. Only went their for detox and very angry sent to ED. Denies SI/HI. Alert and oriented

## 2021-04-15 NOTE — ED Provider Notes (Signed)
Behavioral Health Urgent Care Medical Screening Exam  Patient Name: Brenda [name] MRN: [id number] Date of Evaluation: 04/15/21 Chief Complaint:   Diagnosis:  Final diagnoses:  Substance use disorder   [name], 61 y.o., female patient seen face to face by this provider, consulted with Dr. Serafina Mitchell; and chart reviewed on 04/15/21.    History of Present illness: Brenda [name] is a 61 y.o. female patient presented to [facility] as a walk in  voluntarily. She was dropped off by a friend. Upon assessment she has intractable vomiting. She is pale and pupils are pin point. She is diaphoretic, has goose bumps and complaining of chills. He blood pressure is 209/101.  COWS score is 11. She is restless. 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   She denies SI/HI/AVH. States, "I just dont want to live this way anymore I need help". Patient can be reassessed by Psychiatry/TTS once medically stable. She can be reviewed for admission into the [place] Unit.      Psychiatric Specialty Exam  Presentation  General Appearance:Disheveled  Eye Contact:Minimal  Speech:Pressured; Clear and Coherent  Speech Volume:Normal  Handedness:Right   Mood and Affect  Mood:Anxious  Affect:Congruent   Thought Process  Thought Processes:Coherent  Descriptions of Associations:Intact  Orientation:Full (Time, Place and Person)  Thought Content:Logical    Hallucinations:None  Ideas of Reference:None  Suicidal Thoughts:No  Homicidal Thoughts:No   Sensorium  Memory:Immediate Fair; Recent Fair; Remote Fair  Judgment:Poor  Insight:Fair   Executive Functions  Concentration:Poor  Attention Span:Good  Recall:Good  Fund of Knowledge:Good  Language:Good   Psychomotor Activity  Psychomotor Activity:Restlessness   Assets  Assets:Desire for  Improvement; Communication Skills   Sleep  Sleep:Poor  Number of hours: 0   No data recorded  Physical Exam: Physical Exam Vitals and nursing note reviewed.  Constitutional:      General: She is in acute distress.     Appearance: She is ill-appearing.  HENT:     Head: Normocephalic.  Eyes:     General:        Right eye: No discharge.        Left eye: No discharge.     Conjunctiva/sclera: Conjunctivae normal.  Cardiovascular:     Rate and Rhythm: Normal rate.     Comments: Elevated blood pressure Pulmonary:     Effort: Pulmonary effort is normal.  Musculoskeletal:        General: Normal range of motion.     Cervical back: Normal range of motion.  Skin:    Coloration: Skin is pale.  Neurological:     Mental Status: She is alert and oriented to person, place, and time.  Psychiatric:        Attention and Perception: She is inattentive.        Mood and Affect: Affect normal. Mood is anxious.        Speech: Speech is rapid and pressured.        Behavior: Behavior normal. Behavior is cooperative.        Thought Content: Thought content normal.        Cognition and Memory: Cognition normal.        Judgment: Judgment is impulsive.   Review of Systems  Constitutional:  Positive for chills, diaphoresis and malaise/fatigue.  HENT:  Negative for congestion.   Eyes:  Negative for blurred vision  and double vision.  Gastrointestinal:  Positive for vomiting.  Neurological:  Positive for tremors and weakness.  Psychiatric/Behavioral:  Positive for substance abuse. The patient is nervous/anxious.   Blood pressure (!) 209/101, pulse 90, temperature 98.1 F (36.7 C), resp. rate 18, last menstrual period 09/23/2007, SpO2 94 %. There is no height or weight on file to calculate BMI.  Musculoskeletal: Strength & Muscle Tone: decreased Gait & Station: normal Patient leans: N/A   [facility] MSE Discharge Disposition for Follow up and Recommendations: Based on my evaluation the patient  appears to have an emergency medical condition for which I recommend the patient be transferred to the emergency department for further evaluation.   Patient will be transported EMS to [facility]. Report called to Dr. Pearline Cables and charge RN [place] notified.   Patient was not administered antiemetic here due to her last EKG in 11/2020 QTC was 531.  Lab recommendations:  CBC w diff, CMP, TSH, Lipid panel, Hemoglobin A1C, magnesium, ethanol, UDS, U/A, RPR, GC Chlamydia.  Hep panel and HIV d/t IV drug use.   EKG  Please place TTS consult- Psychiatry will follow. Once patient is medically stable, She can be reviewed for treatment/admission to the  [place] [facility].   Revonda Humphrey, NP 04/15/2021, 5:04 PM

## 2021-04-15 NOTE — ED Notes (Signed)
X2 no response

## 2021-04-15 NOTE — Discharge Instructions (Addendum)
Transfer to [facility] for medical clearance.

## 2021-04-15 NOTE — ED Notes (Signed)
X1 vitals recheck no response

## 2021-04-15 NOTE — ED Provider Notes (Signed)
Emergency Medicine Provider Triage Evaluation Note  Brenda [name] , a 61 y.o. female  was evaluated in triage.  Pt complains of who presents with nausea vomiting and diarrhea.  She has had weeks of symptoms.  Patient thinks that she is in withdrawal from "boy" which she says means heroin but says that that there was no heroin in it.  She states she is using half a gram a day.  She went to urgent [facility] hook[facility] earlier and was told she was too sick for them and sent to the emergency department..  Review of Systems  Positive: Nausea vomiting Negative: Chest pain shortness of breath  Physical Exam  BP (!) 154/76    Pulse 95    Temp (!) 100.4 F (38 C) (Oral)    Resp 18    LMP 09/23/2007    SpO2 100%  Gen:   Awake, no distress   Resp:  Normal effort  MSK:   Moves extremities without difficulty  Other:  No abdominal tenderness  Medical Decision Making  Medically screening exam initiated at 6:02 PM.  Appropriate orders placed.  DEMANI WEYRAUCH was informed that the remainder of the evaluation will be completed by another provider, this initial triage assessment does not replace that evaluation, and the importance of remaining in the ED until their evaluation is complete.  Work-up initiated   Margarita Mail, PA-C 04/15/21 1803    Teressa Lower, MD 04/16/21 [phone]

## 2021-04-15 NOTE — ED Notes (Signed)
Per Hoyle Sauer NP pt was NOT given Zofran.  This was returned to pyxis.

## 2021-04-15 NOTE — ED Notes (Signed)
Pt was brought back to triage .  Alert and oriented x 4.  while getting vs started vomiting apx 350- 400cc of dark emesis.  Pt c/o of  daily opiate use of " at least a half gram per day".  Pt reports last using an unknown amount of IV drugs this am.  "I am not sure what was in it".    Pt cleaned and currently awaiting EMS for transport.

## 2021-04-16 ENCOUNTER — Emergency Department (HOSPITAL_COMMUNITY): Payer: Medicaid Other

## 2021-04-16 ENCOUNTER — Encounter (HOSPITAL_COMMUNITY): Payer: Self-pay

## 2021-04-16 DIAGNOSIS — R64 Cachexia: Secondary | ICD-10-CM | POA: Diagnosis present

## 2021-04-16 DIAGNOSIS — F1110 Opioid abuse, uncomplicated: Secondary | ICD-10-CM | POA: Diagnosis not present

## 2021-04-16 DIAGNOSIS — Z681 Body mass index (BMI) 19 or less, adult: Secondary | ICD-10-CM | POA: Diagnosis not present

## 2021-04-16 DIAGNOSIS — F1193 Opioid use, unspecified with withdrawal: Secondary | ICD-10-CM | POA: Diagnosis present

## 2021-04-16 DIAGNOSIS — L02415 Cutaneous abscess of right lower limb: Secondary | ICD-10-CM | POA: Diagnosis not present

## 2021-04-16 DIAGNOSIS — F1120 Opioid dependence, uncomplicated: Secondary | ICD-10-CM | POA: Diagnosis not present

## 2021-04-16 DIAGNOSIS — R509 Fever, unspecified: Secondary | ICD-10-CM | POA: Diagnosis not present

## 2021-04-16 DIAGNOSIS — E43 Unspecified severe protein-calorie malnutrition: Secondary | ICD-10-CM | POA: Diagnosis present

## 2021-04-16 DIAGNOSIS — M4656 Other infective spondylopathies, lumbar region: Secondary | ICD-10-CM | POA: Diagnosis not present

## 2021-04-16 DIAGNOSIS — M4650 Other infective spondylopathies, site unspecified: Secondary | ICD-10-CM

## 2021-04-16 DIAGNOSIS — G8921 Chronic pain due to trauma: Secondary | ICD-10-CM | POA: Diagnosis present

## 2021-04-16 DIAGNOSIS — I2510 Atherosclerotic heart disease of native coronary artery without angina pectoris: Secondary | ICD-10-CM | POA: Diagnosis present

## 2021-04-16 DIAGNOSIS — Z20822 Contact with and (suspected) exposure to covid-19: Secondary | ICD-10-CM | POA: Diagnosis present

## 2021-04-16 DIAGNOSIS — M4646 Discitis, unspecified, lumbar region: Secondary | ICD-10-CM | POA: Diagnosis present

## 2021-04-16 DIAGNOSIS — I340 Nonrheumatic mitral (valve) insufficiency: Secondary | ICD-10-CM | POA: Diagnosis not present

## 2021-04-16 DIAGNOSIS — M549 Dorsalgia, unspecified: Secondary | ICD-10-CM | POA: Diagnosis not present

## 2021-04-16 DIAGNOSIS — M4636 Infection of intervertebral disc (pyogenic), lumbar region: Secondary | ICD-10-CM | POA: Diagnosis not present

## 2021-04-16 DIAGNOSIS — M4626 Osteomyelitis of vertebra, lumbar region: Secondary | ICD-10-CM | POA: Diagnosis not present

## 2021-04-16 DIAGNOSIS — I252 Old myocardial infarction: Secondary | ICD-10-CM | POA: Diagnosis not present

## 2021-04-16 DIAGNOSIS — L089 Local infection of the skin and subcutaneous tissue, unspecified: Secondary | ICD-10-CM | POA: Diagnosis not present

## 2021-04-16 DIAGNOSIS — G629 Polyneuropathy, unspecified: Secondary | ICD-10-CM | POA: Diagnosis not present

## 2021-04-16 DIAGNOSIS — K2970 Gastritis, unspecified, without bleeding: Secondary | ICD-10-CM | POA: Diagnosis present

## 2021-04-16 DIAGNOSIS — Z66 Do not resuscitate: Secondary | ICD-10-CM | POA: Diagnosis present

## 2021-04-16 DIAGNOSIS — I10 Essential (primary) hypertension: Secondary | ICD-10-CM | POA: Diagnosis present

## 2021-04-16 DIAGNOSIS — M009 Pyogenic arthritis, unspecified: Secondary | ICD-10-CM | POA: Diagnosis present

## 2021-04-16 DIAGNOSIS — D72829 Elevated white blood cell count, unspecified: Secondary | ICD-10-CM | POA: Diagnosis not present

## 2021-04-16 DIAGNOSIS — R7881 Bacteremia: Secondary | ICD-10-CM | POA: Diagnosis not present

## 2021-04-16 DIAGNOSIS — F319 Bipolar disorder, unspecified: Secondary | ICD-10-CM | POA: Diagnosis present

## 2021-04-16 DIAGNOSIS — F17210 Nicotine dependence, cigarettes, uncomplicated: Secondary | ICD-10-CM | POA: Diagnosis present

## 2021-04-16 DIAGNOSIS — I351 Nonrheumatic aortic (valve) insufficiency: Secondary | ICD-10-CM | POA: Diagnosis present

## 2021-04-16 DIAGNOSIS — R197 Diarrhea, unspecified: Secondary | ICD-10-CM | POA: Diagnosis not present

## 2021-04-16 DIAGNOSIS — R112 Nausea with vomiting, unspecified: Secondary | ICD-10-CM | POA: Diagnosis not present

## 2021-04-16 DIAGNOSIS — E559 Vitamin D deficiency, unspecified: Secondary | ICD-10-CM | POA: Diagnosis present

## 2021-04-16 DIAGNOSIS — I330 Acute and subacute infective endocarditis: Secondary | ICD-10-CM | POA: Diagnosis present

## 2021-04-16 DIAGNOSIS — B9562 Methicillin resistant Staphylococcus aureus infection as the cause of diseases classified elsewhere: Secondary | ICD-10-CM | POA: Diagnosis not present

## 2021-04-16 DIAGNOSIS — E785 Hyperlipidemia, unspecified: Secondary | ICD-10-CM | POA: Diagnosis present

## 2021-04-16 DIAGNOSIS — R251 Tremor, unspecified: Secondary | ICD-10-CM | POA: Diagnosis not present

## 2021-04-16 DIAGNOSIS — F1123 Opioid dependence with withdrawal: Secondary | ICD-10-CM | POA: Diagnosis present

## 2021-04-16 DIAGNOSIS — F4310 Post-traumatic stress disorder, unspecified: Secondary | ICD-10-CM | POA: Diagnosis present

## 2021-04-16 DIAGNOSIS — R229 Localized swelling, mass and lump, unspecified: Secondary | ICD-10-CM | POA: Diagnosis not present

## 2021-04-16 DIAGNOSIS — G8929 Other chronic pain: Secondary | ICD-10-CM | POA: Diagnosis not present

## 2021-04-16 DIAGNOSIS — D649 Anemia, unspecified: Secondary | ICD-10-CM | POA: Diagnosis present

## 2021-04-16 LAB — MAGNESIUM: Magnesium: 2.1 mg/dL (ref 1.7–2.4)

## 2021-04-16 LAB — SEDIMENTATION RATE: Sed Rate: 46 mm/hr — ABNORMAL HIGH (ref 0–22)

## 2021-04-16 LAB — C-REACTIVE PROTEIN: CRP: 6.5 mg/dL — ABNORMAL HIGH (ref <1.0)

## 2021-04-16 MED ORDER — HYDROXYZINE HCL 25 MG PO TABS
25.0000 mg | ORAL_TABLET | Freq: Four times a day (QID) | ORAL | Status: AC | PRN
  Administered 2021-04-17 – 2021-04-20 (×8): 25 mg via ORAL
  Filled 2021-04-16 (×9): qty 1

## 2021-04-16 MED ORDER — IOHEXOL 350 MG/ML SOLN
75.0000 mL | Freq: Once | INTRAVENOUS | Status: AC | PRN
  Administered 2021-04-16: 75 mL via INTRAVENOUS

## 2021-04-16 MED ORDER — SENNOSIDES-DOCUSATE SODIUM 8.6-50 MG PO TABS: 1.0000 | ORAL_TABLET | Freq: Every evening | ORAL | Status: DC | PRN

## 2021-04-16 MED ORDER — METHOCARBAMOL 500 MG PO TABS
500.0000 mg | ORAL_TABLET | Freq: Three times a day (TID) | ORAL | Status: AC | PRN
  Administered 2021-04-16 – 2021-04-20 (×10): 500 mg via ORAL
  Filled 2021-04-16 (×10): qty 1

## 2021-04-16 MED ORDER — PANTOPRAZOLE SODIUM 40 MG PO TBEC
40.0000 mg | DELAYED_RELEASE_TABLET | Freq: Every day | ORAL | Status: DC
  Administered 2021-04-16 – 2021-05-01 (×16): 40 mg via ORAL
  Filled 2021-04-16 (×16): qty 1

## 2021-04-16 MED ORDER — AMLODIPINE BESYLATE 5 MG PO TABS
5.0000 mg | ORAL_TABLET | Freq: Every day | ORAL | Status: DC
  Administered 2021-04-16: 5 mg via ORAL
  Filled 2021-04-16: qty 1

## 2021-04-16 MED ORDER — MORPHINE SULFATE (PF) 2 MG/ML IV SOLN: 4.0000 mg | Freq: Once | INTRAVENOUS | Status: DC

## 2021-04-16 MED ORDER — NAPROXEN 250 MG PO TABS
500.0000 mg | ORAL_TABLET | Freq: Two times a day (BID) | ORAL | Status: DC | PRN
  Administered 2021-04-16: 500 mg via ORAL
  Filled 2021-04-16: qty 2

## 2021-04-16 MED ORDER — VANCOMYCIN HCL 500 MG/100ML IV SOLN
500.0000 mg | Freq: Two times a day (BID) | INTRAVENOUS | Status: DC
  Administered 2021-04-17 – 2021-04-18 (×3): 500 mg via INTRAVENOUS
  Filled 2021-04-16 (×3): qty 100

## 2021-04-16 MED ORDER — MORPHINE SULFATE (PF) 2 MG/ML IV SOLN
4.0000 mg | Freq: Once | INTRAVENOUS | Status: AC
  Administered 2021-04-16: 2 mg via INTRAVENOUS

## 2021-04-16 MED ORDER — GADOBUTROL 1 MMOL/ML IV SOLN
5.0000 mL | Freq: Once | INTRAVENOUS | Status: AC | PRN
  Administered 2021-04-16: 5 mL via INTRAVENOUS

## 2021-04-16 MED ORDER — LORAZEPAM 2 MG/ML IJ SOLN
1.0000 mg | Freq: Once | INTRAMUSCULAR | Status: AC
  Administered 2021-04-16: 1 mg via INTRAVENOUS
  Filled 2021-04-16: qty 1

## 2021-04-16 MED ORDER — ENOXAPARIN SODIUM 40 MG/0.4ML IJ SOSY
40.0000 mg | PREFILLED_SYRINGE | INTRAMUSCULAR | Status: DC
  Administered 2021-04-16 – 2021-04-21 (×6): 40 mg via SUBCUTANEOUS
  Filled 2021-04-16 (×6): qty 0.4

## 2021-04-16 MED ORDER — ONDANSETRON HCL 4 MG/2ML IJ SOLN: 4.0000 mg | Freq: Four times a day (QID) | INTRAMUSCULAR | Status: DC | PRN

## 2021-04-16 MED ORDER — MORPHINE SULFATE (PF) 2 MG/ML IV SOLN
INTRAVENOUS | Status: AC
  Filled 2021-04-16: qty 1

## 2021-04-16 MED ORDER — CLONIDINE HCL 0.1 MG PO TABS: 0.1000 mg | ORAL_TABLET | Freq: Two times a day (BID) | ORAL | Status: DC

## 2021-04-16 MED ORDER — HYDROMORPHONE HCL 1 MG/ML IJ SOLN
1.0000 mg | INTRAMUSCULAR | Status: DC | PRN
  Administered 2021-04-16 – 2021-04-17 (×4): 1 mg via INTRAVENOUS
  Filled 2021-04-16 (×4): qty 1

## 2021-04-16 MED ORDER — DICYCLOMINE HCL 20 MG PO TABS
20.0000 mg | ORAL_TABLET | Freq: Four times a day (QID) | ORAL | Status: AC | PRN
  Administered 2021-04-16 – 2021-04-20 (×7): 20 mg via ORAL
  Filled 2021-04-16 (×9): qty 1

## 2021-04-16 MED ORDER — LOPERAMIDE HCL 2 MG PO CAPS: 2.0000 mg | ORAL_CAPSULE | ORAL | Status: AC | PRN

## 2021-04-16 MED ORDER — SODIUM CHLORIDE 0.9 % IV SOLN
2.0000 g | INTRAVENOUS | Status: DC
  Administered 2021-04-16 – 2021-05-11 (×26): 2 g via INTRAVENOUS
  Filled 2021-04-16 (×26): qty 20

## 2021-04-16 MED ORDER — VANCOMYCIN HCL IN DEXTROSE 1-5 GM/200ML-% IV SOLN
1000.0000 mg | Freq: Once | INTRAVENOUS | Status: AC
  Administered 2021-04-16: 1000 mg via INTRAVENOUS
  Filled 2021-04-16: qty 200

## 2021-04-16 MED ORDER — CLONIDINE HCL 0.1 MG PO TABS
0.1000 mg | ORAL_TABLET | Freq: Four times a day (QID) | ORAL | Status: DC
  Administered 2021-04-16 (×2): 0.1 mg via ORAL
  Filled 2021-04-16 (×2): qty 1

## 2021-04-16 MED ORDER — CLONIDINE HCL 0.1 MG PO TABS: 0.1000 mg | ORAL_TABLET | Freq: Every day | ORAL | Status: DC

## 2021-04-16 MED ORDER — ONDANSETRON HCL 4 MG PO TABS
4.0000 mg | ORAL_TABLET | Freq: Four times a day (QID) | ORAL | Status: DC | PRN
  Administered 2021-04-17 – 2021-04-19 (×3): 4 mg via ORAL
  Filled 2021-04-16 (×3): qty 1

## 2021-04-16 MED ORDER — OXYCODONE-ACETAMINOPHEN 5-325 MG PO TABS: 1.0000 | ORAL_TABLET | Freq: Once | ORAL | Status: DC

## 2021-04-16 MED ORDER — HYDROMORPHONE HCL 1 MG/ML IJ SOLN
1.0000 mg | Freq: Once | INTRAMUSCULAR | Status: AC
  Administered 2021-04-16: 1 mg via INTRAVENOUS
  Filled 2021-04-16: qty 1

## 2021-04-16 NOTE — ED Provider Notes (Signed)
Patient reevaluated.  She is in the waiting room.  Abdominal pain and concern for withdrawal.  She had a large episode of vomit.  She appears very uncomfortable, holding her stomach and rolling on the floor. She has a history of prolonged QT interval, and her EKG shows her QTC is prolonged over 500 therefore without her being on a DOAC monitor I do not think it is safe to give her Zofran. With the degree of her pain she will require a CAT scan.  CT scan is ordered. As she appears to be in acute pain treatment with dose of morphine.   Lorin Glass, PA-C 04/17/21 0441    Fatima Blank, MD 04/18/21 [phone]
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
## 2021-04-16 NOTE — ED Notes (Signed)
Patient had one episode of a large amount of emesis. Triage RN notified

## 2021-04-16 NOTE — Progress Notes (Signed)
Pharmacy Antibiotic Note  Brenda [name] is a 61 y.o. female admitted on 04/15/2021 with  septic joint . Patient complaining of nausea, vomiting, diarrhea and back pain. Pharmacy has been consulted for vancomycin dosing.  WBC elevated at 14.1, patient remains afebrile. Scr is 0.8 and slightly above baseline (0.6).   Plan: Vancomycin IV 1g x1 Vancomycin IV 500 mg q 12h (estimated AUC 520.8) Goal AUC 400-550  Monitor renal function and signs of clinical improvement Follow-up blood cultures  Temp (24hrs), Avg:98.7 F (37.1 C), Min:98 F (36.7 C), Max:100.4 F (38 C)  Recent Labs  Lab 04/15/21 1808  WBC 14.1*  CREATININE 0.80    CrCl cannot be calculated (Unknown ideal weight.).    No Known Allergies  Antimicrobials this admission: 1/4 vancomycin >>  1/4 CTX  >>   Dose adjustments this admission: none  Microbiology results: 1/4 BCx: pending  Thank you for involving pharmacy in this patient's care.  Elita Quick, PharmD PGY1 Ambulatory Care Pharmacy Resident 04/16/2021 4:22 PM  **Pharmacist phone directory can be found on [email or web address] listed under [place]**

## 2021-04-16 NOTE — ED Notes (Signed)
Called unit again to try and speak to nurse. Was told that she will call back in about 62min

## 2021-04-16 NOTE — ED Notes (Signed)
Transported to MRI

## 2021-04-16 NOTE — ED Notes (Signed)
Called 5W attempting to reach nurse after no previous response via messaging x2.  Nurse did not answer. Left message with secretary for nurse to call back

## 2021-04-17 ENCOUNTER — Inpatient Hospital Stay (HOSPITAL_COMMUNITY): Payer: Medicaid Other

## 2021-04-17 DIAGNOSIS — R509 Fever, unspecified: Secondary | ICD-10-CM

## 2021-04-17 DIAGNOSIS — M4650 Other infective spondylopathies, site unspecified: Secondary | ICD-10-CM | POA: Diagnosis not present

## 2021-04-17 LAB — CBC
HCT: 39.7 % (ref 36.0–46.0)
Hemoglobin: 12.9 g/dL (ref 12.0–15.0)
MCH: 27.7 pg (ref 26.0–34.0)
MCHC: 32.5 g/dL (ref 30.0–36.0)
MCV: 85.4 fL (ref 80.0–100.0)
Platelets: 302 10*3/uL (ref 150–400)
RBC: 4.65 MIL/uL (ref 3.87–5.11)
RDW: 13.4 % (ref 11.5–15.5)
WBC: 14.1 10*3/uL — ABNORMAL HIGH (ref 4.0–10.5)
nRBC: 0 % (ref 0.0–0.2)

## 2021-04-17 LAB — ECHOCARDIOGRAM COMPLETE
AR max vel: 1.42 cm2
AV Area VTI: 1.41 cm2
AV Area mean vel: 1.32 cm2
AV Mean grad: 10 mmHg
AV Peak grad: 19.5 mmHg
Ao pk vel: 2.21 m/s
Area-P 1/2: 3.08 cm2
Height: 64 in
P 1/2 time: 425 msec
S' Lateral: 2.9 cm
Weight: 1664 oz

## 2021-04-17 LAB — COMPREHENSIVE METABOLIC PANEL
ALT: 8 U/L (ref 0–44)
AST: 16 U/L (ref 15–41)
Albumin: 3.4 g/dL — ABNORMAL LOW (ref 3.5–5.0)
Alkaline Phosphatase: 78 U/L (ref 38–126)
Anion gap: 10 (ref 5–15)
BUN: 17 mg/dL (ref 6–20)
CO2: 24 mmol/L (ref 22–32)
Calcium: 9.2 mg/dL (ref 8.9–10.3)
Chloride: 100 mmol/L (ref 98–111)
Creatinine, Ser: 0.81 mg/dL (ref 0.44–1.00)
GFR, Estimated: >60 mL/min (ref >60)
Glucose, Bld: 135 mg/dL — ABNORMAL HIGH (ref 70–99)
Potassium: 3.4 mmol/L — ABNORMAL LOW (ref 3.5–5.1)
Sodium: 134 mmol/L — ABNORMAL LOW (ref 135–145)
Total Bilirubin: 0.2 mg/dL — ABNORMAL LOW (ref 0.3–1.2)
Total Protein: 8.5 g/dL — ABNORMAL HIGH (ref 6.5–8.1)

## 2021-04-17 LAB — HEPATITIS B CORE ANTIBODY, TOTAL: Hep B Core Total Ab: REACTIVE — AB

## 2021-04-17 LAB — HEPATITIS B SURFACE ANTIGEN: Hepatitis B Surface Ag: NONREACTIVE

## 2021-04-17 LAB — HEPATITIS C ANTIBODY: HCV Ab: REACTIVE — AB

## 2021-04-17 LAB — HEPATITIS A ANTIBODY, IGM: Hep A IgM: NONREACTIVE

## 2021-04-17 LAB — HIV ANTIBODY (ROUTINE TESTING W REFLEX): HIV Screen 4th Generation wRfx: NONREACTIVE

## 2021-04-17 LAB — HEPATITIS A ANTIBODY, TOTAL: hep A Total Ab: REACTIVE — AB

## 2021-04-17 MED ORDER — HYDROMORPHONE HCL 1 MG/ML IJ SOLN
1.0000 mg | Freq: Once | INTRAMUSCULAR | Status: AC
  Administered 2021-04-17: 1 mg via INTRAVENOUS
  Filled 2021-04-17: qty 1

## 2021-04-17 MED ORDER — HYDRALAZINE HCL 50 MG PO TABS
50.0000 mg | ORAL_TABLET | Freq: Once | ORAL | Status: AC
  Administered 2021-04-17: 50 mg via ORAL
  Filled 2021-04-17: qty 1

## 2021-04-17 MED ORDER — HYDRALAZINE HCL 50 MG PO TABS
50.0000 mg | ORAL_TABLET | Freq: Three times a day (TID) | ORAL | Status: DC
  Administered 2021-04-17 (×2): 50 mg via ORAL
  Filled 2021-04-17 (×2): qty 1

## 2021-04-17 MED ORDER — ENSURE ENLIVE PO LIQD
237.0000 mL | Freq: Two times a day (BID) | ORAL | Status: DC
  Administered 2021-04-17 – 2021-04-24 (×13): 237 mL via ORAL

## 2021-04-17 MED ORDER — HYDRALAZINE HCL 10 MG PO TABS
10.0000 mg | ORAL_TABLET | Freq: Once | ORAL | Status: AC
  Administered 2021-04-17: 10 mg via ORAL
  Filled 2021-04-17: qty 1

## 2021-04-17 MED ORDER — HYDRALAZINE HCL 50 MG PO TABS
100.0000 mg | ORAL_TABLET | Freq: Three times a day (TID) | ORAL | Status: DC
  Administered 2021-04-17 – 2021-05-05 (×53): 100 mg via ORAL
  Filled 2021-04-17 (×53): qty 2

## 2021-04-17 MED ORDER — HYDROMORPHONE HCL 1 MG/ML IJ SOLN
2.0000 mg | INTRAMUSCULAR | Status: DC | PRN
  Administered 2021-04-17 – 2021-04-19 (×12): 2 mg via INTRAVENOUS
  Filled 2021-04-17 (×12): qty 2

## 2021-04-17 MED ORDER — AMLODIPINE BESYLATE 10 MG PO TABS
10.0000 mg | ORAL_TABLET | Freq: Every day | ORAL | Status: DC
  Administered 2021-04-17 – 2021-05-20 (×33): 10 mg via ORAL
  Filled 2021-04-17 (×33): qty 1

## 2021-04-17 MED ORDER — ADULT MULTIVITAMIN W/MINERALS CH
1.0000 | ORAL_TABLET | Freq: Every day | ORAL | Status: DC
  Administered 2021-04-17 – 2021-05-20 (×32): 1 via ORAL
  Filled 2021-04-17 (×32): qty 1

## 2021-04-17 MED ORDER — HYDROMORPHONE HCL 1 MG/ML IJ SOLN: 1.0000 mg | INTRAMUSCULAR | Status: DC | PRN

## 2021-04-17 NOTE — Progress Notes (Addendum)
Subjective:  Overnight:NAEON. Had elevated blood pressures sys>200 and was given hydralazine PO. BP improved to 190s. Home hydralazine was restarted.   Patient resting upon arrival. She endorsed significant back pain with relieve for 45 mins with the pain medicine. The abdominal pain is still present. Having a lot of back pain, which is constant. Endorsed abdominal pain and nausea but no vomiting.    Objective:  Vital signs in last 24 hours: Vitals:   04/17/21 0418 04/17/21 0500 04/17/21 0518 04/17/21 0600  BP:  (!) 211/88 (!) 211/88 (!) 196/88  Pulse: 67 60 63 70  Resp: (!) 23 20 19 18   Temp:  98.4 F (36.9 C)    TempSrc:  Oral    SpO2: 98% 98% 99% 99%  Weight:      Height:       Supplemental O2: Room Air SpO2: 99 % Physical Exam General: NAD, less anxious appearing Head: Normocephalic without scalp lesions.  Mouth and Throat: Lips normal color, without lesions.  Neck: Neck supple with full range of motion (ROM).  Cardiovascular: Normal heart sounds, no r/m/g, 2+ radial pulses Abdomen: TTP in epigastric region, normal bowel sounds MSK: No asymmetry or muscle atrophy. Full range of motion (ROM) of all joints. No injuries noted. Strength 5/5 in all extremities.  Skin: warm, dry good skin turgor, no lesions noted Neuro: Alert and oriented. CN grossly intact   Filed Weights   04/16/21 1726  Weight: 47.2 kg     Intake/Output Summary (Last 24 hours) at 04/17/2021 0700 Last data filed at 04/16/2021 1827 Gross per 24 hour  Intake 299.72 ml  Output --  Net 299.72 ml   Net IO Since Admission: 299.72 mL [04/17/21 0700]  Assessment/Plan:  Patient is a 61 year old female with relevant medical hx of polysubstance use, hx of cervical spondylosis, HTN, HLD and hx of lung cancer in remission presenting to the ED with significant back pain, nausea, vomiting and diarrhea.  Possible Septic joint arthritis Patient has back pain that has been present for years. She states it has been  getting worse recently and feels different that typical back pain. She presented with fever to 100.4, leukocytosis, and mildly elevated inflammatory markers. MRI showed diffuse degenerative changes with L2-L3 with marrow edema suggesting possible septic arthritis of the facet joint. IR consulted but do not see any fluid collection for sampling.  -Continue empiric Ceftriaxone 2 g qd, Vanc 500 mg BID -ID consulted for help with empiric antibiotic course -IV dilaudid 2 mg q4hr for pain -Robaxin 500 mg BID for paraspinal tenderness -f/u blood cultures NGTD -Echo ordered to rule out infective endocarditis -HIV negative , HCV RNA quantitative pending  Opioid Use Disorder Patient appearing calm compared to her appearance yesterday. No vomiting since the hospitalization. I suspect opiate withdrawal explains her n/v/d very well. Patient currently does not want to start methadone. Will readdress once patient back pain is more controlled.  -IV dilaudid as above  -Bentyl for diarrhea -Offer methadone or buprenorphine once pain is better controlled, hopefully tomorrow   Gastritis 2/2 to NSAID use Patient's abdominal pain still present. Since vomiting has resolved, most consistent with gastritis than gastroenteritis. The gastritis will take time to heal.  -Protonix 40 mg qd -Avoid NSAID -CTM   Severe asymptomatic HTN Multifactorial. Overnight systolic>200, hydralazine was given with slight improvement. Will uptitrate her towards her previous regimen. Pain appears to contributing to her bp so will increase her regimen.  -Increase Norvasc 10 mg, Start hydralazine 50  mg TID. up-titrate regimen as needed.    Hypokalemia Patient had hypokalemia noted on labwork. It was mildly decreased at 3.4.    Anxiety/Depression This appears to be chronic. Will address this before discharge after controlling her pain. Best course would be to assess this when patient is closer to baseline.  -Assess when patient is at  baseline -Hydroxyzine 25 mg q6hrs as needed for anxiety  DVT prophx: Lovenox Diet:Heart healthy Bowel:PRN Code:DNR   Prior to Admission Living Arrangement:Home Anticipated Discharge Location:TBD Barriers to Discharge:Medical workup Dispo: Anticipated discharge in approximately 2 day(s).   Idamae Schuller, MD Tillie Rung. [facility] Internal Medicine Residency, PGY-1  Pager: [phone] After 5 pm and on weekends: Please call the on-call pager
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
## 2021-04-17 NOTE — Progress Notes (Signed)
MD paged about pt's elevated blood pressures. MD stated will come to see pt at bedside.

## 2021-04-17 NOTE — Progress Notes (Signed)
°  Transition of Care [facility]) Screening Note   Patient Details  Name: Brenda [name] Date of Birth: 11-21-1960   Transition of Care [facility]) CM/SW Contact:    Cyndi Bender, RN Phone Number: 04/17/2021, 8:11 AM    Transition of Care Department [facility]) has reviewed patient and no TOC needs have been identified at this time. We will continue to monitor patient advancement through interdisciplinary progression rounds. If new patient transition needs arise, please place a TOC consult.

## 2021-04-17 NOTE — Progress Notes (Signed)
Consulted for a lumber spine facet aspiration.  Reviewed CT and MRI from 04/16/21.  At this time, there are no drainage fluid collections.  No plans for IR intervention at this time.

## 2021-04-17 NOTE — Progress Notes (Signed)
Initial Nutrition Assessment  DOCUMENTATION CODES:   Underweight  INTERVENTION:   Encourage good PO intake  Provided Menu to pt Make w/ assist to help with ordering  Multivitamin w/ minerals daily Ensure Enlive po BID, each supplement provides 350 kcal and 20 grams of protein  NUTRITION DIAGNOSIS:   Increased nutrient needs related to acute illness as evidenced by estimated needs.  GOAL:   Patient will meet greater than or equal to 90% of their needs  MONITOR:   PO intake, Supplement acceptance, Weight trends, Labs  REASON FOR ASSESSMENT:   Other (Comment) (Low BMI)    ASSESSMENT:   61 y.o female presented to the ED with back pain, nausea, vomiting, and diarrhea. PMH includes polysubstance abuse, HTN, lung cancer, malnutrition, and GERD. Pt admitted with L2-L3 septic joint and possible gastritis.   Pt reports that her appetite was good at home and that she was eating good. Pt unable to provide specific foods that she eats at home, but states that she takes a variety of foods. Pt denies any ONS use at home.  Pt states that she believes her UBW is ~98# and that she has had some weight loss recently but unsure how much. Per EMR, pt has not had any weight loss within the past year.   Discussed ONS with pt, pt agreeable and prefers chocolate. Pt states that she did not have a menu in her room. RD provided pt with a menu and reviewed.   Medications reviewed and include: Protonix, IV antibiotics Labs reviewed: Potassium 3.4  NUTRITION - FOCUSED PHYSICAL EXAM:  Deferred due to pt in pain.   Diet Order:   Diet Order             Diet regular Room service appropriate? Yes; Fluid consistency: Thin  Diet effective now                   EDUCATION NEEDS:   No education needs have been identified at this time  Skin:  Skin Assessment: Reviewed RN Assessment  Last BM:  No Documentation  Height:   Ht Readings from Last 1 Encounters:  04/16/21 5\' 4"  (1.626 m)     Weight:   Wt Readings from Last 1 Encounters:  04/16/21 47.2 kg    Ideal Body Weight:  54.6 kg  BMI:  Body mass index is 17.85 kg/m.  Estimated Nutritional Needs:   Kcal:  1500-1700  Protein:  75-90 grams  Fluid:  >/= 1.5 L    Brenda [name], RD, LDN Clinical Dietitian See [facility] for contact information.

## 2021-04-17 NOTE — Progress Notes (Signed)
Echocardiogram 2D Echocardiogram has been performed.  Brenda [name] 04/17/2021, 3:11 PM

## 2021-04-17 NOTE — Plan of Care (Signed)

## 2021-04-18 DIAGNOSIS — M4650 Other infective spondylopathies, site unspecified: Secondary | ICD-10-CM | POA: Diagnosis not present

## 2021-04-18 LAB — BASIC METABOLIC PANEL
Anion gap: 9 (ref 5–15)
BUN: 18 mg/dL (ref 6–20)
CO2: 22 mmol/L (ref 22–32)
Calcium: 9.2 mg/dL (ref 8.9–10.3)
Chloride: 101 mmol/L (ref 98–111)
Creatinine, Ser: 0.72 mg/dL (ref 0.44–1.00)
GFR, Estimated: >60 mL/min (ref >60)
Glucose, Bld: 129 mg/dL — ABNORMAL HIGH (ref 70–99)
Potassium: 3.5 mmol/L (ref 3.5–5.1)
Sodium: 132 mmol/L — ABNORMAL LOW (ref 135–145)

## 2021-04-18 LAB — CBC
HCT: 43.8 % (ref 36.0–46.0)
Hemoglobin: 14.6 g/dL (ref 12.0–15.0)
MCH: 28.4 pg (ref 26.0–34.0)
MCHC: 33.3 g/dL (ref 30.0–36.0)
MCV: 85.2 fL (ref 80.0–100.0)
Platelets: 318 10*3/uL (ref 150–400)
RBC: 5.14 MIL/uL — ABNORMAL HIGH (ref 3.87–5.11)
RDW: 13.3 % (ref 11.5–15.5)
WBC: 11.4 10*3/uL — ABNORMAL HIGH (ref 4.0–10.5)
nRBC: 0 % (ref 0.0–0.2)

## 2021-04-18 LAB — CK: Total CK: 19 U/L — ABNORMAL LOW (ref 38–234)

## 2021-04-18 LAB — HEPATITIS B SURFACE ANTIBODY, QUANTITATIVE: Hep B S AB Quant (Post): >1000 m[IU]/mL (ref >9.9)

## 2021-04-18 LAB — HCV RNA QUANT: HCV Quantitative: NOT DETECTED IU/mL (ref >50)

## 2021-04-18 LAB — VITAMIN B12: Vitamin B-12: 408 pg/mL (ref 180–914)

## 2021-04-18 LAB — RPR: RPR Ser Ql: NONREACTIVE

## 2021-04-18 MED ORDER — VANCOMYCIN HCL 750 MG/150ML IV SOLN
750.0000 mg | INTRAVENOUS | Status: DC
  Administered 2021-04-19 – 2021-04-24 (×6): 750 mg via INTRAVENOUS
  Filled 2021-04-18 (×7): qty 150

## 2021-04-18 MED ORDER — POTASSIUM CHLORIDE 20 MEQ PO PACK
40.0000 meq | PACK | Freq: Once | ORAL | Status: AC
  Administered 2021-04-18: 40 meq via ORAL
  Filled 2021-04-18: qty 2

## 2021-04-18 MED ORDER — ACETAMINOPHEN 500 MG PO TABS
1000.0000 mg | ORAL_TABLET | Freq: Three times a day (TID) | ORAL | Status: DC
  Administered 2021-04-18 – 2021-05-20 (×88): 1000 mg via ORAL
  Filled 2021-04-18 (×94): qty 2

## 2021-04-18 MED ORDER — LOSARTAN POTASSIUM 50 MG PO TABS
50.0000 mg | ORAL_TABLET | Freq: Every day | ORAL | Status: DC
  Administered 2021-04-18 – 2021-04-19 (×2): 50 mg via ORAL
  Filled 2021-04-18 (×2): qty 1

## 2021-04-18 NOTE — Progress Notes (Addendum)
New Bren[facility] [name] for Infectious Disease  Date of Admission:  04/15/2021   Total days of inpatient antibiotics 2  Principal Problem:   Septic joint ([place]) Active Problems:   Essential hypertension   Opioid use disorder, moderate, dependence (HCC)   Protein-calorie malnutrition, severe          Assessment: 60 YF wit hHx of IVDA(last injected fentanyl 4 days ago),  chronic back pain with cervical spinal stenosis, lumbosacral spondylosis, recurrent Ecoli UTI admitted for L2-L3 facet joint septic arthritis and management of opoid withdrawal. Pt started on vancomycin an ceftriaxone.    #L2-L3 facet joint septic arthritis #IVDA # Hx of back injections for pain -MRI showed L2-L3 facet joint with associated marrow edema and perifacetal soft tissue edema/enhancement suspicious for septic arthritis. -IR engaged, no plans for aspiration as there was no drainable fluid -Risk factors for facet joint septic arthritis include IVDA  and epidural injections. She reports her last injection was 5 years ago, it was stopped because of a  "cyst found around (her) sacrum". Her back pain and fever started about a week piror to admission.She reprots injecting IV drugs in her arms -TTE showed no vegetation.    Recommendations:  -Continue Vancomycin and ceftriaxone. Anticipate  6 week course of antibiotics for facet joint septic arthritis(EOT 05/27/21). Pt is not a home PICC line candidate due to current IVDA. OPAT orders placed if pt goes to SNF.  -Follow blood Cx - ID will sign off -Follow-up wit ID scheduled  #HCV ab +, HCV RNA not detected->consistent with cleared infection -Hep A Ab+->immune -Hep B cAb and sAB +, sAg negative->naturally immune -RPR and HIV negative  OPAT ORDERS:  Diagnosis: L2-L3 facet joint septic arthritis  Culture Result: none  No Known Allergies   Discharge antibiotics to be given via PICC line:  Per pharmacy protocol: vancomycin and ceftriaxone Aim for  Vancomycin trough 15-20 or AUC 400-550 (unless otherwise indicated)  Duration: 6 weeks End Date: 05/27/21  [facility] Care Per Protocol with [employer] Use: Home health RN for IV administration and teaching, line care and labs.     Labs weekly while on IV antibiotics: __ CBC with differential __ BMP **TWICE WEEKLY ON VANCOMYCIN  __ CMP __ CRP __ ESR __ Vancomycin trough TWICE WEEKLY __ Please pull PIC at completion of IV antibiotics  Fax weekly labs to [phone]  Clinic Follow Up Appt: 05/05/21 at 10AM @ [place] with Dr. Candiss Norse   Microbiology:   Antibiotics: Vancomcyin 1/04-p Ceftriaxone 1/04-p   Cultures: Blood 1/04 pending   SUBJECTIVE: Resting in bed. Reports back pain has improved.   Review of Systems: Review of Systems  All other systems reviewed and are negative.   Scheduled Meds:  acetaminophen  1,000 mg Oral TID   amLODipine  10 mg Oral Daily   enoxaparin (LOVENOX) injection  40 mg Subcutaneous Q24H   feeding supplement  237 mL Oral BID BM   hydrALAZINE  100 mg Oral Q8H   losartan  50 mg Oral Daily   multivitamin with minerals  1 tablet Oral Daily   pantoprazole  40 mg Oral Daily   Continuous Infusions:  cefTRIAXone (ROCEPHIN)  IV Stopped (04/18/21 1603)   [START ON 04/19/2021] vancomycin     PRN Meds:.dicyclomine, HYDROmorphone (DILAUDID) injection, hydrOXYzine, loperamide, methocarbamol, ondansetron **OR** ondansetron (ZOFRAN) IV, senna-docusate No Known Allergies  OBJECTIVE: Vitals:   04/18/21 1656 04/18/21 1933 04/18/21 1935 04/18/21 2151  BP: (!) 159/74  (!) 157/87 Marland Kitchen)  193/86  Pulse: 71 75 75   Resp: '18 17 12   ' Temp: 98.3 F (36.8 C)  98.2 F (36.8 C)   TempSrc: Oral  Oral   SpO2: 99% 97% 97%   Weight:      Height:       Body mass index is 16.08 kg/m.  Physical Exam Constitutional:      Appearance: Normal appearance.  HENT:     Head: Normocephalic and atraumatic.     Right Ear: Tympanic membrane normal.     Left Ear: Tympanic  membrane normal.     Nose: Nose normal.     Mouth/Throat:     Mouth: Mucous membranes are moist.  Eyes:     Extraocular Movements: Extraocular movements intact.     Conjunctiva/sclera: Conjunctivae normal.     Pupils: Pupils are equal, round, and reactive to light.  Cardiovascular:     Rate and Rhythm: Normal rate and regular rhythm.     Heart sounds: No murmur heard.   No friction rub. No gallop.  Pulmonary:     Effort: Pulmonary effort is normal.     Breath sounds: Normal breath sounds.  Abdominal:     General: Abdomen is flat.     Palpations: Abdomen is soft.  Musculoskeletal:        General: Normal range of motion.  Skin:    General: Skin is warm and dry.  Neurological:     General: No focal deficit present.     Mental Status: She is alert and oriented to person, place, and time.  Psychiatric:        Mood and Affect: Mood normal.      Lab Results Lab Results  Component Value Date   WBC 11.4 (H) 04/18/2021   HGB 14.6 04/18/2021   HCT 43.8 04/18/2021   MCV 85.2 04/18/2021   PLT 318 04/18/2021    Lab Results  Component Value Date   CREATININE 0.72 04/18/2021   BUN 18 04/18/2021   NA 132 (L) 04/18/2021   K 3.5 04/18/2021   CL 101 04/18/2021   CO2 22 04/18/2021    Lab Results  Component Value Date   ALT 8 04/17/2021   AST 16 04/17/2021   ALKPHOS 78 04/17/2021   BILITOT 0.2 (L) 04/17/2021        Laurice Record, MD [facility] for Infectious Disease [facility] 04/18/2021, 10:34 PM

## 2021-04-18 NOTE — Progress Notes (Signed)
Subjective:  Overnight:NAEON.   Stated her back pain was bothering her the most. Her abdominal pain had resolved. Apart from the back pain she stated she was doing well. Did not want to transition to methadone due to the pain by stating she would like to do it next week.     Objective:  Vital signs in last 24 hours:   Vitals:   04/18/21 0200 04/18/21 0400 04/18/21 0500 04/18/21 0503  BP: (!) 190/89 (!) 183/82  (!) 191/79  Pulse: 67 (!) 56 (!) 52 (!) 56  Resp: (!) 25 17 16 16   Temp:  (!) 97.4 F (36.3 C)    TempSrc:  Oral    SpO2: 96% 96%    Weight:      Height:       Supplemental O2: Room Air SpO2: 96 % Physical Exam General: NAD Head: Normocephalic without scalp lesions.  Mouth and Throat: Lips normal color, without lesions. Lungs: CTAB, no wheeze, rhonchi or rales.  Cardiovascular: Normal heart sounds, no r/m/g, 2+ radial pulses Abdomen: Slight TTP in the epigastric region, normal bowel sounds MSK: No asymmetry or muscle atrophy.  Neuro: Alert and oriented. CN grossly intact Psych: Normal mood and normal affect   Filed Weights   04/16/21 1726 04/16/21 2350  Weight: 47.2 kg 42.5 kg     Intake/Output Summary (Last 24 hours) at 04/18/2021 0525 Last data filed at 04/17/2021 2134 Gross per 24 hour  Intake 100 ml  Output 620 ml  Net -520 ml    Net IO Since Admission: -220.28 mL [04/18/21 0525]  Assessment/Plan:  Patient is a 61 year old female with relevant medical hx of polysubstance use, hx of cervical spondylosis, HTN, HLD and hx of lung cancer in remission presenting to the ED with significant back pain, nausea, vomiting and diarrhea.  Septic joint arthritis MRI shows marrow edema at L2-L3 suggestive of septic arthritis. ID recommends empiric treatment for 6 weeks since IR unable to aspirate. Leukocytosis improved. Echo showed normal EF with mild to moderate AR. Mild thickening of mitral valve.  HCV not detected. Will start decreasing her opioids as tolerated.  She complained of leg and arm and her CK was checked which was negative.  -Continue empiric Ceftriaxone 2 g qd, Vanc 500 mg BID (6 week course) -Scheduled tylenol 1000 mg TID -IV dilaudid 2 mg q4hr for pain -Robaxin 500 mg BID for paraspinal tenderness -f/u blood cultures NGTD -TOC consult due for assistance with placement  Opioid Use Disorder Patient appearing calm compared to yesterday. No vomiting since the hospitalization. I suspect opiate withdrawal explains her n/v/d very well. Patient currently does not want to start methadone. Will readdress once patient back pain is more controlled.  -IV dilaudid as above  -Bentyl for diarrhea -Offer methadone or buprenorphine once pain is better controlled   Gastritis 2/2 to NSAID use Improving. Since vomiting has resolved, most consistent with gastritis than gastroenteritis.  -Protonix 40 mg qd -Avoid NSAID -CTM   Severe asymptomatic HTN BP improved to 140s-160s/70s. Creatinine 0.81. -Increase Norvasc 10 mg, Hydralazine 100 mg TID. up-titrate regimen as needed.  -Will consider adding ACE/ARB if needed.    Hypokalemia Patient had hypokalemia noted on labwork. It was mildly decreased at 3.4.  -Will replete today with 40 mEq. -Monitor at next lab work    Anxiety/Depression This appears to be chronic. Will address this before discharge after controlling her pain. Best course would be to assess this when patient is closer to baseline.  -  Assess when patient is at baseline -Hydroxyzine 25 mg q6hrs as needed for anxiety  DVT prophx: Lovenox Diet:Heart healthy Bowel:PRN Code:DNR   Prior to Admission Living Arrangement:Home Anticipated Discharge Location:TBD Barriers to Discharge:Medical workup Dispo: Anticipated discharge in approximately 2 day(s).   Idamae Schuller, MD [facility]. [facility] Internal Medicine Residency, PGY-1  Pager: [phone] After 5 pm and on weekends: Please call the on-call pager

## 2021-04-18 NOTE — Progress Notes (Signed)
Pharmacy Antibiotic Note  Brenda [name] is a 61 y.o. female admitted on 04/15/2021 with  septic joint . Pharmacy consulted for vancomycin dosing.  WBC elevated at 14.1 on admission, now down at 11.4. Remains afebrile. Scr is down at 0.72, still above baseline (~0.6).   Plan: Vancomycin IV 750 mg q24h (estimated AUC 526, Scr rounded to 0.8, TBW used (TBW<IBW), Goal AUC 400-550 Monitor renal function and signs of clinical improvement Follow-up blood cultures  Height: 5\' 4"  (162.6 cm) Weight: 42.5 kg (93 lb 11.1 oz) IBW/kg (Calculated) : 54.7  Temp (24hrs), Avg:98.2 F (36.8 C), Min:97.4 F (36.3 C), Max:99.1 F (37.3 C)  Recent Labs  Lab 04/15/21 1808 04/17/21 0115 04/18/21 0643  WBC 14.1* 14.1* 11.4*  CREATININE 0.80 0.81 0.72    Estimated Creatinine Clearance: 50.8 mL/min (by C-G formula based on SCr of 0.72 mg/dL).    No Known Allergies  Antimicrobials this admission: 01/04 vancomycin >>  01/04 ceftriaxone  >>    Microbiology results: 01/04 BCx: NGTD   Thank you for allowing pharmacy to be a part of this patients care.  Ardyth Harps, PharmD Clinical Pharmacist

## 2021-04-18 NOTE — Progress Notes (Signed)
PHARMACY CONSULT NOTE FOR:  OUTPATIENT  PARENTERAL ANTIBIOTIC THERAPY (OPAT)  Indication: L2-L3 facet joint septic arthritis Regimen: Ceftriaxone 2g IV q24h, Vancomycin 750mg  IV q24h End date: 05/27/2021  IV antibiotic discharge orders are pended. To discharging provider:  please sign these orders via discharge navigator,  Select New Orders & click on the button choice - Manage This Unsigned Work.     Thank you for allowing pharmacy to be a part of this patient's care.   Please check AMION for all [facility] phone numbers After 10:00 PM, call [place]  Vance Peper, PharmD PGY1 Pharmacy Resident 04/18/2021 11:16 AM

## 2021-04-19 DIAGNOSIS — M4650 Other infective spondylopathies, site unspecified: Secondary | ICD-10-CM | POA: Diagnosis not present

## 2021-04-19 LAB — BASIC METABOLIC PANEL
Anion gap: 8 (ref 5–15)
BUN: 22 mg/dL — ABNORMAL HIGH (ref 6–20)
CO2: 22 mmol/L (ref 22–32)
Calcium: 9.1 mg/dL (ref 8.9–10.3)
Chloride: 102 mmol/L (ref 98–111)
Creatinine, Ser: 0.69 mg/dL (ref 0.44–1.00)
GFR, Estimated: >60 mL/min (ref >60)
Glucose, Bld: 133 mg/dL — ABNORMAL HIGH (ref 70–99)
Potassium: 3.8 mmol/L (ref 3.5–5.1)
Sodium: 132 mmol/L — ABNORMAL LOW (ref 135–145)

## 2021-04-19 LAB — MAGNESIUM: Magnesium: 2.2 mg/dL (ref 1.7–2.4)

## 2021-04-19 MED ORDER — HYDROMORPHONE HCL 1 MG/ML IJ SOLN
2.0000 mg | INTRAMUSCULAR | Status: DC | PRN
  Administered 2021-04-19 – 2021-04-22 (×21): 2 mg via INTRAVENOUS
  Filled 2021-04-19 (×21): qty 2

## 2021-04-19 MED ORDER — LOSARTAN POTASSIUM 50 MG PO TABS
100.0000 mg | ORAL_TABLET | Freq: Every day | ORAL | Status: DC
  Administered 2021-04-20 – 2021-05-13 (×24): 100 mg via ORAL
  Filled 2021-04-19 (×24): qty 2

## 2021-04-19 NOTE — Progress Notes (Addendum)
° °  Subjective:   Patient evaluated at bedside this morning. She endorses continued back pain, reporting that the medication wears off after only an hour or two. Otherwise eating and drinking well and having regular bowel movements.  Objective:  Vital signs in last 24 hours: Vitals:   04/18/21 2300 04/19/21 0026 04/19/21 0423 04/19/21 0606  BP: (!) 190/80 (!) 160/84 (!) 196/85 (!) 198/91  Pulse: 80 72 71   Resp: 17 18 18    Temp:  98.9 F (37.2 C) 98.3 F (36.8 C)   TempSrc:  Oral Oral   SpO2: 94% 95%    Weight:      Height:       General: Resting in bed, appears uncomfortable but in no acute distress CV: Warm extremities. Distal pulses 2+ bilaterally. Pulm: Normal work of breathing on room air. Neuro: Awake, alert, conversing appropriately. Psych: Normal mood, affect, speech.  Assessment/Plan:  Brenda [name] is [phone] with cervical spondylosis, hypertension, hyperlipidemia, polysubstance use admitted 1/4 with septic arthritis of facet joint L2-L3.   Principal Problem:   Septic joint ([place]) Active Problems:   Essential hypertension   Opioid use disorder, moderate, dependence (HCC)   Protein-calorie malnutrition, severe  #Septic facet joint arthritis L2-L3  Patient remains stable while on IV antibiotics. Unfortunately IR was unable to aspirate. She is still requiring IV pain medications for this, expected given her history of frequent IV opioid use. Blood cultures continue to be negative. Plan for 6 weeks of IV antibiotics per ID. We will follow-up with CSW to see if there is an option for SNF to complete her six weeks of empiric therapy. - Continue ceftriaxone and vancomycin - Tylenol 1000mg  three times daily - IV dilaudid 2mg  every 3 hours as needed for breakthrough pain - Robaxin 500mg  twice daily  #Opioid use disorder Patient this morning appears uncomfortable, reporting her pain has not been adequately controlled. She is not having anymore nausea, vomiting, or diarrhea. We  will decrease interval between dosages as noted above. Plan to add on methadone next week per patient request. Can consider adding oral agent in adjunction to IV pain medications.  #Severe asymptomatic hypertension Blood pressure still significantly elevated with systolic pressures in 818-299'B despite triple antihypertensive therapy. Will plan to increase losartan today, can consider addition of diuretic if no improvement. - Amlodipine 10mg  daily - Hydralazine 100mg  every 8 hours - Increase losartan 100mg  daily  Prior to Admission Living Arrangement: Home Anticipated Discharge Location: Pending Barriers to Discharge: medical management, placement Dispo: Anticipated discharge in approximately >2 day(s).   Sanjuan Dame, MD 04/19/2021, 6:44 AM Pager: [phone] After 5pm on weekdays and 1pm on weekends: On Call pager [phone]

## 2021-04-20 DIAGNOSIS — M4650 Other infective spondylopathies, site unspecified: Secondary | ICD-10-CM | POA: Diagnosis not present

## 2021-04-20 MED ORDER — MELATONIN 3 MG PO TABS
3.0000 mg | ORAL_TABLET | Freq: Every day | ORAL | Status: DC
  Administered 2021-04-20 – 2021-05-19 (×29): 3 mg via ORAL
  Filled 2021-04-20 (×29): qty 1

## 2021-04-20 NOTE — Progress Notes (Signed)
HD#4 SUBJECTIVE:  Patient Summary: ELYSSE [name] is a 61 y.o. with a pertinent PMH of cervical spondylosis, hypertension, hyperlipidemia, polysubstance abuse, who presented with worsening low back pain accompanied by nausea and vomiting and admitted for septic arthritis of the facet joint of L2-L3.   Overnight Events: None  Interim History: Patient reports feeling significantly better this morning.  She feels that now that her pain is under better control she has been able to sleep.  She also attributes better pain management to improvement of her blood pressures.  OBJECTIVE:  Vital Signs: Vitals:   04/20/21 0000 04/20/21 0353 04/20/21 0531 04/20/21 0741  BP: (!) 146/84 (!) 171/79 (!) 143/62 131/65  Pulse: 73 67  67  Resp: 20 19  18   Temp: 98.6 F (37 C) 98.5 F (36.9 C)  98.3 F (36.8 C)  TempSrc: Oral Oral  Oral  SpO2: 97% 98%  96%  Weight:      Height:       Supplemental O2: Room Air SpO2: 96 %  Filed Weights   04/16/21 1726 04/16/21 2350  Weight: 47.2 kg 42.5 kg     Intake/Output Summary (Last 24 hours) at 04/20/2021 0918 Last data filed at 04/20/2021 0800 Gross per 24 hour  Intake 862.91 ml  Output 120 ml  Net 742.91 ml   Net IO Since Admission: -537.37 mL [04/20/21 0918]  Physical Exam: Constitutional: Patient is resting comfortably in bed.  No acute distress noted. Cardio: Regular rate and rhythm.  No murmurs, rubs, gallops. Pulm: Clear to auscultation bilaterally.  Normal work of breathing on room air. Abdomen: Soft, nontender, nondistended. MSK: Negative for extremity edema. Skin: Skin is warm and dry. Neuro: Alert and oriented x3.  No focal deficit noted. Psych: Normal mood and affect.  Patient Lines/Drains/Airways Status     Active Line/Drains/Airways     Name Placement date Placement time Site Days   Peripheral IV 04/16/21 20 G Left Forearm 04/16/21  0142  Forearm  4             ASSESSMENT/PLAN:  Assessment: Principal Problem:   Septic  joint ([place]) Active Problems:   Essential hypertension   Opioid use disorder, moderate, dependence ([place])   Protein-calorie malnutrition, severe  Brenda [name] is a 61 y.o. with a pertinent PMH of cervical spondylosis, hypertension, hyperlipidemia, polysubstance abuse, who presented with worsening low back pain accompanied by nausea and vomiting and admitted for septic arthritis of the facet joint of L2-L3.   Plan: #Septic facet joint arthritis L2-L3 Patient is stable on IV antibiotics at this time.  Attempt at aspiration by IR on 01/05 was unsuccessful.  Patient is requiring intensive pain control at this time, however states that her pain has been much better managed over the last day.  Blood cultures continue show no growth to date.  Per ID, she will need 6 weeks of IV antibiotics. -ID following, appreciate their assistance  -Continue IV ceftriaxone and vancomycin -Tylenol 1000 mg 3 times daily -IV Dilaudid 2 mg every 3 hours as needed for breakthrough pain -Robaxin 500 mg twice daily  #Opioid use disorder Pain management plan was adjusted 01/07 to include IV Dilaudid 2 mg every 3 hours as needed for breakthrough pain, which the patient states has helped with her pain significantly.  At this time it does not seem that the patient needs an oral agent in addition to current pain management as above.  We will plan to add methadone in the next few days  per patient request.  #Severe asymptomatic hypertension Patient is showing improved blood pressure control based on last several recordings.  She is currently on triple therapy of amlodipine, hydralazine, losartan.  Given current control, we will hold off on adding a diuretic though this can be considered if reversal of blood pressure control is noted. -Amlodipine 10 mg daily -Hydralazine 100 mg every 8 hours -Losartan 100 mg daily  Best Practice: Diet: Regular diet IVF: Fluids: IV push only, no IV fluids VTE: enoxaparin (LOVENOX) injection  40 mg Start: 04/16/21 1400 Code: DNR AB: IV ceftriaxone 2 g daily, IV vancomycin 750 mg daily DISPO: Anticipated discharge in >2 days.  Signature: Farrel Gordon, D.O.  Internal Medicine Resident, PGY-1 [facility]  Pager: [phone] 9:18 AM, 04/20/2021   Please contact the on call pager after 5 pm and on weekends at [phone].

## 2021-04-20 NOTE — TOC Initial Note (Signed)
Transition of Care [facility]) - Initial/Assessment Note    Patient Details  Name: Brenda [name] MRN: [id number] Date of Birth: 06/22/1960  [facility] CM/SW Contact:    Carles Collet, RN Phone Number: 04/20/2021, 3:35 PM  Clinical Narrative:         Admitted with L2-L3 facet joint septic arthritis and management of opoid withdrawal   Anticipate  6 week course of antibiotics for facet joint septic arthritis (through 05/27/21). Pt is not a home PICC line or SNF candidate due to current IVDA (last injected fentanyl 6 days ago),       Will remain hospitalized until 2/14 and IV Abx are complete.        Expected Discharge Plan: Home/Self Care Barriers to Discharge: Continued Medical Work up   Patient Goals and CMS Choice        Expected Discharge Plan and Services Expected Discharge Plan: Home/Self Care                                              Prior Living Arrangements/Services                       Activities of Daily Living Home Assistive Devices/Equipment: Cane (specify quad or straight) ADL Screening (condition at time of admission) Patient's cognitive ability adequate to safely complete daily activities?: Yes Is the patient deaf or have difficulty hearing?: No Does the patient have difficulty seeing, even when wearing glasses/contacts?: No Does the patient have difficulty concentrating, remembering, or making decisions?: Yes Patient able to express need for assistance with ADLs?: Yes Does the patient have difficulty dressing or bathing?: No Independently performs ADLs?: Yes (appropriate for developmental age) Does the patient have difficulty walking or climbing stairs?: Yes Weakness of Legs: Both Weakness of Arms/Hands: Both  Permission Sought/Granted                  Emotional Assessment              Admission diagnosis:  Opiate withdrawal ([place]) [F11.93] Nausea vomiting and diarrhea [R11.2, R19.7] Vertebral abscess ([place])  [M46.20] Septic joint ([place]) [M00.9] Septic arthritis of vertebra, due to unspecified organism [facility]) [M46.50] Patient Active Problem List   Diagnosis Date Noted   Septic joint ([place]) 04/16/2021   Protein-calorie malnutrition, severe 11/21/2020   CAP (community acquired pneumonia) 11/19/2020   Hypertensive urgency 11/19/2020   QT prolongation 11/19/2020   MDD (major depressive disorder), recurrent severe, without psychosis ([place]) 03/05/2017   Closed left tibial fracture 03/04/2017   Opioid use disorder, moderate, dependence ([place]) 04/09/2016   Lumbar radiculopathy 12/25/2015   Spondylosis, cervical, with myelopathy and radiculopathy 12/25/2015   Cervical high risk human papillomavirus (HPV) DNA test positive 09/30/2015   Centrilobular emphysema ([place]) 03/12/2015   Primary cancer of left lower lobe of lung ([place]) 02/13/2015   Prediabetes 11/03/2014   Spinal stenosis in cervical region 02/07/2014   Irritable bowel syndrome 01/10/2014   Gastroesophageal reflux disease without esophagitis 01/10/2014   Abdominal pain 02/27/2013   Renal cyst, left 11/25/2012   Substance use disorder 10/20/2012   Posttraumatic stress disorder 07/19/2012   Bipolar I disorder ([place]) 07/19/2012   Facet arthropathy, multilevel 10/06/2011   Healthcare maintenance 06/07/2010   Tobacco abuse 06/07/2010   Hyperlipidemia 01/24/2010   Anxiety and depression 09/13/2006   Essential hypertension 09/13/2006  PCP:  Pcp, No Pharmacy:   [facility] [place] [address] Phone: [phone] Fax: [place] [address] [place] [address] Phone: [phone] Fax: [place], [place] [address] Phone: [phone] Fax: [phone]  [place], [place] - [address] [place] [address] Phone: [phone] Fax: [phone]  [facility], [place], [place] [address], [place] [address] Phone: [phone] Fax: [phone]  [facility] [place], [place] [address] Phone: [phone] Fax: [phone]     Social Determinants of Health (SDOH) Interventions    Readmission Risk Interventions No flowsheet data found.

## 2021-04-21 ENCOUNTER — Inpatient Hospital Stay: Payer: Self-pay

## 2021-04-21 DIAGNOSIS — M4650 Other infective spondylopathies, site unspecified: Secondary | ICD-10-CM | POA: Diagnosis not present

## 2021-04-21 LAB — CBC
HCT: 40.5 % (ref 36.0–46.0)
Hemoglobin: 13.5 g/dL (ref 12.0–15.0)
MCH: 28.5 pg (ref 26.0–34.0)
MCHC: 33.3 g/dL (ref 30.0–36.0)
MCV: 85.4 fL (ref 80.0–100.0)
Platelets: 307 10*3/uL (ref 150–400)
RBC: 4.74 MIL/uL (ref 3.87–5.11)
RDW: 13.5 % (ref 11.5–15.5)
WBC: 9.6 10*3/uL (ref 4.0–10.5)
nRBC: 0 % (ref 0.0–0.2)

## 2021-04-21 LAB — CULTURE, BLOOD (ROUTINE X 2)
Culture: NO GROWTH
Culture: NO GROWTH
Special Requests: ADEQUATE
Special Requests: ADEQUATE

## 2021-04-21 MED ORDER — SODIUM CHLORIDE 0.9% FLUSH
10.0000 mL | INTRAVENOUS | Status: DC | PRN
  Administered 2021-04-29 – 2021-05-17 (×7): 10 mL

## 2021-04-21 MED ORDER — CHLORHEXIDINE GLUCONATE CLOTH 2 % EX PADS
6.0000 | MEDICATED_PAD | Freq: Every day | CUTANEOUS | Status: DC
  Administered 2021-04-21 – 2021-05-20 (×30): 6 via TOPICAL

## 2021-04-21 NOTE — Progress Notes (Signed)
HD#5 SUBJECTIVE:  Patient Summary: Brenda [name] is a 61 y.o. with a pertinent PMH of cervical spondylosis, hypertension, hyperlipidemia, polysubstance abuse, who presented with worsening low back pain accompanied by nausea and vomiting and admitted for septic arthritis of the facet joint of L2-L3.   Overnight Events: None  Interim History: Patient endorses better sleep last night with addition of melatonin to medication regimen.  She endorses continued improvement of pain management.  She is very anxious about transitioning from current pain management regimen to methadone as she is afraid of experiencing severe pain.  She wishes to make this transition slowly.  OBJECTIVE:  Vital Signs: Vitals:   04/20/21 2352 04/21/21 0405 04/21/21 0738 04/21/21 1207  BP: 135/67 134/76 (!) 152/68 (!) 128/59  Pulse: 64 70 77 89  Resp: 19 18 17 19   Temp: 98.1 F (36.7 C) 98.2 F (36.8 C) 98.5 F (36.9 C) 98.2 F (36.8 C)  TempSrc: Oral Oral Oral Oral  SpO2: 97% 96% 97%   Weight:      Height:       Supplemental O2: Room Air SpO2: 97 %  Filed Weights   04/16/21 1726 04/16/21 2350  Weight: 47.2 kg 42.5 kg     Intake/Output Summary (Last 24 hours) at 04/21/2021 1231 Last data filed at 04/20/2021 1331 Gross per 24 hour  Intake 148.09 ml  Output --  Net 148.09 ml   Net IO Since Admission: -389.28 mL [04/21/21 1231]  Physical Exam: Constitutional: Resting comfortably in bed, no acute distress noted. Cardio: Regular rate and rhythm.  No murmurs, rubs, gallops. Pulm: Normal work of breathing on room air. Abdomen: Soft, nontender, nondistended. MSK: Negative for extremity edema. Skin: Skin is warm and dry. Neuro: Alert and oriented x3.  No focal deficit noted. Psych: Anxious mood with at times tearful affect.  Patient Lines/Drains/Airways Status     Active Line/Drains/Airways     Name Placement date Placement time Site Days   Peripheral IV 04/21/21 20 G 1" Left;Lateral Forearm  04/21/21  1153  Forearm  less than 1             ASSESSMENT/PLAN:  Assessment: Principal Problem:   Septic joint ([place]) Active Problems:   Essential hypertension   Opioid use disorder, moderate, dependence ([place])   Protein-calorie malnutrition, severe  Brenda [name] is a 61 y.o. with a pertinent PMH of cervical spondylosis, hypertension, hyperlipidemia, polysubstance abuse, who presented with worsening low back pain accompanied by nausea and vomiting and admitted for septic arthritis of the facet joint of L2-L3.    Plan: #Septic facet joint arthritis L2-L3 Patient is stable on IV antibiotics at this time.  Attempt at aspiration by IR on 01/05 was unsuccessful.  Patient endorses adequate pain control with current regimen.  Blood cultures continue show no growth to date.  Per ID, she will need 6 weeks of IV antibiotics. -ID following, appreciate their assistance             -Continue IV ceftriaxone and vancomycin -Tylenol 1000 mg 3 times daily -IV Dilaudid 2 mg every 3 hours as needed for breakthrough pain -Robaxin 500 mg twice daily -Order for PICC line placement placed   #Opioid use disorder Patient endorses pain management regimen of IV Dilaudid 2 mg every 3 hours as needed for breakthrough pain and Tylenol 1000 mg 3 times daily as being adequate at this time.  She is still interested in transitioning to methadone but is highly anxious about the switch regarding experiencing  severe pain.  We will plan to initiate transition to methadone in the next few days per patient request.   #Severe asymptomatic hypertension Patient is showing improved blood pressure control based on last several recordings.  She is currently on triple therapy of amlodipine, hydralazine, losartan.  Given current control, we will hold off on adding a diuretic though this can be considered if reversal of blood pressure control is noted. -Amlodipine 10 mg daily -Hydralazine 100 mg every 8 hours -Losartan 100 mg  daily   Best Practice: Diet: Regular diet IVF: Fluids: IV push only, no IV fluids VTE: enoxaparin (LOVENOX) injection 40 mg Start: 04/16/21 1400 Code: DNR AB: IV ceftriaxone 2 g daily, IV vancomycin 750 mg daily DISPO: Anticipated discharge in >2 days.  Signature: Farrel Gordon, D.O.  Internal Medicine Resident, PGY-1 [facility]  Pager: [phone] 12:31 PM, 04/21/2021   Please contact the on call pager after 5 pm and on weekends at [phone].

## 2021-04-21 NOTE — Progress Notes (Signed)
Peripherally Inserted Central Catheter Placement  The IV Nurse has discussed with the patient and/or persons authorized to consent for the patient, the purpose of this procedure and the potential benefits and risks involved with this procedure.  The benefits include less needle sticks, lab draws from the catheter, and the patient may be discharged home with the catheter. Risks include, but not limited to, infection, bleeding, blood clot (thrombus formation), and puncture of an artery; nerve damage and irregular heartbeat and possibility to perform a PICC exchange if needed/ordered by physician.  Alternatives to this procedure were also discussed.  Bard Power PICC patient education guide, fact sheet on infection prevention and patient information card has been provided to patient /or left at bedside.    PICC Placement Documentation  PICC Single Lumen 04/21/21 Right Brachial 34 cm 0 cm (Active)  Indication for Insertion or Continuance of Line Prolonged intravenous therapies 04/21/21 1710  Exposed Catheter (cm) 0 cm 04/21/21 1710  Site Assessment Clean;Dry;Intact 04/21/21 1710  Line Status Flushed;Saline locked;Blood return noted 04/21/21 1710  Dressing Type Transparent 04/21/21 1710  Dressing Status Clean;Dry;Intact 04/21/21 1710  Antimicrobial disc in place? Yes 04/21/21 1710  Dressing Intervention New dressing;Other (Comment) 04/21/21 1710  Dressing Change Due 04/28/21 04/21/21 1710       Brenda [name] 04/21/2021, 5:12 PM

## 2021-04-22 DIAGNOSIS — M4650 Other infective spondylopathies, site unspecified: Secondary | ICD-10-CM | POA: Diagnosis not present

## 2021-04-22 MED ORDER — RIVAROXABAN 10 MG PO TABS
10.0000 mg | ORAL_TABLET | Freq: Every day | ORAL | Status: DC
  Administered 2021-04-22 – 2021-05-20 (×28): 10 mg via ORAL
  Filled 2021-04-22 (×28): qty 1

## 2021-04-22 MED ORDER — HYDROMORPHONE HCL 1 MG/ML IJ SOLN
2.0000 mg | INTRAMUSCULAR | Status: AC | PRN
  Administered 2021-04-22 – 2021-04-27 (×44): 2 mg via INTRAVENOUS
  Filled 2021-04-22 (×44): qty 2

## 2021-04-22 MED ORDER — HYDROMORPHONE HCL 1 MG/ML IJ SOLN: 1.0000 mg | INTRAMUSCULAR | Status: DC | PRN

## 2021-04-22 MED ORDER — HYDROMORPHONE HCL 1 MG/ML IJ SOLN: 1.0000 mg | Freq: Four times a day (QID) | INTRAMUSCULAR | Status: DC | PRN

## 2021-04-22 MED ORDER — METHADONE HCL 10 MG PO TABS: 10.0000 mg | ORAL_TABLET | Freq: Three times a day (TID) | ORAL | Status: DC

## 2021-04-22 NOTE — Progress Notes (Addendum)
HD#6 SUBJECTIVE:  Patient Summary: Brenda [name] is a 61 y.o. with a pertinent PMH of cervical spondylosis, hypertension, hyperlipidemia, polysubstance abuse, who presented with worsening low back pain accompanied by nausea and vomiting and admitted for septic arthritis of the facet joint of L2-L3.  Overnight Events: None  Interim History: Patient reports continued improvement in how she is feeling as far as pain relief.  She is getting better sleep as well, continuing to have bowel movements and getting up into the chair for meals without issue.  Patient expresses gratitude for our team working with her to ensure pain control and being understanding of her anxiety and fears regarding the pain and transitioning to methadone.  OBJECTIVE:  Vital Signs: Vitals:   04/21/21 1800 04/21/21 2026 04/22/21 0012 04/22/21 0422  BP: 124/67 125/64 113/63 111/66  Pulse:  85 80 75  Resp:  20 18 17   Temp:  98.2 F (36.8 C) 98.6 F (37 C) 98.6 F (37 C)  TempSrc:  Oral Oral Oral  SpO2:  100% 99% 98%  Weight:      Height:       Supplemental O2: Room Air SpO2: 98 %  Filed Weights   04/16/21 1726 04/16/21 2350  Weight: 47.2 kg 42.5 kg     Intake/Output Summary (Last 24 hours) at 04/22/2021 0756 Last data filed at 04/21/2021 2030 Gross per 24 hour  Intake 1191 ml  Output --  Net 1191 ml   Net IO Since Admission: 801.72 mL [04/22/21 0756]  Physical Exam: Constitutional: Patient is resting comfortably in bed, no acute distress noted. Cardio: Regular rate and rhythm.  No murmurs, rubs, gallops. Pulm: Normal work of breathing on room air. Abdomen: Soft, nontender, nondistended. MSK: Negative for extremity edema. Skin: Skin is warm and dry. Neuro: Alert and oriented x3.  No focal deficit noted. Psych: Normal mood and affect.  Patient Lines/Drains/Airways Status     Active Line/Drains/Airways     Name Placement date Placement time Site Days   Peripheral IV 04/21/21 20 G 1" Left;Lateral  Forearm 04/21/21  1153  Forearm  1   PICC Single Lumen 04/21/21 Right Brachial 34 cm 0 cm 04/21/21  1710  Brachial  1             ASSESSMENT/PLAN:  Assessment: Principal Problem:   Septic joint (HCC) Active Problems:   Essential hypertension   Opioid use disorder, moderate, dependence ([place])   Protein-calorie malnutrition, severe   Brenda [name] is a 61 y.o. with a pertinent PMH of cervical spondylosis, hypertension, hyperlipidemia, polysubstance abuse, who presented with worsening low back pain accompanied by nausea and vomiting and admitted for septic arthritis of the facet joint of L2-L3.    Plan: #Septic arthritis of L2-L3 facet joint Patient is stable on IV antibiotics at this time.  Attempt at aspiration by IR on 01/05 was unsuccessful.  Patient endorses adequate pain control with current regimen.  Blood cultures continue show no growth to date.  Per ID, she will need 6 weeks of IV antibiotics. -ID following, appreciate their assistance  -PICC line placed 01/09             -Continue IV ceftriaxone and vancomycin with estimated end date of 05/27/2021 -Tylenol 1000 mg 3 times daily -IV Dilaudid 2 mg every 3 hours as needed for breakthrough pain -Robaxin 500 mg twice daily   #Opioid use disorder Patient endorses pain management regimen of IV Dilaudid 2 mg every 3 hours as needed for breakthrough  pain and Tylenol 1000 mg 3 times daily as being adequate at this time.  She is still interested in transitioning to methadone but is highly anxious about the switch regarding experiencing severe pain.  Unfortunately QT is prolonged at 600 so methadone is not an option at this time. Will discuss buprenorphine with her as an alternative.   #Severe asymptomatic hypertension Patient has been normotensive on triple therapy of amlodipine, hydralazine, losartan.  Given current control, we will hold off on adding a diuretic though this can be considered if reversal of blood pressure control is  noted. -Amlodipine 10 mg daily -Hydralazine 100 mg every 8 hours -Losartan 100 mg daily   Best Practice: Diet: Regular diet IVF: Fluids: IV push only, no IV fluids VTE: enoxaparin (LOVENOX) injection 40 mg Start: 04/16/21 1400 Code: DNR AB: IV ceftriaxone 2 g daily, IV vancomycin 750 mg daily DISPO: Anticipated discharge on completion of IV antibiotics.  Signature: Farrel Gordon, D.O.  Internal Medicine Resident, PGY-1 [facility]  Pager: [phone] 7:56 AM, 04/22/2021   Please contact the on call pager after 5 pm and on weekends at [phone].

## 2021-04-23 DIAGNOSIS — R112 Nausea with vomiting, unspecified: Secondary | ICD-10-CM | POA: Diagnosis not present

## 2021-04-23 DIAGNOSIS — M4650 Other infective spondylopathies, site unspecified: Secondary | ICD-10-CM | POA: Diagnosis not present

## 2021-04-23 DIAGNOSIS — R197 Diarrhea, unspecified: Secondary | ICD-10-CM | POA: Diagnosis not present

## 2021-04-23 DIAGNOSIS — F1193 Opioid use, unspecified with withdrawal: Secondary | ICD-10-CM

## 2021-04-23 LAB — VANCOMYCIN, RANDOM: Vancomycin Rm: 13

## 2021-04-23 MED ORDER — LORAZEPAM 2 MG/ML IJ SOLN
0.5000 mg | Freq: Four times a day (QID) | INTRAMUSCULAR | Status: DC | PRN
  Administered 2021-04-23 – 2021-04-28 (×18): 0.5 mg via INTRAVENOUS
  Filled 2021-04-23 (×19): qty 1

## 2021-04-23 NOTE — Progress Notes (Signed)
° °  Brenda [name] is a 61 y.o. with a pertinent PMH of cervical spondylosis, hypertension, hyperlipidemia, polysubstance abuse, who presented with worsening low back pain accompanied by nausea and vomiting and admitted for septic arthritis of the facet joint of L2-L3.  Subjective:  Overnight:NAEON  Denied any acute concerns. Stated overall improving and pain more controlled. Would like to think regarding starting buprenorphine.    Objective:  Vital signs in last 24 hours: Vitals:   04/22/21 1604 04/22/21 1956 04/22/21 2308 04/23/21 0448  BP: 132/67 (!) 145/74 137/68 (!) 142/58  Pulse: 68 70 60 67  Resp: 17 18 18 18   Temp: 98.6 F (37 C) 98.3 F (36.8 C) 98.3 F (36.8 C) 97.7 F (36.5 C)  TempSrc: Oral Oral Oral Oral  SpO2: 99% 98% 97% 95%  Weight:      Height:       Supplemental O2: Room Air SpO2: 95 % Physical Exam General: NAD Head: Normocephalic without scalp lesions.  Neck: Neck supple with full range of motion (ROM).  Lungs: CTAB, no wheeze, rhonchi or rales.  Cardiovascular: Normal heart sounds, no r/m/g, 2+ radial pulses. Abdomen: No TTP, normal bowel sounds Neuro: Alert and oriented. CN grossly intact Psych: Normal mood and normal affect  Filed Weights   04/16/21 1726 04/16/21 2350  Weight: 47.2 kg 42.5 kg     Intake/Output Summary (Last 24 hours) at 04/23/2021 0721 Last data filed at 04/22/2021 1456 Gross per 24 hour  Intake 540 ml  Output --  Net 540 ml   Net IO Since Admission: 1,341.72 mL [04/23/21 0721]  Assessment/Plan: Brenda [name] is a 61 y.o. with a pertinent PMH of cervical spondylosis, hypertension, hyperlipidemia, polysubstance abuse, who presented with worsening low back pain accompanied by nausea and vomiting and admitted for septic arthritis of the facet joint of L2-L3.   Septic Arthritis of L2-L3 facet joint Seen on MRI. Patient on IV antibiotics, Ceftriaxone 2 g qd and Vancomycin 750 mg qd until 05/27/2021. NGTD on blood cultures. ID  following. States her pain is overall well controlled.  -Continue IV abx -Pain control with IV diluadid 2g q3 hours prn -Scheduled tylenol 1000 mg TID -Robaxin 500 mg BID  Opioid use disorder Chronic. Currently on IV diluadid due to acute back pain but plan to transition to buprenorphine. States will think about this overnight.   Severe asymptomatic hypertension Hx of HTN Resolving. BP much improved.  -Continue Amlodipine 10 mg qd, Hydralazine 100 mg TID, and Losartan 100 mg qd.   Diet: Normal IVF: None,None VTE: Xareloto 10 mg Code: DNR Prior to Admission Living Arrangement:Home Anticipated Discharge Location:TBD Barriers to Discharge: IV abx Dispo: Anticipated discharge in approximately 4 weeks.   Idamae Schuller, MD Tillie Rung. [facility] Internal Medicine Residency, PGY-1  Pager: [phone] After 5 pm and on weekends: Please call the on-call pager

## 2021-04-24 DIAGNOSIS — I10 Essential (primary) hypertension: Secondary | ICD-10-CM | POA: Diagnosis not present

## 2021-04-24 DIAGNOSIS — M4636 Infection of intervertebral disc (pyogenic), lumbar region: Secondary | ICD-10-CM

## 2021-04-24 DIAGNOSIS — F1110 Opioid abuse, uncomplicated: Secondary | ICD-10-CM | POA: Diagnosis not present

## 2021-04-24 LAB — VANCOMYCIN, TROUGH: Vancomycin Tr: 4 ug/mL — ABNORMAL LOW (ref 15–20)

## 2021-04-24 MED ORDER — VANCOMYCIN HCL 750 MG/150ML IV SOLN
750.0000 mg | Freq: Two times a day (BID) | INTRAVENOUS | Status: AC
  Administered 2021-04-24 – 2021-05-13 (×39): 750 mg via INTRAVENOUS
  Filled 2021-04-24 (×41): qty 150

## 2021-04-24 MED ORDER — METHOCARBAMOL 500 MG PO TABS
500.0000 mg | ORAL_TABLET | Freq: Three times a day (TID) | ORAL | Status: DC | PRN
  Administered 2021-04-24 – 2021-04-29 (×5): 500 mg via ORAL
  Filled 2021-04-24 (×5): qty 1

## 2021-04-24 MED ORDER — ENSURE ENLIVE PO LIQD
237.0000 mL | Freq: Three times a day (TID) | ORAL | Status: DC
  Administered 2021-04-24 – 2021-05-19 (×68): 237 mL via ORAL
  Filled 2021-04-24 (×2): qty 237

## 2021-04-24 MED ORDER — SODIUM CHLORIDE 0.9 % IV SOLN
INTRAVENOUS | Status: DC | PRN
  Administered 2021-04-28: 10 mL via INTRAVENOUS

## 2021-04-24 NOTE — Plan of Care (Signed)

## 2021-04-24 NOTE — Progress Notes (Signed)
Nutrition Follow-up  DOCUMENTATION CODES:   Underweight  INTERVENTION:   Continue Multivitamin w/ minerals daily Increase Ensure Enlive to po TID, each supplement provides 350 kcal and 20 grams of protein  NUTRITION DIAGNOSIS:   Increased nutrient needs related to acute illness as evidenced by estimated needs. - Ongoing  GOAL:   Patient will meet greater than or equal to 90% of their needs - Progressing   MONITOR:   PO intake, Supplement acceptance, Weight trends, Labs  REASON FOR ASSESSMENT:   Other (Comment) (Low BMI)    ASSESSMENT:   61 y.o female presented to the ED with back pain, nausea, vomiting, and diarrhea. PMH includes polysubstance abuse, HTN, lung cancer, malnutrition, and GERD. Pt admitted with L2-L3 septic joint and possible gastritis.   Pt reports that she is doing ok. Reports that she is eating good, denies any difficulty ordering meals. Pt denies any nausea or vomiting. Pt endorses drinking the Ensure's whenever they are offered.  Per EMR, pt intake includes: 01/07: Breakfast 100%, Lunch 100% 01/08: Breakfast 75% 01/09: Breakfast 75%, Lunch 100% 01/10: Breakfast 100%  Per MD note, pt requiring 6 weeks of IV antibiotics; will remain inpatient due to unsafe discharge.   Medications reviewed and include: MVI, Protonix, IV antibiotics  Labs reviewed.   Diet Order:   Diet Order             Diet regular Room service appropriate? Yes; Fluid consistency: Thin  Diet effective now                   EDUCATION NEEDS:   No education needs have been identified at this time  Skin:  Skin Assessment: Reviewed RN Assessment  Last BM:  01/07  Height:   Ht Readings from Last 1 Encounters:  04/16/21 5\' 4"  (1.626 m)    Weight:   Wt Readings from Last 1 Encounters:  04/16/21 42.5 kg    Ideal Body Weight:  54.6 kg  BMI:  Body mass index is 16.08 kg/m.  Estimated Nutritional Needs:   Kcal:  1500-1700  Protein:  75-90 grams  Fluid:  >/=  1.5 L    Carmino Ocain Louie Casa, RD, LDN Clinical Dietitian See [facility] for contact information.

## 2021-04-24 NOTE — Progress Notes (Signed)
Pharmacy Antibiotic Note  Brenda [name] is a 61 y.o. female admitted on 04/15/2021 with  septic joint .  End date 05/27/21  Vancomycin peak 13, Vancomycin trough 4, AUC of 224  Plan: Will increase Vancomycin to 750 mg iv Q 12 hours Follow up levels next week Goal AUC 400-550  Height: 5\' 4"  (162.6 cm) Weight: 42.5 kg (93 lb 11.1 oz) IBW/kg (Calculated) : 54.7Monitor renal function and signs of clinical improvement Follow-up blood cultures  Temp (24hrs), Avg:98.5 F (36.9 C), Min:97.8 F (36.6 C), Max:98.9 F (37.2 C)  Recent Labs  Lab 04/18/21 0643 04/19/21 0039 04/21/21 0733 04/23/21 1135 04/24/21 0520  WBC 11.4*  --  9.6  --   --   CREATININE 0.72 0.69  --   --   --   VANCOTROUGH  --   --   --   --  4*  VANCORANDOM  --   --   --  13  --      Estimated Creatinine Clearance: 50.8 mL/min (by C-G formula based on SCr of 0.69 mg/dL).    No Known Allergies  Thank you  Anette Guarneri, PharmD 04/24/2021 8:26 AM  **Pharmacist phone directory can be found on [email or web address] listed under [place]**

## 2021-04-24 NOTE — Progress Notes (Signed)
° °  Brenda [name] is a 61 y.o. with a pertinent PMH of cervical spondylosis, hypertension, hyperlipidemia, polysubstance abuse, who presented with worsening low back pain accompanied by nausea and vomiting and admitted for septic arthritis of the facet joint of L2-L3.  Subjective:  Overnight:NAEON  Denied any acute concerns. Stated she is getting around 4 hours of sleep at a time. Endorsed good bowel and bladder function.   Objective:  Vital signs in last 24 hours: Vitals:   04/23/21 1951 04/23/21 2334 04/24/21 0355 04/24/21 0741  BP: (!) 119/54 (!) 117/54 131/60 (!) 121/56  Pulse: 73 69 62 68  Resp: 18 17 17 16   Temp: 98.9 F (37.2 C) 98.8 F (37.1 C) 98.8 F (37.1 C) 98.4 F (36.9 C)  TempSrc: Oral Oral Oral Oral  SpO2: 95% 97% 98% 98%  Weight:      Height:       Supplemental O2: Room Air SpO2: 98 % Physical Exam General: NAD Head: Normocephalic without scalp lesions.  Lungs: CTAB, no wheeze, rhonchi or rales.  Cardiovascular: Normal heart sounds, no r/m/g, 2+  radial pulses. Abdomen: No TTP, normal bowel sounds MSK: No asymmetry or muscle atrophy.  Neuro: Alert and oriented. CN grossly intact Psych: Normal mood and normal affect  Filed Weights   04/16/21 1726 04/16/21 2350  Weight: 47.2 kg 42.5 kg     Intake/Output Summary (Last 24 hours) at 04/24/2021 1039 Last data filed at 04/24/2021 0981 Gross per 24 hour  Intake 1049.35 ml  Output --  Net 1049.35 ml   Net IO Since Admission: 2,391.07 mL [04/24/21 1039]  Assessment/Plan: Brenda [name] is a 61 y.o. with a pertinent PMH of cervical spondylosis, hypertension, hyperlipidemia, polysubstance abuse, who presented with worsening low back pain accompanied by nausea and vomiting and admitted for septic arthritis of the facet joint of L2-L3.   Septic Arthritis of L2-L3 facet joint Seen on MRI. Patient on IV antibiotics, Ceftriaxone 2 g qd and Vancomycin 750 mg qd until 05/27/2021. NGTD on blood cultures. ID  following. States her pain is overall well controlled.  -Continue IV abx -Pain control with IV diluadid 2g q3 hours prn -Scheduled tylenol 1000 mg TID -Robaxin 500 mg BID  Opioid use disorder Chronic. Currently on IV diluadid due to acute back pain but plan to transition to buprenorphine. States she would want to transition over the weekend.   Hypertension Well controlled with current regimen.  -Continue Amlodipine 10 mg qd, Hydralazine 100 mg TID, and Losartan 100 mg qd.   Diet: Normal IVF: None,None VTE: Xareloto 10 mg Code: DNR Prior to Admission Living Arrangement:Home Anticipated Discharge Location:TBD Barriers to Discharge: IV abx Dispo: Anticipated discharge in approximately 4 weeks.   Brenda Schuller, MD Tillie Rung. [facility] Internal Medicine Residency, PGY-1  Pager: [phone] After 5 pm and on weekends: Please call the on-call pager

## 2021-04-25 DIAGNOSIS — M4650 Other infective spondylopathies, site unspecified: Secondary | ICD-10-CM | POA: Diagnosis not present

## 2021-04-25 LAB — CBC WITH DIFFERENTIAL/PLATELET
Abs Immature Granulocytes: 0.03 10*3/uL (ref 0.00–0.07)
Basophils Absolute: 0.1 10*3/uL (ref 0.0–0.1)
Basophils Relative: 1 %
Eosinophils Absolute: 0.9 10*3/uL — ABNORMAL HIGH (ref 0.0–0.5)
Eosinophils Relative: 10 %
HCT: 36.2 % (ref 36.0–46.0)
Hemoglobin: 11.6 g/dL — ABNORMAL LOW (ref 12.0–15.0)
Immature Granulocytes: 0 %
Lymphocytes Relative: 29 %
Lymphs Abs: 2.8 10*3/uL (ref 0.7–4.0)
MCH: 28.5 pg (ref 26.0–34.0)
MCHC: 32 g/dL (ref 30.0–36.0)
MCV: 88.9 fL (ref 80.0–100.0)
Monocytes Absolute: 0.6 10*3/uL (ref 0.1–1.0)
Monocytes Relative: 6 %
Neutro Abs: 5 10*3/uL (ref 1.7–7.7)
Neutrophils Relative %: 54 %
Platelets: 282 10*3/uL (ref 150–400)
RBC: 4.07 MIL/uL (ref 3.87–5.11)
RDW: 13.8 % (ref 11.5–15.5)
WBC: 9.4 10*3/uL (ref 4.0–10.5)
nRBC: 0 % (ref 0.0–0.2)

## 2021-04-25 LAB — BASIC METABOLIC PANEL
Anion gap: 7 (ref 5–15)
BUN: 18 mg/dL (ref 6–20)
CO2: 27 mmol/L (ref 22–32)
Calcium: 9.1 mg/dL (ref 8.9–10.3)
Chloride: 103 mmol/L (ref 98–111)
Creatinine, Ser: 0.7 mg/dL (ref 0.44–1.00)
GFR, Estimated: >60 mL/min (ref >60)
Glucose, Bld: 123 mg/dL — ABNORMAL HIGH (ref 70–99)
Potassium: 4.4 mmol/L (ref 3.5–5.1)
Sodium: 137 mmol/L (ref 135–145)

## 2021-04-25 NOTE — Progress Notes (Signed)
° °  Brenda [name] is a 61 y.o. with a pertinent PMH of cervical spondylosis, hypertension, hyperlipidemia, polysubstance abuse, who presented with worsening low back pain accompanied by nausea and vomiting and admitted for septic arthritis of the facet joint of L2-L3.  Subjective:  Overnight:NAEON Feeling about the same.  Would like crosswod puzzles or coloring books.  She feels overwhelmed by 6 weeks work of antibiotics and has anxiety and a lot of fear with transitioning dilaudid to suboxone - wants to wait until Monday. Became tearful upon interview. Tried to alleviate her concerns regarding the transition.    Objective:  Vital signs in last 24 hours: Vitals wnl, afebrile, normotensive Vitals:   04/24/21 2143 04/24/21 2353 04/25/21 0340 04/25/21 0516  BP: 134/70 (!) 132/58 124/75 131/60  Pulse:  72 68   Resp:  17 18   Temp:  98.5 F (36.9 C) 98.7 F (37.1 C)   TempSrc:  Oral Oral   SpO2:  96% 97%   Weight:      Height:       Supplemental O2: Room Air SpO2: 97 % Physical Exam General: NAD Head: Normocephalic without scalp lesions.  Mouth and Throat: Lips normal color, without lesions.  Lungs: CTAB, no wheeze, rhonchi or rales.  Cardiovascular: Normal heart sounds, 2+ radial pulses  Abdomen: No TTP, normal bowel sounds MSK: No asymmetry or muscle atrophy. Neuro: Alert and oriented. CN grossly intact Psych: Normal mood and normal affect  Filed Weights   04/16/21 1726 04/16/21 2350  Weight: 47.2 kg 42.5 kg     Intake/Output Summary (Last 24 hours) at 04/25/2021 0543 Last data filed at 04/25/2021 0424 Gross per 24 hour  Intake 1299.35 ml  Output --  Net 1299.35 ml    Net IO Since Admission: 2,641.07 mL [04/25/21 0543]  Assessment/Plan: [name] is a 61 y.o. with a pertinent PMH of cervical spondylosis, hypertension, hyperlipidemia, polysubstance abuse, who presented with worsening low back pain accompanied by nausea and vomiting and admitted for septic  arthritis of the facet joint of L2-L3.   Septic Arthritis of L2-L3 facet joint Seen on MRI. Patient on IV antibiotics, Ceftriaxone 2 g qd and Vancomycin 750 mg qd until 05/27/2021. NGTD on blood cultures. ID following. States her pain is overall well controlled. CBC shows improved leukocytosis and no leukopenia or neutropenia. Plan to monitor her CBC on a weekly basis unless clinically indicated earlier.  -Continue IV abx -Pain control with IV diluadid 2g q3 hours prn -Scheduled tylenol 1000 mg TID -Robaxin 500 mg BID  Opioid use disorder Chronic. Currently on IV diluadid due to acute back pain but plan to transition to buprenorphine. States she would want to transition on Monday. Appears be have anxiety regarding the transition.    Hypertension Well controlled with current regimen. Creatinine function at baseline. If she continues to be normotensive will decrease her hydralazine slowly.  -Continue Amlodipine 10 mg qd, Hydralazine 100 mg TID, and Losartan 100 mg qd.   Diet: Normal IVF: None,None VTE: Xareloto 10 mg Code: DNR Prior to Admission Living Arrangement:Home Anticipated Discharge Location:TBD Barriers to Discharge: IV abx Dispo: Anticipated discharge in approximately 4 weeks.   Idamae Schuller, MD Tillie Rung. [facility] Internal Medicine Residency, PGY-1  Pager: [phone] After 5 pm and on weekends: Please call the on-call pager

## 2021-04-26 DIAGNOSIS — M4650 Other infective spondylopathies, site unspecified: Secondary | ICD-10-CM | POA: Diagnosis not present

## 2021-04-26 NOTE — Progress Notes (Signed)
° °  NAME:  Brenda [name], MRN:  [id number], DOB:  [birth year], LOS: 10 ADMISSION DATE:  04/15/2021  Subjective  Patient evaluated at bedside this AM. Reports pain is tolerable at current dose, would like to defer to Monday to initiate suboxone.  Objective   Blood pressure 132/61, pulse 79, temperature 97.9 F (36.6 C), temperature source Axillary, resp. rate 15, height 5\' 4"  (1.626 m), weight 42.5 kg, last menstrual period 09/23/2007, SpO2 96 %.     Intake/Output Summary (Last 24 hours) at 04/26/2021 1240 Last data filed at 04/25/2021 1513 Gross per 24 hour  Intake 180 ml  Output --  Net 180 ml   Filed Weights   04/16/21 1726 04/16/21 2350  Weight: 47.2 kg 42.5 kg   Physical Exam: General: Resting comfortably in no acute distress Pulm: Normal work of breathing on room air MSK: Normal bulk, tone. No lower extremity edema Neuro: Awake, alert, conversing appropriately.   Labs    CBC Latest Ref Rng & Units 04/25/2021 04/21/2021 04/18/2021  WBC 4.0 - 10.5 K/uL 9.4 9.6 11.4(H)  Hemoglobin 12.0 - 15.0 g/dL 11.6(L) 13.5 14.6  Hematocrit 36.0 - 46.0 % 36.2 40.5 43.8  Platelets 150 - 400 K/uL 282 307 318   BMP Latest Ref Rng & Units 04/25/2021 04/19/2021 04/18/2021  Glucose 70 - 99 mg/dL 123(H) 133(H) 129(H)  BUN 6 - 20 mg/dL 18 22(H) 18  Creatinine 0.44 - 1.00 mg/dL 0.70 0.69 0.72  BUN/Creat Ratio 9 - 23 - - -  Sodium 135 - 145 mmol/L 137 132(L) 132(L)  Potassium 3.5 - 5.1 mmol/L 4.4 3.8 3.5  Chloride 98 - 111 mmol/L 103 102 101  CO2 22 - 32 mmol/L 27 22 22   Calcium 8.9 - 10.3 mg/dL 9.1 9.1 9.2   Summary  Brenda [name] is 61yo with cervical spondylosis, opioid use disorder, hypertension, hyperlipidemia admitted 1/4 with septic facet joint L2-L3 on IV antibiotics for six weeks through 05/27/2021.  Assessment & Plan:  Principal Problem:   Septic joint ([place]) Active Problems:   Essential hypertension   Opioid use disorder, moderate, dependence (HCC)   Protein-calorie malnutrition,  severe  #Septic arthritis of L2-L3 facet joint No acute changes thus far, blood cultures negative. Appreciate ID assistance. Not a candidate for PICC line, we will continue with peripheral IV. - IV vancomycin, ceftriaxone, end date 05/27/2021  #Opioid use disorder Continues to be on frequent doses of IV dilaudid. Today patient reports she is wanting to start suboxone on Monday. Will plan to d/c IV dilaudid Sunday night. - Acetaminophen 1000mg  three times daily - IV dilaudid 2mg  every 3 hours as needed - Robaxin 500mg  every 8 hours as needed  Best practice:  DIET: Regular IVF: n/a DVT PPX: Xarelto BOWEL: Senokot-S CODE: DNR FAM COM: Carney Living, MD Internal Medicine Resident PGY-2 PAGER: [phone] 04/26/2021 12:40 PM  If after hours (below), please contact on-call pager: [phone] 5PM-7AM Monday-Friday 1PM-7AM Saturday-Sunday

## 2021-04-26 NOTE — Hospital Course (Addendum)
Septic L2-L3 facet joint Chronic back pain MRSA skin pustules Mitral valve endocarditis Patient arrived to [facility] with acute on chronic back pain. Work-up notable for mild fever 100.4, mild leukocytosis, and elevated inflammatory markers. Imaging in the ED revealed irregularity of the left L2-L3 facet joint. IR was consulted for aspiration, but was unable to perform this given location and size of the joint. Discussed with infectious disease, who have recommended six weeks of IV antibiotic therapy with ceftriaxone and vancomycin. For the first few weeks of hospitalization, patient's pain was significant while attempting to transition of IV opioid medications. Thankfully, patient settled on a safe pain regimen that worked well through the rest of her hospitalization. During hospitalization, patient also developed pustules in bilateral lower extremities. These were treated with topical antibiotic. Fluid culture collected from these sites showed a negative gram stain and culture. A skin biopsy was obtained and showed culture with rare WBC but no organisms seen, no anaerobes isolated on culture; acid fast smear negative; acid fast culture had not yet resulted. A new murmur was auscultated 02/01 prompting a TEE 02/06 which showed mitral valve endocarditis, moderate central aortic regurgitation. She was able to be transitioned to oral antibiotics at discharge. Patient to follow-up with the infectious disease clinic two weeks after discharge.    Opioid use disorder On arrival patient reported significant polysubstance use. Patient did use IV opioid a few hours prior to arrival. On admission patient was beginning to experience some withdrawal symptoms, including nausea, vomiting, and diarrhea. She was previously on methadone, which worked well for her. Given acute infection, pain was adequately controlled with IV dilaudid every few hours. We also initiated multimodal approach to treat pain in order to de-escalate off  of opioid medications. After two weeks inpatient, we successfully transitioned Ms. Almquist over to suboxone 8-2mg  twice daily. Initially required IV toradol to help augment pain control during the transition, but was able to transition to full oral pain medications soon after this. At discharge, patient on Suboxone 8-2 mg BID.  Hyperlipidemia Patient's ASCVD risk factor calculated to be 7.9% while inpatient. She was started on atorvastatin 20 mg daily.  Hypertension Initially on arrival patient's blood pressure significantly elevated with systolics in [employer]. Slowly up-titrated oral medications for adequate control. Patient was normotensive on the following regimen: amlodipine 10mg  daily, HCTZ 6.25 daily. Patient will need continued follow-up with primary care office at discharge.   Myalgias During hospitalization, patient mentioned that she has been experiencing myalgias, especially while at rest. No evidence of peripheral vascular disease. Metabolic studies revealed deficiency in vitamin D, which could be contributing. In addition, patient has a history of cocaine use, which could be myotoxic. Patient to be discharge on oral iron and vitamin D supplementation. Will need follow-up with primary care office at discharge.   Normocytic anemia Iron studies 01/20 showed iron 56 with 16 percent saturation, ferritin of 28. She was started on iron 325 mg daily.

## 2021-04-27 DIAGNOSIS — M4650 Other infective spondylopathies, site unspecified: Secondary | ICD-10-CM | POA: Diagnosis not present

## 2021-04-27 MED ORDER — BUPRENORPHINE HCL-NALOXONE HCL 8-2 MG SL SUBL
0.5000 | SUBLINGUAL_TABLET | Freq: Once | SUBLINGUAL | Status: DC
  Filled 2021-04-27: qty 1

## 2021-04-27 NOTE — Plan of Care (Signed)
  Problem: Education: Goal: Knowledge of General Education information will improve Description Including pain rating scale, medication(s)/side effects and non-pharmacologic comfort measures Outcome: Progressing   

## 2021-04-27 NOTE — Progress Notes (Signed)
° °  NAME:  Brenda [name], MRN:  [id number], DOB:  24-May-1960, LOS: 11 ADMISSION DATE:  04/15/2021  Subjective  Patient evaluated at bedside this AM. Was able to get some sleep last night, pain remains well-controlled. Discussed initiating suboxone tomorrow.  Objective   Blood pressure (!) 133/59, pulse 87, temperature 98.2 F (36.8 C), temperature source Oral, resp. rate 16, height 5\' 4"  (1.626 m), weight 42.5 kg, last menstrual period 09/23/2007, SpO2 98 %.    No intake or output data in the 24 hours ending 04/27/21 0926 Filed Weights   04/16/21 1726 04/16/21 2350  Weight: 47.2 kg 42.5 kg   Physical Exam: General: Resting comfortably in bed, no acute distress Pulm: Normal work of breathing on room air. MSK: Normal bulk, tone. No lower extremity edema. Neuro: Awake, alert, conversing normally  Labs    CBC Latest Ref Rng & Units 04/25/2021 04/21/2021 04/18/2021  WBC 4.0 - 10.5 K/uL 9.4 9.6 11.4(H)  Hemoglobin 12.0 - 15.0 g/dL 11.6(L) 13.5 14.6  Hematocrit 36.0 - 46.0 % 36.2 40.5 43.8  Platelets 150 - 400 K/uL 282 307 318   BMP Latest Ref Rng & Units 04/25/2021 04/19/2021 04/18/2021  Glucose 70 - 99 mg/dL 123(H) 133(H) 129(H)  BUN 6 - 20 mg/dL 18 22(H) 18  Creatinine 0.44 - 1.00 mg/dL 0.70 0.69 0.72  BUN/Creat Ratio 9 - 23 - - -  Sodium 135 - 145 mmol/L 137 132(L) 132(L)  Potassium 3.5 - 5.1 mmol/L 4.4 3.8 3.5  Chloride 98 - 111 mmol/L 103 102 101  CO2 22 - 32 mmol/L 27 22 22   Calcium 8.9 - 10.3 mg/dL 9.1 9.1 9.2   Summary  Brenda [name] is 61yo with cervical spondylosis, opioid use disorder, hypertension, hyperlipidemia admitted 1/4 with septic facet joint L2-L3 on IV antibiotics for six weeks through 05/27/2021.  Assessment & Plan:  Principal Problem:   Septic joint ([place]) Active Problems:   Essential hypertension   Opioid use disorder, moderate, dependence (HCC)   Protein-calorie malnutrition, severe  #Septic L2-L3 facet joint No acute events, remains afebrile with good pain  control. Plan to continue IV antibiotics in hospital, last day 05/27/2021.  - IV vancomycin, ceftriaxone daily  #Opioid use disorder Discussed holding dilaudid overnight to initiate suboxone in AM, but patient would prefer to hold the dilaudid during the day tomorrow instead. Will plan to d/c IV dilaudid in AM. - Acetaminophen 1000mg  three times daily - IV dilaudid 2mg  every 3 hours as needed - Robaxin 500mg  every 8 hours as needed - Initiate suboxone tomorrow  Best practice:  DIET: Regular IVF: n/a DVT PPX: Xarelto BOWEL: Senokot-S CODE: DNR FAM COM: Carney Living, MD Internal Medicine Resident PGY-2 PAGER: [phone] 04/27/2021 9:26 AM  If after hours (below), please contact on-call pager: [phone] 5PM-7AM Monday-Friday 1PM-7AM Saturday-Sunday

## 2021-04-28 ENCOUNTER — Telehealth (HOSPITAL_COMMUNITY): Payer: Self-pay

## 2021-04-28 DIAGNOSIS — M4650 Other infective spondylopathies, site unspecified: Secondary | ICD-10-CM | POA: Diagnosis not present

## 2021-04-28 DIAGNOSIS — I10 Essential (primary) hypertension: Secondary | ICD-10-CM | POA: Diagnosis not present

## 2021-04-28 DIAGNOSIS — F1120 Opioid dependence, uncomplicated: Secondary | ICD-10-CM | POA: Diagnosis not present

## 2021-04-28 LAB — VANCOMYCIN, PEAK: Vancomycin Pk: 23 ug/mL — ABNORMAL LOW (ref 30–40)

## 2021-04-28 MED ORDER — BUPRENORPHINE HCL-NALOXONE HCL 2-0.5 MG SL SUBL
2.0000 | SUBLINGUAL_TABLET | Freq: Once | SUBLINGUAL | Status: AC
  Administered 2021-04-28: 2 via SUBLINGUAL
  Filled 2021-04-28: qty 2

## 2021-04-28 MED ORDER — BUPRENORPHINE HCL-NALOXONE HCL 2-0.5 MG SL SUBL
2.0000 | SUBLINGUAL_TABLET | Freq: Every day | SUBLINGUAL | Status: AC
  Administered 2021-04-28: 2 via SUBLINGUAL
  Filled 2021-04-28: qty 2

## 2021-04-28 MED ORDER — BUPRENORPHINE HCL-NALOXONE HCL 2-0.5 MG SL SUBL
2.0000 | SUBLINGUAL_TABLET | Freq: Every day | SUBLINGUAL | Status: DC
  Administered 2021-04-28: 2 via SUBLINGUAL
  Filled 2021-04-28: qty 2

## 2021-04-28 MED ORDER — LORAZEPAM 1 MG PO TABS
1.0000 mg | ORAL_TABLET | Freq: Four times a day (QID) | ORAL | Status: DC | PRN
  Administered 2021-04-28 – 2021-05-03 (×16): 1 mg via ORAL
  Filled 2021-04-28 (×16): qty 1

## 2021-04-28 MED ORDER — HYDROXYZINE HCL 25 MG PO TABS: 50.0000 mg | ORAL_TABLET | Freq: Three times a day (TID) | ORAL | Status: DC | PRN

## 2021-04-28 NOTE — Progress Notes (Signed)
RN in to give patient her 0600 medications. Patient upset and cursing.  "This is f**cking inhumane". RN asked patient what she was referring to. She stated that "my pain medicine was discontinued". Explained to the patient that she is transitioning to Suboxone this am. Patient continues to curse at the the situation and inquired about leaving AMA. I explained to the patient that I would contact the MD with her concerns. Also explained to the patient that her IV access/PICC line would need to be removed before she would leave. Patient verbalized understanding and allowed RN to initiate Vancomycin infusion. Dr. Posey Pronto paged via [facility] communication. Awaiting return call. Received call promptly. Discussed patient's frustrations and anger toward Dilaudid being discontinued. No new orders received at this time. Dr. Posey Pronto stated that if patient decides to leave AMA to notify team.

## 2021-04-28 NOTE — Plan of Care (Signed)

## 2021-04-28 NOTE — BH Assessment (Signed)
Care Management - Follow Up [facility] Discharges   Patient has been transferred to [place] on 04-15-21.

## 2021-04-28 NOTE — Progress Notes (Signed)
NAME:  Brenda [name], MRN:  [id number], DOB:  07-26-1960, LOS: 12 ADMISSION DATE:  04/15/2021  Subjective  Patient stated she was in pain. Her back was hurting more than previously but not as bad as it was on admission.  Interval: Patient still endorsed discomfort but stated the suboxone helped her pain but she was still in a lot of pain.  Objective   Blood pressure 112/66, pulse 79, temperature 98.5 F (36.9 C), temperature source Oral, resp. rate 17, height 5\' 4"  (1.626 m), weight 42.5 kg, last menstrual period 09/23/2007, SpO2 99 %.     Intake/Output Summary (Last 24 hours) at 04/28/2021 1204 Last data filed at 04/28/2021 1000 Gross per 24 hour  Intake 856.96 ml  Output --  Net 856.96 ml   Filed Weights   04/16/21 1726 04/16/21 2350  Weight: 47.2 kg 42.5 kg    Physical Exam General: Appeared in acute distress. Lungs: CTAB, no wheeze, rhonchi or rales.  Cardiovascular: Normal heart sounds.  Neuro: Alert and oriented. CN grossly intact Psych: Normal mood and normal affect   Labs    CBC Latest Ref Rng & Units 04/25/2021 04/21/2021 04/18/2021  WBC 4.0 - 10.5 K/uL 9.4 9.6 11.4(H)  Hemoglobin 12.0 - 15.0 g/dL 11.6(L) 13.5 14.6  Hematocrit 36.0 - 46.0 % 36.2 40.5 43.8  Platelets 150 - 400 K/uL 282 307 318   BMP Latest Ref Rng & Units 04/25/2021 04/19/2021 04/18/2021  Glucose 70 - 99 mg/dL 123(H) 133(H) 129(H)  BUN 6 - 20 mg/dL 18 22(H) 18  Creatinine 0.44 - 1.00 mg/dL 0.70 0.69 0.72  BUN/Creat Ratio 9 - 23 - - -  Sodium 135 - 145 mmol/L 137 132(L) 132(L)  Potassium 3.5 - 5.1 mmol/L 4.4 3.8 3.5  Chloride 98 - 111 mmol/L 103 102 101  CO2 22 - 32 mmol/L 27 22 22   Calcium 8.9 - 10.3 mg/dL 9.1 9.1 9.2   Summary  Brenda [name] is 61yo with cervical spondylosis, opioid use disorder, hypertension, hyperlipidemia admitted 1/4 with septic facet joint L2-L3 on IV antibiotics for six weeks through 05/27/2021.  Assessment & Plan:  Principal Problem:   Septic joint ([place]) Active Problems:    Essential hypertension   Opioid use disorder, moderate, dependence (HCC)   Protein-calorie malnutrition, severe  #Septic L2-L3 facet joint Seen on MRI. Patient on IV antibiotics, Ceftriaxone 2 g qd and Vancomycin 750 mg qd until 05/27/2021. NGTD on blood cultures. ID following. States her pain is overall well controlled. CBC shows improved leukocytosis and no leukopenia or neutropenia. Plan to monitor her CBC on a weekly basis unless clinically indicated earlier.  -Continue IV abx -Transitioning to Suboxone this am as stated below. -Scheduled tylenol 1000 mg TID -Robaxin 500 mg BID  #Opioid use disorder Patient transitioning to suboxone this am.  Transitioning to Suboxone this am. Received 1 dose at 8 am and another dose ordered at 11:45 am as patient stated her symptoms improved with Suboxone.  Plan for one more dose this evening and then 8 mg BID dosing starting tomorrow.  -IV ativan 1 mg q6 hrs for anxiety -Scheduled tylenol 1000 mg TID -Continue Suboxone   Hypertension BP was well controlled on current regimen. Some hypertension noted today with systolic in 916B which may be secondary to pain given the transition to the dilaudid. This resolved later during the day.  -Continue Amlodipine 10 mg qd, Hydralazine 100 mg TID, and Losartan 100 mg qd.    Best practice:  DIET: Regular IVF: n/a  DVT PPX: Xarelto BOWEL: Senokot-S CODE: DNR FAM COM: n/a  Idamae Schuller, MD [place]. [facility] Internal Medicine Residency, PGY-1   If after hours (below), please contact on-call pager: [phone] 5PM-7AM Monday-Friday 1PM-7AM Saturday-Sunday

## 2021-04-29 DIAGNOSIS — E43 Unspecified severe protein-calorie malnutrition: Secondary | ICD-10-CM | POA: Diagnosis not present

## 2021-04-29 DIAGNOSIS — F1120 Opioid dependence, uncomplicated: Secondary | ICD-10-CM | POA: Diagnosis not present

## 2021-04-29 DIAGNOSIS — M4650 Other infective spondylopathies, site unspecified: Secondary | ICD-10-CM | POA: Diagnosis not present

## 2021-04-29 DIAGNOSIS — F1193 Opioid use, unspecified with withdrawal: Secondary | ICD-10-CM

## 2021-04-29 LAB — CREATININE, SERUM
Creatinine, Ser: 0.6 mg/dL (ref 0.44–1.00)
GFR, Estimated: >60 mL/min (ref >60)

## 2021-04-29 LAB — VANCOMYCIN, TROUGH: Vancomycin Tr: 13 ug/mL — ABNORMAL LOW (ref 15–20)

## 2021-04-29 MED ORDER — METHOCARBAMOL 500 MG PO TABS
500.0000 mg | ORAL_TABLET | Freq: Three times a day (TID) | ORAL | Status: DC
  Administered 2021-04-29 – 2021-04-30 (×3): 500 mg via ORAL
  Filled 2021-04-29 (×3): qty 1

## 2021-04-29 MED ORDER — KETOROLAC TROMETHAMINE 15 MG/ML IJ SOLN
15.0000 mg | Freq: Once | INTRAMUSCULAR | Status: AC
  Administered 2021-04-29: 15 mg via INTRAVENOUS
  Filled 2021-04-29: qty 1

## 2021-04-29 MED ORDER — BUPRENORPHINE HCL-NALOXONE HCL 8-2 MG SL SUBL
1.0000 | SUBLINGUAL_TABLET | Freq: Two times a day (BID) | SUBLINGUAL | Status: DC
Start: 2021-04-29 — End: 2021-05-20
  Administered 2021-04-30 – 2021-05-20 (×41): 1 via SUBLINGUAL
  Filled 2021-04-29 (×42): qty 1

## 2021-04-29 MED ORDER — NICOTINE 7 MG/24HR TD PT24
7.0000 mg | MEDICATED_PATCH | Freq: Every day | TRANSDERMAL | Status: DC
  Administered 2021-04-29 – 2021-05-05 (×7): 7 mg via TRANSDERMAL
  Filled 2021-04-29 (×7): qty 1

## 2021-04-29 NOTE — Progress Notes (Signed)
NAME:  Brenda [name], MRN:  [id number], DOB:  [birth year], LOS: 110 ADMISSION DATE:  04/15/2021  Subjective  Upon entering the room, patient resting well comfortably on bed without any acute distress. Asked patient how she is doing, and she states her back pain is bothering her significantly. Counseled patient on using multimodal approach to treat her pain but patient became upset at discontinuation of her IV dilaudid. She states she would rather not take anything for her pain than Suboxone. The suboxone was sitting by bedside and patient refusing to take it. Counseled patient on some of her other medications for the back pain that are not being frequently including her robaxin which can help with her back. Patient states if she does not get dilaudid, she will "pull her IV and leave the hospital". Re-affirmed our goal with the patient to provide her with safe and effective care which requires a multimodal approach to her pain.    Objective   Blood pressure 128/71, pulse 60, temperature (!) 97.5 F (36.4 C), temperature source Oral, resp. rate 19, height 5\' 4"  (1.626 m), weight 42.5 kg, last menstrual period 09/23/2007, SpO2 100 %.     Intake/Output Summary (Last 24 hours) at 04/29/2021 0707 Last data filed at 04/28/2021 2008 Gross per 24 hour  Intake 728.08 ml  Output --  Net 728.08 ml    Filed Weights   04/16/21 1726 04/16/21 2350  Weight: 47.2 kg 42.5 kg    Physical Exam General: NAD Lungs: Normal work of breathing on RA.  Cardiovascular:   Normal heart sounds, 2+ radial pulses Neuro: Alert and oriented x4.  Psych: Normal mood, agitated affect  Labs    CBC Latest Ref Rng & Units 04/25/2021 04/21/2021 04/18/2021  WBC 4.0 - 10.5 K/uL 9.4 9.6 11.4(H)  Hemoglobin 12.0 - 15.0 g/dL 11.6(L) 13.5 14.6  Hematocrit 36.0 - 46.0 % 36.2 40.5 43.8  Platelets 150 - 400 K/uL 282 307 318   BMP Latest Ref Rng & Units 04/29/2021 04/25/2021 04/19/2021  Glucose 70 - 99 mg/dL - 123(H) 133(H)  BUN 6 - 20  mg/dL - 18 22(H)  Creatinine 0.44 - 1.00 mg/dL 0.60 0.70 0.69  BUN/Creat Ratio 9 - 23 - - -  Sodium 135 - 145 mmol/L - 137 132(L)  Potassium 3.5 - 5.1 mmol/L - 4.4 3.8  Chloride 98 - 111 mmol/L - 103 102  CO2 22 - 32 mmol/L - 27 22  Calcium 8.9 - 10.3 mg/dL - 9.1 9.1   Summary  Brenda [name] is 61yo with cervical spondylosis, opioid use disorder, hypertension, hyperlipidemia admitted 1/4 with septic facet joint L2-L3 on IV antibiotics for six weeks through 05/27/2021.  Assessment & Plan:  Principal Problem:   Septic joint ([place]) Active Problems:   Essential hypertension   Opioid use disorder, moderate, dependence (HCC)   Protein-calorie malnutrition, severe  #Septic L2-L3 facet joint Seen on MRI. Patient on IV antibiotics, Ceftriaxone 2 g qd and Vancomycin 750 mg qd until 05/27/2021. NGTD on blood cultures. ID following. CBC shows improved leukocytosis and no leukopenia or neutropenia. Plan to monitor her CBC on a weekly basis unless clinically indicated earlier. States pain is worsened since IV dilaudid has been discontinued and states the alternatives are not working. Given her general appearance, normal BP and heart rate point toward her hx opioid use disorder, see below.  -Continue IV abx -Continue Suboxone 8.2 mg BID -Scheduled tylenol 1000 mg TID -Will schedule Robaxin 500 mg TID; titrate up if needed  #  Opioid use disorder Transitioned to Suboxone yesterday. Patient not taking Suboxone stating it is not working. Counseled patient on allowing it more time to work and using multimodal approach to treat her pain. Patient was denying suboxone today stating that is an addiction as well and she does not want to be addicted to anything. Patient noted improvement with suboxone yesterday. She continued to ask specifically for IV dilaudid for her pain.  -Oral ativan for nausea and anxiety.  -Pain regimen including Suboxone as above  Hypertension BP was well controlled on current regimen.  She has been normotensive since yesterday -Continue Amlodipine 10 mg qd, Hydralazine 100 mg TID, and Losartan 100 mg qd.    Best practice:  DIET: Regular IVF: n/a DVT PPX: Xarelto BOWEL: Senokot-S CODE: DNR FAM COM: n/a  Idamae Schuller, MD [place]. [facility] Internal Medicine Residency, PGY-1   If after hours (below), please contact on-call pager: [phone] 5PM-7AM Monday-Friday 1PM-7AM Saturday-Sunday

## 2021-04-29 NOTE — Progress Notes (Signed)
Nutrition Follow-up  DOCUMENTATION CODES:   Underweight  INTERVENTION:   Continue Multivitamin w/ minerals daily Continue Ensure Enlive to po TID, each supplement provides 350 kcal and 20 grams of protein Recommend obtaining new weight. RN notified.   NUTRITION DIAGNOSIS:   Increased nutrient needs related to acute illness as evidenced by estimated needs. - Ongoing  GOAL:   Patient will meet greater than or equal to 90% of their needs - Progressing   MONITOR:   PO intake, Supplement acceptance, Weight trends  REASON FOR ASSESSMENT:   Other (Comment) (Low BMI)    ASSESSMENT:   61 y.o female presented to the ED with back pain, nausea, vomiting, and diarrhea. PMH includes polysubstance abuse, HTN, lung cancer, malnutrition, and GERD. Pt admitted with L2-L3 septic joint and possible gastritis.   Pt reports that she is doing good and eating well. Denies any nausea, vomiting, or diarrhea. Pt reports that she is able to order meals with no difficulty.   Pt reports that she is still drinking the 3 Ensures per day.   Pt requiring 6 weeks of IV antibiotics; will remain inpatient due to unsafe discharge.   Pt with no new weight since admission.  Medications reviewed and include: MVI, Protonix, IV antibiotics  Labs reviewed.   Diet Order:   Diet Order             Diet regular Room service appropriate? Yes; Fluid consistency: Thin  Diet effective now                   EDUCATION NEEDS:   No education needs have been identified at this time  Skin:  Skin Assessment: Reviewed RN Assessment  Last BM:  01/16  Height:   Ht Readings from Last 1 Encounters:  04/16/21 5\' 4"  (1.626 m)    Weight:   Wt Readings from Last 1 Encounters:  04/16/21 42.5 kg    Ideal Body Weight:  54.6 kg  BMI:  Body mass index is 16.08 kg/m.  Estimated Nutritional Needs:   Kcal:  1500-1700  Protein:  75-90 grams  Fluid:  >/= 1.5 L    Audray Rumore Louie Casa, RD, LDN Clinical  Dietitian See [facility] for contact information.

## 2021-04-29 NOTE — Progress Notes (Signed)
Pharmacy Antibiotic Note  Brenda [name] is a 61 y.o. female admitted on 04/15/2021 with  septic joint . Pharmacy consulted for vancomycin dosing.   1/17 AM update:  AUC is therapeutic Renal function stable  Plan: Cont vancomycin 750 mg IV q12h Re-check levels as needed  Height: 5\' 4"  (162.6 cm) Weight: 42.5 kg (93 lb 11.1 oz) IBW/kg (Calculated) : 54.7  Temp (24hrs), Avg:97.9 F (36.6 C), Min:97.5 F (36.4 C), Max:98.5 F (36.9 C)  Recent Labs  Lab 04/23/21 1135 04/24/21 0520 04/25/21 0835 04/28/21 2209 04/29/21 0500  WBC  --   --  9.4  --   --   CREATININE  --   --  0.70  --  0.60  VANCOTROUGH  --  4*  --   --  13*  VANCOPEAK  --   --   --  23*  --   VANCORANDOM 13  --   --   --   --      Estimated Creatinine Clearance: 50.8 mL/min (by C-G formula based on SCr of 0.6 mg/dL).    No Known Allergies  Narda Bonds, PharmD, BCPS Clinical Pharmacist Phone: [phone]

## 2021-04-30 DIAGNOSIS — I10 Essential (primary) hypertension: Secondary | ICD-10-CM | POA: Diagnosis not present

## 2021-04-30 DIAGNOSIS — M4650 Other infective spondylopathies, site unspecified: Secondary | ICD-10-CM | POA: Diagnosis not present

## 2021-04-30 DIAGNOSIS — F1120 Opioid dependence, uncomplicated: Secondary | ICD-10-CM | POA: Diagnosis not present

## 2021-04-30 DIAGNOSIS — E43 Unspecified severe protein-calorie malnutrition: Secondary | ICD-10-CM | POA: Diagnosis not present

## 2021-04-30 MED ORDER — METHOCARBAMOL 500 MG PO TABS
500.0000 mg | ORAL_TABLET | Freq: Three times a day (TID) | ORAL | Status: DC | PRN
  Administered 2021-05-02 – 2021-05-03 (×3): 500 mg via ORAL
  Filled 2021-04-30 (×3): qty 1

## 2021-04-30 MED ORDER — PREGABALIN 25 MG PO CAPS
50.0000 mg | ORAL_CAPSULE | Freq: Three times a day (TID) | ORAL | Status: DC
  Administered 2021-04-30 – 2021-05-07 (×22): 50 mg via ORAL
  Filled 2021-04-30 (×22): qty 2

## 2021-04-30 NOTE — Plan of Care (Signed)
  Problem: Pain Managment: Goal: General experience of comfort will improve Outcome: Progressing   Problem: Safety: Goal: Ability to remain free from injury will improve Outcome: Progressing   Problem: Skin Integrity: Goal: Risk for impaired skin integrity will decrease Outcome: Progressing   

## 2021-04-30 NOTE — Progress Notes (Addendum)
NAME:  Brenda [name], MRN:  [id number], DOB:  [birth year], LOS: 32 ADMISSION DATE:  04/15/2021  Subjective   Patient states she had a rough night due to pain. Feeling nauseous this morning, but feeling a little bit better after she took suboxone and ativan.   Objective   Blood pressure 131/71, pulse 69, temperature 98 F (36.7 C), temperature source Oral, resp. rate 18, height 5\' 4"  (1.626 m), weight 42.5 kg, last menstrual period 09/23/2007, SpO2 98 %.    No intake or output data in the 24 hours ending 04/30/21 0530  Filed Weights   04/16/21 1726 04/16/21 2350  Weight: 47.2 kg 42.5 kg    Physical Exam General: NAD Lungs: Normal work of breathing on RA.  Cardiovascular:   Normal heart sounds, 2+ radial pulses Neuro: Alert and oriented x4.  Psych: Normal mood, normal affect  Labs    CBC Latest Ref Rng & Units 04/25/2021 04/21/2021 04/18/2021  WBC 4.0 - 10.5 K/uL 9.4 9.6 11.4(H)  Hemoglobin 12.0 - 15.0 g/dL 11.6(L) 13.5 14.6  Hematocrit 36.0 - 46.0 % 36.2 40.5 43.8  Platelets 150 - 400 K/uL 282 307 318   BMP Latest Ref Rng & Units 04/29/2021 04/25/2021 04/19/2021  Glucose 70 - 99 mg/dL - 123(H) 133(H)  BUN 6 - 20 mg/dL - 18 22(H)  Creatinine 0.44 - 1.00 mg/dL 0.60 0.70 0.69  BUN/Creat Ratio 9 - 23 - - -  Sodium 135 - 145 mmol/L - 137 132(L)  Potassium 3.5 - 5.1 mmol/L - 4.4 3.8  Chloride 98 - 111 mmol/L - 103 102  CO2 22 - 32 mmol/L - 27 22  Calcium 8.9 - 10.3 mg/dL - 9.1 9.1   Summary  Brenda [name] is 61yo with cervical spondylosis, opioid use disorder, hypertension, hyperlipidemia admitted 1/4 with septic facet joint L2-L3 on IV antibiotics for six weeks through 05/27/2021.  Assessment & Plan:  Principal Problem:   Septic joint ([place]) Active Problems:   Essential hypertension   Opioid use disorder, moderate, dependence (HCC)   Protein-calorie malnutrition, severe   Opiate withdrawal (HCC)  #Septic L2-L3 facet joint Seen on MRI. Patient on IV antibiotics, Ceftriaxone 2 g  qd and Vancomycin 750 mg qd until 05/27/2021. NGTD on blood cultures. ID following. Previous CBC shows improved leukocytosis and no leukopenia or neutropenia. Plan to monitor her CBC on a weekly basis unless clinically indicated earlier. Patient has neuropathy 2/2 chronic back pain. Will initiate Lyrica and up-titrate as needed.  -Continue IV abx -Continue Suboxone 8.2 mg BID -Scheduled tylenol 1000 mg TID -Will schedule Robaxin 500 mg TID; titrate up if needed -Start lyrica 50 mg TID -Will try to avoid NSAIDs due to her gastritis.   #Opioid use disorder Transitioned to Suboxone yesterday. Initially patient did not take Suboxone stating she would only take dilaudid for pain but started taking it this morning and states it is helping her pain.  -Oral 1 mg ativan q6hrs for nausea and anxiety. Will wean down as tolerated since pt taking Suboxone.  -Pain regimen including Suboxone as above  #Hypertension BP was well controlled on current regimen. Blood pressures have been normotensive -Continue Amlodipine 10 mg qd, Hydralazine 100 mg TID, and Losartan 100 mg qd.    Best practice:  DIET: Regular IVF: n/a DVT PPX: Xarelto BOWEL: Senokot-S CODE: DNR FAM COM: n/a  Idamae Schuller, MD [place]. [facility] Internal Medicine Residency, PGY-1   If after hours (below), please contact on-call pager: [phone] 5PM-7AM Monday-Friday 1PM-7AM  Saturday-Sunday

## 2021-05-01 DIAGNOSIS — F1120 Opioid dependence, uncomplicated: Secondary | ICD-10-CM | POA: Diagnosis not present

## 2021-05-01 DIAGNOSIS — M4650 Other infective spondylopathies, site unspecified: Secondary | ICD-10-CM | POA: Diagnosis not present

## 2021-05-01 DIAGNOSIS — E43 Unspecified severe protein-calorie malnutrition: Secondary | ICD-10-CM | POA: Diagnosis not present

## 2021-05-01 DIAGNOSIS — I10 Essential (primary) hypertension: Secondary | ICD-10-CM | POA: Diagnosis not present

## 2021-05-01 MED ORDER — ZOLPIDEM TARTRATE 5 MG PO TABS
5.0000 mg | ORAL_TABLET | Freq: Every day | ORAL | Status: DC
  Administered 2021-05-01 – 2021-05-19 (×19): 5 mg via ORAL
  Filled 2021-05-01 (×19): qty 1

## 2021-05-01 MED ORDER — KETOROLAC TROMETHAMINE 15 MG/ML IJ SOLN
15.0000 mg | Freq: Once | INTRAMUSCULAR | Status: AC
  Administered 2021-05-01: 15 mg via INTRAVENOUS
  Filled 2021-05-01: qty 1

## 2021-05-01 NOTE — Plan of Care (Signed)

## 2021-05-01 NOTE — Progress Notes (Signed)
NAME:  Brenda [name], MRN:  [id number], DOB:  [birth year], LOS: 90 ADMISSION DATE:  04/15/2021 Brenda [name] is [phone] with cervical spondylosis, opioid use disorder, hypertension, hyperlipidemia admitted 1/4 with septic facet joint L2-L3 on IV antibiotics for six weeks through 05/27/2021.  Subjective  She states that she had a few abscesses on her leg that opened yesterday and the nurses had to clean them out with saline. They had brown drainage and opened on their own. State they appeared suddenly and were not present previously. The patient notes she has a problem with her muscles when she walks and that her muscles in her legs burn when she walks. She used to be a ballerina for 11 years. She gets a lot of cramping in her legs.   Objective: Afebrile, normotensive, satting well on RA.   Blood pressure 113/60, pulse 77, temperature 98.4 F (36.9 C), temperature source Oral, resp. rate 18, height 5\' 4"  (1.626 m), weight 42.5 kg, last menstrual period 09/23/2007, SpO2 97 %.    No intake or output data in the 24 hours ending 05/01/21 0547  Filed Weights   04/16/21 1726 04/16/21 2350  Weight: 47.2 kg 42.5 kg    Physical Exam General: NAD Lungs:  Normal work of breathing on RA. No accessory muscle use.  Cardiovascular:   Normal heart sounds, 2+ pulses MSK: normal bulk and tone. No edema on the LE. Skin: Patient has multiple lesions consistent with appearance with a boil, bilateral LE proximal to patella. No drainage.  Neuro: alert and oriented x4 Psych: Normal mood and normal affect  Labs   No recent labs  Summary  Brenda [name] is 61yo with cervical spondylosis, opioid use disorder, hypertension, hyperlipidemia admitted 1/4 with septic facet joint L2-L3 on IV antibiotics for six weeks through 05/27/2021.  Assessment & Plan:  Principal Problem:   Septic joint ([place]) Active Problems:   Essential hypertension   Opioid use disorder, moderate, dependence (HCC)   Protein-calorie malnutrition,  severe   Opiate withdrawal (HCC)  #Septic L2-L3 facet joint #Chronic back pain Seen on MRI. Patient on IV antibiotics, Ceftriaxone 2 g qd and Vancomycin 750 mg qd until 05/27/2021. NGTD on blood cultures. ID following. Previous CBC shows improved leukocytosis and no leukopenia or neutropenia. Plan to monitor her CBC on a weekly basis unless clinically indicated earlier.  -Continue IV abx -Continue Suboxone 8.2 mg BID -Scheduled tylenol 1000 mg TID -Will schedule Robaxin 500 mg TID; titrate up if needed -Continue lyrica 50 mg TID -Will try to avoid NSAIDs due to her gastritis. -CBC tomorrow am   #Opioid use disorder Patient on Suboxone since 04/29/21. Appears to be tolerating it well.  -Oral 1 mg ativan q6hrs for nausea and anxiety.  -Will wean down as tolerated since pt taking Suboxone.  -Pain regimen including Suboxone as above  #Hypertension BP was well controlled on current regimen. Blood pressures have been normotensive -Continue Amlodipine 10 mg qd, Hydralazine 100 mg TID, and Losartan 100 mg qd.   #Myalgias States patient both her leg burns after exertion. This is reproducible with activity. She states she had this problem before getting admitted. CK on 04/18/21 was normal. This could be 2/2 to her IV drug use vs deconditioning vs electrolyte abnormalities. Previous BMP and magnesium were wnl. Cocaine is directly myotoxic and given her initial UDS which was cocaine positive, and normal electrolytes, points to her cocaine use as the likely cause.   -CTM   Best practice:  DIET: Regular IVF: n/a  DVT PPX: Xarelto 10 mg BOWEL: Senokot-S CODE: DNR FAM COM: n/a  Brenda Schuller, MD [place]. [facility] Internal Medicine Residency, PGY-1   If after hours (below), please contact on-call pager: [phone] 5PM-7AM Monday-Friday 1PM-7AM Saturday-Sunday

## 2021-05-02 LAB — BASIC METABOLIC PANEL
Anion gap: 6 (ref 5–15)
BUN: 38 mg/dL — ABNORMAL HIGH (ref 6–20)
CO2: 26 mmol/L (ref 22–32)
Calcium: 9.2 mg/dL (ref 8.9–10.3)
Chloride: 105 mmol/L (ref 98–111)
Creatinine, Ser: 0.73 mg/dL (ref 0.44–1.00)
GFR, Estimated: >60 mL/min (ref >60)
Glucose, Bld: 97 mg/dL (ref 70–99)
Potassium: 4.5 mmol/L (ref 3.5–5.1)
Sodium: 137 mmol/L (ref 135–145)

## 2021-05-02 LAB — CBC WITH DIFFERENTIAL/PLATELET
Abs Immature Granulocytes: 0.02 10*3/uL (ref 0.00–0.07)
Basophils Absolute: 0.1 10*3/uL (ref 0.0–0.1)
Basophils Relative: 2 %
Eosinophils Absolute: 0.7 10*3/uL — ABNORMAL HIGH (ref 0.0–0.5)
Eosinophils Relative: 9 %
HCT: 32.2 % — ABNORMAL LOW (ref 36.0–46.0)
Hemoglobin: 10.2 g/dL — ABNORMAL LOW (ref 12.0–15.0)
Immature Granulocytes: 0 %
Lymphocytes Relative: 38 %
Lymphs Abs: 3 10*3/uL (ref 0.7–4.0)
MCH: 28.8 pg (ref 26.0–34.0)
MCHC: 31.7 g/dL (ref 30.0–36.0)
MCV: 91 fL (ref 80.0–100.0)
Monocytes Absolute: 0.6 10*3/uL (ref 0.1–1.0)
Monocytes Relative: 8 %
Neutro Abs: 3.4 10*3/uL (ref 1.7–7.7)
Neutrophils Relative %: 43 %
Platelets: 259 10*3/uL (ref 150–400)
RBC: 3.54 MIL/uL — ABNORMAL LOW (ref 3.87–5.11)
RDW: 14.6 % (ref 11.5–15.5)
WBC: 7.8 10*3/uL (ref 4.0–10.5)
nRBC: 0 % (ref 0.0–0.2)

## 2021-05-02 LAB — FERRITIN: Ferritin: 28 ng/mL (ref 11–307)

## 2021-05-02 LAB — IRON AND TIBC
Iron: 56 ug/dL (ref 28–170)
Saturation Ratios: 16 % (ref 10.4–31.8)
TIBC: 347 ug/dL (ref 250–450)
UIBC: 291 ug/dL

## 2021-05-02 LAB — VITAMIN D 25 HYDROXY (VIT D DEFICIENCY, FRACTURES): Vit D, 25-Hydroxy: 24.2 ng/mL — ABNORMAL LOW (ref 30–100)

## 2021-05-02 MED ORDER — PANTOPRAZOLE SODIUM 40 MG PO TBEC
40.0000 mg | DELAYED_RELEASE_TABLET | Freq: Two times a day (BID) | ORAL | Status: DC
  Administered 2021-05-02 – 2021-05-20 (×36): 40 mg via ORAL
  Filled 2021-05-02 (×36): qty 1

## 2021-05-02 MED ORDER — CELECOXIB 200 MG PO CAPS: 400.0000 mg | ORAL_CAPSULE | Freq: Once | ORAL | Status: DC

## 2021-05-02 MED ORDER — CELECOXIB 200 MG PO CAPS
400.0000 mg | ORAL_CAPSULE | Freq: Once | ORAL | Status: AC
  Administered 2021-05-02: 400 mg via ORAL
  Filled 2021-05-02: qty 2

## 2021-05-02 NOTE — Progress Notes (Addendum)
Pharmacy Antibiotic Note  Brenda [name] is a 61 y.o. female admitted on 04/15/2021 with  septic joint . Pharmacy consulted for vancomycin dosing.  05/02/21 Update:  WBC 7.8, afebrile   SCr 0.73 renal function is stable   Antibiotics:  Vanc 01/04 >> (2/14) CTX  01/04 >> (2/14)   Levels /AUC and dose adjustments for Vanc: 04/23/21: VP 13, VT 4 AUC 223 -> on 750 q24h>  adjust dose to 750 mg q12h  1/17- VP 23, VT 13, AUC:482, cont 750 q12h   Microbiology: 1/04 Bcx: Negative  1/05 Hepatitis w/u: immunity to a b &c  Plan: Continue vancomycin 750 mg IV q12h Re-check Vancomycin trough level weekly (Tue) Scr and VT, next due 1/24 0530  x 3 weeks   Height: 5\' 4"  (162.6 cm) Weight: 42.5 kg (93 lb 11.1 oz) IBW/kg (Calculated) : 54.7  Temp (24hrs), Avg:98.3 F (36.8 C), Min:97.6 F (36.4 C), Max:98.6 F (37 C)  Recent Labs  Lab 04/28/21 2209 04/29/21 0500 05/02/21 0358  WBC  --   --  7.8  CREATININE  --  0.60 0.73  VANCOTROUGH  --  13*  --   VANCOPEAK 23*  --   --      Estimated Creatinine Clearance: 50.8 mL/min (by C-G formula based on SCr of 0.73 mg/dL).    No Known Allergies   Thank you for allowing pharmacy to be part of this patients care team. Nicole Cella, [place] Pharmacist [phone] 05/02/2021 3:36 PM  Please check AMION for all [place] phone numbers After 10:00 PM, call [place]

## 2021-05-02 NOTE — Progress Notes (Addendum)
NAME:  BRALEIGH MASSOUD, MRN:  [id number], DOB:  March 11, 1961, LOS: 27 ADMISSION DATE:  04/15/2021 [name] is [phone] with cervical spondylosis, opioid use disorder, hypertension, hyperlipidemia admitted 1/4 with septic facet joint L2-L3 on IV antibiotics for six weeks through 05/27/2021.  Subjective   Patient states she is having a lot of back pain this morning and feels uncomfortable. She states the tramadol is helping her.  Objective: Afebrile, normotensive, satting well on RA.   Blood pressure (!) 102/54, pulse 71, temperature 98.4 F (36.9 C), temperature source Oral, resp. rate 17, height 5\' 4"  (1.626 m), weight 42.5 kg, last menstrual period 09/23/2007, SpO2 95 %.    No intake or output data in the 24 hours ending 05/02/21 0536  Filed Weights   04/16/21 1726 04/16/21 2350  Weight: 47.2 kg 42.5 kg    Physical Exam General: NAD Lungs:  Normal work of breathing, no respiratory distress Cardiovascular:   Normal heart sounds Abdomen: Normal bowel sounds, TTP in epigastric area MSK: normal bulk and tone.  Neuro: alert and oriented x4 Psych: normal mood and normal affect  Labs   CBC CBC Latest Ref Rng & Units 05/02/2021 04/25/2021 04/21/2021  WBC 4.0 - 10.5 K/uL 7.8 9.4 9.6  Hemoglobin 12.0 - 15.0 g/dL 10.2(L) 11.6(L) 13.5  Hematocrit 36.0 - 46.0 % 32.2(L) 36.2 40.5  Platelets 150 - 400 K/uL 259 282 307    BMP Latest Ref Rng & Units 05/02/2021 04/29/2021 04/25/2021  Glucose 70 - 99 mg/dL 97 - 123(H)  BUN 6 - 20 mg/dL 38(H) - 18  Creatinine 0.44 - 1.00 mg/dL 0.73 0.60 0.70  BUN/Creat Ratio 9 - 23 - - -  Sodium 135 - 145 mmol/L 137 - 137  Potassium 3.5 - 5.1 mmol/L 4.5 - 4.4  Chloride 98 - 111 mmol/L 105 - 103  CO2 22 - 32 mmol/L 26 - 27  Calcium 8.9 - 10.3 mg/dL 9.2 - 9.1      Summary  Anyssa Sharpless is 61yo with cervical spondylosis, opioid use disorder, hypertension, hyperlipidemia admitted 1/4 with septic facet joint L2-L3 on IV antibiotics for six weeks through  05/27/2021.  Assessment & Plan:  Principal Problem:   Septic joint ([place]) Active Problems:   Essential hypertension   Opioid use disorder, moderate, dependence (HCC)   Protein-calorie malnutrition, severe   Opiate withdrawal (HCC)  #Septic L2-L3 facet joint #Chronic back pain Seen on MRI. Patient on IV antibiotics, Ceftriaxone 2 g qd and Vancomycin 750 mg qd until 05/27/2021. NGTD on blood cultures. ID following. CBC today shows improved leukocytosis and no leukopenia or neutropenia. Plan to monitor her CBC on a weekly basis unless clinically indicated earlier.  -Continue IV abx -Continue Suboxone 8.2 mg BID -Scheduled tylenol 1000 mg TID -Will schedule Robaxin 500 mg TID; titrate up if needed -Continue lyrica 50 mg TID -Will try to avoid NSAIDs due to her gastritis.   #Opioid use disorder Patient on Suboxone since 04/29/21. Appears to be tolerating it well.  -Oral 1 mg ativan q6hrs for nausea and anxiety.  -Will wean down as tolerated since pt taking Suboxone.  -Pain regimen including Suboxone as above  #Gastritis #Elevated BUN #Normocytic anemia Hgb 10.2. MCV 91. Hgb decreased from last week. Iron check and lvl is 56 with 16 percent saturation, ferritin of 28. With leg cramps will have goal of ferritin of 70. Given drop hgb and increase in BUN , suspecting some gastric irritation. No blood in bowel movements per patient.  -Increase Protonix  40 mg BID -CTM hgb -Will consider oral iron supplementation to replenish iron stores.   #Hypertension BP was well controlled on current regimen. Blood pressures have been normotensive -Continue Amlodipine 10 mg qd, Hydralazine 100 mg TID, and Losartan 100 mg qd.   #Myalgias States patient both her leg burns after exertion. This is reproducible with activity. She states she had this problem before getting admitted. CK on 04/18/21 was normal. This could be 2/2 to her IV drug use vs deconditioning vs electrolyte abnormalities. Previous BMP and  magnesium were wnl. Cocaine is directly myotoxic and given her initial UDS which was cocaine positive, and normal electrolytes, points to her cocaine use as the likely cause.   -CTM   Best practice:  DIET: Regular IVF: n/a DVT PPX: Xarelto 10 mg BOWEL: Senokot-S CODE: DNR FAM COM: n/a  Idamae Schuller, MD [place]. [facility] Internal Medicine Residency, PGY-1   If after hours (below), please contact on-call pager: [phone] 5PM-7AM Monday-Friday 1PM-7AM Saturday-Sunday

## 2021-05-02 NOTE — Plan of Care (Signed)

## 2021-05-03 MED ORDER — METHOCARBAMOL 500 MG PO TABS
500.0000 mg | ORAL_TABLET | Freq: Three times a day (TID) | ORAL | Status: DC
  Administered 2021-05-03 – 2021-05-20 (×51): 500 mg via ORAL
  Filled 2021-05-03 (×51): qty 1

## 2021-05-03 MED ORDER — CELECOXIB 200 MG PO CAPS: 400.0000 mg | ORAL_CAPSULE | Freq: Two times a day (BID) | ORAL | Status: DC

## 2021-05-03 MED ORDER — VITAMIN D 25 MCG (1000 UNIT) PO TABS
2000.0000 [IU] | ORAL_TABLET | Freq: Every day | ORAL | Status: DC
  Administered 2021-05-03 – 2021-05-20 (×17): 2000 [IU] via ORAL
  Filled 2021-05-03 (×17): qty 2

## 2021-05-03 MED ORDER — CELECOXIB 200 MG PO CAPS
200.0000 mg | ORAL_CAPSULE | Freq: Two times a day (BID) | ORAL | Status: DC
  Administered 2021-05-03 – 2021-05-20 (×34): 200 mg via ORAL
  Filled 2021-05-03 (×35): qty 1

## 2021-05-03 NOTE — Progress Notes (Signed)
Patient noted to have 2 small wounds on each thigh. Left lateral upper thigh drainage noted wound 1 cm X 1 cm X 0.5 cm  cleansed and covered with foam.  Lower left wound 1 cm X 1 cm X 0 cm cleansed and covered with foam . Right later upper thigh  .9 cm X  .9  cm  X 0 cm cleansed and cover with foam. Right lower later thigh 1 cm X 1 cm X 0 cm cleansed and covered with foam patient tolerated  well.

## 2021-05-03 NOTE — Progress Notes (Addendum)
NAME:  Brenda [name], MRN:  [id number], DOB:  [birth year], LOS: 42 ADMISSION DATE:  04/15/2021 Brenda [name] is [phone] with cervical spondylosis, opioid use disorder, hypertension, hyperlipidemia admitted 1/4 with septic facet joint L2-L3 on IV antibiotics for six weeks through 05/27/2021.  Subjective  Stated the back pain was bothering her. She stated the Celebrex did not help her with the pain. No other acute complaints.    Objective: Afebrile, normotensive, satting well on RA.   Blood pressure (!) 109/53, pulse 75, temperature 98.3 F (36.8 C), temperature source Oral, resp. rate 18, height 5\' 4"  (1.626 m), weight 42.5 kg, last menstrual period 09/23/2007, SpO2 97 %.     Intake/Output Summary (Last 24 hours) at 05/03/2021 6433 Last data filed at 05/03/2021 0300 Gross per 24 hour  Intake 2331.89 ml  Output --  Net 2331.89 ml    Filed Weights   04/16/21 1726 04/16/21 2350  Weight: 47.2 kg 42.5 kg    Physical Exam  General: NAD Lungs:  Normal work of breathing, no respiratory distress Cardiovascular:   Normal heart sounds Abdomen: Normal bowel sounds, TTP in epigastric area MSK: normal bulk and tone.  Neuro: alert and oriented x4 Psych: normal mood and normal affect  Labs   No recent labs  Summary  Brenda [name] is 61yo with cervical spondylosis, opioid use disorder, hypertension, hyperlipidemia admitted 1/4 with septic facet joint L2-L3 on IV antibiotics for six weeks through 05/27/2021.  Assessment & Plan:  Principal Problem:   Septic joint ([place]) Active Problems:   Essential hypertension   Opioid use disorder, moderate, dependence (HCC)   Protein-calorie malnutrition, severe   Opiate withdrawal (HCC)  #Septic L2-L3 facet joint #Chronic back pain Seen on MRI. Patient on IV antibiotics, Ceftriaxone 2 g qd and Vancomycin 750 mg qd until 05/27/2021. NGTD on blood cultures. ID following. CBC today shows improved leukocytosis and no leukopenia or neutropenia. Plan to monitor  her CBC on a weekly basis unless clinically indicated earlier. Pain still present, so will increase her regimen.  -Continue IV abx -Continue Suboxone 8.2 mg BID -Scheduled tylenol 1000 mg TID, Scheduled Celebrex 200 mg BID. -Schedule Robaxin 500 mg TID; titrate up if needed daily limit 4g/day -Continue lyrica 50 mg TID, will consider increasing to 75 mg TID if pain continues tomorrow.  -Will try to avoid other NSAIDs due to her gastritis.   #Opioid use disorder Patient on Suboxone since 04/29/21. Appears to be tolerating it well.  -Oral 1 mg ativan q6hrs for nausea and anxiety.  -Will wean down as tolerated since pt taking Suboxone.  -Pain regimen including Suboxone as above  #Gastritis #Elevated BUN #Normocytic anemia Hgb 10.2. MCV 91. Hgb decreased from last week. Iron check and lvl is 56 with 16 percent saturation, ferritin of 28. With leg cramps will have goal of ferritin of 70. Given drop hgb and increase in BUN , suspecting some gastric irritation. No blood in bowel movements per patient.  -Increase Protonix 40 mg BID -CTM hgb; next CBC scheduled for tmrw -Will consider oral iron supplementation to replenish iron stores.   #Hypertension BP was well controlled on current regimen. Blood pressures have been normotensive -Continue Amlodipine 10 mg qd, Hydralazine 100 mg TID, and Losartan 100 mg qd.   #Myalgias States patient both her leg burns after exertion. This is reproducible with activity. She states she had this problem before getting admitted. CK on 04/18/21 was normal. This could be 2/2 to her IV drug use vs deconditioning vs  electrolyte abnormalities. Previous BMP and magnesium were wnl. Cocaine is directly myotoxic and given her initial UDS which was cocaine positive, and normal electrolytes, points to her cocaine use as the likely cause.   -CTM   Best practice:  DIET: Regular IVF: n/a DVT PPX: Xarelto 10 mg BOWEL: Senokot-S CODE: DNR FAM COM: n/a  Idamae Schuller,  MD [place]. [facility] Internal Medicine Residency, PGY-1   If after hours (below), please contact on-call pager: [phone] 5PM-7AM Monday-Friday 1PM-7AM Saturday-Sunday

## 2021-05-04 LAB — CBC WITH DIFFERENTIAL/PLATELET
Abs Immature Granulocytes: 0.02 10*3/uL (ref 0.00–0.07)
Basophils Absolute: 0.1 10*3/uL (ref 0.0–0.1)
Basophils Relative: 1 %
Eosinophils Absolute: 0.9 10*3/uL — ABNORMAL HIGH (ref 0.0–0.5)
Eosinophils Relative: 11 %
HCT: 32.4 % — ABNORMAL LOW (ref 36.0–46.0)
Hemoglobin: 10.5 g/dL — ABNORMAL LOW (ref 12.0–15.0)
Immature Granulocytes: 0 %
Lymphocytes Relative: 36 %
Lymphs Abs: 3 10*3/uL (ref 0.7–4.0)
MCH: 29.7 pg (ref 26.0–34.0)
MCHC: 32.4 g/dL (ref 30.0–36.0)
MCV: 91.5 fL (ref 80.0–100.0)
Monocytes Absolute: 0.6 10*3/uL (ref 0.1–1.0)
Monocytes Relative: 7 %
Neutro Abs: 3.7 10*3/uL (ref 1.7–7.7)
Neutrophils Relative %: 45 %
Platelets: 241 10*3/uL (ref 150–400)
RBC: 3.54 MIL/uL — ABNORMAL LOW (ref 3.87–5.11)
RDW: 14.6 % (ref 11.5–15.5)
WBC: 8.2 10*3/uL (ref 4.0–10.5)
nRBC: 0 % (ref 0.0–0.2)

## 2021-05-04 LAB — BASIC METABOLIC PANEL
Anion gap: 7 (ref 5–15)
BUN: 35 mg/dL — ABNORMAL HIGH (ref 6–20)
CO2: 25 mmol/L (ref 22–32)
Calcium: 9.1 mg/dL (ref 8.9–10.3)
Chloride: 106 mmol/L (ref 98–111)
Creatinine, Ser: 0.74 mg/dL (ref 0.44–1.00)
GFR, Estimated: >60 mL/min (ref >60)
Glucose, Bld: 107 mg/dL — ABNORMAL HIGH (ref 70–99)
Potassium: 4.3 mmol/L (ref 3.5–5.1)
Sodium: 138 mmol/L (ref 135–145)

## 2021-05-04 MED ORDER — HYDROXYZINE HCL 25 MG PO TABS: 100.0000 mg | ORAL_TABLET | Freq: Three times a day (TID) | ORAL | Status: DC | PRN

## 2021-05-04 MED ORDER — HYDROXYZINE HCL 25 MG PO TABS
50.0000 mg | ORAL_TABLET | Freq: Three times a day (TID) | ORAL | Status: DC
  Administered 2021-05-04 – 2021-05-20 (×46): 50 mg via ORAL
  Filled 2021-05-04 (×46): qty 2

## 2021-05-04 MED ORDER — HYDROXYZINE HCL 25 MG PO TABS: 50.0000 mg | ORAL_TABLET | Freq: Three times a day (TID) | ORAL | Status: DC | PRN

## 2021-05-04 MED ORDER — LORAZEPAM 1 MG PO TABS
1.0000 mg | ORAL_TABLET | Freq: Two times a day (BID) | ORAL | Status: DC | PRN
  Administered 2021-05-04 – 2021-05-06 (×2): 1 mg via ORAL
  Filled 2021-05-04 (×2): qty 1

## 2021-05-04 MED ORDER — LORAZEPAM 1 MG PO TABS: 1.0000 mg | ORAL_TABLET | Freq: Three times a day (TID) | ORAL | Status: DC | PRN

## 2021-05-04 NOTE — Progress Notes (Signed)
NAME:  Brenda [name], MRN:  [id number], DOB:  05-23-1960, LOS: 73 ADMISSION DATE:  04/15/2021  Subjective  Patient evaluated at bedside this AM. Reports continued pain, otherwise no new issues.  Objective   Blood pressure 118/68, pulse 72, temperature 98.2 F (36.8 C), temperature source Oral, resp. rate 18, height 5\' 4"  (1.626 m), weight 42.5 kg, last menstrual period 09/23/2007, SpO2 97 %.     Intake/Output Summary (Last 24 hours) at 05/04/2021 1115 Last data filed at 05/04/2021 0708 Gross per 24 hour  Intake 720 ml  Output --  Net 720 ml   Filed Weights   04/16/21 1726 04/16/21 2350  Weight: 47.2 kg 42.5 kg   Physical Exam: General: Resting comfortably in bed, no acute distress Pulm: Normal work of breathing on room air. Neuro: Awake, alert, conversing appropriately. Psych: Slightly slurred speech, incongruent affect  Labs    CBC Latest Ref Rng & Units 05/04/2021 05/02/2021 04/25/2021  WBC 4.0 - 10.5 K/uL 8.2 7.8 9.4  Hemoglobin 12.0 - 15.0 g/dL 10.5(L) 10.2(L) 11.6(L)  Hematocrit 36.0 - 46.0 % 32.4(L) 32.2(L) 36.2  Platelets 150 - 400 K/uL 241 259 282   BMP Latest Ref Rng & Units 05/04/2021 05/02/2021 04/29/2021  Glucose 70 - 99 mg/dL 107(H) 97 -  BUN 6 - 20 mg/dL 35(H) 38(H) -  Creatinine 0.44 - 1.00 mg/dL 0.74 0.73 0.60  BUN/Creat Ratio 9 - 23 - - -  Sodium 135 - 145 mmol/L 138 137 -  Potassium 3.5 - 5.1 mmol/L 4.3 4.5 -  Chloride 98 - 111 mmol/L 106 105 -  CO2 22 - 32 mmol/L 25 26 -  Calcium 8.9 - 10.3 mg/dL 9.1 9.2 -   Summary  Brenda [name] is 60yo with cervical spondylosis, opioid use disorder, hypertension, hyperlipidemia admitted 1/4 with septic facet joint L2-L3 on 6-week course of IV antibiotics given not a candidate for PICC.   Assessment & Plan:  Principal Problem:   Septic joint ([place]) Active Problems:   Essential hypertension   Opioid use disorder, moderate, dependence (HCC)   Protein-calorie malnutrition, severe   Opiate withdrawal (HCC)  #Septic  L2-L3 facet joint #Opioid use disorder Continuing with IV antibiotics through 05/27/2021. Back pain continues to be the main discussion with the patient. Over the last few days, patient has been more agreeable to suboxone although continues to report she feels like we transitioned off of the IV dilaudid too early. Will try to adjust medications for optimal multi-modal pain regimen while avoiding opioid medications. Initiated celecoxib over last few days, will need to attempt for short course given risk of gastritis. Currently she is on PPI therapy. In addition, we will avoid toradol as patient was becoming dependent on these injections, which is also not a long-term option. Please note, patient has asked multiple times to get fresh air and have her friend push her around in a wheelchair around the premises. However, we will not be ordering off-unit privileges at this time.  - IV ceftriaxone, vancomycin through 05/27/21 - Suboxone 8-2mg  twice daily - Acetaminophen 1000mg  three times daily - Celecoxib 200mg  twice daily - Pregabalin 50mg  three times daily - Methocarbamol 500mg  three times daily - Hold & Call MD if SBP<90, HR<65, RR<10, O2<90, or altered mental status - Restrict visitors, no off-unit privileges  #Anxiety Patient was initially prescribed IV Ativan for nausea and vomiting given prolonged QTc at >600. This was being given regularly for her anxiety as well. Patient is not on any anxiety-related medications at  home. It would not be appropriate to continue with Ativan multiples times daily over 6 weeks period for anxiety. We have now transitioned to oral Ativan and over the next upcoming days, plan to gradually wean off. Will discuss with Brenda [name] initiating SSRI. - PO lorazepam 1mg  twice daily as needed  - Hold & Call MD if SBP<90, HR<65, RR<10, O2<90, or altered mental status - Wean off lorazepam over next few days - Hydroxyzine 50mg  three times daily  #Myalgias Iron studies unrevealing  from 1/20. Patient does have a vitamin D deficiency, which could be contributing to her lower extremity myalgias. - Vitamin D3 2000U daily  Best practice:  DIET: Regular IVF: n/a DVT PPX: Xarelto BOWEL: Senokot CODE: DNR FAM COM: n/a  Sanjuan Dame, MD Internal Medicine Resident PGY-2 PAGER: [phone] 05/04/2021 11:15 AM  If after hours (below), please contact on-call pager: [phone] 5PM-7AM Monday-Friday 1PM-7AM Saturday-Sunday

## 2021-05-05 ENCOUNTER — Inpatient Hospital Stay: Payer: Medicaid Other | Admitting: Internal Medicine

## 2021-05-05 MED ORDER — KETOROLAC TROMETHAMINE 15 MG/ML IJ SOLN
15.0000 mg | Freq: Once | INTRAMUSCULAR | Status: AC
  Administered 2021-05-05: 15 mg via INTRAVENOUS
  Filled 2021-05-05: qty 1

## 2021-05-05 MED ORDER — FERROUS SULFATE 325 (65 FE) MG PO TABS
325.0000 mg | ORAL_TABLET | Freq: Every day | ORAL | Status: DC
  Administered 2021-05-06 – 2021-05-20 (×14): 325 mg via ORAL
  Filled 2021-05-05 (×14): qty 1

## 2021-05-05 MED ORDER — NICOTINE 14 MG/24HR TD PT24
14.0000 mg | MEDICATED_PATCH | Freq: Every day | TRANSDERMAL | Status: DC
  Administered 2021-05-06 – 2021-05-20 (×15): 14 mg via TRANSDERMAL
  Filled 2021-05-05 (×15): qty 1

## 2021-05-05 MED ORDER — HYDRALAZINE HCL 50 MG PO TABS
50.0000 mg | ORAL_TABLET | Freq: Three times a day (TID) | ORAL | Status: DC
  Administered 2021-05-05 – 2021-05-07 (×6): 50 mg via ORAL
  Filled 2021-05-05 (×6): qty 1

## 2021-05-05 NOTE — Plan of Care (Signed)

## 2021-05-05 NOTE — Progress Notes (Signed)
Pharmacy Antibiotic Note  Brenda [name] is a 61 y.o. female admitted on 04/15/2021 with  septic joint . Pharmacy consulted for vancomycin dosing.  Renal function remains stable. Stop dates have been added to her abx.    Antibiotics:  Vanc 01/04 >> (2/14) CTX  01/04 >> (2/14)   Levels /AUC and dose adjustments for Vanc: 04/23/21: VP 13, VT 4 AUC 223 -> on 750 q24h>  adjust dose to 750 mg q12h  1/17- VP 23, VT 13, AUC:482, cont 750 q12h   Microbiology: 1/04 Bcx: Negative  1/05 Hepatitis w/u: immunity to a b &c  Plan: Continue vancomycin 750 mg IV q12h Re-check Vancomycin trough level weekly (Tue) Scr and VT, next due 1/24 0530  x 3 weeks   Height: 5\' 4"  (162.6 cm) Weight: 42.5 kg (93 lb 11.1 oz) IBW/kg (Calculated) : 54.7  Temp (24hrs), Avg:98.2 F (36.8 C), Min:98 F (36.7 C), Max:98.5 F (36.9 C)  Recent Labs  Lab 04/28/21 2209 04/29/21 0500 05/02/21 0358 05/04/21 0332  WBC  --   --  7.8 8.2  CREATININE  --  0.60 0.73 0.74  VANCOTROUGH  --  13*  --   --   VANCOPEAK 23*  --   --   --      Estimated Creatinine Clearance: 50.8 mL/min (by C-G formula based on SCr of 0.74 mg/dL).    No Known Allergies   Onnie Boer, PharmD, BCIDP, AAHIVP, CPP Infectious Disease Pharmacist 05/05/2021 8:53 AM

## 2021-05-05 NOTE — Progress Notes (Signed)
NAME:  Brenda [name], MRN:  [id number], DOB:  14-Nov-1960, LOS: 53 ADMISSION DATE:  04/15/2021 Brenda [name] is [phone] with cervical spondylosis, opioid use disorder, hypertension, hyperlipidemia admitted 1/4 with septic facet joint L2-L3 on IV antibiotics for six weeks through 05/27/2021.  Subjective   Patient feels about the same today. Her back pain is bothering her the most and her abd pain is much improved. Denies any constipation.   Objective: Afebrile, normotensive, satting well on RA.   Blood pressure 124/61, pulse 80, temperature 98 F (36.7 C), temperature source Oral, resp. rate 17, height 5\' 4"  (1.626 m), weight 42.5 kg, last menstrual period 09/23/2007, SpO2 95 %.     Intake/Output Summary (Last 24 hours) at 05/05/2021 0550 Last data filed at 05/04/2021 1900 Gross per 24 hour  Intake 340 ml  Output --  Net 340 ml    Filed Weights   04/16/21 1726 04/16/21 2350  Weight: 47.2 kg 42.5 kg    Physical Exam   General: NAD Lungs:  Normal work of breathing, no respiratory distress Abdomen: No TTP in epigastric area MSK: normal bulk and tone.  Neuro: alert and oriented x4 Psych: normal mood and normal affect  Labs   No recent labs  Summary  Brenda [name] is 61yo with cervical spondylosis, opioid use disorder, hypertension, hyperlipidemia admitted 1/4 with septic facet joint L2-L3 on IV antibiotics for six weeks through 05/27/2021.  Assessment & Plan:  Principal Problem:   Septic joint ([place]) Active Problems:   Essential hypertension   Opioid use disorder, moderate, dependence (HCC)   Protein-calorie malnutrition, severe   Opiate withdrawal (HCC)  #Septic L2-L3 facet joint #Chronic back pain Seen on MRI. Patient on IV antibiotics, Ceftriaxone 2 g qd and Vancomycin 750 mg qd until 05/27/2021. NGTD on blood cultures. ID following. CBC today shows improved leukocytosis and no leukopenia or neutropenia. Plan to monitor her CBC on a weekly basis unless clinically indicated  earlier. Will plan to reach out to ID to see if patient can be transitioned to oral as she states 6 weeks is a long duration and she has to attend to her bills. Will also reach out to social work for support regarding her bills.  -Continue IV abx -Continue Suboxone 8-2 mg BID -Scheduled tylenol 1000 mg TID, Scheduled Celebrex 200 mg BID. -Schedule Robaxin 500 mg TID; titrate up if needed daily limit 4g/day for increased pain -Continue lyrica 50 mg TID, will consider increasing to 75 mg TID if pain continues to be an issue  -Will try to avoid other NSAIDs due to her gastritis.   #Opioid use disorder Patient on Suboxone since 04/29/21. Appears to be tolerating it well. Can increase Suboxone to TID with initially at 1/2 dose on third dosing if needed for pain.  -Will wean down as tolerated since pt taking Suboxone.  -Pain regimen including Suboxone as above -Oral ativan 1 mg BID prn  #Anxiety Started on hydroxyzine 50 mg TID yesterday and previously is on been on ativan 1 mg q6hrs. Ativan weaned down to 1 mg BID prn oral. Will hold off on SSRI given her prolonged Qtc.  -Continue Hydroxyzine 50 mg TID -Continue Ativan 1 mg BID prn oral and wean down further  #Normocytic anemia Hgb stable. Iron checked and lvl is 56 with 16 percent saturation, ferritin of 28. With leg cramps will have goal of ferritin of 70.  -Continue Protonix 40 mg BID -Avoid NSAIDS except celebrex 200 mg BID due to gastritis -CTM hgb -  Will restart iron 325 mg qd  #Hypertension BP was well controlled on current regimen. Blood pressures have been normotensive. With recent borderline hypotensive bps, we will start decreasing her regimen slowly.  -Continue Amlodipine 10 mg qd, Decrease Hydralazine to 50 mg TID, and Losartan 100 mg qd.   #Myalgias States patient both her leg burns after exertion. This is reproducible with activity. Her electrolyte are normal, iron low and vitamin D low which may be playing a role. Her cocaine  use may also be contributing as well. -Vitamin D 2000 IU daily.  -Will start iron 325 mg qd -CTM  Best practice:  DIET: Regular IVF: n/a DVT PPX: Xarelto 10 mg BOWEL: PRN CODE: DNR FAM COM: n/a  Idamae Schuller, MD [place]. [facility] Internal Medicine Residency, PGY-1   If after hours (below), please contact on-call pager: [phone] 5PM-7AM Monday-Friday 1PM-7AM Saturday-Sunday

## 2021-05-06 DIAGNOSIS — E43 Unspecified severe protein-calorie malnutrition: Secondary | ICD-10-CM | POA: Diagnosis not present

## 2021-05-06 DIAGNOSIS — L089 Local infection of the skin and subcutaneous tissue, unspecified: Secondary | ICD-10-CM

## 2021-05-06 DIAGNOSIS — M4650 Other infective spondylopathies, site unspecified: Secondary | ICD-10-CM | POA: Diagnosis not present

## 2021-05-06 DIAGNOSIS — F1120 Opioid dependence, uncomplicated: Secondary | ICD-10-CM | POA: Diagnosis not present

## 2021-05-06 DIAGNOSIS — I10 Essential (primary) hypertension: Secondary | ICD-10-CM | POA: Diagnosis not present

## 2021-05-06 DIAGNOSIS — M4626 Osteomyelitis of vertebra, lumbar region: Secondary | ICD-10-CM

## 2021-05-06 LAB — VANCOMYCIN, TROUGH: Vancomycin Tr: 17 ug/mL (ref 15–20)

## 2021-05-06 LAB — CREATININE, SERUM
Creatinine, Ser: 0.83 mg/dL (ref 0.44–1.00)
GFR, Estimated: >60 mL/min (ref >60)

## 2021-05-06 LAB — GLUCOSE, CAPILLARY: Glucose-Capillary: 106 mg/dL — ABNORMAL HIGH (ref 70–99)

## 2021-05-06 NOTE — Plan of Care (Signed)

## 2021-05-06 NOTE — Progress Notes (Signed)
Pharmacy Antibiotic Note  Brenda [name] is a 61 y.o. female admitted on 04/15/2021 with  septic joint . Pharmacy consulted for vancomycin dosing.  Renal function remains stable. Stop dates have been added to her abx. Vanc trough came back in range at 17 today.    Antibiotics:  Vanc 01/04 >> (2/14) CTX  01/04 >> (2/14)   Levels /AUC and dose adjustments for Vanc: 04/23/21: VP 13, VT 4 AUC 223 -> on 750 q24h>  adjust dose to 750 mg q12h  1/17- VP 23, VT 13, AUC:482, cont 750 q12h 1/24 vanc trough 17   Microbiology: 1/04 Bcx: Negative  1/05 Hepatitis w/u: immunity to a b &c  Plan: Continue vancomycin 750 mg IV q12h Re-check Vancomycin trough level weekly (Tue) Scr and VT, next due 1/31 0530  x 3 weeks   Height: 5\' 4"  (162.6 cm) Weight: 42.5 kg (93 lb 11.1 oz) IBW/kg (Calculated) : 54.7  Temp (24hrs), Avg:98 F (36.7 C), Min:97.8 F (36.6 C), Max:98.4 F (36.9 C)  Recent Labs  Lab 05/02/21 0358 05/04/21 0332 05/06/21 0556  WBC 7.8 8.2  --   CREATININE 0.73 0.74 0.83  VANCOTROUGH  --   --  17     Estimated Creatinine Clearance: 49 mL/min (by C-G formula based on SCr of 0.83 mg/dL).    No Known Allergies   Onnie Boer, PharmD, BCIDP, AAHIVP, CPP Infectious Disease Pharmacist 05/06/2021 9:03 AM

## 2021-05-06 NOTE — Progress Notes (Signed)
Nutrition Follow-up  DOCUMENTATION CODES:   Underweight  INTERVENTION:   Continue Multivitamin w/ minerals daily Continue Ensure Enlive to po TID, each supplement provides 350 kcal and 20 grams of protein Order snacks  Recommend obtaining new weight. RN notified.   NUTRITION DIAGNOSIS:   Increased nutrient needs related to acute illness as evidenced by estimated needs. - Ongoing  GOAL:   Patient will meet greater than or equal to 90% of their needs - Progressing   MONITOR:   PO intake, Supplement acceptance, Weight trends  REASON FOR ASSESSMENT:   Other (Comment) (Low BMI)    ASSESSMENT:   61 y.o female presented to the ED with back pain, nausea, vomiting, and diarrhea. PMH includes polysubstance abuse, HTN, lung cancer, malnutrition, and GERD. Pt admitted with L2-L3 septic joint and possible gastritis.  Pt reports that she is doing well, eating majority of her food. Pt reports that she has been getting a bit hungry after dinner time. RD to order snacks for pt. Pt reports that she has been drinking all her Ensure's and alternates between the Vanilla and Chocolate flavors.  Per EMR, pt intakes include: 1/21: Dinner 100% 1/22: Dinner 50% 1/24: Breakfast 60%  Pt reports that she is try to put on some weight. Continues with no new weight since admission.   Pt with no other questions or concerns at this time.   Medications reviewed and include: Vitamin D3, Ferrous Sulfate, Robaxin, MVI, Protonix, IV antibiotics  Labs reviewed.  Diet Order:   Diet Order             Diet regular Room service appropriate? Yes; Fluid consistency: Thin  Diet effective now                   EDUCATION NEEDS:   No education needs have been identified at this time  Skin:  Skin Assessment: Reviewed RN Assessment  Last BM:  01/23  Height:   Ht Readings from Last 1 Encounters:  04/16/21 5\' 4"  (1.626 m)    Weight:   Wt Readings from Last 1 Encounters:  04/16/21 42.5 kg     Ideal Body Weight:  54.6 kg  BMI:  Body mass index is 16.08 kg/m.  Estimated Nutritional Needs:   Kcal:  1500-1700  Protein:  75-90 grams  Fluid:  >/= 1.5 L    Rose Hippler Louie Casa, RD, LDN Clinical Dietitian See [facility] for contact information.

## 2021-05-06 NOTE — Progress Notes (Signed)
[facility] for Infectious Disease    Date of Admission:  04/15/2021   Total days of antibiotics 21/vanco and ceftriaxone           ID: Brenda [name] is a 61 y.o. 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 Principal Problem:   Septic joint ([place]) Active Problems:   Essential hypertension   Opioid use disorder, moderate, dependence (HCC)   Protein-calorie malnutrition, severe   Opiate withdrawal (HCC)    Subjective: Having ongoing back pain " I wish that was treated better instead of suboxone"  Medications:   acetaminophen  1,000 mg Oral TID   amLODipine  10 mg Oral Daily   buprenorphine-naloxone  1 tablet Sublingual BID   celecoxib  200 mg Oral BID   Chlorhexidine Gluconate Cloth  6 each Topical Daily   cholecalciferol  2,000 Units Oral Daily   feeding supplement  237 mL Oral TID BM   ferrous sulfate  325 mg Oral Q breakfast   hydrALAZINE  50 mg Oral Q8H   hydrOXYzine  50 mg Oral TID   losartan  100 mg Oral Daily   melatonin  3 mg Oral QHS   methocarbamol  500 mg Oral TID   multivitamin with minerals  1 tablet Oral Daily   nicotine  14 mg Transdermal Daily   pantoprazole  40 mg Oral BID   pregabalin  50 mg Oral TID   rivaroxaban  10 mg Oral Daily   zolpidem  5 mg Oral QHS    Objective: Vital signs in last 24 hours: Temp:  [97.8 F (36.6 C)-98.5 F (36.9 C)] 98.5 F (36.9 C) (01/24 1606) Pulse Rate:  [70-82] 80 (01/24 1606) Resp:  [16-19] 18 (01/24 1606) BP: (108-129)/(55-64) 129/57 (01/24 1606) SpO2:  [97 %-98 %] 97 % (01/24 1144)  Physical Exam  Constitutional:  oriented to person, place, and time. appears well-developed and well-nourished. No distress.  HENT: [place]/AT, PERRLA, no scleral icterus Mouth/Throat: Oropharynx is clear and moist. No oropharyngeal exudate.  Cardiovascular: Normal rate, regular rhythm and normal heart sounds. Exam reveals no gallop and no friction rub.  No murmur heard.  Pulmonary/Chest: Effort  normal and breath sounds normal. No respiratory distress.  has no wheezes.  Neck = supple, no nuchal rigidity Abdominal: Soft. Bowel sounds are normal.  exhibits no distension. There is no tenderness.  Lymphadenopathy: no cervical adenopathy. No axillary adenopathy Neurological: alert and oriented to person, place, and time.  Skin: Skin is warm and dry. scattered pustules Psychiatric: a normal mood and affect.  behavior is normal.    Lab Results Recent Labs    05/04/21 0332 05/06/21 0556  WBC 8.2  --   HGB 10.5*  --   HCT 32.4*  --   NA 138  --   K 4.3  --   CL 106  --   CO2 25  --   BUN 35*  --   CREATININE 0.74 0.83   Lab Results  Component Value Date   ESRSEDRATE 46 (H) 04/16/2021   Lab Results  Component Value Date   CRP 6.5 (H) 04/16/2021     Microbiology: Blood cx 1 of 4 bottles bacillus Studies/Results: No results found.   Assessment/Plan: 61yo F with opiate use disorder, hx of lumbar osteo, comes in with back pain found to have new lesion at L2-L3 concern for discitis/septic arthritis. Currently on day 21 of vancomycin and ceftriaxone. Plan for addn week then convert to  oral abtx. Should she decide not to stay futher, can consider alternative regimen of giving dalbavancin plus oral abtx.  Opiate use disorder = currently on suboxone but appears to have addn back pain.  Mrsa skin pustules = consider mupirocin BID on lesions.  [facility] for Infectious Diseases Pager: [phone]  05/06/2021, 4:57 PM

## 2021-05-06 NOTE — Progress Notes (Signed)
NAME:  KIMBERLE STANFILL, MRN:  [id number], DOB:  [birth year], LOS: 13 ADMISSION DATE:  04/15/2021 [name] is [phone] with cervical spondylosis, opioid use disorder, hypertension, hyperlipidemia admitted 1/4 with septic facet joint L2-L3 on IV antibiotics for six weeks through 05/27/2021.  Subjective  Stated back pain was bothering her. She was trying to leave yesterday as she has bills to pay and things to take care of. Denied any acute concerns.   Objective: Afebrile, normotensive, satting well on RA.   Blood pressure (!) 108/55, pulse 82, temperature 97.9 F (36.6 C), temperature source Oral, resp. rate 18, height 5\' 4"  (1.626 m), weight 42.5 kg, last menstrual period 09/23/2007, SpO2 98 %.    No intake or output data in the 24 hours ending 05/06/21 0639  Filed Weights   04/16/21 1726 04/16/21 2350  Weight: 47.2 kg 42.5 kg    Physical Exam   General: NAD Lungs:  Normal work of breathing, no respiratory distress Abdomen: No TTP in epigastric area MSK: normal bulk and tone.  Neuro: alert and oriented x4 Psych: normal mood and normal affect  Labs   No recent labs  Summary  Quintana Canelo is 61yo with cervical spondylosis, opioid use disorder, hypertension, hyperlipidemia admitted 1/4 with septic facet joint L2-L3 on IV antibiotics for six weeks through 05/27/2021.  Assessment & Plan:  Principal Problem:   Septic joint ([place]) Active Problems:   Essential hypertension   Opioid use disorder, moderate, dependence (HCC)   Protein-calorie malnutrition, severe   Opiate withdrawal (HCC)  #Septic L2-L3 facet joint #Chronic back pain Seen on MRI. Patient on IV antibiotics, Ceftriaxone 2 g qd and Vancomycin 750 mg qd until 05/27/2021. NGTD on blood cultures. ID following. CBC today shows improved leukocytosis and no leukopenia or neutropenia. Plan to monitor her CBC on a weekly basis unless clinically indicated earlier. Will plan to reach out to ID to see if patient can be transitioned to  oral as she states 6 weeks is a long duration and she states she has to attend to her bills and had multiple times where she threatened to leaved AMA.  -Continue Suboxone 8-2 mg BID -Scheduled tylenol 1000 mg TID, Scheduled Celebrex 200 mg BID. -Schedule Robaxin 500 mg TID; titrate up if needed daily limit 4g/day for increased pain -Will increase lyrica 75 mg TID -Will try to avoid other NSAIDs due to her gastritis.   #Opioid use disorder Patient on Suboxone since 04/29/21. Appears to be tolerating it well. Can increase Suboxone to TID with initially at 1/2 dose on third dosing if needed for pain.  -Pain regimen including Suboxone as above -Oral ativan 1 mg BID prn -Will wean down Ativan as tolerated since pt taking Suboxone.   #Anxiety Started on hydroxyzine 50 mg TID yesterday and previously is on been on ativan 1 mg q6hrs. Ativan weaned down to 1 mg BID prn oral. Will hold off on SSRI given her prolonged Qtc.  -Continue Hydroxyzine 50 mg TID -Continue Ativan 1 mg BID prn oral and wean down further  #Normocytic anemia Hgb stable. Iron checked and lvl is 56 with 16 percent saturation, ferritin of 28. With leg cramps will have goal of ferritin of 70.  -Continue Protonix 40 mg BID -Avoid NSAIDS except celebrex 200 mg BID due to gastritis -CTM hgb -Continue iron 325 mg qd  #Hypertension BP was well controlled on current regimen. Blood pressures have been normotensive.  -Continue Amlodipine 10 mg qd, Decrease Hydralazine to 50 mg TID, and  Losartan 100 mg qd.   #Myalgias Stable. States patient both her leg burns after exertion. This is reproducible with activity. Her electrolyte are normal, iron low and vitamin D low which may be playing a role. Her cocaine use may also be contributing as well. -Vitamin D 2000 IU daily.  -Continue iron 325 mg qd -CTM  Best practice:  DIET: Regular IVF: n/a DVT PPX: Xarelto 10 mg BOWEL: PRN CODE: DNR FAM COM: n/a  Idamae Schuller, MD [place]. [facility] Internal Medicine Residency, PGY-1   If after hours (below), please contact on-call pager: [phone] 5PM-7AM Monday-Friday 1PM-7AM Saturday-Sunday

## 2021-05-07 LAB — GLUCOSE, CAPILLARY
Glucose-Capillary: 273 mg/dL — ABNORMAL HIGH (ref 70–99)
Glucose-Capillary: 111 mg/dL — ABNORMAL HIGH (ref 70–99)

## 2021-05-07 MED ORDER — MUPIROCIN CALCIUM 2 % EX CREA
TOPICAL_CREAM | Freq: Two times a day (BID) | CUTANEOUS | Status: DC
  Administered 2021-05-12: 1 via TOPICAL
  Filled 2021-05-07 (×2): qty 15

## 2021-05-07 MED ORDER — PREGABALIN 75 MG PO CAPS
75.0000 mg | ORAL_CAPSULE | Freq: Three times a day (TID) | ORAL | Status: DC
  Administered 2021-05-07 – 2021-05-09 (×8): 75 mg via ORAL
  Filled 2021-05-07 (×9): qty 1

## 2021-05-07 MED ORDER — HYDRALAZINE HCL 25 MG PO TABS
25.0000 mg | ORAL_TABLET | Freq: Three times a day (TID) | ORAL | Status: DC
  Administered 2021-05-07 – 2021-05-08 (×3): 25 mg via ORAL
  Filled 2021-05-07 (×3): qty 1

## 2021-05-07 NOTE — Plan of Care (Signed)

## 2021-05-07 NOTE — Progress Notes (Signed)
NAME:  SERAPHIM AFFINITO, MRN:  [id number], DOB:  January 02, 1961, LOS: 33 ADMISSION DATE:  04/15/2021 [name] is [phone] with cervical spondylosis, opioid use disorder, hypertension, hyperlipidemia admitted 1/4 with septic facet joint L2-L3 on IV antibiotics for six weeks through 05/27/2021.  Subjective  Brenda [name] Patient laying in bed in no acute distress at evaluation. She states she is having pain in her spine, which prevents her from sleeping. She states the lyrica is helping. She stated her legs where burning when she walked around the hallway.   Objective: Afebrile, normotensive, satting well on RA.   Blood pressure (!) 112/55, pulse 71, temperature 98.1 F (36.7 C), temperature source Oral, resp. rate 17, height 5\' 4"  (1.626 m), weight 42.5 kg, last menstrual period 09/23/2007, SpO2 98 %.     Intake/Output Summary (Last 24 hours) at 05/07/2021 8366 Last data filed at 05/06/2021 2947 Gross per 24 hour  Intake 240 ml  Output --  Net 240 ml   Filed Weights   04/16/21 1726 04/16/21 2350  Weight: 47.2 kg 42.5 kg   Physical Exam   General: NAD Lungs:  Normal work of breathing, no respiratory distress Abdomen: No TTP in epigastric area MSK: normal bulk and tone.  Neuro: alert and oriented x4 Skin: lesions on bilateral lower extremities with ulceration (see media tab) Psych: normal mood and normal affect  Labs   No recent labs  Summary  Brenda [name] is 61yo with cervical spondylosis, opioid use disorder, hypertension, hyperlipidemia admitted 1/4 with septic facet joint L2-L3 on IV antibiotics for six weeks through 05/27/2021.  Assessment & Plan:  Principal Problem:   Septic joint ([place]) Active Problems:   Essential hypertension   Opioid use disorder, moderate, dependence (HCC)   Protein-calorie malnutrition, severe   Opiate withdrawal ([place])   Pustules determined by examination  #Septic L2-L3 facet joint #Chronic back pain Seen on MRI. Patient on IV antibiotics, Ceftriaxone 2 g qd  and Vancomycin 750 mg qd until 05/27/2021. NGTD on blood cultures. ID following. CBC today shows improved leukocytosis and no leukopenia or neutropenia. Plan to monitor her CBC on a weekly basis unless clinically indicated earlier. Since patient eager to leave the hospital and has threatened to leave AMA. ID recommends converting to oral abx next week.  -Continue Suboxone 8-2 mg BID -Scheduled tylenol 1000 mg TID, Scheduled Celebrex 200 mg BID. -Schedule Robaxin 500 mg TID; titrate up if needed daily limit 4g/day for increased pain -Continue lyrica 75 mg TID -Will try to avoid other NSAIDs due to her gastritis.  Skin Pustules Patient states these have occurred during this hospitalization. Initially appeared raised lesions similar to sebaceous cyst but have formed ulceration.  -Will try mupirocin 2%  BID  #Opioid use disorder Patient on Suboxone since 04/29/21. Appears to be tolerating it well. Can increase Suboxone to TID with initially at 1/2 dose on third dosing if needed for pain.  -Pain regimen including Suboxone as above   #Anxiety Started on hydroxyzine 50 mg TID yesterday and previously is on been on ativan 1 mg q6hrs. Ativan weaned down to 1 mg BID prn oral. Will hold off on SSRI given her prolonged Qtc.  -Continue Hydroxyzine 50 mg TID  #Normocytic anemia Hgb stable. Iron checked and lvl is 56 with 16 percent saturation, ferritin of 28. With leg cramps will have goal of ferritin of 70.  -Continue Protonix 40 mg BID -Avoid NSAIDS except celebrex 200 mg BID due to gastritis -CTM hgb -Continue iron 325 mg qd  #  Hypertension BP was well controlled on current regimen. Blood pressures have been normotensive.  -Continue Amlodipine 10 mg qd, Decrease Hydralazine to 25 mg TID, and Losartan 100 mg qd.   #Myalgias Stable. States patient both her leg burns after exertion. This is reproducible with activity. Her electrolyte are normal, iron low and vitamin D low which may be playing a role.  Her cocaine use may also be contributing as well. -Vitamin D 2000 IU daily.  -Continue iron 325 mg qd -CTM  Best practice:  DIET: Regular IVF: n/a DVT PPX: Xarelto 10 mg BOWEL: PRN CODE: DNR FAM COM: n/a  Idamae Schuller, MD [place]. [facility] Internal Medicine Residency, PGY-1   If after hours (below), please contact on-call pager: [phone] 5PM-7AM Monday-Friday 1PM-7AM Saturday-Sunday

## 2021-05-08 DIAGNOSIS — L089 Local infection of the skin and subcutaneous tissue, unspecified: Secondary | ICD-10-CM | POA: Diagnosis not present

## 2021-05-08 DIAGNOSIS — E43 Unspecified severe protein-calorie malnutrition: Secondary | ICD-10-CM | POA: Diagnosis not present

## 2021-05-08 DIAGNOSIS — I10 Essential (primary) hypertension: Secondary | ICD-10-CM | POA: Diagnosis not present

## 2021-05-08 DIAGNOSIS — F1120 Opioid dependence, uncomplicated: Secondary | ICD-10-CM | POA: Diagnosis not present

## 2021-05-08 NOTE — Progress Notes (Signed)
NAME:  Brenda [name], MRN:  [id number], DOB:  Oct 09, 1960, LOS: 7 ADMISSION DATE:  04/15/2021 Brenda [name] is [phone] with cervical spondylosis, opioid use disorder, hypertension, hyperlipidemia admitted 1/4 with septic facet joint L2-L3 on IV antibiotics for six weeks through 05/27/2021.  Subjective  NAEON Patient states she is doing better today. Patient states she slept well last night. Had 2 episodes of diarrhea that is new for the patient.    Objective: Afebrile, normotensive, satting well on RA.   Blood pressure 133/60, pulse 75, temperature (!) 97.4 F (36.3 C), temperature source Oral, resp. rate 17, height 5\' 4"  (1.626 m), weight 42.5 kg, last menstrual period 09/23/2007, SpO2 97 %.    No intake or output data in the 24 hours ending 05/08/21 0630  Filed Weights   04/16/21 1726 04/16/21 2350  Weight: 47.2 kg 42.5 kg   Physical Exam     General: NAD Lungs:  Normal work of breathing, no respiratory distress Abdomen: No TTP in epigastric area MSK: normal bulk and tone.  Neuro: alert and oriented x4 Skin: lesions on bilateral lower extremities with ulceration (see media tab) Psych: normal mood and normal affect  Labs   No recent labs  Summary  Brenda [name] is 61yo with cervical spondylosis, opioid use disorder, hypertension, hyperlipidemia admitted 1/4 with septic facet joint L2-L3 on IV antibiotics for six weeks through 05/27/2021.  Assessment & Plan:  Principal Problem:   Septic joint ([place]) Active Problems:   Essential hypertension   Opioid use disorder, moderate, dependence (HCC)   Protein-calorie malnutrition, severe   Opiate withdrawal ([place])   Pustules determined by examination  #Septic L2-L3 facet joint #Chronic back pain Seen on MRI. Patient on IV antibiotics, Ceftriaxone 2 g qd and Vancomycin 750 mg qd until 05/27/2021. NGTD on blood cultures. ID following. Plan to monitor her CBC on a weekly basis unless clinically indicated earlier. Since patient eager to leave  the hospital and has threatened to leave AMA. ID recommends converting to oral abx next week. Given her diarrhea, repeating CBC appears appropriate to rule out leukopenia.  -Continue Suboxone 8-2 mg BID -Scheduled tylenol 1000 mg TID, Scheduled Celebrex 200 mg BID. -Schedule Robaxin 500 mg TID; titrate up if needed daily limit 4g/day for increased pain -Continue lyrica 75 mg TID -Will try to avoid other NSAIDs due to her gastritis.  #MRSA Skin Pustules Patient states these have occurred during this hospitalization. Initially appeared raised lesions similar to sebaceous cyst but have formed ulceration.  -Continue mupirocin 2%  BID  #Opioid use disorder Patient on Suboxone since 04/29/21. Appears to be tolerating it well. States pain is doing well today. Can increase Suboxone to TID with initially at 1/2 dose on third dosing if needed for pain.  -Pain regimen including Suboxone as above   #Anxiety Appears multifactorial. Improving. Started on hydroxyzine 50 mg TID. Will hold off on SSRI given her prolonged Qtc. Ativan was discontinued.  -Continue Hydroxyzine 50 mg TID  #Normocytic anemia Hgb stable. Iron checked and lvl is 56 with 16 percent saturation, ferritin of 28. With leg cramps will have goal of ferritin of 70.  -Continue Protonix 40 mg BID -Avoid NSAIDS except celebrex 200 mg BID due to gastritis -Continue iron 325 mg qd -CTM hgb  #Hypertension BP was well controlled on current regimen. Blood pressures have been normotensive.  -Continue Amlodipine 10 mg qd, Losartan 100 mg qd and will discontinue hydralazine today given normotensive bps.   #Myalgias Stable. States patient both her  leg burns after exertion. This is reproducible with activity. Her electrolyte are normal, iron low and vitamin D low which may be playing a role. Her cocaine use may also be contributing as well. -Vitamin D 2000 IU daily.  -Continue iron 325 mg qd -CTM  Best practice:  DIET: Regular IVF: n/a DVT  PPX: Xarelto 10 mg BOWEL: PRN CODE: DNR FAM COM: n/a  Brenda Schuller, MD [place]. [facility] Internal Medicine Residency, PGY-1   If after hours (below), please contact on-call pager: [phone] 5PM-7AM Monday-Friday 1PM-7AM Saturday-Sunday

## 2021-05-09 DIAGNOSIS — F1120 Opioid dependence, uncomplicated: Secondary | ICD-10-CM | POA: Diagnosis not present

## 2021-05-09 DIAGNOSIS — M4650 Other infective spondylopathies, site unspecified: Secondary | ICD-10-CM | POA: Diagnosis not present

## 2021-05-09 LAB — BASIC METABOLIC PANEL
Anion gap: 7 (ref 5–15)
BUN: 36 mg/dL — ABNORMAL HIGH (ref 6–20)
CO2: 25 mmol/L (ref 22–32)
Calcium: 9.4 mg/dL (ref 8.9–10.3)
Chloride: 103 mmol/L (ref 98–111)
Creatinine, Ser: 0.81 mg/dL (ref 0.44–1.00)
GFR, Estimated: >60 mL/min (ref >60)
Glucose, Bld: 117 mg/dL — ABNORMAL HIGH (ref 70–99)
Potassium: 5.3 mmol/L — ABNORMAL HIGH (ref 3.5–5.1)
Sodium: 135 mmol/L (ref 135–145)

## 2021-05-09 LAB — CBC WITH DIFFERENTIAL/PLATELET
Abs Immature Granulocytes: 0 10*3/uL (ref 0.00–0.07)
Basophils Absolute: 0 10*3/uL (ref 0.0–0.1)
Basophils Relative: 0 %
Eosinophils Absolute: 0.9 10*3/uL — ABNORMAL HIGH (ref 0.0–0.5)
Eosinophils Relative: 10 %
HCT: 31 % — ABNORMAL LOW (ref 36.0–46.0)
Hemoglobin: 10 g/dL — ABNORMAL LOW (ref 12.0–15.0)
Lymphocytes Relative: 33 %
Lymphs Abs: 2.8 10*3/uL (ref 0.7–4.0)
MCH: 29.2 pg (ref 26.0–34.0)
MCHC: 32.3 g/dL (ref 30.0–36.0)
MCV: 90.4 fL (ref 80.0–100.0)
Monocytes Absolute: 0.5 10*3/uL (ref 0.1–1.0)
Monocytes Relative: 6 %
Neutro Abs: 4.4 10*3/uL (ref 1.7–7.7)
Neutrophils Relative %: 51 %
Platelets: 207 10*3/uL (ref 150–400)
RBC: 3.43 MIL/uL — ABNORMAL LOW (ref 3.87–5.11)
RDW: 15.3 % (ref 11.5–15.5)
WBC: 8.6 10*3/uL (ref 4.0–10.5)
nRBC: 0 % (ref 0.0–0.2)
nRBC: 0 /100 WBC

## 2021-05-09 MED ORDER — SODIUM ZIRCONIUM CYCLOSILICATE 5 G PO PACK
5.0000 g | PACK | Freq: Once | ORAL | Status: AC
  Administered 2021-05-09: 5 g via ORAL
  Filled 2021-05-09: qty 1

## 2021-05-09 NOTE — Progress Notes (Addendum)
NAME:  ZARRIAH STARKEL, MRN:  [id number], DOB:  [birth year], LOS: 17 ADMISSION DATE:  04/15/2021 [name] is [phone] with cervical spondylosis, opioid use disorder, hypertension, hyperlipidemia admitted with septic facet joint L2-L3 on IV antibiotics for six weeks through 05/27/2021.  Subjective: No acute events overnight.  Patient reports stable lower extremity burning pain.  Has had persistence of pustules on legs and reports cleaning and covering pustules appropriately.  Reports ambulating around hospital unit.  Is looking forward to discharge next week.  Appropriate p.o. intake and regular BMs.     Objective: Afebrile, normotensive, satting well on RA.   Blood pressure (!) 106/53, pulse 72, temperature 98 F (36.7 C), temperature source Oral, resp. rate 18, height 5\' 4"  (1.626 m), weight 42.5 kg, last menstrual period 09/23/2007, SpO2 96 %.     Intake/Output Summary (Last 24 hours) at 05/09/2021 0739 Last data filed at 05/08/2021 2032 Gross per 24 hour  Intake 240 ml  Output 1 ml  Net 239 ml   Filed Weights   04/16/21 1726 04/16/21 2350  Weight: 47.2 kg 42.5 kg   Physical Exam     General: NAD Lungs: Normal work of breathing, no respiratory distress Abdomen: No TTP in epigastric area MSK: normal bulk and tone.  Neuro: alert and oriented x4 Skin: lesions on bilateral lower extremities with ulceration (see media tab) Psych: normal mood and normal affect  Labs    CBC Latest Ref Rng & Units 05/09/2021 05/04/2021 05/02/2021  WBC 4.0 - 10.5 K/uL 8.6 8.2 7.8  Hemoglobin 12.0 - 15.0 g/dL 10.0(L) 10.5(L) 10.2(L)  Hematocrit 36.0 - 46.0 % 31.0(L) 32.4(L) 32.2(L)  Platelets 150 - 400 K/uL 207 241 259   BMP Latest Ref Rng & Units 05/09/2021 05/06/2021 05/04/2021  Glucose 70 - 99 mg/dL 117(H) - 107(H)  BUN 6 - 20 mg/dL 36(H) - 35(H)  Creatinine 0.44 - 1.00 mg/dL 0.81 0.83 0.74  BUN/Creat Ratio 9 - 23 - - -  Sodium 135 - 145 mmol/L 135 - 138  Potassium 3.5 - 5.1 mmol/L 5.3(H) - 4.3   Chloride 98 - 111 mmol/L 103 - 106  CO2 22 - 32 mmol/L 25 - 25  Calcium 8.9 - 10.3 mg/dL 9.4 - 9.1    Summary  Reaghan Kawa is 61yo with cervical spondylosis, opioid use disorder, hypertension, hyperlipidemia admitted with septic facet joint L2-L3 on IV antibiotics for six weeks through 05/27/2021.  Assessment & Plan:  Principal Problem:   Septic joint ([place]) Active Problems:   Essential hypertension   Opioid use disorder, moderate, dependence (HCC)   Protein-calorie malnutrition, severe   Opiate withdrawal ([place])   Pustules determined by examination  #L2-L3 Discitis #Chronic back pain Diagnosis supported by MRI imaging findings. ID following. Patient on empiric IV antibiotics, Ceftriaxone 2 g qd and Vancomycin 750 mg qd until 05/27/2021. NGTD on blood cultures. Plan to treat with dalbavancin plus oral abtx (likely doxy) if leaves AMA. Plan: -Continue IV Ceftriaxone and Vancomycin, plan to switch to oral abx next week -Continue Suboxone 8-2 mg BID -Scheduled tylenol 1000 mg TID, Scheduled Celebrex 200 mg BID. -Schedule Robaxin 500 mg TID; titrate up if needed daily limit 4g/day for increased pain -Continue lyrica 75 mg TID -Will try to avoid other NSAIDs due to her gastritis.  #MRSA Skin Pustules Patient states these have occurred during this hospitalization.  Started as raised violaceous lesions that subsequently ulcerated with drainage. ID suspect these are skin pustules 2/2 MRSA infection. Plan: -Continue mupirocin 2%  BID  #Opioid use disorder Patient on Suboxone since 04/29/21. Appears to be tolerating it well. States pain is doing well today. Can increase Suboxone to TID starting at 1/2 dose on third dosing if needed for pain.  Plan: -Pain regimen including Suboxone as above  #Underweight BMI 17.84 on admission. RD following. Patient reports good po intake, drinking ensure shakes, reports weight gain since admission. Plan: -Nutrition following, appreciate  recommendations  -Multivitamin, Ensure Enlive 3 times daily, snacks -Measure patient weight today  #Anxiety Appears multifactorial. Improving. Started on hydroxyzine 50 mg TID. Will hold off on SSRI given her prolonged Qtc. Ativan was discontinued.  Plan: -Continue Hydroxyzine 50 mg TID  #Normocytic anemia Hgb stable. Iron checked and lvl is 56 with 16 percent saturation, ferritin of 28. With leg cramps will have goal of ferritin of 70.  Plan: -Continue Protonix 40 mg BID -Avoid NSAIDS except celebrex 200 mg BID due to gastritis -Continue iron 325 mg qd -CTM hgb  #Hypertension BP was well controlled on current regimen. Blood pressures have been normotensive.  Plan: -Continue Amlodipine 10 mg qd -Continue losartan 100 mg qd      DIET: Regular IVF: n/a DVT PPX: Xarelto 10 mg BOWEL: PRN CODE: DNR FAM COM: Starleen Blue, MD 05/09/21,  7:40 AM Pager: [phone] Internal Medicine Resident, PGY-1 [facility]

## 2021-05-09 NOTE — Progress Notes (Signed)
Ok to give Lokelma 5g PO x1 for k+ 5.3 per Dr Collene Gobble.  Onnie Boer, PharmD, BCIDP, AAHIVP, CPP Infectious Disease Pharmacist 05/09/2021 3:01 PM

## 2021-05-10 DIAGNOSIS — M4650 Other infective spondylopathies, site unspecified: Secondary | ICD-10-CM | POA: Diagnosis not present

## 2021-05-10 LAB — GLUCOSE, CAPILLARY
Glucose-Capillary: 117 mg/dL — ABNORMAL HIGH (ref 70–99)
Glucose-Capillary: 127 mg/dL — ABNORMAL HIGH (ref 70–99)
Glucose-Capillary: 85 mg/dL (ref 70–99)
Glucose-Capillary: 107 mg/dL — ABNORMAL HIGH (ref 70–99)

## 2021-05-10 LAB — BASIC METABOLIC PANEL
Anion gap: 8 (ref 5–15)
BUN: 34 mg/dL — ABNORMAL HIGH (ref 6–20)
CO2: 25 mmol/L (ref 22–32)
Calcium: 9.5 mg/dL (ref 8.9–10.3)
Chloride: 104 mmol/L (ref 98–111)
Creatinine, Ser: 0.77 mg/dL (ref 0.44–1.00)
GFR, Estimated: >60 mL/min (ref >60)
Glucose, Bld: 112 mg/dL — ABNORMAL HIGH (ref 70–99)
Potassium: 4.9 mmol/L (ref 3.5–5.1)
Sodium: 137 mmol/L (ref 135–145)

## 2021-05-10 LAB — CBC
HCT: 31.7 % — ABNORMAL LOW (ref 36.0–46.0)
Hemoglobin: 10.4 g/dL — ABNORMAL LOW (ref 12.0–15.0)
MCH: 30.1 pg (ref 26.0–34.0)
MCHC: 32.8 g/dL (ref 30.0–36.0)
MCV: 91.9 fL (ref 80.0–100.0)
Platelets: 215 10*3/uL (ref 150–400)
RBC: 3.45 MIL/uL — ABNORMAL LOW (ref 3.87–5.11)
RDW: 15.4 % (ref 11.5–15.5)
WBC: 9.3 10*3/uL (ref 4.0–10.5)
nRBC: 0 % (ref 0.0–0.2)

## 2021-05-10 MED ORDER — PREGABALIN 25 MG PO CAPS
50.0000 mg | ORAL_CAPSULE | Freq: Three times a day (TID) | ORAL | Status: DC
  Administered 2021-05-10 – 2021-05-20 (×30): 50 mg via ORAL
  Filled 2021-05-10 (×30): qty 2

## 2021-05-10 NOTE — Progress Notes (Signed)
HD#24 Subjective:  Overnight Events: NAEO   Patient describes several episodes over the last three days where she spaces out and feels like she loses the sense of time passing. She says that she is not falling asleep during these periods but just does not feel right or like herself. She thinks it might be related to the [place].   Objective:  Vital signs in last 24 hours: Vitals:   05/09/21 2012 05/10/21 0030 05/10/21 0447 05/10/21 0759  BP: (!) 110/59 (!) 102/53 132/64 122/61  Pulse: 72 65 66 70  Resp: 20 18 18 19   Temp: 98 F (36.7 C) 98 F (36.7 C) 98 F (36.7 C) 98.3 F (36.8 C)  TempSrc: Oral Oral Oral Oral  SpO2: 97%   95%  Weight:      Height:       Supplemental O2: Room Air SpO2: 95 %   Physical Exam:  Constitutional: Chronically ill appearing female resting comfortably in bed, in no acute distress HENT: normocephalic atraumatic, mucous membranes moist Eyes: conjunctiva non-erythematous Neck: supple Cardiovascular: regular rate and rhythm, no m/r/g Pulmonary/Chest: normal work of breathing on room air Abdominal: soft, non-tender, non-distended MSK: normal bulk and tone Neurological: alert & oriented x 3 Skin: warm and dry Psych: normal affect  Filed Weights   04/16/21 1726 04/16/21 2350  Weight: 47.2 kg 42.5 kg     Intake/Output Summary (Last 24 hours) at 05/10/2021 0846 Last data filed at 05/09/2021 1559 Gross per 24 hour  Intake 480 ml  Output --  Net 480 ml   Net IO Since Admission: 9,623.14 mL [05/10/21 0846]  Pertinent Labs: CBC Latest Ref Rng & Units 05/10/2021 05/09/2021 05/04/2021  WBC 4.0 - 10.5 K/uL 9.3 8.6 8.2  Hemoglobin 12.0 - 15.0 g/dL 10.4(L) 10.0(L) 10.5(L)  Hematocrit 36.0 - 46.0 % 31.7(L) 31.0(L) 32.4(L)  Platelets 150 - 400 K/uL 215 207 241    CMP Latest Ref Rng & Units 05/10/2021 05/09/2021 05/06/2021  Glucose 70 - 99 mg/dL 112(H) 117(H) -  BUN 6 - 20 mg/dL 34(H) 36(H) -  Creatinine 0.44 - 1.00 mg/dL 0.77 0.81 0.83  Sodium  135 - 145 mmol/L 137 135 -  Potassium 3.5 - 5.1 mmol/L 4.9 5.3(H) -  Chloride 98 - 111 mmol/L 104 103 -  CO2 22 - 32 mmol/L 25 25 -  Calcium 8.9 - 10.3 mg/dL 9.5 9.4 -  Total Protein 6.5 - 8.1 g/dL - - -  Total Bilirubin 0.3 - 1.2 mg/dL - - -  Alkaline Phos 38 - 126 U/L - - -  AST 15 - 41 U/L - - -  ALT 0 - 44 U/L - - -    Imaging: No results found.  Assessment/Plan:   Principal Problem:   Septic joint ([place]) Active Problems:   Essential hypertension   Opioid use disorder, moderate, dependence (HCC)   Protein-calorie malnutrition, severe   Opiate withdrawal ([place])   Pustules determined by examination   Patient Summary: Brenda [name] is a 61 y.o. with a pertinent PMH of cervical spondylosis, opioid use disorder, hypertension, hyperlipidemia admitted with septic facet joint L2-L3 on IV antibiotics.   #L2-L3 Discitis #Chronic back pain Diagnosis supported by MRI imaging findings. ID following. Patient on empiric IV antibiotics, Ceftriaxone 2 g qd and Vancomycin 750 mg qd until 05/27/2021, will deescalate to oral therapy next week. -Continue IV Ceftriaxone and Vancomycin -dalbavancin plus oral abtx (likely doxy) if leaves AMA. -Continue Suboxone 8-2 mg BID -Scheduled tylenol 1000 mg  TID, Scheduled Celebrex 200 mg BID. -Schedule Robaxin 500 mg TID; titrate up if needed daily limit 4g/day for increased pain -Continue lyrica 75 mg TID -Will try to avoid other NSAIDs due to her gastritis.  #Brain fog Patient mentions feeling spaced out and has the feeling that some time has passed without her realizing it. Does not fall asleep or lose consciousness during these episodes and they do not happen when anyone is around to witness them. She is not sure if it might be related to [place]. Episodes started 3-4 days ago which coincided with increased lyrica dose - Will decrease lyrica dose back to 50 TID - CTM   #MRSA Skin Pustules Patient states these have occurred during this  hospitalization.  Started as raised violaceous lesions that subsequently ulcerated with drainage. ID suspect these are skin pustules 2/2 MRSA infection. Plan: -Continue mupirocin 2%  BID   #Opioid use disorder Patient on Suboxone since 04/29/21. Appears to be tolerating it well. States pain is doing well today. Can increase Suboxone to TID starting at 1/2 dose on third dosing if needed for pain.  Plan: -Pain regimen including Suboxone as above   #Underweight BMI 17.84 on admission. RD following. Patient reports good po intake, drinking ensure shakes, reports weight gain since admission. Plan: -Nutrition following, appreciate recommendations  -Multivitamin, Ensure Enlive 3 times daily, snacks -Measure patient weight today   #Anxiety Appears multifactorial. Started on hydroxyzine 50 mg TID. Will hold off on SSRI given her prolonged Qtc. Ativan was discontinued.   Plan: -Continue Hydroxyzine 50 mg TID   #Normocytic anemia Hgb stable. Iron checked and lvl is 56 with 16 percent saturation, ferritin of 28. With leg cramps will have goal of ferritin of 70.  Plan: -Continue Protonix 40 mg BID -Avoid NSAIDS except celebrex 200 mg BID due to gastritis -Continue iron 325 mg qd -CTM hgb   #Hypertension BP was well controlled on current regimen. Blood pressures have been normotensive.  Plan: -Continue Amlodipine 10 mg qd -Continue losartan 100 mg qd    Prior to Admission Living Arrangement: Home Anticipated Discharge Location: Home Barriers to Discharge: Continued antibiotic therapy Dispo: Anticipated discharge to Home in 7 days pending switch to PO antibiotics.   Scarlett Presto, MD Internal Medicine Resident PGY-1 Pager [phone] Please contact the on call pager after 5 pm and on weekends at [phone].

## 2021-05-10 NOTE — Progress Notes (Signed)
Pharmacy Antibiotic Note  Brenda [name] is a 61 y.o. female admitted on 04/15/2021 with  septic joint . Pharmacy consulted for vancomycin dosing.  Renal function remains stable with SCr 0.77. Stop dates are in for abx. Previous vancomycin trough at goal, next level planned for Tuesday if patient remains on IV vancomycin.   Antibiotics:  Vanc 01/04 >> (2/14) CTX  01/04 >> (2/14)   Levels /AUC and dose adjustments for Vanc: 04/23/21: VP 13, VT 4 AUC 223 -> on 750 q24h>  adjust dose to 750 mg q12h  1/17- VP 23, VT 13, AUC:482, cont 750 q12h 1/24 vanc trough 17   Microbiology: 1/04 Bcx: Negative  1/05 Hepatitis w/u: immunity to a b &c  Plan: Continue vancomycin 750 mg IV q12h Re-check Vancomycin trough level weekly (Tue) Scr and VT, next due 1/31 0530   F/u plans to switch to oral antibiotics  Height: 5\' 4"  (162.6 cm) Weight: 42.5 kg (93 lb 11.1 oz) IBW/kg (Calculated) : 54.7  Temp (24hrs), Avg:98.1 F (36.7 C), Min:97.8 F (36.6 C), Max:98.3 F (36.8 C)  Recent Labs  Lab 05/04/21 0332 05/06/21 0556 05/09/21 0358 05/10/21 0320  WBC 8.2  --  8.6 9.3  CREATININE 0.74 0.83 0.81 0.77  VANCOTROUGH  --  17  --   --      Estimated Creatinine Clearance: 50.8 mL/min (by C-G formula based on SCr of 0.77 mg/dL).    No Known Allergies  Donald Pore, PharmD Pharmacy Resident 05/10/2021, 9:17 AM

## 2021-05-11 DIAGNOSIS — M4650 Other infective spondylopathies, site unspecified: Secondary | ICD-10-CM | POA: Diagnosis not present

## 2021-05-11 LAB — GLUCOSE, CAPILLARY
Glucose-Capillary: 100 mg/dL — ABNORMAL HIGH (ref 70–99)
Glucose-Capillary: 100 mg/dL — ABNORMAL HIGH (ref 70–99)

## 2021-05-11 LAB — BASIC METABOLIC PANEL
Anion gap: 8 (ref 5–15)
BUN: 36 mg/dL — ABNORMAL HIGH (ref 6–20)
CO2: 25 mmol/L (ref 22–32)
Calcium: 9.5 mg/dL (ref 8.9–10.3)
Chloride: 103 mmol/L (ref 98–111)
Creatinine, Ser: 0.88 mg/dL (ref 0.44–1.00)
GFR, Estimated: >60 mL/min (ref >60)
Glucose, Bld: 111 mg/dL — ABNORMAL HIGH (ref 70–99)
Potassium: 5.1 mmol/L (ref 3.5–5.1)
Sodium: 136 mmol/L (ref 135–145)

## 2021-05-11 LAB — CBC
HCT: 33.7 % — ABNORMAL LOW (ref 36.0–46.0)
Hemoglobin: 10.5 g/dL — ABNORMAL LOW (ref 12.0–15.0)
MCH: 28.8 pg (ref 26.0–34.0)
MCHC: 31.2 g/dL (ref 30.0–36.0)
MCV: 92.6 fL (ref 80.0–100.0)
Platelets: 230 10*3/uL (ref 150–400)
RBC: 3.64 MIL/uL — ABNORMAL LOW (ref 3.87–5.11)
RDW: 15.7 % — ABNORMAL HIGH (ref 11.5–15.5)
WBC: 10.5 10*3/uL (ref 4.0–10.5)
nRBC: 0 % (ref 0.0–0.2)

## 2021-05-11 MED ORDER — KETOROLAC TROMETHAMINE 15 MG/ML IJ SOLN
15.0000 mg | Freq: Once | INTRAMUSCULAR | Status: AC | PRN
  Administered 2021-05-11: 15 mg via INTRAVENOUS
  Filled 2021-05-11: qty 1

## 2021-05-11 NOTE — Progress Notes (Addendum)
HD#25 Subjective:  Overnight Events: NAEO   Patient says she is still having trouble on and off with sleeping but does think she is getting better. She is concerned about the pustules on her legs.   Objective:  Vital signs in last 24 hours: Vitals:   05/10/21 2042 05/11/21 0013 05/11/21 0404 05/11/21 0500  BP: (!) 118/42 (!) 111/55 (!) 115/52   Pulse: 73 72 70   Resp: 18 17 18    Temp: 97.7 F (36.5 C) 97.6 F (36.4 C) 97.8 F (36.6 C)   TempSrc: Oral Oral Oral   SpO2: 95% 95% 96%   Weight:    43 kg  Height:       Supplemental O2: Room Air SpO2: 96 %   Physical Exam:  Constitutional: Chronically ill appearing female resting comfortably in bed, in no acute distress  HENT: normocephalic atraumatic, mucous membranes moist Eyes: conjunctiva non-erythematous Neck: supple Cardiovascular: regular rate and rhythm, no m/r/g Pulmonary/Chest: normal work of breathing on room air Abdominal: soft, non-tender, non-distended MSK: normal bulk and tone Neurological: alert & oriented x 3 Skin: warm and dry; heme crusted ruptured pustules on the legs Psych: normal affect  Filed Weights   04/16/21 1726 04/16/21 2350 05/11/21 0500  Weight: 47.2 kg 42.5 kg 43 kg    No intake or output data in the 24 hours ending 05/11/21 0658 Net IO Since Admission: 9,623.14 mL [05/11/21 0658]  Pertinent Labs: CBC Latest Ref Rng & Units 05/11/2021 05/10/2021 05/09/2021  WBC 4.0 - 10.5 K/uL 10.5 9.3 8.6  Hemoglobin 12.0 - 15.0 g/dL 10.5(L) 10.4(L) 10.0(L)  Hematocrit 36.0 - 46.0 % 33.7(L) 31.7(L) 31.0(L)  Platelets 150 - 400 K/uL 230 215 207    CMP Latest Ref Rng & Units 05/11/2021 05/10/2021 05/09/2021  Glucose 70 - 99 mg/dL 111(H) 112(H) 117(H)  BUN 6 - 20 mg/dL 36(H) 34(H) 36(H)  Creatinine 0.44 - 1.00 mg/dL 0.88 0.77 0.81  Sodium 135 - 145 mmol/L 136 137 135  Potassium 3.5 - 5.1 mmol/L 5.1 4.9 5.3(H)  Chloride 98 - 111 mmol/L 103 104 103  CO2 22 - 32 mmol/L 25 25 25   Calcium 8.9 - 10.3  mg/dL 9.5 9.5 9.4  Total Protein 6.5 - 8.1 g/dL - - -  Total Bilirubin 0.3 - 1.2 mg/dL - - -  Alkaline Phos 38 - 126 U/L - - -  AST 15 - 41 U/L - - -  ALT 0 - 44 U/L - - -    Imaging: No results found.  Assessment/Plan:   Principal Problem:   Septic joint ([place]) Active Problems:   Essential hypertension   Opioid use disorder, moderate, dependence (HCC)   Protein-calorie malnutrition, severe   Opiate withdrawal ([place])   Pustules determined by examination   Patient Summary: Brenda [name] is a 61 y.o. with a pertinent PMH of cervical spondylosis, opioid use disorder, hypertension, hyperlipidemia admitted with septic facet joint L2-L3 on IV antibiotics.   #L2-L3 Discitis #Chronic back pain Diagnosis supported by MRI imaging findings. ID following. Patient on empiric IV antibiotics, Ceftriaxone 2 g qd and Vancomycin 750 mg qd until 05/27/2021, will deescalate to oral therapy next week. -Continue IV Ceftriaxone and Vancomycin -dalbavancin plus oral abtx (likely doxy) if leaves AMA. -Continue Suboxone 8-2 mg BID -Scheduled tylenol 1000 mg TID, Scheduled Celebrex 200 mg BID. -Schedule Robaxin 500 mg TID; titrate up if needed daily limit 4g/day for increased pain -Continue lyrica 50 mg TID -Will try to avoid other NSAIDs due  to her gastritis.   #Brain fog Patient mentions feeling spaced out and has the feeling that some time has passed without her realizing it. Does not fall asleep or lose consciousness during these episodes and they do not happen when anyone is around to witness them. She is not sure if it might be related to [place]. Episodes started 3-4 days ago which coincided with increased lyrica dose - Will decrease lyrica dose back to 50 TID - CTM   #MRSA Skin Pustules Patient states these have occurred during this hospitalization.  Started as raised violaceous lesions that subsequently ulcerated with drainage. ID suspect these are skin pustules 2/2 MRSA  infection. Plan: -Continue mupirocin 2%  BID   #Opioid use disorder Patient on Suboxone since 04/29/21. Appears to be tolerating it well. States pain is doing well today. Can increase Suboxone to TID starting at 1/2 dose on third dosing if needed for pain.  Plan: -Pain regimen including Suboxone as above   #Underweight BMI 17.84 on admission. RD following. Patient reports good po intake, drinking ensure shakes, reports weight gain since admission. Plan: -Nutrition following, appreciate recommendations  -Multivitamin, Ensure Enlive 3 times daily, snacks -Measure patient weight today   #Anxiety Appears multifactorial. Started on hydroxyzine 50 mg TID. Will hold off on SSRI given her prolonged Qtc. Ativan was discontinued.   Plan: -Continue Hydroxyzine 50 mg TID   #Normocytic anemia Hgb stable. Iron checked and lvl is 56 with 16 percent saturation, ferritin of 28. With leg cramps will have goal of ferritin of 70.  Plan: -Continue Protonix 40 mg BID -Avoid NSAIDS except celebrex 200 mg BID due to gastritis -Continue iron 325 mg qd -CTM hgb   #Hypertension BP was well controlled on current regimen. Blood pressures have been normotensive.  Plan: -Continue Amlodipine 10 mg qd -Continue losartan 100 mg qd    Prior to Admission Living Arrangement: Home Anticipated Discharge Location: Home Barriers to Discharge: Continued antibiotic therapy Dispo: Anticipated discharge to Home in 7 days pending switch to PO antibiotics.     Scarlett Presto, MD Internal Medicine Resident PGY-1 Pager [phone] Please contact the on call pager after 5 pm and on weekends at [phone].

## 2021-05-12 DIAGNOSIS — D649 Anemia, unspecified: Secondary | ICD-10-CM

## 2021-05-12 DIAGNOSIS — L089 Local infection of the skin and subcutaneous tissue, unspecified: Secondary | ICD-10-CM | POA: Diagnosis not present

## 2021-05-12 DIAGNOSIS — R636 Underweight: Secondary | ICD-10-CM

## 2021-05-12 DIAGNOSIS — B9562 Methicillin resistant Staphylococcus aureus infection as the cause of diseases classified elsewhere: Secondary | ICD-10-CM

## 2021-05-12 DIAGNOSIS — M549 Dorsalgia, unspecified: Secondary | ICD-10-CM

## 2021-05-12 DIAGNOSIS — F419 Anxiety disorder, unspecified: Secondary | ICD-10-CM

## 2021-05-12 DIAGNOSIS — F1120 Opioid dependence, uncomplicated: Secondary | ICD-10-CM | POA: Diagnosis not present

## 2021-05-12 DIAGNOSIS — G8929 Other chronic pain: Secondary | ICD-10-CM

## 2021-05-12 DIAGNOSIS — M4646 Discitis, unspecified, lumbar region: Principal | ICD-10-CM

## 2021-05-12 DIAGNOSIS — M4650 Other infective spondylopathies, site unspecified: Secondary | ICD-10-CM | POA: Diagnosis not present

## 2021-05-12 DIAGNOSIS — D72829 Elevated white blood cell count, unspecified: Secondary | ICD-10-CM

## 2021-05-12 LAB — CBC
HCT: 33 % — ABNORMAL LOW (ref 36.0–46.0)
Hemoglobin: 10.6 g/dL — ABNORMAL LOW (ref 12.0–15.0)
MCH: 29.4 pg (ref 26.0–34.0)
MCHC: 32.1 g/dL (ref 30.0–36.0)
MCV: 91.7 fL (ref 80.0–100.0)
Platelets: 235 10*3/uL (ref 150–400)
RBC: 3.6 MIL/uL — ABNORMAL LOW (ref 3.87–5.11)
RDW: 15.6 % — ABNORMAL HIGH (ref 11.5–15.5)
WBC: 11.2 10*3/uL — ABNORMAL HIGH (ref 4.0–10.5)
nRBC: 0 % (ref 0.0–0.2)

## 2021-05-12 LAB — BASIC METABOLIC PANEL
Anion gap: 8 (ref 5–15)
BUN: 38 mg/dL — ABNORMAL HIGH (ref 6–20)
CO2: 25 mmol/L (ref 22–32)
Calcium: 9.7 mg/dL (ref 8.9–10.3)
Chloride: 104 mmol/L (ref 98–111)
Creatinine, Ser: 0.88 mg/dL (ref 0.44–1.00)
GFR, Estimated: >60 mL/min (ref >60)
Glucose, Bld: 102 mg/dL — ABNORMAL HIGH (ref 70–99)
Potassium: 5.2 mmol/L — ABNORMAL HIGH (ref 3.5–5.1)
Sodium: 137 mmol/L (ref 135–145)

## 2021-05-12 MED ORDER — SODIUM CHLORIDE 0.9 % IV SOLN
2.0000 g | Freq: Two times a day (BID) | INTRAVENOUS | Status: DC
  Administered 2021-05-12 – 2021-05-15 (×7): 2 g via INTRAVENOUS
  Filled 2021-05-12 (×7): qty 2

## 2021-05-12 NOTE — Progress Notes (Signed)
HD#26 SUBJECTIVE:  Patient Summary: Brenda [name] is a 61 y.o. with a pertinent PMH of cervical spine spondylosis, OUD, HTN, HLD, who is admitted for septic facet joint L2-L3.   Overnight Events: None  Interim History: Patient states that she feels about the same today.  She reports continued muscle pains in her legs as well as persistent jerking movements in her arms mostly noted at night for when it is quiet and calm.  She does state that she feels that she "nods off" due to her medicine and thinks that it is related to Ambien.  She is concerned that she is still experiencing the effects during the day because of several interruptions during the night that prevent her from getting adequate sleep.   OBJECTIVE:  Vital Signs: Vitals:   05/12/21 0402 05/12/21 0500 05/12/21 0753 05/12/21 1134  BP: (!) 114/46  (!) 110/48 (!) 109/49  Pulse: 61  69 71  Resp: 18  16   Temp: 98 F (36.7 C)  98 F (36.7 C) 98.2 F (36.8 C)  TempSrc: Oral  Oral Oral  SpO2: 95%   94%  Weight:  44 kg    Height:       Supplemental O2: Room Air SpO2: 94 %  Filed Weights   04/16/21 2350 05/11/21 0500 05/12/21 0500  Weight: 42.5 kg 43 kg 44 kg     Intake/Output Summary (Last 24 hours) at 05/12/2021 1143 Last data filed at 05/11/2021 1257 Gross per 24 hour  Intake 237 ml  Output --  Net 237 ml   Net IO Since Admission: 10,220.14 mL [05/12/21 1143]  Physical Exam: Constitutional: Chronically ill-appearing female in no acute distress. Cardio: Regular rate and rhythm.  No murmurs, rubs, gallops. Pulm: Normal work of breathing on room air. Abdomen: Soft, nontender, nondistended. MSK: Negative for extremity edema. Skin: Several draining pustules over the anterior lateral aspect of the right thigh. Neuro: Alert and oriented x3.  No focal deficit noted. Psych: Normal mood and affect.  Patient Lines/Drains/Airways Status     Active Line/Drains/Airways     Name Placement date Placement time Site Days    Peripheral IV 04/21/21 20 G 1" Left;Lateral Forearm 04/21/21  1153  Forearm  21   PICC Single Lumen 04/21/21 Right Brachial 34 cm 0 cm 04/21/21  1710  Brachial  21   Wound / Incision (Open or Dehisced) 05/11/21 Other (Comment) Thigh Anterior;Right Blister like areas that are opening up and oozing 05/11/21  1330  Thigh  1   Wound / Incision (Open or Dehisced) 05/11/21 Other (Comment) Thigh Anterior;Left Blister like areas that are oozing 05/11/21  1330  Thigh  1             ASSESSMENT/PLAN:  Assessment: Principal Problem:   Septic joint (HCC) Active Problems:   Essential hypertension   Opioid use disorder, moderate, dependence (HCC)   Protein-calorie malnutrition, severe   Opiate withdrawal ([place])   Pustules determined by examination   Plan: #L2-L3 Discitis #Chronic back pain Diagnosis supported by MRI imaging findings.  Patient has been receiving empiric IV antibiotics of ceftriaxone 2 g daily and vancomycin 750 mg daily with end date of 05/27/2021. -Infectious disease following, appreciate their assistance. -Transition to dalbavancin plus oral antibiotics, likely doxycycline at this time.   -Follow-up oral antibiotic recommendation. -Continue Suboxone 8-2 mg BID -Scheduled tylenol 1000 mg TID, Scheduled Celebrex 200 mg BID. -Schedule Robaxin 500 mg TID; titrate up if needed daily limit 4g/day for increased pain -Continue  lyrica 50 mg TID -Avoid other NSAIDs if possible due to her gastritis.   #Brain fog Patient again endorses "nodding off" during the day and she believes it is related to Ambien and not getting restful sleep during the night due to multiple sleep interruptions.  She denies history of adverse effects with Lyrica and does not feel her symptoms are related at this time. -Continue Lyrica 50 mg 3 times daily -CTM   #MRSA Skin Pustules Several draining pustules over the anterior and lateral aspect of the right thigh, new to the patient during this admission.   Infectious disease suspects that these are skin pustule secondary to MRSA infection. -Continue mupirocin 2%  BID   #Opioid use disorder Patient stable on Suboxone since 04/29/21.  She h denies currently experiencing any pain and has not had adverse effects since beginning Suboxone.   -Continue Suboxone.  Can increase to 3 times daily that half dose on the third dose if needed for worsening pain.     #Underweight BMI 17.84 on admission.  Patient endorses good p.o. intake.  Weight is up 1.5 kg since admission. -Nutrition following, appreciate their assistance. -Multivitamin, Ensure Enlive 3 times daily, snacks   #Anxiety Likely multifactorial.  QTc was previously noted to be prolonged, avoid SSRI. -Continue Hydroxyzine 50 mg TID   #Normocytic anemia Hgb stable.  Iron studies collected 01/20 showed iron 56 with 16 percent saturation, ferritin of 28.  Given reported leg cramps, we will set ferritin goal to 70.  -Continue Protonix 40 mg BID -Avoid NSAIDS except celebrex 200 mg BID due to gastritis -Continue iron 325 mg daily -Trend CBC   #Hypertension Patient continues to be normotensive on current regimen. -Continue Amlodipine 10 mg daily -Continue losartan 100 mg daily  Best Practice: Diet: Regular diet IVF: Fluids: IV push only, no IV fluids VTE: rivaroxaban (XARELTO) tablet 10 mg Start: 04/22/21 1000 Code: DNR AB: Cefepime 2 g daily, vancomycin 750 mg twice daily DISPO: Anticipated discharge in 1-2 days to Home pending  medication adjustments .  Signature: Farrel Gordon, D.O.  Internal Medicine Resident, PGY-1 [facility]  Pager: [phone] 11:43 AM, 05/12/2021   Please contact the on call pager after 5 pm and on weekends at [phone].

## 2021-05-12 NOTE — Progress Notes (Signed)
[facility] for Infectious Disease    Date of Admission:  04/15/2021   Total days of antibiotics 27/ceftriaxone plus vancomycin           ID: Brenda [name] is a 61 y.o. female with  opiate use disorder with discitis in addition to skin pustules Principal Problem:   Septic joint ([place]) Active Problems:   Essential hypertension   Opioid use disorder, moderate, dependence (HCC)   Protein-calorie malnutrition, severe   Opiate withdrawal (HCC)   Pustules determined by examination    Subjective: Tenderness to skin lesions/pustules limited to bilateral thighs; no pain with picc line  Medications:   acetaminophen  1,000 mg Oral TID   amLODipine  10 mg Oral Daily   buprenorphine-naloxone  1 tablet Sublingual BID   celecoxib  200 mg Oral BID   Chlorhexidine Gluconate Cloth  6 each Topical Daily   cholecalciferol  2,000 Units Oral Daily   feeding supplement  237 mL Oral TID BM   ferrous sulfate  325 mg Oral Q breakfast   hydrOXYzine  50 mg Oral TID   losartan  100 mg Oral Daily   melatonin  3 mg Oral QHS   methocarbamol  500 mg Oral TID   multivitamin with minerals  1 tablet Oral Daily   mupirocin cream   Topical BID   nicotine  14 mg Transdermal Daily   pantoprazole  40 mg Oral BID   pregabalin  50 mg Oral TID   rivaroxaban  10 mg Oral Daily   zolpidem  5 mg Oral QHS    Objective: Vital signs in last 24 hours: Temp:  [97.8 F (36.6 C)-99 F (37.2 C)] 98.2 F (36.8 C) (01/30 1134) Pulse Rate:  [61-78] 71 (01/30 1134) Resp:  [16-18] 16 (01/30 0753) BP: (89-114)/(46-55) 109/49 (01/30 1134) SpO2:  [94 %-96 %] 94 % (01/30 1134) Weight:  [44 kg] 44 kg (01/30 0500) Physical Exam  Constitutional:  oriented to person, place, and time. appears well-developed and well-nourished. No distress.  HENT: [place]/AT, PERRLA, no scleral icterus Mouth/Throat: Oropharynx is clear and moist. No oropharyngeal exudate.  Cardiovascular: Normal rate, regular rhythm and normal heart sounds.  Exam reveals no gallop and no friction rub.  No murmur heard.  Pulmonary/Chest: Effort normal and breath sounds normal. No respiratory distress.  has no wheezes.  Neck = supple, no nuchal rigidity Abdominal: Soft. Bowel sounds are normal.  exhibits no distension. There is no tenderness.  Lymphadenopathy: no cervical adenopathy. No axillary adenopathy Neurological: alert and oriented to person, place, and time.  Skin: Skin is warm and dry. Scattered pustules , quarter size on her thighs. Some still evolving. Roughly a total of 8-10 lesions Psychiatric: a normal mood and affect.  behavior is normal.   Lab Results Recent Labs    05/11/21 0239 05/12/21 0228  WBC 10.5 11.2*  HGB 10.5* 10.6*  HCT 33.7* 33.0*  NA 136 137  K 5.1 5.2*  CL 103 104  CO2 25 25  BUN 36* 38*  CREATININE 0.88 0.88   Lab Results  Component Value Date   ESRSEDRATE 46 (H) 04/16/2021    Microbiology: reviewed Studies/Results: No results found.   Assessment/Plan: Skin pustules = initially thought to be MRSA but could be pseudomonal infection as one can see with ecthyma gangrenosum. Will change ceftriaxone to cefepime. Plan for 2days of cefepime. If continues to improve, will change the remaining with levofloxacin plus one dose of dalbavancin. Continue with wound care. Differential can  also include pyoderma gangrenosum but patient deferred getting skin biopsy. Recommend to get superficial skin swab off of one area that has purulent drainage.  Discitis = continue with vancomycin plus cefepime  Leukocytosis = could be due to response associated with worsening skin lesions  Opiate use disorder = defer to primary team for management  [facility] for Infectious Diseases Pager: [phone]  05/12/2021, 12:57 PM

## 2021-05-13 DIAGNOSIS — D72829 Elevated white blood cell count, unspecified: Secondary | ICD-10-CM | POA: Diagnosis not present

## 2021-05-13 DIAGNOSIS — L089 Local infection of the skin and subcutaneous tissue, unspecified: Secondary | ICD-10-CM | POA: Diagnosis not present

## 2021-05-13 DIAGNOSIS — M4646 Discitis, unspecified, lumbar region: Secondary | ICD-10-CM | POA: Diagnosis not present

## 2021-05-13 DIAGNOSIS — R229 Localized swelling, mass and lump, unspecified: Secondary | ICD-10-CM | POA: Diagnosis not present

## 2021-05-13 DIAGNOSIS — F1110 Opioid abuse, uncomplicated: Secondary | ICD-10-CM | POA: Diagnosis not present

## 2021-05-13 DIAGNOSIS — M549 Dorsalgia, unspecified: Secondary | ICD-10-CM | POA: Diagnosis not present

## 2021-05-13 DIAGNOSIS — B9562 Methicillin resistant Staphylococcus aureus infection as the cause of diseases classified elsewhere: Secondary | ICD-10-CM | POA: Diagnosis not present

## 2021-05-13 LAB — CBC WITH DIFFERENTIAL/PLATELET
Abs Immature Granulocytes: 0.04 10*3/uL (ref 0.00–0.07)
Basophils Absolute: 0.1 10*3/uL (ref 0.0–0.1)
Basophils Relative: 1 %
Eosinophils Absolute: 2.3 10*3/uL — ABNORMAL HIGH (ref 0.0–0.5)
Eosinophils Relative: 26 %
HCT: 31.5 % — ABNORMAL LOW (ref 36.0–46.0)
Hemoglobin: 10.3 g/dL — ABNORMAL LOW (ref 12.0–15.0)
Immature Granulocytes: 1 %
Lymphocytes Relative: 31 %
Lymphs Abs: 2.8 10*3/uL (ref 0.7–4.0)
MCH: 29.6 pg (ref 26.0–34.0)
MCHC: 32.7 g/dL (ref 30.0–36.0)
MCV: 90.5 fL (ref 80.0–100.0)
Monocytes Absolute: 0.7 10*3/uL (ref 0.1–1.0)
Monocytes Relative: 8 %
Neutro Abs: 2.9 10*3/uL (ref 1.7–7.7)
Neutrophils Relative %: 33 %
Platelets: 222 10*3/uL (ref 150–400)
RBC: 3.48 MIL/uL — ABNORMAL LOW (ref 3.87–5.11)
RDW: 15.6 % — ABNORMAL HIGH (ref 11.5–15.5)
WBC: 8.9 10*3/uL (ref 4.0–10.5)
nRBC: 0 % (ref 0.0–0.2)

## 2021-05-13 LAB — CREATININE, SERUM
Creatinine, Ser: 0.76 mg/dL (ref 0.44–1.00)
GFR, Estimated: >60 mL/min (ref >60)

## 2021-05-13 LAB — BASIC METABOLIC PANEL
Anion gap: 7 (ref 5–15)
BUN: 31 mg/dL — ABNORMAL HIGH (ref 6–20)
CO2: 24 mmol/L (ref 22–32)
Calcium: 9.6 mg/dL (ref 8.9–10.3)
Chloride: 106 mmol/L (ref 98–111)
Creatinine, Ser: 0.7 mg/dL (ref 0.44–1.00)
GFR, Estimated: >60 mL/min (ref >60)
Glucose, Bld: 111 mg/dL — ABNORMAL HIGH (ref 70–99)
Potassium: 5.7 mmol/L — ABNORMAL HIGH (ref 3.5–5.1)
Sodium: 137 mmol/L (ref 135–145)

## 2021-05-13 LAB — PATHOLOGIST SMEAR REVIEW

## 2021-05-13 LAB — VANCOMYCIN, TROUGH: Vancomycin Tr: 24 ug/mL (ref 15–20)

## 2021-05-13 MED ORDER — SODIUM ZIRCONIUM CYCLOSILICATE 10 G PO PACK
10.0000 g | PACK | Freq: Two times a day (BID) | ORAL | Status: AC
  Administered 2021-05-13 (×2): 10 g via ORAL
  Filled 2021-05-13 (×2): qty 1

## 2021-05-13 MED ORDER — ORITAVANCIN DIPHOSPHATE 400 MG IV SOLR
1200.0000 mg | Freq: Once | INTRAVENOUS | Status: AC
  Administered 2021-05-14: 1200 mg via INTRAVENOUS
  Filled 2021-05-13: qty 120

## 2021-05-13 MED ORDER — HYDROCHLOROTHIAZIDE 12.5 MG PO TABS
6.2500 mg | ORAL_TABLET | Freq: Every day | ORAL | Status: DC
  Administered 2021-05-14 – 2021-05-20 (×6): 6.25 mg via ORAL
  Filled 2021-05-13 (×6): qty 1

## 2021-05-13 MED ORDER — LEVOFLOXACIN 750 MG PO TABS: 750.0000 mg | ORAL_TABLET | Freq: Every day | ORAL | 0 refills | Status: DC

## 2021-05-13 NOTE — Progress Notes (Signed)
Pharmacy Antibiotic Note  Brenda [name] is a 61 y.o. female admitted on 04/15/2021 with  septic joint . Pharmacy consulted for vancomycin dosing, treating for SSTI.   Antibiotics:  vancomycin 1/04 >>   ceftriaxone 1/04 > 1/30   cefepime 1/30 >>    Assessment:  On vancomycin 750 mg IV q12h.  Supratherapeutic vancomycin trough drawn early on 01/31 at 03:54 - 24 ug/mL, prior dose given 1/30 at 18:07. True trough likely closer to 20, with one dose of vancomycin remaining.    Microbiology: 1/04 Bcx: Negative  1/05 Hepatitis w/u: immunity to a b &c  Plan: continue vancomycin 750 mg IV q12h for last dose due at 1800   oritavancin IV x 1 tomorrow to complete final 2 weeks of therapy  will plan to send two weeks of levofloxacin to TOC for discharge    Height: 5\' 4"  (162.6 cm) Weight: 47.9 kg (105 lb 9.6 oz) IBW/kg (Calculated) : 54.7  Temp (24hrs), Avg:98.2 F (36.8 C), Min:97.7 F (36.5 C), Max:98.9 F (37.2 C)  Recent Labs  Lab 05/09/21 0358 05/10/21 0320 05/11/21 0239 05/12/21 0228 05/13/21 0354 05/13/21 0742  WBC 8.6 9.3 10.5 11.2*  --  8.9  CREATININE 0.81 0.77 0.88 0.88 0.76 0.70  VANCOTROUGH  --   --   --   --  24*  --      Estimated Creatinine Clearance: 57.3 mL/min (by C-G formula based on SCr of 0.7 mg/dL).    No Known Allergies  Eliseo Gum, PharmD Candidate  05/13/2021 11:37 AM

## 2021-05-13 NOTE — Progress Notes (Signed)
[facility] for Infectious Disease    Date of Admission:  04/15/2021   Total days of antibiotics 28   ID: Brenda [name] is a 61 y.o. female with  lumbar septic joint/discitis, with opiate use disorder Principal Problem:   Septic joint ([place]) Active Problems:   Essential hypertension   Leukocytosis   Opioid use disorder, moderate, dependence (HCC)   Protein-calorie malnutrition, severe   Opiate withdrawal ([place])   Pustules determined by examination    Subjective: Skin lesions still ongoing afebrile. Slightly less pain to skin lesions  Medications:   acetaminophen  1,000 mg Oral TID   amLODipine  10 mg Oral Daily   buprenorphine-naloxone  1 tablet Sublingual BID   celecoxib  200 mg Oral BID   Chlorhexidine Gluconate Cloth  6 each Topical Daily   cholecalciferol  2,000 Units Oral Daily   feeding supplement  237 mL Oral TID BM   ferrous sulfate  325 mg Oral Q breakfast   [START ON 05/14/2021] hydrochlorothiazide  6.25 mg Oral Daily   hydrOXYzine  50 mg Oral TID   melatonin  3 mg Oral QHS   methocarbamol  500 mg Oral TID   multivitamin with minerals  1 tablet Oral Daily   mupirocin cream   Topical BID   nicotine  14 mg Transdermal Daily   pantoprazole  40 mg Oral BID   pregabalin  50 mg Oral TID   rivaroxaban  10 mg Oral Daily   sodium zirconium cyclosilicate  10 g Oral BID   zolpidem  5 mg Oral QHS    Objective: Vital signs in last 24 hours: Temp:  [97.7 F (36.5 C)-98.9 F (37.2 C)] 98.1 F (36.7 C) (01/31 1141) Pulse Rate:  [60-73] 60 (01/31 1141) Resp:  [12-16] 16 (01/31 1141) BP: (107-152)/(43-58) 133/50 (01/31 1141) SpO2:  [96 %-99 %] 98 % (01/31 1141) Weight:  [47.9 kg] 47.9 kg (01/31 0500)   Physical Exam  Constitutional:  oriented to person, place, and time. appears well-developed and well-nourished. No distress.  HENT: [place]/AT, PERRLA, no scleral icterus Mouth/Throat: Oropharynx is clear and moist. No oropharyngeal exudate.  Cardiovascular: Normal  rate, regular rhythm and normal heart sounds. Exam reveals no gallop and no friction rub.  No murmur heard.  Pulmonary/Chest: Effort normal and breath sounds normal. No respiratory distress.  has no wheezes.  Neck = supple, no nuchal rigidity Abdominal: Soft. Bowel sounds are normal.  exhibits no distension. There is no tenderness.  Lymphadenopathy: no cervical adenopathy. No axillary adenopathy Neurological: alert and oriented to person, place, and time.  Skin: Skin is warm and dry. No rash noted. No erythema. Scattered red nodules, some ulcerated with limited erythema at base on bilateral upper legs Psychiatric: a normal mood and affect.  behavior is normal.    Lab Results Recent Labs    05/12/21 0228 05/13/21 0354 05/13/21 0742  WBC 11.2*  --  8.9  HGB 10.6*  --  10.3*  HCT 33.0*  --  31.5*  NA 137  --  137  K 5.2*  --  5.7*  CL 104  --  106  CO2 25  --  24  BUN 38*  --  31*  CREATININE 0.88 0.76 0.70    Microbiology: reviewed Studies/Results: No results found.   Assessment/Plan: Lumbar septic arthritis/discitis = continue on vancomycin plus cefepime. Plan to do 1 dose of oritavancin tomorrow in addition to transition cefepime to levofloxacin daily x 14 days  Skin lesions =  possibly could be either pyoderma gangrenosum vs. Ecthyma gangrenosum. Patient is open to having skin biopsy of newer lesion. Recommend to send for path and another biopsy sent for aerobic and NTM cx.  Opiate use disorder = continue on suboxone  Will follow up with patient in 10 days in clinic  [facility] for Infectious Diseases Pager: [phone]  05/13/2021, 2:27 PM

## 2021-05-13 NOTE — Progress Notes (Signed)
Nutrition Follow-up  DOCUMENTATION CODES:   Underweight  INTERVENTION:   Continue Multivitamin w/ minerals daily Continue Ensure Enlive to po TID, each supplement provides 350 kcal and 20 grams of protein Continue Snacks    NUTRITION DIAGNOSIS:   Increased nutrient needs related to acute illness as evidenced by estimated needs. - Ongoing  GOAL:   Patient will meet greater than or equal to 90% of their needs - Progressing   MONITOR:   PO intake, Supplement acceptance, Weight trends, Labs, Skin  REASON FOR ASSESSMENT:   Other (Comment) (Low BMI)    ASSESSMENT:   61 y.o female presented to the ED with back pain, nausea, vomiting, and diarrhea. PMH includes polysubstance abuse, HTN, lung cancer, malnutrition, and GERD. Pt admitted with L2-L3 septic joint and possible gastritis.  Pt reports that she is still doing good. Reports that she had a little bit of diarrhea that has now almost resolved. Pt reports that she is drinking her Ensures, but does not like strawberry.  Per EMR, pt intakes include: 1/26: Dinner 100% 1/27: Breakfast 100%, Lunch 100% 1/28: Breakfast 50%  Pt reports that she has only received her snack 1 times since RD ordered them last week. Pt gave RD new snack ideas, RD to reorder snacks.   Pt with no other questions or concerns.   Medications reviewed and include: Vitamin D3, Ferrous Sulfate, Robaxin, MVI, Protonix, IV antibiotics  Labs reviewed: - Potassium 5.7  - BUN 31  Diet Order:   Diet Order             Diet regular Room service appropriate? Yes; Fluid consistency: Thin  Diet effective now                   EDUCATION NEEDS:   No education needs have been identified at this time  Skin:  Skin Assessment: Reviewed RN Assessment  Last BM:  1/30  Height:   Ht Readings from Last 1 Encounters:  04/16/21 5\' 4"  (1.626 m)    Weight:   Wt Readings from Last 1 Encounters:  05/13/21 47.9 kg    Ideal Body Weight:  54.6 kg  BMI:   Body mass index is 18.13 kg/m.  Estimated Nutritional Needs:   Kcal:  1500-1700  Protein:  75-90 grams  Fluid:  >/= 1.5 L    Wyeth Hoffer Louie Casa, RD, LDN Clinical Dietitian See [facility] for contact information.

## 2021-05-13 NOTE — Progress Notes (Addendum)
HD#27 SUBJECTIVE:  Patient Summary: Brenda [name] is a 61 y.o. with a pertinent PMH of cervical spine spondylosis, OUD, HTN, HLD, who is admitted for septic facet joint L2-L3.   Overnight Events: None.  Interim History: Patient is resting comfortably in bed on exam.  She continues to endorse neuropathic pain and extremity jerking as previously noted.    OBJECTIVE:  Vital Signs: Vitals:   05/12/21 1546 05/12/21 2227 05/13/21 0500 05/13/21 0543  BP: (!) 107/43 (!) 115/52  (!) 152/58  Pulse: 69 72  73  Resp: 16 12    Temp: 97.9 F (36.6 C) 98.9 F (37.2 C)  98.3 F (36.8 C)  TempSrc: Oral Oral  Oral  SpO2: 96% 96%  99%  Weight:   47.9 kg   Height:       Supplemental O2: Room Air SpO2: 99 %  Filed Weights   05/11/21 0500 05/12/21 0500 05/13/21 0500  Weight: 43 kg 44 kg 47.9 kg     Intake/Output Summary (Last 24 hours) at 05/13/2021 8250 Last data filed at 05/13/2021 0548 Gross per 24 hour  Intake 470 ml  Output --  Net 470 ml   Net IO Since Admission: 10,690.14 mL [05/13/21 0625]  Physical Exam: Constitutional: Chronically ill-appearing female in no acute distress. Cardio: Regular rate and rhythm.  No murmurs, rubs, gallops. Pulm: Normal work of breathing on room air. MSK: Negative for extremity edema. Skin: Several draining pustules of the anterior aspects of bilateral thighs. Neuro: Alert and oriented x3.  No focal deficit noted. Psych: Normal mood and affect.  Patient Lines/Drains/Airways Status     Active Line/Drains/Airways     Name Placement date Placement time Site Days   Peripheral IV 04/21/21 20 G 1" Left;Lateral Forearm 04/21/21  1153  Forearm  22   PICC Single Lumen 04/21/21 Right Brachial 34 cm 0 cm 04/21/21  1710  Brachial  22   Wound / Incision (Open or Dehisced) 05/11/21 Other (Comment) Thigh Anterior;Right Blister like areas that are opening up and oozing 05/11/21  1330  Thigh  2   Wound / Incision (Open or Dehisced) 05/11/21 Other (Comment)  Thigh Anterior;Left Blister like areas that are oozing 05/11/21  1330  Thigh  2             ASSESSMENT/PLAN:  Assessment: Principal Problem:   Septic joint (HCC) Active Problems:   Essential hypertension   Leukocytosis   Opioid use disorder, moderate, dependence (HCC)   Protein-calorie malnutrition, severe   Opiate withdrawal ([place])   Pustules determined by examination   Plan: #L2-L3 Discitis #Chronic back pain Diagnosis supported by MRI imaging findings.  Patient has been receiving empiric IV antibiotics of ceftriaxone 2 g daily and vancomycin 750 mg daily with end date of 05/27/2021. -Infectious disease following, appreciate their assistance. -Will change ceftriaxone to cefepime for 2 days, with plan to discharge on levofloxacin plus one dose of dalbavancin. -Continue Suboxone 8-2 mg BID -Scheduled tylenol 1000 mg TID, Scheduled Celebrex 200 mg BID. -Schedule Robaxin 500 mg TID; titrate up if needed daily limit 4g/day for increased pain -Continue lyrica 50 mg TID -Avoid other NSAIDs if possible due to her gastritis.   #Brain fog Patient reports persistent limb jerking "nodding off" during the day.  She has reassured that with time the symptoms will likely settle and dissipate. -Continue Lyrica 50 mg 3 times daily -CTM   #MRSA Skin Pustules Several draining pustules over the anterior and lateral aspects of bilateral thighs, new to  the patient during this admission.   -Continue mupirocin 2%  BID -Swab of pustule drainage collected   #Opioid use disorder Patient stable on Suboxone since 04/29/21.   -Continue Suboxone.  Can increase to 3 times daily that half dose on the third dose if needed for worsening pain.     #Underweight BMI 17.84 on admission.  Patient endorses good p.o. intake.  Weight is up 1.5 kg since admission. -Nutrition following, appreciate their assistance. -Multivitamin, Ensure Enlive 3 times daily, snacks   #Anxiety Likely multifactorial.  QTc was  previously noted to be prolonged, avoid SSRI. -Continue Hydroxyzine 50 mg TID   #Normocytic anemia Hgb stable.  Iron studies collected 01/20 showed iron 56 with 16 percent saturation, ferritin of 28.  Given reported leg cramps, we will set ferritin goal to 70.  -Continue Protonix 40 mg BID -Avoid NSAIDS except celebrex 200 mg BID due to gastritis -Continue iron 325 mg daily -Trend CBC   #Hypertension Patient continues to be normotensive on current regimen.  Given recent hyperkalemia, will discontinue losartan and initiate a thiazide diuretic. -Given Lokelma for acute hyperkalemia -Continue Amlodipine 10 mg daily -Stop losartan 100 mg daily -Start HCTZ 6.25 mg daily  Best Practice: Diet: Regular diet IVF: Fluids: IV push only, no IV fluids VTE: rivaroxaban (XARELTO) tablet 10 mg Start: 04/22/21 1000 Code: DNR AB: Cefepime 2 g daily, vancomycin 750 mg twice daily DISPO: Anticipated discharge in 1-2 days to Home pending  medication adjustments .  Signature: Farrel Gordon, D.O.  Internal Medicine Resident, PGY-1 [facility]  Pager: [phone] 6:25 AM, 05/13/2021   Please contact the on call pager after 5 pm and on weekends at [phone].

## 2021-05-14 ENCOUNTER — Encounter (HOSPITAL_COMMUNITY): Payer: Self-pay | Admitting: Student in an Organized Health Care Education/Training Program

## 2021-05-14 HISTORY — PX: PUNCH BIOPSY OF SKIN: SHX6390

## 2021-05-14 LAB — BASIC METABOLIC PANEL
Anion gap: 7 (ref 5–15)
BUN: 35 mg/dL — ABNORMAL HIGH (ref 6–20)
CO2: 23 mmol/L (ref 22–32)
Calcium: 9.9 mg/dL (ref 8.9–10.3)
Chloride: 108 mmol/L (ref 98–111)
Creatinine, Ser: 0.91 mg/dL (ref 0.44–1.00)
GFR, Estimated: >60 mL/min (ref >60)
Glucose, Bld: 110 mg/dL — ABNORMAL HIGH (ref 70–99)
Potassium: 5.3 mmol/L — ABNORMAL HIGH (ref 3.5–5.1)
Sodium: 138 mmol/L (ref 135–145)

## 2021-05-14 LAB — CBC
HCT: 34 % — ABNORMAL LOW (ref 36.0–46.0)
Hemoglobin: 10.9 g/dL — ABNORMAL LOW (ref 12.0–15.0)
MCH: 29.4 pg (ref 26.0–34.0)
MCHC: 32.1 g/dL (ref 30.0–36.0)
MCV: 91.6 fL (ref 80.0–100.0)
Platelets: 258 10*3/uL (ref 150–400)
RBC: 3.71 MIL/uL — ABNORMAL LOW (ref 3.87–5.11)
RDW: 15.5 % (ref 11.5–15.5)
WBC: 11.4 10*3/uL — ABNORMAL HIGH (ref 4.0–10.5)
nRBC: 0 % (ref 0.0–0.2)

## 2021-05-14 NOTE — Plan of Care (Signed)

## 2021-05-14 NOTE — Procedures (Addendum)
After informed written consent was obtained, using Betadine for cleansing and 1% Lidocaine without epinephrine for anesthetic, with sterile technique two-4 mm punch biopsies was used to obtain a biopsy specimen of the lesion on anterior aspect of right thigh. Hemostasis was obtained by pressure and both wounds were sutured. Antibiotic dressing is applied, and wound care instructions provided. Be alert for any signs of cutaneous infection. The specimen is labeled and sent to pathology for evaluation. The procedure was well tolerated without complications.  Farrel Gordon, DO Internal Medicine PGY-1

## 2021-05-14 NOTE — Progress Notes (Signed)
HD#28 SUBJECTIVE:  Patient Summary: Brenda [name] is a 61 y.o. with a pertinent PMH of cervical spine spondylosis, OUD, HTN, HLD, who is admitted for septic facet joint L2-L3.   Overnight Events: None  Interim History: Patient is resting in bed. No complaints at this time.   OBJECTIVE:  Vital Signs: Vitals:   05/13/21 1602 05/13/21 2013 05/14/21 0001 05/14/21 0427  BP: (!) 116/50 (!) 154/70 (!) 128/45 (!) 116/51  Pulse: 76 72 71   Resp: 16 16 20 19   Temp: 98.7 F (37.1 C) 98.1 F (36.7 C) 98.2 F (36.8 C) 98 F (36.7 C)  TempSrc: Oral Oral Oral Oral  SpO2: 93% 94% 93%   Weight:      Height:       Supplemental O2: Room Air SpO2: 93 %  Filed Weights   05/11/21 0500 05/12/21 0500 05/13/21 0500  Weight: 43 kg 44 kg 47.9 kg     Intake/Output Summary (Last 24 hours) at 05/14/2021 1122 Last data filed at 05/13/2021 1757 Gross per 24 hour  Intake 155.07 ml  Output --  Net 155.07 ml   Net IO Since Admission: 10,995.21 mL [05/14/21 1122]  Physical Exam: Constitutional: Chronically-ill appearing woman resting comfortably in bed. Cardio: Regular rate and rhythm. Systolic murmur noted on exam. Pulm: Clear to auscultation bilaterally. Normal work of breathing on room air. MSK: Negative for extremity edema. Skin: Several draining pustules on anterior aspects of bilateral thighs. Neuro: Alert and oriented x3. No focal deficit noted.  Psych: Normal mood and affect.  Patient Lines/Drains/Airways Status     Active Line/Drains/Airways     Name Placement date Placement time Site Days   Peripheral IV 04/21/21 20 G 1" Left;Lateral Forearm 04/21/21  1153  Forearm  23   PICC Single Lumen 04/21/21 Right Brachial 34 cm 0 cm 04/21/21  1710  Brachial  23   Wound / Incision (Open or Dehisced) 05/11/21 Other (Comment) Thigh Anterior;Right Blister like areas that are opening up and oozing 05/11/21  1330  Thigh  3   Wound / Incision (Open or Dehisced) 05/11/21 Other (Comment) Thigh  Anterior;Left Blister like areas that are oozing 05/11/21  1330  Thigh  3             ASSESSMENT/PLAN:  Assessment: Principal Problem:   Septic joint (HCC) Active Problems:   Essential hypertension   Leukocytosis   Opioid use disorder, moderate, dependence (HCC)   Protein-calorie malnutrition, severe   Opiate withdrawal ([place])   Pustules determined by examination   Multiple skin nodules   Plan: #L2-L3 Discitis #Chronic back pain Diagnosis supported by MRI imaging findings.  Patient has previously received empiric IV antibiotics of ceftriaxone 2 g daily and vancomycin 750 mg daily with end date of 05/27/2021. Ceftriaxone was changed to cefepime 01/31. -Infectious disease following, appreciate their assistance. -Current plan is to discharge on levofloxacin plus one dose of dalbavancin. -Continue Suboxone 8-2 mg BID -Scheduled tylenol 1000 mg TID, Scheduled Celebrex 200 mg BID. -Schedule Robaxin 500 mg TID; titrate up if needed daily limit 4g/day for increased pain -Continue lyrica 50 mg TID -Avoid other NSAIDs if possible due to her gastritis.  #MRSA Skin Pustules Several draining pustules over the anterior and lateral aspects of bilateral thighs, new to the patient during this admission.  Has not had new pustules arise over last 2 days.  -Continue mupirocin 2%  BID -Swab of pustule drainage collected with negative gram stain and culture showing NGTD. -Will plan to biopsy  today   #Opioid use disorder Patient stable on Suboxone since 04/29/21.  Of note a systolic murmur was auscultated on exam today; echocardiogram 01/05 was notable for mitral valve regurgitations well as aortic valve regurgitation. -Continue Suboxone.  Can increase to 3 times daily that half dose on the third dose if needed for worsening pain.   -Consider TEE given IVDU history.   #Brain fog Patient reports persistent limb jerking "nodding off" during the day.  She has reassured that with time the symptoms  will likely settle and dissipate. -Continue Lyrica 50 mg 3 times daily -CTM   #Underweight BMI 17.84 on admission.  Patient endorses good p.o. intake.  Weight is up 1.5 kg since admission. -Nutrition following, appreciate their assistance. -Multivitamin, Ensure Enlive 3 times daily, snacks   #Anxiety Likely multifactorial.  QTc was previously noted to be prolonged, avoid SSRI. -Continue Hydroxyzine 50 mg TID   #Normocytic anemia Hgb stable.  Iron studies collected 01/20 showed iron 56 with 16 percent saturation, ferritin of 28.  Given reported leg cramps, we will set ferritin goal to 70.  -Continue Protonix 40 mg BID -Avoid NSAIDS except celebrex 200 mg BID due to gastritis -Continue iron 325 mg daily -Trend CBC   #Hypertension Patient continues to be normotensive on current regimen.  Given recent hyperkalemia, will discontinue losartan and initiate a thiazide diuretic. -Continue Amlodipine 10 mg daily, HCTZ 6.25 mg daily  Best Practice: Diet: Regular diet IVF: Fluids: IV push only, no IV fluids VTE: rivaroxaban (XARELTO) tablet 10 mg Start: 04/22/21 1000 Code: DNR AB: Cefepime 2 g daily, vancomycin 750 mg twice daily DISPO: Anticipated discharge in 1-2 days to Home pending  medication adjustments .  Signature: Farrel Gordon, D.O.  Internal Medicine Resident, PGY-1 [facility]  Pager: [phone] 11:22 AM, 05/14/2021   Please contact the on call pager after 5 pm and on weekends at [phone].

## 2021-05-15 LAB — BASIC METABOLIC PANEL
Anion gap: 8 (ref 5–15)
BUN: 35 mg/dL — ABNORMAL HIGH (ref 6–20)
CO2: 22 mmol/L (ref 22–32)
Calcium: 9.5 mg/dL (ref 8.9–10.3)
Chloride: 106 mmol/L (ref 98–111)
Creatinine, Ser: 0.76 mg/dL (ref 0.44–1.00)
GFR, Estimated: >60 mL/min (ref >60)
Glucose, Bld: 83 mg/dL (ref 70–99)
Potassium: 4.9 mmol/L (ref 3.5–5.1)
Sodium: 136 mmol/L (ref 135–145)

## 2021-05-15 LAB — CBC
HCT: 30.9 % — ABNORMAL LOW (ref 36.0–46.0)
Hemoglobin: 10.1 g/dL — ABNORMAL LOW (ref 12.0–15.0)
MCH: 29.8 pg (ref 26.0–34.0)
MCHC: 32.7 g/dL (ref 30.0–36.0)
MCV: 91.2 fL (ref 80.0–100.0)
Platelets: 219 10*3/uL (ref 150–400)
RBC: 3.39 MIL/uL — ABNORMAL LOW (ref 3.87–5.11)
RDW: 15.7 % — ABNORMAL HIGH (ref 11.5–15.5)
WBC: 8.7 10*3/uL (ref 4.0–10.5)
nRBC: 0 % (ref 0.0–0.2)

## 2021-05-15 LAB — AEROBIC CULTURE W GRAM STAIN (SUPERFICIAL SPECIMEN)
Culture: NO GROWTH
Gram Stain: NONE SEEN

## 2021-05-15 MED ORDER — SODIUM CHLORIDE 0.9 % IV SOLN
2.0000 g | Freq: Three times a day (TID) | INTRAVENOUS | Status: DC
  Administered 2021-05-15 – 2021-05-16 (×3): 2 g via INTRAVENOUS
  Filled 2021-05-15 (×3): qty 2

## 2021-05-15 NOTE — Progress Notes (Signed)
[facility] for Infectious Disease    Date of Admission:  04/15/2021   Total days of antibiotics 29   ID: Brenda [name] is a 61 y.o. 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 Tolerated skin biopsies without difficulty   Medications:   acetaminophen  1,000 mg Oral TID   amLODipine  10 mg Oral Daily   buprenorphine-naloxone  1 tablet Sublingual BID   celecoxib  200 mg Oral BID   Chlorhexidine Gluconate Cloth  6 each Topical Daily   cholecalciferol  2,000 Units Oral Daily   feeding supplement  237 mL Oral TID BM   ferrous sulfate  325 mg Oral Q breakfast   hydrochlorothiazide  6.25 mg Oral Daily   hydrOXYzine  50 mg Oral TID   melatonin  3 mg Oral QHS   methocarbamol  500 mg Oral TID   multivitamin with minerals  1 tablet Oral Daily   mupirocin cream   Topical BID   nicotine  14 mg Transdermal Daily   pantoprazole  40 mg Oral BID   pregabalin  50 mg Oral TID   rivaroxaban  10 mg Oral Daily   zolpidem  5 mg Oral QHS    Objective: Vital signs in last 24 hours: Temp:  [98 F (36.7 C)-98.6 F (37 C)] 98.6 F (37 C) (02/02 1240) Pulse Rate:  [59-69] 62 (02/02 1240) Resp:  [17-19] 18 (02/02 1240) BP: (107-132)/(44-52) 118/44 (02/02 1240) SpO2:  [95 %-98 %] 98 % (02/02 0420) Weight:  [59.8 kg] 59.8 kg (02/02 0500) Physical Exam  Constitutional:  oriented to person, place, and time. appears well-developed and well-nourished. No distress.  HENT: [place]/AT, PERRLA, no scleral icterus Mouth/Throat: Oropharynx is clear and moist. No oropharyngeal exudate.  Cardiovascular: Normal rate, regular rhythm and normal heart sounds. Exam reveals no gallop and no friction rub.   No murmur heard.  Pulmonary/Chest: Effort normal and breath sounds normal. No respiratory distress.  has no wheezes.  Neck = supple, no nuchal rigidity Abdominal: Soft. Bowel sounds are normal.  exhibits no distension. There is no tenderness.  Lymphadenopathy: no cervical adenopathy. No axillary adenopathy Neurological: alert and oriented to person, place, and time.  Skin: Skin is warm and dry. No rash noted. No erythema. Scattered nodules on thighs. Sutures on upper right thigh Psychiatric: a normal mood and affect.  behavior is normal.    Lab Results Recent Labs    05/14/21 0500 05/15/21 0422  WBC 11.4* 8.7  HGB 10.9* 10.1*  HCT 34.0* 30.9*  NA 138 136  K 5.3* 4.9  CL 108 106  CO2 23 22  BUN 35* 35*  CREATININE 0.91 0.76    Microbiology: reviewed Studies/Results: No results found.   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 Path and cultures pending.  Anxiety = defer to primary team to manage. She was previously on  klonopin.  [facility] Infectious Diseases Pager: [phone]  05/15/2021, 4:07 PM

## 2021-05-15 NOTE — Progress Notes (Signed)
HD#29 SUBJECTIVE:  Patient Summary: Brenda [name] is a 61 y.o. with a pertinent PMH of cervical spine spondylosis, OUD, HTN, HLD, who is admitted for septic facet joint L2-L3.   Overnight Events: None  Interim History: Patient has no complaints at this time. She is understandably curious and nervous about the TEE and what the next steps would be if this were to be positive for vegetations.  OBJECTIVE:  Vital Signs: Vitals:   05/14/21 2336 05/15/21 0420 05/15/21 0500 05/15/21 0835  BP: (!) 109/52 (!) 107/49  (!) 120/51  Pulse: 69 (!) 59  61  Resp: 17 18  18   Temp: 98.3 F (36.8 C) 98 F (36.7 C)  98.1 F (36.7 C)  TempSrc: Oral Oral  Oral  SpO2: 95% 98%    Weight:   59.8 kg   Height:       Supplemental O2: Room Air SpO2: 98 %  Filed Weights   05/12/21 0500 05/13/21 0500 05/15/21 0500  Weight: 44 kg 47.9 kg 59.8 kg    No intake or output data in the 24 hours ending 05/15/21 1209 Net IO Since Admission: 10,995.21 mL [05/15/21 1209]  Physical Exam: Constitutional: Chronically ill appearing female in no acute distress. Cardio: Regular rate and rhythm. Systolic murmur noted at aorta, pulmonary, tricuspid posts. Pulm: Clear to ausculation bilaterally.  MSK: Negative for extremity edema. Skin: Several draining pustules over bilateral thighs without appearance of new lesions. Skin biopsy sites (2) on R thigh 02/01 with sutures in place and no bleeding, drainage, increased warmth or erythema. Neuro: Alert and oriented x3. No focal deficit noted. Psych: Normal mood and affect.   Patient Lines/Drains/Airways Status     Active Line/Drains/Airways     Name Placement date Placement time Site Days   Peripheral IV 04/21/21 20 G 1" Left;Lateral Forearm 04/21/21  1153  Forearm  24   PICC Single Lumen 04/21/21 Right Brachial 34 cm 0 cm 04/21/21  1710  Brachial  24   Wound / Incision (Open or Dehisced) 05/11/21 Other (Comment) Thigh Anterior;Right Blister like areas that are  opening up and oozing 05/11/21  1330  Thigh  4   Wound / Incision (Open or Dehisced) 05/11/21 Other (Comment) Thigh Anterior;Left Blister like areas that are oozing 05/11/21  1330  Thigh  4             ASSESSMENT/PLAN:  Assessment: Principal Problem:   Septic joint (HCC) Active Problems:   Essential hypertension   Leukocytosis   Opioid use disorder, moderate, dependence (HCC)   Protein-calorie malnutrition, severe   Opiate withdrawal ([place])   Pustules determined by examination   Multiple skin nodules   Plan: #L2-L3 Discitis #Chronic back pain Diagnosis supported by MRI imaging findings.  Patient has previously received empiric IV antibiotics of ceftriaxone 2 g daily and vancomycin 750 mg daily with end date of 05/27/2021. Ceftriaxone was changed to cefepime 01/31. Received one dose of Orbactiv 02/01 as plan at the time was for transition to oral antibiotics and discharge home today. -Infectious disease following, appreciate their assistance. -Given new information regarding delay in ability to obtain TEE thus delaying discharge, will follow-up with ID recommendations regarding continuing current inpatient regimen. -Continue Suboxone 8-2 mg BID -Scheduled tylenol 1000 mg TID, Scheduled Celebrex 200 mg BID. -Schedule Robaxin 500 mg TID; titrate up if needed daily limit 4g/day for increased pain -Continue lyrica 50 mg TID -Avoid other NSAIDs if possible due to her gastritis.   #MRSA Skin Pustules Several draining  pustules persist over the anterior and lateral aspects of bilateral thighs, new to the patient during this admission.  Has not had new pustules arise over last several days and those that have been present appear to be healing. Sites of skin biopsy 02/01 without increased warmth, erythema, drainage, bleeding. -Continue mupirocin 2%  BID -Swab of pustule drainage collected with negative gram stain and culture showing NGTD. -Skin biopsy aerobic/anaerobic cultures, acid fast  smear, and acid fast culture pending   #Opioid use disorder Patient stable on Suboxone since 04/29/21.  Of note patient does have a systolic murmur not previously documented; echocardiogram 01/05 was notable for mitral valve regurgitations well as aortic valve regurgitation. -Continue Suboxone.  Can increase to 3 times daily that half dose on the third dose if needed for worsening pain.   -TEE ordered, next available 02/06   #Underweight BMI 17.84 on admission.  Patient endorses good p.o. intake.   -Nutrition following, appreciate their assistance. -Multivitamin, Ensure Enlive 3 times daily, snacks   #Anxiety Likely multifactorial.  QTc was previously noted to be prolonged, avoid SSRI. -Continue Hydroxyzine 50 mg TID   #Normocytic anemia Hgb stable.  Iron studies collected 01/20 showed iron 56 with 16 percent saturation, ferritin of 28.  Given reported leg cramps, we will set ferritin goal to 70.  -Continue Protonix 40 mg BID -Avoid NSAIDS except celebrex 200 mg BID due to gastritis -Continue iron 325 mg daily -Trend CBC   #Hypertension Patient continues to be normotensive on current regimen.  Given recent hyperkalemia, will discontinue losartan and initiate a thiazide diuretic. -Continue Amlodipine 10 mg daily, HCTZ 6.25 mg daily  Best Practice: Diet: Regular diet IVF: Fluids: IV push only, no IV fluids VTE: rivaroxaban (XARELTO) tablet 10 mg Start: 04/22/21 1000 Code: DNR AB: Cefepime 2 g daily, vancomycin 750 mg twice daily DISPO: Anticipated discharge to Home pending  medical stability.  Signature: Farrel Gordon, D.O.  Internal Medicine Resident, PGY-1 [facility]  Pager: [phone] 12:09 PM, 05/15/2021   Please contact the on call pager after 5 pm and on weekends at [phone].

## 2021-05-15 NOTE — Plan of Care (Signed)

## 2021-05-15 NOTE — Progress Notes (Signed)
PHARMACY NOTE:  ANTIMICROBIAL RENAL DOSAGE ADJUSTMENT  Current antimicrobial regimen includes a mismatch between antimicrobial dosage and estimated renal function.  As per policy approved by the Pharmacy & Therapeutics and Medical Executive Committees, the antimicrobial dosage will be adjusted accordingly.  Current antimicrobial dosage:  Cefepime 2g IV Q12H   Indication: Septic Joint   Renal Function:  Estimated Creatinine Clearance: 65.4 mL/min (by C-G formula based on SCr of 0.76 mg/dL).     Antimicrobial dosage has been changed to:  Cefepime 2g IV Q8H   Additional comments:  Brenda [name], PharmD PGY-1 Acute Care Resident  05/15/2021 1:08 PM

## 2021-05-16 DIAGNOSIS — M4650 Other infective spondylopathies, site unspecified: Secondary | ICD-10-CM | POA: Diagnosis not present

## 2021-05-16 LAB — BASIC METABOLIC PANEL
Anion gap: 4 — ABNORMAL LOW (ref 5–15)
BUN: 38 mg/dL — ABNORMAL HIGH (ref 6–20)
CO2: 23 mmol/L (ref 22–32)
Calcium: 9.5 mg/dL (ref 8.9–10.3)
Chloride: 109 mmol/L (ref 98–111)
Creatinine, Ser: 0.79 mg/dL (ref 0.44–1.00)
GFR, Estimated: >60 mL/min (ref >60)
Glucose, Bld: 105 mg/dL — ABNORMAL HIGH (ref 70–99)
Potassium: 4.7 mmol/L (ref 3.5–5.1)
Sodium: 136 mmol/L (ref 135–145)

## 2021-05-16 LAB — CBC
HCT: 29.7 % — ABNORMAL LOW (ref 36.0–46.0)
Hemoglobin: 9.5 g/dL — ABNORMAL LOW (ref 12.0–15.0)
MCH: 29.1 pg (ref 26.0–34.0)
MCHC: 32 g/dL (ref 30.0–36.0)
MCV: 90.8 fL (ref 80.0–100.0)
Platelets: 219 10*3/uL (ref 150–400)
RBC: 3.27 MIL/uL — ABNORMAL LOW (ref 3.87–5.11)
RDW: 15.6 % — ABNORMAL HIGH (ref 11.5–15.5)
WBC: 8 10*3/uL (ref 4.0–10.5)
nRBC: 0 % (ref 0.0–0.2)

## 2021-05-16 LAB — ACID FAST SMEAR (AFB, MYCOBACTERIA): Acid Fast Smear: NEGATIVE

## 2021-05-16 MED ORDER — ATORVASTATIN CALCIUM 10 MG PO TABS
20.0000 mg | ORAL_TABLET | Freq: Every day | ORAL | Status: DC
  Administered 2021-05-16 – 2021-05-20 (×4): 20 mg via ORAL
  Filled 2021-05-16 (×3): qty 2

## 2021-05-16 MED ORDER — SODIUM CHLORIDE 0.9 % IV SOLN
2.0000 g | Freq: Two times a day (BID) | INTRAVENOUS | Status: DC
  Administered 2021-05-16 – 2021-05-19 (×6): 2 g via INTRAVENOUS
  Filled 2021-05-16 (×6): qty 2

## 2021-05-16 NOTE — H&P (View-Only) (Signed)
° ° °  [facility] has been requested to perform a transesophageal echocardiogram on Brenda [name] for bacteremia.  After careful review of history and examination, the risks and benefits of transesophageal echocardiogram have been explained including risks of esophageal damage, perforation (1:10,000 risk), bleeding, pharyngeal hematoma as well as other potential complications associated with conscious sedation including aspiration, arrhythmia, respiratory failure and death. Alternatives to treatment were discussed, questions were answered. Patient is willing to proceed.   Pt scheduled 05/19/21 at 1300. NPO at [facility] Sunday night please.  Tami Lin Aritha [place], [place]  05/16/2021 3:31 PM

## 2021-05-16 NOTE — Progress Notes (Signed)
PHARMACY NOTE:  ANTIMICROBIAL RENAL DOSAGE ADJUSTMENT  Current antimicrobial regimen includes a mismatch between antimicrobial dosage and estimated renal function.  As per policy approved by the Pharmacy & Therapeutics and Medical Executive Committees, the antimicrobial dosage will be adjusted accordingly.  Current antimicrobial dosage:  Cefepime 2g IV Q8H   Indication: Septic Joint   Renal Function:  Estimated Creatinine Clearance: 58.6 mL/min (by C-G formula based on SCr of 0.79 mg/dL).     Antimicrobial dosage has been changed to:  Cefepime 2g IV Q12H   Additional comments:  Thank you for allowing pharmacy to be a part of this patients care.  Alycia Rossetti, PharmD, BCPS Clinical Pharmacist 05/16/2021 8:53 AM   **Pharmacist phone directory can now be found on [email or web address] ([email or web address]).  Listed under [place].

## 2021-05-16 NOTE — Plan of Care (Signed)

## 2021-05-16 NOTE — Progress Notes (Signed)
HD#30 SUBJECTIVE:  Patient Summary: Brenda [name] is a 61 y.o. with a pertinent PMH of cervical spine spondylosis, OUD, HTN, HLD, who is admitted for septic facet joint L2-L3.   Overnight Events: None  Interim History: Patient has no complaints at this time. Her only concern is getting to hospital ATM to withdraw money to pay rent with today since she is not discharging at this time.  OBJECTIVE:  Vital Signs: Vitals:   05/15/21 1949 05/15/21 2340 05/16/21 0400 05/16/21 0700  BP: (!) 166/64 (!) 113/49 (!) 115/49   Pulse: 72 73 60   Resp: _0 Temp: 98.4 F (36.9 C) 98.1 F (36.7 C) 98.2 F (36.8 C)   TempSrc: Oral Oral Oral   SpO2: 97% 96% 97%   Weight:    49 kg  Height:       Supplemental O2: Room Air SpO2: 97 %  Filed Weights   05/13/21 0500 05/15/21 0500 05/16/21 0700  Weight: 47.9 kg 59.8 kg 49 kg     Intake/Output Summary (Last 24 hours) at 05/16/2021 0853 Last data filed at 05/16/2021 0544 Gross per 24 hour  Intake 797 ml  Output --  Net 797 ml   Net IO Since Admission: 11,792.21 mL [05/16/21 0853]  Physical Exam: Constitutional: Chronically ill appearing female in no acute distress. Cardio: Regular rate and rhythm. Systolic, "whooshing" murmur auscultated at four posts. Pulm: Clear to auscultation bilaterally. Normal work of breathing on room air. MSK: Several draining but healing pustules over bilateral thighs without growth of new lesions. Skin biopsy sites over R anterior thigh with intact sutures, some bleeding but no increased warmth or erythema. Skin: Warm and dry. Neuro: Alert and oriented x3. No focal deficit noted. Psych: Normal mood and affect.  Patient Lines/Drains/Airways Status     Active Line/Drains/Airways     Name Placement date Placement time Site Days   Peripheral IV 04/21/21 20 G 1" Left;Lateral Forearm 04/21/21  1153  Forearm  25   PICC Single Lumen 04/21/21 Right Brachial 34 cm 0 cm 04/21/21  1710  Brachial  25   Wound /  Incision (Open or Dehisced) 05/11/21 Other (Comment) Thigh Anterior;Right Blister like areas that are opening up and oozing 05/11/21  1330  Thigh  5   Wound / Incision (Open or Dehisced) 05/11/21 Other (Comment) Thigh Anterior;Left Blister like areas that are oozing 05/11/21  1330  Thigh  5             ASSESSMENT/PLAN:  Assessment: Principal Problem:   Septic joint (HCC) Active Problems:   Essential hypertension   Leukocytosis   Opioid use disorder, moderate, dependence (HCC)   Protein-calorie malnutrition, severe   Opiate withdrawal ([place])   Pustules determined by examination   Multiple skin nodules   Plan: #L2-L3 Discitis #Chronic back pain Diagnosis supported by MRI imaging findings.  Patient has previously received empiric IV antibiotics of ceftriaxone 2 g daily and vancomycin 750 mg daily with end date of 05/27/2021. Ceftriaxone was changed to cefepime 01/31. Received one dose of Orbactiv 02/01. -Infectious disease following, appreciate their assistance. -Continue cefepime 2 g BID -Continue Suboxone 8-2 mg BID -Scheduled tylenol 1000 mg TID, Scheduled Celebrex 200 mg BID. -Schedule Robaxin 500 mg TID; titrate up if needed daily limit 4g/day for increased pain -Continue lyrica 50 mg TID -Avoid other NSAIDs if possible due to her gastritis.   #MRSA Skin Pustules Several draining pustules persist over the anterior and lateral aspects of bilateral thighs,  new to the patient during this admission.  Has not had new pustules arise over last several days and those that have been present appear to be healing.  -Continue mupirocin 2%  BID -Swab of pustule drainage collected with negative gram stain and culture showing NGTD. -Skin biopsy aerobic/anaerobic cultures show NGTD; acid fast smear, and acid fast culture pending   #Opioid use disorder Patient stable on Suboxone since 04/29/21.  Of note patient does have a systolic murmur not previously documented; echocardiogram 01/05 was  notable for mitral valve regurgitations well as aortic valve regurgitation. -Continue Suboxone.  Can increase to 3 times daily that half dose on the third dose if needed for worsening pain.   -TEE 02/06  #Hyperlipidemia Chart review revealed most recent lipid panel 11/20/2020 with elevated LDL to 121. Calculated ASCVD of 7.9% with indication for moderate to high-intensity statin. -Will initiate Atorvastatin 20 mg daily    #Underweight BMI 17.84 on admission.  Patient endorses good p.o. intake.   -Nutrition following, appreciate their assistance. -Multivitamin, Ensure Enlive 3 times daily, snacks   #Anxiety Likely multifactorial.  QTc was previously noted to be prolonged, avoid SSRI. -Continue Hydroxyzine 50 mg TID   #Normocytic anemia Hgb stable.  Iron studies collected 01/20 showed iron 56 with 16 percent saturation, ferritin of 28.  Given reported leg cramps, we will set ferritin goal to 70.  -Continue Protonix 40 mg BID -Avoid NSAIDS except celebrex 200 mg BID due to gastritis -Continue iron 325 mg daily -Trend CBC   #Hypertension Patient continues to be normotensive on current regimen.  Given recent hyperkalemia, will discontinue losartan and initiate a thiazide diuretic. -Continue Amlodipine 10 mg daily, HCTZ 6.25 mg daily   Best Practice: Diet: Regular diet IVF: Fluids: IV push only, no IV fluids VTE: rivaroxaban (XARELTO) tablet 10 mg Start: 04/22/21 1000 Code: DNR AB: Cefepime 2 g daily, vancomycin 750 mg twice daily DISPO: Anticipated discharge to Home pending  medical stability.  Signature: Farrel Gordon, D.O.  Internal Medicine Resident, PGY-1 [facility]  Pager: [phone] 8:53 AM, 05/16/2021   Please contact the on call pager after 5 pm and on weekends at [phone].

## 2021-05-16 NOTE — Progress Notes (Signed)
° ° °  [facility] has been requested to perform a transesophageal echocardiogram on Brenda [name] for bacteremia.  After careful review of history and examination, the risks and benefits of transesophageal echocardiogram have been explained including risks of esophageal damage, perforation (1:10,000 risk), bleeding, pharyngeal hematoma as well as other potential complications associated with conscious sedation including aspiration, arrhythmia, respiratory failure and death. Alternatives to treatment were discussed, questions were answered. Patient is willing to proceed.   Pt scheduled 05/19/21 at 1300. NPO at [facility] Sunday night please.  Tami Lin Demetria Lightsey, [place]  05/16/2021 3:31 PM

## 2021-05-16 NOTE — Progress Notes (Signed)
[facility] for Infectious Disease    Date of Admission:  04/15/2021   Total days of antibiotics since 1/4- day 31           ID: Brenda [name] is a 61 y.o. female with  lumbar discitis -initially treated as culture negative Principal Problem:   Septic joint ([place]) Active Problems:   Essential hypertension   Leukocytosis   Opioid use disorder, moderate, dependence (HCC)   Protein-calorie malnutrition, severe   Opiate withdrawal ([place])   Pustules determined by examination   Multiple skin nodules    Subjective: Concern about paying her rent but realizes she has to stay for TEE. Denies fever, chills, nightsweats. Still has some back pain  Medications:   acetaminophen  1,000 mg Oral TID   amLODipine  10 mg Oral Daily   atorvastatin  20 mg Oral Daily   buprenorphine-naloxone  1 tablet Sublingual BID   celecoxib  200 mg Oral BID   Chlorhexidine Gluconate Cloth  6 each Topical Daily   cholecalciferol  2,000 Units Oral Daily   feeding supplement  237 mL Oral TID BM   ferrous sulfate  325 mg Oral Q breakfast   hydrochlorothiazide  6.25 mg Oral Daily   hydrOXYzine  50 mg Oral TID   melatonin  3 mg Oral QHS   methocarbamol  500 mg Oral TID   multivitamin with minerals  1 tablet Oral Daily   mupirocin cream   Topical BID   nicotine  14 mg Transdermal Daily   pantoprazole  40 mg Oral BID   pregabalin  50 mg Oral TID   rivaroxaban  10 mg Oral Daily   zolpidem  5 mg Oral QHS    Objective: Vital signs in last 24 hours: Temp:  [98.1 F (36.7 C)-98.4 F (36.9 C)] 98.2 F (36.8 C) (02/03 0400) Pulse Rate:  [60-73] 60 (02/03 0400) Resp:  [14-18] 14 (02/03 0400) BP: (108-166)/(47-64) 115/49 (02/03 0400) SpO2:  [96 %-97 %] 97 % (02/03 0400) Weight:  [49 kg] 49 kg (02/03 0700) Physical Exam  Constitutional:  oriented to person, place, and time. appears well-developed and well-nourished. No distress.  HENT: [place]/AT, PERRLA, no scleral icterus Mouth/Throat: Oropharynx is clear  and moist. No oropharyngeal exudate.  Cardiovascular: Normal rate, regular rhythm and normal heart sounds. Exam reveals no gallop and no friction rub.  No murmur heard.  Pulmonary/Chest: Effort normal and breath sounds normal. No respiratory distress.  has no wheezes.  Neck = supple, no nuchal rigidity Abdominal: Soft. Bowel sounds are normal.  exhibits no distension. There is no tenderness.  Lymphadenopathy: no cervical adenopathy. No axillary adenopathy Neurological: alert and oriented to person, place, and time.  Skin: Skin is warm and dry. Scattered large papules with erythematous base some ulcerated lesions to bilateral thighs  Psychiatric: a normal mood and affect.  behavior is normal.    Lab Results Recent Labs    05/15/21 0422 05/16/21 0444  WBC 8.7 8.0  HGB 10.1* 9.5*  HCT 30.9* 29.7*  NA 136 136  K 4.9 4.7  CL 106 109  CO2 22 23  BUN 35* 38*  CREATININE 0.76 0.79   Liver Panel No results for input(s): PROT, ALBUMIN, AST, ALT, ALKPHOS, BILITOT, BILIDIR, IBILI in the last 72 hours. Sedimentation Rate No results for input(s): ESRSEDRATE in the last 72 hours. C-Reactive Protein No results for input(s): CRP in the last 72 hours.  Microbiology: reviewed Studies/Results: No results found.   Assessment/Plan: Culture negative  discitis = had a dose of oritavancin on 2/1. Continue on cefepime.  Skin nodules= underwent biopsy. Path still pending. Aerobic cx negative. Will follow up on AFB cultures  Back pain = defer to primary team. Currently on suboxone  Systolic murmur = has TEE planned for Monday to evaluate for endocarditis  We will see back on [facility] for Infectious Diseases Pager: [phone]  05/16/2021, 3:58 PM

## 2021-05-17 LAB — BASIC METABOLIC PANEL
Anion gap: 6 (ref 5–15)
BUN: 37 mg/dL — ABNORMAL HIGH (ref 6–20)
CO2: 24 mmol/L (ref 22–32)
Calcium: 9.9 mg/dL (ref 8.9–10.3)
Chloride: 109 mmol/L (ref 98–111)
Creatinine, Ser: 0.9 mg/dL (ref 0.44–1.00)
GFR, Estimated: >60 mL/min (ref >60)
Glucose, Bld: 99 mg/dL (ref 70–99)
Potassium: 5.2 mmol/L — ABNORMAL HIGH (ref 3.5–5.1)
Sodium: 139 mmol/L (ref 135–145)

## 2021-05-17 LAB — CBC
HCT: 30.1 % — ABNORMAL LOW (ref 36.0–46.0)
Hemoglobin: 9.6 g/dL — ABNORMAL LOW (ref 12.0–15.0)
MCH: 29 pg (ref 26.0–34.0)
MCHC: 31.9 g/dL (ref 30.0–36.0)
MCV: 90.9 fL (ref 80.0–100.0)
Platelets: 221 10*3/uL (ref 150–400)
RBC: 3.31 MIL/uL — ABNORMAL LOW (ref 3.87–5.11)
RDW: 15.8 % — ABNORMAL HIGH (ref 11.5–15.5)
WBC: 8.6 10*3/uL (ref 4.0–10.5)
nRBC: 0 % (ref 0.0–0.2)

## 2021-05-17 NOTE — Progress Notes (Signed)
HD#31 SUBJECTIVE:  Patient Summary: Brenda [name] is a 61 y.o. with a pertinent PMH of cervical spine spondylosis, OUD, HTN, HLD, who is admitted for septic facet joint L2-L3.   Overnight Events: None  Interim History: Patient reports no changes. She has continued frustration over being hospitalized for so long as she has external stressors but is willing to stay patient for a few more days until her TEE Monday.  OBJECTIVE:  Vital Signs: Vitals:   05/16/21 1938 05/16/21 2311 05/17/21 0447 05/17/21 0805  BP: (!) 123/59 105/60 128/62 (!) 148/67  Pulse: 68 70 74 68  Resp: 14 16 16 17   Temp: 98.3 F (36.8 C) 98.6 F (37 C) 98.6 F (37 C) 98.6 F (37 C)  TempSrc: Oral Oral Oral Oral  SpO2: 96% 97% 96%   Weight:   50 kg   Height:       Supplemental O2: Room Air SpO2: 96 %  Filed Weights   05/15/21 0500 05/16/21 0700 05/17/21 0447  Weight: 59.8 kg 49 kg 50 kg     Intake/Output Summary (Last 24 hours) at 05/17/2021 0817 Last data filed at 05/16/2021 1900 Gross per 24 hour  Intake 100 ml  Output --  Net 100 ml   Net IO Since Admission: 11,892.21 mL [05/17/21 0817]  Physical Exam: Constitutional: Chronically ill-appearing female in no acute distress. Cardio: Regular rate and rhythm.  Systolic, "whooshing" murmur auscultated at 4 post. Pulm: Normal work of breathing on room air. Abdomen: Soft, nondistended, nontender. MSK: Negative for extremity edema. Skin: Several healing pustules over bilateral anterior thighs without progression of new lesions.  2 skin biopsy sites on right thigh with intact sutures, no increased warmth or erythema. Neuro: Alert and oriented x3.  No focal deficit noted. Psych: Normal mood and affect.  Patient Lines/Drains/Airways Status     Active Line/Drains/Airways     Name Placement date Placement time Site Days   Peripheral IV 04/21/21 20 G 1" Left;Lateral Forearm 04/21/21  1153  Forearm  26   PICC Single Lumen 04/21/21 Right Brachial 34 cm 0  cm 04/21/21  1710  Brachial  26   Wound / Incision (Open or Dehisced) 05/11/21 Other (Comment) Thigh Anterior;Right Blister like areas that are opening up and oozing 05/11/21  1330  Thigh  6   Wound / Incision (Open or Dehisced) 05/11/21 Other (Comment) Thigh Anterior;Left Blister like areas that are oozing 05/11/21  1330  Thigh  6             ASSESSMENT/PLAN:  Assessment: Principal Problem:   Septic joint (HCC) Active Problems:   Essential hypertension   Leukocytosis   Opioid use disorder, moderate, dependence (HCC)   Protein-calorie malnutrition, severe   Opiate withdrawal ([place])   Pustules determined by examination   Multiple skin nodules   Plan: #L2-L3 Discitis #Chronic back pain Diagnosis supported by MRI imaging findings.  Transitioned from ceftriaxone to cefepime 01/31, vancomycin discontinued 01/31. Received one dose of Orbactiv 02/01.  -Infectious disease following, appreciate their assistance. -Continue cefepime 2 g BID -Continue Suboxone 8-2 mg BID -Scheduled tylenol 1000 mg TID, Scheduled Celebrex 200 mg BID. -Schedule Robaxin 500 mg TID; titrate up if needed daily limit 4g/day for increased pain -Continue lyrica 50 mg TID -Avoid other NSAIDs if possible due to her gastritis   #MRSA Skin Pustules Several draining pustules persist over the anterior and lateral aspects of bilateral thighs, new to the patient during this admission.  Has not had new pustules arise  over last several days and those that have been present appear to be healing.  -Continue mupirocin 2%  BID -Swab of pustule drainage collected with negative gram stain and culture showing NGTD. -Skin biopsy aerobic/anaerobic cultures show NGTD; acid fast smear neagtive, and acid fast culture pending   #Opioid use disorder Patient stable on Suboxone since 04/29/21.  Of note patient does have a systolic murmur not previously documented; echocardiogram 01/05 was notable for mitral valve regurgitations well as  aortic valve regurgitation. -Continue Suboxone.  Can increase to 3 times daily that half dose on the third dose if needed for worsening pain.   -TEE 02/06   #Hyperlipidemia Chart review revealed most recent lipid panel 11/20/2020 with elevated LDL to 121. Calculated ASCVD of 7.9% with indication for moderate to high-intensity statin. -Continue Atorvastatin 20 mg daily    #Underweight BMI 17.84 on admission.  Patient endorses good p.o. intake.   -Nutrition following, appreciate their assistance. -Multivitamin, Ensure Enlive 3 times daily, snacks   #Anxiety Likely multifactorial.  QTc was previously noted to be prolonged, avoid SSRI. -Continue Hydroxyzine 50 mg TID   #Normocytic anemia Hgb stable.  Iron studies collected 01/20 showed iron 56 with 16 percent saturation, ferritin of 28.  Given reported leg cramps, we will set ferritin goal to 70.  -Continue Protonix 40 mg BID -Avoid NSAIDS except celebrex 200 mg BID due to gastritis -Continue iron 325 mg daily -Trend CBC   #Hypertension Patient continues to be normotensive on current regimen.  Given recent hyperkalemia, will discontinue losartan and initiate a thiazide diuretic. -Continue Amlodipine 10 mg daily, HCTZ 6.25 mg daily  Best Practice: Diet: Regular diet IVF: Fluids: IV push only, no IV fluids VTE: rivaroxaban (XARELTO) tablet 10 mg Start: 04/22/21 1000 Code: DNR AB: Cefepime 2 g daily, vancomycin 750 mg twice daily DISPO: Anticipated discharge to Home pending  medical stability.  Signature: Farrel Gordon, D.O.  Internal Medicine Resident, PGY-1 [facility]  Pager: [phone] 8:17 AM, 05/17/2021   Please contact the on call pager after 5 pm and on weekends at [phone].

## 2021-05-18 DIAGNOSIS — M4656 Other infective spondylopathies, lumbar region: Secondary | ICD-10-CM

## 2021-05-18 LAB — CBC
HCT: 31.9 % — ABNORMAL LOW (ref 36.0–46.0)
Hemoglobin: 10.4 g/dL — ABNORMAL LOW (ref 12.0–15.0)
MCH: 29.7 pg (ref 26.0–34.0)
MCHC: 32.6 g/dL (ref 30.0–36.0)
MCV: 91.1 fL (ref 80.0–100.0)
Platelets: 247 10*3/uL (ref 150–400)
RBC: 3.5 MIL/uL — ABNORMAL LOW (ref 3.87–5.11)
RDW: 15.9 % — ABNORMAL HIGH (ref 11.5–15.5)
WBC: 10.7 10*3/uL — ABNORMAL HIGH (ref 4.0–10.5)
nRBC: 0 % (ref 0.0–0.2)

## 2021-05-18 LAB — BASIC METABOLIC PANEL
Anion gap: 7 (ref 5–15)
BUN: 33 mg/dL — ABNORMAL HIGH (ref 6–20)
CO2: 22 mmol/L (ref 22–32)
Calcium: 9.8 mg/dL (ref 8.9–10.3)
Chloride: 109 mmol/L (ref 98–111)
Creatinine, Ser: 0.71 mg/dL (ref 0.44–1.00)
GFR, Estimated: >60 mL/min (ref >60)
Glucose, Bld: 106 mg/dL — ABNORMAL HIGH (ref 70–99)
Potassium: 4.5 mmol/L (ref 3.5–5.1)
Sodium: 138 mmol/L (ref 135–145)

## 2021-05-18 MED ORDER — BUPRENORPHINE HCL-NALOXONE HCL 8-2 MG SL SUBL
0.5000 | SUBLINGUAL_TABLET | Freq: Once | SUBLINGUAL | Status: AC
  Administered 2021-05-18: 0.5 via SUBLINGUAL
  Filled 2021-05-18: qty 1

## 2021-05-18 NOTE — Progress Notes (Signed)
Subjective: No acute overnight events.  Patient reports that she has been having tremors of her bilateral upper and lower extremities.  Upon further questioning she states it is more like a jerking sensation which is completely sporadic in nature.  She feels that she also bit her lip in her sleep.  She states that one of the wounds on her legs has been bleeding and became soft.  She feels that the ointment is helping.  She is also endorses diarrhea, reports about 5 soft stools for the past few days.  Lastly, she states that her Suboxone dose does not feel like it is enough.  She feels that it is not lasting long enough and that previously she was on 3 times daily dosing.  She worries when she leaves the hospital twice daily dosing would not be enough for her cravings.  Objective:  Vital signs in last 24 hours: Vitals:   05/17/21 1243 05/17/21 1633 05/17/21 2000 05/18/21 0131  BP: 110/62 (!) 112/59 113/67 (!) 118/56  Pulse: 68 65  70  Resp: 16 17 17 18   Temp: 98.4 F (36.9 C) 98.1 F (36.7 C) 98.2 F (36.8 C) 98 F (36.7 C)  TempSrc: Oral Oral Oral Oral  SpO2:  95% 95% 96%  Weight:      Height:       Physical Exam General: alert, appears stated age, in no acute distress HEENT: Normocephalic, atraumatic, EOM intact, conjunctiva normal CV: Regular rate and rhythm, no murmurs rubs or gallops Pulm: Clear to auscultation bilaterally, normal work of breathing Abdomen: Soft, nondistended, bowel sounds present, no tenderness to palpation MSK: No lower extremity edema Skin: Warm and dry, multiple draining pustules along anterior and lateral thighs with notable serous drainage of one of the pustules on the left thigh Neuro: Alert and oriented x3, bilateral upper and lower extremity strength 5 out of 5, sensation intact, no tremor noted  Assessment/Plan:  Principal Problem:   Septic joint (HCC) Active Problems:   Essential hypertension   Leukocytosis   Opioid use disorder, moderate,  dependence (HCC)   Protein-calorie malnutrition, severe   Opiate withdrawal (HCC)   Pustules determined by examination   Multiple skin nodules   #L2-L3 Discitis #Chronic back pain Diagnosis supported by MRI imaging findings.  Transitioned from ceftriaxone to cefepime 01/31, vancomycin discontinued 01/31. Received one dose of Orbactiv 02/01.  -Infectious disease following, appreciate recommendations -Continue cefepime 2 g twice daily -Continue Suboxone 8-2 mg twice daily and add 1/2 dose in the afternoon -Scheduled tylenol 1000 mg TID, Scheduled Celebrex 200 mg BID. -Scheduled Robaxin 500 mg TID; titrate up if needed daily limit 4g/day for increased pain -Continue lyrica 50 mg TID -Avoid other NSAIDs if possible due to her gastritis   #MRSA Skin Pustules Several draining pustules persist over the anterior and lateral aspects of bilateral thighs, new to the patient during this admission.  Has not had new pustules arise over last several days and those that have been present appear to be healing.  -Continue mupirocin 2%  BID -Swab of pustule drainage collected with negative gram stain and culture showing NGTD. -Skin biopsy aerobic/anaerobic cultures show NGTD; acid fast smear neagtive, and acid fast culture pending   #Opioid use disorder Patient stable on Suboxone since 04/29/21.  Of note patient does have a systolic murmur not previously documented; echocardiogram 01/05 was notable for mitral valve regurgitations well as aortic valve regurgitation. -Continue Suboxone, increased to half dose in the afternoon.  Can increase to 3  times daily dosing if needed -TEE 02/06  #Tremor/jerking sensation Patient reports bilateral upper and lower extremity tremors and jerking sensations which are sporadic in nature.  Not noted on examination.  Neurological exam was unremarkable with bilateral upper and lower extremity strength 5 out of 5. Will continue to monitor.   #Hyperlipidemia -Continue  Atorvastatin 20 mg daily    #Underweight -Nutrition following, appreciate their assistance. -Multivitamin, Ensure Enlive 3 times daily, snacks   #Anxiety -Continue Hydroxyzine 50 mg TID   #Normocytic anemia Hgb stable.  Iron studies collected 01/20 showed iron 56 with 16 percent saturation, ferritin of 28.  Given reported leg cramps, we will set ferritin goal to 70.  -Continue Protonix 40 mg BID -Avoid NSAIDS except celebrex 200 mg BID due to gastritis -Continue iron 325 mg daily -Trend CBC   #Hypertension Patient continues to be normotensive on current regimen.  Given recent hyperkalemia, will discontinue losartan and initiate a thiazide diuretic. -Continue Amlodipine 10 mg daily, HCTZ 6.25 mg daily   Best Practice: Diet: Regular diet IVF: Fluids: IV push only, no IV fluids VTE: rivaroxaban (XARELTO) tablet 10 mg Start: 04/22/21 1000 Code: DNR AB: Cefepime 2 g daily, vancomycin 750 mg twice daily DISPO: Anticipated discharge to Home pending  medical stability.  Brenda Kita N, DO 05/18/2021, 7:40 AM After 5pm on weekdays and 1pm on weekends: On Call pager [phone]

## 2021-05-19 ENCOUNTER — Inpatient Hospital Stay (HOSPITAL_COMMUNITY): Payer: Medicaid Other | Admitting: Certified Registered Nurse Anesthetist

## 2021-05-19 ENCOUNTER — Encounter (HOSPITAL_COMMUNITY)
Admission: EM | Disposition: A | Discharge status: Home / Self Care | Payer: Self-pay | Source: Home / Self Care | Attending: Internal Medicine

## 2021-05-19 ENCOUNTER — Encounter (HOSPITAL_COMMUNITY): Payer: Self-pay | Admitting: Student in an Organized Health Care Education/Training Program

## 2021-05-19 ENCOUNTER — Inpatient Hospital Stay (HOSPITAL_COMMUNITY): Payer: Medicaid Other

## 2021-05-19 DIAGNOSIS — I340 Nonrheumatic mitral (valve) insufficiency: Secondary | ICD-10-CM

## 2021-05-19 DIAGNOSIS — M4650 Other infective spondylopathies, site unspecified: Secondary | ICD-10-CM | POA: Diagnosis not present

## 2021-05-19 DIAGNOSIS — I351 Nonrheumatic aortic (valve) insufficiency: Secondary | ICD-10-CM

## 2021-05-19 DIAGNOSIS — I330 Acute and subacute infective endocarditis: Secondary | ICD-10-CM

## 2021-05-19 DIAGNOSIS — R7881 Bacteremia: Secondary | ICD-10-CM

## 2021-05-19 HISTORY — PX: TEE WITHOUT CARDIOVERSION: SHX5443

## 2021-05-19 SURGERY — ECHOCARDIOGRAM, TRANSESOPHAGEAL
Anesthesia: Monitor Anesthesia Care

## 2021-05-19 MED ORDER — PROPOFOL 500 MG/50ML IV EMUL
INTRAVENOUS | Status: DC | PRN
  Administered 2021-05-19: 125 ug/kg/min via INTRAVENOUS

## 2021-05-19 MED ORDER — SODIUM CHLORIDE 0.9 % IV SOLN: INTRAVENOUS | Status: DC

## 2021-05-19 MED ORDER — SODIUM CHLORIDE 0.9 % IV SOLN
2.0000 g | Freq: Three times a day (TID) | INTRAVENOUS | Status: DC
  Administered 2021-05-19 – 2021-05-20 (×3): 2 g via INTRAVENOUS
  Filled 2021-05-19 (×6): qty 2

## 2021-05-19 MED ORDER — PROPOFOL 10 MG/ML IV BOLUS
INTRAVENOUS | Status: DC | PRN
  Administered 2021-05-19: 20 mg via INTRAVENOUS

## 2021-05-19 MED ORDER — SODIUM CHLORIDE 0.9 % IV SOLN
INTRAVENOUS | Status: AC | PRN
  Administered 2021-05-19: 500 mL via INTRAMUSCULAR

## 2021-05-19 MED ORDER — LOPERAMIDE HCL 2 MG PO CAPS
4.0000 mg | ORAL_CAPSULE | Freq: Once | ORAL | Status: AC
  Administered 2021-05-19: 4 mg via ORAL
  Filled 2021-05-19: qty 2

## 2021-05-19 NOTE — Progress Notes (Signed)
PHARMACY NOTE:  ANTIMICROBIAL RENAL DOSAGE ADJUSTMENT  Current antimicrobial regimen includes a mismatch between antimicrobial dosage and estimated renal function.  As per policy approved by the Pharmacy & Therapeutics and Medical Executive Committees, the antimicrobial dosage will be adjusted accordingly.  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  Antimicrobial dosage has been changed to:  Cefepime 2 g IV Q8H   Additional comments: Due to patient's low bodyweight, small changes in Scr will cause fluctuations in CrCl. As patient is now being treated for endocarditis and hovers around 60 mL/min CrCl, will dose adjust cefepime to 2g IV Q8H.    Thank you for allowing pharmacy to be a part of this patient's care.  Adria Dill, PharmD PGY-1 Acute Care Resident  05/19/2021 1:38 PM

## 2021-05-19 NOTE — Discharge Summary (Signed)
Name: Brenda [name] MRN: [id number] DOB: 03/07/1961 61 y.o. PCP: Pcp, No  Date of Admission: 04/15/2021  5:20 PM Date of Discharge:  05/19/2021 Attending Physician: Dr.  Cain Sieve  DISCHARGE DIAGNOSIS:  Primary Problem: Septic joint [facility])   Hospital Problems: Principal Problem:   Septic joint ([place]) Active Problems:   Essential hypertension   Leukocytosis   Opioid use disorder, moderate, dependence (HCC)   Protein-calorie malnutrition, severe   Opiate withdrawal ([place])   Pustules determined by examination   Multiple skin nodules   Nonrheumatic aortic valve insufficiency   Acute bacterial endocarditis    DISCHARGE MEDICATIONS:   Allergies as of 05/20/2021   No Known Allergies      Medication List     STOP taking these medications    hydrALAZINE 50 MG tablet Commonly known as: APRESOLINE   HYDROcodone-acetaminophen 10-325 MG tablet Commonly known as: NORCO   labetalol 100 MG tablet Commonly known as: NORMODYNE   spironolactone 25 MG tablet Commonly known as: ALDACTONE       TAKE these medications    amLODipine 10 MG tablet Commonly known as: NORVASC Take 1 tablet (10 mg total) by mouth daily.   aspirin EC 81 MG tablet Take 1 tablet (81 mg total) by mouth daily.   atorvastatin 20 MG tablet Commonly known as: LIPITOR Take 1 tablet (20 mg total) by mouth daily. Start taking on: May 21, 2021   buprenorphine-naloxone 8-2 mg Subl SL tablet Commonly known as: SUBOXONE Place 1 tablet under the tongue 2 (two) times daily for 14 days.   celecoxib 200 MG capsule Commonly known as: CELEBREX Take 1 capsule (200 mg total) by mouth 2 (two) times daily.   doxycycline 50 MG capsule Commonly known as: VIBRAMYCIN Take 2 capsules (100 mg total) by mouth 2 (two) times daily for 14 days.   feeding supplement Liqd Take 237 mLs by mouth 3 (three) times daily between meals.   ferrous sulfate 325 (65 FE) MG tablet Take 1 tablet (325 mg total) by mouth daily with  breakfast. Start taking on: May 21, 2021   hydrochlorothiazide 12.5 MG tablet Commonly known as: HYDRODIURIL Take 0.5 tablets (6.25 mg total) by mouth daily. Start taking on: May 21, 2021   hydrOXYzine 50 MG tablet Commonly known as: ATARAX Take 1 tablet (50 mg total) by mouth 3 (three) times daily.   levofloxacin 750 MG tablet Commonly known as: Levaquin Take 1 tablet (750 mg total) by mouth daily for 14 days.   methocarbamol 500 MG tablet Commonly known as: ROBAXIN Take 1 tablet (500 mg total) by mouth 3 (three) times daily.   multivitamin with minerals Tabs tablet Take 1 tablet by mouth daily.   pregabalin 50 MG capsule Commonly known as: LYRICA Take 1 capsule (50 mg total) by mouth 3 (three) times daily.   Vitamin D3 25 MCG tablet Commonly known as: Vitamin D Take 2 tablets (2,000 Units total) by mouth daily. Start taking on: May 21, 2021               Discharge Care Instructions  (From admission, onward)           Start     Ordered   05/20/21 0000  Discharge wound care:       Comments: Keep healing areas warm and dry. You do not need to apply antibiotic cream daily.   05/20/21 1134            DISPOSITION AND FOLLOW-UP:  [name]  was discharged from [facility] in Stable condition. At the hospital follow up visit please address:  Septic L2-L3 facet joint Chronic back pain MRSA skin pustules Mitral valve endocarditis Ensure appropriate complete of antibiotic course.   Hyperlipidemia Recheck lipid panel (last done 11/2020).  Hypertension Ensure appropriate management of HTN with new regimen.   Myalgias Recheck vitamin D levels.  Follow-up Recommendations: Consults: Infectious disease Labs: CBC and Comprehensive Metabolic Panel, vitamin D level, lipid panel Studies: None Medications: Antibiotics: levofloxacin 750 mg daily and doxycycline 100 mg twice daily for 14 days; new statin: atorvastatin 20 mg  daily; new hypertension management of amlodipine and HCTZ; vitamin D and iron supplementation.  Follow-up Appointments:  Follow-up Information     [facility]              Follow up.   Contact information: [address] [place] [address]        [facility]   Contact information: [place] [address] [phone]                 HOSPITAL COURSE:  Patient Summary: Septic L2-L3 facet joint Chronic back pain MRSA skin pustules Mitral valve endocarditis Patient arrived to [facility] with acute on chronic back pain. Work-up notable for mild fever 100.4, mild leukocytosis, and elevated inflammatory markers. Imaging in the ED revealed irregularity of the left L2-L3 facet joint. IR was consulted for aspiration, but was unable to perform this given location and size of the joint. Discussed with infectious disease, who have recommended six weeks of IV antibiotic therapy with ceftriaxone and vancomycin. For the first few weeks of hospitalization, patient's pain was significant while attempting to transition of IV opioid medications. Thankfully, patient settled on a safe pain regimen that worked well through the rest of her hospitalization. During hospitalization, patient also developed pustules in bilateral lower extremities. These were treated with topical antibiotic. Fluid culture collected from these sites showed a negative gram stain and culture. A skin biopsy was obtained and showed culture with rare WBC but no organisms seen, no anaerobes isolated on culture; acid fast smear negative; acid fast culture had not yet resulted. A new murmur was auscultated 02/01 prompting a TEE 02/06 which showed mitral valve endocarditis, moderate central aortic regurgitation. She was able to be transitioned to oral antibiotics at discharge. Patient to follow-up with the infectious disease clinic two weeks after  discharge.    Opioid use disorder On arrival patient reported significant polysubstance use. Patient did use IV opioid a few hours prior to arrival. On admission patient was beginning to experience some withdrawal symptoms, including nausea, vomiting, and diarrhea. She was previously on methadone, which worked well for her. Given acute infection, pain was adequately controlled with IV dilaudid every few hours. We also initiated multimodal approach to treat pain in order to de-escalate off of opioid medications. After two weeks inpatient, we successfully transitioned Ms. Johanson over to [employer] 8-2mg  twice daily. Initially required IV toradol to help augment pain control during the transition, but was able to transition to full oral pain medications soon after this. At discharge, patient on Suboxone 8-2 mg BID.  Hyperlipidemia Patient's ASCVD risk factor calculated to be 7.9% while inpatient. She was started on atorvastatin 20 mg daily.  Hypertension Initially on arrival patient's blood pressure significantly elevated with systolics in 191-478. Slowly up-titrated oral medications for adequate control. Patient was normotensive on the following  regimen: amlodipine 10mg  daily, HCTZ 6.25 daily. Patient will need continued follow-up with primary care office at discharge.   Myalgias During hospitalization, patient mentioned that she has been experiencing myalgias, especially while at rest. No evidence of peripheral vascular disease. Metabolic studies revealed deficiency in vitamin D, which could be contributing. In addition, patient has a history of cocaine use, which could be myotoxic. Patient to be discharge on oral iron and vitamin D supplementation. Will need follow-up with primary care office at discharge.   Normocytic anemia Iron studies 01/20 showed iron 56 with 16 percent saturation, ferritin of 28. She was started on iron 325 mg daily.   DISCHARGE INSTRUCTIONS:   Discharge Instructions      Call MD for:  redness, tenderness, or signs of infection (pain, swelling, redness, odor or green/yellow discharge around incision site)   Complete by: As directed    Call MD for:  severe uncontrolled pain   Complete by: As directed    Call MD for:  temperature >100.4   Complete by: As directed    Diet - low sodium heart healthy   Complete by: As directed    Discharge wound care:   Complete by: As directed    Keep healing areas warm and dry. You do not need to apply antibiotic cream daily.   Increase activity slowly   Complete by: As directed        SUBJECTIVE:  Patient evaluated at bedside on day of discharge. She is eager to discharge and plans to connect with community resources for Suboxone and Narcotics Anonymous.   Discharge Vitals:   BP (!) 126/59 (BP Location: Left Arm)    Pulse 63    Temp 98.1 F (36.7 C) (Oral)    Resp 16    Ht 5\' 4"  (1.626 m)    Wt 50 kg    LMP 09/23/2007    SpO2 96%    BMI 18.92 kg/m   OBJECTIVE:  Constitutional: Chronically ill appearing female, no acute distress. Cardio: Regular rate and rhythm. Systolic murmur noted over 4 posts. Pulm: Clear to auscultation bilaterally. Normal work of breathing on room air. MSK: Negative for extremity edema. Skin: Multiple healing pustules over bilateral anterior thighs, draining serosanguinous fluid. No new pustules noted. R anterior thigh healing punch biopsy sites without increased warmth and erythema. Neuro: Alert and oriented x3. No focal deficit noted. Psych: Normal mood and affect.  Pertinent Labs, Studies, and Procedures:  CBC Latest Ref Rng & Units 05/20/2021 05/18/2021 05/17/2021  WBC 4.0 - 10.5 K/uL 8.0 10.7(H) 8.6  Hemoglobin 12.0 - 15.0 g/dL 10.0(L) 10.4(L) 9.6(L)  Hematocrit 36.0 - 46.0 % 31.2(L) 31.9(L) 30.1(L)  Platelets 150 - 400 K/uL 255 247 221    CMP Latest Ref Rng & Units 05/20/2021 05/20/2021 05/18/2021  Glucose 70 - 99 mg/dL 129(H) - 106(H)  BUN 6 - 20 mg/dL 30(H) - 33(H)  Creatinine 0.44 - 1.00  mg/dL 0.82 0.71 0.71  Sodium 135 - 145 mmol/L 139 - 138  Potassium 3.5 - 5.1 mmol/L 4.5 - 4.5  Chloride 98 - 111 mmol/L 105 - 109  CO2 22 - 32 mmol/L 24 - 22  Calcium 8.9 - 10.3 mg/dL 9.8 - 9.8  Total Protein 6.5 - 8.1 g/dL - - -  Total Bilirubin 0.3 - 1.2 mg/dL - - -  Alkaline Phos 38 - 126 U/L - - -  AST 15 - 41 U/L - - -  ALT 0 - 44 U/L - - -  DG Chest 2 View  Result Date: 04/15/2021 CLINICAL DATA:  Fever.  Nausea and vomiting. EXAM: CHEST - 2 VIEW COMPARISON:  11/19/2020 radiographs and CT FINDINGS: Chronic hyperinflation. Mild bronchial thickening with slight increase from prior exam. No acute airspace disease. Chain sutures at the left lung base with ill-defined streaky opacities, improved from prior radiograph. The heart is normal in size. Normal mediastinal contours with aortic atherosclerosis. No pleural effusion or pneumothorax. Chronic compression fractures in the mid and upper thoracic spine. No acute osseous abnormalities are seen. IMPRESSION: 1. Chronic hyperinflation. Bronchial thickening is increased from prior exam. 2. Postsurgical change at the left lung base with residual streaky opacities. 3. No evidence of acute airspace disease. Electronically Signed   By: Keith Rake M.D.   On: 04/15/2021 19:22   MR Lumbar Spine W Wo Contrast  Result Date: 04/16/2021 CLINICAL DATA:  History of substance abuse common back pain radiating into the hips EXAM: MRI LUMBAR SPINE WITHOUT AND WITH CONTRAST TECHNIQUE: Multiplanar and multiecho pulse sequences of the lumbar spine were obtained without and with intravenous contrast. CONTRAST:  57mL GADAVIST GADOBUTROL 1 MMOL/ML IV SOLN COMPARISON:  Lumbar spine MRI 01/02/2016, CT abdomen/pelvis obtained earlier the same day, CT abdomen/pelvis 11/19/2020, 03/04/2019 FINDINGS: Segmentation: Partial lumbarization of the L5 vertebral body is again seen. The lowest formed disc space is designated L5-S1. This is in keeping with the prior MRI report from  2017. Alignment: There is dextrocurvature centered at L2. There is no antero or retrolisthesis. Vertebrae: There is mild T1 and T2 marrow signal abnormality in the L3 and L4 vertebral bodies without marrow edema. There is mild endplate irregularity. The postcontrast images are moderately motion degraded with poor fat saturation, but there is no convincing enhancement of the vertebral bodies. There is mild STIR signal abnormality in the posterior elements on the left at L2 and L3 and bilaterally at L4. Marrow signal is otherwise normal. Conus medullaris and cauda equina: Conus extends to the mid L1 level. Conus and cauda equina appear normal. Paraspinal and other soft tissues: A left renal cyst is noted. There is STIR signal abnormality in the left perifacetal soft tissues at L2-L3. There is associated postcontrast enhancement (10-11). Disc levels: There is complete obliteration of the disc space at L3-L4 which is unchanged since 11/19/2020 but new since 03/04/2019. The other disc spaces are preserved. T12-L1: No significant spinal canal or neural foraminal stenosis L1-L2: No significant spinal canal or neural foraminal stenosis. L2-L3: There is a mild disc bulge without significant spinal canal or neural foraminal stenosis. L3-L4: There is mild degenerative endplate change and facet arthropathy resulting mild right and no significant left neural foraminal stenosis and no significant spinal canal stenosis L4-L5: There is a minimal disc bulge and advanced bilateral facet arthropathy without significant spinal canal or neural foraminal stenosis. L5-S1: No significant spinal canal or neural foraminal stenosis. IMPRESSION: 1. Motion degraded study particularly affecting the sagittal T1 postcontrast images. 2. Transitional anatomy as above. The lowest formed disc space is designated L5-S1 with sacralization of the L5 vertebral body. 3. Irregularity of the left L2-L3 facet joint with associated marrow edema in the  posterior elements and perifacetal soft tissue edema and enhancement. While this could be degenerative in nature, the finding is suspicious for septic arthritis, and when correlated with prior CT abdomen/pelvis, the irregularity of the facet joint has increased from 11/19/2020 to today. Correlate with symptoms and lab values. 4. Obliteration of the L3-L4 disc space with mild endplate irregularity may reflect  sequela of prior infection. No marrow edema or convincing marrow enhancement to suggest discitis/osteomyelitis at this level on the current study. 5. Facet arthropathy most advanced at L3-L4 and L4-L5. Otherwise, mild multilevel degenerative changes without high-grade spinal canal or neural foraminal stenosis are detailed above. Electronically Signed   By: Valetta Mole M.D.   On: 04/16/2021 10:56   CT Abdomen Pelvis W Contrast  Result Date: 04/16/2021 CLINICAL DATA:  Abdominal pain, acute, nonlocalized. EXAM: CT ABDOMEN AND PELVIS WITH CONTRAST TECHNIQUE: Multidetector CT imaging of the abdomen and pelvis was performed using the standard protocol following bolus administration of intravenous contrast. CONTRAST:  62mL OMNIPAQUE IOHEXOL 350 MG/ML SOLN COMPARISON:  11/19/2020. FINDINGS: Lower chest: The heart is mildly enlarged. Postsurgical changes with associated atelectasis or scarring at the left lung base, improved from the previous exam. Hepatobiliary: No focal liver abnormality is seen. No gallstones, gallbladder wall thickening, or biliary dilatation. Pancreas: Unremarkable. No pancreatic ductal dilatation or surrounding inflammatory changes. Spleen: Normal in size without focal abnormality. Adrenals/Urinary Tract: The adrenal glands are within normal limits. Bilateral renal cysts are noted. The kidneys enhance symmetrically. No renal calculus or hydronephrosis bilaterally. Evaluation of the of the ureters is limited due to overlapping structures and paucity of intra-abdominal fat. The urinary bladder is  decompressed. Stomach/Bowel: There is a small hiatal hernia. The second portion of the duodenum is patulous and unchanged from the prior exam. No bowel obstruction, free air or pneumatosis. Evaluation of the bowel is limited due to paucity of intra-abdominal fat and lack of oral contrast. A normal appendix is seen in the right lower quadrant. Vascular/Lymphatic: Aortic atherosclerosis. No enlarged abdominal or pelvic lymph nodes. Reproductive: The uterus is not visualized on exam. A cystic structure is present in the left adnexa measuring 2.1 cm, unchanged from prior exams. Other: No free fluid Musculoskeletal: Degenerative changes are present in the thoracolumbar spine and bilateral hips. There is fusion of the L3-L4 vertebral bodies. No acute osseous abnormality. IMPRESSION: 1. No acute intra-abdominal process. 2. Postsurgical changes at the left lung base with associated atelectasis or scarring, improved from the prior exam. 3. Bilateral renal cysts. 4. Aortic atherosclerosis. Electronically Signed   By: Brett Fairy M.D.   On: 04/16/2021 02:47     Signed: Farrel Gordon, D.O.  Internal Medicine Resident, PGY-1 [facility]  Pager: [phone] 11:48 AM, 05/20/2021

## 2021-05-19 NOTE — Anesthesia Postprocedure Evaluation (Signed)
Anesthesia Post Note  Patient: Brenda [name]  Procedure(s) Performed: TRANSESOPHAGEAL ECHOCARDIOGRAM (TEE)     Patient location during evaluation: PACU Anesthesia Type: MAC Level of consciousness: awake and alert and oriented Pain management: pain level controlled Vital Signs Assessment: post-procedure vital signs reviewed and stable Respiratory status: spontaneous breathing, nonlabored ventilation and respiratory function stable Cardiovascular status: stable and blood pressure returned to baseline Postop Assessment: no apparent nausea or vomiting Anesthetic complications: no   No notable events documented.  Last Vitals:  Vitals:   05/19/21 1238 05/19/21 1325  BP: (!) 149/75 120/74  Pulse: 73 80  Resp: 14 20  Temp:  36.7 C  SpO2: 97% 96%    Last Pain:  Vitals:   05/19/21 1325  TempSrc:   PainSc: 0-No pain                 Jiah Bari A.

## 2021-05-19 NOTE — Progress Notes (Addendum)
HD#[id number] SUBJECTIVE:  Patient Summary: Brenda [name] is a 61 y.o. with a pertinent PMH of cervical spine spondylosis, OUD, HTN, HLD, who is admitted for septic facet joint L2-L3.   Overnight Events: None  Interim History: Patient is nervous for TEE today but otherwise doing well.  OBJECTIVE:  Vital Signs: Vitals:   05/19/21 1238 05/19/21 1325 05/19/21 1340 05/19/21 1355  BP: (!) 149/75 120/74 (!) 136/116 (!) 141/69  Pulse: 73 80 65 (!) 58  Resp: 14 20 20 20   Temp:  98.1 F (36.7 C)  98.1 F (36.7 C)  TempSrc:      SpO2: 97% 96% 98% 96%  Weight: 50 kg     Height: 5\' 4"  (1.626 m)      Supplemental O2: Room Air SpO2: 96 %  Filed Weights   05/16/21 0700 05/17/21 0447 05/19/21 1238  Weight: 49 kg 50 kg 50 kg     Intake/Output Summary (Last 24 hours) at 05/19/2021 1448 Last data filed at 05/19/2021 1316 Gross per 24 hour  Intake 100 ml  Output --  Net 100 ml   Net IO Since Admission: 12,592.21 mL [05/19/21 1448]  Physical Exam: Constitutional: Chronically ill appearing female, no acute distress. Cardio: Regular rate and rhythm. Systolic murmur noted over 4 posts. Pulm: Clear to auscultation bilaterally. Normal work of breathing on room air. MSK: Negative for extremity edema. Skin: Multiple healing pustules over bilateral anterior thighs, draining serosanguinous fluid. No new pustules noted. R anterior thigh healing punch biopsy sites without increased warmth and erythema. Neuro: Alert and oriented x3. No focal deficit noted. Psych: Normal mood and affect.  Patient Lines/Drains/Airways Status     Active Line/Drains/Airways     Name Placement date Placement time Site Days   Peripheral IV 04/21/21 20 G 1" Left;Lateral Forearm 04/21/21  1153  Forearm  28   PICC Single Lumen 04/21/21 Right Brachial 34 cm 0 cm 04/21/21  1710  Brachial  28   Wound / Incision (Open or Dehisced) 05/11/21 Other (Comment) Thigh Anterior;Right Blister like areas that are opening up and oozing  05/11/21  1330  Thigh  8   Wound / Incision (Open or Dehisced) 05/11/21 Other (Comment) Thigh Anterior;Left Blister like areas that are oozing 05/11/21  1330  Thigh  8             ASSESSMENT/PLAN:  Assessment: Principal Problem:   Septic joint (HCC) Active Problems:   Essential hypertension   Leukocytosis   Opioid use disorder, moderate, dependence (HCC)   Protein-calorie malnutrition, severe   Opiate withdrawal ([place])   Pustules determined by examination   Multiple skin nodules   Nonrheumatic aortic valve insufficiency   Plan: #L2-L3 Discitis #Chronic back pain Diagnosis supported by MRI imaging findings.  Transitioned from ceftriaxone to cefepime 01/31, vancomycin discontinued 01/31. Received one dose of Orbactiv 02/01.  -Infectious disease following, appreciate recommendations -Continue cefepime 2 g twice daily -Continue Suboxone 8-2 mg twice daily and add 1/2 dose in the afternoon -Scheduled tylenol 1000 mg TID, Scheduled Celebrex 200 mg BID. -Scheduled Robaxin 500 mg TID; titrate up if needed daily limit 4g/day for increased pain -Continue lyrica 50 mg TID -Avoid other NSAIDs if possible due to her gastritis   #MRSA Skin Pustules Several draining pustules persist over the anterior and lateral aspects of bilateral thighs, new to the patient during this admission.  Has not had new pustules arise over last several days and those that have been present appear to be healing.  -No  longer need mupirocin topically -Swab of pustule drainage collected with negative gram stain and culture showing NGTD, skin biopsy aerobic/anaerobic cultures show NGTD; acid fast smear negative, and acid fast culture pending -Sutures from biopsy sites on anterior R thigh removed 02/06  #Mitral valve endocarditis New murmur noted on physical exam prompting evaluation for endocarditis. Previous TTE 01/05 was not significant for vegetation but was concerning for thickening of the mitral valve with  regurgitation; trivial tricuspid regurgitation also noted. TEE follow-up 02/06 showed mitral valve endocarditis, moderate central aortic regurgitation. -Follow-up ID recommendations   #Opioid use disorder Patient stable on Suboxone since 04/29/21.  Of note patient does have a systolic murmur not previously documented; echocardiogram 01/05 was notable for mitral valve regurgitations well as aortic valve regurgitation. -Continue Suboxone, increased to half dose in the afternoon.  Can increase to 3 times daily dosing if needed   #Hyperlipidemia -Continue Atorvastatin 20 mg daily    #Underweight -Nutrition following, appreciate their assistance. -Multivitamin, Ensure Enlive 3 times daily, snacks   #Anxiety -Continue Hydroxyzine 50 mg TID   #Normocytic anemia Hgb stable.  Iron studies collected 01/20 showed iron 56 with 16 percent saturation, ferritin of 28.  Given reported leg cramps, we will set ferritin goal to 70.  -Continue Protonix 40 mg BID -Avoid NSAIDS except celebrex 200 mg BID due to gastritis -Continue iron 325 mg daily -Trend CBC   #Hypertension Patient continues to be normotensive on current regimen.  Given recent hyperkalemia, will discontinue losartan and initiate a thiazide diuretic. -Continue Amlodipine 10 mg daily, HCTZ 6.25 mg daily   Best Practice: Diet: Regular diet IVF: Fluids: IV push only, no IV fluids VTE: rivaroxaban (XARELTO) tablet 10 mg Start: 04/22/21 1000 Code: DNR AB: Cefepime 2 g daily, vancomycin 750 mg twice daily DISPO: Anticipated discharge to Home pending  medical stability.  Signature: Farrel Gordon, D.O.  Internal Medicine Resident, PGY-1 [facility]  Pager: [phone] 2:48 PM, 05/19/2021   Please contact the on call pager after 5 pm and on weekends at [phone].

## 2021-05-19 NOTE — Anesthesia Preprocedure Evaluation (Addendum)
Anesthesia Evaluation  Patient identified by MRN, date of birth, ID band Patient awake    Reviewed: Allergy & Precautions, NPO status , Patient's Chart, lab work & pertinent test results, reviewed documented beta blocker date and time   Airway Mallampati: II  TM Distance: >3 FB Neck ROM: Full    Dental no notable dental hx. (+) Dental Advisory Given   Pulmonary pneumonia, resolved, COPD,  COPD inhaler, Current Smoker and Patient abstained from smoking.,  Hx/o lung Ca   Pulmonary exam normal breath sounds clear to auscultation       Cardiovascular hypertension, Pt. on medications + CAD and +CHF  Normal cardiovascular exam Rhythm:Regular Rate:Normal  Echo 04/17/21 1. Left ventricular ejection fraction, by estimation, is 60 to 65%. The left ventricle has normal function. The left ventricle has no regional wall motion abnormalities. Left ventricular diastolic parameters were normal.  2. Right ventricular systolic function is normal. The right ventricular size is normal.  3. Mild mitral valve regurgitation.  4. The aortic valve is tricuspid. Aortic valve regurgitation is mild to moderate.  5. The inferior vena cava is normal in size with greater than 50% respiratory variability, suggesting right atrial pressure of 3 mmHg.   EKG 04/30/21 NSR, Normal EKG   Neuro/Psych PSYCHIATRIC DISORDERS Anxiety Depression Bipolar Disorder TIA Neuromuscular disease    GI/Hepatic GERD  Medicated,(+)     substance abuse  alcohol use, cocaine use and marijuana use, Hx/o IBS   Endo/Other  Malnutrition Hx/o hyperlipidemia  Renal/GU Renal diseaseLeft renal cyst  negative genitourinary   Musculoskeletal  (+) Arthritis , Osteoarthritis,  narcotic dependentDDD lumbar spine with radiculopathy Chronic LBP Facet arthritis lumbar spine   Abdominal   Peds  Hematology  (+) Blood dyscrasia, anemia , Bacteremia   Anesthesia Other Findings    Reproductive/Obstetrics                            Anesthesia Physical Anesthesia Plan  ASA: 3  Anesthesia Plan: MAC   Post-op Pain Management:    Induction: Intravenous  PONV Risk Score and Plan: 1 and Treatment may vary due to age or medical condition and Propofol infusion  Airway Management Planned: Natural Airway and Simple Face Mask  Additional Equipment:   Intra-op Plan:   Post-operative Plan:   Informed Consent: I have reviewed the patients History and Physical, chart, labs and discussed the procedure including the risks, benefits and alternatives for the proposed anesthesia with the patient or authorized representative who has indicated his/her understanding and acceptance.   Patient has DNR.  Discussed DNR with patient and Suspend DNR.   Dental advisory given  Plan Discussed with: Anesthesiologist and CRNA  Anesthesia Plan Comments:         Anesthesia Quick Evaluation

## 2021-05-19 NOTE — Transfer of Care (Signed)
Immediate Anesthesia Transfer of Care Note  Patient: Brenda [name]  Procedure(s) Performed: TRANSESOPHAGEAL ECHOCARDIOGRAM (TEE)  Patient Location: PACU  Anesthesia Type:MAC  Level of Consciousness: awake, alert  and oriented  Airway & Oxygen Therapy: Patient Spontanous Breathing  Post-op Assessment: Report given to RN, Post -op Vital signs reviewed and stable and Patient moving all extremities  Post vital signs: Reviewed and stable  Last Vitals:  Vitals Value Taken Time  BP 120/74 05/19/21 1325  Temp    Pulse 72 05/19/21 1328  Resp 18 05/19/21 1328  SpO2 96 % 05/19/21 1328  Vitals shown include unvalidated device data.  Last Pain:  Vitals:   05/19/21 1325  TempSrc:   PainSc: 0-No pain      Patients Stated Pain Goal: 4 (29/56/21 3086)  Complications: No notable events documented.

## 2021-05-19 NOTE — Anesthesia Procedure Notes (Signed)
Procedure Name: MAC Date/Time: 05/19/2021 1:02 PM Performed by: Amadeo Garnet, CRNA Pre-anesthesia Checklist: Patient identified, Emergency Drugs available, Suction available and Patient being monitored Patient Re-evaluated:Patient Re-evaluated prior to induction Oxygen Delivery Method: Nasal cannula Preoxygenation: Pre-oxygenation with 100% oxygen Induction Type: IV induction Placement Confirmation: positive ETCO2 Dental Injury: Teeth and Oropharynx as per pre-operative assessment

## 2021-05-19 NOTE — Progress Notes (Signed)
[facility] for Infectious Disease    Date of Admission:  04/15/2021   Total days of antibiotics since 1/4          ID: Brenda [name] is a 61 y.o. 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 Nervous about undergoing TEE today 12 point ros is negative except skin lesions Medications:   [MAR Hold] acetaminophen  1,000 mg Oral TID   [MAR Hold] amLODipine  10 mg Oral Daily   [MAR Hold] atorvastatin  20 mg Oral Daily   [MAR Hold] buprenorphine-naloxone  1 tablet Sublingual BID   [MAR Hold] celecoxib  200 mg Oral BID   [MAR Hold] Chlorhexidine Gluconate Cloth  6 each Topical Daily   [MAR Hold] cholecalciferol  2,000 Units Oral Daily   [MAR Hold] feeding supplement  237 mL Oral TID BM   [MAR Hold] ferrous sulfate  325 mg Oral Q breakfast   [MAR Hold] hydrochlorothiazide  6.25 mg Oral Daily   [MAR Hold] hydrOXYzine  50 mg Oral TID   [MAR Hold] melatonin  3 mg Oral QHS   [MAR Hold] methocarbamol  500 mg Oral TID   [MAR Hold] multivitamin with minerals  1 tablet Oral Daily   [MAR Hold] mupirocin cream   Topical BID   [MAR Hold] nicotine  14 mg Transdermal Daily   [MAR Hold] pantoprazole  40 mg Oral BID   [MAR Hold] pregabalin  50 mg Oral TID   [MAR Hold] rivaroxaban  10 mg Oral Daily   [MAR Hold] zolpidem  5 mg Oral QHS    Objective: Vital signs in last 24 hours: Temp:  [97.9 F (36.6 C)-98.6 F (37 C)] 98.6 F (37 C) (02/06 1204) Pulse Rate:  [60-80] 73 (02/06 1238) Resp:  [14-18] 14 (02/06 1238) BP: (99-149)/(53-75) 149/75 (02/06 1238) SpO2:  [94 %-99 %] 97 % (02/06 1238) Weight:  [50 kg] 50 kg (02/06 1238)  Physical Exam  Constitutional:   oriented to person, place, and time. appears well-developed and well-nourished. No distress.  HENT: [place]/AT, PERRLA, no scleral icterus Mouth/Throat: Oropharynx is clear and moist. No oropharyngeal exudate.  Cardiovascular: Normal rate, regular rhythm and normal heart sounds. Exam reveals no gallop and no friction rub.  No murmur heard.  Pulmonary/Chest: Effort normal and breath sounds normal. No respiratory distress.  has no wheezes.  Neck = supple, no nuchal rigidity Abdominal: Soft. Bowel sounds are normal.  exhibits no distension. There is no tenderness.  Lymphadenopathy: no cervical adenopathy. No axillary adenopathy Neurological: alert and oriented to person, place, and time.  Skin: Skin is warm and dry. Numerous papules some with purulent drainage Psychiatric: a normal mood and affect.  behavior is normal.    Lab Results Recent Labs    05/17/21 0354 05/18/21 0352  WBC 8.6 10.7*  HGB 9.6* 10.4*  HCT 30.1* 31.9*  NA 139 138  K 5.2* 4.5  CL 109 109  CO2 24 22  BUN 37* 33*  CREATININE 0.90 0.71   Liver Panel No results for input(s): PROT, ALBUMIN, AST, ALT, ALKPHOS, BILITOT, BILIDIR, IBILI in the last 72 hours. Sedimentation Rate No results for input(s): ESRSEDRATE in the last 72 hours. C-Reactive Protein No  results for input(s): CRP in the last 72 hours.  Microbiology: Reviewed Skin biopsy cx -incubating Studies/Results: No results found.   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 Plan on finishing out course with 2 wk of levofloxacin  Systolic murmur = being evaluated by TEE today for further management  Skin lesions = would recommend wound care to give patient recs on management. Would stop applying mupirocin so that would can dry up. Awaiting path and culture   We will see back in the ID clinic in 2wk  [facility] for Infectious Diseases Pager: [phone]  05/19/2021,  1:08 PM

## 2021-05-19 NOTE — CV Procedure (Signed)
° ° °  TRANSESOPHAGEAL ECHOCARDIOGRAM   NAME:  Brenda [name]    MRN: [id number] DOB:  1960/04/25    ADMIT DATE: 04/15/2021  INDICATIONS: bacteremia  PROCEDURE:   Informed consent was obtained prior to the procedure. The risks, benefits and alternatives for the procedure were discussed and the patient comprehended these risks.  Risks include, but are not limited to, cough, sore throat, vomiting, nausea, somnolence, esophageal and stomach trauma or perforation, bleeding, low blood pressure, aspiration, pneumonia, infection, trauma to the teeth and death.    Procedural time out performed. The oropharynx was anesthetized with topical 1% benzocaine.    Anesthesia was administered by Dr. Royce Macadamia and team.  The patient was administered a total of Propofol 107 mg  to achieve and maintain moderate to deep conscious sedation.  The patient's heart rate, blood pressure, and oxygen saturation are monitored continuously during the procedure. The period of conscious sedation is 10 minutes, of which I was present face-to-face 100% of this time.   The transesophageal probe was inserted in the esophagus and stomach without difficulty and multiple views were obtained.   COMPLICATIONS:    There were no immediate complications.  KEY FINDINGS:  Mitral valve endocarditis. Moderate central aortic regurgitation.  Full report to follow. Further management per primary team.   Rudean Haskell, MD [place]  1:22 PM

## 2021-05-19 NOTE — TOC Progression Note (Signed)
Transition of Care [facility]) - Progression Note    Patient Details  Name: Brenda [name] MRN: [id number] Date of Birth: 1960/09/17  [facility] CM/SW [place], LCSW Phone Number: 05/19/2021, 10:32 AM  Clinical Narrative:    CSW spoke with patient regarding suboxone after discharge. Patient hoping that MD will provide enough for her to discharge with and follow up with a clinic. Patient stated she has a PCP. CSW provided clinic listing and patient expressed appreciation.    Expected Discharge Plan: Home/Self Care Barriers to Discharge: Continued Medical Work up  Expected Discharge Plan and Services Expected Discharge Plan: Home/Self Care In-house Referral: Clinical Social Work   Post Acute Care Choice: [place] Living arrangements for the past 2 months: Apartment                                       Social Determinants of Health (SDOH) Interventions    Readmission Risk Interventions No flowsheet data found.

## 2021-05-19 NOTE — Plan of Care (Signed)
°  Problem: Education: Goal: Knowledge of General Education information will improve Description: Including pain rating scale, medication(s)/side effects and non-pharmacologic comfort measures 05/19/2021 0948 by Iverson Alamin, RN Outcome: Progressing 05/19/2021 0912 by Iverson Alamin, RN Outcome: Progressing   Problem: Health Behavior/Discharge Planning: Goal: Ability to manage health-related needs will improve 05/19/2021 0948 by Iverson Alamin, RN Outcome: Progressing 05/19/2021 0912 by Iverson Alamin, RN Outcome: Progressing   Problem: Clinical Measurements: Goal: Ability to maintain clinical measurements within normal limits will improve 05/19/2021 0948 by Iverson Alamin, RN Outcome: Progressing 05/19/2021 0912 by Iverson Alamin, RN Outcome: Progressing Goal: Will remain free from infection 05/19/2021 0948 by Iverson Alamin, RN Outcome: Progressing 05/19/2021 0912 by Iverson Alamin, RN Outcome: Progressing Goal: Diagnostic test results will improve 05/19/2021 0948 by Iverson Alamin, RN Outcome: Progressing 05/19/2021 0912 by Iverson Alamin, RN Outcome: Progressing Goal: Respiratory complications will improve 05/19/2021 0948 by Iverson Alamin, RN Outcome: Progressing 05/19/2021 0912 by Iverson Alamin, RN Outcome: Progressing Goal: Cardiovascular complication will be avoided 05/19/2021 0948 by Iverson Alamin, RN Outcome: Progressing 05/19/2021 0912 by Iverson Alamin, RN Outcome: Progressing   Problem: Activity: Goal: Risk for activity intolerance will decrease 05/19/2021 0948 by Iverson Alamin, RN Outcome: Progressing 05/19/2021 0912 by Iverson Alamin, RN Outcome: Progressing   Problem: Nutrition: Goal: Adequate nutrition will be maintained 05/19/2021 0948 by Iverson Alamin, RN Outcome: Progressing 05/19/2021 0912 by Iverson Alamin, RN Outcome: Progressing   Problem: Coping: Goal: Level of anxiety will decrease 05/19/2021 0948 by Iverson Alamin, RN Outcome: Progressing 05/19/2021 0912 by Iverson Alamin, RN Outcome: Progressing   Problem: Elimination: Goal: Will not experience complications related to bowel motility 05/19/2021 0948 by Iverson Alamin, RN Outcome: Progressing 05/19/2021 0912 by Iverson Alamin, RN Outcome: Progressing Goal: Will not experience complications related to urinary retention 05/19/2021 0948 by Iverson Alamin, RN Outcome: Progressing 05/19/2021 0912 by Iverson Alamin, RN Outcome: Progressing   Problem: Pain Managment: Goal: General experience of comfort will improve 05/19/2021 0948 by Iverson Alamin, RN Outcome: Progressing 05/19/2021 0912 by Iverson Alamin, RN Outcome: Progressing   Problem: Safety: Goal: Ability to remain free from injury will improve 05/19/2021 0948 by Iverson Alamin, RN Outcome: Progressing 05/19/2021 0912 by Iverson Alamin, RN Outcome: Progressing   Problem: Skin Integrity: Goal: Risk for impaired skin integrity will decrease 05/19/2021 0948 by Iverson Alamin, RN Outcome: Progressing 05/19/2021 0912 by Iverson Alamin, RN Outcome: Progressing

## 2021-05-19 NOTE — Progress Notes (Signed)
Md on call paged that patient has been having loose stool since IV antibiotic was started. Arthor Captain LPN

## 2021-05-19 NOTE — Interval H&P Note (Signed)
History and Physical Interval Note:  05/19/2021 12:48 PM  Brenda [name]  has presented today for surgery, with the diagnosis of BACTEREMIA.  The various methods of treatment have been discussed with the patient and family. After consideration of risks, benefits and other options for treatment, the patient has consented to  Procedure(s): TRANSESOPHAGEAL ECHOCARDIOGRAM (TEE) (N/A) as a surgical intervention.  The patient's history has been reviewed, patient examined, no change in status, stable for surgery.  I have reviewed the patient's chart and labs.  Questions were answered to the patient's satisfaction.     Brenda [name] A Brenda [name]  We have reviewed her history of pill dysphagia.  Discussed unique risks of her case.  If we are unable to safely pass probe will cancel procedure and having GI assessment would then be indicated.

## 2021-05-19 NOTE — Progress Notes (Signed)
Consent forms signed. Patient educated on strict NPO diet. All non PO medication administered.

## 2021-05-20 ENCOUNTER — Other Ambulatory Visit (HOSPITAL_COMMUNITY): Payer: Self-pay

## 2021-05-20 LAB — BASIC METABOLIC PANEL
Anion gap: 10 (ref 5–15)
BUN: 30 mg/dL — ABNORMAL HIGH (ref 6–20)
CO2: 24 mmol/L (ref 22–32)
Calcium: 9.8 mg/dL (ref 8.9–10.3)
Chloride: 105 mmol/L (ref 98–111)
Creatinine, Ser: 0.82 mg/dL (ref 0.44–1.00)
GFR, Estimated: >60 mL/min (ref >60)
Glucose, Bld: 129 mg/dL — ABNORMAL HIGH (ref 70–99)
Potassium: 4.5 mmol/L (ref 3.5–5.1)
Sodium: 139 mmol/L (ref 135–145)

## 2021-05-20 LAB — CREATININE, SERUM
Creatinine, Ser: 0.71 mg/dL (ref 0.44–1.00)
GFR, Estimated: >60 mL/min (ref >60)

## 2021-05-20 LAB — CBC
HCT: 31.2 % — ABNORMAL LOW (ref 36.0–46.0)
Hemoglobin: 10 g/dL — ABNORMAL LOW (ref 12.0–15.0)
MCH: 28.8 pg (ref 26.0–34.0)
MCHC: 32.1 g/dL (ref 30.0–36.0)
MCV: 89.9 fL (ref 80.0–100.0)
Platelets: 255 10*3/uL (ref 150–400)
RBC: 3.47 MIL/uL — ABNORMAL LOW (ref 3.87–5.11)
RDW: 15.5 % (ref 11.5–15.5)
WBC: 8 10*3/uL (ref 4.0–10.5)
nRBC: 0 % (ref 0.0–0.2)

## 2021-05-20 LAB — AEROBIC/ANAEROBIC CULTURE W GRAM STAIN (SURGICAL/DEEP WOUND)

## 2021-05-20 MED ORDER — FERROUS SULFATE 325 (65 FE) MG PO TABS
325.0000 mg | ORAL_TABLET | Freq: Every day | ORAL | 3 refills | Status: AC
Start: 2021-05-21 — End: ?
  Filled 2021-05-20: qty 30, 30d supply, fill #0

## 2021-05-20 MED ORDER — BUPRENORPHINE HCL-NALOXONE HCL 8-2 MG SL SUBL
1.0000 | SUBLINGUAL_TABLET | Freq: Two times a day (BID) | SUBLINGUAL | 0 refills | Status: AC
  Filled 2021-05-20: qty 28, 14d supply, fill #0

## 2021-05-20 MED ORDER — LEVOFLOXACIN 750 MG PO TABS
750.0000 mg | ORAL_TABLET | Freq: Every day | ORAL | 0 refills | Status: AC
  Filled 2021-05-20: qty 14, 14d supply, fill #0

## 2021-05-20 MED ORDER — HYDROXYZINE HCL 25 MG PO TABS
50.0000 mg | ORAL_TABLET | Freq: Three times a day (TID) | ORAL | 0 refills | Status: AC
  Filled 2021-05-20: qty 60, 10d supply, fill #0

## 2021-05-20 MED ORDER — METHOCARBAMOL 500 MG PO TABS
500.0000 mg | ORAL_TABLET | Freq: Three times a day (TID) | ORAL | 1 refills | Status: AC
  Filled 2021-05-20: qty 90, 30d supply, fill #0

## 2021-05-20 MED ORDER — ATORVASTATIN CALCIUM 20 MG PO TABS
20.0000 mg | ORAL_TABLET | Freq: Every day | ORAL | 1 refills | Status: AC
  Filled 2021-05-20: qty 30, 30d supply, fill #0

## 2021-05-20 MED ORDER — VITAMIN D3 25 MCG PO TABS
2000.0000 [IU] | ORAL_TABLET | Freq: Every day | ORAL | 1 refills | Status: AC
  Filled 2021-05-20: qty 60, 30d supply, fill #0

## 2021-05-20 MED ORDER — DOXYCYCLINE HYCLATE 100 MG PO TABS
100.0000 mg | ORAL_TABLET | Freq: Two times a day (BID) | ORAL | 0 refills | Status: AC
  Filled 2021-05-20: qty 28, 14d supply, fill #0

## 2021-05-20 MED ORDER — CELECOXIB 200 MG PO CAPS
200.0000 mg | ORAL_CAPSULE | Freq: Two times a day (BID) | ORAL | 1 refills | Status: AC
Start: 2021-05-20 — End: ?
  Filled 2021-05-20: qty 60, 30d supply, fill #0

## 2021-05-20 MED ORDER — HYDROCHLOROTHIAZIDE 12.5 MG PO TABS
6.2500 mg | ORAL_TABLET | Freq: Every day | ORAL | 1 refills | Status: AC
  Filled 2021-05-20: qty 15, 30d supply, fill #0

## 2021-05-20 MED ORDER — PREGABALIN 50 MG PO CAPS
50.0000 mg | ORAL_CAPSULE | Freq: Three times a day (TID) | ORAL | 1 refills | Status: AC
  Filled 2021-05-20: qty 90, 30d supply, fill #0

## 2021-05-20 NOTE — Plan of Care (Signed)

## 2021-05-20 NOTE — Plan of Care (Signed)
°  Problem: Education: Goal: Knowledge of General Education information will improve Description: Including pain rating scale, medication(s)/side effects and non-pharmacologic comfort measures 05/20/2021 1239 by Iverson Alamin, RN Outcome: Progressing 05/20/2021 1239 by Iverson Alamin, RN Outcome: Progressing   Problem: Health Behavior/Discharge Planning: Goal: Ability to manage health-related needs will improve 05/20/2021 1239 by Iverson Alamin, RN Outcome: Progressing 05/20/2021 1239 by Iverson Alamin, RN Outcome: Progressing   Problem: Clinical Measurements: Goal: Ability to maintain clinical measurements within normal limits will improve 05/20/2021 1239 by Iverson Alamin, RN Outcome: Progressing 05/20/2021 1239 by Iverson Alamin, RN Outcome: Progressing Goal: Will remain free from infection 05/20/2021 1239 by Iverson Alamin, RN Outcome: Progressing 05/20/2021 1239 by Iverson Alamin, RN Outcome: Progressing Goal: Diagnostic test results will improve 05/20/2021 1239 by Iverson Alamin, RN Outcome: Progressing 05/20/2021 1239 by Iverson Alamin, RN Outcome: Progressing Goal: Respiratory complications will improve 05/20/2021 1239 by Iverson Alamin, RN Outcome: Progressing 05/20/2021 1239 by Iverson Alamin, RN Outcome: Progressing Goal: Cardiovascular complication will be avoided 05/20/2021 1239 by Iverson Alamin, RN Outcome: Progressing 05/20/2021 1239 by Iverson Alamin, RN Outcome: Progressing   Problem: Activity: Goal: Risk for activity intolerance will decrease 05/20/2021 1239 by Iverson Alamin, RN Outcome: Progressing 05/20/2021 1239 by Iverson Alamin, RN Outcome: Progressing   Problem: Nutrition: Goal: Adequate nutrition will be maintained 05/20/2021 1239 by Iverson Alamin, RN Outcome: Progressing 05/20/2021 1239 by Iverson Alamin, RN Outcome: Progressing   Problem: Coping: Goal: Level of anxiety will decrease 05/20/2021 1239 by Iverson Alamin, RN Outcome: Progressing 05/20/2021 1239 by Iverson Alamin, RN Outcome: Progressing   Problem: Elimination: Goal: Will not experience complications related to bowel motility 05/20/2021 1239 by Iverson Alamin, RN Outcome: Progressing 05/20/2021 1239 by Iverson Alamin, RN Outcome: Progressing Goal: Will not experience complications related to urinary retention 05/20/2021 1239 by Iverson Alamin, RN Outcome: Progressing 05/20/2021 1239 by Iverson Alamin, RN Outcome: Progressing   Problem: Pain Managment: Goal: General experience of comfort will improve 05/20/2021 1239 by Iverson Alamin, RN Outcome: Progressing 05/20/2021 1239 by Iverson Alamin, RN Outcome: Progressing   Problem: Safety: Goal: Ability to remain free from injury will improve 05/20/2021 1239 by Iverson Alamin, RN Outcome: Progressing 05/20/2021 1239 by Iverson Alamin, RN Outcome: Progressing   Problem: Skin Integrity: Goal: Risk for impaired skin integrity will decrease 05/20/2021 1239 by Iverson Alamin, RN Outcome: Progressing 05/20/2021 1239 by Iverson Alamin, RN Outcome: Progressing

## 2021-05-20 NOTE — Progress Notes (Signed)
PICC removed by IV Team. IV's removed by this RN with no complaints or concerns stated. Patient educated on medication regimen and on any follow up appointments. Personal belongings gathered by patient and stored in personal bags. No complaints or concerns stated.

## 2021-05-22 ENCOUNTER — Encounter (HOSPITAL_COMMUNITY): Payer: Self-pay | Admitting: Internal Medicine

## 2021-05-23 LAB — SURGICAL PATHOLOGY

## 2021-06-03 ENCOUNTER — Other Ambulatory Visit: Payer: Self-pay

## 2021-06-03 ENCOUNTER — Ambulatory Visit: Payer: Medicaid Other | Admitting: Internal Medicine

## 2021-06-03 ENCOUNTER — Encounter: Payer: Self-pay | Admitting: Internal Medicine

## 2021-06-03 VITALS — BP 129/81 | HR 85 | Temp 98.4°F | Ht 63.0 in | Wt 103.0 lb

## 2021-06-03 DIAGNOSIS — L080 Pyoderma: Secondary | ICD-10-CM

## 2021-06-03 DIAGNOSIS — K068 Other specified disorders of gingiva and edentulous alveolar ridge: Secondary | ICD-10-CM

## 2021-06-03 LAB — CBC WITH DIFFERENTIAL/PLATELET
Absolute Monocytes: 646 cells/uL (ref 200–950)
Basophils Absolute: 27 cells/uL (ref 0–200)
Basophils Relative: 0.3 %
Eosinophils Absolute: 664 cells/uL — ABNORMAL HIGH (ref 15–500)
Eosinophils Relative: 7.3 %
HCT: 36.5 % (ref 35.0–45.0)
Hemoglobin: 11.9 g/dL (ref 11.7–15.5)
Lymphs Abs: 3640 cells/uL (ref 850–3900)
MCH: 29.4 pg (ref 27.0–33.0)
MCHC: 32.6 g/dL (ref 32.0–36.0)
MCV: 90.1 fL (ref 80.0–100.0)
MPV: 9.5 fL (ref 7.5–12.5)
Monocytes Relative: 7.1 %
Neutro Abs: 4122 cells/uL (ref 1500–7800)
Neutrophils Relative %: 45.3 %
Platelets: 266 10*3/uL (ref 140–400)
RBC: 4.05 10*6/uL (ref 3.80–5.10)
RDW: 15.6 % — ABNORMAL HIGH (ref 11.0–15.0)
Total Lymphocyte: 40 %
WBC: 9.1 10*3/uL (ref 3.8–10.8)

## 2021-06-03 MED ORDER — FLUCONAZOLE 150 MG PO TABS: 150.0000 mg | ORAL_TABLET | Freq: Every day | ORAL | 0 refills | Status: AC

## 2021-06-03 NOTE — Patient Instructions (Signed)
Finish all her abtx that you are taking today. Do not need to take further

## 2021-06-03 NOTE — Progress Notes (Unsigned)
RFV: follow up for culture negative endocarditis  Patient ID: Brenda [name], female   DOB: 1960/07/08, 61 y.o.   MRN: [id number]  HPI She received 4 wks of daptomycin plus cefepime then was converted to levofloxacin plus one dose of oritvancin to bridge a total of 6 wks of treatment. She had numerous pustular skin lesions to bilateral upper thighs which she had biopsied."Perivascular dermatitis". These lesions are still present and having some new lesions occurring despite being on abtx. She finishes today.  Has hep B (Hep B core ab+, Hep B Surface Ag-) and Hep C ab +( no detectable viral load)  She reports having a yeast infection  Uptodate on her vaccines  Sees dr Inda Merlin in follow up in may  Outpatient Encounter Medications as of 06/03/2021  Medication Sig   amLODipine (NORVASC) 10 MG tablet Take 1 tablet (10 mg total) by mouth daily. (Patient not taking: Reported on 04/16/2021)   aspirin EC 81 MG tablet Take 1 tablet (81 mg total) by mouth daily. (Patient not taking: Reported on 04/16/2021)   atorvastatin (LIPITOR) 20 MG tablet Take 1 tablet (20 mg total) by mouth daily.   buprenorphine-naloxone (SUBOXONE) 8-2 mg SUBL SL tablet Place 1 tablet under the tongue 2 (two) times daily for 14 days.   celecoxib (CELEBREX) 200 MG capsule Take 1 capsule (200 mg total) by mouth 2 (two) times daily.   Vitamin D3 (VITAMIN D) 25 MCG tablet Take 2 tablets (2,000 Units total) by mouth daily.   doxycycline (VIBRA-TABS) 100 MG tablet Take 1 tablet (100 mg total) by mouth 2 (two) times daily for 14 days.   feeding supplement (ENSURE ENLIVE / ENSURE PLUS) LIQD Take 237 mLs by mouth 3 (three) times daily between meals. (Patient not taking: Reported on 04/16/2021)   ferrous sulfate 325 (65 FE) MG tablet Take 1 tablet (325 mg total) by mouth daily with breakfast.   hydrochlorothiazide (HYDRODIURIL) 12.5 MG tablet Take 1/2 tablets (6.25 mg total) by mouth daily.   hydrOXYzine (ATARAX) 25 MG tablet Take 2  tablets (50 mg total) by mouth 3 (three) times daily.   levofloxacin (LEVAQUIN) 750 MG tablet Take 1 tablet (750 mg total) by mouth daily for 14 days. Separate from multi-vitamin by 2 hours   methocarbamol (ROBAXIN) 500 MG tablet Take 1 tablet (500 mg total) by mouth 3 (three) times daily.   Multiple Vitamin (MULTIVITAMIN WITH MINERALS) TABS tablet Take 1 tablet by mouth daily. (Patient not taking: Reported on 04/16/2021)   pregabalin (LYRICA) 50 MG capsule Take 1 capsule (50 mg total) by mouth 3 (three) times daily.   No facility-administered encounter medications on file as of 06/03/2021.     Patient Active Problem List   Diagnosis Date Noted   Nonrheumatic aortic valve insufficiency    Acute bacterial endocarditis    Multiple skin nodules    Pustules determined by examination    Opiate withdrawal ([name])    Septic joint ([place]) 04/16/2021   Protein-calorie malnutrition, severe 11/21/2020   CAP (community acquired pneumonia) 11/19/2020   Hypertensive urgency 11/19/2020   QT prolongation 11/19/2020   MDD (major depressive disorder), recurrent severe, without psychosis ([place]) 03/05/2017   Closed left tibial fracture 03/04/2017   Opioid use disorder, moderate, dependence ([place]) 04/09/2016   Lumbar radiculopathy 12/25/2015   Spondylosis, cervical, with myelopathy and radiculopathy 12/25/2015   Cervical high risk human papillomavirus (HPV) DNA test positive 09/30/2015   Centrilobular emphysema ([place]) 03/12/2015   Primary cancer of left lower lobe  of lung ([place]) 02/13/2015   Prediabetes 11/03/2014   Spinal stenosis in cervical region 02/07/2014   Irritable bowel syndrome 01/10/2014   Gastroesophageal reflux disease without esophagitis 01/10/2014   Abdominal pain 02/27/2013   Leukocytosis 02/26/2013   Renal cyst, left 11/25/2012   Substance use disorder 10/20/2012   Posttraumatic stress disorder 07/19/2012   Bipolar I disorder ([place]) 07/19/2012   Facet arthropathy, multilevel 10/06/2011    Healthcare maintenance 06/07/2010   Tobacco abuse 06/07/2010   Hyperlipidemia 01/24/2010   Anxiety and depression 09/13/2006   Essential hypertension 09/13/2006     Health Maintenance Due  Topic Date Due   COVID-19 Vaccine (1) Never done   Zoster Vaccines- Shingrix (1 of 2) Never done   MAMMOGRAM  09/25/2018   PAP SMEAR-Modifier  07/08/2019   INFLUENZA VACCINE  11/11/2020     Review of Systems  Physical Exam   BP 129/81    Pulse 85    Temp 98.4 F (36.9 C) (Oral)    Ht 5\' 3"  (1.6 m)    Wt 103 lb (46.7 kg)    LMP 09/23/2007    SpO2 100%    BMI 18.25 kg/m    No results found for: CD4TCELL No results found for: CD4TABS No results found for: HIV1RNAQUANT No results found for: HEPBSAB Lab Results  Component Value Date   LABRPR NON REACTIVE 04/17/2021    CBC Lab Results  Component Value Date   WBC 8.0 05/20/2021   RBC 3.47 (L) 05/20/2021   HGB 10.0 (L) 05/20/2021   HCT 31.2 (L) 05/20/2021   PLT 255 05/20/2021   MCV 89.9 05/20/2021   MCH 28.8 05/20/2021   MCHC 32.1 05/20/2021   RDW 15.5 05/20/2021   LYMPHSABS 2.8 05/13/2021   MONOABS 0.7 05/13/2021   EOSABS 2.3 (H) 05/13/2021    BMET Lab Results  Component Value Date   NA 139 05/20/2021   K 4.5 05/20/2021   CL 105 05/20/2021   CO2 24 05/20/2021   GLUCOSE 129 (H) 05/20/2021   BUN 30 (H) 05/20/2021   CREATININE 0.82 05/20/2021   CALCIUM 9.8 05/20/2021   GFRNONAA >60 05/20/2021   GFRAA >60 08/07/2019      Assessment and Plan  Finishes abtx today  Will give a few doses of fluconazole for yeast infection  Reports gum bleeding = will check cbc with diff  Skin lesions = will refer to derm

## 2021-06-23 ENCOUNTER — Emergency Department (HOSPITAL_COMMUNITY): Payer: Medicaid Other

## 2021-06-23 ENCOUNTER — Emergency Department (HOSPITAL_BASED_OUTPATIENT_CLINIC_OR_DEPARTMENT_OTHER): Payer: Medicaid Other

## 2021-06-23 ENCOUNTER — Encounter (HOSPITAL_BASED_OUTPATIENT_CLINIC_OR_DEPARTMENT_OTHER): Payer: Self-pay | Admitting: Emergency Medicine

## 2021-06-23 ENCOUNTER — Emergency Department (HOSPITAL_BASED_OUTPATIENT_CLINIC_OR_DEPARTMENT_OTHER)
Admission: EM | Admit: 2021-06-23 | Discharge: 2021-06-24 | Disposition: A | Discharge status: Home / Self Care | Payer: Medicaid Other | Attending: Emergency Medicine | Admitting: Emergency Medicine

## 2021-06-23 ENCOUNTER — Other Ambulatory Visit: Payer: Self-pay

## 2021-06-23 DIAGNOSIS — M25571 Pain in right ankle and joints of right foot: Secondary | ICD-10-CM | POA: Insufficient documentation

## 2021-06-23 DIAGNOSIS — M546 Pain in thoracic spine: Secondary | ICD-10-CM | POA: Insufficient documentation

## 2021-06-23 DIAGNOSIS — M25551 Pain in right hip: Secondary | ICD-10-CM | POA: Insufficient documentation

## 2021-06-23 DIAGNOSIS — M25562 Pain in left knee: Secondary | ICD-10-CM | POA: Insufficient documentation

## 2021-06-23 DIAGNOSIS — M25552 Pain in left hip: Secondary | ICD-10-CM | POA: Diagnosis not present

## 2021-06-23 DIAGNOSIS — Z79899 Other long term (current) drug therapy: Secondary | ICD-10-CM | POA: Diagnosis not present

## 2021-06-23 DIAGNOSIS — R918 Other nonspecific abnormal finding of lung field: Secondary | ICD-10-CM

## 2021-06-23 DIAGNOSIS — R700 Elevated erythrocyte sedimentation rate: Secondary | ICD-10-CM | POA: Diagnosis not present

## 2021-06-23 DIAGNOSIS — Z7982 Long term (current) use of aspirin: Secondary | ICD-10-CM | POA: Insufficient documentation

## 2021-06-23 DIAGNOSIS — M25561 Pain in right knee: Secondary | ICD-10-CM | POA: Diagnosis not present

## 2021-06-23 DIAGNOSIS — M5489 Other dorsalgia: Secondary | ICD-10-CM

## 2021-06-23 DIAGNOSIS — M25572 Pain in left ankle and joints of left foot: Secondary | ICD-10-CM | POA: Diagnosis not present

## 2021-06-23 DIAGNOSIS — D72829 Elevated white blood cell count, unspecified: Secondary | ICD-10-CM | POA: Insufficient documentation

## 2021-06-23 DIAGNOSIS — R112 Nausea with vomiting, unspecified: Secondary | ICD-10-CM

## 2021-06-23 DIAGNOSIS — F1410 Cocaine abuse, uncomplicated: Secondary | ICD-10-CM

## 2021-06-23 DIAGNOSIS — M545 Low back pain, unspecified: Secondary | ICD-10-CM | POA: Diagnosis not present

## 2021-06-23 DIAGNOSIS — R1110 Vomiting, unspecified: Secondary | ICD-10-CM | POA: Diagnosis not present

## 2021-06-23 LAB — CBC WITH DIFFERENTIAL/PLATELET
Abs Immature Granulocytes: 0.03 10*3/uL (ref 0.00–0.07)
Basophils Absolute: 0 10*3/uL (ref 0.0–0.1)
Basophils Relative: 0 %
Eosinophils Absolute: 0 10*3/uL (ref 0.0–0.5)
Eosinophils Relative: 0 %
HCT: 46.6 % — ABNORMAL HIGH (ref 36.0–46.0)
Hemoglobin: 15.4 g/dL — ABNORMAL HIGH (ref 12.0–15.0)
Immature Granulocytes: 0 %
Lymphocytes Relative: 22 %
Lymphs Abs: 2.3 10*3/uL (ref 0.7–4.0)
MCH: 29.4 pg (ref 26.0–34.0)
MCHC: 33 g/dL (ref 30.0–36.0)
MCV: 89.1 fL (ref 80.0–100.0)
Monocytes Absolute: 0.3 10*3/uL (ref 0.1–1.0)
Monocytes Relative: 3 %
Neutro Abs: 8.1 10*3/uL — ABNORMAL HIGH (ref 1.7–7.7)
Neutrophils Relative %: 75 %
Platelets: 354 10*3/uL (ref 150–400)
RBC: 5.23 MIL/uL — ABNORMAL HIGH (ref 3.87–5.11)
RDW: 15.1 % (ref 11.5–15.5)
WBC: 10.8 10*3/uL — ABNORMAL HIGH (ref 4.0–10.5)
nRBC: 0 % (ref 0.0–0.2)

## 2021-06-23 LAB — COMPREHENSIVE METABOLIC PANEL
ALT: 11 U/L (ref 0–44)
AST: 22 U/L (ref 15–41)
Albumin: 5 g/dL (ref 3.5–5.0)
Alkaline Phosphatase: 68 U/L (ref 38–126)
Anion gap: 10 (ref 5–15)
BUN: 16 mg/dL (ref 6–20)
CO2: 28 mmol/L (ref 22–32)
Calcium: 10.7 mg/dL — ABNORMAL HIGH (ref 8.9–10.3)
Chloride: 102 mmol/L (ref 98–111)
Creatinine, Ser: 0.76 mg/dL (ref 0.44–1.00)
GFR, Estimated: >60 mL/min (ref >60)
Glucose, Bld: 129 mg/dL — ABNORMAL HIGH (ref 70–99)
Potassium: 4.1 mmol/L (ref 3.5–5.1)
Sodium: 140 mmol/L (ref 135–145)
Total Bilirubin: 0.5 mg/dL (ref 0.3–1.2)
Total Protein: 9.9 g/dL — ABNORMAL HIGH (ref 6.5–8.1)

## 2021-06-23 LAB — RAPID URINE DRUG SCREEN, HOSP PERFORMED
Amphetamines: NOT DETECTED
Barbiturates: NOT DETECTED
Benzodiazepines: NOT DETECTED
Cocaine: 1606.2 — AB
Opiates: POSITIVE — AB
Tetrahydrocannabinol: POSITIVE — AB

## 2021-06-23 LAB — URINALYSIS, ROUTINE W REFLEX MICROSCOPIC
Bilirubin Urine: NEGATIVE
Glucose, UA: NEGATIVE mg/dL
Hgb urine dipstick: NEGATIVE
Ketones, ur: NEGATIVE mg/dL
Leukocytes,Ua: NEGATIVE
Nitrite: NEGATIVE
Protein, ur: >300 mg/dL — AB
Specific Gravity, Urine: 1.037 — ABNORMAL HIGH (ref 1.005–1.030)
pH: 8 (ref 5.0–8.0)

## 2021-06-23 LAB — MAGNESIUM: Magnesium: 2.4 mg/dL (ref 1.7–2.4)

## 2021-06-23 LAB — C-REACTIVE PROTEIN: CRP: 0.5 mg/dL (ref <1.0)

## 2021-06-23 LAB — LACTIC ACID, PLASMA
Lactic Acid, Venous: 1.9 mmol/L (ref 0.5–1.9)
Lactic Acid, Venous: 1.7 mmol/L (ref 0.5–1.9)

## 2021-06-23 LAB — SEDIMENTATION RATE: Sed Rate: 39 mm/hr — ABNORMAL HIGH (ref 0–22)

## 2021-06-23 MED ORDER — OXYCODONE-ACETAMINOPHEN 5-325 MG PO TABS
1.0000 | ORAL_TABLET | Freq: Once | ORAL | Status: AC
  Administered 2021-06-24: 1 via ORAL
  Filled 2021-06-23: qty 1

## 2021-06-23 MED ORDER — LABETALOL HCL 5 MG/ML IV SOLN
10.0000 mg | Freq: Once | INTRAVENOUS | Status: AC
  Administered 2021-06-23: 10 mg via INTRAVENOUS
  Filled 2021-06-23: qty 4

## 2021-06-23 MED ORDER — KETOROLAC TROMETHAMINE 30 MG/ML IJ SOLN
15.0000 mg | Freq: Once | INTRAMUSCULAR | Status: AC
  Administered 2021-06-23: 15 mg via INTRAVENOUS
  Filled 2021-06-23: qty 1

## 2021-06-23 MED ORDER — SODIUM CHLORIDE 0.9 % IV SOLN
2.0000 g | Freq: Once | INTRAVENOUS | Status: AC
  Administered 2021-06-23: 2 g via INTRAVENOUS
  Filled 2021-06-23: qty 2

## 2021-06-23 MED ORDER — LORAZEPAM 2 MG/ML IJ SOLN
1.0000 mg | INTRAMUSCULAR | Status: DC | PRN
Start: 2021-06-23 — End: 2021-06-24
  Administered 2021-06-23: 1 mg via INTRAVENOUS
  Filled 2021-06-23 (×2): qty 1

## 2021-06-23 MED ORDER — SODIUM CHLORIDE 0.9 % IV SOLN: 2.0000 g | Freq: Two times a day (BID) | INTRAVENOUS | Status: DC

## 2021-06-23 MED ORDER — VANCOMYCIN HCL IN DEXTROSE 1-5 GM/200ML-% IV SOLN
1000.0000 mg | Freq: Once | INTRAVENOUS | Status: AC
  Administered 2021-06-23: 1000 mg via INTRAVENOUS
  Filled 2021-06-23: qty 200

## 2021-06-23 MED ORDER — HYDROMORPHONE HCL 1 MG/ML IJ SOLN
1.0000 mg | Freq: Once | INTRAMUSCULAR | Status: AC
  Administered 2021-06-23: 1 mg via INTRAVENOUS
  Filled 2021-06-23: qty 1

## 2021-06-23 MED ORDER — VANCOMYCIN HCL 750 MG/150ML IV SOLN
750.0000 mg | Freq: Two times a day (BID) | INTRAVENOUS | Status: DC
  Filled 2021-06-23: qty 150

## 2021-06-23 MED ORDER — HYDROMORPHONE HCL 1 MG/ML IJ SOLN
1.0000 mg | INTRAMUSCULAR | Status: DC | PRN
  Administered 2021-06-23: 1 mg via INTRAVENOUS
  Filled 2021-06-23: qty 1

## 2021-06-23 MED ORDER — ONDANSETRON HCL 8 MG PO TABS: 8.0000 mg | ORAL_TABLET | Freq: Three times a day (TID) | ORAL | 0 refills | Status: AC | PRN

## 2021-06-23 MED ORDER — GADOBUTROL 1 MMOL/ML IV SOLN
5.0000 mL | Freq: Once | INTRAVENOUS | Status: AC | PRN
  Administered 2021-06-23: 5 mL via INTRAVENOUS

## 2021-06-23 NOTE — ED Provider Notes (Addendum)
[name] Provider Note   CSN: [id number] Arrival date & time: 06/23/21  1048     History  Chief Complaint  Patient presents with   Emesis   Back Pain    [facility]da [name] is a 61 y.o. female.  Patient with history of chronic back pain, presents with recurrent back pain today.  Describes a diffuse upper and lower back.  Patient states that she is not sure if she had fevers.  No associated vomiting or diarrhea.  She states that she had a fall earlier today ground-level fall which exacerbated her pain.  Her history is complicated with history of polysubstance abuse, endocarditis, discitis, recent admission with prolonged antibiotic requirement, recently finished antibiotics a week ago.      Home Medications Prior to Admission medications   Medication Sig Start Date End Date Taking? Authorizing Provider  amLODipine (NORVASC) 10 MG tablet Take 1 tablet (10 mg total) by mouth daily. 11/27/20   Elgergawy, Silver Huguenin, MD  aspirin EC 81 MG tablet Take 1 tablet (81 mg total) by mouth daily. Patient not taking: Reported on 04/16/2021 11/26/20   Elgergawy, Silver Huguenin, MD  atorvastatin (LIPITOR) 20 MG tablet Take 1 tablet (20 mg total) by mouth daily. 05/21/21   Farrel Gordon, DO  celecoxib (CELEBREX) 200 MG capsule Take 1 capsule (200 mg total) by mouth 2 (two) times daily. 05/20/21   Farrel Gordon, DO  feeding supplement (ENSURE ENLIVE / ENSURE PLUS) LIQD Take 237 mLs by mouth 3 (three) times daily between meals. 11/26/20   Elgergawy, Silver Huguenin, MD  ferrous sulfate 325 (65 FE) MG tablet Take 1 tablet (325 mg total) by mouth daily with breakfast. 05/21/21   Farrel Gordon, DO  fluconazole (DIFLUCAN) 150 MG tablet Take 1 tablet (150 mg total) by mouth daily. Once and can repeat every other day if yeast infection still present 06/03/21   Carlyle Basques, MD  hydrochlorothiazide (HYDRODIURIL) 12.5 MG tablet Take 1/2 tablets (6.25 mg total) by mouth daily. 05/21/21   Farrel Gordon, DO   hydrOXYzine (ATARAX) 25 MG tablet Take 2 tablets (50 mg total) by mouth 3 (three) times daily. 05/20/21   Farrel Gordon, DO  Multiple Vitamin (MULTIVITAMIN WITH MINERALS) TABS tablet Take 1 tablet by mouth daily. 11/27/20   Elgergawy, Silver Huguenin, MD  pregabalin (LYRICA) 50 MG capsule Take 1 capsule (50 mg total) by mouth 3 (three) times daily. 05/20/21   Farrel Gordon, DO  Vitamin D3 (VITAMIN D) 25 MCG tablet Take 2 tablets (2,000 Units total) by mouth daily. 05/21/21   Farrel Gordon, DO      Allergies    Patient has no known allergies.    Review of Systems   Review of Systems  Constitutional:  Negative for fever.  HENT:  Negative for ear pain.   Eyes:  Negative for pain.  Respiratory:  Negative for cough.   Cardiovascular:  Negative for chest pain.  Gastrointestinal:  Negative for abdominal pain.  Genitourinary:  Negative for flank pain.  Musculoskeletal:  Positive for back pain.  Skin:  Negative for rash.  Neurological:  Negative for headaches.   Physical Exam Updated Vital Signs BP (!) 194/85 (BP Location: Right Arm)    Pulse 79    Temp 98.6 F (37 C) (Temporal)    Resp 20    Ht '5\' 3"'  (1.6 m)    Wt 49.9 kg    LMP 09/23/2007    SpO2 100%    BMI 19.49 kg/m  Physical  Exam Constitutional:      General: She is not in acute distress.    Appearance: Normal appearance.  HENT:     Head: Normocephalic.     Nose: Nose normal.  Eyes:     Extraocular Movements: Extraocular movements intact.  Cardiovascular:     Rate and Rhythm: Normal rate.  Pulmonary:     Effort: Pulmonary effort is normal.  Musculoskeletal:        General: Normal range of motion.     Cervical back: Normal range of motion.     Comments: Patient ranging bilateral hips knees and ankles without pain or discomfort.  Complaining of upper and mid lower back pain.  No midline step-off noted on exam.  Neurological:     General: No focal deficit present.     Mental Status: She is alert. Mental status is at baseline.     Cranial  Nerves: No cranial nerve deficit.     Motor: No weakness.     Comments: Patient is ambulatory without assistance     ED Results / Procedures / Treatments   Labs (all labs ordered are listed, but only abnormal results are displayed) Labs Reviewed  COMPREHENSIVE METABOLIC PANEL - Abnormal; Notable for the following components:      Result Value   Glucose, Bld 129 (*)    Calcium 10.7 (*)    Total Protein 9.9 (*)    All other components within normal limits  CBC WITH DIFFERENTIAL/PLATELET - Abnormal; Notable for the following components:   WBC 10.8 (*)    RBC 5.23 (*)    Hemoglobin 15.4 (*)    HCT 46.6 (*)    Neutro Abs 8.1 (*)    All other components within normal limits  CULTURE, BLOOD (ROUTINE X 2)  CULTURE, BLOOD (ROUTINE X 2)  URINE CULTURE  LACTIC ACID, PLASMA  MAGNESIUM  CBC WITH DIFFERENTIAL/PLATELET  LACTIC ACID, PLASMA  SEDIMENTATION RATE  C-REACTIVE PROTEIN  URINALYSIS, ROUTINE W REFLEX MICROSCOPIC  RAPID URINE DRUG SCREEN, HOSP PERFORMED    EKG EKG Interpretation  Date/Time:  Monday June 23 2021 12:06:48 EDT Ventricular Rate:  63 PR Interval:  125 QRS Duration: 88 QT Interval:  483 QTC Calculation: 495 R Axis:   71 Text Interpretation: Sinus rhythm Atrial premature complex Consider right atrial enlargement Consider left ventricular hypertrophy Borderline prolonged QT interval Confirmed by Thamas Jaegers ([id number]) on 06/23/2021 12:12:28 PM  Radiology CT Thoracic Spine Wo Contrast  Result Date: 06/23/2021 CLINICAL DATA:  Back pain. Fever. Recent fall. History of spinal infection. EXAM: CT THORACIC SPINE WITHOUT CONTRAST TECHNIQUE: Multidetector CT images of the thoracic were obtained using the standard protocol without intravenous contrast. RADIATION DOSE REDUCTION: This exam was performed according to the departmental dose-optimization program which includes automated exposure control, adjustment of the mA and/or kV according to patient size and/or use of  iterative reconstruction technique. COMPARISON:  Radiography 05/02/2014 FINDINGS: Alignment: Mild scoliotic curvature convex to the right. Vertebrae: Old healed partial compression deformities at T4 and T7. No retropulsed bone. Paraspinal and other soft tissues: Negative Disc levels: T1-2: Facet osteoarthritis. Mild bilateral foraminal narrowing. T2-3: Facet osteoarthritis.  Mild bilateral foraminal narrowing. T3-4: Chronic fusion across the facet joints. No canal or foraminal stenosis. T4-5 through T10-11: Normal. No disc or facet pathology. No canal or foraminal stenosis. T11-12: Minimal disc bulge. Bilateral facet osteoarthritis right worse than left. IMPRESSION: No evidence of active spinal infection in the thoracic region. Old healed partial compression fractures at T4 and T7. 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 No sign that this is due to infection. Chronic fusion across the facets at the T3-4 level. Electronically Signed   By: Nelson Chimes M.D.   On: 06/23/2021 13:27   CT Lumbar Spine Wo Contrast  Result Date: 06/23/2021 CLINICAL DATA:  Fever and back pain. History of discitis. Fell today. EXAM: CT LUMBAR SPINE WITHOUT CONTRAST TECHNIQUE: Multidetector CT imaging of the lumbar spine was performed without intravenous contrast administration. Multiplanar CT image reconstructions were also generated. RADIATION DOSE REDUCTION: This exam was performed according to the departmental dose-optimization program which includes automated exposure control, adjustment of the mA and/or kV according to patient size and/or use of iterative reconstruction technique. COMPARISON:  MRI 04/16/2021.  CT abdomen 11/19/2020 and 03/04/2019. FINDINGS: Segmentation: L5 is transitional. Alignment: Scoliotic curvature convex to the right with the apex at L1. Vertebrae: No acute lumbar region finding. Chronic fusion at the L3-4 level which occurred between November of 2020 and August of  2022. Paraspinal and other soft tissues: Negative Disc levels: T12-L1: Minimal disc bulge.  No stenosis. L1-2: No disc abnormality.  Mild facet osteoarthritis.  No stenosis. L2-3: Minimal disc bulge. Facet osteoarthritis left worse than right. No compressive stenosis. This could be a cause of back pain. L3-4: Chronic ankylosis across the disc space and facet joints. No stenosis of the canal or foramina. L4-5: Mild bulging of the disc. Bilateral facet osteoarthritis. No compressive stenosis. The facet arthritis could relate to low back pain. L5-S1: Transitional level.  Wide patency of the canal and foramina. IMPRESSION: No acute finding. No sign of spinal infection in the lumbar region presently. L2-3: Facet osteoarthritis left worse than right, which could be a cause of back pain. L3-4: Ankylosis across this level with solid union and sufficient patency of the canal and foramina. This occurred between November of 2020 and August of 2022. L4-5: Bilateral facet osteoarthritis which could be a cause of low back pain. No apparent neural compression. Electronically Signed   By: Nelson Chimes M.D.   On: 06/23/2021 13:23    Procedures Procedures    Medications Ordered in ED Medications  ceFEPIme (MAXIPIME) 2 g in sodium chloride 0.9 % 100 mL IVPB (2 g Intravenous New Bag/Given 06/23/21 1444)  ceFEPIme (MAXIPIME) 2 g in sodium chloride 0.9 % 100 mL IVPB (has no administration in time range)  vancomycin (VANCOREADY) IVPB 750 mg/150 mL (has no administration in time range)  labetalol (NORMODYNE) injection 10 mg (has no administration in time range)  ketorolac (TORADOL) 30 MG/ML injection 15 mg (15 mg Intravenous Given 06/23/21 1242)  vancomycin (VANCOCIN) IVPB 1000 mg/200 mL premix (0 mg Intravenous Stopped 06/23/21 1444)  HYDROmorphone (DILAUDID) injection 1 mg (1 mg Intravenous Given 06/23/21 1453)    ED Course/ Medical Decision Making/ A&P                           Medical Decision Making Amount and/or  Complexity of Data Reviewed Labs: ordered. Radiology: ordered.  Risk Prescription drug management.   Did show recent admission for discitis, endocarditis.  Cardiac monitoring shows sinus rhythm.  Differential is broad including recurrent discitis, infection.  Although on exam she appears to be ranging her back well when she turns to the left and right.  CT imaging pursued, per radiology no evidence of acute infection, prior infection appears improved.  White count is normal, lactic acid is normal.  CRP and  ESR pending.  Patient given IV Dilaudid as she continues to cry out for IV narcotic medication.  Work-up thus far without indication of acute infection.  If CRP and ESR negative, anticipate outpatient follow-up.        Final Clinical Impression(s) / ED Diagnoses Final diagnoses:  Midline back pain, unspecified back location, unspecified chronicity    Rx / DC Orders ED Discharge Orders     None         Luna Fuse, MD 06/23/21 1456    Luna Fuse, MD 06/23/21 1510

## 2021-06-23 NOTE — ED Notes (Signed)
Pt being sent back from MRI d/t requesting something for pain and claustrophobia. EDP notified. ?

## 2021-06-23 NOTE — ED Notes (Signed)
Patient transported to MRI 
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
## 2021-06-23 NOTE — ED Notes (Signed)
Provider notified of Pt's Pain. ? ?

## 2021-06-23 NOTE — ED Triage Notes (Signed)
Pt complains of nausea and vomiting started yesterday. [place] this morning denies and LOC. Pt is crying in excruiciating 10/10 pain in back. Hx of back pain and states pain is from neck to mid center of back.  ?

## 2021-06-23 NOTE — ED Triage Notes (Signed)
Pt from [place] via [place]. Back pain caused fall this morning, here for MRI ?Last pain med 1625 ? ?130/75 ?HR 60's sinus ?

## 2021-06-23 NOTE — ED Notes (Signed)
Report given to the Charge at [facility] ED. ?

## 2021-06-23 NOTE — ED Notes (Signed)
Pt vomiting and also asking for pain meds. Informed Josh - Medic. ?

## 2021-06-23 NOTE — ED Provider Notes (Signed)
Care transferred to me.  Patient is feeling better after the IV Dilaudid that she states is wearing off.  Her blood pressure has normalized which probably is at least partially from the labetalol given but more likely due to pain improvement.  At this point, I have reviewed her labs and she has a mild leukocytosis and her ESR is mildly elevated and only slightly lower than when she had the discitis last time.  She tells me this feels similar.  She has been having progressive pain since she finished antibiotics.  Unfortunately with her history this is too high risk to assume that this is not infectious, especially with vague/equivalent labs.  We do not have MRI here and while the CTs did not show anything she will need MRI T and L-spine to rule out infection.  She will need to be transferred via [place].  I discussed with Dr. Matilde Sprang who will except ED to ED. ?  ?Sherwood Gambler, MD ?06/23/21 1618 ? ?

## 2021-06-23 NOTE — Progress Notes (Signed)
Pharmacy Antibiotic Note ? ?B[facility] [name] is a 61 y.o. female admitted on 06/23/2021 with sepsis.  Pharmacy has been consulted for vancomycin and cefepime dosing. Pt is afebrile and SCr is WNL. Pt recently completed a prolonged course of antibiotics for culture negative endocarditis.  ? ?Plan: ?Vancomycin 1gm IV x 1 then 750mg  IV Q12H ?Cefepime 2gm IV Q12H  ?F/u renal fxn, C&S, clinical status ? ?Height: 5\' 3"  (160 cm) ?Weight: 49.9 kg (110 lb) ?IBW/kg (Calculated) : 52.4 ? ?Temp (24hrs), Avg:98.6 ?F (37 ?C), Min:98.6 ?F (37 ?C), Max:98.6 ?F (37 ?C) ? ?Recent Labs  ?Lab 06/23/21 ?1240  ?CREATININE 0.76  ?LATICACIDVEN 1.9  ?  ?Estimated Creatinine Clearance: 59.6 mL/min (by C-G formula based on SCr of 0.76 mg/dL).   ? ?No Known Allergies ? ?Antimicrobials this admission: ?Vanc 3/13>> ?Cefepime 3/13>> ? ?Dose adjustments this admission: ?N/A ? ?Microbiology results: ?Pending ? ?Thank you for allowing pharmacy to be a part of this patient?s care. ? ?Zennie Ayars, Rande Lawman ?06/23/2021 12:06 PM ? ?

## 2021-06-23 NOTE — Discharge Instructions (Addendum)
Stop using cocaine and marijuana.  Use Tylenol as needed for pain.  Call your cancer doctor soon as possible for further evaluation of the pulmonary nodules that were seen on the imaging today.  See your medical doctor for treatment of your chronic back pain. ?

## 2021-06-23 NOTE — ED Notes (Signed)
This RN attempted twice to collect Blood Specimens and insert a PIV without success. [place], RN to attempt. ?

## 2021-06-23 NOTE — ED Notes (Signed)
Report given to [facility]. 

## 2021-06-23 NOTE — ED Notes (Signed)
Provider informed of Pt request of transfer to a different hospital. ?

## 2021-06-24 NOTE — ED Notes (Signed)
Patient verbalizes understanding of d/c instructions. Opportunities for questions and answers were provided. Pt d/c from ED and wheeled to lobby where [employer] will be picking pt up per cab voucher d/t having no ride home. ?

## 2021-06-26 ENCOUNTER — Other Ambulatory Visit: Payer: Self-pay | Admitting: Physician Assistant

## 2021-06-26 ENCOUNTER — Emergency Department (HOSPITAL_COMMUNITY): Payer: Medicaid Other

## 2021-06-26 ENCOUNTER — Emergency Department (HOSPITAL_COMMUNITY)
Admission: EM | Admit: 2021-06-26 | Discharge: 2021-06-26 | Disposition: A | Discharge status: Home / Self Care | Payer: Medicaid Other | Attending: Emergency Medicine | Admitting: Emergency Medicine

## 2021-06-26 ENCOUNTER — Telehealth: Payer: Self-pay

## 2021-06-26 ENCOUNTER — Encounter (HOSPITAL_COMMUNITY): Payer: Self-pay | Admitting: Emergency Medicine

## 2021-06-26 DIAGNOSIS — M546 Pain in thoracic spine: Secondary | ICD-10-CM | POA: Insufficient documentation

## 2021-06-26 DIAGNOSIS — R531 Weakness: Secondary | ICD-10-CM | POA: Insufficient documentation

## 2021-06-26 DIAGNOSIS — M545 Low back pain, unspecified: Secondary | ICD-10-CM | POA: Insufficient documentation

## 2021-06-26 DIAGNOSIS — R112 Nausea with vomiting, unspecified: Secondary | ICD-10-CM | POA: Diagnosis not present

## 2021-06-26 DIAGNOSIS — R000 Tachycardia, unspecified: Secondary | ICD-10-CM | POA: Insufficient documentation

## 2021-06-26 DIAGNOSIS — Z7982 Long term (current) use of aspirin: Secondary | ICD-10-CM | POA: Insufficient documentation

## 2021-06-26 DIAGNOSIS — Z79899 Other long term (current) drug therapy: Secondary | ICD-10-CM | POA: Insufficient documentation

## 2021-06-26 LAB — BASIC METABOLIC PANEL
Anion gap: 13 (ref 5–15)
BUN: 24 mg/dL — ABNORMAL HIGH (ref 6–20)
CO2: 23 mmol/L (ref 22–32)
Calcium: 9.7 mg/dL (ref 8.9–10.3)
Chloride: 102 mmol/L (ref 98–111)
Creatinine, Ser: 0.68 mg/dL (ref 0.44–1.00)
GFR, Estimated: >60 mL/min (ref >60)
Glucose, Bld: 115 mg/dL — ABNORMAL HIGH (ref 70–99)
Potassium: 3.9 mmol/L (ref 3.5–5.1)
Sodium: 138 mmol/L (ref 135–145)

## 2021-06-26 LAB — CBC WITH DIFFERENTIAL/PLATELET
Abs Immature Granulocytes: 0.05 10*3/uL (ref 0.00–0.07)
Basophils Absolute: 0.1 10*3/uL (ref 0.0–0.1)
Basophils Relative: 1 %
Eosinophils Absolute: 0.1 10*3/uL (ref 0.0–0.5)
Eosinophils Relative: 1 %
HCT: 43.7 % (ref 36.0–46.0)
Hemoglobin: 14.9 g/dL (ref 12.0–15.0)
Immature Granulocytes: 0 %
Lymphocytes Relative: 26 %
Lymphs Abs: 2.9 10*3/uL (ref 0.7–4.0)
MCH: 30.7 pg (ref 26.0–34.0)
MCHC: 34.1 g/dL (ref 30.0–36.0)
MCV: 89.9 fL (ref 80.0–100.0)
Monocytes Absolute: 0.6 10*3/uL (ref 0.1–1.0)
Monocytes Relative: 6 %
Neutro Abs: 7.6 10*3/uL (ref 1.7–7.7)
Neutrophils Relative %: 66 %
Platelets: 313 10*3/uL (ref 150–400)
RBC: 4.86 MIL/uL (ref 3.87–5.11)
RDW: 14.6 % (ref 11.5–15.5)
WBC: 11.3 10*3/uL — ABNORMAL HIGH (ref 4.0–10.5)
nRBC: 0 % (ref 0.0–0.2)

## 2021-06-26 LAB — URINE CULTURE: Culture: 10000 — AB

## 2021-06-26 MED ORDER — HYDROCODONE-ACETAMINOPHEN 5-325 MG PO TABS
1.0000 | ORAL_TABLET | Freq: Once | ORAL | Status: AC
  Administered 2021-06-26: 1 via ORAL
  Filled 2021-06-26: qty 1

## 2021-06-26 MED ORDER — HYDROCODONE-ACETAMINOPHEN 5-325 MG PO TABS: 1.0000 | ORAL_TABLET | Freq: Four times a day (QID) | ORAL | 0 refills | Status: DC | PRN

## 2021-06-26 MED ORDER — KETOROLAC TROMETHAMINE 30 MG/ML IJ SOLN
30.0000 mg | Freq: Once | INTRAMUSCULAR | Status: AC
  Administered 2021-06-26: 30 mg via INTRAVENOUS
  Filled 2021-06-26: qty 1

## 2021-06-26 MED ORDER — SODIUM CHLORIDE 0.9 % IV BOLUS
1000.0000 mL | Freq: Once | INTRAVENOUS | Status: AC
  Administered 2021-06-26: 1000 mL via INTRAVENOUS

## 2021-06-26 MED ORDER — IOHEXOL 300 MG/ML  SOLN
75.0000 mL | Freq: Once | INTRAMUSCULAR | Status: AC | PRN
  Administered 2021-06-26: 75 mL via INTRAVENOUS

## 2021-06-26 MED ORDER — HYDROMORPHONE HCL 1 MG/ML IJ SOLN
1.0000 mg | Freq: Once | INTRAMUSCULAR | Status: AC
  Administered 2021-06-26: 1 mg via INTRAVENOUS
  Filled 2021-06-26: qty 1

## 2021-06-26 NOTE — Telephone Encounter (Signed)
I have attempted to reach the pt and her emergency contact twice. There was no answer and neither have voicemail's set up, so I was unable to leave a message. ?

## 2021-06-26 NOTE — Telephone Encounter (Signed)
I was calling to relay the following information: ? ?An order for a CT chest has been entered and a scheduling message sent to make a follow up appt after the CT in about 2.5-3 weeks from now (to allow time to get the scan). Can you try to call her and give her instructions about this and advise someone from radiology will call her and she needs to be on the look out and call them back and that the scan needs to be complete before we see her back. ?

## 2021-06-26 NOTE — ED Triage Notes (Signed)
Patient with history of chronic back pain and cancer BIB [facility] from home. Patient seen for same one week ago, referred for follow up with her oncologist but has not done so. Patient states she has been unable to control her pain at home but received dilaudid in ED which was effective. Patient is alert, oriented, and in no apparent distress at this time. ? ?EMS Vitals ?BP 182/96 ?HR 92 ?SpO2 99% on room air ?

## 2021-06-26 NOTE — Discharge Instructions (Signed)
As discussed, your evaluation today has been largely reassuring.  But, it is important that you monitor your condition carefully, and do not hesitate to return to the ED if you develop new, or concerning changes in your condition. ? ?Otherwise, please follow-up with your physician for appropriate ongoing care. ? ?

## 2021-06-26 NOTE — ED Provider Triage Note (Signed)
Emergency Medicine Provider Triage Evaluation Note ? ?[name] , a 61 y.o. female  was evaluated in triage.  Pt complains of back pain.  States this has been going on for weeks.  Patient was recently evaluated in the emergency room on 3/13 and had an MRI done without acute findings.  Patient states she has been taking multiple medications at home without improvement.  She states the only thing that helped was a dose of Dilaudid that she received in the emergency room.  She endorses nausea, vomiting, diarrhea.  Denies chest pain, shortness of breath, palpitations, fever. ? ?Review of Systems  ?Positive: As above ?Negative: As above ? ?Physical Exam  ?BP (!) 204/95 (BP Location: Right Arm)   Pulse 83   Temp 98.7 ?F (37.1 ?C) (Oral)   Resp 20   LMP 09/23/2007   SpO2 100%  ?Gen:   Awake, no distress   ?Resp:  Normal effort  ?MSK:   Moves extremities without difficulty  ?Other:  Generalized tenderness of her paraspinal muscles and spine. ? ?Medical Decision Making  ?Medically screening exam initiated at 3:28 PM.  Appropriate orders placed.  SHARAINE DELANGE was informed that the remainder of the evaluation will be completed by another provider, this initial triage assessment does not replace that evaluation, and the importance of remaining in the ED until their evaluation is complete. ? ? ?  ?Evlyn Courier, PA-C ?06/26/21 1530 ? ?

## 2021-06-26 NOTE — ED Provider Notes (Signed)
?[facility] ?Provider Note ? ? ?CSN: [id number] ?Arrival date & time: 06/26/21  1510 ? ?  ? ?History ? ?Chief Complaint  ?Patient presents with  ? Back Pain  ? ? ?Brenda [name] is a 61 y.o. female. ? ?HPI ?Patient presents with concern of nausea, vomiting, weakness.  She states that she believes that she has recent back/spine infection, was on antibiotics.  She has not followed up with pain management, nor neurosurgery, and today was per chart review, supposed to follow-up with oncology due to nodules identified on recent MRI.  She notes that her pain is diffuse throughout the back, thorax not improved with OTC medication.  There is associated nausea, anorexia and difficulty taking her home medication regimen. ?  ? ?Home Medications ?Prior to Admission medications   ?Medication Sig Start Date End Date Taking? Authorizing Provider  ?amLODipine (NORVASC) 10 MG tablet Take 1 tablet (10 mg total) by mouth daily. 11/27/20   Elgergawy, Silver Huguenin, MD  ?aspirin EC 81 MG tablet Take 1 tablet (81 mg total) by mouth daily. ?Patient not taking: Reported on 04/16/2021 11/26/20   Elgergawy, Silver Huguenin, MD  ?atorvastatin (LIPITOR) 20 MG tablet Take 1 tablet (20 mg total) by mouth daily. 05/21/21   Farrel Gordon, DO  ?celecoxib (CELEBREX) 200 MG capsule Take 1 capsule (200 mg total) by mouth 2 (two) times daily. 05/20/21   Farrel Gordon, DO  ?feeding supplement (ENSURE ENLIVE / ENSURE PLUS) LIQD Take 237 mLs by mouth 3 (three) times daily between meals. 11/26/20   Elgergawy, Silver Huguenin, MD  ?ferrous sulfate 325 (65 FE) MG tablet Take 1 tablet (325 mg total) by mouth daily with breakfast. 05/21/21   Farrel Gordon, DO  ?fluconazole (DIFLUCAN) 150 MG tablet Take 1 tablet (150 mg total) by mouth daily. Once and can repeat every other day if yeast infection still present 06/03/21   Carlyle Basques, MD  ?hydrochlorothiazide (HYDRODIURIL) 12.5 MG tablet Take 1/2 tablets (6.25 mg total) by mouth daily. 05/21/21   Farrel Gordon,  DO  ?hydrOXYzine (ATARAX) 25 MG tablet Take 2 tablets (50 mg total) by mouth 3 (three) times daily. 05/20/21   Farrel Gordon, DO  ?Multiple Vitamin (MULTIVITAMIN WITH MINERALS) TABS tablet Take 1 tablet by mouth daily. 11/27/20   Elgergawy, Silver Huguenin, MD  ?ondansetron (ZOFRAN) 8 MG tablet Take 1 tablet (8 mg total) by mouth every 8 (eight) hours as needed for nausea or vomiting. 06/23/21   Daleen Bo, MD  ?pregabalin (LYRICA) 50 MG capsule Take 1 capsule (50 mg total) by mouth 3 (three) times daily. 05/20/21   Farrel Gordon, DO  ?Vitamin D3 (VITAMIN D) 25 MCG tablet Take 2 tablets (2,000 Units total) by mouth daily. 05/21/21   Farrel Gordon, DO  ?   ? ?Allergies    ?Patient has no known allergies.   ? ?Review of Systems   ?Review of Systems  ?Constitutional:   ?     Per HPI, otherwise negative  ?HENT:    ?     Per HPI, otherwise negative  ?Respiratory:    ?     Per HPI, otherwise negative  ?Cardiovascular:   ?     Per HPI, otherwise negative  ?Gastrointestinal:  Positive for nausea. Negative for vomiting.  ?Endocrine:  ?     Negative aside from HPI  ?Genitourinary:   ?     Neg aside from HPI   ?Musculoskeletal:   ?     Per HPI, otherwise  negative  ?Skin: Negative.   ?Neurological:  Positive for weakness. Negative for syncope.  ? ?Physical Exam ?Updated Vital Signs ?BP (!) 188/72 (BP Location: Right Arm)   Pulse 69   Temp 98.7 ?F (37.1 ?C) (Oral)   Resp (!) 21   LMP 09/23/2007   SpO2 99%  ?Physical Exam ?Constitutional:   ?   General: She is not in acute distress. ?   Appearance: Normal appearance.  ?HENT:  ?   Head: Normocephalic.  ?   Nose: Nose normal.  ?Eyes:  ?   Extraocular Movements: Extraocular movements intact.  ?Cardiovascular:  ?   Rate and Rhythm: Tachycardia present.  ?Pulmonary:  ?   Effort: Pulmonary effort is normal.  ?Musculoskeletal:     ?   General: Normal range of motion.  ?   Cervical back: Normal range of motion.  ?   Comments: Patient ranging bilateral hips knees and ankles without pain or  discomfort.  Complaining of upper and mid lower back pain.  No midline step-off noted on exam.  ?Neurological:  ?   General: No focal deficit present.  ?   Mental Status: She is alert. Mental status is at baseline.  ?   Cranial Nerves: No cranial nerve deficit.  ?   Motor: No weakness.  ?   Comments: Patient is ambulatory without assistance ?  ?Psychiatric:  ?   Comments: Anxious  ? ? ?ED Results / Procedures / Treatments   ?Labs ?(all labs ordered are listed, but only abnormal results are displayed) ?Labs Reviewed  ?CBC WITH DIFFERENTIAL/PLATELET - Abnormal; Notable for the following components:  ?    Result Value  ? WBC 11.3 (*)   ? All other components within normal limits  ?BASIC METABOLIC PANEL - Abnormal; Notable for the following components:  ? Glucose, Bld 115 (*)   ? BUN 24 (*)   ? All other components within normal limits  ? ? ?EKG ?None ? ?Radiology ?CT Chest W Contrast ? ?Result Date: 06/26/2021 ?CLINICAL DATA:  Abnormal xray - lung nodule, >= 1 cm Lung nodule, > 44mm. Back pain. EXAM: CT CHEST WITH CONTRAST TECHNIQUE: Multidetector CT imaging of the chest was performed during intravenous contrast administration. RADIATION DOSE REDUCTION: This exam was performed according to the departmental dose-optimization program which includes automated exposure control, adjustment of the mA and/or kV according to patient size and/or use of iterative reconstruction technique. CONTRAST:  66mL OMNIPAQUE IOHEXOL 300 MG/ML  SOLN COMPARISON:  Most recent chest x-ray 04/15/2021. Chest CT 11/19/2020 FINDINGS: Cardiovascular: Heart is normal size. Aorta is normal caliber. Scattered aortic atherosclerosis. Mediastinum/Nodes: No mediastinal, hilar, or axillary adenopathy. Trachea and esophagus are unremarkable. Thyroid unremarkable. Lungs/Pleura: Suture line in the left lower lobe. Adjacent soft tissue decreased overall since prior study, now currently measuring maximally 1.6 x 1.0 cm on image 107 of series 7 compared with 3.7  x 1.9 cm previously. Nodule in the superior segment of the left lower lobe along the fissure measures 5 mm on image 59. This is new since prior study. Posterior right upper lobe nodule measures approximately 7 mm in greatest diameter on image 59, new since prior study. No effusions. Upper Abdomen: Imaging into the upper abdomen demonstrates no acute findings. Musculoskeletal: Mild compression fracture in the midthoracic spine is stable since prior study. No acute bony abnormality or focal bone lesion. IMPRESSION: Postoperative changes in the left lower lobe. Previously seen airspace disease around the suture line has improved suggesting this was likely a post  infectious/inflammatory process. Residual density around the suture could reflect scarring. New nodules in the posterior right upper lobe and superior segment of the left lower lobe. These are concerning for possible metastases, but are below the size typically visible on PET CT. Recommend close interval follow-up with repeat CT in 3-6 months to assess these areas for change. Electronically Signed   By: Rolm Baptise M.D.   On: 06/26/2021 19:38   ? ?Procedures ?Procedures  ? ? ?Medications Ordered in ED ?Medications  ?sodium chloride 0.9 % bolus 1,000 mL (1,000 mLs Intravenous New Bag/Given 06/26/21 1900)  ?ketorolac (TORADOL) 30 MG/ML injection 30 mg (30 mg Intravenous Given 06/26/21 1727)  ?HYDROmorphone (DILAUDID) injection 1 mg (1 mg Intravenous Given 06/26/21 1727)  ?HYDROmorphone (DILAUDID) injection 1 mg (1 mg Intravenous Given 06/26/21 1901)  ?iohexol (OMNIPAQUE) 300 MG/ML solution 75 mL (75 mLs Intravenous Contrast Given 06/26/21 1931)  ? ? ?ED Course/ Medical Decision Making/ A&P ?This patient presents to the ED for concern of back pain, diffuse pain, nausea, anorexia, vomiting in the context of recent suppose diagnosis of a spinal infection, this involves an extensive number of treatment options, and is a complaint that carries with it a high risk of  complications and morbidity.  The differential diagnosis includes bacteremia, sepsis, and after chart review was performed, with notation of abnormalities identified in the thorax on recent imaging pneumonia, malignancy

## 2021-06-27 ENCOUNTER — Telehealth: Payer: Self-pay | Admitting: Emergency Medicine

## 2021-06-27 NOTE — Telephone Encounter (Signed)
Post ED Visit - Positive Culture Follow-up ? ?Culture report reviewed by antimicrobial stewardship pharmacist: ?[facility] Team ?[]  Elenor Quinones, Pharm.D. ?[x]  Heide Guile, Pharm.D., BCPS AQ-ID ?[]  Parks Neptune, Pharm.D., BCPS ?[]  Alycia Rossetti, Pharm.D., BCPS ?[]  Drasco, Pharm.D., BCPS, AAHIVP ?[]  Legrand Como, Pharm.D., BCPS, AAHIVP ?[]  Salome Arnt, PharmD, BCPS ?[]  Johnnette Gourd, PharmD, BCPS ?[]  Hughes Better, PharmD, BCPS ?[]  Leeroy Cha, PharmD ?[]  Laqueta Linden, PharmD, BCPS ?[]  Albertina Parr, PharmD ? ?[facility] Team ?[]  Leodis Sias, PharmD ?[]  Lindell Spar, PharmD ?[]  Royetta Asal, PharmD ?[]  Graylin Shiver, Rph ?[]  Rema Fendt) Glennon Mac, PharmD ?[]  Arlyn Dunning, PharmD ?[]  Netta Cedars, PharmD ?[]  Dia Sitter, PharmD ?[]  Leone Haven, PharmD ?[]  Gretta Arab, PharmD ?[]  Theodis Shove, PharmD ?[]  Peggyann Juba, PharmD ?[]  Reuel Boom, PharmD ? ? ?Positive urine culture ?No further patient follow-up is required at this time. ? ?Milus Mallick ?06/27/2021, 12:36 PM ?  ?

## 2021-06-28 ENCOUNTER — Encounter (HOSPITAL_BASED_OUTPATIENT_CLINIC_OR_DEPARTMENT_OTHER): Payer: Self-pay | Admitting: Emergency Medicine

## 2021-06-28 ENCOUNTER — Other Ambulatory Visit: Payer: Self-pay

## 2021-06-28 ENCOUNTER — Emergency Department (HOSPITAL_BASED_OUTPATIENT_CLINIC_OR_DEPARTMENT_OTHER)
Admission: EM | Admit: 2021-06-28 | Discharge: 2021-06-28 | Disposition: A | Discharge status: Home / Self Care | Payer: Medicaid Other | Attending: Student | Admitting: Student

## 2021-06-28 DIAGNOSIS — Z7982 Long term (current) use of aspirin: Secondary | ICD-10-CM | POA: Insufficient documentation

## 2021-06-28 DIAGNOSIS — R1110 Vomiting, unspecified: Secondary | ICD-10-CM | POA: Insufficient documentation

## 2021-06-28 DIAGNOSIS — R519 Headache, unspecified: Secondary | ICD-10-CM | POA: Diagnosis not present

## 2021-06-28 DIAGNOSIS — G8929 Other chronic pain: Secondary | ICD-10-CM

## 2021-06-28 DIAGNOSIS — M546 Pain in thoracic spine: Secondary | ICD-10-CM | POA: Diagnosis not present

## 2021-06-28 DIAGNOSIS — Z79899 Other long term (current) drug therapy: Secondary | ICD-10-CM | POA: Insufficient documentation

## 2021-06-28 DIAGNOSIS — Z85118 Personal history of other malignant neoplasm of bronchus and lung: Secondary | ICD-10-CM | POA: Diagnosis not present

## 2021-06-28 DIAGNOSIS — I10 Essential (primary) hypertension: Secondary | ICD-10-CM | POA: Insufficient documentation

## 2021-06-28 DIAGNOSIS — M545 Low back pain, unspecified: Secondary | ICD-10-CM | POA: Diagnosis not present

## 2021-06-28 DIAGNOSIS — M549 Dorsalgia, unspecified: Secondary | ICD-10-CM | POA: Diagnosis present

## 2021-06-28 LAB — COMPREHENSIVE METABOLIC PANEL
ALT: 14 U/L (ref 0–44)
AST: 20 U/L (ref 15–41)
Albumin: 4.6 g/dL (ref 3.5–5.0)
Alkaline Phosphatase: 74 U/L (ref 38–126)
Anion gap: 10 (ref 5–15)
BUN: 18 mg/dL (ref 6–20)
CO2: 22 mmol/L (ref 22–32)
Calcium: 9.6 mg/dL (ref 8.9–10.3)
Chloride: 105 mmol/L (ref 98–111)
Creatinine, Ser: 0.58 mg/dL (ref 0.44–1.00)
GFR, Estimated: >60 mL/min (ref >60)
Glucose, Bld: 114 mg/dL — ABNORMAL HIGH (ref 70–99)
Potassium: 3.7 mmol/L (ref 3.5–5.1)
Sodium: 137 mmol/L (ref 135–145)
Total Bilirubin: 0.7 mg/dL (ref 0.3–1.2)
Total Protein: 9 g/dL — ABNORMAL HIGH (ref 6.5–8.1)

## 2021-06-28 LAB — CBC
HCT: 41.9 % (ref 36.0–46.0)
Hemoglobin: 14.5 g/dL (ref 12.0–15.0)
MCH: 30.5 pg (ref 26.0–34.0)
MCHC: 34.6 g/dL (ref 30.0–36.0)
MCV: 88.2 fL (ref 80.0–100.0)
Platelets: 325 10*3/uL (ref 150–400)
RBC: 4.75 MIL/uL (ref 3.87–5.11)
RDW: 14.5 % (ref 11.5–15.5)
WBC: 13.9 10*3/uL — ABNORMAL HIGH (ref 4.0–10.5)
nRBC: 0 % (ref 0.0–0.2)

## 2021-06-28 LAB — CULTURE, BLOOD (ROUTINE X 2)
Culture: NO GROWTH
Special Requests: ADEQUATE

## 2021-06-28 LAB — C-REACTIVE PROTEIN: CRP: 0.5 mg/dL (ref <1.0)

## 2021-06-28 LAB — LIPASE, BLOOD: Lipase: 58 U/L — ABNORMAL HIGH (ref 11–51)

## 2021-06-28 LAB — SEDIMENTATION RATE: Sed Rate: 20 mm/hr (ref 0–22)

## 2021-06-28 MED ORDER — HYDROCODONE-ACETAMINOPHEN 5-325 MG PO TABS: 2.0000 | ORAL_TABLET | ORAL | 0 refills | Status: AC | PRN

## 2021-06-28 MED ORDER — METOCLOPRAMIDE HCL 10 MG PO TABS: 10.0000 mg | ORAL_TABLET | Freq: Four times a day (QID) | ORAL | 0 refills | Status: AC

## 2021-06-28 MED ORDER — HYDROCODONE-ACETAMINOPHEN 5-325 MG PO TABS
2.0000 | ORAL_TABLET | Freq: Once | ORAL | Status: AC
  Administered 2021-06-28: 2 via ORAL
  Filled 2021-06-28: qty 2

## 2021-06-28 MED ORDER — METOCLOPRAMIDE HCL 10 MG PO TABS
10.0000 mg | ORAL_TABLET | Freq: Once | ORAL | Status: AC
  Administered 2021-06-28: 10 mg via ORAL
  Filled 2021-06-28: qty 1

## 2021-06-28 MED ORDER — HYDROMORPHONE HCL 1 MG/ML IJ SOLN
1.0000 mg | Freq: Once | INTRAMUSCULAR | Status: AC
  Administered 2021-06-28: 1 mg via INTRAVENOUS
  Filled 2021-06-28: qty 1

## 2021-06-28 MED ORDER — ONDANSETRON 4 MG PO TBDP
4.0000 mg | ORAL_TABLET | Freq: Once | ORAL | Status: AC | PRN
  Administered 2021-06-28: 4 mg via ORAL
  Filled 2021-06-28: qty 1

## 2021-06-28 MED ORDER — MORPHINE SULFATE (PF) 4 MG/ML IV SOLN
4.0000 mg | Freq: Once | INTRAVENOUS | Status: AC
  Administered 2021-06-28: 4 mg via INTRAVENOUS
  Filled 2021-06-28: qty 1

## 2021-06-28 NOTE — ED Provider Notes (Signed)
Patient discussed and care taken over from previous provider Autry PA-C at shift change, please see their note for full HPI.  ? ?Physical Exam  ?BP (!) 171/74   Pulse 69   Temp 98.3 ?F (36.8 ?C)   Resp 18   Ht '5\' 3"'  (1.6 m)   Wt 49.9 kg   LMP 09/23/2007   SpO2 99%   BMI 19.49 kg/m?  ? ?Physical Exam ?Vitals and nursing note reviewed.  ?Constitutional:   ?   Appearance: Normal appearance.  ?HENT:  ?   Head: Normocephalic and atraumatic.  ?Eyes:  ?   Conjunctiva/sclera: Conjunctivae normal.  ?Cardiovascular:  ?   Rate and Rhythm: Normal rate and regular rhythm.  ?Pulmonary:  ?   Effort: Pulmonary effort is normal. No respiratory distress.  ?   Breath sounds: Normal breath sounds.  ?Abdominal:  ?   General: There is no distension.  ?   Palpations: Abdomen is soft.  ?   Tenderness: There is no abdominal tenderness.  ?Skin: ?   General: Skin is warm and dry.  ?Neurological:  ?   General: No focal deficit present.  ?   Mental Status: She is alert.  ? ? ?Procedures  ?Procedures ? ?ED Course / MDM  ?  ?Medical Decision Making ?Amount and/or Complexity of Data Reviewed ?Labs: ordered. ? ?Risk ?Prescription drug management. ? ? ?Patient is a 61 year old female with history of IV drug use, lung cancer, and hypertension who presents to the emergency department with 10 days of worsening back pain.  Of note patient was admitted at the end of January of this year for septic arthritis of L2-L3, she subsequently developed endocarditis. ? ?Plan of care at time of discharge was to wait on patient's ESR/CRP.  If this is elevated from her prior visit on 3/13, patient warrants further work-up for potential infection/discitis.  If inflammatory markers are decreased, her pain is likely related to chronic pain and patient needs a follow-up with her primary care provider and pain management. ? ?ESR 20, improved compared to prior of 39 on 3/13. ? ?On my evaluation, patient states that her pain is worsening.  She was given a dose of  Phenergan and Vicodin.  On reevaluation she states that her pain is starting to improve.  I discussed with the patient results of her blood work.  I have very low suspicion for acute infection causing her symptoms today.  I am encouraged by the fact that she was able to keep down most recent doses of medications, and that tells me she will be able to take p.o. medicine at home.   ? ?I discharged the patient on antiemetics and analgesics, and sent referrals to both neurosurgery and pain management.  I do not believe patient's requiring admission or inpatient treatment for her symptoms.  Patient was encouraged to follow-up with her primary doctor about her symptoms as well.  Patient was discharged in stable condition. ? ?I discussed this case with my attending physician Dr. Matilde Sprang who cosigned this note including patient's presenting symptoms, physical exam, and planned diagnostics and interventions. Attending physician stated agreement with plan or made changes to plan which were implemented.   ?  ?Kateri Plummer, PA-C ?06/28/21 2206 ? ?  ?Teressa Lower, MD ?06/28/21 2356 ? ?

## 2021-06-28 NOTE — ED Provider Notes (Signed)
?[facility] ?Provider Note ? ? ?CSN: [id number] ?Arrival date & time: 06/28/21  1555 ? ?  ? ?History ? ?Chief Complaint  ?Patient presents with  ? Emesis  ? Back Pain  ? ? ?Brenda [name] is a 61 y.o. female. With past medical history of IVDU, lung cancer, hypertension who presents to the emergency department with back pain. ? ?States she has had 10 days of worsening back pain. States it is now severe. She describes it as her "entire spine." She has associated vomiting due to pain. She has been attempting Vicodin for pain but "throwing it up." She denies fevers. Denies falls or trauma. Endorses headache, but states this is from her blood pressure. Has been trying to take HTN medication, but vomiting. She has a history of discitis. ? ?Notably she has been seen for the same twice previously over the past week. She was seen at [facility] ED on 06/23/21 for back pain and emesis. At that time reported fall earlier that day which exacerbated pain. At this visit she has WBC 10.8. Bcx, lactic, ESR, CRP obtained. CT thoracic and lumbar spine w/o ordered showing no active spinal infection. Given antibiotics, pain control. She was ED to ED transferred for MRI which showed no infection. She was seen again on 06/26/21 at [facility] ED for back pain. WBC 11.3 here with CT Chest w/ contrast showing no infection. She was discharged.  ? ?Remotely, she was admitted on 04/15/21-05/20/21 for septic arthritis of L2-L3. She subsequently developed endocarditis. Required 6 weeks IV antibiotic treatment. Received 4 weeks Dapto and cefepime, as well as Levofloxacin.  ? ? ?Emesis ?Associated symptoms: headaches   ?Associated symptoms: no cough and no fever   ?Back Pain ?Associated symptoms: headaches   ?Associated symptoms: no chest pain and no fever   ? ?  ? ?Home Medications ?Prior to Admission medications   ?Medication Sig Start Date End Date Taking? Authorizing Provider  ?amLODipine (NORVASC) 10 MG tablet Take 1 tablet (10 mg  total) by mouth daily. 11/27/20   Elgergawy, Silver Huguenin, MD  ?aspirin EC 81 MG tablet Take 1 tablet (81 mg total) by mouth daily. ?Patient not taking: Reported on 04/16/2021 11/26/20   Elgergawy, Silver Huguenin, MD  ?atorvastatin (LIPITOR) 20 MG tablet Take 1 tablet (20 mg total) by mouth daily. 05/21/21   Farrel Gordon, DO  ?celecoxib (CELEBREX) 200 MG capsule Take 1 capsule (200 mg total) by mouth 2 (two) times daily. 05/20/21   Farrel Gordon, DO  ?feeding supplement (ENSURE ENLIVE / ENSURE PLUS) LIQD Take 237 mLs by mouth 3 (three) times daily between meals. 11/26/20   Elgergawy, Silver Huguenin, MD  ?ferrous sulfate 325 (65 FE) MG tablet Take 1 tablet (325 mg total) by mouth daily with breakfast. 05/21/21   Farrel Gordon, DO  ?fluconazole (DIFLUCAN) 150 MG tablet Take 1 tablet (150 mg total) by mouth daily. Once and can repeat every other day if yeast infection still present 06/03/21   Carlyle Basques, MD  ?hydrochlorothiazide (HYDRODIURIL) 12.5 MG tablet Take 1/2 tablets (6.25 mg total) by mouth daily. 05/21/21   Farrel Gordon, DO  ?HYDROcodone-acetaminophen (NORCO/VICODIN) 5-325 MG tablet Take 1 tablet by mouth every 6 (six) hours as needed for severe pain. 06/26/21   Carmin Muskrat, MD  ?hydrOXYzine (ATARAX) 25 MG tablet Take 2 tablets (50 mg total) by mouth 3 (three) times daily. 05/20/21   Farrel Gordon, DO  ?Multiple Vitamin (MULTIVITAMIN WITH MINERALS) TABS tablet Take 1 tablet by mouth daily. 11/27/20  Elgergawy, Silver Huguenin, MD  ?ondansetron (ZOFRAN) 8 MG tablet Take 1 tablet (8 mg total) by mouth every 8 (eight) hours as needed for nausea or vomiting. 06/23/21   Daleen Bo, MD  ?pregabalin (LYRICA) 50 MG capsule Take 1 capsule (50 mg total) by mouth 3 (three) times daily. 05/20/21   Farrel Gordon, DO  ?Vitamin D3 (VITAMIN D) 25 MCG tablet Take 2 tablets (2,000 Units total) by mouth daily. 05/21/21   Farrel Gordon, DO  ?   ? ?Allergies    ?Patient has no known allergies.   ? ?Review of Systems   ?Review of Systems  ?Constitutional:  Negative for  fever.  ?Respiratory:  Negative for cough and shortness of breath.   ?Cardiovascular:  Negative for chest pain.  ?Gastrointestinal:  Positive for vomiting.  ?Musculoskeletal:  Positive for back pain.  ?Neurological:  Positive for headaches.  ?All other systems reviewed and are negative. ? ?Physical Exam ?Updated Vital Signs ?BP (!) 208/81   Pulse 75   Temp 98.3 ?F (36.8 ?C)   Resp 20   Ht '5\' 3"'  (1.6 m)   Wt 49.9 kg   LMP 09/23/2007   SpO2 99%   BMI 19.49 kg/m?  ?Physical Exam ?Vitals and nursing note reviewed.  ?Constitutional:   ?   General: She is in acute distress.  ?   Appearance: She is ill-appearing. She is not toxic-appearing or diaphoretic.  ?HENT:  ?   Head: Normocephalic and atraumatic.  ?Eyes:  ?   General: No scleral icterus. ?   Extraocular Movements: Extraocular movements intact.  ?   Pupils: Pupils are equal, round, and reactive to light.  ?Cardiovascular:  ?   Rate and Rhythm: Normal rate and regular rhythm.  ?   Pulses: Normal pulses.  ?   Heart sounds: Murmur heard.  ?Pulmonary:  ?   Effort: Pulmonary effort is normal. No respiratory distress.  ?   Breath sounds: Normal breath sounds.  ?Abdominal:  ?   General: There is no distension.  ?   Palpations: Abdomen is soft.  ?   Tenderness: There is no abdominal tenderness.  ?Musculoskeletal:     ?   General: Tenderness present.  ?   Cervical back: Neck supple. Tenderness present. No rigidity.  ?   Thoracic back: Bony tenderness present.  ?   Lumbar back: Bony tenderness present.  ?   Right lower leg: No edema.  ?   Left lower leg: No edema.  ?   Comments: Tenderness of C,T,L spine to palpation. No step off or deformity.   ?Skin: ?   General: Skin is warm and dry.  ?   Capillary Refill: Capillary refill takes less than 2 seconds.  ?   Findings: No rash.  ?Neurological:  ?   General: No focal deficit present.  ?   Mental Status: She is alert and oriented to person, place, and time. Mental status is at baseline.  ?Psychiatric:     ?   Mood and  Affect: Mood normal.     ?   Behavior: Behavior normal.     ?   Thought Content: Thought content normal.     ?   Judgment: Judgment normal.  ? ? ?ED Results / Procedures / Treatments   ?Labs ?(all labs ordered are listed, but only abnormal results are displayed) ?Labs Reviewed  ?LIPASE, BLOOD - Abnormal; Notable for the following components:  ?    Result Value  ? Lipase 58 (*)   ?  All other components within normal limits  ?COMPREHENSIVE METABOLIC PANEL - Abnormal; Notable for the following components:  ? Glucose, Bld 114 (*)   ? Total Protein 9.0 (*)   ? All other components within normal limits  ?CBC - Abnormal; Notable for the following components:  ? WBC 13.9 (*)   ? All other components within normal limits  ?URINALYSIS, ROUTINE W REFLEX MICROSCOPIC  ? ?EKG ?None ? ?Radiology ?CT Chest W Contrast ? ?Result Date: 06/26/2021 ?CLINICAL DATA:  Abnormal xray - lung nodule, >= 1 cm Lung nodule, > 51m. Back pain. EXAM: CT CHEST WITH CONTRAST TECHNIQUE: Multidetector CT imaging of the chest was performed during intravenous contrast administration. RADIATION DOSE REDUCTION: This exam was performed according to the departmental dose-optimization program which includes automated exposure control, adjustment of the mA and/or kV according to patient size and/or use of iterative reconstruction technique. CONTRAST:  712mOMNIPAQUE IOHEXOL 300 MG/ML  SOLN COMPARISON:  Most recent chest x-ray 04/15/2021. Chest CT 11/19/2020 FINDINGS: Cardiovascular: Heart is normal size. Aorta is normal caliber. Scattered aortic atherosclerosis. Mediastinum/Nodes: No mediastinal, hilar, or axillary adenopathy. Trachea and esophagus are unremarkable. Thyroid unremarkable. Lungs/Pleura: Suture line in the left lower lobe. Adjacent soft tissue decreased overall since prior study, now currently measuring maximally 1.6 x 1.0 cm on image 107 of series 7 compared with 3.7 x 1.9 cm previously. Nodule in the superior segment of the left lower lobe along  the fissure measures 5 mm on image 59. This is new since prior study. Posterior right upper lobe nodule measures approximately 7 mm in greatest diameter on image 59, new since prior study. No effusions. Upper Abdomen: Im

## 2021-06-28 NOTE — Discharge Instructions (Addendum)
You were seen in the emergency department for back pain and vomiting. ? ?I have sent you a prescription for a nausea medicine and some pain medicine to tide you over until you can see the spine specialist.  I have sent referrals to both pain management and the spine doctor.  I have also attached their contact information for you to call and make appointments if you have not heard from them by Tuesday. ?

## 2021-06-28 NOTE — ED Notes (Signed)
Patient given saltine crackers and gingerale ?

## 2021-06-28 NOTE — ED Notes (Signed)
Patient is requesting more pain medicine. Message sent to PA.  ?

## 2021-06-28 NOTE — ED Triage Notes (Signed)
Arrived by EMS. States she has chronic back pain that has been getting worse over the past 10 days. C/o N/V/D x 1 week.  ?

## 2021-06-28 NOTE — ED Notes (Signed)
Pt states will only accept attempt at IV access with u/s, labs collected in triage.  Pt unable to provide urine specimen at this time. ?

## 2021-06-29 LAB — ACID FAST CULTURE WITH REFLEXED SENSITIVITIES (MYCOBACTERIA): Acid Fast Culture: NEGATIVE

## 2021-06-30 ENCOUNTER — Telehealth: Payer: Self-pay | Admitting: Physician Assistant

## 2021-06-30 NOTE — Telephone Encounter (Signed)
Sch per 3/16 inbasket, unable to leave message , calendar mailed ?

## 2021-07-02 ENCOUNTER — Other Ambulatory Visit: Payer: Self-pay

## 2021-07-02 ENCOUNTER — Emergency Department (HOSPITAL_COMMUNITY)
Admission: EM | Admit: 2021-07-02 | Discharge: 2021-07-02 | Disposition: A | Discharge status: Home / Self Care | Payer: Medicaid Other | Attending: Emergency Medicine | Admitting: Emergency Medicine

## 2021-07-02 ENCOUNTER — Other Ambulatory Visit (HOSPITAL_COMMUNITY): Payer: Self-pay

## 2021-07-02 ENCOUNTER — Encounter (HOSPITAL_COMMUNITY): Payer: Self-pay

## 2021-07-02 DIAGNOSIS — M545 Low back pain, unspecified: Secondary | ICD-10-CM | POA: Diagnosis present

## 2021-07-02 DIAGNOSIS — M542 Cervicalgia: Secondary | ICD-10-CM | POA: Diagnosis not present

## 2021-07-02 DIAGNOSIS — Z7982 Long term (current) use of aspirin: Secondary | ICD-10-CM | POA: Insufficient documentation

## 2021-07-02 DIAGNOSIS — M546 Pain in thoracic spine: Secondary | ICD-10-CM | POA: Insufficient documentation

## 2021-07-02 DIAGNOSIS — R1110 Vomiting, unspecified: Secondary | ICD-10-CM | POA: Insufficient documentation

## 2021-07-02 DIAGNOSIS — G8929 Other chronic pain: Secondary | ICD-10-CM | POA: Insufficient documentation

## 2021-07-02 LAB — BASIC METABOLIC PANEL
Anion gap: 12 (ref 5–15)
BUN: 20 mg/dL (ref 6–20)
CO2: 25 mmol/L (ref 22–32)
Calcium: 10.1 mg/dL (ref 8.9–10.3)
Chloride: 103 mmol/L (ref 98–111)
Creatinine, Ser: 0.73 mg/dL (ref 0.44–1.00)
GFR, Estimated: >60 mL/min (ref >60)
Glucose, Bld: 140 mg/dL — ABNORMAL HIGH (ref 70–99)
Potassium: 4 mmol/L (ref 3.5–5.1)
Sodium: 140 mmol/L (ref 135–145)

## 2021-07-02 LAB — CBC WITH DIFFERENTIAL/PLATELET
Abs Immature Granulocytes: 0.03 10*3/uL (ref 0.00–0.07)
Basophils Absolute: 0.1 10*3/uL (ref 0.0–0.1)
Basophils Relative: 1 %
Eosinophils Absolute: 0.1 10*3/uL (ref 0.0–0.5)
Eosinophils Relative: 1 %
HCT: 42.6 % (ref 36.0–46.0)
Hemoglobin: 14.4 g/dL (ref 12.0–15.0)
Immature Granulocytes: 0 %
Lymphocytes Relative: 22 %
Lymphs Abs: 2.3 10*3/uL (ref 0.7–4.0)
MCH: 30.6 pg (ref 26.0–34.0)
MCHC: 33.8 g/dL (ref 30.0–36.0)
MCV: 90.4 fL (ref 80.0–100.0)
Monocytes Absolute: 0.3 10*3/uL (ref 0.1–1.0)
Monocytes Relative: 3 %
Neutro Abs: 7.7 10*3/uL (ref 1.7–7.7)
Neutrophils Relative %: 73 %
Platelets: 308 10*3/uL (ref 150–400)
RBC: 4.71 MIL/uL (ref 3.87–5.11)
RDW: 14.5 % (ref 11.5–15.5)
WBC: 10.4 10*3/uL (ref 4.0–10.5)
nRBC: 0 % (ref 0.0–0.2)

## 2021-07-02 MED ORDER — KETOROLAC TROMETHAMINE 15 MG/ML IJ SOLN
15.0000 mg | Freq: Once | INTRAMUSCULAR | Status: AC
  Administered 2021-07-02: 15 mg via INTRAVENOUS
  Filled 2021-07-02: qty 1

## 2021-07-02 MED ORDER — LIDOCAINE 5 % EX PTCH
1.0000 | MEDICATED_PATCH | CUTANEOUS | 0 refills | Status: AC
  Filled 2021-07-02 (×2): qty 30, 30d supply, fill #0

## 2021-07-02 MED ORDER — HYDROMORPHONE HCL 1 MG/ML IJ SOLN
0.5000 mg | Freq: Once | INTRAMUSCULAR | Status: AC
  Administered 2021-07-02: 0.5 mg via INTRAVENOUS
  Filled 2021-07-02: qty 1

## 2021-07-02 MED ORDER — ONDANSETRON HCL 4 MG/2ML IJ SOLN
4.0000 mg | Freq: Once | INTRAMUSCULAR | Status: AC
  Administered 2021-07-02: 4 mg via INTRAVENOUS
  Filled 2021-07-02: qty 2

## 2021-07-02 MED ORDER — HYDROCHLOROTHIAZIDE 12.5 MG PO TABS
12.5000 mg | ORAL_TABLET | Freq: Every day | ORAL | Status: DC
  Administered 2021-07-02: 12.5 mg via ORAL
  Filled 2021-07-02: qty 1

## 2021-07-02 MED ORDER — FENTANYL CITRATE PF 50 MCG/ML IJ SOSY
25.0000 ug | PREFILLED_SYRINGE | Freq: Once | INTRAMUSCULAR | Status: AC
  Administered 2021-07-02: 25 ug via INTRAVENOUS
  Filled 2021-07-02: qty 1

## 2021-07-02 MED ORDER — HYDROMORPHONE HCL 1 MG/ML IJ SOLN
0.5000 mg | Freq: Once | INTRAMUSCULAR | Status: AC
  Administered 2021-07-02: 0.5 mg via INTRAMUSCULAR
  Filled 2021-07-02: qty 1
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
## 2021-07-02 NOTE — ED Provider Notes (Signed)
?[facility] ?Provider Note ? ? ?CSN: [id number] ?Arrival date & time: 07/02/21  2751 ? ?  ? ?History ?Chief Complaint  ?Patient presents with  ? Back Pain  ? ? ?Brenda [name] is a 61 y.o. female with history of chronic back pain and remote history of IV drug abuse who presents to the emergency department with chronic back pain that acutely got worse over the last day as she ran out of her Vicodin.  Patient has been seen and evaluated 5 times this month for similar symptoms.  She had a normal MRI performed on 06/23/2021 of the thoracic and lumbar spine which did not show any evidence of epidural abscess, discitis, or osteomyelitis.  At that time she was referred to chronic pain management.  Patient states she called them yesterday and was told that she was unable to be seen as she did not have "clinical findings."  ? ?Today, patient complains of pain along the spine.  No new trauma or injury to the back.  No weakness or numbness to the legs.  No bowel or bladder incontinence.  Patient states she last use IV drugs in December.  No cocaine use recently.  She last used marijuana yesterday.  Patient states that the pain is so severe that it makes her vomit.  She was up all night dry heaving and vomiting.  No evidence of blood.  No abdominal pain. ? ? ?Back Pain ? ?  ? ?Home Medications ?Prior to Admission medications   ?Medication Sig Start Date End Date Taking? Authorizing Provider  ?lidocaine (LIDODERM) 5 % Place 1 patch onto the skin daily. Remove & Discard patch within 12 hours or as directed by MD 07/02/21  Yes Raul Del, Sanjay Broadfoot M, PA-C  ?amLODipine (NORVASC) 10 MG tablet Take 1 tablet (10 mg total) by mouth daily. 11/27/20   Elgergawy, Silver Huguenin, MD  ?aspirin EC 81 MG tablet Take 1 tablet (81 mg total) by mouth daily. ?Patient not taking: Reported on 04/16/2021 11/26/20   Elgergawy, Silver Huguenin, MD  ?atorvastatin (LIPITOR) 20 MG tablet Take 1 tablet (20 mg total) by mouth daily. 05/21/21    Farrel Gordon, DO  ?celecoxib (CELEBREX) 200 MG capsule Take 1 capsule (200 mg total) by mouth 2 (two) times daily. 05/20/21   Farrel Gordon, DO  ?feeding supplement (ENSURE ENLIVE / ENSURE PLUS) LIQD Take 237 mLs by mouth 3 (three) times daily between meals. 11/26/20   Elgergawy, Silver Huguenin, MD  ?ferrous sulfate 325 (65 FE) MG tablet Take 1 tablet (325 mg total) by mouth daily with breakfast. 05/21/21   Farrel Gordon, DO  ?fluconazole (DIFLUCAN) 150 MG tablet Take 1 tablet (150 mg total) by mouth daily. Once and can repeat every other day if yeast infection still present 06/03/21   Carlyle Basques, MD  ?hydrochlorothiazide (HYDRODIURIL) 12.5 MG tablet Take 1/2 tablets (6.25 mg total) by mouth daily. 05/21/21   Farrel Gordon, DO  ?HYDROcodone-acetaminophen (NORCO/VICODIN) 5-325 MG tablet Take 2 tablets by mouth every 4 (four) hours as needed. 06/28/21   Roemhildt, Lorin T, PA-C  ?hydrOXYzine (ATARAX) 25 MG tablet Take 2 tablets (50 mg total) by mouth 3 (three) times daily. 05/20/21   Farrel Gordon, DO  ?metoCLOPramide (REGLAN) 10 MG tablet Take 1 tablet (10 mg total) by mouth every 6 (six) hours. 06/28/21   Roemhildt, Lorin T, PA-C  ?Multiple Vitamin (MULTIVITAMIN WITH MINERALS) TABS tablet Take 1 tablet by mouth daily. 11/27/20   Elgergawy, Silver Huguenin, MD  ?ondansetron (  ZOFRAN) 8 MG tablet Take 1 tablet (8 mg total) by mouth every 8 (eight) hours as needed for nausea or vomiting. 06/23/21   Daleen Bo, MD  ?pregabalin (LYRICA) 50 MG capsule Take 1 capsule (50 mg total) by mouth 3 (three) times daily. 05/20/21   Farrel Gordon, DO  ?Vitamin D3 (VITAMIN D) 25 MCG tablet Take 2 tablets (2,000 Units total) by mouth daily. 05/21/21   Farrel Gordon, DO  ?   ? ?Allergies    ?Patient has no known allergies.   ? ?Review of Systems   ?Review of Systems  ?Musculoskeletal:  Positive for back pain.  ?All other systems reviewed and are negative. ? ?Physical Exam ?Updated Vital Signs ?BP (!) 165/127   Pulse 64   Temp 98.6 ?F (37 ?C)   Resp 15   Ht 5\' 3"  (1.6  m)   Wt 49.9 kg   LMP 09/23/2007   SpO2 99%   BMI 19.49 kg/m?  ?Physical Exam ?Vitals and nursing note reviewed.  ?Constitutional:   ?   Appearance: Normal appearance.  ?HENT:  ?   Head: Normocephalic and atraumatic.  ?Eyes:  ?   General:     ?   Right eye: No discharge.     ?   Left eye: No discharge.  ?   Conjunctiva/sclera: Conjunctivae normal.  ?Pulmonary:  ?   Effort: Pulmonary effort is normal.  ?Musculoskeletal:  ?   Comments: Full range of motion in the hips and lower extremities.  She has 5/5 strength to the lower extremities and upper extremities.  Normal sensation to the upper and lower extremities.  There is tenderness diffusely over the cervical and thoracic spine.  Lumbar spine is nontender to palpation.  No obvious step-offs.  ?Skin: ?   General: Skin is warm and dry.  ?   Findings: No rash.  ?Neurological:  ?   General: No focal deficit present.  ?   Mental Status: She is alert.  ?Psychiatric:     ?   Mood and Affect: Mood normal.     ?   Behavior: Behavior normal.  ? ? ?ED Results / Procedures / Treatments   ?Labs ?(all labs ordered are listed, but only abnormal results are displayed) ?Labs Reviewed  ?BASIC METABOLIC PANEL - Abnormal; Notable for the following components:  ?    Result Value  ? Glucose, Bld 140 (*)   ? All other components within normal limits  ?CBC WITH DIFFERENTIAL/PLATELET  ? ? ?EKG ?EKG Interpretation ? ?Date/Time:  Wednesday July 02 2021 09:41:43 EDT ?Ventricular Rate:  68 ?PR Interval:  125 ?QRS Duration: 84 ?QT Interval:  477 ?QTC Calculation: 508 ?R Axis:   76 ?Text Interpretation: Sinus rhythm LAE, consider biatrial enlargement Left ventricular hypertrophy Nonspecific T abnrm, anterolateral leads Borderline prolonged QT interval No significant change since last tracing Confirmed by Blanchie Dessert [phone]) on 07/02/2021 10:02:19 AM ? ?Radiology ?No results found. ? ?Procedures ?Procedures  ? ? ?Medications Ordered in ED ?Medications  ?hydrochlorothiazide (HYDRODIURIL)  tablet 12.5 mg (12.5 mg Oral Given 07/02/21 1214)  ?HYDROmorphone (DILAUDID) injection 0.5 mg (has no administration in time range)  ?ketorolac (TORADOL) 15 MG/ML injection 15 mg (15 mg Intravenous Given 07/02/21 0936)  ?ondansetron [facility]) injection 4 mg (4 mg Intravenous Given 07/02/21 0935)  ?fentaNYL (SUBLIMAZE) injection 25 mcg (25 mcg Intravenous Given 07/02/21 1036)  ?HYDROmorphone (DILAUDID) injection 0.5 mg (0.5 mg Intravenous Given 07/02/21 1124)  ? ? ?ED Course/ Medical Decision Making/ A&P ?Clinical Course as  of 07/02/21 1329  ?Wed Jul 02, 2021  ?1126 Patient's blood pressure noted to be elevated.  Patient is completely asymptomatic.  She does state that she is supposed to be on hydrochlorothiazide however she is does not take it.  Stated this is likely hypertensive urgency I will give her her home medications in the department today. [CF]  ?1325 On reevaluation, patient states her pain is improved with 1/2 mg of Dilaudid.  I will give her another half milligram and plan to discharge her home.  I reviewed PDMP and it looks like that she had narcotic pain medication filled on 06/28/2021 for 30-day supply.  I will not refill her narcotic pain medication until she follows up with chronic pain management. [CF]  ?  ?Clinical Course User Index ?[CF] Myna Bright M, PA-C  ? ?                        ?Medical Decision Making ?Amount and/or Complexity of Data Reviewed ?Labs: ordered. ? ?Risk ?Prescription drug management. ? ? ?This patient presents to the ED for concern of chronic back pain, this involves an extensive number of treatment options, and is a complaint that carries with it a high risk of complications and morbidity.  The differential diagnosis includes exacerbation of her chronic pain.  I have a low suspicion for acute spinal infection at this time.  Patient is not tachycardic nor is she having any symptoms apart from pain.  No obvious neurological deficits.  No evidence of IV drug use.  I doubt any  fracture or dislocation at this time.  I doubt cauda equina. ? ? ?Co morbidities that complicate the patient evaluation ? ?Chronic pain ? ? ?Additional history obtained: ? ?Additional history obtained from old r

## 2021-07-02 NOTE — Discharge Instructions (Addendum)
Please use lidocaine patches over the painful area as needed for pain.  I am unable to refill you on narcotic pain medication at this time until you follow-up with chronic pain management.  Please return to the emergency department for any worsening symptoms. ?

## 2021-07-03 ENCOUNTER — Telehealth: Payer: Self-pay | Admitting: Physician Assistant

## 2021-07-03 NOTE — Telephone Encounter (Signed)
We had ordered a repeat CT scan to follow up on her hx od lung cancer. She has been lost to follow up. However, a CT chest was performed on 06/26/21. Therefore, I have called radiology and cancelled the scan that was scheduled for 07/17/21. I have left a detailed voicemail with the patient that she does NOT need a scan and just needs to come in for her appointment on 07/17/21 at the cancer center to review the results and discuss next steps. The patient is very difficult to get in touch with. I would normally recommend moving her appointment earlier to next week but that may cause additional confusion if we are unable to reach her by then. I left our call back number if she has questions or if she would like to reschedule her appointment sooner.  ?

## 2021-07-04 ENCOUNTER — Emergency Department (HOSPITAL_COMMUNITY)
Admission: EM | Admit: 2021-07-04 | Discharge: 2021-07-04 | Disposition: A | Discharge status: Home / Self Care | Payer: Medicaid Other | Attending: Emergency Medicine | Admitting: Emergency Medicine

## 2021-07-04 DIAGNOSIS — I10 Essential (primary) hypertension: Secondary | ICD-10-CM | POA: Diagnosis not present

## 2021-07-04 DIAGNOSIS — R1110 Vomiting, unspecified: Secondary | ICD-10-CM | POA: Insufficient documentation

## 2021-07-04 DIAGNOSIS — G8929 Other chronic pain: Secondary | ICD-10-CM | POA: Insufficient documentation

## 2021-07-04 LAB — URINALYSIS, ROUTINE W REFLEX MICROSCOPIC
Bacteria, UA: NONE SEEN
Bilirubin Urine: NEGATIVE
Glucose, UA: NEGATIVE mg/dL
Ketones, ur: NEGATIVE mg/dL
Leukocytes,Ua: NEGATIVE
Nitrite: NEGATIVE
Protein, ur: >=300 mg/dL — AB
Specific Gravity, Urine: 1.026 (ref 1.005–1.030)
pH: 5 (ref 5.0–8.0)

## 2021-07-04 LAB — CBC
HCT: 46 % (ref 36.0–46.0)
Hemoglobin: 15.2 g/dL — ABNORMAL HIGH (ref 12.0–15.0)
MCH: 30 pg (ref 26.0–34.0)
MCHC: 33 g/dL (ref 30.0–36.0)
MCV: 90.9 fL (ref 80.0–100.0)
Platelets: 365 10*3/uL (ref 150–400)
RBC: 5.06 MIL/uL (ref 3.87–5.11)
RDW: 14.4 % (ref 11.5–15.5)
WBC: 14.3 10*3/uL — ABNORMAL HIGH (ref 4.0–10.5)
nRBC: 0 % (ref 0.0–0.2)

## 2021-07-04 LAB — COMPREHENSIVE METABOLIC PANEL
ALT: 13 U/L (ref 0–44)
AST: 20 U/L (ref 15–41)
Albumin: 4.6 g/dL (ref 3.5–5.0)
Alkaline Phosphatase: 72 U/L (ref 38–126)
Anion gap: 12 (ref 5–15)
BUN: 16 mg/dL (ref 6–20)
CO2: 21 mmol/L — ABNORMAL LOW (ref 22–32)
Calcium: 10.3 mg/dL (ref 8.9–10.3)
Chloride: 104 mmol/L (ref 98–111)
Creatinine, Ser: 0.76 mg/dL (ref 0.44–1.00)
GFR, Estimated: >60 mL/min (ref >60)
Glucose, Bld: 145 mg/dL — ABNORMAL HIGH (ref 70–99)
Potassium: 3.6 mmol/L (ref 3.5–5.1)
Sodium: 137 mmol/L (ref 135–145)
Total Bilirubin: 0.5 mg/dL (ref 0.3–1.2)
Total Protein: 9.1 g/dL — ABNORMAL HIGH (ref 6.5–8.1)

## 2021-07-04 LAB — I-STAT BETA HCG BLOOD, ED (MC, WL, AP ONLY): I-stat hCG, quantitative: <5 m[IU]/mL (ref <5)

## 2021-07-04 LAB — LIPASE, BLOOD: Lipase: 56 U/L — ABNORMAL HIGH (ref 11–51)

## 2021-07-04 MED ORDER — KETOROLAC TROMETHAMINE 30 MG/ML IJ SOLN
30.0000 mg | Freq: Once | INTRAMUSCULAR | Status: AC
Start: 2021-07-04 — End: 2021-07-04
  Administered 2021-07-04: 30 mg via INTRAMUSCULAR
  Filled 2021-07-04: qty 1

## 2021-07-04 MED ORDER — LORAZEPAM 1 MG PO TABS
1.0000 mg | ORAL_TABLET | Freq: Once | ORAL | Status: AC
Start: 2021-07-04 — End: 2021-07-04
  Administered 2021-07-04: 1 mg via ORAL
  Filled 2021-07-04: qty 1

## 2021-07-04 NOTE — ED Provider Notes (Signed)
?[facility] DEPT ?[facility] Emergency Department ?Provider Note ?MRN:  [id number]  ?Arrival date & time: 07/04/21    ? ?Chief Complaint   ?Emesis ?  ?History of Present Illness   ?Brenda [name] is a 61 y.o. year-old female presents to the ED with chief complaint of chronic pain with associated hypertension and vomiting.  She states that she does not have a PCP.  She was seen here recently for similar.  States she is out of her pain medication.  She has been referred to pain management.  She denies any new changes in her condition. ? ?History provided by patient. ? ? ?Review of Systems  ?Pertinent review of systems noted in HPI.  ? ? ?Physical Exam  ? ?Vitals:  ? 07/04/21 0212  ?BP: (!) 212/110  ?Pulse: 94  ?Resp: 17  ?Temp: 98.1 ?F (36.7 ?C)  ?SpO2: 98%  ?  ?CONSTITUTIONAL:  nontoxic-appearing, NAD ?NEURO:  Alert and oriented x 3, CN 3-12 grossly intact ?EYES:  eyes equal and reactive ?ENT/NECK:  Supple, no stridor  ?CARDIO:  normal rate, regular rhythm, appears well-perfused  ?PULM:  No respiratory distress, CTAB ?GI/GU:  non-distended,  ?MSK/SPINE:  No gross deformities, no edema, moves all extremities  ?SKIN:  no rash, atraumatic ? ? ?*Additional and/or pertinent findings included in MDM below ? ?Diagnostic and Interventional Summary  ? ? ?Labs Reviewed  ?LIPASE, BLOOD - Abnormal; Notable for the following components:  ?    Result Value  ? Lipase 56 (*)   ? All other components within normal limits  ?COMPREHENSIVE METABOLIC PANEL - Abnormal; Notable for the following components:  ? CO2 21 (*)   ? Glucose, Bld 145 (*)   ? Total Protein 9.1 (*)   ? All other components within normal limits  ?CBC - Abnormal; Notable for the following components:  ? WBC 14.3 (*)   ? Hemoglobin 15.2 (*)   ? All other components within normal limits  ?URINALYSIS, ROUTINE W REFLEX MICROSCOPIC - Abnormal; Notable for the following components:  ? APPearance HAZY (*)   ? Hgb urine dipstick SMALL (*)   ? Protein, ur >=300 (*)    ? All other components within normal limits  ?I-STAT BETA HCG BLOOD, ED ([facility], [facility], [facility] ONLY)  ?  ?No orders to display  ?  ?Medications  ?ketorolac (TORADOL) 30 MG/ML injection 30 mg (has no administration in time range)  ?LORazepam (ATIVAN) tablet 1 mg (has no administration in time range)  ?  ? ?Procedures  /  Critical Care ?Procedures ? ?ED Course and Medical Decision Making  ?I have reviewed the triage vital signs, the nursing notes, and pertinent available records from the EMR. ? ?Complexity of Problems Addressed: ?Moderate Complexity: Chronic illness with exacerbation (poorly controlled chronic illness, but not requiring further diagnostics or treatment in ED) ?Comorbidities affecting this illness/injury include: ?Chronic pain ?Social Determinants Affecting Care: ?Complexity of care is increased due to drug abuse/addiction. ? ? ?ED Course: ?After considering the following differential, chronic pain, substance abuse, I ordered toradol IM for pain and ativan to help with nausea. ? ? ?  ? ?Consultants: ?No consultations were needed in caring for this patient. ? ?Treatment and Plan: ?Addressed pain with toradol IM.  Encouraged patient to follow-up for outpatient treatment of chronic pain. ? ?Emergency department workup does not suggest an emergent condition requiring admission or immediate intervention beyond  what has been performed at this time. The patient is safe for discharge and has  been instructed  to return immediately for worsening symptoms, change in  symptoms or any other concerns ? ? ? ?Final Clinical Impressions(s) / ED Diagnoses  ? ?  ICD-10-CM   ?1. Other chronic pain  G89.29   ?  ?  ?ED Discharge Orders   ? ? None  ? ?  ?  ? ? ?Discharge Instructions Discussed with and Provided to Patient:  ? ?Discharge Instructions   ?None ?  ? ?  ?Montine Circle, PA-C ?07/04/21 8657 ? ?  ?Orpah Greek, MD ?07/04/21 [phone] ? ?

## 2021-07-04 NOTE — ED Triage Notes (Signed)
Pt arrives to ED BIB [facility] c/o Emesis and is here because she wants help getting to her pain control clinic. States she ran out of pain meds. ? ?BP 230/140 ?HR 85 ?O2 97% RA ?

## 2021-07-07 ENCOUNTER — Other Ambulatory Visit (HOSPITAL_COMMUNITY): Payer: Self-pay

## 2021-07-07 MED ORDER — ONDANSETRON 4 MG PO TBDP
4.0000 mg | ORAL_TABLET | Freq: Three times a day (TID) | ORAL | 0 refills | Status: AC | PRN
  Filled 2021-07-07: qty 20, 7d supply, fill #0

## 2021-07-08 ENCOUNTER — Other Ambulatory Visit: Payer: Self-pay

## 2021-07-08 ENCOUNTER — Encounter (HOSPITAL_COMMUNITY): Payer: Self-pay

## 2021-07-08 ENCOUNTER — Emergency Department (HOSPITAL_COMMUNITY)
Admission: EM | Admit: 2021-07-08 | Discharge: 2021-07-09 | Disposition: A | Discharge status: Home / Self Care | Payer: Medicaid Other | Attending: Emergency Medicine | Admitting: Emergency Medicine

## 2021-07-08 DIAGNOSIS — R410 Disorientation, unspecified: Secondary | ICD-10-CM | POA: Diagnosis not present

## 2021-07-08 DIAGNOSIS — F329 Major depressive disorder, single episode, unspecified: Secondary | ICD-10-CM | POA: Diagnosis not present

## 2021-07-08 DIAGNOSIS — I10 Essential (primary) hypertension: Secondary | ICD-10-CM | POA: Diagnosis not present

## 2021-07-08 DIAGNOSIS — Z7982 Long term (current) use of aspirin: Secondary | ICD-10-CM | POA: Insufficient documentation

## 2021-07-08 DIAGNOSIS — G894 Chronic pain syndrome: Secondary | ICD-10-CM | POA: Insufficient documentation

## 2021-07-08 DIAGNOSIS — Z79899 Other long term (current) drug therapy: Secondary | ICD-10-CM | POA: Insufficient documentation

## 2021-07-08 LAB — CBC WITH DIFFERENTIAL/PLATELET
Abs Immature Granulocytes: 0.06 10*3/uL (ref 0.00–0.07)
Basophils Absolute: 0.1 10*3/uL (ref 0.0–0.1)
Basophils Relative: 0 %
Eosinophils Absolute: 0.1 10*3/uL (ref 0.0–0.5)
Eosinophils Relative: 1 %
HCT: 42.6 % (ref 36.0–46.0)
Hemoglobin: 14.6 g/dL (ref 12.0–15.0)
Immature Granulocytes: 0 %
Lymphocytes Relative: 16 %
Lymphs Abs: 2.6 10*3/uL (ref 0.7–4.0)
MCH: 30.8 pg (ref 26.0–34.0)
MCHC: 34.3 g/dL (ref 30.0–36.0)
MCV: 89.9 fL (ref 80.0–100.0)
Monocytes Absolute: 1.1 10*3/uL — ABNORMAL HIGH (ref 0.1–1.0)
Monocytes Relative: 7 %
Neutro Abs: 12.6 10*3/uL — ABNORMAL HIGH (ref 1.7–7.7)
Neutrophils Relative %: 76 %
Platelets: 298 10*3/uL (ref 150–400)
RBC: 4.74 MIL/uL (ref 3.87–5.11)
RDW: 14 % (ref 11.5–15.5)
WBC: 16.4 10*3/uL — ABNORMAL HIGH (ref 4.0–10.5)
nRBC: 0 % (ref 0.0–0.2)

## 2021-07-08 LAB — COMPREHENSIVE METABOLIC PANEL
ALT: 17 U/L (ref 0–44)
AST: 26 U/L (ref 15–41)
Albumin: 4.9 g/dL (ref 3.5–5.0)
Alkaline Phosphatase: 83 U/L (ref 38–126)
Anion gap: 8 (ref 5–15)
BUN: 35 mg/dL — ABNORMAL HIGH (ref 6–20)
CO2: 21 mmol/L — ABNORMAL LOW (ref 22–32)
Calcium: 9.2 mg/dL (ref 8.9–10.3)
Chloride: 102 mmol/L (ref 98–111)
Creatinine, Ser: 1.49 mg/dL — ABNORMAL HIGH (ref 0.44–1.00)
GFR, Estimated: 40 mL/min — ABNORMAL LOW (ref >60)
Glucose, Bld: 129 mg/dL — ABNORMAL HIGH (ref 70–99)
Potassium: 3.4 mmol/L — ABNORMAL LOW (ref 3.5–5.1)
Sodium: 131 mmol/L — ABNORMAL LOW (ref 135–145)
Total Bilirubin: 0.6 mg/dL (ref 0.3–1.2)
Total Protein: 9.3 g/dL — ABNORMAL HIGH (ref 6.5–8.1)

## 2021-07-08 LAB — RAPID URINE DRUG SCREEN, HOSP PERFORMED
Amphetamines: NOT DETECTED
Barbiturates: NOT DETECTED
Benzodiazepines: NOT DETECTED
Cocaine: POSITIVE — AB
Opiates: NOT DETECTED
Tetrahydrocannabinol: NOT DETECTED

## 2021-07-08 LAB — ETHANOL: Alcohol, Ethyl (B): <10 mg/dL (ref <10)

## 2021-07-08 LAB — ACETAMINOPHEN LEVEL: Acetaminophen (Tylenol), Serum: <10 ug/mL — ABNORMAL LOW (ref 10–30)

## 2021-07-08 LAB — SALICYLATE LEVEL: Salicylate Lvl: <7 mg/dL — ABNORMAL LOW (ref 7.0–30.0)

## 2021-07-08 MED ORDER — SODIUM CHLORIDE 0.9 % IV BOLUS
1000.0000 mL | Freq: Once | INTRAVENOUS | Status: AC
  Administered 2021-07-08: 1000 mL via INTRAVENOUS

## 2021-07-08 MED ORDER — KETOROLAC TROMETHAMINE 30 MG/ML IJ SOLN
30.0000 mg | Freq: Once | INTRAMUSCULAR | Status: AC
  Administered 2021-07-08: 30 mg via INTRAVENOUS
  Filled 2021-07-08: qty 1

## 2021-07-08 MED ORDER — ONDANSETRON HCL 4 MG/2ML IJ SOLN
4.0000 mg | Freq: Once | INTRAMUSCULAR | Status: AC
  Administered 2021-07-08: 4 mg via INTRAVENOUS
  Filled 2021-07-08: qty 2

## 2021-07-08 MED ORDER — HYDRALAZINE HCL 20 MG/ML IJ SOLN
10.0000 mg | Freq: Once | INTRAMUSCULAR | Status: AC
  Administered 2021-07-08: 10 mg via INTRAVENOUS
  Filled 2021-07-08: qty 1

## 2021-07-08 NOTE — ED Provider Notes (Signed)
?[facility] DEPT ?Provider Note ? ? ?CSN: [id number] ?Arrival date & time: 07/08/21  2036 ? ?  ? ?History ? ?No chief complaint on file. ? ? ?Brenda [name] is a 61 y.o. female history of chronic pain pain medicine, hypertension, who presented with possible overdose.  Patient was found in the bathroom with a needle in her hand.  She states that she hates heroin and she adamantly denies overdosing on heroin.  She states that she does not know how she will get her drugs.  She states that she had used heroin in the past and her friends are not giving her heroin anymore.  She does use chronic pain medicine and is on Vicodin at baseline.  Patient has been seen multiple times this month in the ED for back pain.  Patient does have a pain contract with her doctor.  She states that she ran out of her Vicodin this month even though she just filled her prescription 10 days ago.  ? ?The history is provided by the patient.  ? ?  ? ?Home Medications ?Prior to Admission medications   ?Medication Sig Start Date End Date Taking? Authorizing Provider  ?amLODipine (NORVASC) 10 MG tablet Take 1 tablet (10 mg total) by mouth daily. 11/27/20   Elgergawy, Silver Huguenin, MD  ?aspirin EC 81 MG tablet Take 1 tablet (81 mg total) by mouth daily. ?Patient not taking: Reported on 04/16/2021 11/26/20   Elgergawy, Silver Huguenin, MD  ?atorvastatin (LIPITOR) 20 MG tablet Take 1 tablet (20 mg total) by mouth daily. 05/21/21   Farrel Gordon, DO  ?celecoxib (CELEBREX) 200 MG capsule Take 1 capsule (200 mg total) by mouth 2 (two) times daily. 05/20/21   Farrel Gordon, DO  ?feeding supplement (ENSURE ENLIVE / ENSURE PLUS) LIQD Take 237 mLs by mouth 3 (three) times daily between meals. 11/26/20   Elgergawy, Silver Huguenin, MD  ?ferrous sulfate 325 (65 FE) MG tablet Take 1 tablet (325 mg total) by mouth daily with breakfast. 05/21/21   Farrel Gordon, DO  ?fluconazole (DIFLUCAN) 150 MG tablet Take 1 tablet (150 mg total) by mouth daily. Once and can repeat  every other day if yeast infection still present 06/03/21   Carlyle Basques, MD  ?hydrochlorothiazide (HYDRODIURIL) 12.5 MG tablet Take 1/2 tablets (6.25 mg total) by mouth daily. 05/21/21   Farrel Gordon, DO  ?HYDROcodone-acetaminophen (NORCO/VICODIN) 5-325 MG tablet Take 2 tablets by mouth every 4 (four) hours as needed. 06/28/21   Roemhildt, Lorin T, PA-C  ?hydrOXYzine (ATARAX) 25 MG tablet Take 2 tablets (50 mg total) by mouth 3 (three) times daily. 05/20/21   Farrel Gordon, DO  ?lidocaine (LIDODERM) 5 % Place 1 patch onto the skin daily. Remove & Discard patch within 12 hours or as directed by doctor 07/02/21   Hendricks Limes, PA-C  ?metoCLOPramide (REGLAN) 10 MG tablet Take 1 tablet (10 mg total) by mouth every 6 (six) hours. 06/28/21   Roemhildt, Lorin T, PA-C  ?Multiple Vitamin (MULTIVITAMIN WITH MINERALS) TABS tablet Take 1 tablet by mouth daily. 11/27/20   Elgergawy, Silver Huguenin, MD  ?ondansetron (ZOFRAN) 8 MG tablet Take 1 tablet (8 mg total) by mouth every 8 (eight) hours as needed for nausea or vomiting. 06/23/21   Daleen Bo, MD  ?ondansetron (ZOFRAN-ODT) 4 MG disintegrating tablet Dissolve  1 tablet (4 mg total) in mouth every 8 (eight) hours as needed for nausea 07/06/21     ?pregabalin (LYRICA) 50 MG capsule Take 1 capsule (50 mg total)  by mouth 3 (three) times daily. 05/20/21   Farrel Gordon, DO  ?Vitamin D3 (VITAMIN D) 25 MCG tablet Take 2 tablets (2,000 Units total) by mouth daily. 05/21/21   Farrel Gordon, DO  ?   ? ?Allergies    ?Patient has no known allergies.   ? ?Review of Systems   ?Review of Systems  ?Psychiatric/Behavioral:  Positive for confusion.   ?All other systems reviewed and are negative. ? ?Physical Exam ?Updated Vital Signs ?BP (!) 204/102   Pulse 91   Temp 97.9 ?F (36.6 ?C) (Oral)   Resp 16   Ht 5\' 3"  (1.6 m)   Wt 49.9 kg   LMP 09/23/2007   SpO2 100%   BMI 19.49 kg/m?  ?Physical Exam ?Vitals and nursing note reviewed.  ?Constitutional:   ?   Comments: Slightly sleepy  ?HENT:  ?   Head:  Normocephalic.  ?   Nose: Nose normal.  ?Eyes:  ?   Comments: Pupils are pinpoint   ?Cardiovascular:  ?   Rate and Rhythm: Normal rate and regular rhythm.  ?   Pulses: Normal pulses.  ?Pulmonary:  ?   Effort: Pulmonary effort is normal.  ?   Breath sounds: Normal breath sounds.  ?Abdominal:  ?   General: Abdomen is flat.  ?   Palpations: Abdomen is soft.  ?Musculoskeletal:     ?   General: Normal range of motion.  ?   Cervical back: Normal range of motion and neck supple.  ?Skin: ?   General: Skin is warm.  ?   Capillary Refill: Capillary refill takes less than 2 seconds.  ?Neurological:  ?   General: No focal deficit present.  ?   Comments: Slightly confused, however patient is moving all extremities.  No focal deficits  ?Psychiatric:  ?   Comments: Slightly depressed, not suicidal   ? ? ?ED Results / Procedures / Treatments   ?Labs ?(all labs ordered are listed, but only abnormal results are displayed) ?Labs Reviewed  ?CBC WITH DIFFERENTIAL/PLATELET - Abnormal; Notable for the following components:  ?    Result Value  ? WBC 16.4 (*)   ? Neutro Abs 12.6 (*)   ? Monocytes Absolute 1.1 (*)   ? All other components within normal limits  ?COMPREHENSIVE METABOLIC PANEL - Abnormal; Notable for the following components:  ? Sodium 131 (*)   ? Potassium 3.4 (*)   ? CO2 21 (*)   ? Glucose, Bld 129 (*)   ? BUN 35 (*)   ? Creatinine, Ser 1.49 (*)   ? Total Protein 9.3 (*)   ? GFR, Estimated 40 (*)   ? All other components within normal limits  ?SALICYLATE LEVEL - Abnormal; Notable for the following components:  ? Salicylate Lvl <0.0 (*)   ? All other components within normal limits  ?ACETAMINOPHEN LEVEL - Abnormal; Notable for the following components:  ? Acetaminophen (Tylenol), Serum <10 (*)   ? All other components within normal limits  ?ETHANOL  ?RAPID URINE DRUG SCREEN, HOSP PERFORMED  ? ? ?EKG ?None ? ?Radiology ?No results found. ? ?Procedures ?Procedures  ? ? ?Angiocath insertion ?Performed by: [name] ? ?Consent:  Verbal consent obtained. ?Risks and benefits: risks, benefits and alternatives were discussed ?Time out: Immediately prior to procedure a "time out" was called to verify the correct patient, procedure, equipment, support staff and site/side marked as required. ? ?Preparation: Patient was prepped and draped in the usual sterile fashion. ? ?Vein Location: R brachial ? ?  Ultrasound Guided ? ?Gauge: 20 long  ? ?Normal blood return and flush without difficulty ?Patient tolerance: Patient tolerated the procedure well with no immediate complications. ? ? ? ?Medications Ordered in ED ?Medications  ?sodium chloride 0.9 % bolus 1,000 mL (1,000 mLs Intravenous New Bag/Given 07/08/21 2122)  ?ondansetron [facility]) injection 4 mg (4 mg Intravenous Given 07/08/21 2127)  ?hydrALAZINE (APRESOLINE) injection 10 mg (10 mg Intravenous Given 07/08/21 2127)  ? ? ?ED Course/ Medical Decision Making/ A&P ?  ?                        ?Medical Decision Making ?Brenda [name] is a 61 y.o. female here presenting with possible overdose.  Patient is confused and had a needle in her hand.  She states that she would not inject any heroin.  However she has pinpoint pupils.  She was given Narcan prior to arrival.  Concerned that she may have overdosed on heroin.  We will check CBC and CMP and alcohol level UDS.  Patient is hypertensive so we will give BP meds.  Will reassess ? ?11:21 PM ?Patient's creatinine is 1.5 and baseline is about 0.6.  Patient was given IV fluids.  UDS is positive for cocaine.  Blood cell count is 16 which is likely from stress reaction.  She does not have any heroin in her system.  Patient is ambulatory now.  She feels well enough to go home.  At this point, she is stable for discharge.  I told her that she needs to talk to her doctor regarding her pain management. ? ?Problems Addressed: ?Chronic pain syndrome: chronic illness or injury ?Confusion: acute illness or injury ? ?Amount and/or Complexity of Data Reviewed ?Labs:  ordered. Decision-making details documented in ED Course. ?ECG/medicine tests: ordered and independent interpretation performed. Decision-making details documented in ED Course. ? ?Risk ?Prescription drug management. ? ?

## 2021-07-08 NOTE — ED Notes (Signed)
Pre hospital vitals: BP 204/106, HR 110, 100% on room air, CBG 198. Unable to chart in pre-hospital section due to computer error. I mg narcan given on scene, intranasal.  ?

## 2021-07-08 NOTE — Discharge Instructions (Signed)
You have an episode of confusion I am not clear what triggered it.  You do have some cocaine in your system.  Otherwise your work-up is essentially normal. ? ?You can talk to your pain management doctor about how to manage her pain ? ?See your primary care doctor for follow up ? ?Return to ER if you have worse confusion, severe pain, unable to walk ?

## 2021-07-08 NOTE — ED Notes (Signed)
Called patients friend Jenny Reichmann at [phone] at patients request. Stated patient needed to be ready in 10 minutes. Patient was dressed with IV removed. Patient is waiting for friend in the lobby. Patient was able to ambulate to the lobby.  ?

## 2021-07-08 NOTE — ED Triage Notes (Signed)
EMS called out for overdose by family. When EMS arrived patient was locked in the bathroom. Patient was found unresponsive but breathing in bathtub, with pin point pupil; and needle in hand. Patient responded to painful stimuli with intermittent period of apnea. Patient was given 1mg  of narcan on scene. Patient admits to snorting cocaine but does not admit to heroin use. History of opioid and heroin addiction. Patient wear patch on left arm for opioids.  ?

## 2021-07-08 NOTE — ED Notes (Signed)
Patient was able to ambulate to the bathroom for urine sample.  ?
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
## 2021-07-17 ENCOUNTER — Other Ambulatory Visit (HOSPITAL_COMMUNITY): Payer: Medicaid Other

## 2021-07-17 ENCOUNTER — Inpatient Hospital Stay: Payer: Medicaid Other | Attending: Physician Assistant | Admitting: Physician Assistant

## 2021-07-17 ENCOUNTER — Inpatient Hospital Stay: Payer: Medicaid Other

## 2021-07-17 ENCOUNTER — Other Ambulatory Visit: Payer: Self-pay

## 2021-07-17 VITALS — BP 174/81 | HR 76 | Temp 97.8°F | Resp 18 | Ht 63.0 in | Wt 97.1 lb

## 2021-07-17 DIAGNOSIS — Z85118 Personal history of other malignant neoplasm of bronchus and lung: Secondary | ICD-10-CM | POA: Diagnosis present

## 2021-07-17 DIAGNOSIS — E876 Hypokalemia: Secondary | ICD-10-CM

## 2021-07-17 DIAGNOSIS — C3490 Malignant neoplasm of unspecified part of unspecified bronchus or lung: Secondary | ICD-10-CM

## 2021-07-17 DIAGNOSIS — R197 Diarrhea, unspecified: Secondary | ICD-10-CM | POA: Diagnosis not present

## 2021-07-17 DIAGNOSIS — C3432 Malignant neoplasm of lower lobe, left bronchus or lung: Secondary | ICD-10-CM | POA: Diagnosis not present

## 2021-07-17 DIAGNOSIS — R110 Nausea: Secondary | ICD-10-CM | POA: Diagnosis not present

## 2021-07-17 DIAGNOSIS — Z08 Encounter for follow-up examination after completed treatment for malignant neoplasm: Secondary | ICD-10-CM | POA: Insufficient documentation

## 2021-07-17 DIAGNOSIS — F17210 Nicotine dependence, cigarettes, uncomplicated: Secondary | ICD-10-CM | POA: Diagnosis not present

## 2021-07-17 LAB — CMP (CANCER CENTER ONLY)
ALT: 17 U/L (ref 0–44)
AST: 18 U/L (ref 15–41)
Albumin: 3.8 g/dL (ref 3.5–5.0)
Alkaline Phosphatase: 75 U/L (ref 38–126)
Anion gap: 8 (ref 5–15)
BUN: 11 mg/dL (ref 6–20)
CO2: 27 mmol/L (ref 22–32)
Calcium: 8.8 mg/dL — ABNORMAL LOW (ref 8.9–10.3)
Chloride: 104 mmol/L (ref 98–111)
Creatinine: 0.51 mg/dL (ref 0.44–1.00)
GFR, Estimated: >60 mL/min (ref >60)
Glucose, Bld: 129 mg/dL — ABNORMAL HIGH (ref 70–99)
Potassium: 2.9 mmol/L — ABNORMAL LOW (ref 3.5–5.1)
Sodium: 139 mmol/L (ref 135–145)
Total Bilirubin: 0.2 mg/dL — ABNORMAL LOW (ref 0.3–1.2)
Total Protein: 7.2 g/dL (ref 6.5–8.1)

## 2021-07-17 LAB — CBC WITH DIFFERENTIAL (CANCER CENTER ONLY)
Abs Immature Granulocytes: 0.02 10*3/uL (ref 0.00–0.07)
Basophils Absolute: 0.1 10*3/uL (ref 0.0–0.1)
Basophils Relative: 1 %
Eosinophils Absolute: 0.2 10*3/uL (ref 0.0–0.5)
Eosinophils Relative: 2 %
HCT: 37.3 % (ref 36.0–46.0)
Hemoglobin: 12.6 g/dL (ref 12.0–15.0)
Immature Granulocytes: 0 %
Lymphocytes Relative: 35 %
Lymphs Abs: 2.9 10*3/uL (ref 0.7–4.0)
MCH: 30.4 pg (ref 26.0–34.0)
MCHC: 33.8 g/dL (ref 30.0–36.0)
MCV: 89.9 fL (ref 80.0–100.0)
Monocytes Absolute: 0.6 10*3/uL (ref 0.1–1.0)
Monocytes Relative: 7 %
Neutro Abs: 4.7 10*3/uL (ref 1.7–7.7)
Neutrophils Relative %: 55 %
Platelet Count: 250 10*3/uL (ref 150–400)
RBC: 4.15 MIL/uL (ref 3.87–5.11)
RDW: 14 % (ref 11.5–15.5)
WBC Count: 8.4 10*3/uL (ref 4.0–10.5)
nRBC: 0 % (ref 0.0–0.2)

## 2021-07-17 MED ORDER — POTASSIUM CHLORIDE 20 MEQ/15ML (10%) PO SOLN: 20.0000 meq | Freq: Two times a day (BID) | ORAL | 0 refills | Status: AC

## 2021-07-30 IMAGING — DX DG CHEST 1V PORT
1 series · 1 of 1 positions shown · non-contrast
Comparison: 10/23/2018

CLINICAL DATA: Shortness of breath

EXAM:
PORTABLE CHEST 1 VIEW

[chest ap]
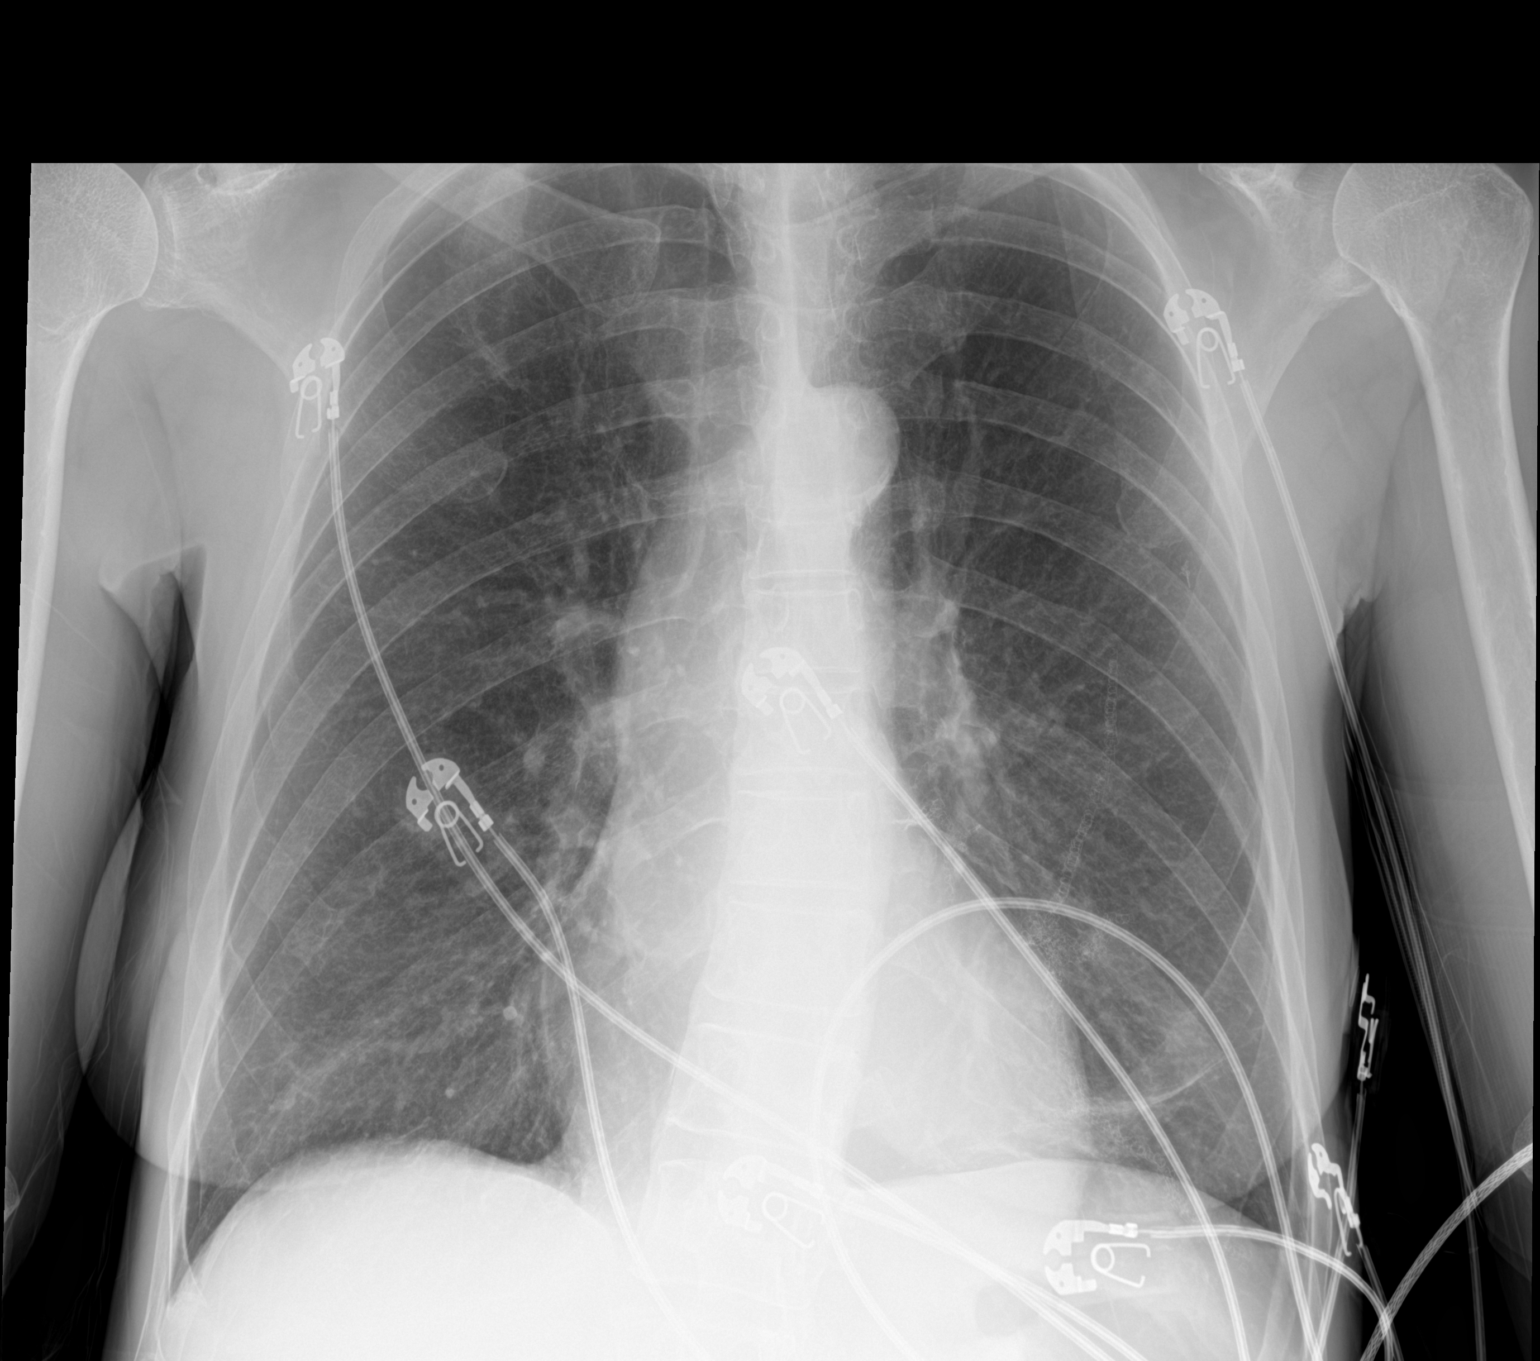

[1 of 1 positions shown; findings below may reference images not displayed]

FINDINGS: The heart size and mediastinal contours are within normal limits.
Postoperative changes in the left hemithorax. Linear atelectasis in
the left lung base. Right lung is clear. No pleural effusion. No
pneumothorax. The visualized skeletal structures are unremarkable.
IMPRESSION: 1. No active cardiopulmonary disease.
2. Postoperative changes in the left hemithorax. Linear atelectasis
in the left lung base.

## 2021-07-31 ENCOUNTER — Other Ambulatory Visit (HOSPITAL_COMMUNITY): Payer: Self-pay

## 2021-08-01 IMAGING — CR DG TIBIA/FIBULA 2V*L*
2 series · 2 of 2 positions shown · non-contrast
Comparison: Radiographs 03/04/2017

CLINICAL DATA: Persistent generalized muscle aches for 1 week, no
injury

EXAM:
LEFT TIBIA AND FIBULA - 2 VIEW

[tibia ap]
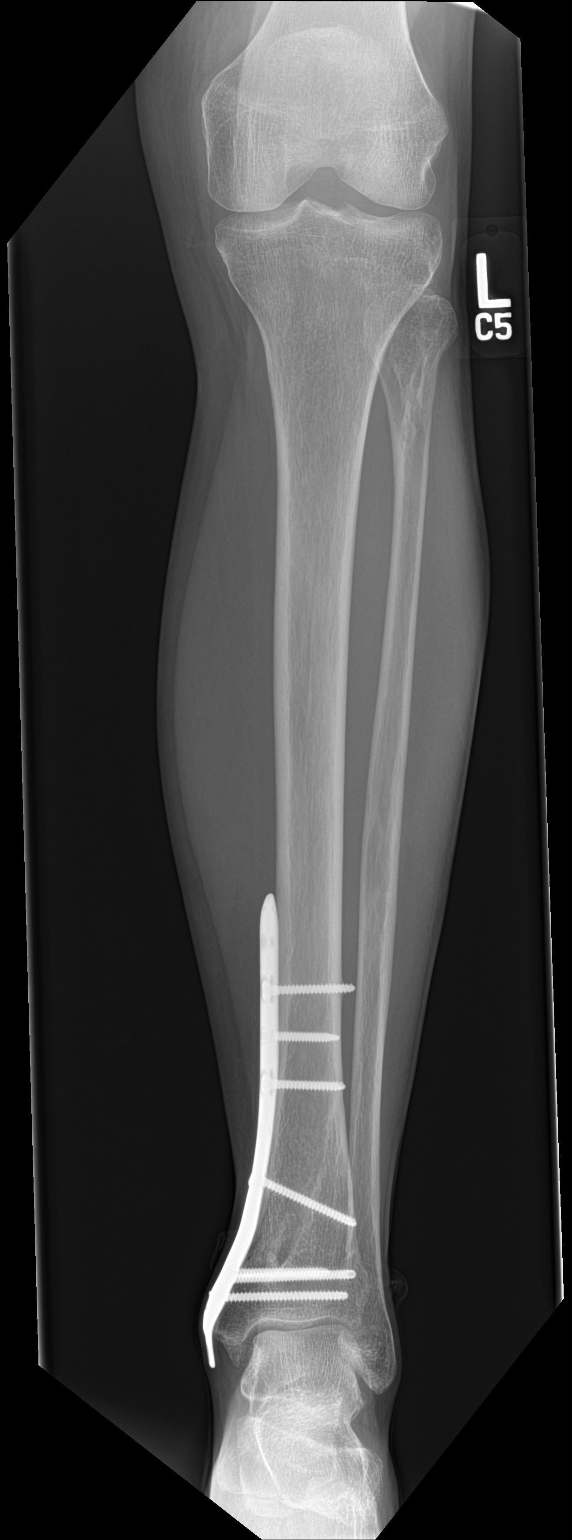

[tibia lat]
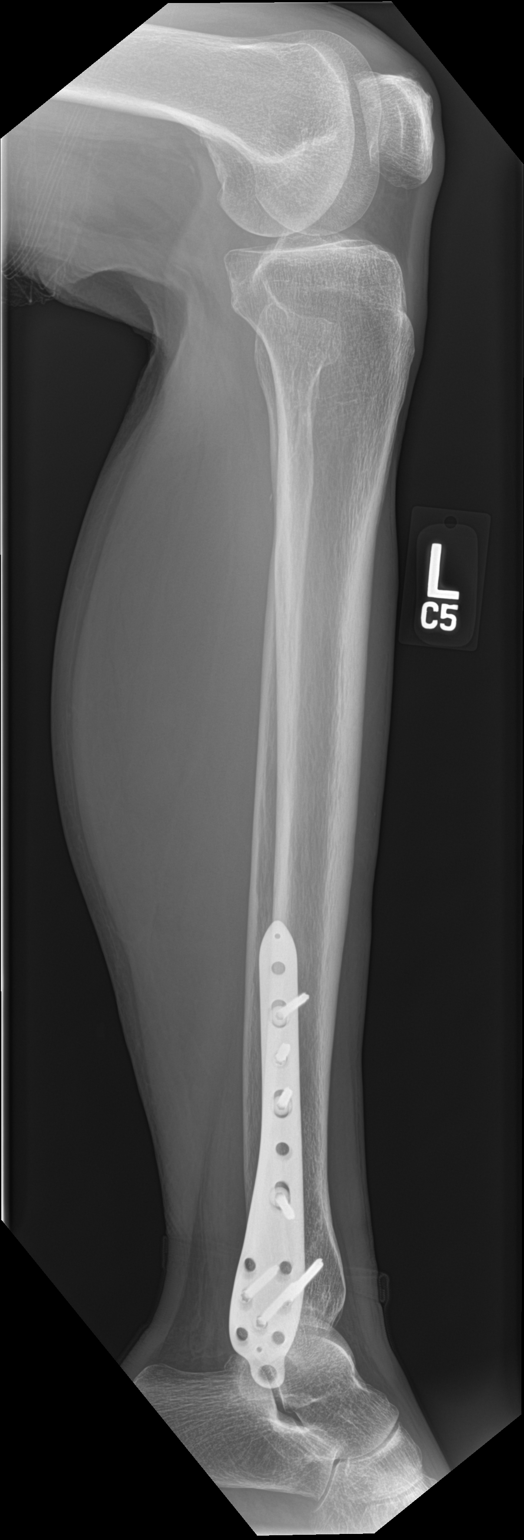

[2 of 2 positions shown; findings below may reference images not displayed]

FINDINGS: Prior medial sideplate and screw fixation of a distal tibial spiral
fracture seen on comparison imaging. Hardware is intact and well
aligned. No acute fracture or traumatic malalignment is seen. No
soft tissue gas or foreign body. No abnormal soft tissue swelling.
IMPRESSION: Prior medial sideplate and screw fixation of the distal tibia. No
acute hardware complication.

No acute osseous or soft tissue abnormality.

## 2021-08-24 IMAGING — CR DG HIP (WITH OR WITHOUT PELVIS) 2-3V*R*
3 series · 3 of 3 positions shown · non-contrast
Comparison: CT scan of the abdomen and pelvis dated 03/04/2019

CLINICAL DATA: Right hip pain secondary to a fall down stairs.

EXAM:
DG HIP (WITH OR WITHOUT PELVIS) 2-3V RIGHT

[t pelvis ap]
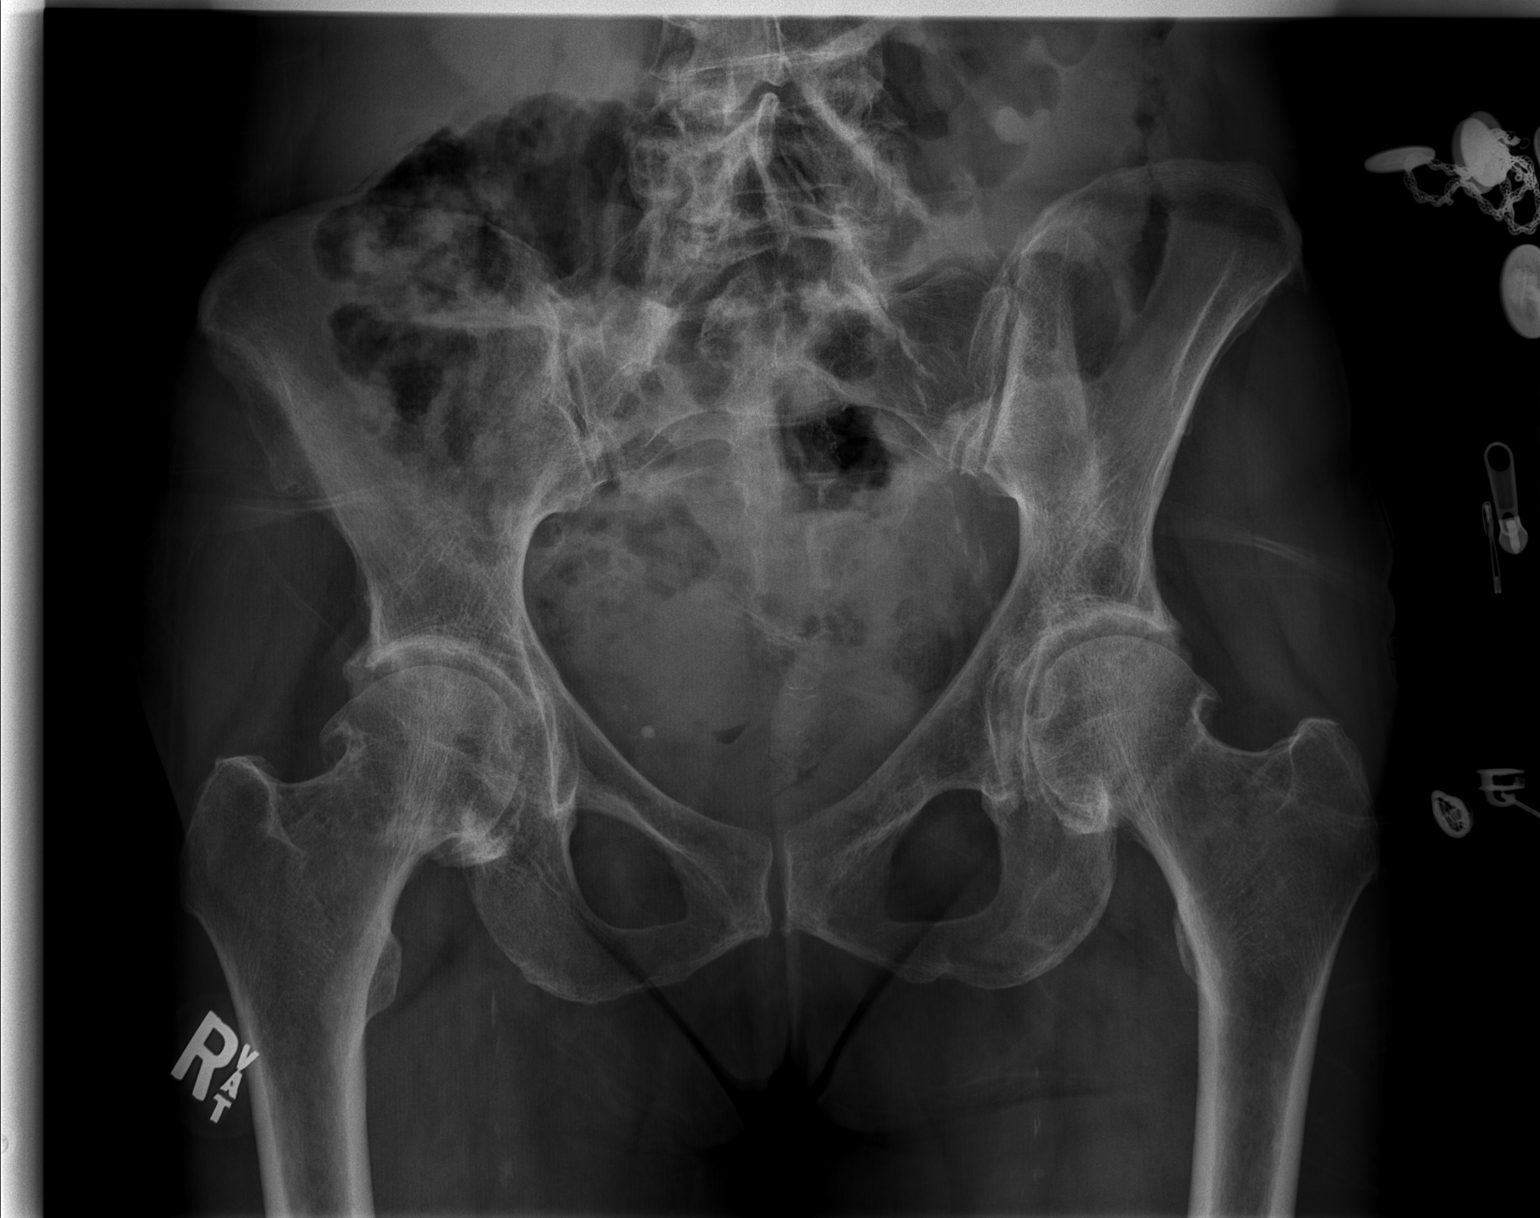

[t hip ap right]
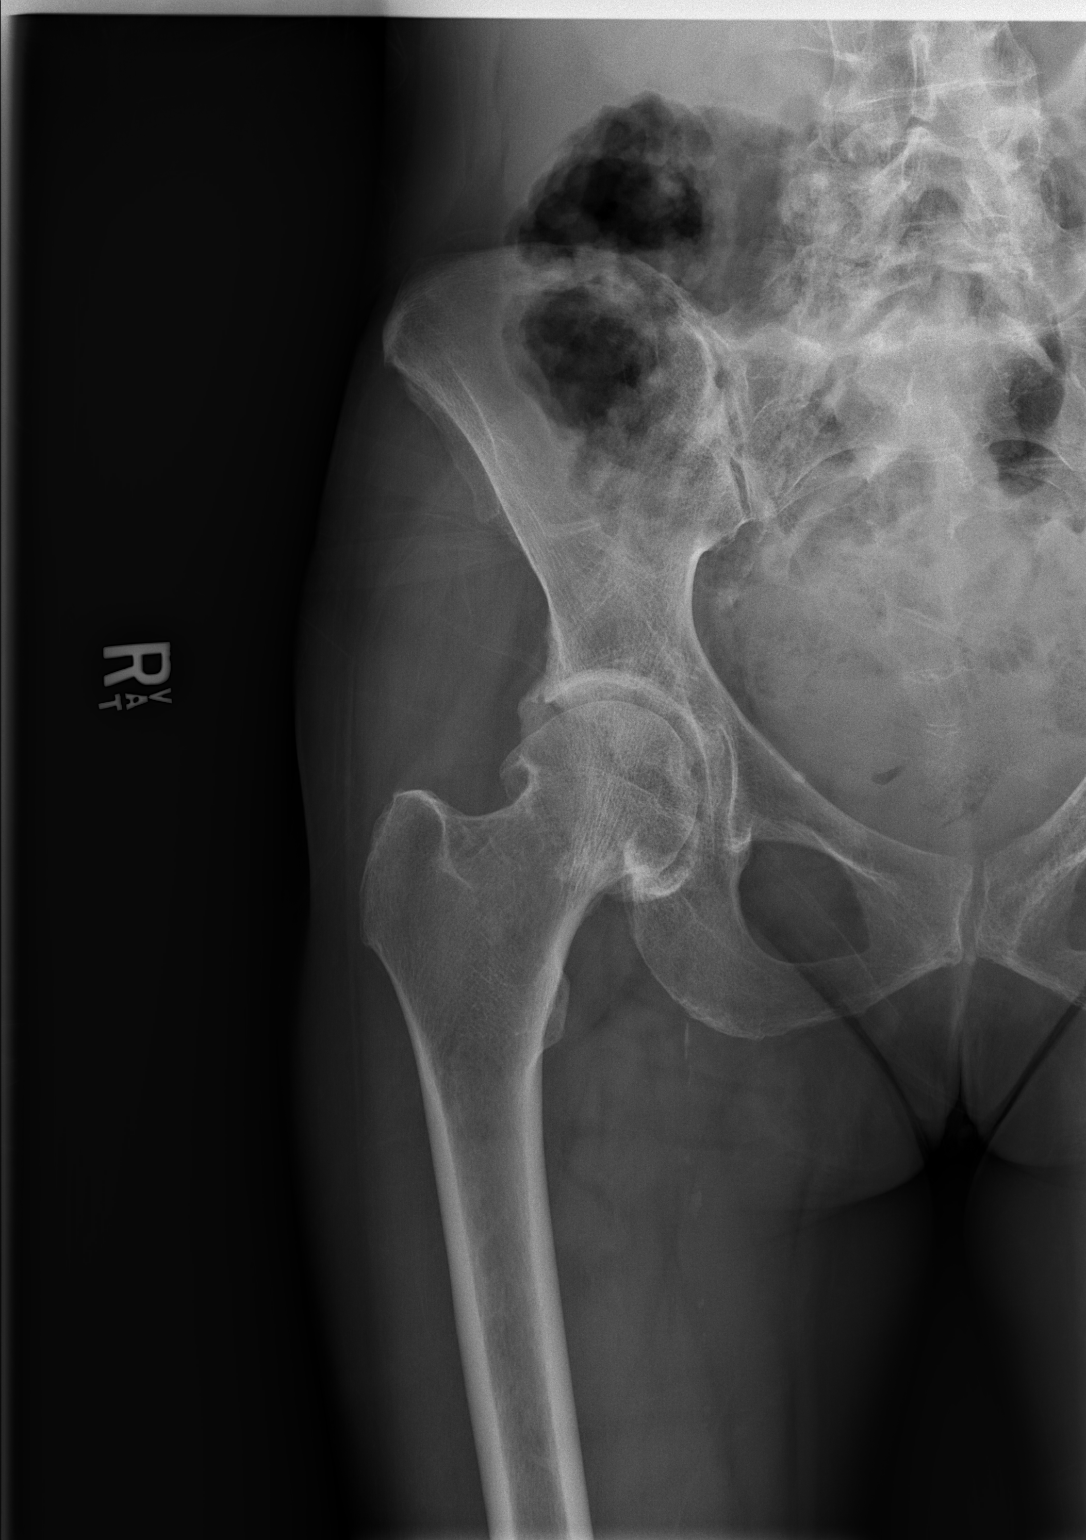

[t hip frog leg right]
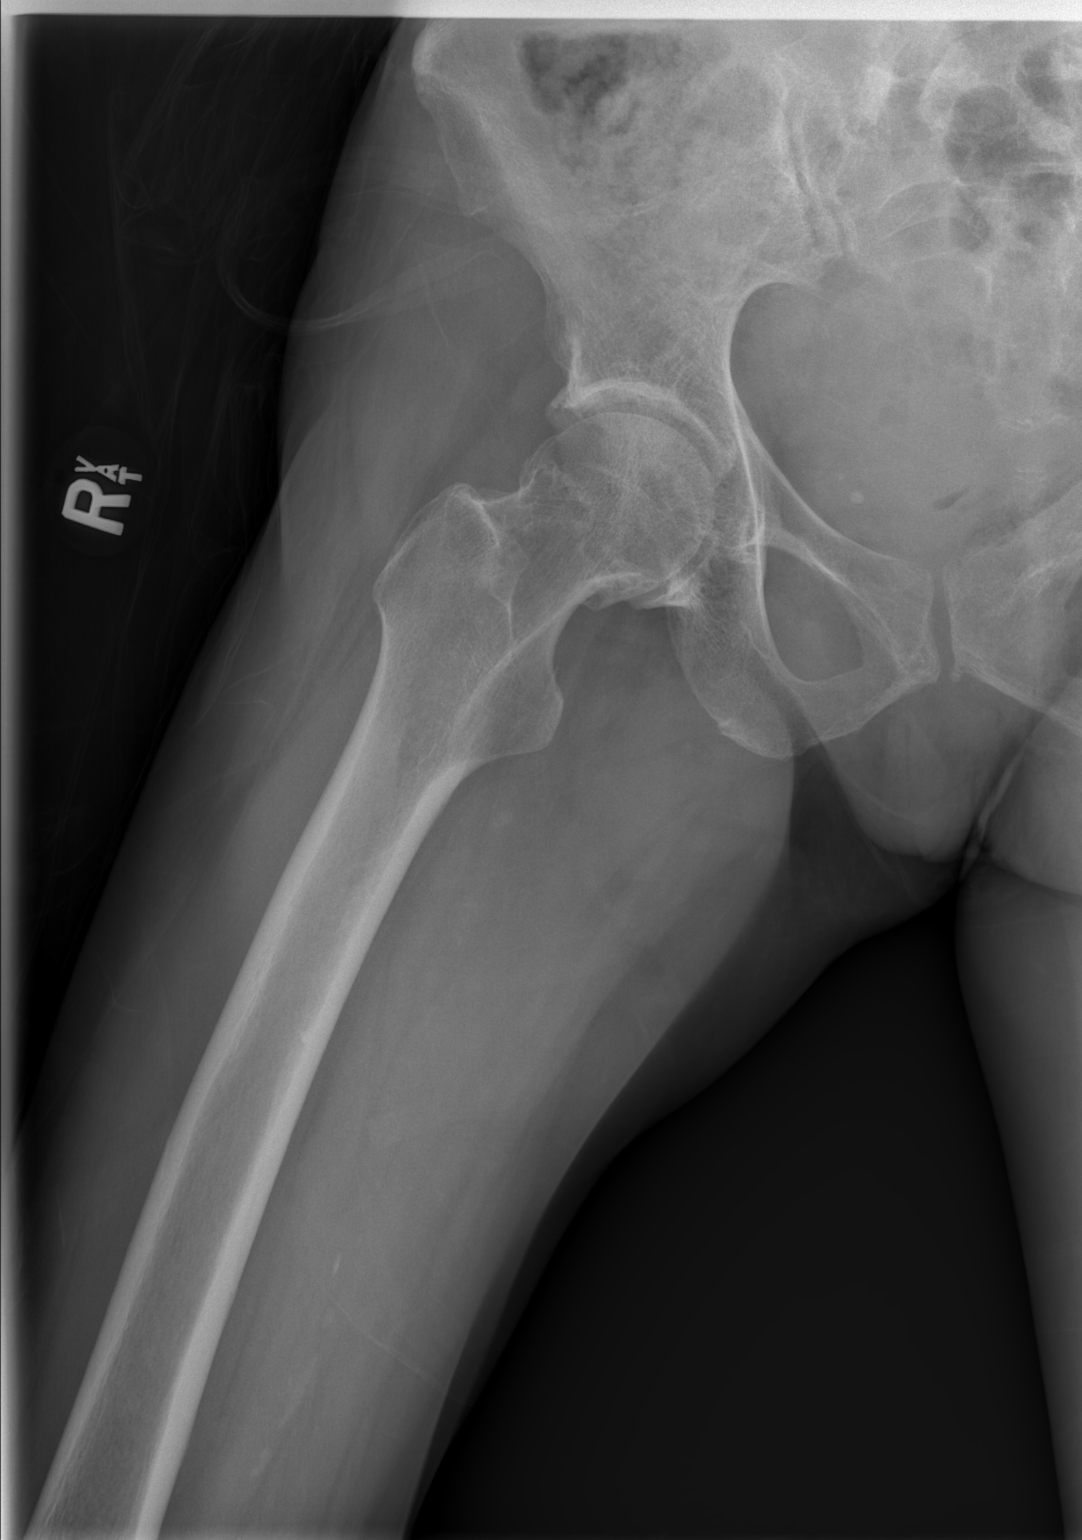

[3 of 3 positions shown; findings below may reference images not displayed]

FINDINGS: There is no fracture or dislocation. The patient has moderately
severe bilateral osteoarthritis of the hips, worse on the right than
the left. The other pelvic bones appear normal.
IMPRESSION: No acute abnormality. Moderately severe osteoarthritis of the hips,
right greater than left.

## 2021-09-11 DEATH — deceased

## 2021-11-14 ENCOUNTER — Telehealth: Payer: Self-pay

## 2021-11-14 ENCOUNTER — Other Ambulatory Visit: Payer: Self-pay

## 2021-11-14 DIAGNOSIS — C3432 Malignant neoplasm of lower lobe, left bronchus or lung: Secondary | ICD-10-CM

## 2021-11-14 NOTE — Telephone Encounter (Signed)
Attempted to reach pt to remind her to schedule her CT scan. Her phone number was disconnected. I then call pts friend/Cindy Aris Lot, and was advised by her that pt passed away 09/12/21.  I have cancelled all of pts appointments and marked her chart as deceased.

## 2021-11-18 ENCOUNTER — Inpatient Hospital Stay: Payer: Medicaid Other

## 2021-11-20 ENCOUNTER — Inpatient Hospital Stay: Payer: Medicaid Other | Admitting: Internal Medicine

## 2022-01-02 IMAGING — CT CT CHEST W/ CM
2 of 4 series · 14 of 36 positions shown, 17 images · IV contrast (OMNIPAQUE)
Comparison: Chest CT from March 30, 2017

CLINICAL DATA: Non-small cell lung cancer staging. Shortness of
breath since positive COVID.

EXAM:
CT CHEST WITH CONTRAST
TECHNIQUE: Multidetector CT imaging of the chest was performed during
intravenous contrast administration.
CONTRAST:  75mL OMNIPAQUE IOHEXOL 300 MG/ML  SOLN

[Series 2: axial st · axial · 0.66mm/px · z∈[-405,-129]mm · 11 of 164 slices shown, 14 images]
[im 13/164  mediastinal]
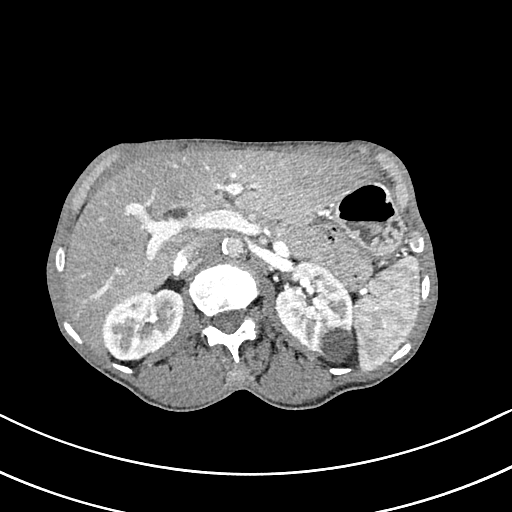
[im 13/164  lung]
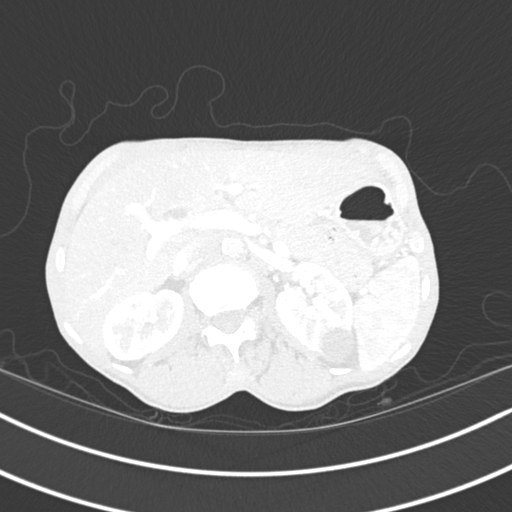
[im 26/164  lung]
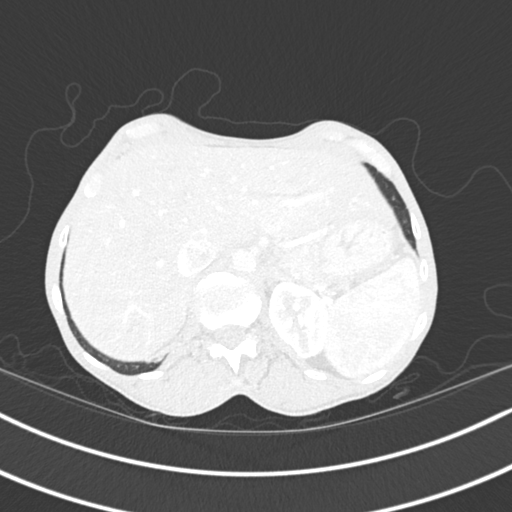
[im 38/164  lung]
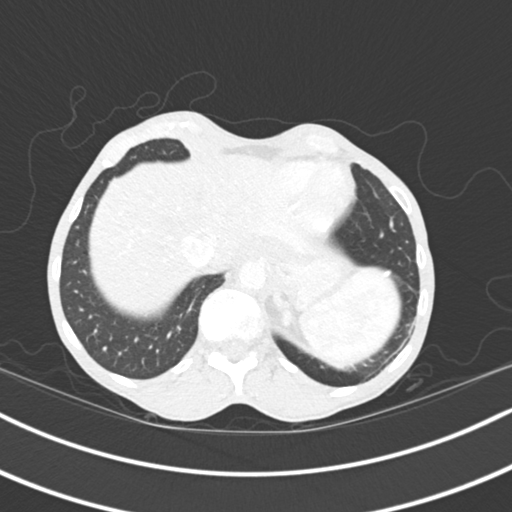
[im 51/164  lung]
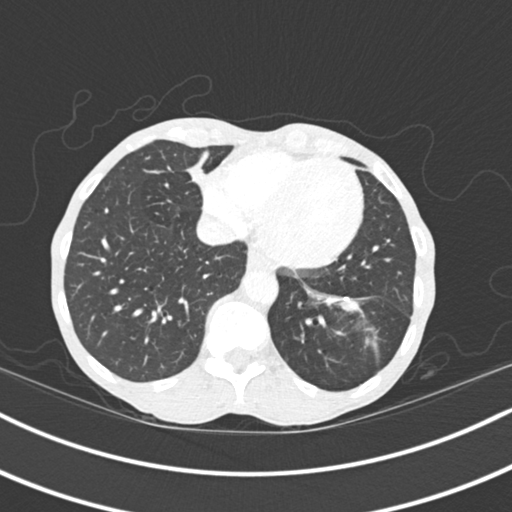
[im 63/164  mediastinal]
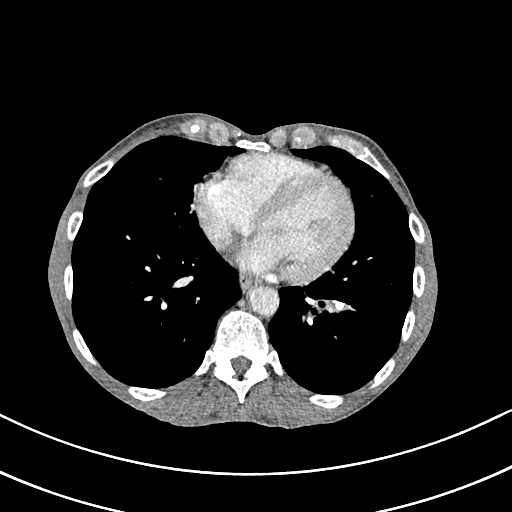
[im 63/164  lung]
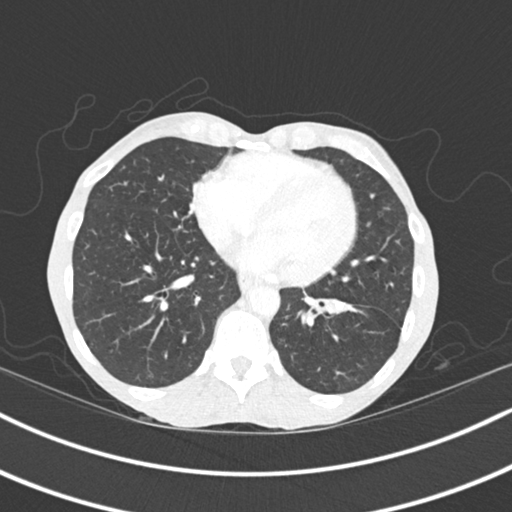
[im 88/164  lung]
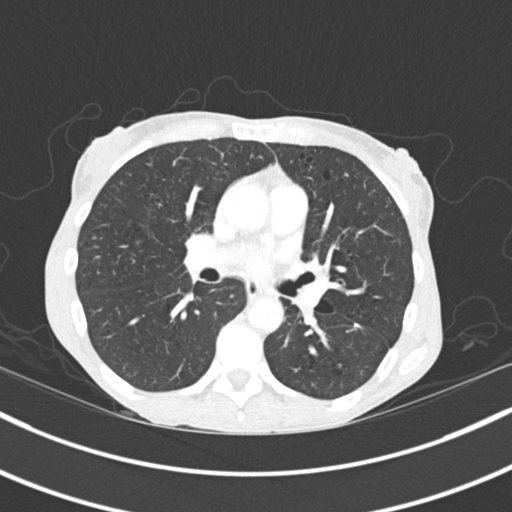
[im 101/164  lung]
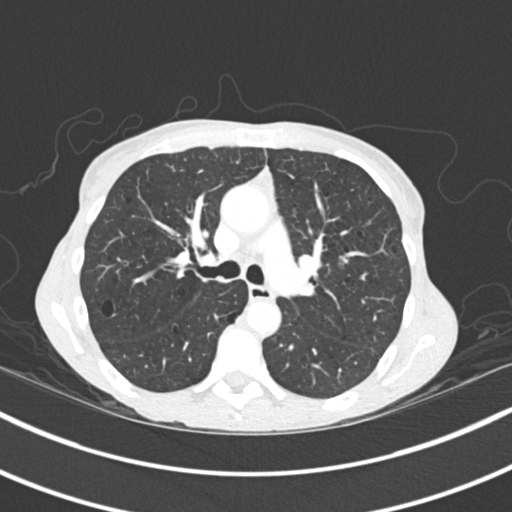
[im 113/164  lung]
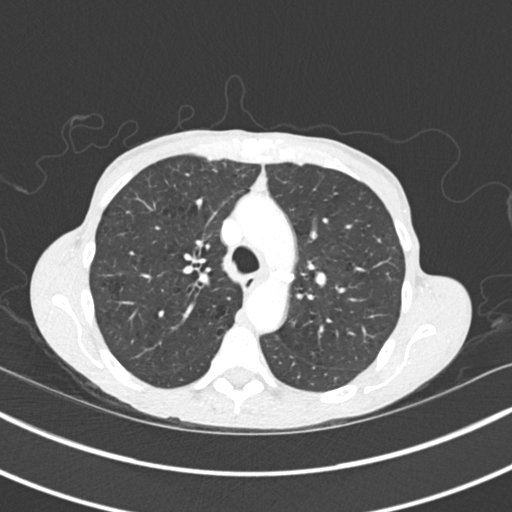
[im 126/164  mediastinal]
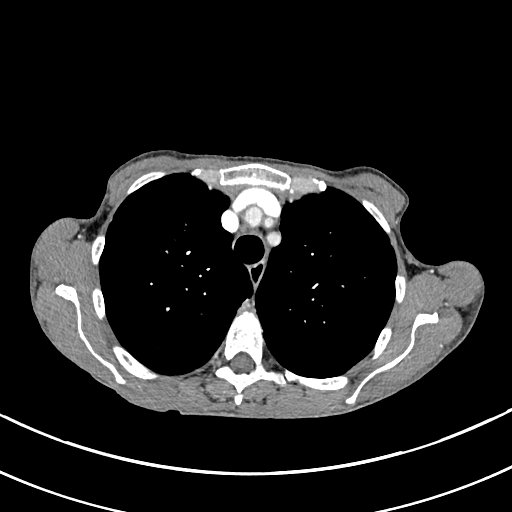
[im 126/164  lung]
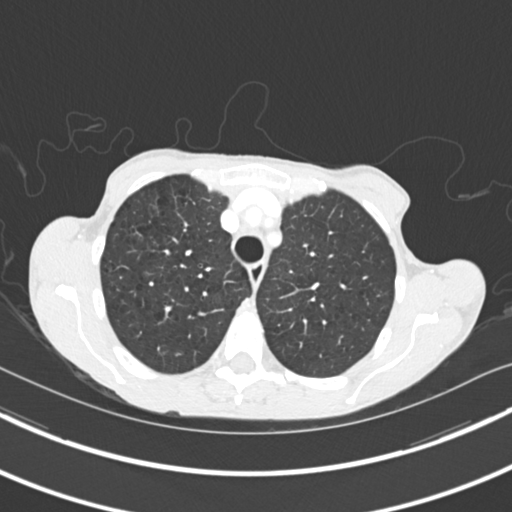
[im 138/164  lung]
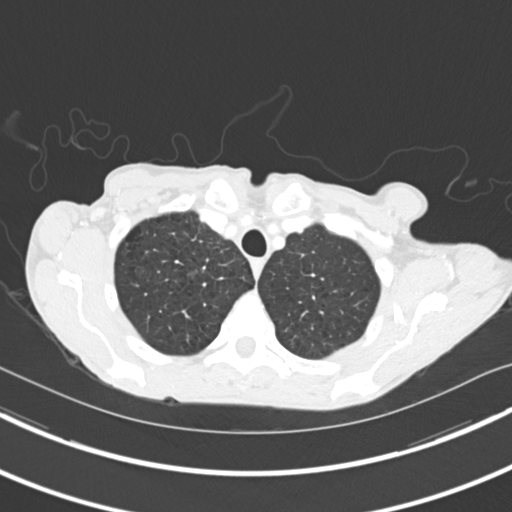
[im 151/164  lung]
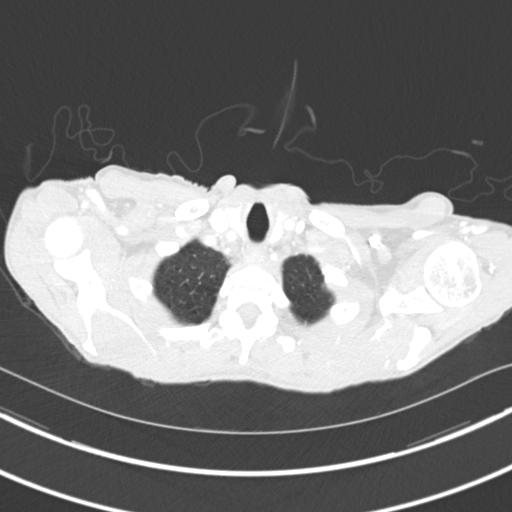

[Series 5: coronal · coronal · 0.69mm/px · 3 of 113 slices shown]
[im 23/113  lung]
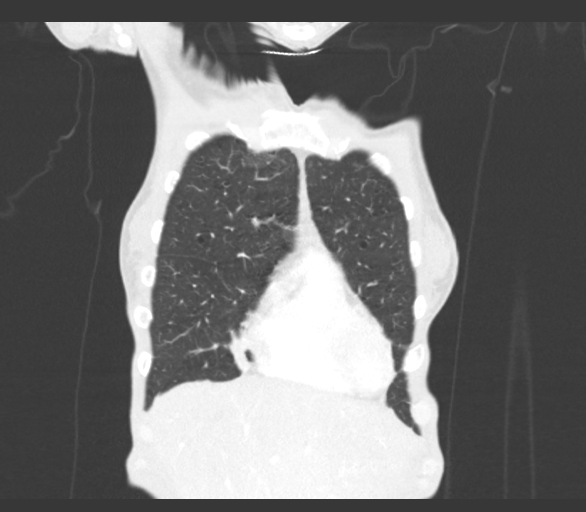
[im 45/113  lung]
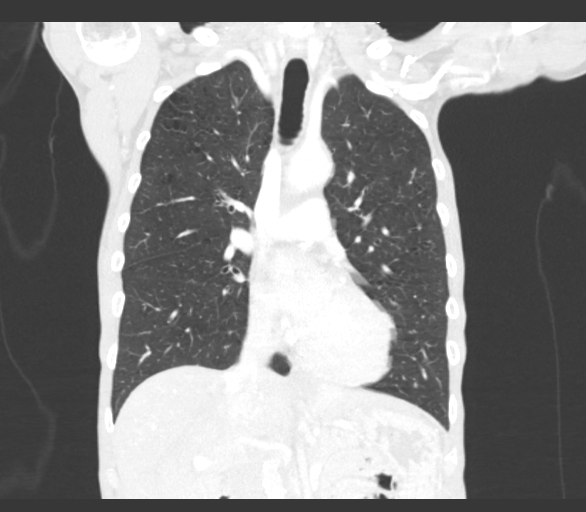
[im 68/113  lung]
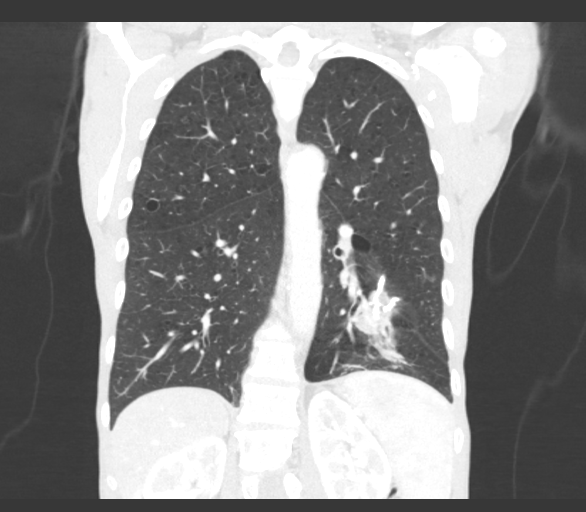

[14 of 36 positions shown; findings below may reference images not displayed]

FINDINGS: Cardiovascular: Atheromatous plaque of the thoracic aorta similar to
prior exams. Heart size is stable. No pericardial effusion. Central
pulmonary arteries are unremarkable.

Mediastinum/Nodes: CIS superior mediastinum and thoracic inlet are
unremarkable. No adenopathy in the bilateral hila or in the
mediastinum. Mildly enlarged RIGHT axillary lymph nodes, largest
approximately 8 mm (image 25, series 2) also tracking into
subpectoral nodal chains, there was some lymph nodes in this
location before but these are slightly increased in size.

Lungs/Pleura: Mild thickening along suture lines in the LEFT chest
slightly diminished approximately 8 mm as compared to 9 mm on the
previous exam.

Triangular opacity along the RIGHT heart border in the RIGHT middle
lobe. No new area of nodularity. This measures approximately 1.5 x
1.0 cm but is contiguous with bandlike opacity in the RIGHT middle
lobe and there is material also in the RIGHT mainstem bronchus.

No consolidation. No pleural effusion. Signs of emphysema as before.

Small nodular densities in the 3-4 mm range in the RIGHT lower lobe,
for example on image 99 of series 7 stable.

Upper Abdomen: No acute findings in the upper abdomen. There is very
subtle increased density along the LEFT periaortic chain and
adjacent to the SMA. (Image 145, series 2) also suggestion of
increased soft tissue along the LEFT periaortic chain. (Image 164,
series 2) 1 cm. This could also be related to very low-dose
technique and the presence of noise on the current study. Also the
lack of intra-abdominal fat.

Musculoskeletal: No acute bone finding. No destructive bone process.
Signs of compression fracture at T4 and T7 unchanged from previous
imaging.
IMPRESSION: 1. Triangular opacity along the RIGHT heart border in the RIGHT
middle lobe is contiguous with bandlike opacity in the RIGHT middle
lobe. Findings may be related to post inflammatory changes and early
scarring. Consider three-month follow-up to assess for resolution.
2. Mildly enlarged RIGHT axillary lymph nodes, largest approximately
8 mm as compared to 9 mm on the previous exam. There was some lymph
nodes in this location before but these are slightly increased in
size. Close attention on follow-up. Also correlate with any recent
history of [id number] vaccination which could cause this appearance.
3. There is very subtle increased density along the LEFT periaortic
chain and adjacent to the SMA. Also suggestion of very subtle
increased soft tissue along the LEFT periaortic chain. This could
also be related to very low-dose technique and the presence of noise
on the current study. Dedicated abdominal imaging may be helpful for
further assessment.
4. Signs of emphysema as before.
5. Emphysema and aortic atherosclerosis.

Aortic Atherosclerosis (C0KEP-31Y.Y) and Emphysema (C0KEP-GLL.G).

## 2022-06-09 ENCOUNTER — Other Ambulatory Visit (HOSPITAL_COMMUNITY): Payer: Self-pay

## 2023-04-17 IMAGING — DX DG CHEST 1V PORT
1 series · 1 of 1 positions shown · non-contrast
Comparison: 03/04/2019

CLINICAL DATA: Cough and leukocytosis

EXAM:
PORTABLE CHEST 1 VIEW

[chest ap]
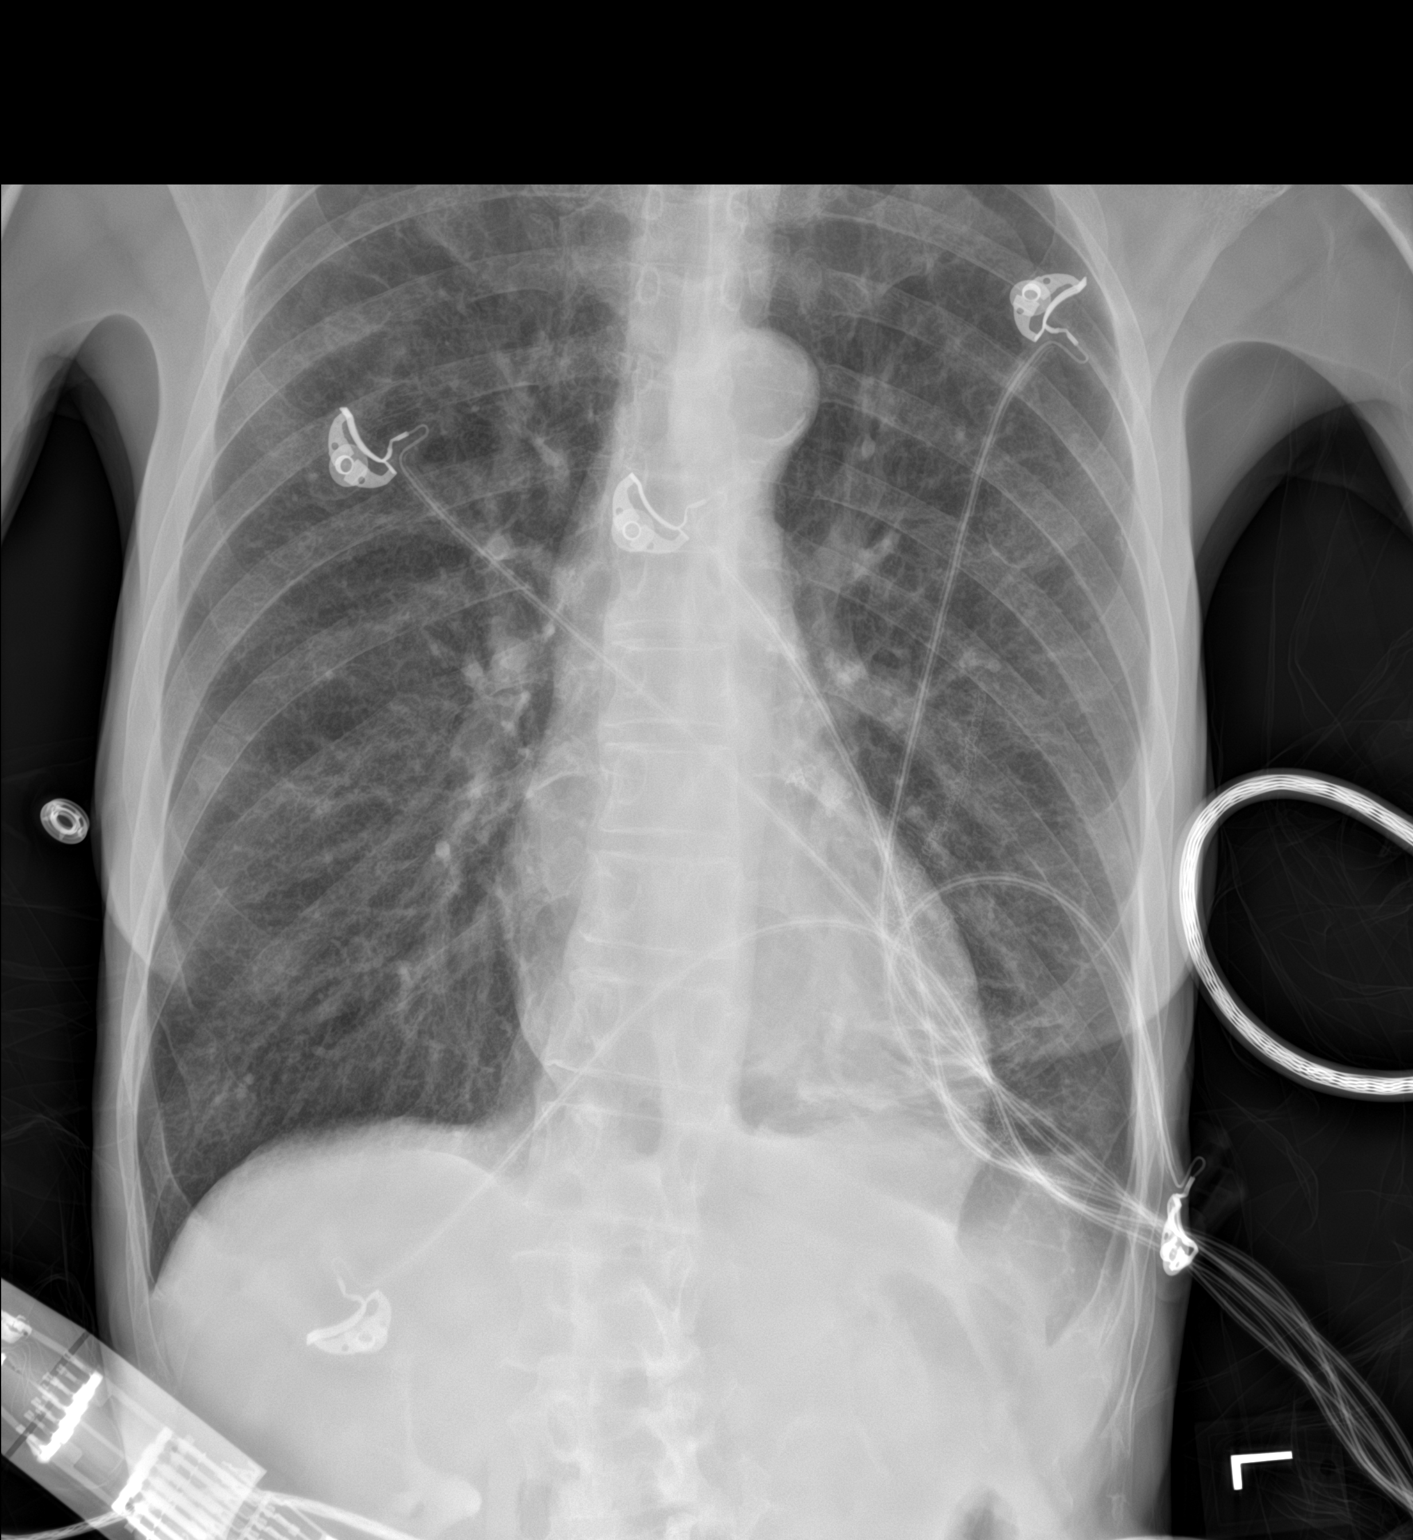

[1 of 1 positions shown; findings below may reference images not displayed]

FINDINGS: Cardiac shadow is stable. Aortic calcifications are noted. Skin fold
is noted over the right apex simulating pneumothorax although lung
markings are noted beyond this density. Similar findings are noted
on the left. Lungs are hyperinflated with mild left basilar
infiltrate similar to that seen on recent CT examination. No sizable
effusion is noted. No bony abnormality is seen.
IMPRESSION: Left basilar infiltrate similar to that seen on prior CT.

Aortic Atherosclerosis (3TYFE-CPI.I) and Emphysema (3TYFE-IE2.W).

## 2023-04-17 IMAGING — CT CT ABD-PELV W/ CM
2 of 5 series · 15 of 46 positions shown, 17 images · IV contrast (APPLIED)
Comparison: 03/04/2019

CLINICAL DATA: Diffuse abdominal pain.

EXAM:
CT ABDOMEN AND PELVIS WITH CONTRAST
TECHNIQUE: Multidetector CT imaging of the abdomen and pelvis was performed
using the standard protocol following bolus administration of
intravenous contrast.
CONTRAST:  100mL OMNIPAQUE IOHEXOL 300 MG/ML  SOLN

[Series 3: abdomen 5.0 · axial · 0.60mm/px · z∈[-436,-92]mm · 12 of 81 slices shown, 14 images]
[im 6/81  soft-tissue]
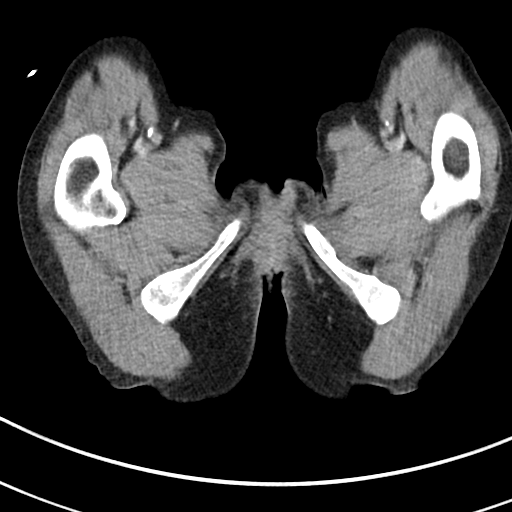
[im 6/81  bone]
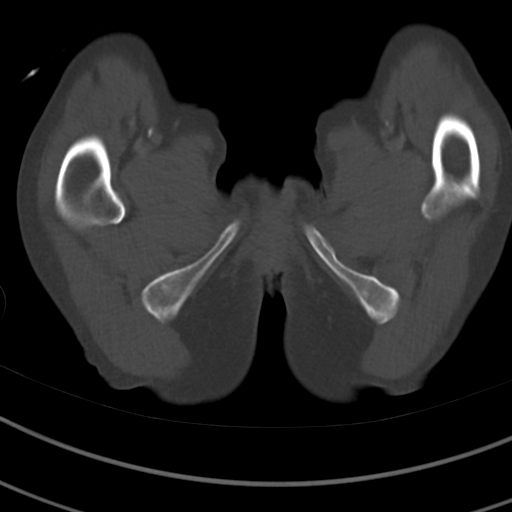
[im 11/81  soft-tissue]
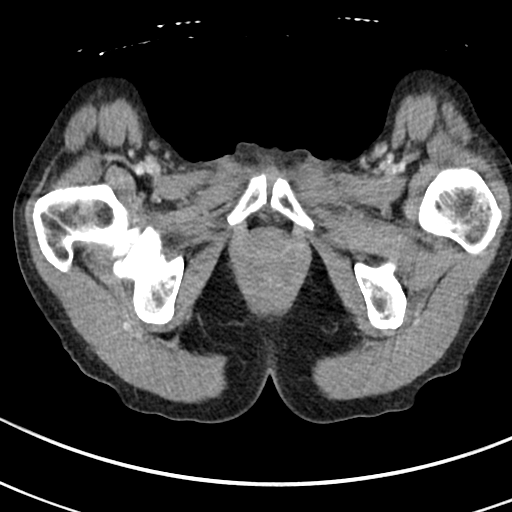
[im 17/81  soft-tissue]
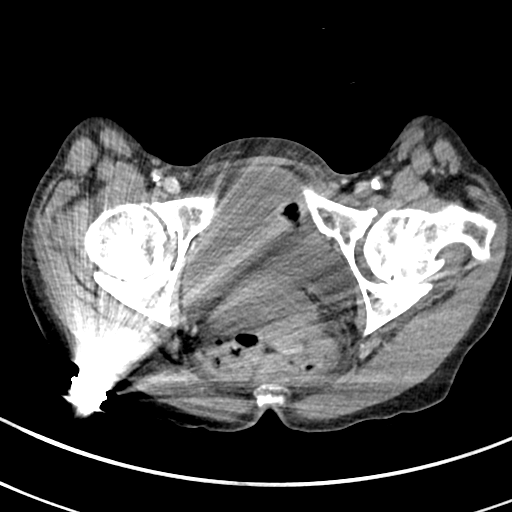
[im 27/81  soft-tissue]
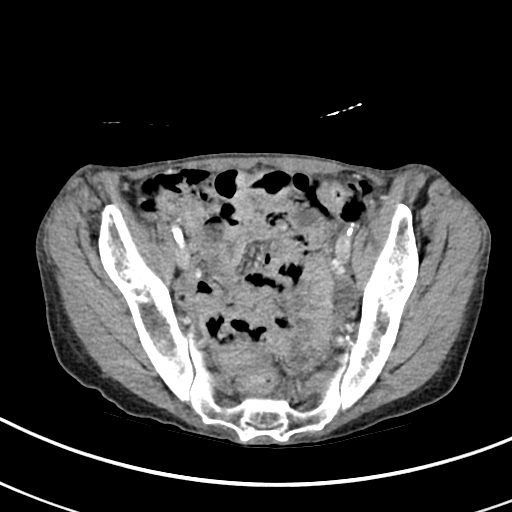
[im 33/81  soft-tissue]
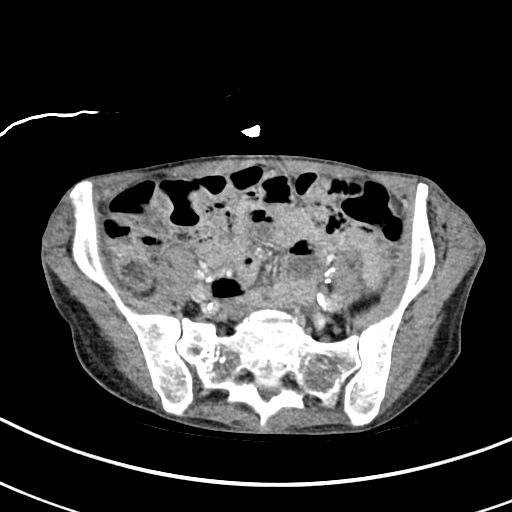
[im 38/81  soft-tissue]
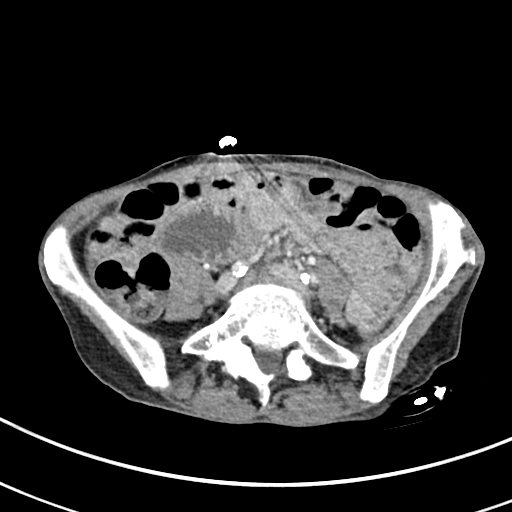
[im 43/81  soft-tissue]
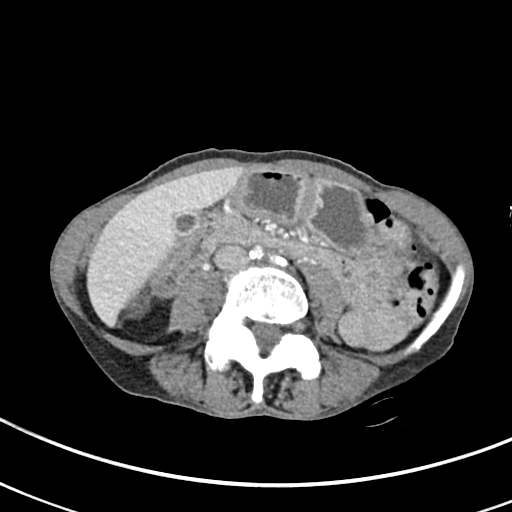
[im 49/81  soft-tissue]
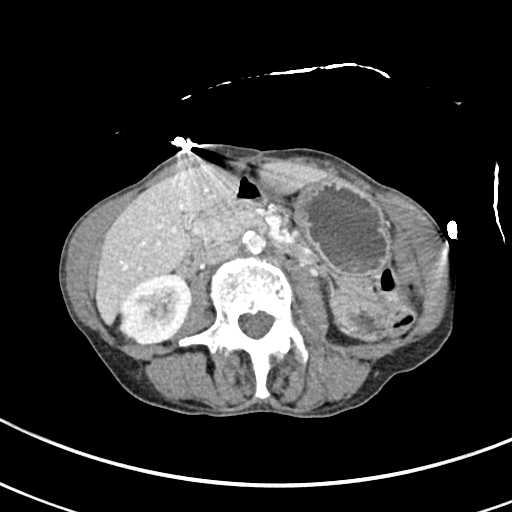
[im 54/81  soft-tissue]
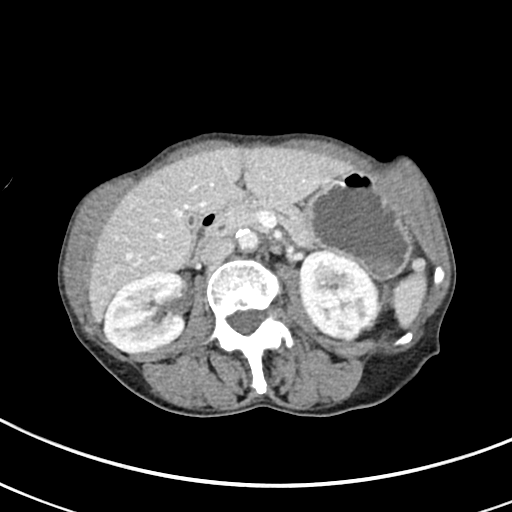
[im 54/81  bone]
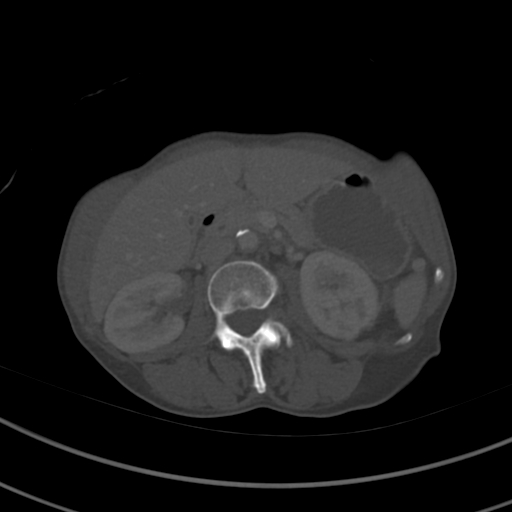
[im 65/81  soft-tissue]
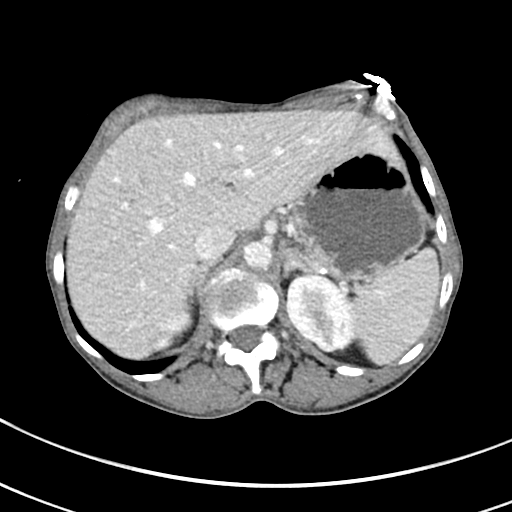
[im 70/81  soft-tissue]
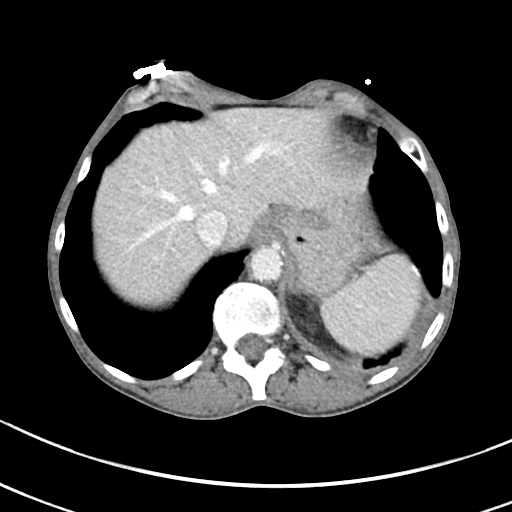
[im 75/81  soft-tissue]
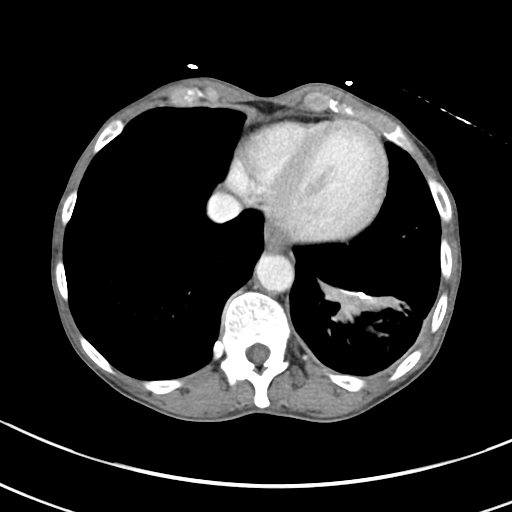

[Series 6: abdomen 3.0 mpr cor · coronal · 0.64mm/px · 3 of 90 slices shown]
[im 40/90  soft-tissue]
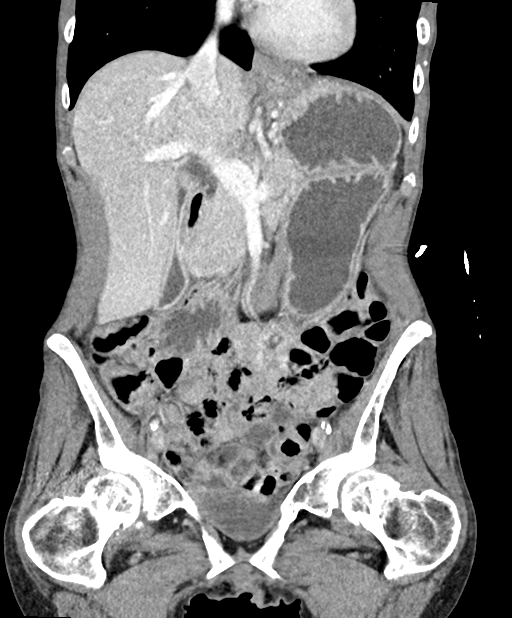
[im 50/90  soft-tissue]
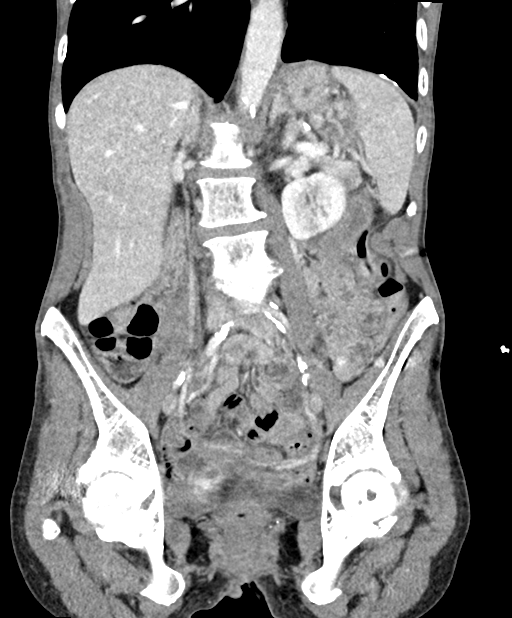
[im 60/90  soft-tissue]
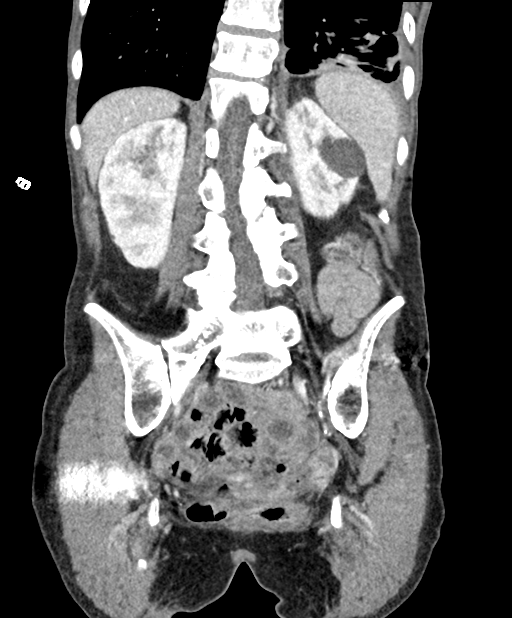

[15 of 46 positions shown; findings below may reference images not displayed]

FINDINGS: Lower chest: Increased volume loss and consolidative opacity in the
left lower lobe in this patient status post anterior basal
segmentectomy. Some of the increased opacity has a nodular
configuration while other areas are more elongated, finger-like
projections. Findings may reflect progression of an atypical
infectious etiology.

Hepatobiliary: No suspicious focal abnormality within the liver
parenchyma. Gallbladder is nondistended. No intrahepatic or
extrahepatic biliary dilation.

Pancreas: No focal mass lesion. No dilatation of the main duct. No
intraparenchymal cyst. No peripancreatic edema.

Spleen: No splenomegaly. No focal mass lesion.

Adrenals/Urinary Tract: No adrenal nodule or mass. 7 mm low-density
lesion lower pole right kidney is too small to characterize but
likely benign. 3.1 cm well-defined homogeneous low-density lesion
in the upper pole pole left kidney is similar to prior, compatible
with a cyst. No evidence for hydroureter. The urinary bladder
appears normal for the degree of distention.

Stomach/Bowel: Stomach is moderately distended with fluid. Duodenum
shows fluid filled distended lumen. No small bowel dilatation.
Terminal ileum not discretely evident. The appendix is not well
visualized, but there is no edema or inflammation in the region of
the cecum. No gross colonic mass. No colonic wall thickening.

Vascular/Lymphatic: There is advanced atherosclerotic calcification
of the abdominal aorta without aneurysm. There is no gastrohepatic
or hepatoduodenal ligament lymphadenopathy. No retroperitoneal or
mesenteric lymphadenopathy. No pelvic sidewall lymphadenopathy.

Reproductive: Uterus surgically absent. Dominant follicle noted left
ovary. No right adnexal mass.

Other: No intraperitoneal free fluid.

Musculoskeletal: Degenerative changes noted in both hips. Patient is
fused across the L3-4 interspace.
IMPRESSION: 1. No acute findings in the abdomen or pelvis. Specifically, no
findings to explain the patient's history of abdominal pain.
2. Since prior abdomen/pelvis CT and chest CT of 08/07/2019,
increased volume loss and consolidative opacity in the left lower
lobe in this patient status post anterior basal segmentectomy. Some
of the increased opacity has a nodular configuration while other
areas are more elongated, finger-like projections. Findings may
reflect progression of an atypical infectious etiology. Dedicated CT
chest recommended to further evaluate.
3. Aortic Atherosclerosis (UIX0H-OQZ.Z).

## 2023-09-11 IMAGING — CR DG CHEST 2V
2 series · 2 of 2 positions shown · non-contrast
Comparison: 11/19/2020 radiographs and CT

CLINICAL DATA: Fever.  Nausea and vomiting.

EXAM:
CHEST - 2 VIEW

[chest pa]
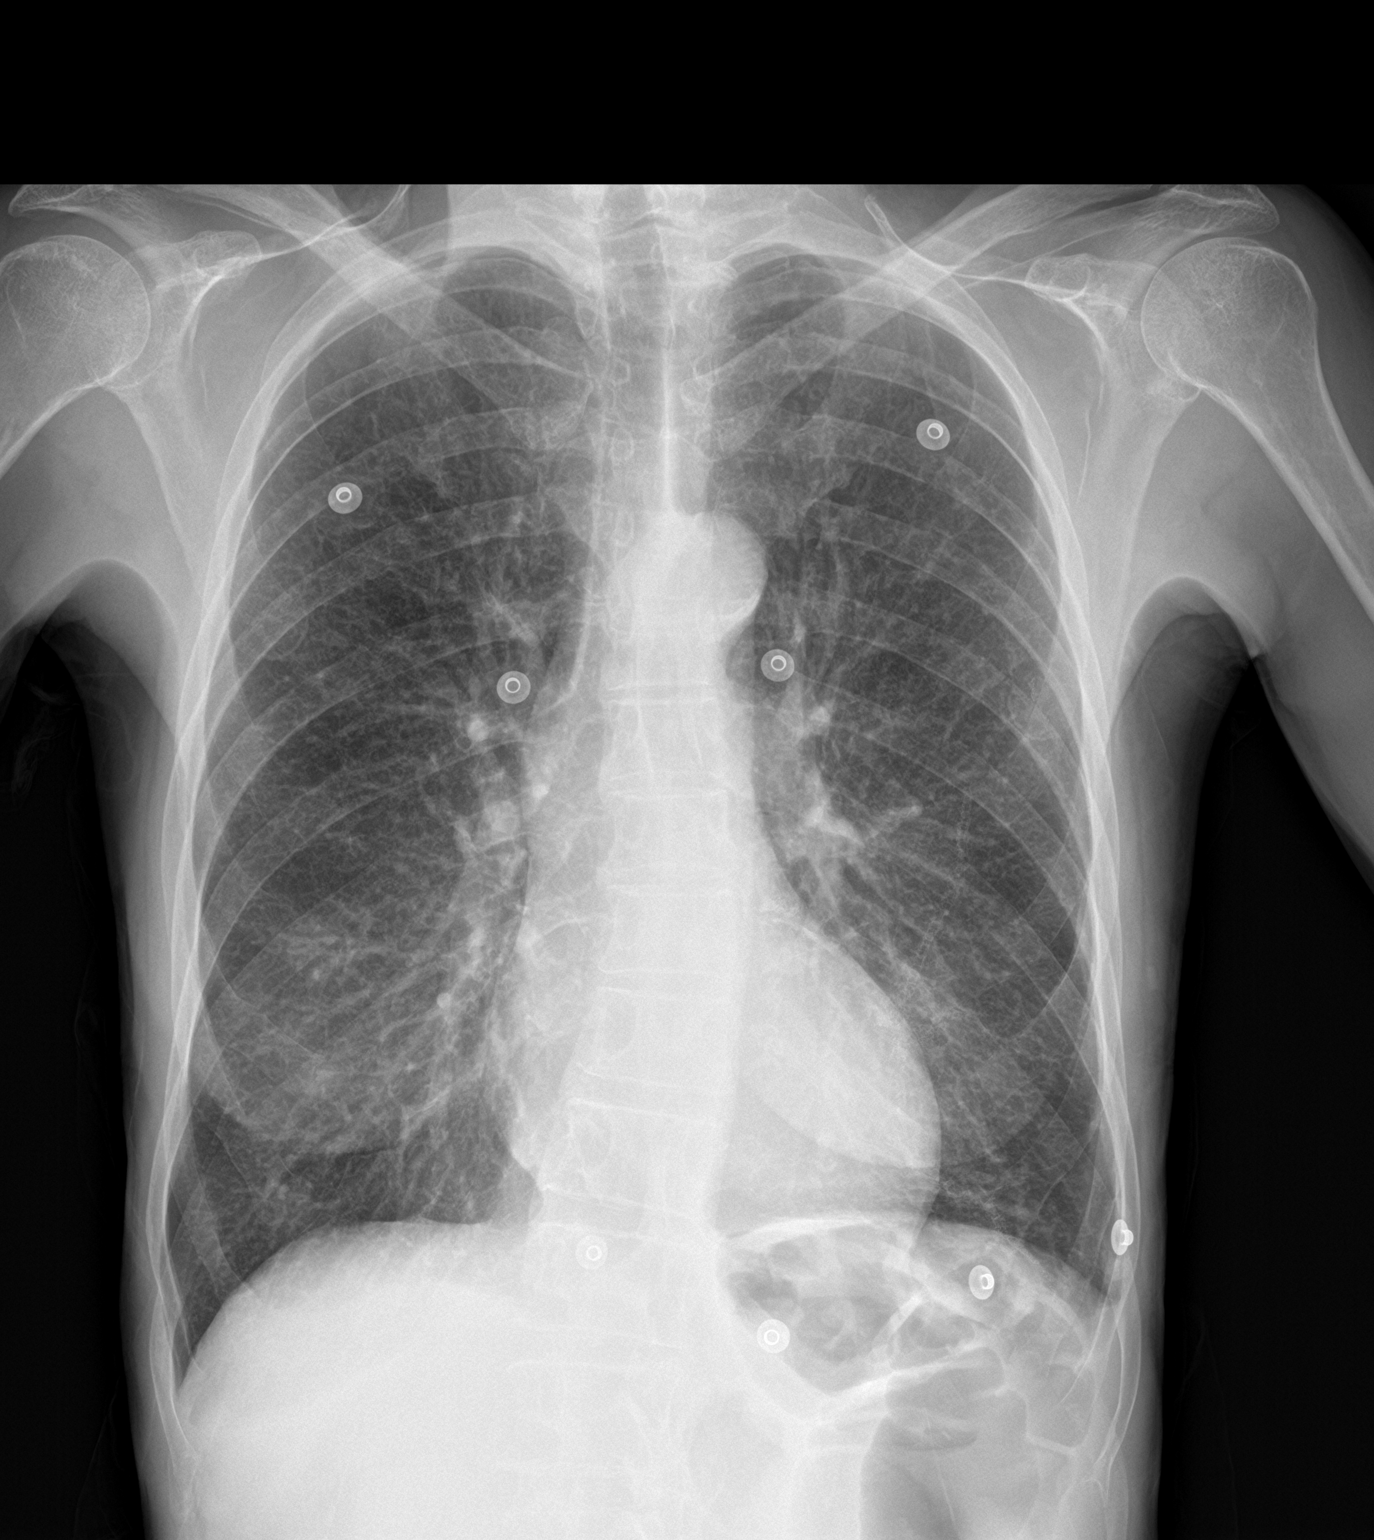

[chest lat]
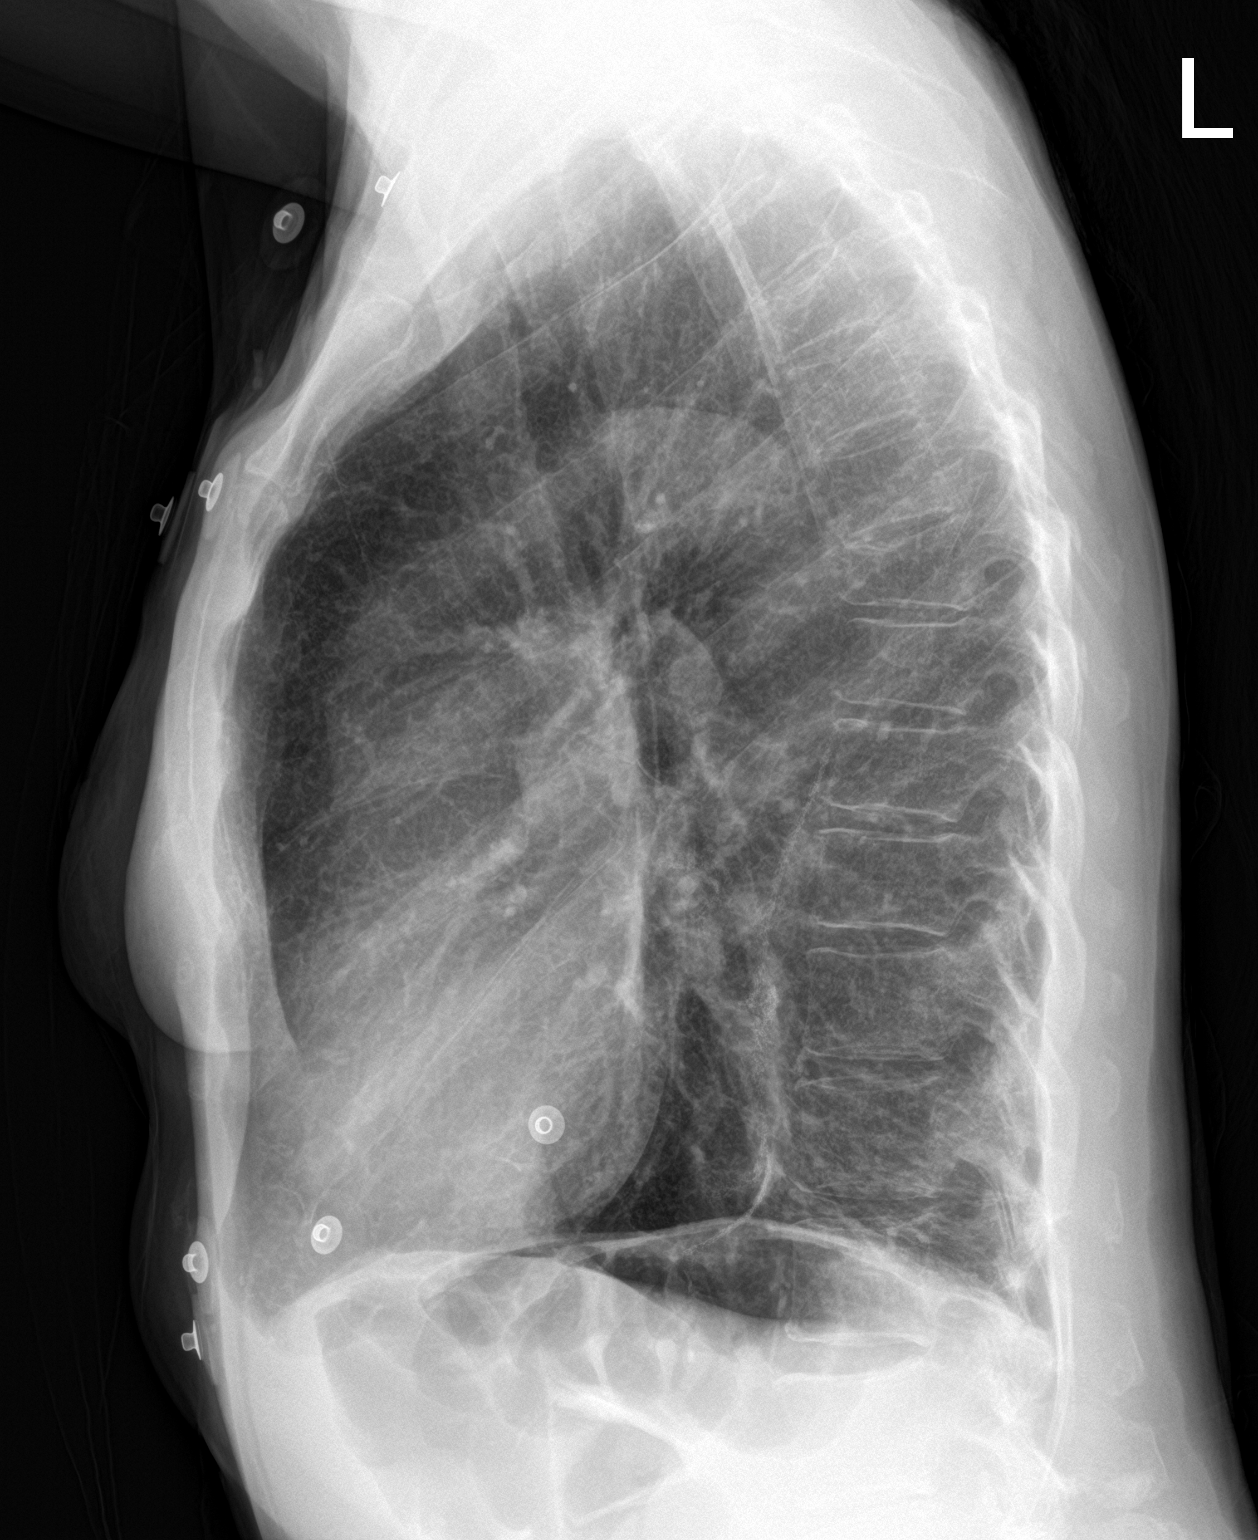

[2 of 2 positions shown; findings below may reference images not displayed]

FINDINGS: Chronic hyperinflation. Mild bronchial thickening with slight
increase from prior exam. No acute airspace disease. Chain sutures
at the left lung base with ill-defined streaky opacities, improved
from prior radiograph. The heart is normal in size. Normal
mediastinal contours with aortic atherosclerosis. No pleural
effusion or pneumothorax. Chronic compression fractures in the mid
and upper thoracic spine. No acute osseous abnormalities are seen.
IMPRESSION: 1. Chronic hyperinflation. Bronchial thickening is increased from
prior exam.
2. Postsurgical change at the left lung base with residual streaky
opacities.
3. No evidence of acute airspace disease.

## 2023-09-12 IMAGING — MR MR LUMBAR SPINE WO/W CM
4 of 7 series · 17 of 48 positions shown · IV contrast (Yes   GAD)
Comparison: Lumbar spine MRI 01/02/2016, CT abdomen/pelvis obtained
earlier the same day, CT abdomen/pelvis 11/19/2020, 03/04/2019

CLINICAL DATA: History of substance abuse common back pain
radiating into the hips

EXAM:
MRI LUMBAR SPINE WITHOUT AND WITH CONTRAST
TECHNIQUE: Multiplanar and multiecho pulse sequences of the lumbar spine were
obtained without and with intravenous contrast.
CONTRAST:  5mL GADAVIST GADOBUTROL 1 MMOL/ML IV SOLN

[Series 4: T2 · sagittal · 4.0mm · 0.55mm/px · 4 of 14 slices shown (1 of 2)]
[im 1/14]
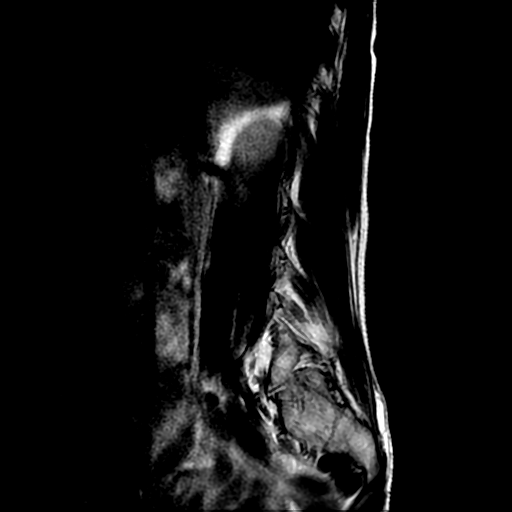
[im 5/14]
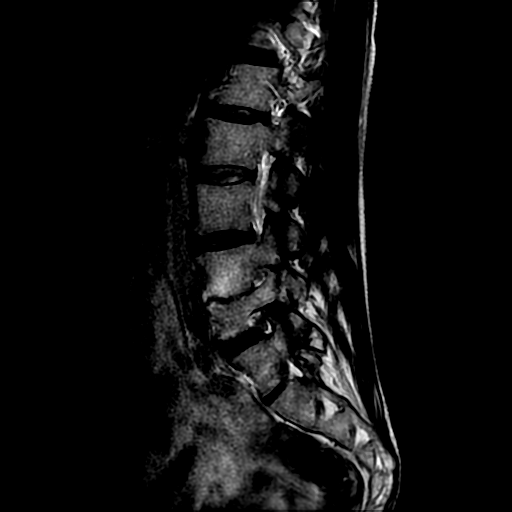
[im 9/14]
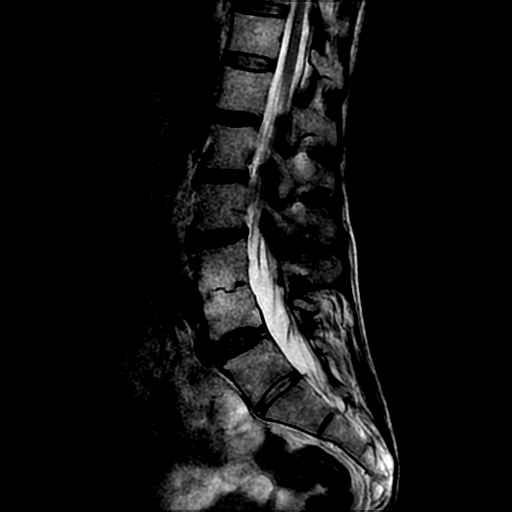
[im 14/14]
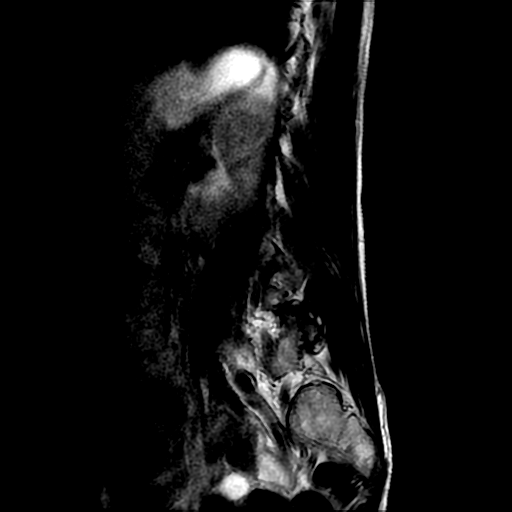

[Series 5: T1 · sagittal · 4.0mm · 0.55mm/px · 3 of 14 slices shown]
[im 1/14]
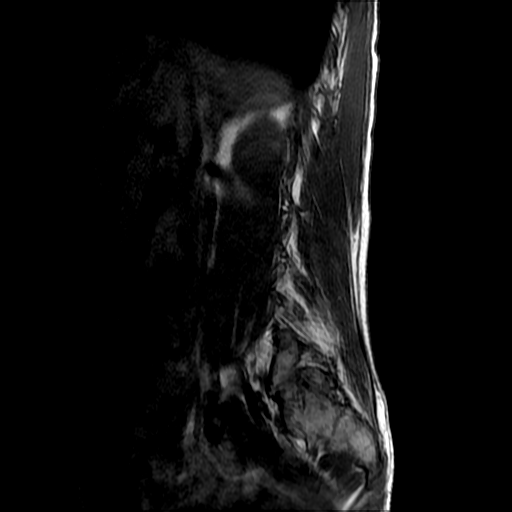
[im 9/14]
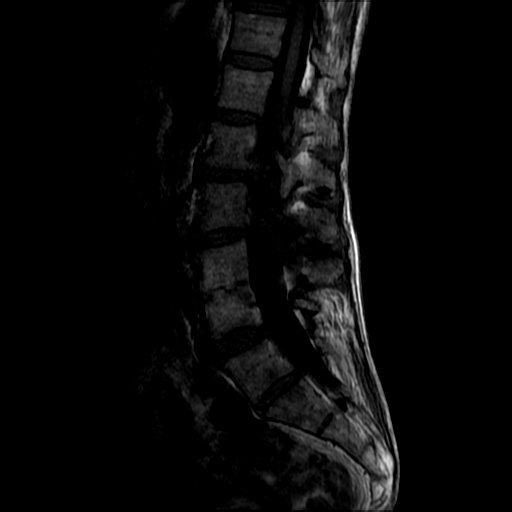
[im 14/14]
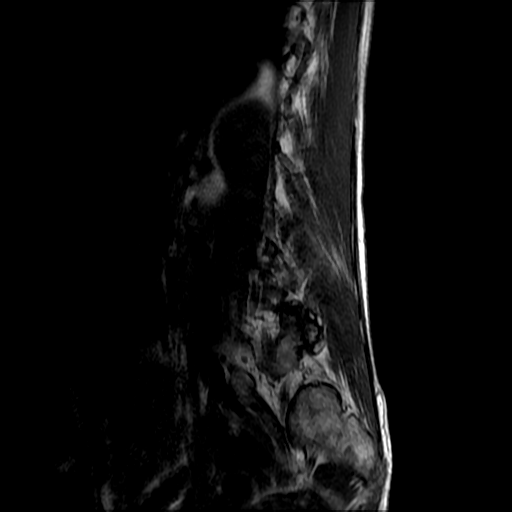

[Series 8: T2 · axial · 4.0mm · 0.41mm/px · z∈[-145,-17]mm · 7 of 30 slices shown (2 of 2)]
[im 1/30]
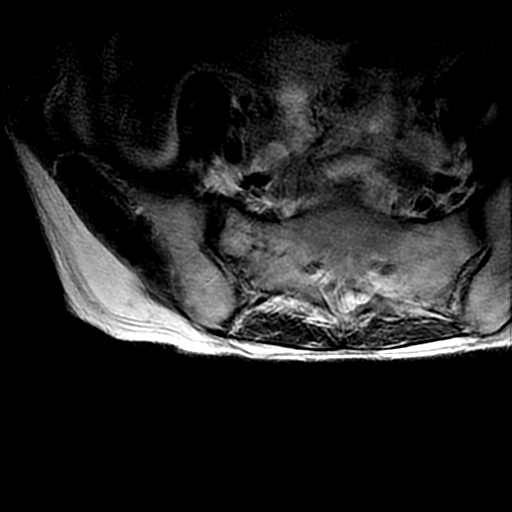
[im 4/30]
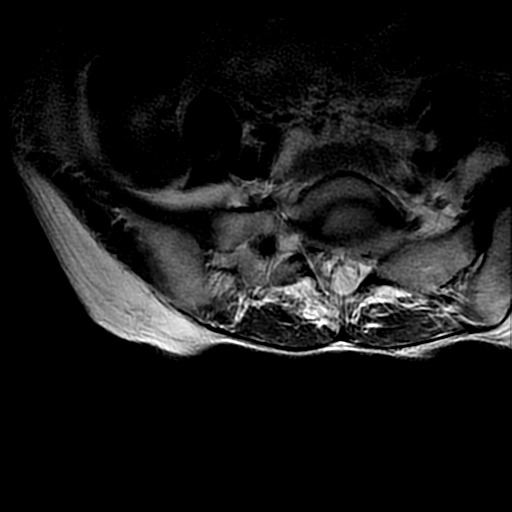
[im 10/30]
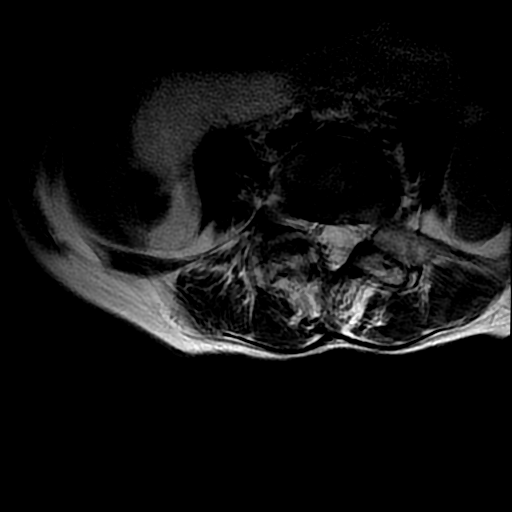
[im 13/30]
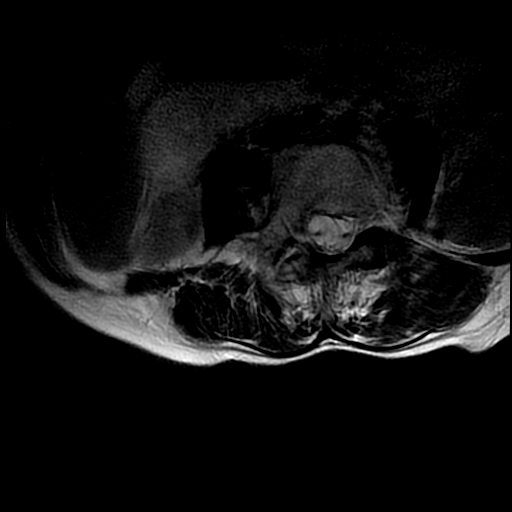
[im 17/30]
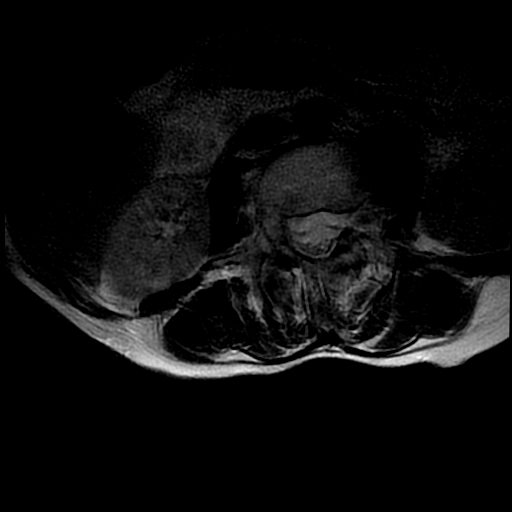
[im 20/30]
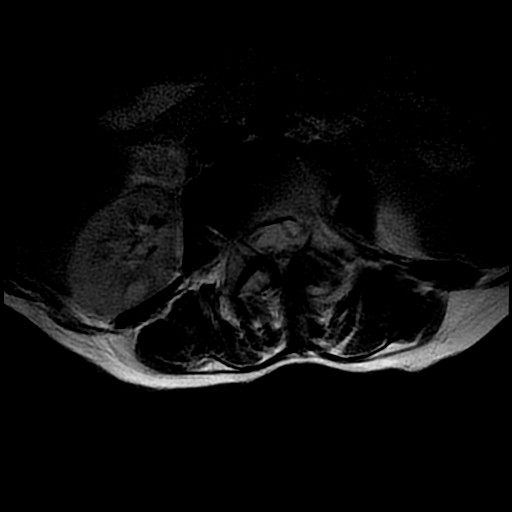
[im 26/30]
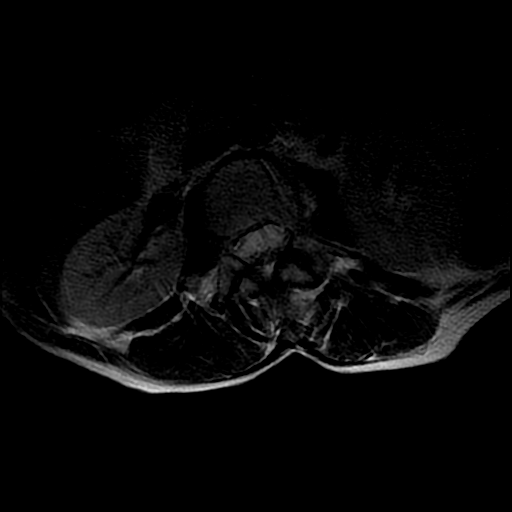

[Series 10: T1 post-contrast · sagittal · 4.0mm · 0.55mm/px · 3 of 14 slices shown]
[im 1/14]
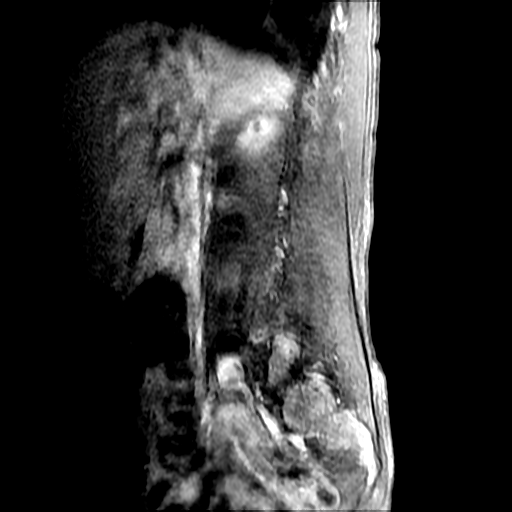
[im 7/14]
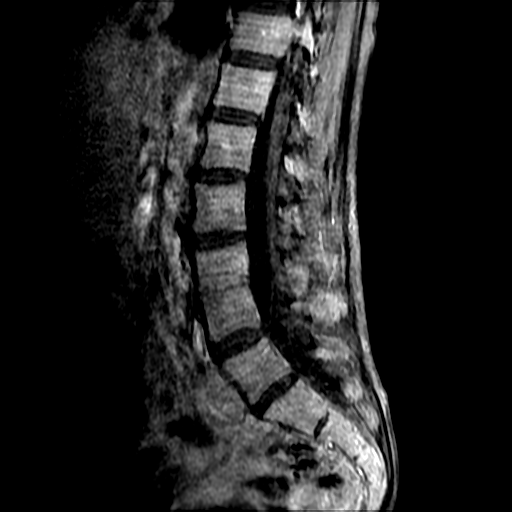
[im 14/14]
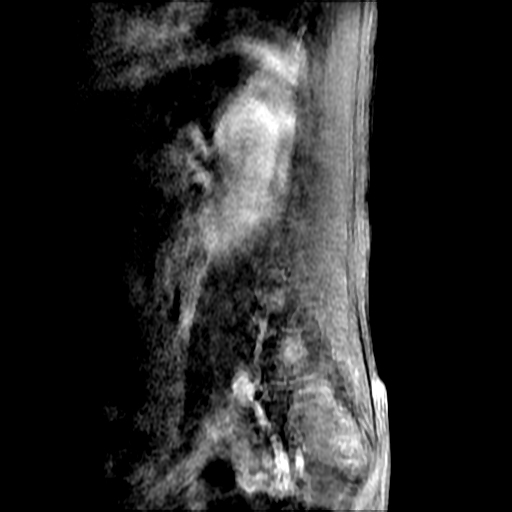

[17 of 48 positions shown; findings below may reference images not displayed]

FINDINGS: Segmentation: Partial lumbarization of the L5 vertebral body is
again seen. The lowest formed disc space is designated L5-S1. This
is in keeping with the prior MRI report from 1715.

Alignment: There is dextrocurvature centered at L2. There is no
antero or retrolisthesis.

Vertebrae: There is mild T1 and T2 marrow signal abnormality in the
L3 and L4 vertebral bodies without marrow edema. There is mild
endplate irregularity. The postcontrast images are moderately motion
degraded with poor fat saturation, but there is no convincing
enhancement of the vertebral bodies.

There is mild STIR signal abnormality in the posterior elements on
the left at L2 and L3 and bilaterally at L4.

Marrow signal is otherwise normal.

Conus medullaris and cauda equina: Conus extends to the mid L1
level. Conus and cauda equina appear normal.

Paraspinal and other soft tissues: A left renal cyst is noted. There
is STIR signal abnormality in the left perifacetal soft tissues at
L2-L3. There is associated postcontrast enhancement (10-11).

Disc levels:

There is complete obliteration of the disc space at L3-L4 which is
unchanged since 11/19/2020 but new since 03/04/2019. The other disc
spaces are preserved.

T12-L1: No significant spinal canal or neural foraminal stenosis

L1-L2: No significant spinal canal or neural foraminal stenosis.

L2-L3: There is a mild disc bulge without significant spinal canal
or neural foraminal stenosis.

L3-L4: There is mild degenerative endplate change and facet
arthropathy resulting mild right and no significant left neural
foraminal stenosis and no significant spinal canal stenosis

L4-L5: There is a minimal disc bulge and advanced bilateral facet
arthropathy without significant spinal canal or neural foraminal
stenosis.

L5-S1: No significant spinal canal or neural foraminal stenosis.
IMPRESSION: 1. Motion degraded study particularly affecting the sagittal T1
postcontrast images.
2. Transitional anatomy as above. The lowest formed disc space is
designated L5-S1 with sacralization of the L5 vertebral body.
3. Irregularity of the left L2-L3 facet joint with associated marrow
edema in the posterior elements and perifacetal soft tissue edema
and enhancement. While this could be degenerative in nature, the
finding is suspicious for septic arthritis, and when correlated with
prior CT abdomen/pelvis, the irregularity of the facet joint has
increased from 11/19/2020 to today. Correlate with symptoms and lab
values.
4. Obliteration of the L3-L4 disc space with mild endplate
irregularity may reflect sequela of prior infection. No marrow edema
or convincing marrow enhancement to suggest discitis/osteomyelitis
at this level on the current study.
5. Facet arthropathy most advanced at L3-L4 and L4-L5. Otherwise,
mild multilevel degenerative changes without high-grade spinal canal
or neural foraminal stenosis are detailed above.

## 2023-11-19 IMAGING — CT CT T SPINE W/O CM
3 series · 12 of 33 positions shown, 14 images · non-contrast
Comparison: Radiography 05/02/2014

CLINICAL DATA: Back pain. Fever. Recent fall. History of spinal
infection.

EXAM:
CT THORACIC SPINE WITHOUT CONTRAST
TECHNIQUE: Multidetector CT images of the thoracic were obtained using the
standard protocol without intravenous contrast.
RADIATION DOSE REDUCTION: This exam was performed according to the
departmental dose-optimization program which includes automated
exposure control, adjustment of the mA and/or kV according to
patient size and/or use of iterative reconstruction technique.

[Series 5: t spine soft · axial · 0.27mm/px · z∈[-685,-505]mm · 4 of 131 slices shown, 5 images]
[im 21/131  soft-tissue]
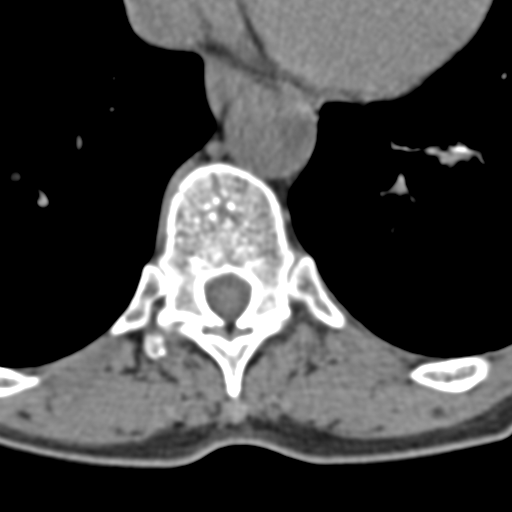
[im 21/131  bone]
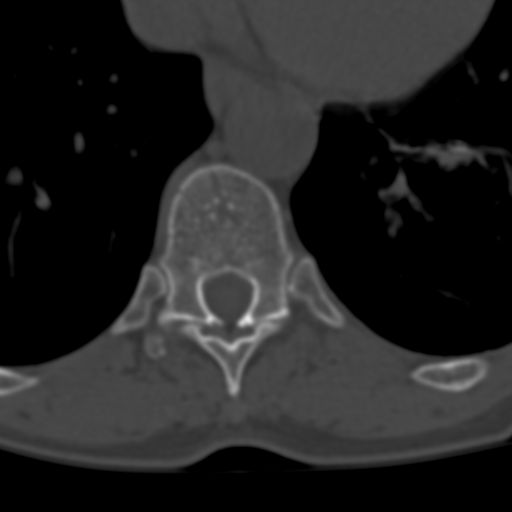
[im 51/131  bone]
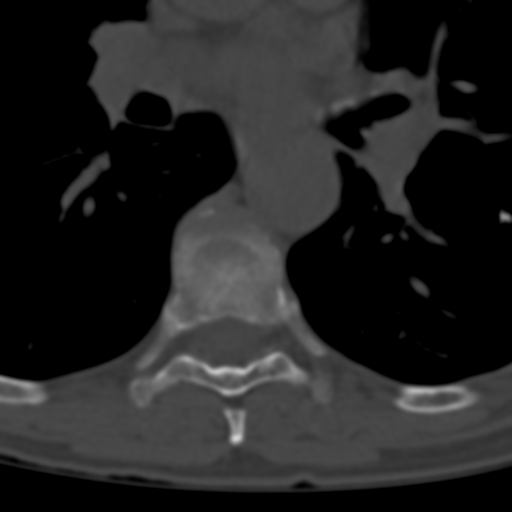
[im 81/131  bone]
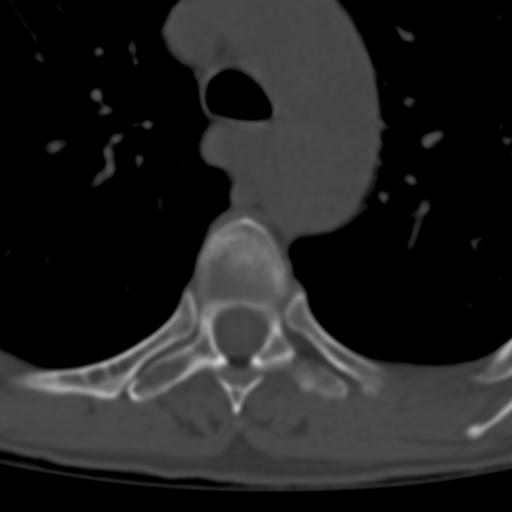
[im 111/131  bone]
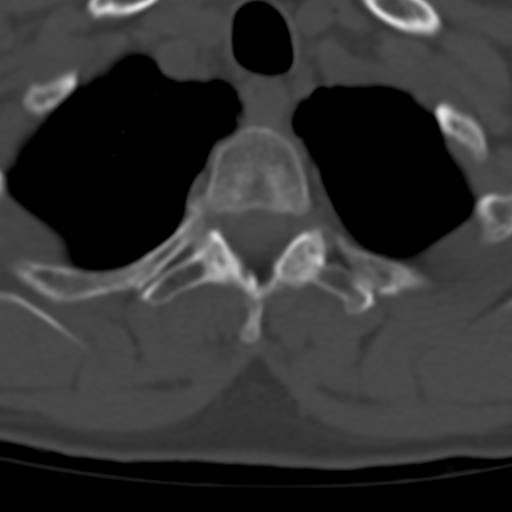

[Series 6: cor bone · coronal · 0.28mm/px · 3 of 67 slices shown]
[im 14/67  bone]
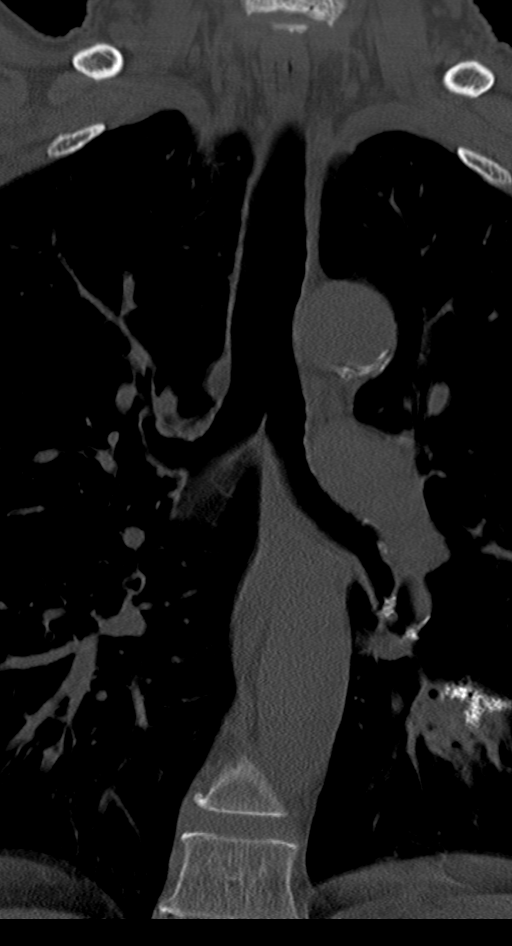
[im 27/67  bone]
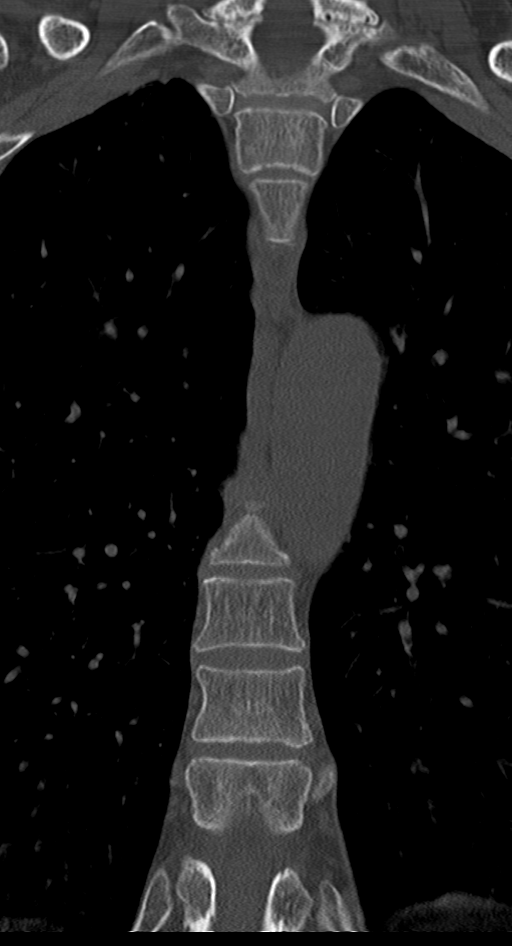
[im 40/67  bone]
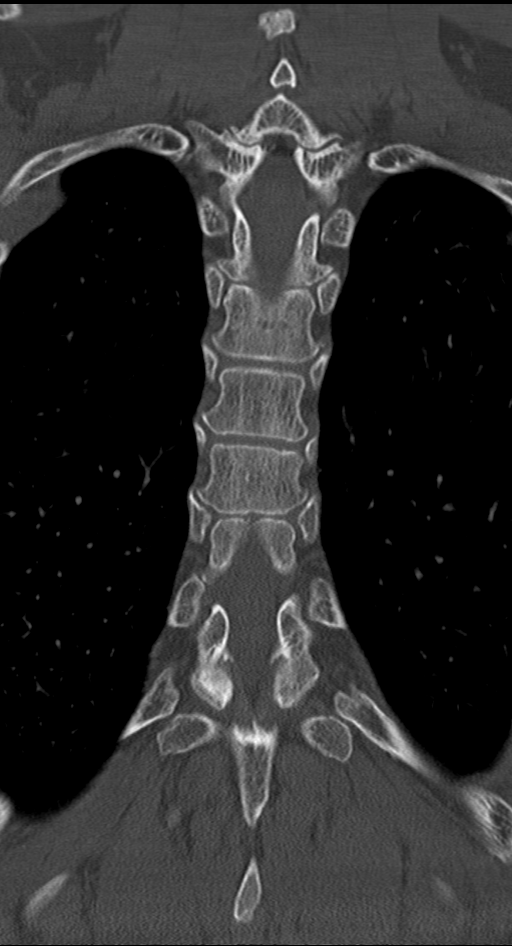

[Series 7: sag bone · sagittal · 0.34mm/px · 5 of 61 slices shown, 6 images]
[im 21/61  bone]
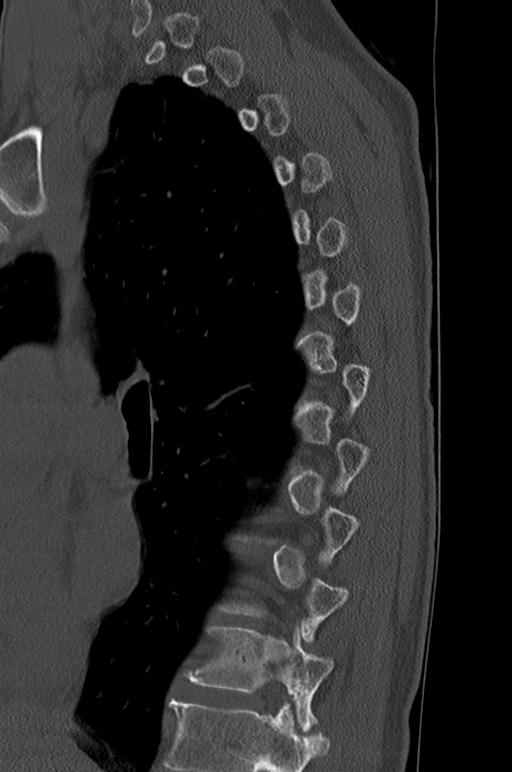
[im 26/61  bone]
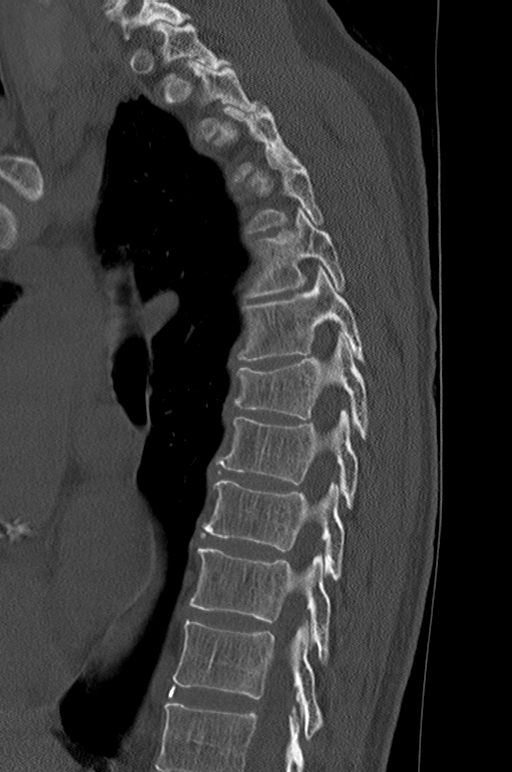
[im 31/61  soft-tissue]
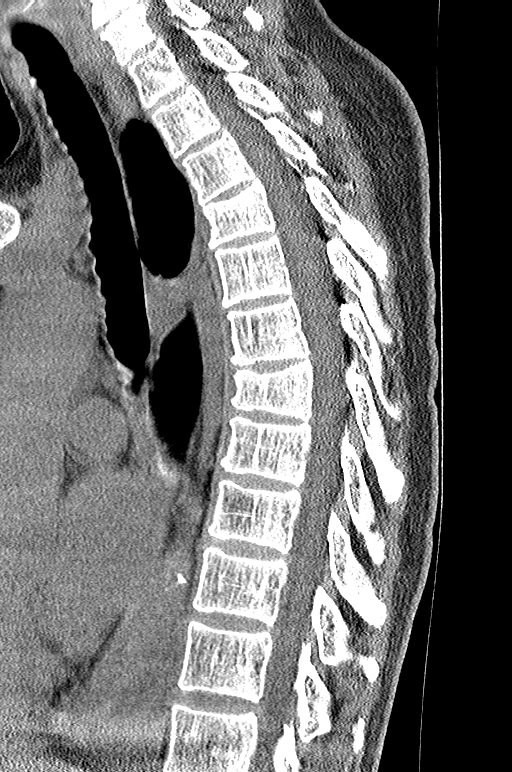
[im 31/61  bone]
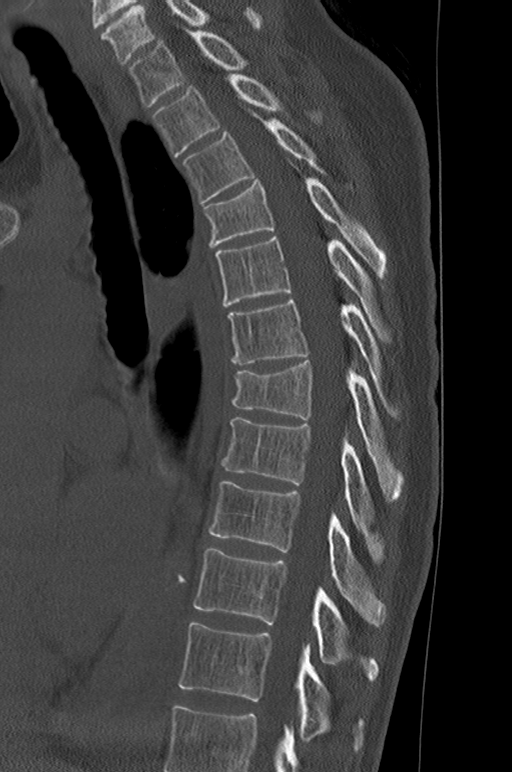
[im 36/61  bone]
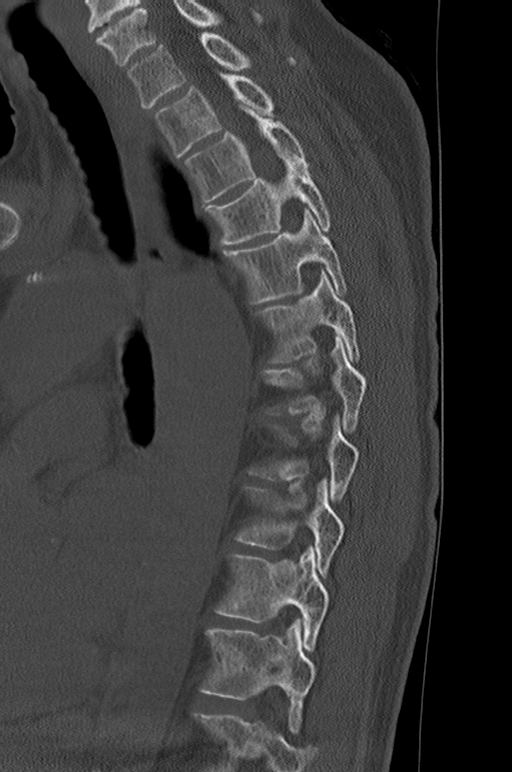
[im 41/61  bone]
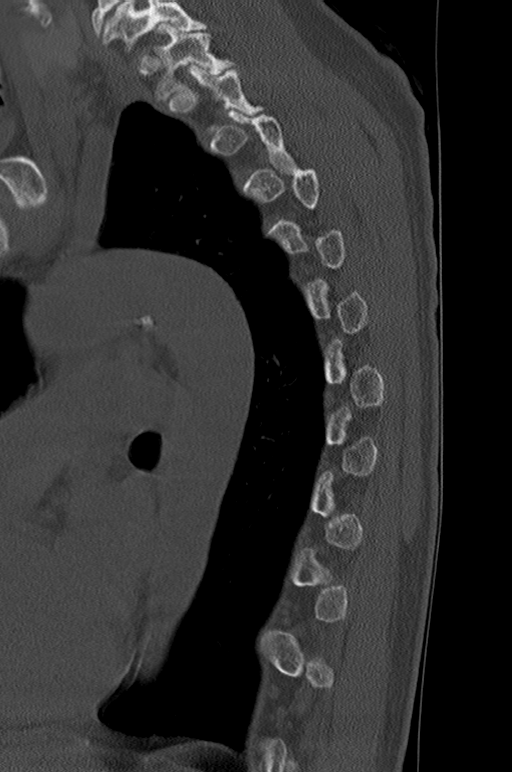

[12 of 33 positions shown; findings below may reference images not displayed]

FINDINGS: Alignment: Mild scoliotic curvature convex to the right.

Vertebrae: Old healed partial compression deformities at T4 and T7.
No retropulsed bone.

Paraspinal and other soft tissues: Negative

Disc levels: T1-2: Facet osteoarthritis. Mild bilateral foraminal
narrowing.

T2-3: Facet osteoarthritis.  Mild bilateral foraminal narrowing.

T3-4: Chronic fusion across the facet joints. No canal or foraminal
stenosis.

T4-5 through T10-11: Normal. No disc or facet pathology. No canal or
foraminal stenosis.

T11-12: Minimal disc bulge. Bilateral facet osteoarthritis right
worse than left.
IMPRESSION: No evidence of active spinal infection in the thoracic region. Old
healed partial compression fractures at T4 and T7.

Facet osteoarthritis at T1-2, T2-3 and T11-12, which could relate to
regional pain. No sign that this is due to infection.

Chronic fusion across the facets at the T3-4 level.

## 2023-11-19 IMAGING — MR MR LUMBAR SPINE WO/W CM
4 of 7 series · 23 of 48 positions shown · IV contrast (gadavist)
Comparison: MRI lumbar spine 04/16/2021, no prior MRI of the
thoracic spine;

CLINICAL DATA: Chronic severe mid and low back pain, concern for
osteomyelitis

EXAM:
MRI THORACIC AND LUMBAR SPINE WITHOUT AND WITH CONTRAST
TECHNIQUE: Multiplanar and multiecho pulse sequences of the thoracic and lumbar
spine were obtained without and with intravenous contrast.
CONTRAST:  5mL GADAVIST GADOBUTROL 1 MMOL/ML IV SOLN

[Series 25: T2 · sagittal · 4.0mm · 0.73mm/px · 4 of 18 slices shown (1 of 2)]
[im 1/18]
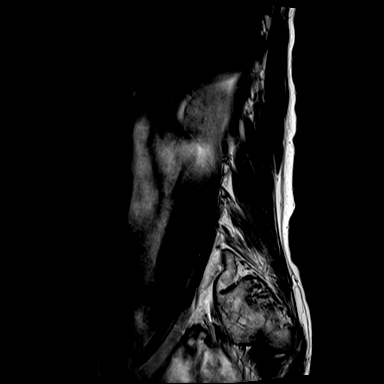
[im 6/18]
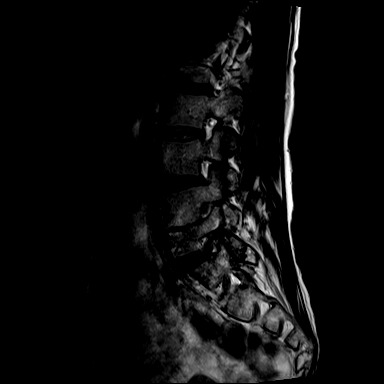
[im 12/18]
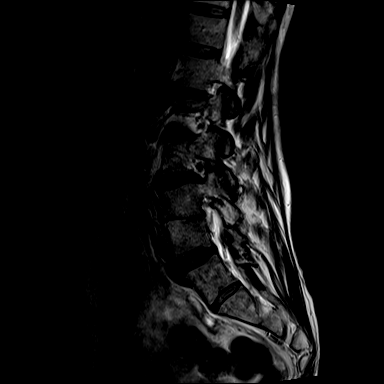
[im 18/18]
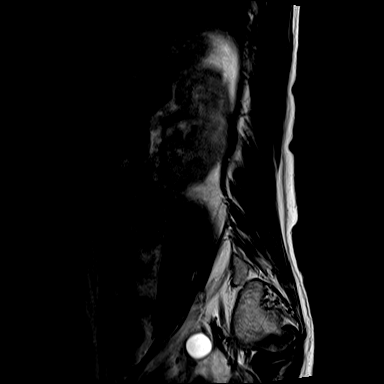

[Series 27: T1 · sagittal · 4.0mm · 0.88mm/px · 5 of 18 slices shown (1 of 2)]
[im 1/18]
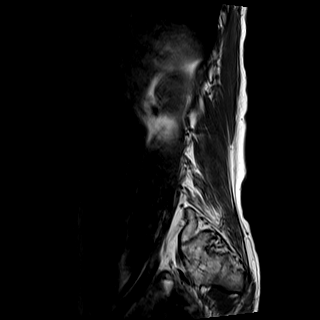
[im 5/18]
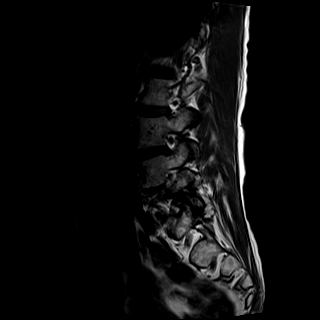
[im 9/18]
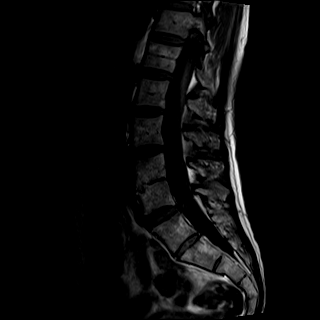
[im 13/18]
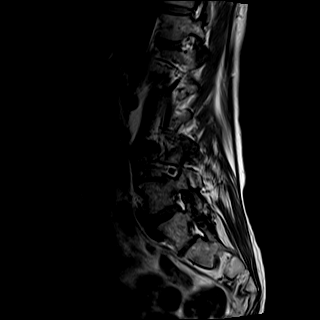
[im 18/18]
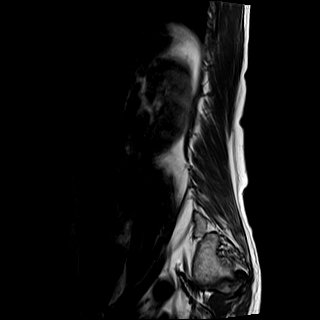

[Series 28: T2 · axial · 4.0mm · 0.57mm/px · z∈[-481,-269]mm · 8 of 36 slices shown (2 of 2)]
[im 1/36]
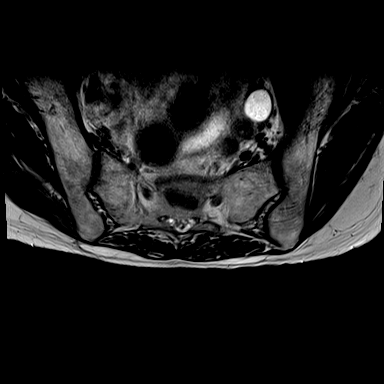
[im 4/36]
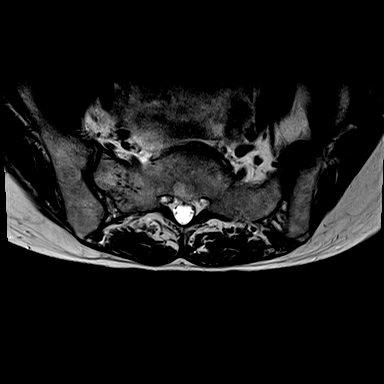
[im 12/36]
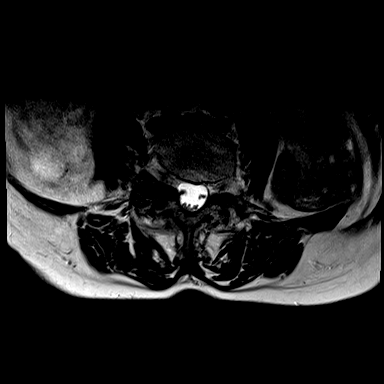
[im 16/36]
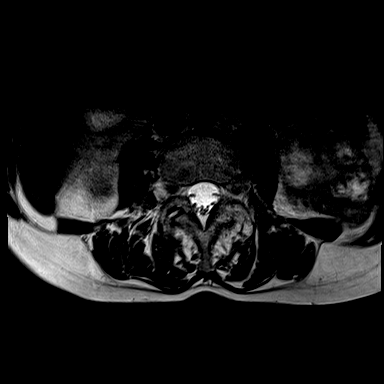
[im 20/36]
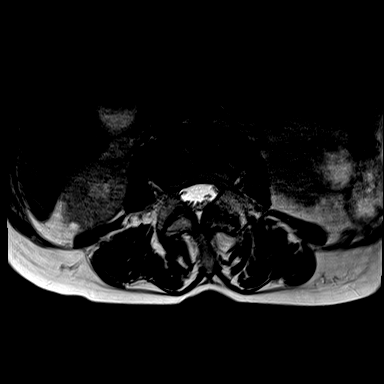
[im 24/36]
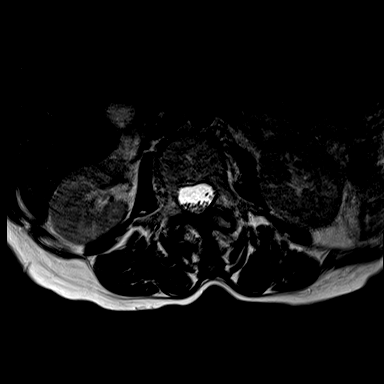
[im 32/36]
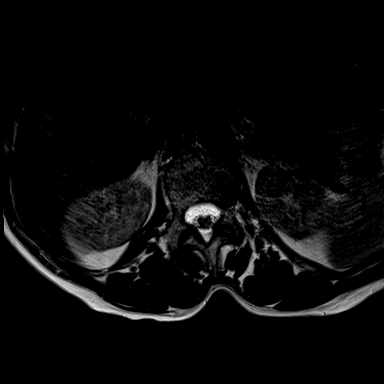
[im 36/36]
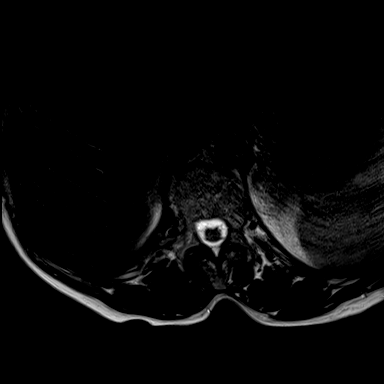

[Series 29: T1 · axial · 4.0mm · 0.34mm/px · z∈[-481,-289]mm · 6 of 36 slices shown (2 of 2)]
[im 1/36]
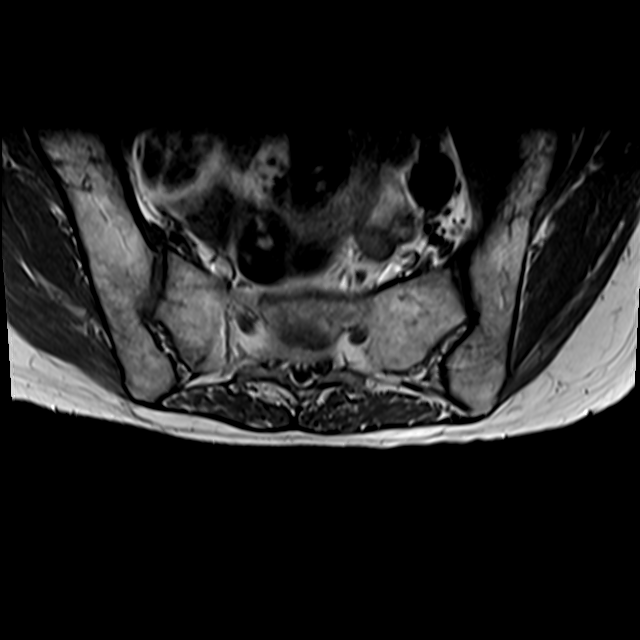
[im 4/36]
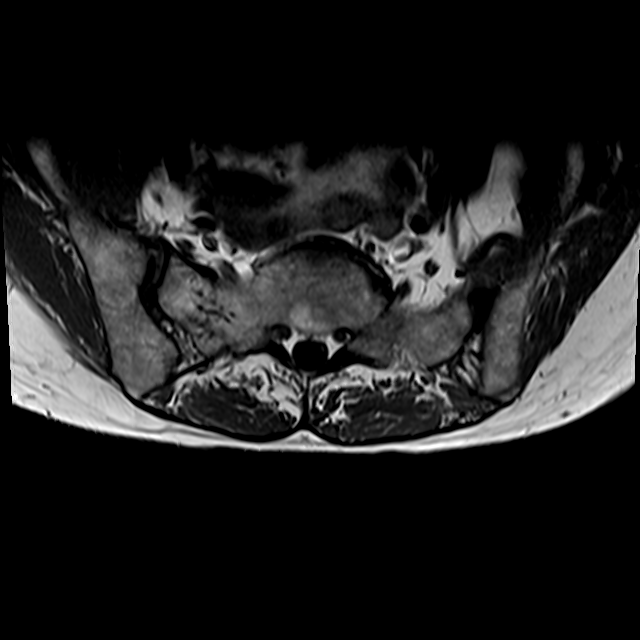
[im 12/36]
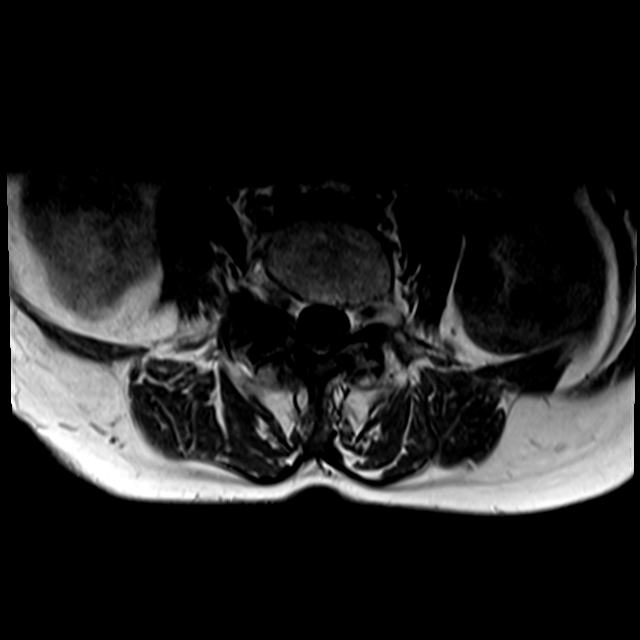
[im 16/36]
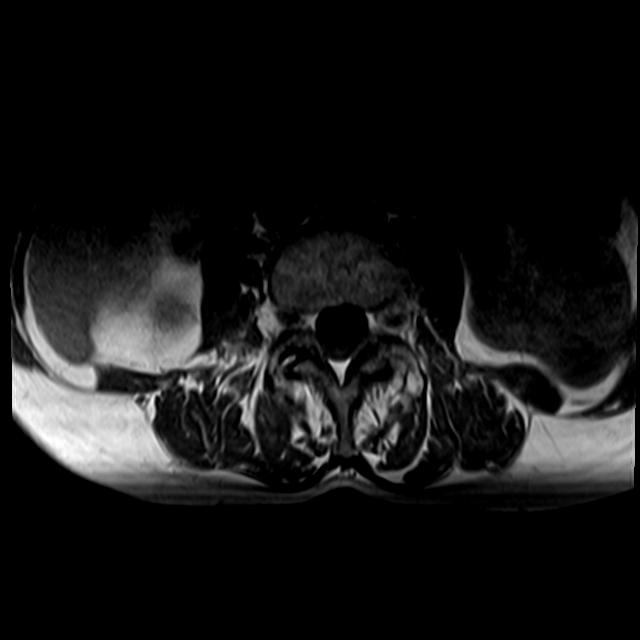
[im 20/36]
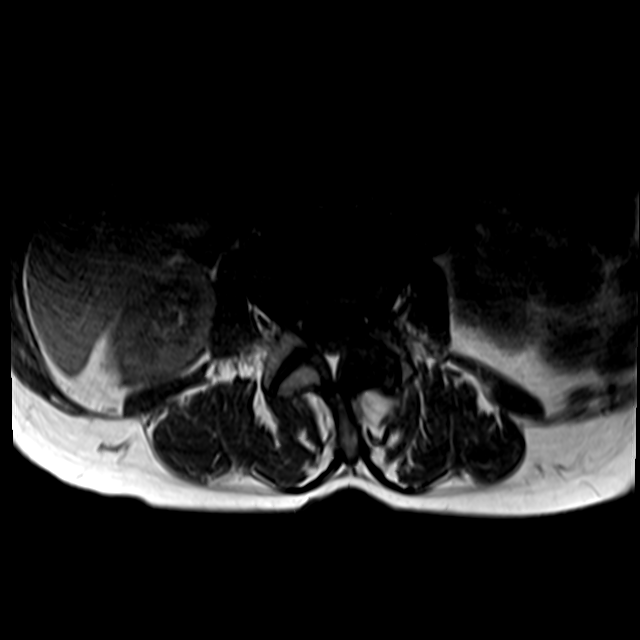
[im 32/36]
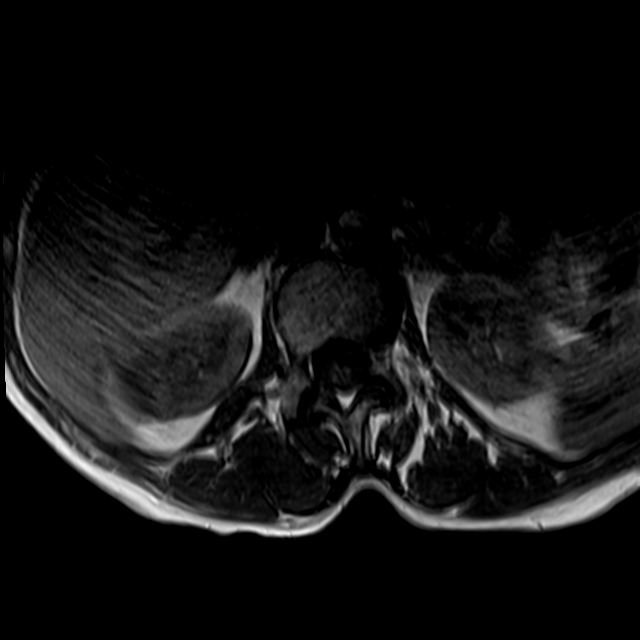

[23 of 48 positions shown; findings below may reference images not displayed]

correlation is made with CT thoracic and lumbar
spine 06/23/2021 trauma CT chest 11/19/2020 and CT abdomen pelvis
04/16/2021.
FINDINGS: Evaluation is somewhat limited by motion artifact.

MRI THORACIC SPINE FINDINGS

Alignment: S shaped curvature of the thoracolumbar spine. No
listhesis.

Vertebrae: No acute fracture or suspicious osseous lesion. No
abnormal enhancement. Benign hemangioma in the T9 vertebral body.
Wedging of T4 and T7 appears unchanged compared to 11/19/2020.

Cord:  Normal signal and morphology.

Paraspinal and other soft tissues: Solid and possibly enhancing
nodules in the inferior right upper lobe (series 32, image 17) and
superior left lower lobe (series 18, image 19), which measure
approximately 11 x 6 mm and 5 x 5 mm, respectively, and were not
present on the 11/19/2020 chest CT. No pleural effusion. Soft
tissues are otherwise unremarkable.

Disc levels: No spinal canal stenosis. Mild bilateral neural
foraminal narrowing at T2-T3.

MRI LUMBAR SPINE FINDINGS

Segmentation: Partial lumbarization of L5, in keeping with the
numbering on the prior exam.

Alignment: S shaped curvature of the thoracolumbar spine. No
significant listhesis.

Vertebrae: Previously noted increased T2 signal at L4-L5 is
decreased in conspicuity. Partial fusion of L4 and L5. No definite
abnormal enhancement, although the postcontrast sequence is
significantly limited by motion. No suspicious osseous lesion.

Conus medullaris: Extends to the L2-L3 level and appears normal. No
abnormal enhancement.

Paraspinal and other soft tissues: Left-greater-than-right renal
cysts. Possible left adnexal cyst, which measures up to 1.9 cm.

Disc levels:

T12-L1: No significant disc bulge. No spinal canal stenosis or
neural foraminal narrowing.

L1-L2: No significant disc bulge. No spinal canal stenosis or neural
foraminal narrowing.

L2-L3: Minimal disc bulge. No spinal canal stenosis or neural
foraminal narrowing.

L3-L4: Mild disc bulge. Left-greater-than-right facet arthropathy.
No spinal canal stenosis. Mild left neural foraminal narrowing.

L4-L5: Osseous fusion across the disc space. Moderate bilateral
facet arthropathy, with some widening of the right facets. No spinal
canal stenosis. Mild right neural foraminal narrowing.

L5-S1: Mild disc bulge. Moderate facet arthropathy. No spinal canal
stenosis. Moderate right neural foraminal narrowing.
IMPRESSION: 1. No evidence of discitis or osteomyelitis in the thoracic or
lumbar spine. No acute fracture.
2. Degenerative changes and scoliosis, which cause mild neural
foraminal narrowing bilaterally at T2-T3, on the left at L3-L4, and
on the right at L4-L5 and L5-S1. No spinal canal stenosis.
3. Pulmonary nodules in the right upper and left lower lobe, which
were not present on 11/19/2020; these are poorly visualized on MRI.
Recommend CT chest for further evaluation.

These results were called by telephone at the time of interpretation
on 06/23/2021 at [DATE] to provider DR. SUJOL, who verbally
acknowledged these results.

## 2023-11-19 IMAGING — MR MR THORACIC SPINE WO/W CM
5 of 9 series · 22 of 48 positions shown · IV contrast (gadavist)
Comparison: MRI lumbar spine 04/16/2021, no prior MRI of the
thoracic spine;

CLINICAL DATA: Chronic severe mid and low back pain, concern for
osteomyelitis

EXAM:
MRI THORACIC AND LUMBAR SPINE WITHOUT AND WITH CONTRAST
TECHNIQUE: Multiplanar and multiecho pulse sequences of the thoracic and lumbar
spine were obtained without and with intravenous contrast.
CONTRAST:  5mL GADAVIST GADOBUTROL 1 MMOL/ML IV SOLN

[Series 14: T1 · sagittal · 6.0mm · 1.23mm/px · 1 of 9 slices shown (1 of 3)]
[im 1/9]
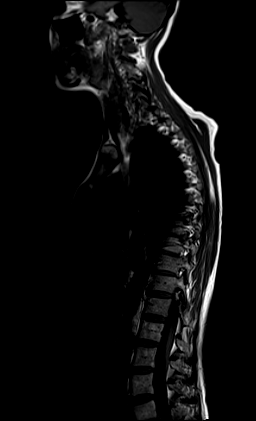

[Series 15: T2 · sagittal · 3.0mm · 0.74mm/px · 3 of 18 slices shown (1 of 2)]
[im 1/18]
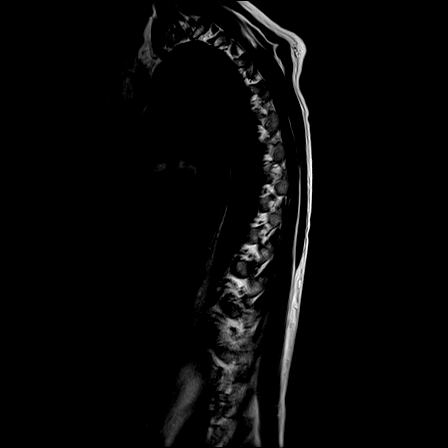
[im 9/18]
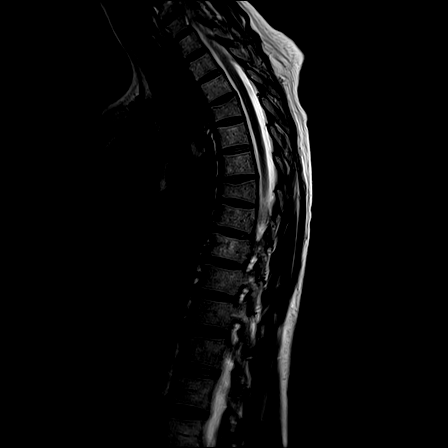
[im 18/18]
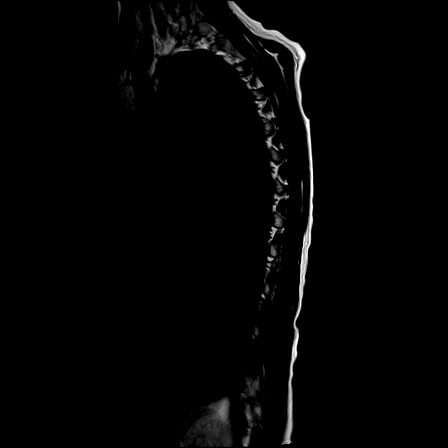

[Series 16: T1 · sagittal · 3.0mm · 0.74mm/px · 4 of 18 slices shown (2 of 3)]
[im 1/18]
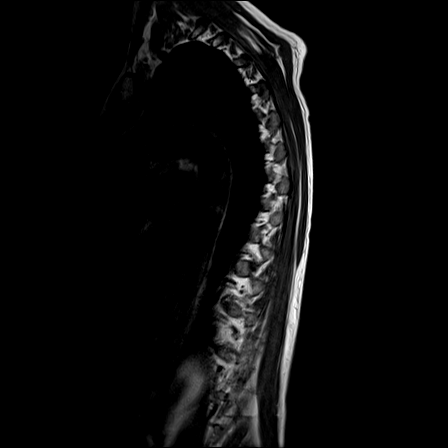
[im 6/18]
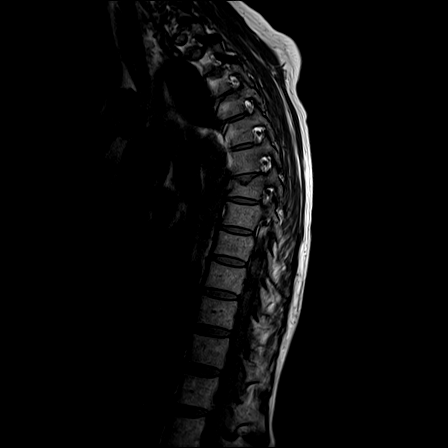
[im 12/18]
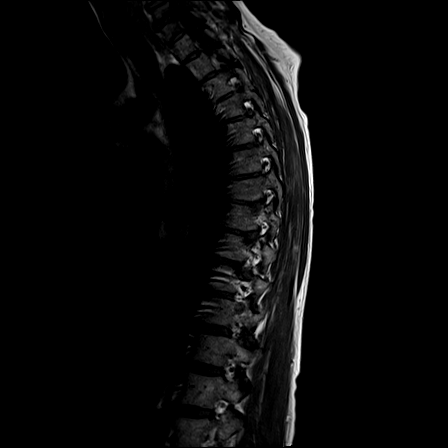
[im 18/18]
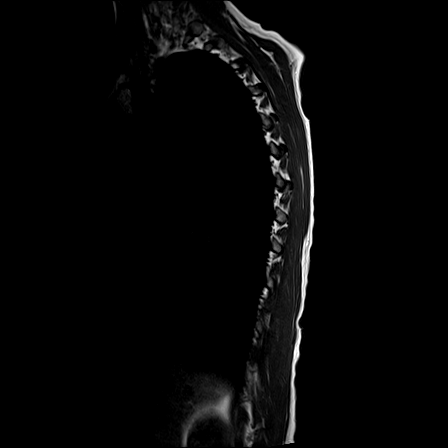

[Series 18: T2 · axial · 4.0mm · 0.59mm/px · z∈[-226,-19]mm · 8 of 39 slices shown (2 of 2)]
[im 1/39]
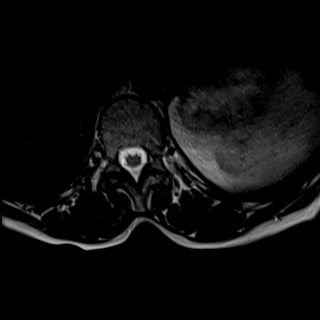
[im 6/39]
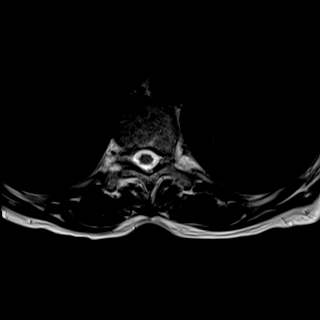
[im 11/39]
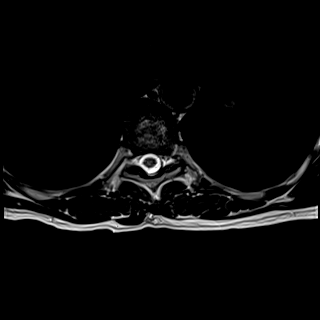
[im 17/39]
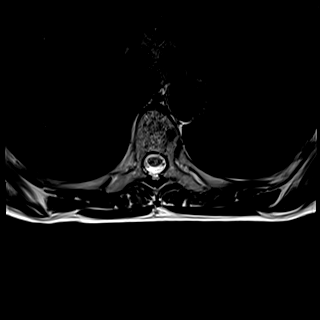
[im 22/39]
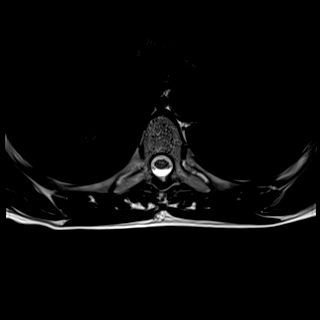
[im 28/39]
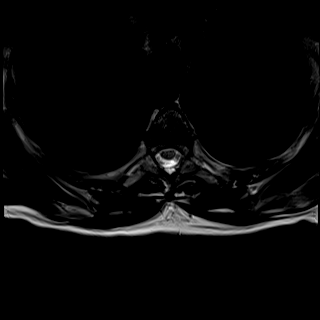
[im 33/39]
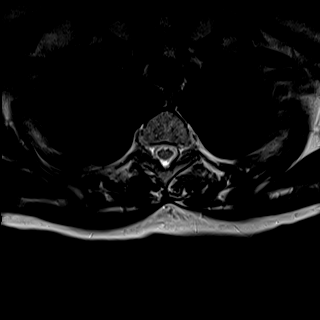
[im 39/39]
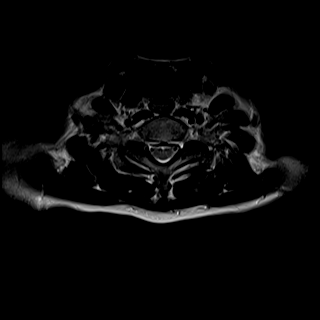

[Series 24: T1 · axial · non-contrast · 4.0mm · 0.31mm/px · z∈[-226,-59]mm · 6 of 39 slices shown (3 of 3)]
[im 1/39]
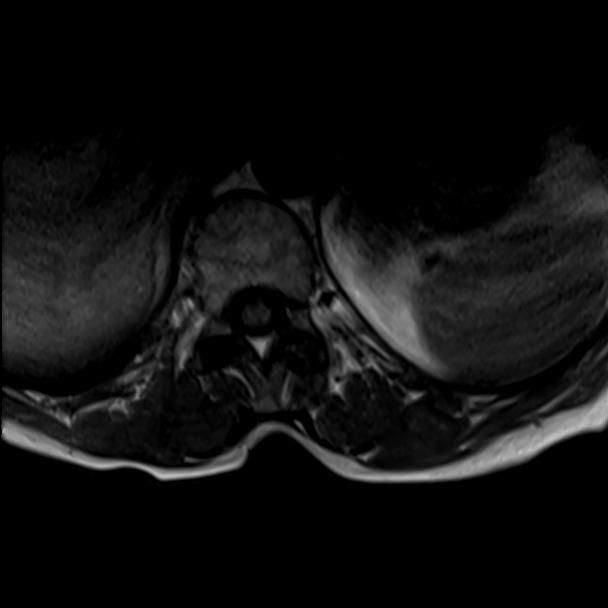
[im 6/39]
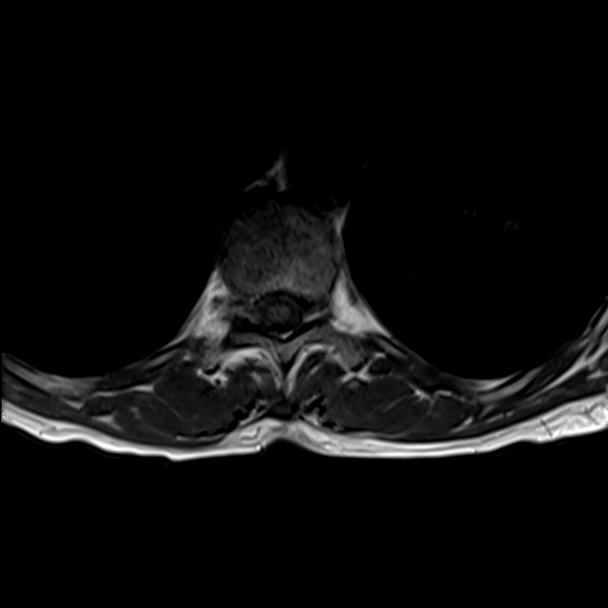
[im 11/39]
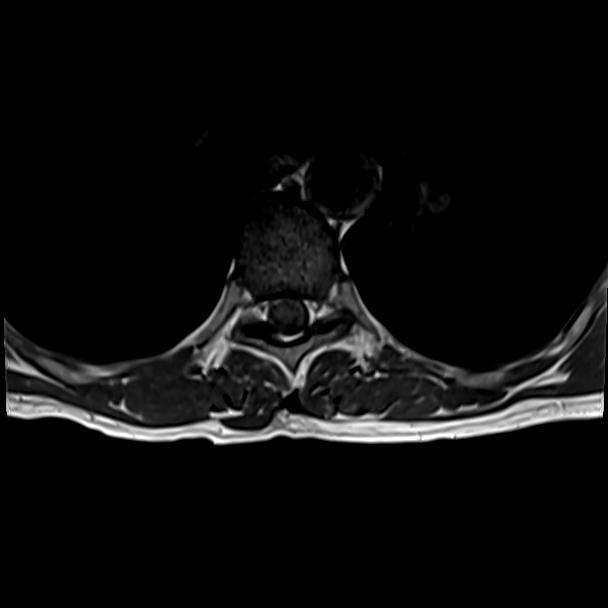
[im 17/39]
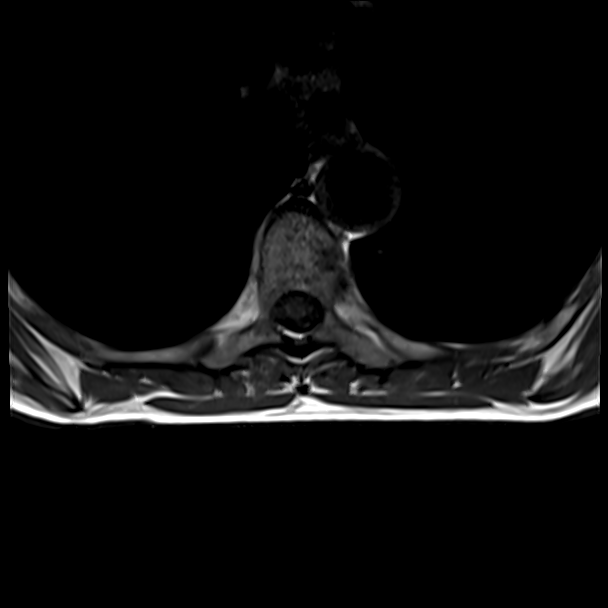
[im 22/39]
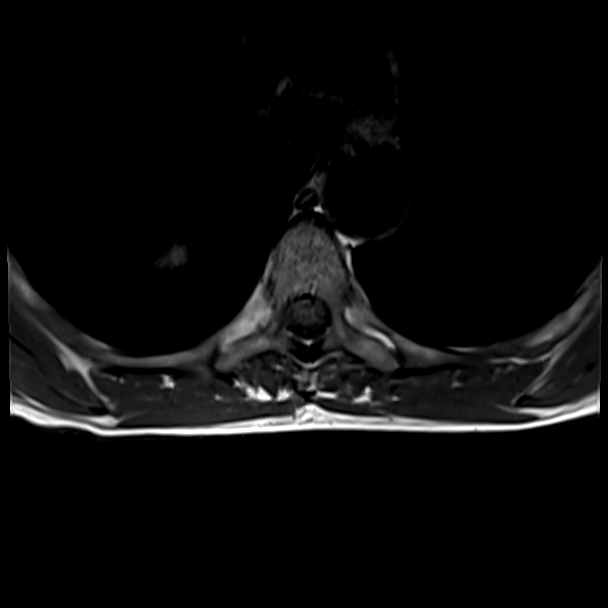
[im 28/39]
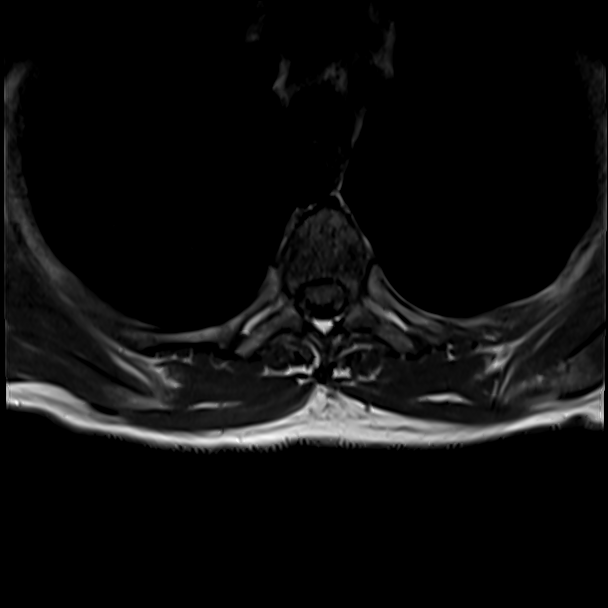

[22 of 48 positions shown; findings below may reference images not displayed]

correlation is made with CT thoracic and lumbar
spine 06/23/2021 trauma CT chest 11/19/2020 [address] CT abdomen pelvis
04/16/2021.
FINDINGS: Evaluation is somewhat limited by motion artifact.

MRI THORACIC SPINE FINDINGS

Alignment: S shaped curvature of the thoracolumbar spine. No
listhesis.

Vertebrae: No acute fracture or suspicious osseous lesion. No
abnormal enhancement. Benign hemangioma in the T9 vertebral body.
Wedging of T4 and T7 appears unchanged compared to 11/19/2020.

Cord:  Normal signal and morphology.

Paraspinal and other soft tissues: Solid and possibly enhancing
nodules in the inferior right upper lobe (series 32, image 17) and
superior left lower lobe (series 18, image 19), which measure
approximately 11 x 6 mm and 5 x 5 mm, respectively, and were not
present on the 11/19/2020 chest CT. No pleural effusion. Soft
tissues are otherwise unremarkable.

Disc levels: No spinal canal stenosis. Mild bilateral neural
foraminal narrowing at T2-T3.

MRI LUMBAR SPINE FINDINGS

Segmentation: Partial lumbarization of L5, in keeping with the
numbering on the prior exam.

Alignment: S shaped curvature of the thoracolumbar spine. No
significant listhesis.

Vertebrae: Previously noted increased T2 signal at L4-L5 is
decreased in conspicuity. Partial fusion of L4 and L5. No definite
abnormal enhancement, although the postcontrast sequence is
significantly limited by motion. No suspicious osseous lesion.

Conus medullaris: Extends to the L2-L3 level and appears normal. No
abnormal enhancement.

Paraspinal and other soft tissues: Left-greater-than-right renal
cysts. Possible left adnexal cyst, which measures up to 1.9 cm.

Disc levels:

T12-L1: No significant disc bulge. No spinal canal stenosis or
neural foraminal narrowing.

L1-L2: No significant disc bulge. No spinal canal stenosis or neural
foraminal narrowing.

L2-L3: Minimal disc bulge. No spinal canal stenosis or neural
foraminal narrowing.

L3-L4: Mild disc bulge. Left-greater-than-right facet arthropathy.
No spinal canal stenosis. Mild left neural foraminal narrowing.

L4-L5: Osseous fusion across the disc space. Moderate bilateral
facet arthropathy, with some widening of the right facets. No spinal
canal stenosis. Mild right neural foraminal narrowing.

L5-S1: Mild disc bulge. Moderate facet arthropathy. No spinal canal
stenosis. Moderate right neural foraminal narrowing.
IMPRESSION: 1. No evidence of discitis or osteomyelitis in the thoracic or
lumbar spine. No acute fracture.
2. Degenerative changes and scoliosis, which cause mild neural
foraminal narrowing bilaterally at T2-T3, on the left at L3-L4, and
on the right at L4-L5 and L5-S1. No spinal canal stenosis.
3. Pulmonary nodules in the right upper and left lower lobe, which
were not present on 11/19/2020; these are poorly visualized on MRI.
Recommend CT chest for further evaluation.

These results were called by telephone at the time of interpretation
on 06/23/2021 at [DATE] to provider DR. SUJOL, who verbally
acknowledged these results.

## 2023-11-22 IMAGING — CT CT CHEST W/ CM
2 of 3 series · 15 of 36 positions shown, 18 images · IV contrast (APPLIED)
Comparison: Most recent chest x-ray 04/15/2021. Chest CT 11/19/2020

CLINICAL DATA: Abnormal xray - lung nodule, >= 1 cm Lung nodule, >
8mm. Back pain.

EXAM:
CT CHEST WITH CONTRAST
TECHNIQUE: Multidetector CT imaging of the chest was performed during
intravenous contrast administration.

[Series 3: thorax 2.0 i31f 2 · axial · 0.62mm/px · z∈[+41,+337]mm · 12 of 174 slices shown, 15 images]
[im 13/174  mediastinal]
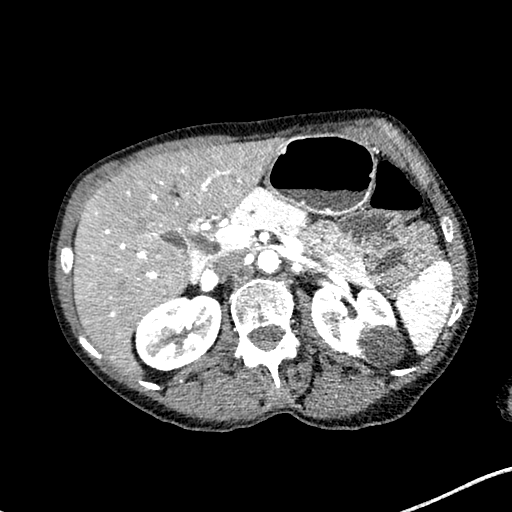
[im 13/174  lung]
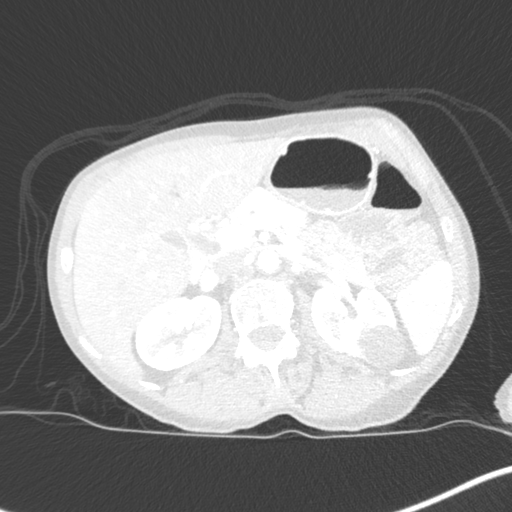
[im 26/174  lung]
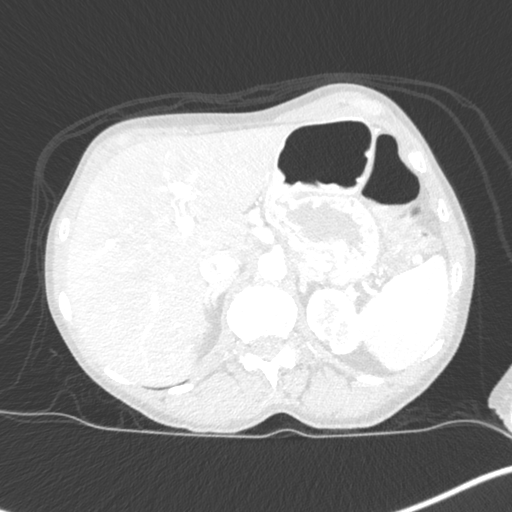
[im 39/174  lung]
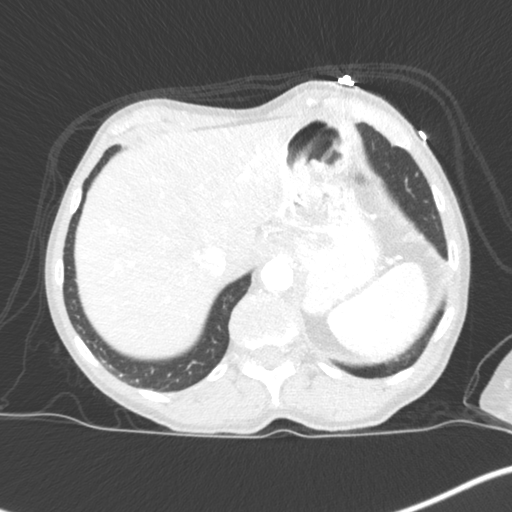
[im 52/174  lung]
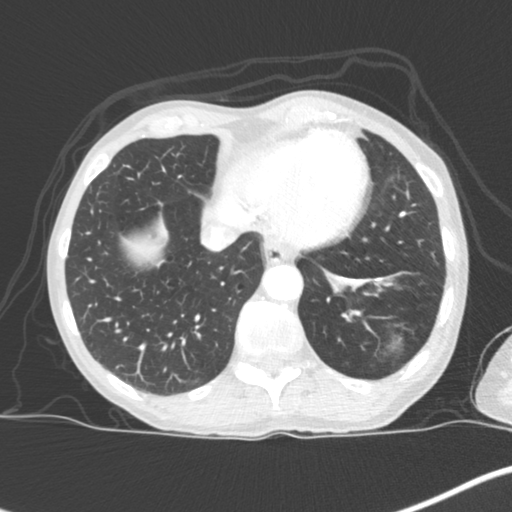
[im 65/174  mediastinal]
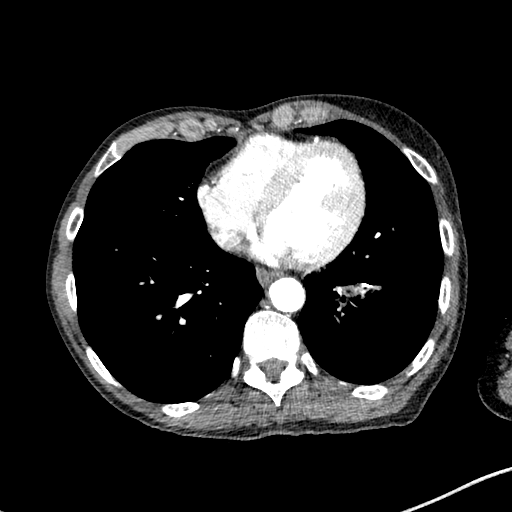
[im 65/174  lung]
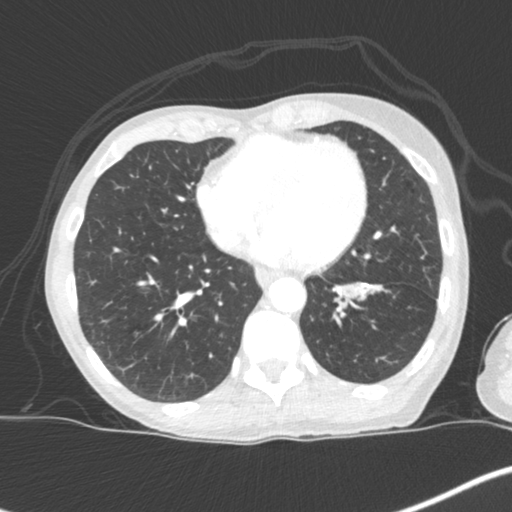
[im 77/174  lung]
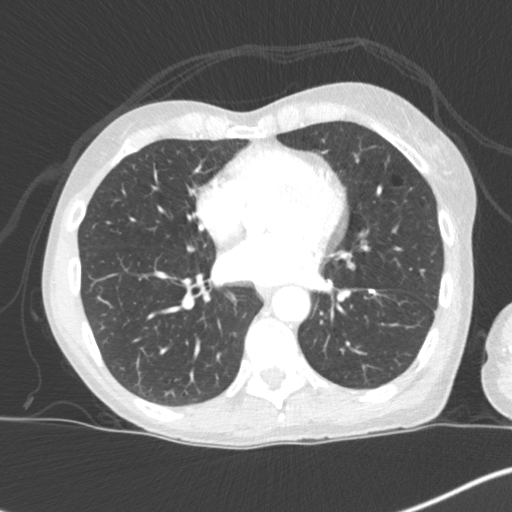
[im 97/174  lung]
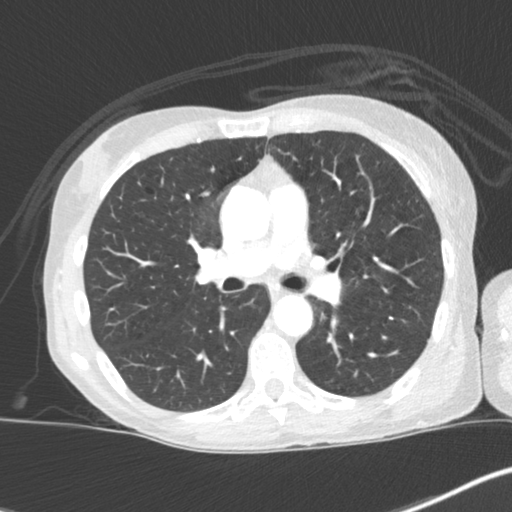
[im 109/174  lung]
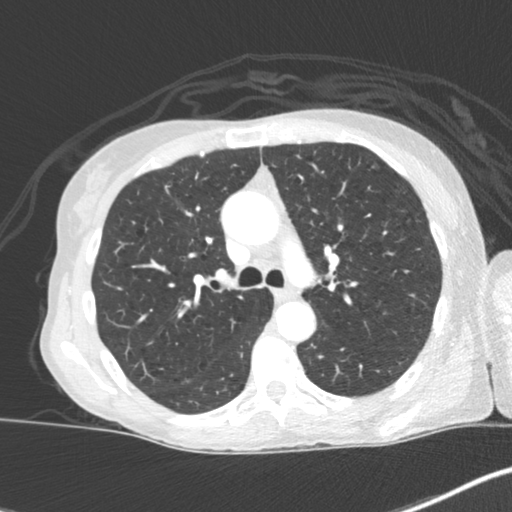
[im 122/174  mediastinal]
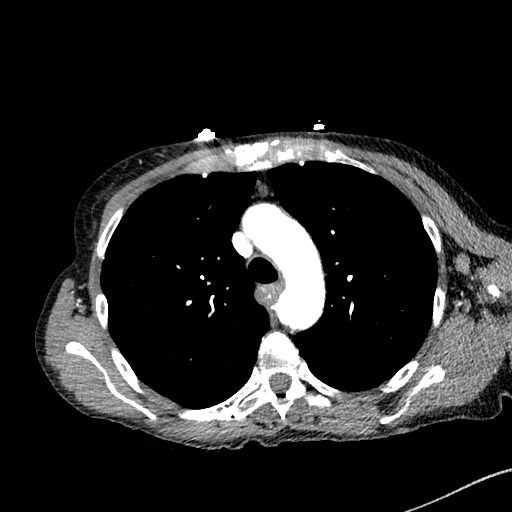
[im 122/174  lung]
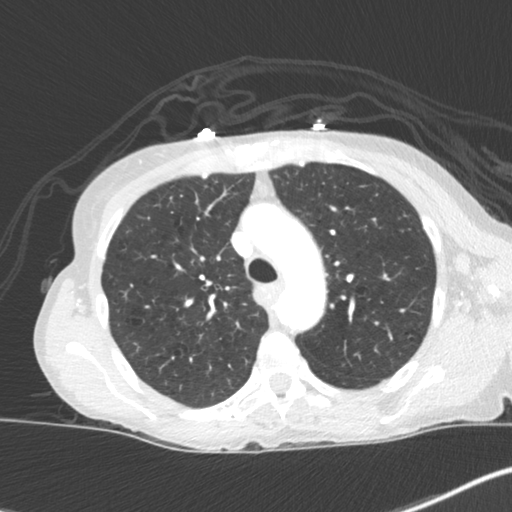
[im 135/174  lung]
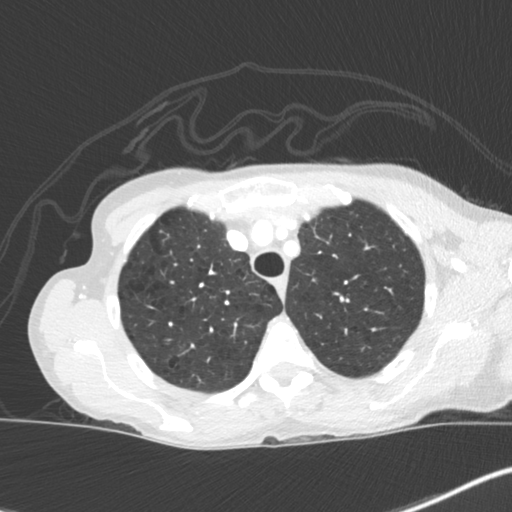
[im 148/174  lung]
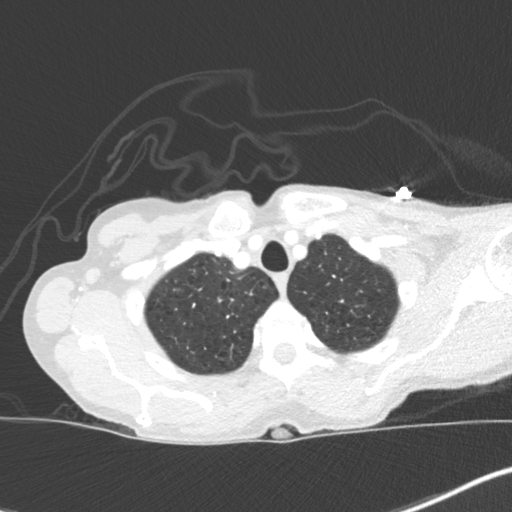
[im 161/174  lung]
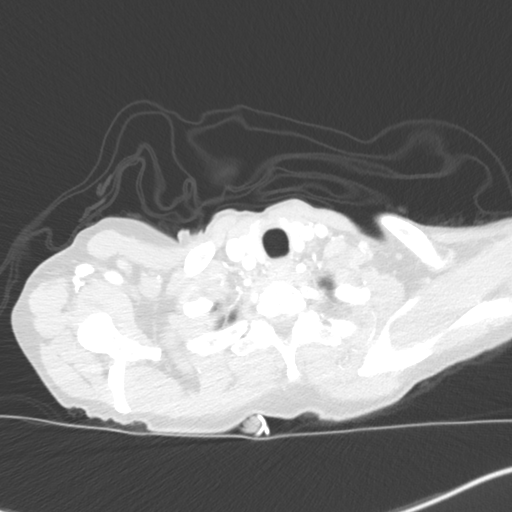

[Series 5: coronal · coronal · 0.59mm/px · 3 of 133 slices shown]
[im 27/133  lung]
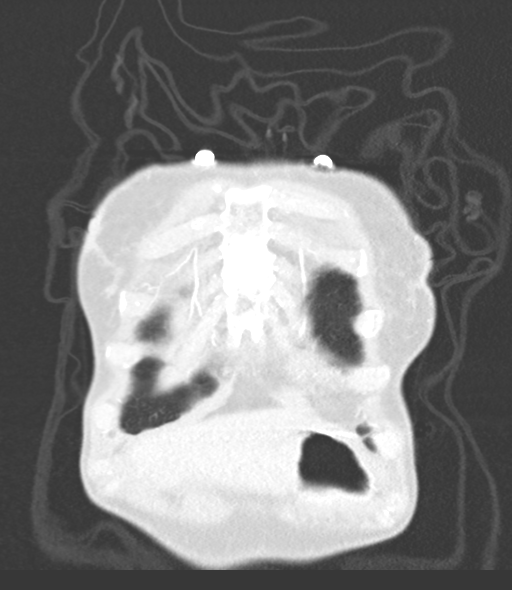
[im 53/133  lung]
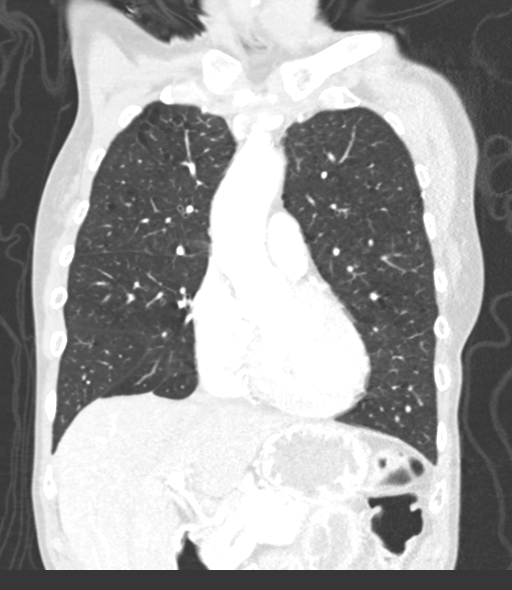
[im 80/133  lung]
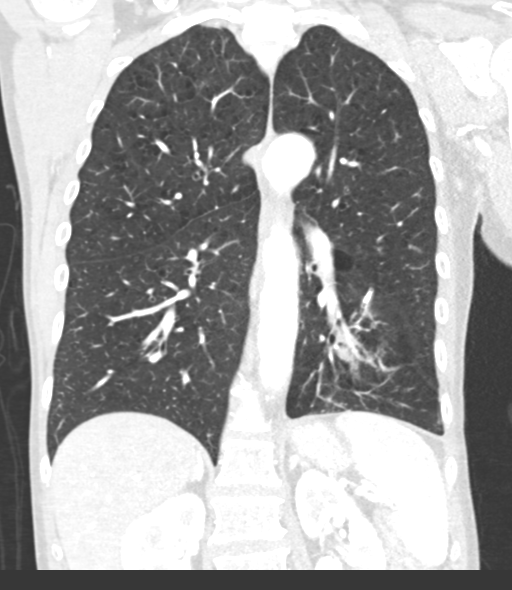

[15 of 36 positions shown; findings below may reference images not displayed]

RADIATION DOSE REDUCTION: This exam was performed according to the
departmental dose-optimization program which includes automated
exposure control, adjustment of the mA and/or kV according to
patient size and/or use of iterative reconstruction technique.

CONTRAST:  75mL OMNIPAQUE IOHEXOL 300 MG/ML  SOLN
FINDINGS: Cardiovascular: Heart is normal size. Aorta is normal caliber.
Scattered aortic atherosclerosis.

Mediastinum/Nodes: No mediastinal, hilar, or axillary adenopathy.
Trachea and esophagus are unremarkable. Thyroid unremarkable.

Lungs/Pleura: Suture line in the left lower lobe. Adjacent soft
tissue decreased overall since prior study, now currently measuring
maximally 1.6 x 1.0 cm on image 107 of series 7 compared with 3.7 x
1.9 cm previously. Nodule in the superior segment of the left lower
lobe along the fissure measures 5 mm on image 59. This is new since
prior study. Posterior right upper lobe nodule measures
approximately 7 mm in greatest diameter on image 59, new since prior
study. No effusions.

Upper Abdomen: Imaging into the upper abdomen demonstrates no acute
findings.

Musculoskeletal: Mild compression fracture in the midthoracic spine
is stable since prior study. No acute bony abnormality or focal bone
lesion.
IMPRESSION: Postoperative changes in the left lower lobe. Previously seen
airspace disease around the suture line has improved suggesting this
was likely a post infectious/inflammatory process. Residual density
around the suture could reflect scarring.

New nodules in the posterior right upper lobe and superior segment
of the left lower lobe. These are concerning for possible
metastases, but are below the size typically visible on PET CT.
Recommend close interval follow-up with repeat CT in 3-6 months to
assess these areas for change.
